# Patient Record
Sex: Female | Born: 1950 | Race: Black or African American | Hispanic: No | Smoking: Former smoker
Health system: Southern US, Community
[De-identification: ages and names within clinical notes are randomized; demographics above are authoritative.]

## PROBLEM LIST (undated history)

## (undated) DIAGNOSIS — F1911 Other psychoactive substance abuse, in remission: Secondary | ICD-10-CM

## (undated) DIAGNOSIS — G8929 Other chronic pain: Secondary | ICD-10-CM

## (undated) DIAGNOSIS — F419 Anxiety disorder, unspecified: Secondary | ICD-10-CM

## (undated) DIAGNOSIS — H269 Unspecified cataract: Secondary | ICD-10-CM

## (undated) DIAGNOSIS — K219 Gastro-esophageal reflux disease without esophagitis: Secondary | ICD-10-CM

## (undated) DIAGNOSIS — I10 Essential (primary) hypertension: Secondary | ICD-10-CM

## (undated) DIAGNOSIS — E782 Mixed hyperlipidemia: Secondary | ICD-10-CM

## (undated) DIAGNOSIS — F329 Major depressive disorder, single episode, unspecified: Secondary | ICD-10-CM

## (undated) DIAGNOSIS — Z87898 Personal history of other specified conditions: Secondary | ICD-10-CM

## (undated) DIAGNOSIS — E039 Hypothyroidism, unspecified: Secondary | ICD-10-CM

## (undated) DIAGNOSIS — F32A Depression, unspecified: Secondary | ICD-10-CM

## (undated) DIAGNOSIS — R569 Unspecified convulsions: Secondary | ICD-10-CM

## (undated) DIAGNOSIS — F309 Manic episode, unspecified: Secondary | ICD-10-CM

## (undated) DIAGNOSIS — R2689 Other abnormalities of gait and mobility: Secondary | ICD-10-CM

## (undated) DIAGNOSIS — M5416 Radiculopathy, lumbar region: Secondary | ICD-10-CM

## (undated) DIAGNOSIS — M549 Dorsalgia, unspecified: Secondary | ICD-10-CM

## (undated) DIAGNOSIS — G4730 Sleep apnea, unspecified: Secondary | ICD-10-CM

## (undated) DIAGNOSIS — M1990 Unspecified osteoarthritis, unspecified site: Secondary | ICD-10-CM

## (undated) DIAGNOSIS — E7800 Pure hypercholesterolemia, unspecified: Secondary | ICD-10-CM

## (undated) DIAGNOSIS — K5900 Constipation, unspecified: Secondary | ICD-10-CM

## (undated) DIAGNOSIS — G44009 Cluster headache syndrome, unspecified, not intractable: Secondary | ICD-10-CM

## (undated) DIAGNOSIS — F4310 Post-traumatic stress disorder, unspecified: Secondary | ICD-10-CM

## (undated) DIAGNOSIS — G894 Chronic pain syndrome: Secondary | ICD-10-CM

## (undated) HISTORY — DX: Mixed hyperlipidemia: E78.2

## (undated) HISTORY — DX: Other chronic pain: G89.29

## (undated) HISTORY — DX: Manic episode, unspecified: F30.9

## (undated) HISTORY — PX: CHOLECYSTECTOMY: SHX55

## (undated) HISTORY — DX: Personal history of other specified conditions: Z87.898

## (undated) HISTORY — DX: Unspecified cataract: H26.9

## (undated) HISTORY — PX: FOOT SURGERY: SHX648

## (undated) HISTORY — DX: Anxiety disorder, unspecified: F41.9

## (undated) HISTORY — DX: Gastro-esophageal reflux disease without esophagitis: K21.9

## (undated) HISTORY — DX: Essential (primary) hypertension: I10

## (undated) HISTORY — DX: Depression, unspecified: F32.A

## (undated) HISTORY — DX: Cluster headache syndrome, unspecified, not intractable: G44.009

## (undated) HISTORY — PX: BACK SURGERY: SHX140

## (undated) HISTORY — DX: Pure hypercholesterolemia, unspecified: E78.00

## (undated) HISTORY — PX: PARTIAL HYSTERECTOMY: SHX80

## (undated) HISTORY — PX: BILATERAL OOPHORECTOMY: SHX1221

## (undated) HISTORY — PX: OTHER SURGICAL HISTORY: SHX169

## (undated) HISTORY — DX: Other psychoactive substance abuse, in remission: F19.11

## (undated) HISTORY — DX: Dorsalgia, unspecified: M54.9

## (undated) HISTORY — DX: Post-traumatic stress disorder, unspecified: F43.10

## (undated) HISTORY — DX: Major depressive disorder, single episode, unspecified: F32.9

## (undated) HISTORY — PX: LUMBAR DISC SURGERY: SHX700

## (undated) HISTORY — DX: Hypothyroidism, unspecified: E03.9

---

## 2003-04-29 ENCOUNTER — Encounter: Payer: Self-pay | Admitting: Family Medicine

## 2003-04-29 ENCOUNTER — Ambulatory Visit (HOSPITAL_COMMUNITY): Admission: RE | Admit: 2003-04-29 | Discharge: 2003-04-29 | Payer: Self-pay | Admitting: Family Medicine

## 2003-07-13 ENCOUNTER — Ambulatory Visit (HOSPITAL_COMMUNITY): Admission: RE | Admit: 2003-07-13 | Discharge: 2003-07-13 | Payer: Self-pay | Admitting: Family Medicine

## 2003-07-16 ENCOUNTER — Inpatient Hospital Stay (HOSPITAL_COMMUNITY): Admission: AD | Admit: 2003-07-16 | Discharge: 2003-07-20 | Payer: Self-pay | Admitting: General Surgery

## 2004-04-08 ENCOUNTER — Emergency Department (HOSPITAL_COMMUNITY): Admission: EM | Admit: 2004-04-08 | Discharge: 2004-04-08 | Payer: Self-pay | Admitting: Emergency Medicine

## 2004-11-23 ENCOUNTER — Ambulatory Visit: Payer: Self-pay | Admitting: Family Medicine

## 2005-01-03 ENCOUNTER — Ambulatory Visit (HOSPITAL_COMMUNITY): Admission: RE | Admit: 2005-01-03 | Discharge: 2005-01-03 | Payer: Self-pay | Admitting: Orthopaedic Surgery

## 2005-01-04 ENCOUNTER — Ambulatory Visit: Payer: Self-pay | Admitting: Family Medicine

## 2005-02-16 ENCOUNTER — Ambulatory Visit: Payer: Self-pay | Admitting: Family Medicine

## 2005-03-01 ENCOUNTER — Ambulatory Visit: Payer: Self-pay | Admitting: Family Medicine

## 2005-03-16 ENCOUNTER — Ambulatory Visit (HOSPITAL_COMMUNITY): Admission: RE | Admit: 2005-03-16 | Discharge: 2005-03-16 | Payer: Self-pay | Admitting: Family Medicine

## 2005-05-09 ENCOUNTER — Ambulatory Visit: Payer: Self-pay | Admitting: Family Medicine

## 2005-06-18 ENCOUNTER — Emergency Department (HOSPITAL_COMMUNITY): Admission: EM | Admit: 2005-06-18 | Discharge: 2005-06-18 | Payer: Self-pay | Admitting: Emergency Medicine

## 2005-06-19 ENCOUNTER — Ambulatory Visit: Payer: Self-pay | Admitting: Family Medicine

## 2005-07-31 ENCOUNTER — Ambulatory Visit: Payer: Self-pay | Admitting: Family Medicine

## 2005-08-21 ENCOUNTER — Ambulatory Visit (HOSPITAL_COMMUNITY): Admission: RE | Admit: 2005-08-21 | Discharge: 2005-08-21 | Payer: Self-pay | Admitting: Family Medicine

## 2005-09-13 ENCOUNTER — Inpatient Hospital Stay (HOSPITAL_COMMUNITY): Admission: RE | Admit: 2005-09-13 | Discharge: 2005-09-17 | Payer: Self-pay | Admitting: Neurosurgery

## 2005-10-19 ENCOUNTER — Ambulatory Visit: Payer: Self-pay | Admitting: Family Medicine

## 2005-12-26 ENCOUNTER — Ambulatory Visit (HOSPITAL_COMMUNITY): Admission: RE | Admit: 2005-12-26 | Discharge: 2005-12-26 | Payer: Self-pay | Admitting: Neurosurgery

## 2006-02-21 ENCOUNTER — Ambulatory Visit: Payer: Self-pay | Admitting: Family Medicine

## 2006-02-22 ENCOUNTER — Ambulatory Visit (HOSPITAL_COMMUNITY): Admission: RE | Admit: 2006-02-22 | Discharge: 2006-02-22 | Payer: Self-pay | Admitting: Family Medicine

## 2006-02-23 ENCOUNTER — Ambulatory Visit (HOSPITAL_COMMUNITY): Admission: RE | Admit: 2006-02-23 | Discharge: 2006-02-23 | Payer: Self-pay | Admitting: Family Medicine

## 2006-03-21 ENCOUNTER — Ambulatory Visit (HOSPITAL_COMMUNITY): Admission: RE | Admit: 2006-03-21 | Discharge: 2006-03-21 | Payer: Self-pay | Admitting: Family Medicine

## 2006-04-11 ENCOUNTER — Encounter: Payer: Self-pay | Admitting: Family Medicine

## 2006-04-11 ENCOUNTER — Ambulatory Visit: Payer: Self-pay | Admitting: Family Medicine

## 2006-04-11 ENCOUNTER — Other Ambulatory Visit: Admission: RE | Admit: 2006-04-11 | Discharge: 2006-04-11 | Payer: Self-pay | Admitting: Family Medicine

## 2006-04-11 LAB — CONVERTED CEMR LAB: Pap Smear: NORMAL

## 2006-05-03 ENCOUNTER — Ambulatory Visit: Payer: Self-pay | Admitting: Family Medicine

## 2006-06-19 ENCOUNTER — Emergency Department (HOSPITAL_COMMUNITY): Admission: EM | Admit: 2006-06-19 | Discharge: 2006-06-19 | Payer: Self-pay | Admitting: Emergency Medicine

## 2006-06-21 ENCOUNTER — Ambulatory Visit: Payer: Self-pay | Admitting: Internal Medicine

## 2006-06-28 ENCOUNTER — Ambulatory Visit: Payer: Self-pay | Admitting: Internal Medicine

## 2006-06-28 ENCOUNTER — Encounter (INDEPENDENT_AMBULATORY_CARE_PROVIDER_SITE_OTHER): Payer: Self-pay | Admitting: Specialist

## 2006-06-28 ENCOUNTER — Ambulatory Visit (HOSPITAL_COMMUNITY): Admission: RE | Admit: 2006-06-28 | Discharge: 2006-06-28 | Payer: Self-pay | Admitting: Internal Medicine

## 2006-06-28 HISTORY — PX: COLONOSCOPY: SHX174

## 2006-06-28 HISTORY — PX: ESOPHAGOGASTRODUODENOSCOPY: SHX1529

## 2006-07-05 ENCOUNTER — Ambulatory Visit: Payer: Self-pay | Admitting: Family Medicine

## 2006-07-09 ENCOUNTER — Ambulatory Visit (HOSPITAL_COMMUNITY): Admission: RE | Admit: 2006-07-09 | Discharge: 2006-07-09 | Payer: Self-pay | Admitting: Family Medicine

## 2006-07-10 ENCOUNTER — Ambulatory Visit: Payer: Self-pay | Admitting: Cardiology

## 2006-07-11 ENCOUNTER — Inpatient Hospital Stay (HOSPITAL_COMMUNITY): Admission: EM | Admit: 2006-07-11 | Discharge: 2006-07-13 | Payer: Self-pay | Admitting: Emergency Medicine

## 2006-09-24 ENCOUNTER — Ambulatory Visit: Payer: Self-pay | Admitting: Family Medicine

## 2006-10-12 ENCOUNTER — Encounter: Payer: Self-pay | Admitting: Family Medicine

## 2006-10-12 LAB — CONVERTED CEMR LAB
ALT: 25 units/L (ref 0–35)
AST: 23 units/L (ref 0–37)
Albumin: 4.7 g/dL (ref 3.5–5.2)
Alkaline Phosphatase: 71 units/L (ref 39–117)
BUN: 10 mg/dL (ref 6–23)
Basophils Absolute: 0 10*3/uL (ref 0.0–0.1)
Basophils Relative: 0 % (ref 0–1)
Bilirubin, Direct: 0.1 mg/dL (ref 0.0–0.3)
CO2: 27 meq/L (ref 19–32)
Calcium: 9.1 mg/dL (ref 8.4–10.5)
Chloride: 102 meq/L (ref 96–112)
Cholesterol: 243 mg/dL — ABNORMAL HIGH (ref 0–200)
Creatinine, Ser: 1.16 mg/dL (ref 0.40–1.20)
Eosinophils Absolute: 0.1 10*3/uL (ref 0.0–0.7)
Eosinophils Relative: 1 % (ref 0–5)
Glucose, Bld: 114 mg/dL — ABNORMAL HIGH (ref 70–99)
HCT: 47.1 % — ABNORMAL HIGH (ref 36.0–46.0)
HDL: 58 mg/dL (ref >39)
Hemoglobin: 14.7 g/dL (ref 12.0–15.0)
Indirect Bilirubin: 0.4 mg/dL (ref 0.0–0.9)
LDL Cholesterol: 163 mg/dL — ABNORMAL HIGH (ref 0–99)
Lymphocytes Relative: 40 % (ref 12–46)
Lymphs Abs: 4 10*3/uL — ABNORMAL HIGH (ref 0.7–3.3)
MCHC: 31.2 g/dL (ref 30.0–36.0)
MCV: 85.6 fL (ref 78.0–100.0)
Monocytes Absolute: 0.4 10*3/uL (ref 0.2–0.7)
Monocytes Relative: 4 % (ref 3–11)
Neutro Abs: 5.5 10*3/uL (ref 1.7–7.7)
Neutrophils Relative %: 54 % (ref 43–77)
Platelets: 255 10*3/uL (ref 150–400)
Potassium: 3.7 meq/L (ref 3.5–5.3)
RBC: 5.5 M/uL — ABNORMAL HIGH (ref 3.87–5.11)
RDW: 14.9 % — ABNORMAL HIGH (ref 11.5–14.0)
Sodium: 141 meq/L (ref 135–145)
TSH: 21.401 microintl units/mL — ABNORMAL HIGH (ref 0.350–5.50)
Total Bilirubin: 0.5 mg/dL (ref 0.3–1.2)
Total CHOL/HDL Ratio: 4.2
Total Protein: 8.4 g/dL — ABNORMAL HIGH (ref 6.0–8.3)
Triglycerides: 109 mg/dL (ref <150)
VLDL: 22 mg/dL (ref 0–40)
WBC: 10.1 10*3/uL (ref 4.0–10.5)

## 2006-10-15 ENCOUNTER — Encounter: Payer: Self-pay | Admitting: Family Medicine

## 2006-10-15 LAB — CONVERTED CEMR LAB: Hgb A1c MFr Bld: 5.3 % (ref 4.6–6.1)

## 2006-10-18 ENCOUNTER — Ambulatory Visit: Payer: Self-pay | Admitting: Family Medicine

## 2006-11-14 ENCOUNTER — Ambulatory Visit: Payer: Self-pay | Admitting: Family Medicine

## 2006-11-27 ENCOUNTER — Ambulatory Visit: Payer: Self-pay | Admitting: Internal Medicine

## 2006-11-28 ENCOUNTER — Ambulatory Visit (HOSPITAL_COMMUNITY): Admission: RE | Admit: 2006-11-28 | Discharge: 2006-11-28 | Payer: Self-pay | Admitting: Internal Medicine

## 2006-12-11 ENCOUNTER — Encounter: Payer: Self-pay | Admitting: Family Medicine

## 2006-12-11 LAB — CONVERTED CEMR LAB: TSH: 3.45 microintl units/mL (ref 0.350–5.50)

## 2007-01-14 ENCOUNTER — Ambulatory Visit: Payer: Self-pay | Admitting: Family Medicine

## 2007-03-12 ENCOUNTER — Ambulatory Visit: Payer: Self-pay | Admitting: Psychiatry

## 2007-03-12 ENCOUNTER — Inpatient Hospital Stay (HOSPITAL_COMMUNITY): Admission: AD | Admit: 2007-03-12 | Discharge: 2007-03-15 | Payer: Self-pay | Admitting: Psychiatry

## 2007-05-06 ENCOUNTER — Ambulatory Visit: Payer: Self-pay | Admitting: Family Medicine

## 2007-05-06 LAB — CONVERTED CEMR LAB: Hgb A1c MFr Bld: 5.7 %

## 2007-05-09 ENCOUNTER — Ambulatory Visit (HOSPITAL_COMMUNITY): Admission: RE | Admit: 2007-05-09 | Discharge: 2007-05-09 | Payer: Self-pay | Admitting: Family Medicine

## 2007-05-09 ENCOUNTER — Encounter: Payer: Self-pay | Admitting: Family Medicine

## 2007-05-09 LAB — CONVERTED CEMR LAB
ALT: 18 units/L (ref 0–35)
AST: 16 units/L (ref 0–37)
Albumin: 3.7 g/dL (ref 3.5–5.2)
Alkaline Phosphatase: 80 units/L (ref 39–117)
BUN: 9 mg/dL (ref 6–23)
Bilirubin, Direct: <0.1 mg/dL (ref 0.0–0.3)
CO2: 25 meq/L (ref 19–32)
Calcium: 8.4 mg/dL (ref 8.4–10.5)
Chloride: 107 meq/L (ref 96–112)
Cholesterol: 121 mg/dL (ref 0–200)
Creatinine, Ser: 0.64 mg/dL (ref 0.40–1.20)
Glucose, Bld: 98 mg/dL (ref 70–99)
HDL: 47 mg/dL (ref >39)
LDL Cholesterol: 59 mg/dL (ref 0–99)
Potassium: 4 meq/L (ref 3.5–5.3)
Sodium: 142 meq/L (ref 135–145)
TSH: 0.018 microintl units/mL — ABNORMAL LOW (ref 0.350–5.50)
Total Bilirubin: 0.3 mg/dL (ref 0.3–1.2)
Total CHOL/HDL Ratio: 2.6
Total Protein: 6.9 g/dL (ref 6.0–8.3)
Triglycerides: 75 mg/dL (ref <150)
VLDL: 15 mg/dL (ref 0–40)

## 2007-06-10 ENCOUNTER — Ambulatory Visit (HOSPITAL_COMMUNITY): Payer: Self-pay | Admitting: Psychiatry

## 2007-06-18 ENCOUNTER — Ambulatory Visit (HOSPITAL_COMMUNITY): Payer: Self-pay | Admitting: Psychiatry

## 2007-06-24 ENCOUNTER — Ambulatory Visit: Payer: Self-pay | Admitting: Family Medicine

## 2007-06-24 LAB — CONVERTED CEMR LAB: TSH: 0.331 microintl units/mL — ABNORMAL LOW (ref 0.350–5.50)

## 2007-07-30 ENCOUNTER — Ambulatory Visit (HOSPITAL_COMMUNITY): Payer: Self-pay | Admitting: Psychiatry

## 2007-08-14 ENCOUNTER — Ambulatory Visit (HOSPITAL_COMMUNITY): Payer: Self-pay | Admitting: Psychiatry

## 2007-08-20 ENCOUNTER — Ambulatory Visit (HOSPITAL_COMMUNITY): Payer: Self-pay | Admitting: Psychiatry

## 2007-09-12 ENCOUNTER — Encounter: Payer: Self-pay | Admitting: Family Medicine

## 2007-09-19 ENCOUNTER — Ambulatory Visit (HOSPITAL_COMMUNITY): Payer: Self-pay | Admitting: Psychiatry

## 2007-09-20 ENCOUNTER — Ambulatory Visit (HOSPITAL_COMMUNITY): Payer: Self-pay | Admitting: Psychiatry

## 2007-11-14 ENCOUNTER — Ambulatory Visit (HOSPITAL_COMMUNITY): Payer: Self-pay | Admitting: Psychiatry

## 2008-01-14 ENCOUNTER — Ambulatory Visit (HOSPITAL_COMMUNITY): Payer: Self-pay | Admitting: Psychiatry

## 2008-02-24 ENCOUNTER — Ambulatory Visit: Payer: Self-pay | Admitting: Family Medicine

## 2008-02-24 LAB — CONVERTED CEMR LAB
ALT: 13 units/L (ref 0–35)
AST: 15 units/L (ref 0–37)
Albumin: 3.9 g/dL (ref 3.5–5.2)
Alkaline Phosphatase: 67 units/L (ref 39–117)
BUN: 18 mg/dL (ref 6–23)
Basophils Absolute: 0 10*3/uL (ref 0.0–0.1)
Basophils Relative: 0 % (ref 0–1)
Bilirubin, Direct: <0.1 mg/dL (ref 0.0–0.3)
Blood Glucose, Fasting: 115 mg/dL
CO2: 25 meq/L (ref 19–32)
Calcium: 9 mg/dL (ref 8.4–10.5)
Chloride: 105 meq/L (ref 96–112)
Cholesterol: 173 mg/dL (ref 0–200)
Creatinine, Ser: 0.78 mg/dL (ref 0.40–1.20)
Eosinophils Absolute: 0.1 10*3/uL (ref 0.0–0.7)
Eosinophils Relative: 1 % (ref 0–5)
Glucose, Bld: 115 mg/dL — ABNORMAL HIGH (ref 70–99)
HCT: 38.8 % (ref 36.0–46.0)
HDL: 46 mg/dL (ref >39)
Hemoglobin: 12.4 g/dL (ref 12.0–15.0)
LDL Cholesterol: 90 mg/dL (ref 0–99)
Lymphocytes Relative: 49 % — ABNORMAL HIGH (ref 12–46)
Lymphs Abs: 2.6 10*3/uL (ref 0.7–4.0)
MCHC: 32 g/dL (ref 30.0–36.0)
MCV: 84.3 fL (ref 78.0–100.0)
Monocytes Absolute: 0.4 10*3/uL (ref 0.1–1.0)
Monocytes Relative: 7 % (ref 3–12)
Neutro Abs: 2.3 10*3/uL (ref 1.7–7.7)
Neutrophils Relative %: 43 % (ref 43–77)
Platelets: 198 10*3/uL (ref 150–400)
Potassium: 3.7 meq/L (ref 3.5–5.3)
RBC count: 4.6 10*6/uL
RBC: 4.6 M/uL (ref 3.87–5.11)
RDW: 14.2 % (ref 11.5–15.5)
Sodium: 140 meq/L (ref 135–145)
TSH: 1.451 microintl units/mL
TSH: 1.451 microintl units/mL (ref 0.350–5.50)
Total Bilirubin: 0.3 mg/dL (ref 0.3–1.2)
Total CHOL/HDL Ratio: 3.8
Total Protein: 7.3 g/dL (ref 6.0–8.3)
Triglycerides: 185 mg/dL — ABNORMAL HIGH (ref <150)
VLDL: 37 mg/dL (ref 0–40)
WBC, blood: 5.3 10*3/uL
WBC: 5.3 10*3/uL (ref 4.0–10.5)

## 2008-02-25 ENCOUNTER — Encounter: Payer: Self-pay | Admitting: Family Medicine

## 2008-02-25 LAB — CONVERTED CEMR LAB: Hgb A1c MFr Bld: 5.3 % (ref 4.6–6.1)

## 2008-02-27 ENCOUNTER — Encounter: Payer: Self-pay | Admitting: Family Medicine

## 2008-02-27 DIAGNOSIS — F3130 Bipolar disorder, current episode depressed, mild or moderate severity, unspecified: Secondary | ICD-10-CM | POA: Insufficient documentation

## 2008-02-27 DIAGNOSIS — E785 Hyperlipidemia, unspecified: Secondary | ICD-10-CM | POA: Insufficient documentation

## 2008-02-27 DIAGNOSIS — J309 Allergic rhinitis, unspecified: Secondary | ICD-10-CM | POA: Insufficient documentation

## 2008-02-27 DIAGNOSIS — R51 Headache: Secondary | ICD-10-CM | POA: Insufficient documentation

## 2008-02-27 DIAGNOSIS — F418 Other specified anxiety disorders: Secondary | ICD-10-CM | POA: Insufficient documentation

## 2008-02-27 DIAGNOSIS — R519 Headache, unspecified: Secondary | ICD-10-CM | POA: Insufficient documentation

## 2008-02-27 DIAGNOSIS — I10 Essential (primary) hypertension: Secondary | ICD-10-CM | POA: Insufficient documentation

## 2008-02-27 DIAGNOSIS — K219 Gastro-esophageal reflux disease without esophagitis: Secondary | ICD-10-CM | POA: Insufficient documentation

## 2008-02-27 DIAGNOSIS — E039 Hypothyroidism, unspecified: Secondary | ICD-10-CM | POA: Insufficient documentation

## 2008-02-27 DIAGNOSIS — F411 Generalized anxiety disorder: Secondary | ICD-10-CM

## 2008-03-17 ENCOUNTER — Ambulatory Visit (HOSPITAL_COMMUNITY): Payer: Self-pay | Admitting: Psychiatry

## 2008-07-13 ENCOUNTER — Encounter: Payer: Self-pay | Admitting: Family Medicine

## 2008-07-16 ENCOUNTER — Ambulatory Visit (HOSPITAL_COMMUNITY): Payer: Self-pay | Admitting: Psychiatry

## 2008-07-29 ENCOUNTER — Ambulatory Visit (HOSPITAL_COMMUNITY): Payer: Self-pay | Admitting: Psychiatry

## 2008-08-13 ENCOUNTER — Ambulatory Visit (HOSPITAL_COMMUNITY): Payer: Self-pay | Admitting: Psychiatry

## 2008-08-26 ENCOUNTER — Ambulatory Visit (HOSPITAL_COMMUNITY): Payer: Self-pay | Admitting: Psychiatry

## 2008-09-09 ENCOUNTER — Ambulatory Visit: Payer: Self-pay | Admitting: Family Medicine

## 2008-09-09 DIAGNOSIS — E739 Lactose intolerance, unspecified: Secondary | ICD-10-CM | POA: Insufficient documentation

## 2008-09-09 DIAGNOSIS — IMO0002 Reserved for concepts with insufficient information to code with codable children: Secondary | ICD-10-CM

## 2008-09-09 DIAGNOSIS — G8929 Other chronic pain: Secondary | ICD-10-CM | POA: Insufficient documentation

## 2008-09-09 LAB — CONVERTED CEMR LAB: Hgb A1c MFr Bld: 5.5 %

## 2008-09-14 LAB — CONVERTED CEMR LAB
ALT: 16 units/L (ref 0–35)
AST: 18 units/L (ref 0–37)
Albumin: 3.9 g/dL (ref 3.5–5.2)
Alkaline Phosphatase: 61 units/L (ref 39–117)
BUN: 14 mg/dL (ref 6–23)
Bilirubin, Direct: 0.1 mg/dL (ref 0.0–0.3)
CO2: 26 meq/L (ref 19–32)
Calcium: 9.2 mg/dL (ref 8.4–10.5)
Chloride: 103 meq/L (ref 96–112)
Cholesterol: 182 mg/dL (ref 0–200)
Creatinine, Ser: 0.87 mg/dL (ref 0.40–1.20)
Glucose, Bld: 94 mg/dL (ref 70–99)
HDL: 51 mg/dL (ref >39)
Indirect Bilirubin: 0.3 mg/dL (ref 0.0–0.9)
LDL Cholesterol: 102 mg/dL — ABNORMAL HIGH (ref 0–99)
Potassium: 4.1 meq/L (ref 3.5–5.3)
Sodium: 141 meq/L (ref 135–145)
TSH: 7.199 microintl units/mL — ABNORMAL HIGH (ref 0.350–4.50)
Total Bilirubin: 0.4 mg/dL (ref 0.3–1.2)
Total CHOL/HDL Ratio: 3.6
Total Protein: 7.4 g/dL (ref 6.0–8.3)
Triglycerides: 144 mg/dL (ref <150)
VLDL: 29 mg/dL (ref 0–40)

## 2008-09-15 ENCOUNTER — Ambulatory Visit (HOSPITAL_COMMUNITY): Payer: Self-pay | Admitting: Psychiatry

## 2008-09-24 ENCOUNTER — Encounter: Payer: Self-pay | Admitting: Family Medicine

## 2008-09-29 ENCOUNTER — Ambulatory Visit: Payer: Self-pay | Admitting: Internal Medicine

## 2008-10-16 ENCOUNTER — Ambulatory Visit: Payer: Self-pay | Admitting: Internal Medicine

## 2008-10-16 ENCOUNTER — Ambulatory Visit (HOSPITAL_COMMUNITY): Admission: RE | Admit: 2008-10-16 | Discharge: 2008-10-16 | Payer: Self-pay | Admitting: Internal Medicine

## 2008-10-16 HISTORY — PX: COLONOSCOPY: SHX174

## 2008-10-16 HISTORY — PX: ESOPHAGOGASTRODUODENOSCOPY: SHX1529

## 2008-10-19 ENCOUNTER — Encounter: Payer: Self-pay | Admitting: Family Medicine

## 2008-10-21 ENCOUNTER — Other Ambulatory Visit: Admission: RE | Admit: 2008-10-21 | Discharge: 2008-10-21 | Payer: Self-pay | Admitting: Family Medicine

## 2008-10-21 ENCOUNTER — Encounter: Payer: Self-pay | Admitting: Family Medicine

## 2008-10-21 ENCOUNTER — Ambulatory Visit: Payer: Self-pay | Admitting: Family Medicine

## 2008-10-21 DIAGNOSIS — L259 Unspecified contact dermatitis, unspecified cause: Secondary | ICD-10-CM | POA: Insufficient documentation

## 2008-11-12 ENCOUNTER — Ambulatory Visit: Admission: RE | Admit: 2008-11-12 | Discharge: 2008-11-12 | Payer: Self-pay | Admitting: Neurology

## 2008-12-15 ENCOUNTER — Encounter: Payer: Self-pay | Admitting: Family Medicine

## 2009-01-12 ENCOUNTER — Ambulatory Visit (HOSPITAL_COMMUNITY): Payer: Self-pay | Admitting: Psychiatry

## 2009-03-09 ENCOUNTER — Ambulatory Visit (HOSPITAL_COMMUNITY): Payer: Self-pay | Admitting: Psychiatry

## 2009-03-10 ENCOUNTER — Ambulatory Visit: Payer: Self-pay | Admitting: Family Medicine

## 2009-03-10 DIAGNOSIS — N3000 Acute cystitis without hematuria: Secondary | ICD-10-CM | POA: Insufficient documentation

## 2009-03-10 LAB — CONVERTED CEMR LAB
Bilirubin Urine: NEGATIVE
Blood in Urine, dipstick: NEGATIVE
Glucose, Urine, Semiquant: NEGATIVE
Ketones, urine, test strip: NEGATIVE
Nitrite: NEGATIVE
Specific Gravity, Urine: 1.02
Urobilinogen, UA: 0.2
pH: 6.5

## 2009-03-11 ENCOUNTER — Encounter: Payer: Self-pay | Admitting: Family Medicine

## 2009-03-11 LAB — CONVERTED CEMR LAB
ALT: 15 units/L (ref 0–35)
AST: 19 units/L (ref 0–37)
Albumin: 4.2 g/dL (ref 3.5–5.2)
Alkaline Phosphatase: 62 units/L (ref 39–117)
BUN: 12 mg/dL (ref 6–23)
Bilirubin, Direct: 0.1 mg/dL (ref 0.0–0.3)
CO2: 24 meq/L (ref 19–32)
Calcium: 8.5 mg/dL (ref 8.4–10.5)
Chloride: 106 meq/L (ref 96–112)
Cholesterol: 97 mg/dL (ref 0–200)
Creatinine, Ser: 0.79 mg/dL (ref 0.40–1.20)
Glucose, Bld: 94 mg/dL (ref 70–99)
HDL: 48 mg/dL (ref >39)
Indirect Bilirubin: 0.3 mg/dL (ref 0.0–0.9)
LDL Cholesterol: 29 mg/dL (ref 0–99)
Potassium: 4 meq/L (ref 3.5–5.3)
Sodium: 143 meq/L (ref 135–145)
TSH: 0.48 microintl units/mL (ref 0.350–4.500)
Total Bilirubin: 0.4 mg/dL (ref 0.3–1.2)
Total CHOL/HDL Ratio: 2
Total Protein: 7.5 g/dL (ref 6.0–8.3)
Triglycerides: 98 mg/dL (ref <150)
VLDL: 20 mg/dL (ref 0–40)

## 2009-05-26 ENCOUNTER — Ambulatory Visit: Payer: Self-pay | Admitting: Family Medicine

## 2009-05-26 DIAGNOSIS — M79609 Pain in unspecified limb: Secondary | ICD-10-CM | POA: Insufficient documentation

## 2009-06-22 ENCOUNTER — Ambulatory Visit (HOSPITAL_COMMUNITY): Payer: Self-pay | Admitting: Psychiatry

## 2009-06-29 ENCOUNTER — Telehealth: Payer: Self-pay | Admitting: Family Medicine

## 2009-07-12 ENCOUNTER — Ambulatory Visit: Payer: Self-pay | Admitting: Family Medicine

## 2009-07-12 LAB — CONVERTED CEMR LAB
Bilirubin Urine: NEGATIVE
Blood in Urine, dipstick: NEGATIVE
Glucose, Urine, Semiquant: NEGATIVE
Ketones, urine, test strip: NEGATIVE
Nitrite: NEGATIVE
Protein, U semiquant: 30
Specific Gravity, Urine: 1.025
Urobilinogen, UA: 4
pH: 7

## 2009-07-13 ENCOUNTER — Encounter: Payer: Self-pay | Admitting: Family Medicine

## 2009-07-13 LAB — CONVERTED CEMR LAB
ALT: 14 units/L (ref 0–35)
AST: 19 units/L (ref 0–37)
Albumin: 4.2 g/dL (ref 3.5–5.2)
Alkaline Phosphatase: 74 units/L (ref 39–117)
BUN: 12 mg/dL (ref 6–23)
Basophils Absolute: 0 10*3/uL (ref 0.0–0.1)
Basophils Relative: 0 % (ref 0–1)
Bilirubin, Direct: 0.1 mg/dL (ref 0.0–0.3)
CO2: 27 meq/L (ref 19–32)
Calcium: 9.2 mg/dL (ref 8.4–10.5)
Chloride: 102 meq/L (ref 96–112)
Cholesterol: 138 mg/dL (ref 0–200)
Creatinine, Ser: 0.67 mg/dL (ref 0.40–1.20)
Eosinophils Absolute: 0.1 10*3/uL (ref 0.0–0.7)
Eosinophils Relative: 1 % (ref 0–5)
Glucose, Bld: 110 mg/dL — ABNORMAL HIGH (ref 70–99)
HCT: 39.9 % (ref 36.0–46.0)
HDL: 49 mg/dL (ref >39)
Hemoglobin: 12.5 g/dL (ref 12.0–15.0)
Hgb A1c MFr Bld: 5.7 % (ref 4.6–6.1)
Indirect Bilirubin: 0.3 mg/dL (ref 0.0–0.9)
LDL Cholesterol: 71 mg/dL (ref 0–99)
Lymphocytes Relative: 37 % (ref 12–46)
Lymphs Abs: 2.2 10*3/uL (ref 0.7–4.0)
MCHC: 31.3 g/dL (ref 30.0–36.0)
MCV: 84.9 fL (ref 78.0–100.0)
Monocytes Absolute: 0.5 10*3/uL (ref 0.1–1.0)
Monocytes Relative: 9 % (ref 3–12)
Neutro Abs: 3.1 10*3/uL (ref 1.7–7.7)
Neutrophils Relative %: 53 % (ref 43–77)
Platelets: 175 10*3/uL (ref 150–400)
Potassium: 3.7 meq/L (ref 3.5–5.3)
RBC: 4.7 M/uL (ref 3.87–5.11)
RDW: 13.8 % (ref 11.5–15.5)
Sodium: 141 meq/L (ref 135–145)
TSH: 0.434 microintl units/mL (ref 0.350–4.500)
Total Bilirubin: 0.4 mg/dL (ref 0.3–1.2)
Total CHOL/HDL Ratio: 2.8
Total Protein: 7.5 g/dL (ref 6.0–8.3)
Triglycerides: 90 mg/dL (ref <150)
VLDL: 18 mg/dL (ref 0–40)
WBC: 5.9 10*3/uL (ref 4.0–10.5)

## 2009-07-14 ENCOUNTER — Ambulatory Visit (HOSPITAL_COMMUNITY): Payer: Self-pay | Admitting: Psychiatry

## 2009-07-18 DIAGNOSIS — N39498 Other specified urinary incontinence: Secondary | ICD-10-CM | POA: Insufficient documentation

## 2009-07-22 ENCOUNTER — Ambulatory Visit (HOSPITAL_COMMUNITY): Payer: Self-pay | Admitting: Psychiatry

## 2009-08-13 ENCOUNTER — Ambulatory Visit (HOSPITAL_COMMUNITY): Payer: Self-pay | Admitting: Psychiatry

## 2009-08-19 ENCOUNTER — Ambulatory Visit (HOSPITAL_COMMUNITY): Payer: Self-pay | Admitting: Psychiatry

## 2009-09-29 ENCOUNTER — Encounter: Payer: Self-pay | Admitting: Family Medicine

## 2009-09-30 ENCOUNTER — Ambulatory Visit (HOSPITAL_COMMUNITY): Payer: Self-pay | Admitting: Psychiatry

## 2009-10-19 ENCOUNTER — Ambulatory Visit (HOSPITAL_COMMUNITY): Payer: Self-pay | Admitting: Psychiatry

## 2009-11-01 ENCOUNTER — Telehealth: Payer: Self-pay | Admitting: Family Medicine

## 2009-11-04 ENCOUNTER — Encounter (INDEPENDENT_AMBULATORY_CARE_PROVIDER_SITE_OTHER): Payer: Self-pay | Admitting: *Deleted

## 2009-11-05 ENCOUNTER — Telehealth: Payer: Self-pay | Admitting: Family Medicine

## 2009-11-25 ENCOUNTER — Ambulatory Visit (HOSPITAL_COMMUNITY): Payer: Self-pay | Admitting: Psychiatry

## 2009-12-07 ENCOUNTER — Ambulatory Visit: Payer: Self-pay | Admitting: Family Medicine

## 2009-12-07 ENCOUNTER — Other Ambulatory Visit: Admission: RE | Admit: 2009-12-07 | Discharge: 2009-12-07 | Payer: Self-pay | Admitting: Family Medicine

## 2009-12-07 LAB — CONVERTED CEMR LAB
Bilirubin Urine: NEGATIVE
Glucose, Urine, Semiquant: NEGATIVE
Nitrite: NEGATIVE
Pap Smear: NEGATIVE
Protein, U semiquant: NEGATIVE
Specific Gravity, Urine: 1.02
Urobilinogen, UA: 4
pH: 7

## 2009-12-08 ENCOUNTER — Encounter: Payer: Self-pay | Admitting: Family Medicine

## 2009-12-10 ENCOUNTER — Encounter: Payer: Self-pay | Admitting: Family Medicine

## 2009-12-14 ENCOUNTER — Ambulatory Visit (HOSPITAL_COMMUNITY): Admission: RE | Admit: 2009-12-14 | Discharge: 2009-12-14 | Payer: Self-pay | Admitting: Family Medicine

## 2009-12-14 DIAGNOSIS — J209 Acute bronchitis, unspecified: Secondary | ICD-10-CM | POA: Insufficient documentation

## 2009-12-17 ENCOUNTER — Telehealth: Payer: Self-pay | Admitting: Family Medicine

## 2009-12-21 ENCOUNTER — Ambulatory Visit: Payer: Self-pay | Admitting: Family Medicine

## 2009-12-21 DIAGNOSIS — L509 Urticaria, unspecified: Secondary | ICD-10-CM | POA: Insufficient documentation

## 2009-12-21 DIAGNOSIS — J019 Acute sinusitis, unspecified: Secondary | ICD-10-CM | POA: Insufficient documentation

## 2009-12-22 ENCOUNTER — Telehealth: Payer: Self-pay | Admitting: Physician Assistant

## 2009-12-25 ENCOUNTER — Emergency Department (HOSPITAL_COMMUNITY): Admission: EM | Admit: 2009-12-25 | Discharge: 2009-12-25 | Payer: Self-pay | Admitting: Emergency Medicine

## 2010-01-10 ENCOUNTER — Ambulatory Visit: Payer: Self-pay | Admitting: Family Medicine

## 2010-01-10 ENCOUNTER — Telehealth: Payer: Self-pay | Admitting: Family Medicine

## 2010-01-10 LAB — CONVERTED CEMR LAB
Bilirubin Urine: NEGATIVE
Blood in Urine, dipstick: NEGATIVE
Glucose, Urine, Semiquant: NEGATIVE
Ketones, urine, test strip: NEGATIVE
Nitrite: NEGATIVE
Protein, U semiquant: NEGATIVE
Specific Gravity, Urine: 1.02
Urobilinogen, UA: 0.2
pH: 6

## 2010-01-11 ENCOUNTER — Encounter: Payer: Self-pay | Admitting: Family Medicine

## 2010-04-13 ENCOUNTER — Telehealth: Payer: Self-pay | Admitting: Family Medicine

## 2010-09-26 ENCOUNTER — Telehealth: Payer: Self-pay | Admitting: Family Medicine

## 2010-10-02 ENCOUNTER — Encounter: Payer: Self-pay | Admitting: Family Medicine

## 2010-10-02 ENCOUNTER — Encounter: Payer: Self-pay | Admitting: Neurosurgery

## 2010-10-11 NOTE — Progress Notes (Signed)
  Phone Note Call from Patient   Summary of Call: Insurance will not pay for Detrol LA 4mg . Please change to Oxybutnin ER, Vesicare, Oxytrol, Or Enablex Initial call taken by: Everitt Amber LPN,  November 05, 2009 1:17 PM  Follow-up for Phone Call        script changed to oxybutynin pls stamp and fax to d/c thje detrol and notify pt if possible also Follow-up by: Syliva Overman MD,  November 05, 2009 1:33 PM  Additional Follow-up for Phone Call Additional follow up Details #1::        d/c order sent  Additional Follow-up by: Everitt Amber LPN,  November 08, 2009 9:34 AM    New/Updated Medications: OXYBUTYNIN CHLORIDE 5 MG TABS (OXYBUTYNIN CHLORIDE) Take 1 tablet by mouth two times a day Prescriptions: OXYBUTYNIN CHLORIDE 5 MG TABS (OXYBUTYNIN CHLORIDE) Take 1 tablet by mouth two times a day  #60 x 3   Entered and Authorized by:   Syliva Overman MD   Signed by:   Syliva Overman MD on 11/05/2009   Method used:   Printed then faxed to ...       [facility] (retail)       [address]       [place], [place]  [address]       Ph: [phone]       Fax: [phone]   RxID:   [id number]

## 2010-10-11 NOTE — Progress Notes (Signed)
[facility]: [facility]   Imported By: Lind Guest 09/29/2009 08:34:28  _____________________________________________________________________  External Attachment:    Type:   Image     Comment:   External Document

## 2010-10-11 NOTE — Assessment & Plan Note (Signed)
Summary: [facility]ASH- room 1   Vital Signs:  Patient profile:   60 year old female Menstrual status:  hysterectomy Height:      63 inches Weight:      242.25 pounds BMI:     43.07 O2 Sat:      89 % on Room air Pulse rate:   89 / minute Resp:     16 per minute BP sitting:   100 / 60  (left arm)  Vitals Entered By: Adella Hare LPN (December 21, 2009 11:12 AM) CC: red raised rash all over, itches Is Patient Diabetic? No Pain Assessment Patient in pain? no        CC:  red raised rash all over and itches.  History of Present Illness: Pt here today with an itchy rash.  This started yesterday & was worse when she awoke this am.  She has been taking PCN for sinus congestion.  Has a couple doses left.  No difficulty breathing or throat swelling.  Has not tried taking anything for this. Nor using topical cream.  States her sinus congestion is better but not completely gone.  No fever or chills.  Cough is better.  Denies chest congestion today.  No ear pain or sore thrt.      Current Medications (verified): 1)  Klor-Con M20 20 Meq  Tbcr (Potassium Chloride Crys Cr) .... Take One Tablet By Mouth Once A Day 2)  Crestor 40 Mg  Tabs (Rosuvastatin Calcium) .... Take Oe Tablet By Mouth Once A Day 3)  Miralax   Powd (Polyethylene Glycol 3350) .... As Directed 4)  Fluticasone Propionate 50 Mcg/act  Susp (Fluticasone Propionate) .... Inhale One Puff in Each Nostril Twice A Day 5)  Oscal 500/200 D-3 500-200 Mg-Unit Tabs (Calcium-Vitamin D) .... One Tab By Mouth Tid 6)  Gabapentin 300 Mg Caps (Gabapentin) .... Take 1 Tablet By Mouth Once A Day 7)  Flexeril 10 Mg Tabs (Cyclobenzaprine Hcl) .... Take 1 Tab By Mouth At Bedtime 8)  Vicodin Es 7.5-750 Mg Tabs (Hydrocodone-Acetaminophen) .... Take 1 Tab By Mouth At Bedtime 9)  Prozac 10 Mg Tabs (Fluoxetine Hcl) .... Take 1 Tablet By Mouth Once A Day 10)  Catapres 0.2 Mg Tabs (Clonidine Hcl) .... Take 1 Tab By Mouth At Bedtime 11)  Omeprazole 20 Mg Tbec  (Omeprazole) .... Take 1 Tablet By Mouth Once A Day 12)  Levothroid 150 Mcg Tabs (Levothyroxine Sodium) .... One and Half Tabs By Mouth  Every Mon - Thurs and One Tab By Mouth On Fri,sat,sun 13)  Amitriptyline Hcl 100 Mg Tabs (Amitriptyline Hcl) .Marland Kitchen.. 1-2 Tabs At Bedtime 14)  Depakote Er 250 Mg Xr24h-Tab (Divalproex Sodium) .... Take 1 Tab By Mouth At Bedtime 15)  Oxybutynin Chloride 5 Mg Tabs (Oxybutynin Chloride) .... Take 1 Tablet By Mouth Two Times A Day 16)  Tessalon Perles 100 Mg Caps (Benzonatate) .... Take 1 Capsule By Mouth Three Times A Day 17)  Penicillin V Potassium 500 Mg Tabs (Penicillin V Potassium) .... Take 1 Tablet By Mouth Three Times A Day 18)  Vitamin D (Ergocalciferol) 50000 Unit Caps (Ergocalciferol) .... One Cap By Mouth Every Week  Allergies (verified): 1)  ! Sulfa 2)  ! Neosporin 3)  ! Pcn  Past History:  Past medical history reviewed for relevance to current acute and chronic problems.  Past Medical History: Reviewed history from 02/27/2008 and no changes required. Allergic rhinitis Anxiety Depression GERD Headache Hyperlipidemia Hypertension Hypothyroidism Chronic back pain  Review of Systems General:  Denies chills and fever. Eyes:  Denies discharge and itching. ENT:  Complains of nasal congestion; denies earache, sinus pressure, and sore throat. CV:  Denies chest pain or discomfort. Resp:  Denies cough, shortness of breath, sputum productive, and wheezing. Derm:  Complains of itching and rash.  Physical Exam  General:  alert, well-developed, well-nourished, well-hydrated, and cooperative to examination.   Head:  Normocephalic and atraumatic without obvious abnormalities. No apparent alopecia or balding. Eyes:  pupils equal, pupils round, pupils reactive to light, and no injection.  Hives noted Rt lateral eye area, including lower lid. Ears:  External ear exam shows no significant lesions or deformities.  Otoscopic examination reveals clear  canals, tympanic membranes are intact bilaterally without bulging, retraction, inflammation or discharge. Hearing is grossly normal bilaterally. Nose:  no external deformity, no mucosal edema, and mucosal erythema.  Yellowish dried mucus bilat nares. Mouth:  Oral mucosa and oropharynx without lesions or exudates.  Neck:  No deformities, masses, or tenderness noted. Lungs:  Normal respiratory effort, chest expands symmetrically. Lungs are clear to auscultation, no crackles or wheezes. Heart:  Normal rate and regular rhythm. S1 and S2 normal without gallop, murmur, click, rub or other extra sounds. Skin:  Lg erythemtous irrg welts noted thorax & UE bilat. Smaller similar lesions seen face & neck. Cervical Nodes:  No lymphadenopathy noted Psych:  Cognition and judgment appear intact. Alert and cooperative with normal attention span and concentration. No apparent delusions, illusions, hallucinations   Impression & Recommendations:  Problem # 1:  URTICARIA (ICD-708.9) Assessment New  Allergic rxn to PCN. Advised pt to d/c this antibiotics & to remember that she is allergic to this in the future. Chart also updated with PCN allergy.  Orders: Depo- Medrol 80mg  (J1040) Admin of Therapeutic Inj  intramuscular or subcutaneous (04540)  Problem # 2:  SINUSITIS, ACUTE (ICD-461.9)  The following medications were removed from the medication list:    Penicillin V Potassium 500 Mg Tabs (Penicillin v potassium) .Marland Kitchen... Take 1 tablet by mouth three times a day Her updated medication list for this problem includes:    Fluticasone Propionate 50 Mcg/act Susp (Fluticasone propionate) ..... Inhale one puff in each nostril twice a day    Tessalon Perles 100 Mg Caps (Benzonatate) .Marland Kitchen... Take 1 capsule by mouth three times a day    Zithromax Z-pak 250 Mg Tabs (Azithromycin) .Marland Kitchen... Take once daily as directed  Complete Medication List: 1)  Klor-con M20 20 Meq Tbcr (Potassium chloride crys cr) .... Take one tablet  by mouth once a day 2)  Crestor 40 Mg Tabs (Rosuvastatin calcium) .... Take oe tablet by mouth once a day 3)  Miralax Powd (Polyethylene glycol 3350) .... As directed 4)  Fluticasone Propionate 50 Mcg/act Susp (Fluticasone propionate) .... Inhale one puff in each nostril twice a day 5)  Oscal 500/200 D-3 500-200 Mg-unit Tabs (Calcium-vitamin d) .... One tab by mouth tid 6)  Gabapentin 300 Mg Caps (Gabapentin) .... Take 1 tablet by mouth once a day 7)  Flexeril 10 Mg Tabs (Cyclobenzaprine hcl) .... Take 1 tab by mouth at bedtime 8)  Vicodin Es 7.5-750 Mg Tabs (Hydrocodone-acetaminophen) .... Take 1 tab by mouth at bedtime 9)  Prozac 10 Mg Tabs (Fluoxetine hcl) .... Take 1 tablet by mouth once a day 10)  Catapres 0.2 Mg Tabs (Clonidine hcl) .... Take 1 tab by mouth at bedtime 11)  Omeprazole 20 Mg Tbec (Omeprazole) .... Take 1 tablet by mouth once a day 12)  Levothroid 150  Mcg Tabs (Levothyroxine sodium) .... One and half tabs by mouth  every mon - thurs and one tab by mouth on fri,sat,sun 13)  Amitriptyline Hcl 100 Mg Tabs (Amitriptyline hcl) .Marland Kitchen.. 1-2 tabs at bedtime 14)  Depakote Er 250 Mg Xr24h-tab (Divalproex sodium) .... Take 1 tab by mouth at bedtime 15)  Oxybutynin Chloride 5 Mg Tabs (Oxybutynin chloride) .... Take 1 tablet by mouth two times a day 16)  Tessalon Perles 100 Mg Caps (Benzonatate) .... Take 1 capsule by mouth three times a day 17)  Vitamin D (ergocalciferol) 50000 Unit Caps (Ergocalciferol) .... One cap by mouth every week 18)  Loratadine 10 Mg Tabs (Loratadine) .... Take 1 daily as needed for hives 19)  Zithromax Z-pak 250 Mg Tabs (Azithromycin) .... Take once daily as directed  Patient Instructions: 1)  Keep your next appt with Dr Lodema Hong. 2)  If you develop difficulty breathing, swelling in your throat, or difficulty swallowing you must go immediately to the ER. 3)  I have prescribed a different antibiotics for your sinus infection. 4)  I have prescribed Loratadine to  help with the hives, & itching. 5)  You have received a shot of Depo Medrol today to help with the hives. Prescriptions: ZITHROMAX Z-PAK 250 MG TABS (AZITHROMYCIN) take once daily as directed  #1 pack x 0   Entered and Authorized by:   Esperanza Sheets PA   Signed by:   Esperanza Sheets PA on 12/21/2009   Method used:   Electronically to        [facility] (retail)       [address]       [place], [place]  [address]       Ph: [phone]       Fax: [phone]   RxID:   [id number] LORATADINE 10 MG TABS (LORATADINE) take 1 daily as needed for hives  #10 x 0   Entered and Authorized by:   Esperanza Sheets PA   Signed by:   Esperanza Sheets PA on 12/21/2009   Method used:   Electronically to        [facility] (retail)       [address]       [place], [place]  [address]       Ph: [phone]       Fax: [phone]   RxID:   [id number]    Medication Administration  Injection # 1:    Medication: Depo- Medrol 80mg     Diagnosis: URTICARIA (ICD-708.9)    Route: IM    Site: RUOQ gluteus    Exp Date: 11/11    Lot #: [id number]    Mfr: [employer]    Patient tolerated injection without complications    Given by: Adella Hare LPN (December 21, 2009 1:37 PM)  Orders Added: 1)  Est. Patient Level IV [93235] 2)  Depo- Medrol 80mg  [J1040] 3)  Admin of Therapeutic Inj  intramuscular or subcutaneous [57322]

## 2010-10-11 NOTE — Letter (Signed)
Summary: 1st Missed Appt.  [facility]  [address]   [place], [place] [address]   Phone: [phone]  Fax: [phone]    November 04, 2009  MRN: [id number]  Diane [name] [address] [place], [place]  [address]  Dear Ms. Parton,  At [facility], we make every attempt to fit patients into our schedule by reserving several appointment slots for same-day appointments.  However, we cannot always make appointments for patients the same day they are calling.  At the end of the day, we look back at our schedule and find that because of last-minute cancellations and patients not showing up for their scheduled appointments, we have several appointment slots that are left open and could have been used by another person who really needed it.  In the past, you may have been one of the patients who could not get in when you needed to.  But recently, you were one of the patients with an appointment that you didn't show up for or canceled too late for [place] to fill it.  We choose not to charge no-show or last minute cancellation fees to our patients, like many other offices do.  We do not wish to institute that policy and hope we never have to.  However, we kindly request that you assist [place] by providing at least 24 hours' notice if you can't make your appointment.  If no-shows or late cancellations become habitual (i.e. Three or more in a one-year period), we may terminate the physician-patient relationship.    Thank you for your consideration and cooperation.   Altamease Oiler

## 2010-10-11 NOTE — Assessment & Plan Note (Signed)
Summary: physical   Vital Signs:  Patient profile:   60 year old female Menstrual status:  hysterectomy Height:      63 inches Weight:      243 pounds BMI:     43.20 O2 Sat:      95 % Pulse rate:   87 / minute Pulse rhythm:   regular Resp:     16 per minute BP sitting:   130 / 82  (left arm) Cuff size:   large  Vitals Entered By: Everitt Amber LPN (December 07, 2009 9:03 AM)  Nutrition Counseling: Patient's BMI is greater than 25 and therefore counseled on weight management options. CC: CPE  Vision Screening:Left eye with correction: 20 / 40 Right eye with correction: 20 / 50 Both eyes with correction: 20 / 25  Color vision testing: normal      Vision Entered By: Everitt Amber LPN (December 07, 2009 9:04 AM)   CC:  CPE.  History of Present Illness: Reports  that she has not been doing well. Denies recent fever or chills. Denies sinus pressure, nasal congestion , ear pain or sore throat. reports  chest congestion, and  cough productive of sputum. Denies chest pain, palpitations, PND, orthopnea or leg swelling. Reports  abdominal pain, and constipation.  i Denies change in bowel movements or bloody stool. Reports dysuria , and malodoros urine Reports reduced mobility of spine Denies headaches, vertigo, seizures. reports uncontrolled depressionand  anxiety .She is neither suicidal or homicidal. Denies  rash, lesions, or itch.      Current Medications (verified): 1)  Klor-Con M20 20 Meq  Tbcr (Potassium Chloride Crys Cr) .... Take One Tablet By Mouth Once A Day 2)  Crestor 40 Mg  Tabs (Rosuvastatin Calcium) .... Take Oe Tablet By Mouth Once A Day 3)  Miralax   Powd (Polyethylene Glycol 3350) .... As Directed 4)  Fluticasone Propionate 50 Mcg/act  Susp (Fluticasone Propionate) .... Inhale One Puff in Each Nostril Twice A Day 5)  Oscal 500/200 D-3 500-200 Mg-Unit Tabs (Calcium-Vitamin D) .... One Tab By Mouth Tid 6)  Gabapentin 300 Mg Caps (Gabapentin) .... Take 1 Tablet  By Mouth Once A Day 7)  Flexeril 10 Mg Tabs (Cyclobenzaprine Hcl) .... Take 1 Tab By Mouth At Bedtime 8)  Vicodin Es 7.5-750 Mg Tabs (Hydrocodone-Acetaminophen) .... Take 1 Tab By Mouth At Bedtime 9)  Prozac 10 Mg Tabs (Fluoxetine Hcl) .... Take 1 Tablet By Mouth Once A Day 10)  Catapres 0.2 Mg Tabs (Clonidine Hcl) .... Take 1 Tab By Mouth At Bedtime 11)  Omeprazole 20 Mg Tbec (Omeprazole) .... Take 1 Tablet By Mouth Once A Day 12)  Levothroid 150 Mcg Tabs (Levothyroxine Sodium) .... One and Half Tabs By Mouth  Every Mon - Thurs and One Tab By Mouth On Fri,sat,sun 13)  Amitriptyline Hcl 100 Mg Tabs (Amitriptyline Hcl) .Marland Kitchen.. 1-2 Tabs At Bedtime 14)  Niaspan 500 Mg Cr-Tabs (Niacin (Antihyperlipidemic)) .... 2 Tablets At Bedtime 15)  Depakote Er 250 Mg Xr24h-Tab (Divalproex Sodium) .... Take 1 Tab By Mouth At Bedtime 16)  Oxybutynin Chloride 5 Mg Tabs (Oxybutynin Chloride) .... Take 1 Tablet By Mouth Two Times A Day  Allergies (verified): 1)  ! Sulfa 2)  ! Neosporin  Review of Systems      See HPI General:  Complains of chills, fatigue, and weakness; denies fever. Eyes:  Denies blurring and discharge. ENT:  Complains of nasal congestion and postnasal drainage. CV:  Denies chest pain or discomfort, difficulty breathing  while lying down, palpitations, and swelling of feet. Resp:  Complains of cough and sputum productive; 1 months h/o cough sometimes yellow sputum. GI:  Complains of constipation; 1 week constipaption. GU:  Complains of dysuria; malodoros urine x 1 month. MS:  Complains of joint pain, low back pain, muscle aches, and stiffness; uncontrolled back and ankle pain. Derm:  Denies itching, lesion(s), and rash. Neuro:  Complains of headaches; denies seizures and sensation of room spinning. Psych:  Complains of anxiety, depression, and mental problems; denies suicidal thoughts/plans, thoughts of violence, and unusual visions or sounds. Endo:  Complains of weight change. Heme:  Denies  abnormal bruising and bleeding. Allergy:  Complains of seasonal allergies; denies hives or rash and itching eyes.  Physical Exam  General:  Well-developed,well-nourished,in no acute distress; alert,appropriate and cooperative throughout examination Head:  Normocephalic and atraumatic without obvious abnormalities. No apparent alopecia or balding. Eyes:  No corneal or conjunctival inflammation noted. EOMI. Perrla. Funduscopic exam benign, without hemorrhages, exudates or papilledema. Vision grossly normal. Ears:  External ear exam shows no significant lesions or deformities.  Otoscopic examination reveals clear canals, tympanic membranes are intact bilaterally without bulging, retraction, inflammation or discharge. Hearing is grossly normal bilaterally. Nose:  External nasal examination shows no deformity or inflammation. Nasal mucosa are pink and moist without lesions or exudates. Mouth:  pharynx pink and moist, poor dentition, and teeth missing.   Neck:  No deformities, masses, or tenderness noted. Chest Wall:  No deformities, masses, or tenderness noted. Breasts:  No mass, nodules, thickening, tenderness, bulging, retraction, inflamation, nipple discharge or skin changes noted.   Lungs:  decreased air entry, bilateral crackles, no wheezes Heart:  Normal rate and regular rhythm. S1 and S2 normal without gallop, murmur, click, rub or other extra sounds. Abdomen:  Bowel sounds positive,abdomen soft and non-tender without masses, organomegaly or hernias noted. Rectal:  No external abnormalities noted. Normal sphincter tone. No rectal masses or tenderness.Guaic negative stool Genitalia:  normal introitus.  white vag d/c . uterus absenty, no adneal masses or tenderness Msk:  No deformity or scoliosis noted of thoracic or lumbar spine.   Pulses:  R and L carotid,radial,femoral,dorsalis pedis and posterior tibial pulses are full and equal bilaterally Extremities:  decreased rOM spine, adequate in  shoulders, hips and knees Neurologic:  No cranial nerve deficits noted. Station and gait are normal. Plantar reflexes are down-going bilaterally. DTRs are symmetrical throughout. Sensory, motor and coordinative functions appear intact. Skin:  Intact without suspicious lesions or rashes Cervical Nodes:  No lymphadenopathy noted Axillary Nodes:  No palpable lymphadenopathy Inguinal Nodes:  No significant adenopathy Psych:  Oriented X3 and memory intact for recent and remote.     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 Take 1 tab by mouth at bedtime  Orders: T-Basic Metabolic Panel [phone])  BP today: 130/82 Prior BP: 140/82 (07/12/2009)  Labs Reviewed: K+: 3.7 (07/12/2009) Creat: : 0.67 (07/12/2009)   Chol: 138 (07/12/2009)   HDL: 49 (07/12/2009)   LDL: 71 (07/12/2009)   TG: 90 (07/12/2009)  Problem # 3:  DEPRESSION (ICD-311) Assessment: Improved  Her updated medication list for this problem includes:    Prozac 10 Mg Tabs (Fluoxetine hcl) .Marland Kitchen... Take 1 tablet  by mouth once a day    Amitriptyline Hcl 100 Mg Tabs (Amitriptyline hcl) .Marland Kitchen... 1-2 tabs at bedtime  Problem # 4:  HYPERLIPIDEMIA (ICD-272.4) Assessment: Deteriorated  Her updated medication list for this problem includes:    Crestor 40 Mg Tabs (Rosuvastatin calcium) .Marland Kitchen... Take oe tablet by mouth once a day    Niaspan 500 Mg Cr-tabs (Niacin (antihyperlipidemic)) .Marland Kitchen... 2 tablets at bedtime  Orders: T-Lipid Profile [phone]) T-Hepatic Function [phone])  Labs Reviewed: SGOT: 19 (07/12/2009)   SGPT: 14 (07/12/2009)   HDL:49 (07/12/2009), 48 (03/10/2009)  LDL:71  (07/12/2009), 29 (29/56/2130)  Chol:138 (07/12/2009), 97 (03/10/2009)  Trig:90 (07/12/2009), 98 (03/10/2009)  Problem # 5:  GERD (ICD-530.81) Assessment: Improved  Her updated medication list for this problem includes:    Omeprazole 20 Mg Tbec (Omeprazole) .Marland Kitchen... Take 1 tablet by mouth once a day  Problem # 6:  ACUTE CYSTITIS (ICD-595.0) Assessment: Comment Only  The following medications were removed from the medication list:    Detrol La 4 Mg Xr24h-cap (Tolterodine tartrate) .Marland Kitchen... Take 1 capsule by mouth once a day    Cipro 500 Mg Tabs (Ciprofloxacin hcl) ..... One tab by mouth bid Her updated medication list for this problem includes:    Oxybutynin Chloride 5 Mg Tabs (Oxybutynin chloride) .Marland Kitchen... Take 1 tablet by mouth two times a day    Penicillin V Potassium 500 Mg Tabs (Penicillin v potassium) .Marland Kitchen... Take 1 tablet by mouth three times a day  Orders: UA Dipstick W/ Micro (manual) ([id number]) T-Culture, Urine ([id number])  Problem # 7:  ACUTE BRONCHITIS (ICD-466.0) Assessment: Comment Only  The following medications were removed from the medication list:    Cipro 500 Mg Tabs (Ciprofloxacin hcl) ..... One tab by mouth bid Her updated medication list for this problem includes:    Tessalon Perles 100 Mg Caps (Benzonatate) .Marland Kitchen... Take 1 capsule by mouth three times a day    Penicillin V Potassium 500 Mg Tabs (Penicillin v potassium) .Marland Kitchen... Take 1 tablet by mouth three times a day  Complete Medication List: 1)  Klor-con M20 20 Meq Tbcr (Potassium chloride crys cr) .... Take one tablet by mouth once a day 2)  Crestor 40 Mg Tabs (Rosuvastatin calcium) .... Take oe tablet by mouth once a day 3)  Miralax Powd (Polyethylene glycol 3350) .... As directed 4)  Fluticasone Propionate 50 Mcg/act Susp (Fluticasone propionate) .... Inhale one puff in each nostril twice a day 5)  Oscal 500/200 D-3 500-200 Mg-unit Tabs (Calcium-vitamin d) .... One tab by mouth tid 6)  Gabapentin 300 Mg Caps (Gabapentin)  .... Take 1 tablet by mouth once a day 7)  Flexeril 10 Mg Tabs (Cyclobenzaprine hcl) .... Take 1 tab by mouth at bedtime 8)  Vicodin Es 7.5-750 Mg Tabs (Hydrocodone-acetaminophen) .... Take 1 tab by mouth at bedtime 9)  Prozac 10 Mg Tabs (Fluoxetine hcl) .... Take 1 tablet by mouth once a day 10)  Catapres 0.2 Mg Tabs (Clonidine hcl) .... Take 1 tab by mouth at bedtime 11)  Omeprazole 20 Mg Tbec (Omeprazole) .... Take 1 tablet by mouth once a day 12)  Levothroid 150 Mcg Tabs (Levothyroxine sodium) .... One and half tabs by mouth  every mon - thurs and one tab by mouth on fri,sat,sun 13)  Amitriptyline Hcl 100 Mg Tabs (Amitriptyline hcl) .Marland Kitchen.. 1-2 tabs at bedtime 14)  Niaspan 500 Mg Cr-tabs (Niacin (antihyperlipidemic)) .... 2 tablets at bedtime 15)  Depakote Er 250 Mg Xr24h-tab (Divalproex sodium) .... Take 1 tab by mouth  at bedtime 16)  Oxybutynin Chloride 5 Mg Tabs (Oxybutynin chloride) .... Take 1 tablet by mouth two times a day 17)  Tessalon Perles 100 Mg Caps (Benzonatate) .... Take 1 capsule by mouth three times a day 18)  Penicillin V Potassium 500 Mg Tabs (Penicillin v potassium) .... Take 1 tablet by mouth three times a day  Other Orders: T-TSH [id number] T-Vitamin D (25-Hydroxy) [phone]) Pap Smear (22025) Hemoccult Guaiac-1 spec.(in office) (42706) Radiology Referral (Radiology)  Patient Instructions: 1)  Please schedule a follow-up appointment in 3 months. 2)  It is important that you exercise regularly at least 20 minutes 5 times a week. If you develop chest pain, have severe difficulty breathing, or feel very tired , stop exercising immediately and seek medical attention. 3)  You need to lose weight. Consider a lower calorie diet and regular exercise.  4)  fasting labs today. 5)  you need to start calcium with d 1200mg  /1000 units one daily 6)  increase fiber , vegetables, fresh fruit and water to help your constipation. 7)  Magnesium ciotrate is a good laxative, and  we willgive you samples of colace one twice daily and senoko one twice daily, these are oTC 8)  meds are sent in for your cough Prescriptions: PENICILLIN V POTASSIUM 500 MG TABS (PENICILLIN V POTASSIUM) Take 1 tablet by mouth three times a day  #30 x 0   Entered and Authorized by:   Syliva Overman MD   Signed by:   Syliva Overman MD on 12/07/2009   Method used:   Electronically to        [facility] (retail)       [address]       [place], [place]  [address]       Ph: [phone]       Fax: [phone]   RxID:   [id number] TESSALON PERLES 100 MG CAPS (BENZONATATE) Take 1 capsule by mouth three times a day  #30 x 0   Entered and Authorized by:   Syliva Overman MD   Signed by:   Syliva Overman MD on 12/07/2009   Method used:   Electronically to        [facility] (retail)       [address]       [place], [place]  [address]       Ph: [phone]       Fax: [phone]   RxID:   [id number]      Laboratory Results   Urine Tests    Routine Urinalysis   Color: yellow Appearance: Clear Glucose: negative   (Normal Range: Negative) Bilirubin: negative   (Normal Range: Negative) Ketone: trace (5)   (Normal Range: Negative) Spec. 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 Patient 40-64 years [99396] 2)  T-Basic Metabolic Panel [phone] 3)  T-Lipid Profile [id number] 4)  T-Hepatic Function [80076-22960] 5)  T-TSH [[id number]] 6)  T-Vitamin D (25-Hydroxy) [phone] 7)  Pap Smear [88150] 8)  Hemoccult Guaiac-1 spec.(in office) [82270] 9)  Radiology Referral [Radiology] 10)  UA Dipstick W/ Micro (manual) [81000] 11)  T-Culture, Urine [[id number]] 12)  Ketorolac-Toradol 15mg  [J1885] 13)  Admin of Therapeutic Inj  intramuscular or subcutaneous [02725]

## 2010-10-11 NOTE — Progress Notes (Signed)
  Phone Note Other Incoming   Caller: dr Sherissa Tenenbaum Summary of Call: pls advise pt her vit D level is low ,15.9, should be 302 to 100, and erx vitd 50,000units once weekly #4 refill 4 pls Initial call taken by: Syliva Overman MD,  December 17, 2009 5:39 AM  Follow-up for Phone Call        called patient, left message  rx sent Follow-up by: Adella Hare LPN,  December 17, 2009 1:37 PM  Additional Follow-up for Phone Call Additional follow up Details #1::        patient aware Additional Follow-up by: Adella Hare LPN,  December 17, 2009 1:58 PM    New/Updated Medications: VITAMIN D (ERGOCALCIFEROL) 50000 UNIT CAPS (ERGOCALCIFEROL) one cap by mouth every week Prescriptions: VITAMIN D (ERGOCALCIFEROL) 50000 UNIT CAPS (ERGOCALCIFEROL) one cap by mouth every week  #4 x 4   Entered by:   Adella Hare LPN   Authorized by:   Syliva Overman MD   Signed by:   Adella Hare LPN on 14/78/2956   Method used:   Electronically to        [facility] (retail)       [address]       [place], [place]  [address]       Ph: [phone]       Fax: [phone]   RxID:   [id number]

## 2010-10-11 NOTE — Assessment & Plan Note (Signed)
Summary: office visit   Vital Signs:  Patient profile:   60 year old female Menstrual status:  hysterectomy Height:      63 inches Weight:      218.44 pounds BMI:     38.83 O2 Sat:      97 % Pulse rate:   80 / minute Pulse rhythm:   regular Resp:     16 per minute BP sitting:   110 / 74  (left arm) Cuff size:   large  Vitals Entered By: Everitt Amber (May 26, 2009 10:53 AM)  Nutrition Counseling: Patient's BMI is greater than 25 and therefore counseled on weight management options. CC: Feels really bloated, she has been constipated for about a week now. Last saturday she states that she "blanked" out and fell but she was only out a second. She had just got out of the shower and she was looking for something in her closet and she passed out Is Patient Diabetic? No   CC:  Feels really bloated and she has been constipated for about a week now. Last saturday she states that she "blanked" out and fell but she was only out a second. She had just got out of the shower and she was looking for something in her closet and she passed out.  History of Present Illness: Pt has several concerns . She has a 1 month h/o head pressure with bloody yellow nasal drainage.Also intermittentchils. She c/op back pain and spasms, and is willing to forgo any referral for pain management. She reports improved depression anjd continues to follow with dr. Lolly Mustache.  Allergies: 1)  ! Sulfa 2)  ! Neosporin  Review of Systems      See HPI General:  Complains of fatigue and sleep disorder; denies chills and fever. Eyes:  Denies discharge, eye pain, and red eye. ENT:  Complains of nasal congestion, postnasal drainage, and sinus pressure; maxillary pressure with bloody drainage for 1 month. CV:  Denies chest pain or discomfort, difficulty breathing while lying down, palpitations, and swelling of feet. Resp:  Denies cough, sputum productive, and wheezing. GI:  Denies abdominal pain, constipation, diarrhea,  nausea, and vomiting. GU:  Denies dysuria, incontinence, and urinary frequency. MS:  left foot pain, medial aspect, painful when she first  starts to walk x 3 month, no trauma, prevents ecercise. Derm:  Denies itching and rash. Neuro:  Complains of headaches. Psych:  Complains of anxiety and easily angered; denies easily tearful, irritability, suicidal thoughts/plans, and thoughts of violence. Endo:  Denies cold intolerance, excessive hunger, excessive thirst, excessive urination, heat intolerance, polyuria, and weight change. Heme:  Denies abnormal bruising and bleeding. Allergy:  Denies hives or rash and itching eyes.  Physical Exam  General:  alert, well-hydrated, and overweight-appearing.  HEENT: No facial asymmetry,  EOMI, No sinus tenderness, TM's Clear, oropharynx  pink and moist.   Chest: Clear to auscultation bilaterally.  CVS: S1, S2, No murmurs, No S3.   Abd: Soft, Nontender.  MS: decreased ROM spine, hips, shoulders and knees.  Ext: No edema.   CNS: CN 2-12 intact, power tone and sensation normal throughout.   Skin: Intact, no visible lesions or rashes.  Psych: Good eye contact, normal affect.  Memory intact, not anxious or depressed appearing.     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 Take 1 tab by mouth at bedtime  Orders: T-Basic Metabolic Panel [id number])  BP today: 110/74 Prior BP: 120/86 (03/10/2009)  Labs Reviewed: K+: 4.0 (03/10/2009) Creat: : 0.79 (03/10/2009)   Chol: 97 (03/10/2009)   HDL: 48  (03/10/2009)   LDL: 29 (03/10/2009)   TG: 98 (03/10/2009)  Problem # 4:  HYPOTHYROIDISM (ICD-244.9) Assessment: Comment Only  Her updated medication list for this problem includes:    Levothroid 150 Mcg Tabs (Levothyroxine sodium) ..... One and half tabs by mouth  every mon - thurs and one tab by mouth on fri,sat,sun  Labs Reviewed: TSH: 0.480 (03/10/2009)    HgBA1c: 5.5 (09/09/2008) Chol: 97 (03/10/2009)   HDL: 48 (03/10/2009)   LDL: 29 (03/10/2009)   TG: 98 (03/10/2009)  Problem # 5:  GERD (ICD-530.81) Assessment: Improved  Her updated medication list for this problem includes:    Omeprazole 20 Mg Tbec (Omeprazole) .Marland Kitchen... Take 1 tablet by mouth once a day  Complete Medication List: 1)  Klor-con M20 20 Meq Tbcr (Potassium chloride crys cr) .... Take one tablet by mouth once a day 2)  Crestor 40 Mg Tabs (Rosuvastatin calcium) .... Take oe tablet by mouth once a day 3)  Miralax Powd (Polyethylene glycol 3350) .... As directed 4)  Cetirizine Hcl 10 Mg Tabs (Cetirizine hcl) .... Take one tablet by mouth once a day 5)  Fluticasone Propionate 50 Mcg/act Susp (Fluticasone propionate) .... Inhale one puff in each nostril twice a day 6)  Oscal 500/200 D-3 500-200 Mg-unit Tabs (Calcium-vitamin d) .... One tab by mouth tid 7)  Gabapentin 300 Mg Caps (Gabapentin) .... Take 1 tablet by mouth once a day 8)  Flexeril 10 Mg Tabs (Cyclobenzaprine hcl) .... Take 1 tab by mouth at bedtime 9)  Vicodin Es 7.5-750 Mg Tabs (Hydrocodone-acetaminophen) .... Take 1 tab by mouth at bedtime 10)  Prozac 10 Mg Tabs (Fluoxetine hcl) .... Take 1 tablet by mouth once a day 11)  Catapres 0.2 Mg Tabs (Clonidine hcl) .... Take 1 tab by mouth at bedtime 12)  Omeprazole 20 Mg Tbec (Omeprazole) .... Take 1 tablet by mouth once a day 13)  Levothroid 150 Mcg Tabs (Levothyroxine sodium) .... One and half tabs by mouth  every mon - thurs and one tab by mouth on fri,sat,sun 14)  Betamethasone Cream  .... Apply topically to  neck two times a day prn 15)  Amitriptyline Hcl 100 Mg Tabs (Amitriptyline hcl) .Marland Kitchen.. 1-2 tabs at bedtime 16)  Ciprofloxacin Hcl 500 Mg Tabs (Ciprofloxacin hcl) .... Take 1 tablet by mouth two times a day 17)  Niaspan 500 Mg Cr-tabs (Niacin (antihyperlipidemic)) .... 2 tablets at bedtime 18)  Penicillin V Potassium 500 Mg Tabs (Penicillin v potassium) .... Take 1 tablet by mouth three times a day 19)  Diclofenac Sodium 75 Mg Tbec (Diclofenac sodium) .... One tab by mouth qd  Other Orders: T-CBC w/Diff [phone]) T-Lipid Profile [id number]) T-Hepatic Function [id number]) T-TSH [id number]) Podiatry Referral (Podiatry)  Patient Instructions: 1)  CPE in 3 to 4 months. 2)  It is important that you exercise regularly at least 20 minutes 5 times a week. If you develop chest pain, have severe difficulty breathing, or feel very tired , stop exercising immediately and seek medical attention. 3)  You need to lose weight.  Consider a lower calorie diet and regular exercise.  4)  BMP prior to visit, ICD-9: 5)  Hepatic Panel prior to visit, ICD-9:  FASTING IN 3 MONTHS 6)  Lipid Panel prior to visit, ICD-9: 7)  TSH prior to visit, ICD-9: 8)  CBC w/ Diff prior to visit, ICD-9: 9)  YOU WILL BE REFERRED TO PODIATRY BOUT FOOT PAIN   10)  mEDS WILL BE SENT IN FOR SINUS INFECTION AND YOU WILL GET INJECTIONS FOR YOUR BACK Prescriptions: DICLOFENAC SODIUM 75 MG TBEC (DICLOFENAC SODIUM) one tab by mouth qd  #30 x 4   Entered by:   Worthy Keeler LPN   Authorized by:   Syliva Overman MD   Signed by:   Worthy Keeler LPN on 16/06/9603   Method used:   Electronically to        [facility] (retail)       [address]       [place], [place]  [address]       Ph: [phone]       Fax: [phone]   RxID:   [id number] PENICILLIN V POTASSIUM 500 MG TABS (PENICILLIN V POTASSIUM) Take 1 tablet by mouth three times a day  #30 x 0   Entered and Authorized by:   Syliva Overman MD   Signed by:   Syliva Overman MD on 05/26/2009   Method used:   Electronically to        [facility] (retail)       [address]       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 Patient Level IV [99214] 2)  T-Basic Metabolic Panel [[id number]] 3)  T-CBC w/Diff [[id number]] 4)  T-Lipid Profile [80061-22930] 5)  T-Hepatic Function [80076-22960] 6)  T-TSH [02542-70623] 7)  Depo- Medrol 80mg  [J1040] 8)  Ketorolac-Toradol 15mg  [J1885] 9)  Admin of Therapeutic Inj  intramuscular or subcutaneous [96372] 10)  Podiatry Referral [Podiatry]

## 2010-10-11 NOTE — Progress Notes (Signed)
  Phone Note Other Incoming   Caller: dr simpson Summary of Call: pls advise pt to diconbtinue niaspan, and use only crestor, her cholesterol is too loe at 96, also pls send a note to the pharmacy to stop the niaspan, and remve it from her med list Initial call taken by: Syliva Overman MD,  December 17, 2009 5:50 AM  Follow-up for Phone Call        patient aware Follow-up by: Adella Hare LPN,  December 17, 2009 9:21 AM

## 2010-10-11 NOTE — Progress Notes (Signed)
  Phone Note Call from Patient   Caller: Patient Summary of Call: patient called this morning stating that her allergic rash is worse today and is having some difficulty breathing, states throat feels a little swollen, advised patient to go to nearest er immediately and to follow up with [place].  patient agrees Initial call taken by: Adella Hare LPN,  December 22, 2009 8:33 AM

## 2010-10-11 NOTE — Progress Notes (Signed)
Summary: Youth worker Note Call from Patient   Summary of Call: would like to get a Child psychotherapist. [phone] doc put her in boot and alot that she can't do.  Initial call taken by: Rudene Anda,  June 29, 2009 11:30 AM  Follow-up for Phone Call        pls advise her to cal social servise  and explain her needs they will work with her wedo not refer to Child psychotherapist, she makes the call Follow-up by: Syliva Overman MD,  June 29, 2009 12:44 PM  Additional Follow-up for Phone Call Additional follow up Details #1::        Phone Call Completed Additional Follow-up by: Worthy Keeler LPN,  June 29, 2009 2:22 PM

## 2010-10-11 NOTE — Assessment & Plan Note (Signed)
Summary: office visit   Vital Signs:  Patient Profile:   60 Years Old Female Height:     63 inches (160.02 cm) Weight:      224 pounds (101.82 kg) BMI:     39.82 BSA:     2.03 O2 Sat:      94 % Pulse rate:   76 / minute Pulse rhythm:   regular Resp:     16 per minute BP sitting:   120 / 80  (left arm)  Pt. in pain?   yes    Location:   lower back    Intensity:   6    Type:       burning  Vitals Entered By: Everitt Amber (October 21, 2008 1:41 PM)                  Chief Complaint:  Follow up chronic problems and rash on neck and arms that won't go away.  History of Present Illness: The pt reports that the rash is worsening, it is spreading and itches all thetime, and medication prescribed has not been beneficial.She denies drainage from the rash, fever or chills. She denies uncontrolled depression , anxiety or illicit drug use, and is being followed by psychiatry.. she continues to experience chronic back pain and is requesting an anti-inflammatory shot for this.     Current Allergies: ! SULFA ! NEOSPORIN    Risk Factors:  Tobacco use:  never Alcohol use:  no Seatbelt use:  100 %  Mammogram History:    Date of Last Mammogram:  09/14/2008  PAP Smear History:    Date of Last PAP Smear:  04/11/2006   Review of Systems  General      Denies chills, fever, and weakness.  Eyes      Denies blurring, double vision, itching, and red eye.  ENT      Denies earache, hoarseness, nasal congestion, and sinus pressure.  CV      Denies chest pain or discomfort, palpitations, and swelling of feet.  Resp      Denies cough, sputum productive, and wheezing.  GI      Denies abdominal pain, constipation, diarrhea, nausea, and vomiting.  GU      Denies dysuria and urinary frequency.  MS      Complains of joint pain, low back pain, mid back pain, and stiffness.  Derm      See HPI      Denies itching, lesion(s), and rash.  Psych      Complains of anxiety,  depression, and irritability.      Denies suicidal thoughts/plans, thoughts of violence, and unusual visions or sounds.  Endo      Denies excessive hunger, excessive thirst, and polyuria.  Heme      Denies abnormal bruising, bleeding, and skin discoloration.  Allergy      Denies hives or rash, itching eyes, and sneezing.   Physical Exam  General:     Well-developed,well-nourished,in no acute distress; alert,appropriate and cooperative throughout examination Head:     Normocephalic and atraumatic without obvious abnormalities. No apparent alopecia or balding. Eyes:     No corneal or conjunctival inflammation noted. EOMI. Perrla. Funduscopic exam benign, without hemorrhages, exudates or papilledema. Vision grossly normal. Ears:     External ear exam shows no significant lesions or deformities.  Otoscopic examination reveals clear canals, tympanic membranes are intact bilaterally without bulging, retraction, inflammation or discharge. Hearing is grossly normal bilaterally. Nose:  External nasal examination shows no deformity or inflammation. Nasal mucosa are pink and moist without lesions or exudates. Mouth:     pharynx pink and moist, poor dentition, and teeth missing.   Neck:     No deformities, masses, or tenderness noted. Chest Wall:     No deformities, masses, or tenderness noted. Breasts:     No mass, nodules, thickening, tenderness, bulging, retraction, inflamation, nipple discharge or skin changes noted.   Lungs:     Normal respiratory effort, chest expands symmetrically. Lungs are clear to auscultation, no crackles or wheezes. Heart:     Normal rate and regular rhythm. S1 and S2 normal without gallop, murmur, click, rub or other extra sounds. Abdomen:     Bowel sounds positive,abdomen soft and non-tender without masses, organomegaly or hernias noted. Rectal:     No external abnormalities noted. Normal sphincter tone. No rectal masses or tenderness.guaic negative stool  Genitalia:     normal introitus and no external lesions. Uterus absent and no adnexal masses.  Msk:     decreased ROM thoracolumbar spine Pulses:     R and L carotid,radial,femoral,dorsalis pedis and posterior tibial pulses are full and equal bilaterally Extremities:     no edema Neurologic:     alert & oriented X3, cranial nerves II-XII intact, strength normal in all extremities, sensation intact to light touch, gait normal, and DTRs symmetrical and normal.   Skin:     hyperpigmented macular rash on limbs and trunk with multiple scratch marks, skin is also dry. Cervical Nodes:     No lymphadenopathy noted Axillary Nodes:     No palpable lymphadenopathy Inguinal Nodes:     No significant adenopathy Psych:     Cognition and judgment appear intact. Alert and cooperative with normal attention span and concentration. No apparent delusions, illusions, hallucinations    Impression & Recommendations:  Problem # 1:  DERMATITIS, CHRONIC (ICD-692.9) Assessment: Deteriorated  Her updated medication list for this problem includes:    Cetirizine Hcl 10 Mg Tabs (Cetirizine hcl) .Marland Kitchen... Take one tablet by mouth once a day    Prednisone (pak) 5 Mg Tabs (Prednisone) ..... Use as directed  Future Orders: Dermatology Referral (Derma) ... 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 Take one tab by mouth mon-fri and one half tab on sat and sun  Her updated medication list for this problem includes:    Levothroid 150 Mcg Tabs (Levothyroxine sodium) ..... One and half tabs by mouth  every mon - thurs and one tab by mouth on fri,sat,sun  Labs Reviewed:  TSH: 7.199 (09/09/2008)    HgBA1c: 5.5 (09/09/2008) Chol: 182 (09/09/2008)   HDL: 51 (09/09/2008)   LDL: 102 (09/09/2008)   TG: 144 (09/09/2008)   Problem # 4:  HYPERTENSION (ICD-401.9) Assessment: Improved  Her updated medication list for this problem includes:    Catapres 0.2 Mg Tabs (Clonidine hcl) .Marland Kitchen... Take 1 tab by mouth at bedtime  BP today: 120/80 Prior BP: 140/92 (09/09/2008)  Labs Reviewed: Creat: 0.87 (09/09/2008) Chol: 182 (09/09/2008)   HDL: 51 (09/09/2008)   LDL: 102 (09/09/2008)   TG: 144 (09/09/2008)   Problem # 5:  HYPERLIPIDEMIA (ICD-272.4) Assessment: Comment Only  Her  updated medication list for this problem includes:    Niaspan 500 Mg Tbcr (Niacin (antihyperlipidemic)) .Marland Kitchen... Take four tablets by mouth at bedtime    Crestor 40 Mg Tabs (Rosuvastatin calcium) .Marland Kitchen... Take oe tablet by mouth once a day  Labs Reviewed: Chol: 182 (09/09/2008)   HDL: 51 (09/09/2008)   LDL: 102 (09/09/2008)   TG: 144 (09/09/2008) SGOT: 18 (09/09/2008)   SGPT: 16 (09/09/2008)   Problem # 6:  HEADACHE (ICD-784.0) Assessment: Unchanged  Her updated medication list for this problem includes:    Percocet 5-325 Mg Tabs (Oxycodone-acetaminophen) .Marland Kitchen... Take one to two tabs every 4 hrs as needed    Ibu 800 Mg Tabs (Ibuprofen) .Marland Kitchen... Take 1 tablet by mouth three times a day    Vicodin Es 7.5-750 Mg Tabs (Hydrocodone-acetaminophen) .Marland Kitchen... Take 1 tab by mouth at bedtime managed by neurology   Complete Medication List: 1)  Niaspan 500 Mg Tbcr (Niacin (antihyperlipidemic)) .... Take four tablets by mouth at bedtime 2)  Klor-con M20 20 Meq Tbcr (Potassium chloride crys cr) .... Take one tablet by mouth once a day 3)  Crestor 40 Mg Tabs (Rosuvastatin calcium) .... Take oe tablet by mouth once a day 4)  Miralax Powd (Polyethylene glycol 3350) .... As directed 5)  Cetirizine Hcl 10 Mg Tabs (Cetirizine hcl) .... Take one tablet by mouth once a day 6)  Fluticasone Propionate 50 Mcg/act Susp  (Fluticasone propionate) .... Inhale one puff in each nostril twice a day 7)  Oscal 500/200 D-3 500-200 Mg-unit Tabs (Calcium-vitamin d) .... One tab by mouth tid 8)  Percocet 5-325 Mg Tabs (Oxycodone-acetaminophen) .... Take one to two tabs every 4 hrs as needed 9)  Valium 5 Mg Tabs (Diazepam) .... Take 1 tablet by mouth three times a day 10)  Gabapentin 300 Mg Caps (Gabapentin) .... Take 1 tablet by mouth once a day 11)  Ibu 800 Mg Tabs (Ibuprofen) .... Take 1 tablet by mouth three times a day 12)  Prednisone (pak) 5 Mg Tabs (Prednisone) .... Use as directed 13)  Flexeril 10 Mg Tabs (Cyclobenzaprine hcl) .... Take 1 tab by mouth at bedtime 14)  Vicodin Es 7.5-750 Mg Tabs (Hydrocodone-acetaminophen) .... Take 1 tab by mouth at bedtime 15)  Prozac 10 Mg Tabs (Fluoxetine hcl) .... Take 1 tablet by mouth once a day 16)  Catapres 0.2 Mg Tabs (Clonidine hcl) .... Take 1 tab by mouth at bedtime 17)  Omeprazole 20 Mg Tbec (Omeprazole) .... Take 1 tablet by mouth once a day 18)  Levothroid 150 Mcg Tabs (Levothyroxine sodium) .... One and half tabs by mouth  every mon - thurs and one tab by mouth on fri,sat,sun 19)  Betamethasone Cream  .... Apply topically to neck two times a day prn  Other Orders: Pap Smear (57322) Hemoccult Guaiac-1 spec.(in office) (02542)   Patient Instructions: 1)  Please schedule a follow-up appointment in 3 months. 2)  It is important that you exercise regularly at least 20 minutes 5 times a week. If you develop chest pain, have severe difficulty breathing, or feel very tired , stop exercising immediately and seek medical attention. 3)  You need to lose weight. Consider a lower calorie diet and regular exercise.  4)  please  increase the dose of your thyroid med as discussed. 5)  You have received an injection for your back pain. Pls cut down on fatty foods. 6)  You are referred to a dermatologist   Prescriptions: BETAMETHASONE CREAM apply topically to neck two times  a  day prn  #45gm x 0   Entered by:   Worthy Keeler LPN   Authorized by:   Syliva Overman MD   Signed by:   Syliva Overman MD on 10/24/2008   Method used:   Handwritten   RxID:   [id number] LEVOTHROID 150 MCG TABS (LEVOTHYROXINE SODIUM) one and half tabs by mouth  every mon - thurs and one tab by mouth on fri,sat,sun  #36 x 3   Entered and Authorized by:   Syliva Overman MD   Signed by:   Syliva Overman MD on 10/24/2008   Method used:   Handwritten   RxID:   [id number]     Laboratory Results  Date/Time Received: 10/21/08 3:00pm Date/Time Reported: 10/21/08 3:00pm  Stool - Occult Blood Hemmoccult #1: negative Date: 10/21/2008 Comments: 72536 11L 8/11 5067 10/11    Medication Administration  Injection # 1:    Medication: Ketorolac-Toradol 15mg     Diagnosis: BACK PAIN WITH RADICULOPATHY (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 Patient 40-64 years [99396]   Appended Document: office visit pls enter vision screen  Appended Document: office visit     Vital Signs:  Patient Profile:   60 Years Old Female              Vision Screening: Left eye w/o correction: 20 / 30 Right Eye w/o correction: 20 / 25 Both eyes w/o correction:  20/ 25  Color vision testing: normal      Vision Entered ByEveritt Amber (October 28, 2008 10:09 AM)     Current Allergies: ! SULFA ! NEOSPORIN        Complete Medication List: 1)  Niaspan 500 Mg Tbcr (Niacin (antihyperlipidemic)) .... Take four tablets by mouth at  bedtime 2)  Klor-con M20 20 Meq Tbcr (Potassium chloride crys cr) .... Take one tablet by mouth once a day 3)  Crestor 40 Mg Tabs (Rosuvastatin calcium) .... Take oe tablet by mouth once a day 4)  Miralax Powd (Polyethylene glycol 3350) .... As directed 5)  Cetirizine Hcl 10 Mg Tabs (Cetirizine hcl) .... Take one tablet by mouth once a day 6)  Fluticasone Propionate 50 Mcg/act Susp (Fluticasone propionate) .... Inhale one puff in each nostril twice a day 7)  Oscal 500/200 D-3 500-200 Mg-unit Tabs (Calcium-vitamin d) .... One tab by mouth tid 8)  Percocet 5-325 Mg Tabs (Oxycodone-acetaminophen) .... Take one to two tabs every 4 hrs as needed 9)  Valium 5 Mg Tabs (Diazepam) .... Take 1 tablet by mouth three times a day 10)  Gabapentin 300 Mg Caps (Gabapentin) .... Take 1 tablet by mouth once a day 11)  Ibu 800 Mg Tabs (Ibuprofen) .... Take 1 tablet by mouth three times a day 12)  Prednisone (pak) 5 Mg Tabs (Prednisone) .... Use as directed 13)  Flexeril 10 Mg Tabs (Cyclobenzaprine hcl) .... Take 1 tab by mouth at bedtime 14)  Vicodin Es 7.5-750 Mg Tabs (Hydrocodone-acetaminophen) .... Take 1 tab by mouth  at bedtime 15)  Prozac 10 Mg Tabs (Fluoxetine hcl) .... Take 1 tablet by mouth once a day 16)  Catapres 0.2 Mg Tabs (Clonidine hcl) .... Take 1 tab by mouth at bedtime 17)  Omeprazole 20 Mg Tbec (Omeprazole) .... Take 1 tablet by mouth once a day 18)  Levothroid 150 Mcg Tabs (Levothyroxine sodium) .... One and half tabs by mouth  every mon - thurs and one tab by mouth on fri,sat,sun 19)  Betamethasone Cream  .... Apply topically to neck two times a day prn

## 2010-10-11 NOTE — Progress Notes (Signed)
  Phone Note From Pharmacy   Caller: [facility] Summary of Call: insurance needs pa for [employer] 4mg  would you like to substitute? Initial call taken by: Adella Hare LPN,  November 01, 2009 4:42 PM

## 2010-10-11 NOTE — Miscellaneous (Signed)
Summary: Mammo  Clinical Lists Changes  Observations: Added new observation of MAMMO DUE: 09/2009 (09/24/2008 15:16) Added new observation of MAMMOGRAM: normal (09/14/2008 15:17)      Preventive Care Screening  Mammogram:    Date:  09/14/2008    Next Due:  09/2009    Results:  normal

## 2010-10-11 NOTE — Assessment & Plan Note (Signed)
Summary: OV   Vital Signs:  Patient profile:   60 year old female Menstrual status:  hysterectomy Height:      63 inches Weight:      220.19 pounds BMI:     39.15 O2 Sat:      94 % Pulse rate:   81 / minute Pulse rhythm:   regular Resp:     16 per minute BP sitting:   120 / 86  (left arm) Cuff size:   large  Vitals Entered By: Everitt Amber (March 10, 2009 9:57 AM)  Nutrition Counseling: Patient's BMI is greater than 25 and therefore counseled on weight management options. CC: Pain on urination and frequency for 2 weeks now  years   days  Menstrual Status hysterectomy Last PAP Result NEGATIVE FOR INTRAEPITHELIAL LESIONS OR MALIGNANCY.   CC:  Pain on urination and frequency for 2 weeks now.  History of Present Illness: Patient reports doing well.  Denies any recent fever or chills.  Denies any appetite change or change in bowel movements. Patient denies depression, anxiety or insomnia. She is here to review recent lab data, get a refill on her chronic meds as wellas to get some relief for her chronic pain. she denies head or chest congestion, she does have dysuria and frequency for the past 5 days. She denies chest pain or palpitations. She c/o inc back pain radiating down her legs with no specific aggravating factor for the past 4 days.  Current Medications (verified): 1)  Niaspan 500 Mg  Tbcr (Niacin (Antihyperlipidemic)) .... Take Four Tablets By Mouth At Bedtime 2)  Klor-Con M20 20 Meq  Tbcr (Potassium Chloride Crys Cr) .... Take One Tablet By Mouth Once A Day 3)  Crestor 40 Mg  Tabs (Rosuvastatin Calcium) .... Take Oe Tablet By Mouth Once A Day 4)  Miralax   Powd (Polyethylene Glycol 3350) .... As Directed 5)  Cetirizine Hcl 10 Mg  Tabs (Cetirizine Hcl) .... Take One Tablet By Mouth Once A Day 6)  Fluticasone Propionate 50 Mcg/act  Susp (Fluticasone Propionate) .... Inhale One Puff in Each Nostril Twice A Day 7)  Oscal 500/200 D-3 500-200 Mg-Unit Tabs (Calcium-Vitamin D)  .... One Tab By Mouth Tid 8)  Gabapentin 300 Mg Caps (Gabapentin) .... Take 1 Tablet By Mouth Once A Day 9)  Flexeril 10 Mg Tabs (Cyclobenzaprine Hcl) .... Take 1 Tab By Mouth At Bedtime 10)  Vicodin Es 7.5-750 Mg Tabs (Hydrocodone-Acetaminophen) .... Take 1 Tab By Mouth At Bedtime 11)  Prozac 10 Mg Tabs (Fluoxetine Hcl) .... Take 1 Tablet By Mouth Once A Day 12)  Catapres 0.2 Mg Tabs (Clonidine Hcl) .... Take 1 Tab By Mouth At Bedtime 13)  Omeprazole 20 Mg Tbec (Omeprazole) .... Take 1 Tablet By Mouth Once A Day 14)  Levothroid 150 Mcg Tabs (Levothyroxine Sodium) .... One and Half Tabs By Mouth  Every Mon - Thurs and One Tab By Mouth On Fri,sat,sun 15)  Betamethasone Cream .... Apply Topically To Neck Two Times A Day Prn 16)  Amitriptyline Hcl 100 Mg Tabs (Amitriptyline Hcl) .Marland Kitchen.. 1-2 Tabs At Bedtime  Allergies (verified): 1)  ! Sulfa 2)  ! Neosporin  Review of Systems      See HPI General:  See HPI. Eyes:  Denies blurring and discharge. ENT:  See HPI. CV:  See HPI. Resp:  See HPI. GI:  Denies abdominal pain, constipation, diarrhea, nausea, and vomiting. GU:  See HPI; Complains of dysuria and urinary frequency. MS:  Complains of  low back pain and mid back pain. Derm:  Denies itching and lesion(s). Neuro:  Denies headaches. Psych:  Complains of anxiety and depression; denies panic attacks, sense of great danger, suicidal thoughts/plans, thoughts of violence, and unusual visions or sounds. Endo:  Denies cold intolerance, excessive hunger, excessive urination, heat intolerance, polyuria, and weight change. Heme:  Complains of abnormal bruising and bleeding. Allergy:  Denies hives or rash and seasonal allergies.  Physical Exam  General:  alert, well-hydrated, and overweight-appearing.   HEENT: No facial asymmetry,  EOMI, No sinus tenderness, TM's Clear, oropharynx  pink and moist.   Chest: Clear to auscultation bilaterally.  CVS: S1, S2, No murmurs, No S3.   Abd: Soft, Nontender.    MS: decreased ROM spine, hips, shoulders and knees.  Ext: No edema.   CNS: CN 2-12 intact, power tone and sensation normal throughout.   Skin: Intact, no visible lesions or rashes.  Psych: Good eye contact, normal affect.  Memory intact, not anxious or depressed appearing.     Impression & Recommendations:  Problem # 1:  ACUTE CYSTITIS (ICD-595.0) Assessment Comment Only  Her updated medication list for this problem includes:    Ciprofloxacin Hcl 500 Mg Tabs (Ciprofloxacin hcl) .Marland Kitchen... 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 Take 1 tab by mouth at bedtime  Orders: T-Basic Metabolic Panel [phone])  BP today: 120/86 Prior BP: 120/80 (10/21/2008)  Labs Reviewed: K+: 4.1 (09/09/2008) Creat: : 0.87 (09/09/2008)   Chol: 182 (09/09/2008)   HDL: 51 (09/09/2008)   LDL: 102 (09/09/2008)   TG: 144 (09/09/2008)  Problem # 4:  HYPERLIPIDEMIA (ICD-272.4) Assessment: Comment Only  The following medications were removed from the medication list:    Niaspan 500 Mg Tbcr (Niacin (antihyperlipidemic)) .Marland Kitchen... Take four tablets by mouth at bedtime Her updated medication list for this problem includes:    Crestor 40 Mg Tabs (Rosuvastatin calcium) .Marland Kitchen... Take oe tablet by mouth once a day    Niaspan 500 Mg Cr-tabs (Niacin (antihyperlipidemic)) .Marland Kitchen... 2 tablets at bedtime  Orders: T-Lipid Profile [id number]) T-Hepatic Function [phone])  Labs Reviewed: SGOT: 18 (09/09/2008)   SGPT: 16 (09/09/2008)   HDL:51 (09/09/2008), 46  (18/84/1660)  LDL:102 (09/09/2008), 90 (63/09/6008)  Chol:182 (09/09/2008), 173 (02/24/2008)  Trig:144 (09/09/2008), 185 (02/24/2008)  Problem # 5:  GERD (ICD-530.81) Assessment: Unchanged  Her updated medication list for this problem includes:    Omeprazole 20 Mg Tbec (Omeprazole) .Marland Kitchen... Take 1 tablet by mouth once a day  Problem # 6:  DEPRESSION (ICD-311) Assessment: Unchanged  The following medications were removed from the medication list:    Valium 5 Mg Tabs (Diazepam) .Marland Kitchen... Take 1 tablet by mouth three times a day Her updated medication list for this problem includes:    Prozac 10 Mg Tabs (Fluoxetine hcl) .Marland Kitchen... Take 1 tablet by mouth once a day    Amitriptyline Hcl 100 Mg Tabs (Amitriptyline hcl) .Marland Kitchen... 1-2 tabs at bedtime  Problem # 7:  ALLERGIC RHINITIS (ICD-477.9) Assessment: Unchanged  Her updated medication list for this problem includes:    Cetirizine Hcl 10 Mg Tabs (Cetirizine hcl) .Marland KitchenMarland KitchenMarland KitchenMarland Kitchen  Take one tablet by mouth once a day    Fluticasone Propionate 50 Mcg/act Susp (Fluticasone propionate) ..... Inhale one puff in each nostril twice a day  Complete Medication List: 1)  Klor-con M20 20 Meq Tbcr (Potassium chloride crys cr) .... Take one tablet by mouth once a day 2)  Crestor 40 Mg Tabs (Rosuvastatin calcium) .... Take oe tablet by mouth once a day 3)  Miralax Powd (Polyethylene glycol 3350) .... As directed 4)  Cetirizine Hcl 10 Mg Tabs (Cetirizine hcl) .... Take one tablet by mouth once a day 5)  Fluticasone Propionate 50 Mcg/act Susp (Fluticasone propionate) .... Inhale one puff in each nostril twice a day 6)  Oscal 500/200 D-3 500-200 Mg-unit Tabs (Calcium-vitamin d) .... One tab by mouth tid 7)  Gabapentin 300 Mg Caps (Gabapentin) .... Take 1 tablet by mouth once a day 8)  Flexeril 10 Mg Tabs (Cyclobenzaprine hcl) .... Take 1 tab by mouth at bedtime 9)  Vicodin Es 7.5-750 Mg Tabs (Hydrocodone-acetaminophen) .... Take 1 tab by mouth at bedtime 10)  Prozac 10 Mg Tabs  (Fluoxetine hcl) .... Take 1 tablet by mouth once a day 11)  Catapres 0.2 Mg Tabs (Clonidine hcl) .... Take 1 tab by mouth at bedtime 12)  Omeprazole 20 Mg Tbec (Omeprazole) .... Take 1 tablet by mouth once a day 13)  Levothroid 150 Mcg Tabs (Levothyroxine sodium) .... One and half tabs by mouth  every mon - thurs and one tab by mouth on fri,sat,sun 14)  Betamethasone Cream  .... Apply topically to neck two times a day prn 15)  Amitriptyline Hcl 100 Mg Tabs (Amitriptyline hcl) .Marland Kitchen.. 1-2 tabs at bedtime 16)  Ciprofloxacin Hcl 500 Mg Tabs (Ciprofloxacin hcl) .... Take 1 tablet by mouth two times a day 17)  Niaspan 500 Mg Cr-tabs (Niacin (antihyperlipidemic)) .... 2 tablets at bedtime  Other Orders: T-TSH ([id number])  Patient Instructions: 1)  Please schedule a follow-up appointment in 4 months. 2)  It is important that you exercise regularly at least 20 minutes 5 times a week. If you develop chest pain, have severe difficulty breathing, or feel very tired , stop exercising immediately and seek medical attention. 3)  You need to lose weight. Consider a lower calorie diet and regular exercise.  4)  you are being treated for a uTI, meds are sent in, and you will get injections today for your back. 5)  BMP prior to visit, ICD-9: 6)  Hepatic Panel prior to visit, ICD-9:  fasting today. 7)  Lipid Panel prior to visit, ICD-9: 8)  TSH prior to visit, ICD-9: Prescriptions: NIASPAN 500 MG CR-TABS (NIACIN (ANTIHYPERLIPIDEMIC)) 2 tablets at bedtime  #60 x 4   Entered and Authorized by:   Syliva Overman MD   Signed by:   Syliva Overman MD on 03/11/2009   Method used:   Electronically to        [facility] (retail)       [address]       [place], [place]  [address]       Ph: [phone]       Fax: [phone]   RxID:   [id number] CIPROFLOXACIN HCL 500 MG TABS (CIPROFLOXACIN HCL) Take 1 tablet by mouth two times a day  #14 x 0   Entered and Authorized by:    Syliva Overman MD   Signed by:   Syliva Overman MD on 03/10/2009   Method used:  Electronically to        [facility] (retail)       [address]       [place], [place]  [address]       Ph: [phone]       Fax: [phone]   RxID:   [id number]       Laboratory Results   Urine Tests  Date/Time Received: 03/10/09 Date/Time Reported: 03/10/09  Routine Urinalysis   Color: yellow Appearance: Clear Glucose: negative   (Normal Range: Negative) Bilirubin: negative   (Normal Range: Negative) Ketone: negative   (Normal Range: Negative) Spec. 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 Patient Level IV [[address]] 2)  T-Basic Metabolic Panel [01027-25366] 3)  T-Lipid Profile [80061-22930] 4)  T-Hepatic Function [80076-22960] 5)  T-TSH [[id number]] 6)  T-Culture, Urine [[id number]] 7)  UA Dipstick W/ Micro (manual) [81000] 8)  Depo- Medrol 80mg  [J1040] 9)  Ketorolac-Toradol 15mg   [J1885] 10)  Admin of Therapeutic Inj  intramuscular or subcutaneous [29518]

## 2010-10-11 NOTE — Miscellaneous (Signed)
Summary: Refill  Clinical Lists Changes  Medications: Added new medication of FLEXERIL 10 MG TABS (CYCLOBENZAPRINE HCL) Take 1 tab by mouth at bedtime - Signed Rx of FLEXERIL 10 MG TABS (CYCLOBENZAPRINE HCL) Take 1 tab by mouth at bedtime;  #30 x 0;  Signed;  Entered by: Everitt Amber;  Authorized by: Syliva Overman MD;  Method used: Electronically to [facility] [address], [place], [place], [place]  [address], Ph: [phone], Fax: [phone]    Prescriptions: FLEXERIL 10 MG TABS (CYCLOBENZAPRINE HCL) Take 1 tab by mouth at bedtime  #30 x 0   Entered by:   Everitt Amber   Authorized by:   Syliva Overman MD   Signed by:   Everitt Amber on 12/15/2008   Method used:   Electronically to        [facility] (retail)       [address]       [place], [place]  [address]       Ph: [phone]       Fax: [phone]   RxID:   [id number]

## 2010-10-11 NOTE — Miscellaneous (Signed)
Summary: refill  Clinical Lists Changes  Medications: Added new medication of OSCAL 500/200 D-3 500-200 MG-UNIT TABS (CALCIUM-VITAMIN D) one tab by mouth tid - Signed Rx of OSCAL 500/200 D-3 500-200 MG-UNIT TABS (CALCIUM-VITAMIN D) one tab by mouth tid;  #100 x 3;  Signed;  Entered by: Worthy Keeler LPN;  Authorized by: Syliva Overman MD;  Method used: Electronically to [facility] [address], [place], [place], [place]  [address], Ph: [phone], Fax: [phone]    Prescriptions: OSCAL 500/200 D-3 500-200 MG-UNIT TABS (CALCIUM-VITAMIN D) one tab by mouth tid  #100 x 3   Entered by:   Worthy Keeler LPN   Authorized by:   Syliva Overman MD   Signed by:   Worthy Keeler LPN on 56/38/7564   Method used:   Electronically to        [facility] (retail)       [address]       [place], [place]  [address]       Ph: [phone]       Fax: [phone]   RxID:   [id number]

## 2010-10-11 NOTE — Assessment & Plan Note (Signed)
Summary: office visit   Vital Signs:  Patient Profile:   60 Years Old Female Height:     63 inches (160.02 cm) Weight:      221 pounds (100.45 kg) BMI:     39.29 BSA:     2.02 O2 Sat:      98 % Pulse rate:   67 / minute Pulse rhythm:   regular Resp:     14 per minute BP sitting:   140 / 92  (left arm)  Pt. in pain?   yes    Location:   lower back    Intensity:   8    Type:       burning  Vitals Entered By: Everitt Amber (September 09, 2008 7:59 AM)                  Chief Complaint:  Follow up and back has been hurting and has rash all over.  History of Present Illness: Pt generally doing fairly well. She was in the ED last week with acute back pain and was prescribedpercocet 5mg  #20 and valium 5mg  #20 with some relief.She fell the week before Thanksgiving. She had been seeing a neurologist for pain but has not been for several months.   She has L shoulder pain with decreased mobility for 2 weeks.  She has not required mental health hospitalisation since her last visit but states that she feels stressed and depressed. States that the pstch is more concerned about alcohol use so she gets drung.    Updated Prior Medication List: NIASPAN 500 MG  TBCR (NIACIN (ANTIHYPERLIPIDEMIC)) Take four tablets by mouth at bedtime KLOR-CON M20 20 MEQ  TBCR (POTASSIUM CHLORIDE CRYS CR) Take one tablet by mouth once a day CRESTOR 40 MG  TABS (ROSUVASTATIN CALCIUM) Take oe tablet by mouth once a day MIRALAX   POWD (POLYETHYLENE GLYCOL 3350) as directed CETIRIZINE HCL 10 MG  TABS (CETIRIZINE HCL) Take one tablet by mouith once ad ay FLUTICASONE PROPIONATE 50 MCG/ACT  SUSP (FLUTICASONE PROPIONATE) Inhale one puff in each nostril twice a day OSCAL 500/200 D-3 500-200 MG-UNIT TABS (CALCIUM-VITAMIN D) one tab by mouth tid PERCOCET 5-325 MG TABS (OXYCODONE-ACETAMINOPHEN) Take one to two tabs every 4 hrs as needed LEVOTHROID 150 MCG TABS (LEVOTHYROXINE SODIUM) Take one tab by mouth mon-Fri and one  half tab on sat and sun VALIUM 5 MG TABS (DIAZEPAM) Take 1 tablet by mouth three times a day GABAPENTIN 300 MG CAPS (GABAPENTIN) Take 1 tablet by mouth once a day IBU 800 MG TABS (IBUPROFEN) Take 1 tablet by mouth three times a day PREDNISONE (PAK) 5 MG TABS (PREDNISONE) Use as directed FLEXERIL 10 MG TABS (CYCLOBENZAPRINE HCL) Take 1 tab by mouth at bedtime VICODIN ES 7.5-750 MG TABS (HYDROCODONE-ACETAMINOPHEN) Take 1 tab by mouth at bedtime PROZAC 10 MG TABS (FLUOXETINE HCL) Take 1 tablet by mouth once a day CATAPRES 0.2 MG TABS (CLONIDINE HCL) Take 1 tab by mouth at bedtime  Current Allergies: ! SULFA ! NEOSPORIN     Review of Systems  General      Complains of fatigue and sleep disorder.      Denies chills and fever.      difficulty staying asleep and fallin asleep, anxious  ENT      Complains of postnasal drainage.      Denies hoarseness, nasal congestion, sinus pressure, and sore throat.  CV      Denies chest pain or discomfort, palpitations, shortness of breath with  exertion, and swelling of feet.  Resp      Denies cough, shortness of breath, sputum productive, and wheezing.  GI      Complains of constipation.      Denies abdominal pain, diarrhea, nausea, and vomiting.      On avg 1 bowel movement per week  GU      Denies dysuria, incontinence, urinary frequency, and urinary hesitancy.  MS      Complains of low back pain, mid back pain, cramps, and stiffness.      Back pain radistes to left leg  Derm      Complains of rash.      3  week history, puritic no insults noted to cause this  Psych      Complains of anxiety and depression.      Denies suicidal thoughts/plans, thoughts of violence, unusual visions or sounds, and thoughts /plans of harming others.  Endo      Denies cold intolerance, excessive hunger, excessive thirst, excessive urination, heat intolerance, polyuria, and weight change.   Physical Exam  General:      Well-developed,well-nourished,in no acute distress; alert,appropriate and cooperative throughout examination Head:     Normocephalic and atraumatic without obvious abnormalities. No apparent alopecia or balding. Eyes:     vision grossly intact.   Ears:     External ear exam shows no significant lesions or deformities.  Otoscopic examination reveals clear canals, tympanic membranes are intact bilaterally without bulging, retraction, inflammation or discharge. Hearing is grossly normal bilaterally. Nose:     no external deformity and no nasal discharge.   Mouth:     pharynx pink and moist and fair dentition.   Neck:     No deformities, masses, or tenderness noted. Lungs:     Normal respiratory effort, chest expands symmetrically. Lungs are clear to auscultation, no crackles or wheezes. Heart:     Normal rate and regular rhythm. S1 and S2 normal without gallop, murmur, click, rub or other extra sounds. Abdomen:     Bowel sounds positive,abdomen soft and non-tender without masses, organomegaly or hernias noted. Msk:     decreased ROM spine Extremities:     no edema Neurologic:     alert & oriented X3, cranial nerves II-XII intact, and strength normal in all extremities.   Skin:     erythematous maculapaular rash over trunk i no dermatomal pattern Cervical Nodes:     No lymphadenopathy noted Psych:     Cognition and judgment appear intact. Alert and cooperative with normal attention span and concentration. 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  Take one tab by mouth mon-fri and one half tab on sat and sun  Orders: T-TSH [id number]  Labs Reviewed: TSH: 1.451 (02/24/2008)    HgBA1c: 5.5 (09/09/2008) Chol: 173 (02/24/2008)   HDL: 46 (02/24/2008)   LDL: 90 (02/24/2008)   TG: 185 (02/24/2008)   Problem # 3:  HYPERTENSION (ICD-401.9) Assessment: Deteriorated  The following medications were removed from the medication list:    Catapres 0.1 Mg Tabs (Clonidine hcl) .Marland Kitchen... Take one tbalet by mouth at bedtime  Her updated medication  list for this problem includes:    Catapres 0.2 Mg Tabs (Clonidine hcl) .Marland Kitchen... Take 1 tab by mouth at bedtime  Orders: T-Basic Metabolic Panel [id number])  BP today: 140/92 Prior BP: 120/80 (02/24/2008)  Labs Reviewed: Creat: 0.78 (02/24/2008) Chol: 173 (02/24/2008)   HDL: 46 (02/24/2008)   LDL: 90 (02/24/2008)   TG: 185 (02/24/2008)   Problem # 4:  DEPRESSION (ICD-311) Assessment: Deteriorated  The following medications were removed from the medication list:    Amitriptyline Hcl 100 Mg Tabs (Amitriptyline hcl) .Marland Kitchen... Take one tablet by mouth at bedtime  Her updated medication list for this problem includes:    Valium 5 Mg Tabs (Diazepam) .Marland Kitchen... Take 1 tablet by mouth three times a day    Prozac 10 Mg Tabs (Fluoxetine hcl) .Marland Kitchen... Take 1 tablet by mouth once a day   Problem # 5:  ALLERGIC RHINITIS (ICD-477.9) Assessment: Comment Only  Her updated medication list for this problem includes:    Cetirizine Hcl 10 Mg Tabs (Cetirizine hcl) .Marland Kitchen... Take one tablet by mouth once a day    Fluticasone Propionate 50 Mcg/act Susp (Fluticasone propionate) ..... Inhale one puff in each nostril twice a day   Complete Medication List: 1)  Niaspan 500 Mg Tbcr (Niacin (antihyperlipidemic)) .... Take four tablets by mouth at bedtime 2)  Klor-con M20 20 Meq Tbcr (Potassium chloride crys cr) .... Take one tablet by mouth once a day 3)  Crestor 40 Mg Tabs (Rosuvastatin calcium) .... Take oe tablet by mouth once  a day 4)  Miralax Powd (Polyethylene glycol 3350) .... As directed 5)  Cetirizine Hcl 10 Mg Tabs (Cetirizine hcl) .... Take one tablet by mouth once a day 6)  Fluticasone Propionate 50 Mcg/act Susp (Fluticasone propionate) .... Inhale one puff in each nostril twice a day 7)  Oscal 500/200 D-3 500-200 Mg-unit Tabs (Calcium-vitamin d) .... One tab by mouth tid 8)  Percocet 5-325 Mg Tabs (Oxycodone-acetaminophen) .... Take one to two tabs every 4 hrs as needed 9)  Levothroid 150 Mcg Tabs (Levothyroxine sodium) .... Take one tab by mouth mon-fri and one half tab on sat and sun 10)  Valium 5 Mg Tabs (Diazepam) .... Take 1 tablet by mouth three times a day 11)  Gabapentin 300 Mg Caps (Gabapentin) .... Take 1 tablet by mouth once a day 12)  Ibu 800 Mg Tabs (Ibuprofen) .... Take 1 tablet by mouth three times a day 13)  Prednisone (pak) 5 Mg Tabs (Prednisone) .... Use as directed 14)  Flexeril 10 Mg Tabs (Cyclobenzaprine hcl) .... Take 1 tab by mouth at bedtime 15)  Vicodin Es 7.5-750 Mg Tabs (Hydrocodone-acetaminophen) .... Take 1 tab by mouth at bedtime 16)  Prozac 10 Mg Tabs (Fluoxetine hcl) .... Take 1 tablet by mouth once a day 17)  Catapres 0.2 Mg Tabs (Clonidine hcl) .... Take 1 tab by mouth at bedtime 18)  Omeprazole 20 Mg Tbec (Omeprazole) .... Take 1 tablet by mouth once a day  Other Orders: T-Hepatic Function [id number]) T-Lipid Profile [id number]) Hemoglobin A1C (72536) Radiology Referral (Radiology) Gastroenterology Referral (GI) H1N1 vaccine G code (U4403)   Patient Instructions: 1)  CPE in 6 weeks. 2)  Mamogram past due , we will schedule. 3)  Schedule a colonoscopy/sigmoidoscopy to help detect colon cancer.We will refer you to Dr. Renae Fickle. 4)  You will be given injections for influenza, toradol, depomedrol and meds are being sent to [facility] pharmacy. 5)  BMP prior to visit, ICD-9: 6)  Hepatic Panel prior to visit,  ICD-9:  today. 7)  Lipid Panel prior to visit, ICD-9: 8)  TSH  prior to visit, ICD-9: 9)  CBC w/ Diff prior to visit, ICD-9:   Prescriptions: CETIRIZINE HCL 10 MG  TABS (CETIRIZINE HCL) Take one tablet by mouth once a day  #30 x 4   Entered by:   Everitt Amber   Authorized by:   Syliva Overman MD   Signed by:   Everitt Amber on 09/09/2008   Method used:   Electronically to        [facility] (retail)       [address]       [place], [place]  [address]       Ph: [phone]       Fax: [phone]   RxID:   [id number] OMEPRAZOLE 20 MG TBEC (OMEPRAZOLE) Take 1 tablet by mouth once a day  #30 x 4   Entered by:   Everitt Amber   Authorized by:   Syliva Overman MD   Signed by:   Everitt Amber on 09/09/2008   Method used:   Electronically to        [facility] (retail)       [address]       [place], [place]  [address]       Ph: [phone]       Fax: [phone]   RxID:   [id number] CATAPRES 0.2 MG TABS (CLONIDINE HCL) Take 1 tab by mouth at bedtime  #30 x 2   Entered and Authorized by:   Syliva Overman MD   Signed by:   Syliva Overman MD on 09/09/2008   Method used:   Electronically to        [facility] (retail)       [address]       [place], [place]  [address]       Ph: [phone]       Fax: [phone]   RxID:   [id number] PROZAC 10 MG TABS (FLUOXETINE HCL) Take 1 tablet by mouth once a day  #30 x 1   Entered and Authorized by:   Syliva Overman MD   Signed by:   Syliva Overman MD on 09/09/2008   Method used:   Electronically to        [facility] (retail)       [address]       [place], [place]  [address]       Ph: [phone]       Fax: [phone]   RxID:   [id number] VICODIN ES 7.5-750 MG TABS (HYDROCODONE-ACETAMINOPHEN) Take 1 tab by mouth at bedtime  #30 x 0   Entered and Authorized by:   Syliva Overman MD   Signed by:   Syliva Overman MD on 09/09/2008   Method used:    Printed then faxed to ...       [facility] (retail)       [address]       [place], [place]  [address]       Ph: [phone]       Fax: [phone]   RxID:   [id number] FLEXERIL 10  MG TABS (CYCLOBENZAPRINE HCL) Take 1 tab by mouth at bedtime  #30 x 0   Entered and Authorized by:   Syliva Overman MD   Signed by:   Syliva Overman MD on 09/09/2008   Method used:   Electronically to        [facility] (retail)       [address]       [place], [place]  [address]       Ph: [phone]       Fax: [phone]   RxID:   [id number] PREDNISONE (PAK) 5 MG TABS (PREDNISONE) Use as directed  #21 x 0   Entered and Authorized by:   Syliva Overman MD   Signed by:   Syliva Overman MD on 09/09/2008   Method used:   Electronically to        [facility] (retail)       [address]       [place], [place]  [address]       Ph: [phone]       Fax: [phone]   RxID:   [id number] IBU 800 MG TABS (IBUPROFEN) Take 1 tablet by mouth three times a day  #30 x 0   Entered and Authorized by:   Syliva Overman MD   Signed by:   Syliva Overman MD on 09/09/2008   Method used:   Electronically to        [facility] (retail)       [address]       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 Patient Level IV [99214] 2)  T-Basic Metabolic Panel [80048-22910] 3)  T-Hepatic Function [80076-22960] 4)  T-Lipid Profile [80061-22930] 5)  T-TSH [51761-60737] 6)  Hemoglobin A1C [83036] 7)  Radiology Referral [Radiology] 8)  Depo- Medrol 80mg  [J1040] 9)  Ketorolac-Toradol 15mg  [J1885] 10)  Admin of Therapeutic Inj  intramuscular or subcutaneous [96372] 11)  Gastroenterology Referral [GI] 12)  H1N1 vaccine G code [T0626]  H1N1 # 1    Vaccine Type: H1N1 vaccine G code    Site: left deltoid    Mfr: CSL    Dose: 0.5 ml    Route: IM    Given by: Everitt Amber    Exp. Date: 03/10/2009    Lot #: [id number]

## 2010-10-11 NOTE — Progress Notes (Signed)
Summary: [facility]   Imported By: Lind Guest 10/20/2008 13:28:06  _____________________________________________________________________  External Attachment:    Type:   Image     Comment:   External Document

## 2010-10-11 NOTE — Assessment & Plan Note (Signed)
Summary: urine  Nurse Visit   Vital Signs:  Patient profile:   60 year old female Menstrual status:  hysterectomy Height:      63 inches Weight:      240 pounds BMI:     42.67 O2 Sat:      95 % Pulse rate:   76 / minute Resp:     16 per minute BP sitting:   110 / 70 Cuff size:   large  Vitals Entered By: Everitt Amber LPN (Jan 10, 9561 11:06 AM) CC: bad odor to urine, no burning or frequency   Allergies: 1)  ! Sulfa 2)  ! Neosporin 3)  ! Pcn Laboratory Results   Urine Tests    Routine Urinalysis   Color: yellow Appearance: Clear Glucose: negative   (Normal Range: Negative) Bilirubin: negative   (Normal Range: Negative) Ketone: negative   (Normal Range: Negative) Spec. Gravity: 1.020   (Normal Range: 1.003-1.035) Blood: negative   (Normal Range: Negative) pH: 6.0   (Normal Range: 5.0-8.0) Protein: negative   (Normal Range: Negative) Urobilinogen: 0.2   (Normal Range: 0-1) Nitrite: negative   (Normal Range: Negative) Leukocyte Esterace: trace   (Normal Range: Negative)       Orders Added: 1)  UA Dipstick W/ Micro (manual) [81000] 2)  T-Culture, Urine [[id number]]

## 2010-10-11 NOTE — Letter (Signed)
Summary: MAMMOGRAM REPORT  MAMMOGRAM REPORT   Imported By: Lind Guest 09/24/2008 15:45:03  _____________________________________________________________________  External Attachment:    Type:   Image     Comment:   External Document

## 2010-10-11 NOTE — Progress Notes (Signed)
Summary: medication  Phone Note Call from Patient   Summary of Call: patient called in and requested one more refill on 4 of her medications, she states she has moved to [place], but just found a new doctor and she can't see them unitl Aug. 30.  She needs levothroid 150 mg, flexeril 10 mg, oxybutynin 5 mg, and vicodin.  Her new number is [phone] , she would like these called into [facility] in [place] [phone] Initial call taken by: Curtis Sites,  April 13, 2010 3:06 PM    Prescriptions: OXYBUTYNIN CHLORIDE 5 MG TABS (OXYBUTYNIN CHLORIDE) Take 1 tablet by mouth two times a day  #60 x 0   Entered by:   Everitt Amber LPN   Authorized by:   Syliva Overman MD   Signed by:   Everitt Amber LPN on 14/78/2956   Method used:   Printed then faxed to ...       [facility] (retail)       [address]       [place], [place]  [address]       Ph: [phone]       Fax: [phone]   RxID:   [id number] LEVOTHROID 150 MCG TABS (LEVOTHYROXINE SODIUM) one and half tabs by mouth  every mon - thurs and one tab by mouth on fri,sat,sun  #30 x 0   Entered by:   Everitt Amber LPN   Authorized by:   Syliva Overman MD   Signed by:   Everitt Amber LPN on 25/95/6387   Method used:   Printed then faxed to ...       [facility] (retail)       [address]       [place], [place]  [address]       Ph: [phone]       Fax: [phone]   RxID:   [id number] VICODIN ES 7.5-750 MG TABS (HYDROCODONE-ACETAMINOPHEN) Take 1 tab by mouth at bedtime  #30 x 0   Entered by:   Everitt Amber LPN   Authorized by:   Syliva Overman MD   Signed by:   Everitt Amber LPN on 62/37/6283   Method used:   Printed then faxed to ...       [facility] (retail)       [address]       [place], [place]  [address]       Ph: [phone]       Fax: [phone]   RxID:   [id number] FLEXERIL 10 MG TABS (CYCLOBENZAPRINE HCL) Take 1 tab  by mouth at bedtime  #30 Each x 0   Entered by:   Everitt Amber LPN   Authorized by:   Syliva Overman MD   Signed by:   Everitt Amber LPN on 96/78/9381   Method used:   Printed then faxed to ...       [facility] (retail)       [address]       [place], [place]  [address]       Ph: [phone]       Fax: [phone]   RxID:   [id number]

## 2010-10-11 NOTE — Assessment & Plan Note (Signed)
Summary: OFFICE VISIT   Vital Signs:  Patient profile:   60 year old female Menstrual status:  hysterectomy Height:      63 inches Weight:      222.25 pounds BMI:     39.51 O2 Sat:      97 % Pulse rate:   96 / minute Pulse rhythm:   regular Resp:     16 per minute BP sitting:   140 / 82  (left arm) Cuff size:   large  Vitals Entered By: Everitt Amber (July 12, 2009 8:35 AM)  Nutrition Counseling: Patient's BMI is greater than 25 and therefore counseled on weight management options. CC: She needs something for her bladder, she is having problems controlling it   CC:  She needs something for her bladder and she is having problems controlling it.  History of Present Illness: Pt in today with a left boot states ortho put her in this 3 weks ago and now she needs help to clean her home.I have advised that when i get more details from ortho, I will sign of on this need. She reports inc symptoms of depression on the last approx 3 months, and has been wanting to start alcohol use again, which i advise against, but rather that she seek professional therapy on a frequent basis. She c/o uncontrolled incontinence and wants help. She has had no reent fever or chills, and she suffers from chronic fatigue.   Current Medications (verified): 1)  Klor-Con M20 20 Meq  Tbcr (Potassium Chloride Crys Cr) .... Take One Tablet By Mouth Once A Day 2)  Crestor 40 Mg  Tabs (Rosuvastatin Calcium) .... Take Oe Tablet By Mouth Once A Day 3)  Miralax   Powd (Polyethylene Glycol 3350) .... As Directed 4)  Fluticasone Propionate 50 Mcg/act  Susp (Fluticasone Propionate) .... Inhale One Puff in Each Nostril Twice A Day 5)  Oscal 500/200 D-3 500-200 Mg-Unit Tabs (Calcium-Vitamin D) .... One Tab By Mouth Tid 6)  Gabapentin 300 Mg Caps (Gabapentin) .... Take 1 Tablet By Mouth Once A Day 7)  Flexeril 10 Mg Tabs (Cyclobenzaprine Hcl) .... Take 1 Tab By Mouth At Bedtime 8)  Vicodin Es 7.5-750 Mg Tabs  (Hydrocodone-Acetaminophen) .... Take 1 Tab By Mouth At Bedtime 9)  Prozac 10 Mg Tabs (Fluoxetine Hcl) .... Take 1 Tablet By Mouth Once A Day 10)  Catapres 0.2 Mg Tabs (Clonidine Hcl) .... Take 1 Tab By Mouth At Bedtime 11)  Omeprazole 20 Mg Tbec (Omeprazole) .... Take 1 Tablet By Mouth Once A Day 12)  Levothroid 150 Mcg Tabs (Levothyroxine Sodium) .... One and Half Tabs By Mouth  Every Mon - Thurs and One Tab By Mouth On Fri,sat,sun 13)  Amitriptyline Hcl 100 Mg Tabs (Amitriptyline Hcl) .Marland Kitchen.. 1-2 Tabs At Bedtime 14)  Niaspan 500 Mg Cr-Tabs (Niacin (Antihyperlipidemic)) .... 2 Tablets At Bedtime 15)  Diclofenac Sodium 75 Mg Tbec (Diclofenac Sodium) .... One Tab By Mouth Qd 16)  Depakote Er 250 Mg Xr24h-Tab (Divalproex Sodium) .... Take 1 Tab By Mouth At Bedtime  Allergies (verified): 1)  ! Sulfa 2)  ! Neosporin  Review of Systems      See HPI ENT:  Denies earache and sinus pressure. CV:  Denies chest pain or discomfort, lightheadness, near fainting, and palpitations. GI:  Denies abdominal pain, constipation, diarrhea, nausea, and vomiting. GU:  Complains of incontinence and nocturia; denies dysuria, urinary frequency, and urinary hesitancy; worsening in the last 4 weeks, up about 3 times per  night. MS:  See HPI; Complains of joint pain, low back pain, muscle aches, and stiffness. Neuro:  Complains of headaches; denies seizures and sensation of room spinning. Psych:  Complains of anxiety, depression, easily tearful, irritability, and mental problems; denies suicidal thoughts/plans and thoughts of violence; anhidonia. Endo:  Denies cold intolerance, excessive hunger, excessive thirst, excessive urination, heat intolerance, polyuria, and weight change. Heme:  Denies abnormal bruising and bleeding.  Physical Exam  General:  alert, well-hydrated, and overweight-appearing. HEENT: No facial asymmetry,  EOMI, No sinus tenderness, TM's Clear, oropharynx  pink and moist.   Chest: Clear to  auscultation bilaterally.  CVS: S1, S2, No murmurs, No S3.   Abd: Soft, Nontender.  MS: decreased  ROM spine, hips, shoulders and knees.left leg immobilised ina boot  Ext: No edema.   CNS: CN 2-12 intact, power tone and sensation normal throughout.   Skin: Intact, no visible lesions or rashes.  Psych: Poor eye contact, flat affect.  Memory intact,  depressed appearing.      Impression & Recommendations:  Problem # 1:  FOOT PAIN, LEFT (ICD-729.5) Assessment Comment Only ortho managing the problem  Problem # 2:  ACUTE CYSTITIS (ICD-595.0) Assessment: Comment Only  The following medications were removed from the medication list:    Ciprofloxacin Hcl 500 Mg Tabs (Ciprofloxacin hcl) .Marland Kitchen... Take 1 tablet by mouth two times a day    Penicillin V Potassium 500 Mg Tabs (Penicillin v potassium) .Marland Kitchen... Take 1 tablet by mouth three times a day Her updated medication list for this problem includes:    Detrol La 4 Mg Xr24h-cap (Tolterodine tartrate) .Marland Kitchen... Take 1 capsule by mouth once a day    Cipro 500 Mg Tabs (Ciprofloxacin hcl) ..... One tab by mouth bid  Orders: T-Culture, Urine ([id number]) UA Dipstick W/ Micro (manual) (67619)  Problem # 3:  IMPAIRED GLUCOSE TOLERANCE (ICD-271.3) Assessment: Comment Only  Orders: Hemoglobin A1C (83036) 5.7 , which is normal  Problem # 4:  HYPERTENSION (ICD-401.9) Assessment: Deteriorated  Her updated medication list for this problem includes:    Catapres 0.2 Mg Tabs (Clonidine hcl) .Marland Kitchen... Take 1 tab by mouth at bedtime  Orders: T-Basic Metabolic Panel [id number])  BP today: 140/82 Prior BP: 110/74 (05/26/2009)  Labs Reviewed: K+: 4.0 (03/10/2009) Creat: : 0.79 (03/10/2009)   Chol: 97 (03/10/2009)   HDL: 48 (03/10/2009)   LDL: 29 (03/10/2009)   TG: 98 (03/10/2009)  Problem # 5:  DEPRESSION (ICD-311) Assessment: Deteriorated  Her updated medication list for this problem includes:    Prozac 10 Mg Tabs (Fluoxetine hcl) .Marland Kitchen... Take 1 tablet  by mouth once a day    Amitriptyline Hcl 100 Mg Tabs (Amitriptyline hcl) .Marland Kitchen... 1-2 tabs at bedtime  Problem # 6:  HEADACHE (ICD-784.0) Assessment: Improved  Her updated medication list for this problem includes:    Vicodin Es 7.5-750 Mg Tabs (Hydrocodone-acetaminophen) .Marland Kitchen... Take 1 tab by mouth at bedtime    Diclofenac Sodium 75 Mg Tbec (Diclofenac sodium) ..... One tab by mouth qd  Headache diary reviewed.  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 Take one tablet by mouth once a day 2)  Crestor 40 Mg Tabs (Rosuvastatin  calcium) .... Take oe tablet by mouth once a day 3)  Miralax Powd (Polyethylene glycol 3350) .... As directed 4)  Fluticasone Propionate 50 Mcg/act Susp (Fluticasone propionate) .... Inhale one puff in each nostril twice a day 5)  Oscal 500/200 D-3 500-200 Mg-unit Tabs (Calcium-vitamin d) .... One tab by mouth tid 6)  Gabapentin 300 Mg Caps (Gabapentin) .... Take 1 tablet by mouth once a day 7)  Flexeril 10 Mg Tabs (Cyclobenzaprine hcl) .... Take 1 tab by mouth at bedtime 8)  Vicodin Es 7.5-750 Mg Tabs (Hydrocodone-acetaminophen) .... Take 1 tab by mouth at bedtime 9)  Prozac 10 Mg Tabs (Fluoxetine hcl) .... Take 1 tablet by mouth once a day 10)  Catapres 0.2 Mg Tabs (Clonidine hcl) .... Take 1 tab by mouth at bedtime 11)  Omeprazole 20 Mg Tbec (Omeprazole) .... Take 1 tablet by mouth once a day 12)  Levothroid 150 Mcg Tabs (Levothyroxine sodium) .... One and half tabs by mouth  every mon - thurs and one tab by mouth on fri,sat,sun 13)  Amitriptyline Hcl 100 Mg Tabs  (Amitriptyline hcl) .Marland Kitchen.. 1-2 tabs at bedtime 14)  Niaspan 500 Mg Cr-tabs (Niacin (antihyperlipidemic)) .... 2 tablets at bedtime 15)  Diclofenac Sodium 75 Mg Tbec (Diclofenac sodium) .... One tab by mouth qd 16)  Depakote Er 250 Mg Xr24h-tab (Divalproex sodium) .... Take 1 tab by mouth at bedtime 17)  Detrol La 4 Mg Xr24h-cap (Tolterodine tartrate) .... Take 1 capsule by mouth once a day 18)  Cipro 500 Mg Tabs (Ciprofloxacin hcl) .... One tab by mouth bid  Other Orders: T-Lipid Profile [id number]) T-Hepatic Function [phone]) T-CBC w/Diff [id number]) T-TSH [id number]) Influenza Vaccine NON MCR (28413)  Patient Instructions: 1)  Please schedule a follow-up appointment in 3.5 months. 2)  BMP prior to visit, ICD-9: 3)  Hepatic Panel prior to visit, ICD-9: 4)  Lipid Panel prior to visit, ICD-9:   fasting itoday. 5)  TSH prior to visit, ICD-9: 6)  CBC w/ Diff prior to visit, ICD-9: 7)  New med will be started fr incontinence. 8)  Shot today for back pain, and flu vaccine Prescriptions: CIPRO 500 MG TABS (CIPROFLOXACIN HCL) one tab by mouth bid  #10 x 0   Entered by:   Worthy Keeler LPN   Authorized by:   Syliva Overman MD   Signed by:   Worthy Keeler LPN on 24/40/1027   Method used:   Electronically to        [facility] (retail)       [address]       [place], [place]  [address]       Ph: [phone]       Fax: [phone]   RxID:   [id number] CATAPRES 0.2 MG TABS (CLONIDINE HCL) Take 1 tab by mouth at bedtime  #30 Each x 5   Entered by:   Worthy Keeler LPN   Authorized by:   Syliva Overman MD   Signed by:   Worthy Keeler LPN on 09/19/3233   Method used:   Electronically to        [facility] (retail)       [address]       [place], [place]  [address]       Ph: [phone]       Fax: [phone]   RxID:   [id number] DETROL  LA 4 MG XR24H-CAP (TOLTERODINE TARTRATE) Take 1 capsule by mouth once a  day  #30 x 3   Entered and Authorized by:   Syliva Overman MD   Signed by:   Syliva Overman MD on 07/12/2009   Method used:   Electronically to        [facility] (retail)       [address]       [place], [place]  [address]       Ph: [phone]       Fax: [phone]   RxID:   [id number]     Influenza Vaccine    Vaccine Type: Fluvax Non-MCR    Site: left deltoid    Mfr: novartis    Dose: 0.5 ml    Route: IM    Given by: Worthy Keeler LPN    Exp. Date: 04/11    Lot #: [id number]    VIS given: 04/04/07 version given July 12, 2009.   Laboratory Results   Urine Tests  Date/Time Received: 07/12/09 Date/Time Reported: 07/12/09  Routine Urinalysis   Color: yellow Appearance: Clear Glucose: negative   (Normal Range: Negative) Bilirubin: negative   (Normal Range: Negative) Ketone: negative   (Normal Range: Negative) Spec. 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 Patient Level IV [47425] 2)  T-Basic Metabolic Panel [[id number]] 3)  T-Lipid Profile [80061-22930] 4)  T-Hepatic Function [80076-22960] 5)  T-CBC w/Diff [32951-88416] 6)  T-TSH [60630-16010] 7)  T-Culture, Urine [[id number]] 8)  Influenza Vaccine NON MCR [00028] 9)  Ketorolac-Toradol 15mg  [J1885] 10)   Admin of Therapeutic Inj  intramuscular or subcutaneous [96372] 11)  UA Dipstick W/ Micro (manual) [81000] 12)  Hemoglobin A1C [83036]   Appended Document: OFFICE VISIT pls do not bill the HBA1C done in the office as this was also done [facility]  Appended Document: OFFICE VISIT this will be removed

## 2010-10-11 NOTE — Progress Notes (Signed)
Summary: referral  Phone Note Call from Patient   Summary of Call: Came in for nurse visit for [facility]. She is still having the problems with the rash that she saw Dawn with. States that when she rubs or scratches any part of her body it turns red and sometimes swells up for hours at a time. Was given Loratadine and Depo medrol and she is still having problems with it. Does she need to see dermatology? Initial call taken by: Everitt Amber LPN,  Jan 10, 1609 11:13 AM  Follow-up for Phone Call        pls refer pt to dermatology to eval and treat puritic rash Follow-up by: Syliva Overman MD,  Jan 10, 2010 11:44 AM  Additional Follow-up for Phone Call Additional follow up Details #1::        pt hjas appt with amber on 01/14/2010 10:00. pt notified  Additional Follow-up by: Rudene Anda,  Jan 10, 2010 12:02 PM

## 2010-10-13 NOTE — Progress Notes (Signed)
Summary: WILL YOU TAKE HER BACK AS A PATIENT  Phone Note Call from Patient   Summary of Call: WANTS TO KNOW  WILL YOU TAKE HER BACK OR REFER HER TO SOMEONE TOLD HER I DOUBT IT BECAUSE YOU ARE NOT TAKING NEW PATIENTS CALL BACK AT [phone] Initial call taken by: Lind Guest,  September 26, 2010 2:12 PM  Follow-up for Phone Call        she was here in May lastyr, if she actually moved her records then I recommend any doc in [place] accepting new pts Follow-up by: Syliva Overman MD,  September 26, 2010 3:52 PM  Additional Follow-up for Phone Call Additional follow up Details #1::        I DO NOT SEE WHERE SHE RELEASED RECORDS AND I CALLED HER AND SHE SAID THAT SHE SEEN A DR IN [place] AND SHE STOPPED SEEING HIM IN [place] AND WANTS TO COME BACK HERE PLEASE Additional Follow-up by: Lind Guest,  September 27, 2010 10:32 AM    Additional Follow-up for Phone Call Additional follow up Details #2::    no appt available for her , sorry Follow-up by: Syliva Overman MD,  September 27, 2010 5:28 PM  Additional Follow-up for Phone Call Additional follow up Details #3:: Details for Additional Follow-up Action Taken: Jacole is aware Additional Follow-up by: Lind Guest,  September 28, 2010 11:22 AM

## 2010-11-15 ENCOUNTER — Encounter (INDEPENDENT_AMBULATORY_CARE_PROVIDER_SITE_OTHER): Payer: Self-pay | Admitting: Psychiatry

## 2010-11-15 DIAGNOSIS — F39 Unspecified mood [affective] disorder: Secondary | ICD-10-CM

## 2010-11-21 ENCOUNTER — Encounter (INDEPENDENT_AMBULATORY_CARE_PROVIDER_SITE_OTHER): Payer: Medicare HMO | Admitting: Psychiatry

## 2010-11-21 DIAGNOSIS — F39 Unspecified mood [affective] disorder: Secondary | ICD-10-CM

## 2010-11-24 ENCOUNTER — Encounter (INDEPENDENT_AMBULATORY_CARE_PROVIDER_SITE_OTHER): Payer: Medicare HMO | Admitting: Psychiatry

## 2010-11-24 DIAGNOSIS — F39 Unspecified mood [affective] disorder: Secondary | ICD-10-CM

## 2010-11-29 NOTE — Letter (Signed)
Summary: office notes  office notes   Imported By: Lind Guest 11/23/2010 11:12:22  _____________________________________________________________________  External Attachment:    Type:   Image     Comment:   External Document

## 2010-11-29 NOTE — Letter (Signed)
Summary: history and physical  history and physical   Imported By: Lind Guest 11/23/2010 11:09:08  _____________________________________________________________________  External Attachment:    Type:   Image     Comment:   External Document

## 2010-11-29 NOTE — Letter (Signed)
Summary: misc  misc   Imported By: Lind Guest 11/23/2010 11:11:01  _____________________________________________________________________  External Attachment:    Type:   Image     Comment:   External Document

## 2010-11-29 NOTE — Letter (Signed)
Summary: consults  consults   Imported By: Lind Guest 11/23/2010 11:06:55  _____________________________________________________________________  External Attachment:    Type:   Image     Comment:   External Document

## 2010-11-29 NOTE — Letter (Signed)
Summary: labs  labs   Imported By: Lind Guest 11/23/2010 11:09:41  _____________________________________________________________________  External Attachment:    Type:   Image     Comment:   External Document

## 2010-11-29 NOTE — Letter (Signed)
Summary: telephone notes  telephone notes   Imported By: Lind Guest 11/23/2010 11:14:35  _____________________________________________________________________  External Attachment:    Type:   Image     Comment:   External Document

## 2010-11-29 NOTE — Letter (Signed)
Summary: x rays  x rays   Imported By: Lind Guest 11/23/2010 11:15:25  _____________________________________________________________________  External Attachment:    Type:   Image     Comment:   External Document

## 2010-11-29 NOTE — Letter (Signed)
Summary: demo  demo   Imported By: Lind Guest 11/23/2010 11:08:36  _____________________________________________________________________  External Attachment:    Type:   Image     Comment:   External Document
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
## 2010-12-08 ENCOUNTER — Encounter (HOSPITAL_COMMUNITY): Payer: Medicare HMO | Admitting: Psychiatry

## 2010-12-09 ENCOUNTER — Emergency Department (HOSPITAL_COMMUNITY): Payer: Medicare HMO

## 2010-12-09 ENCOUNTER — Encounter (HOSPITAL_COMMUNITY): Payer: Self-pay | Admitting: Radiology

## 2010-12-09 ENCOUNTER — Inpatient Hospital Stay (HOSPITAL_COMMUNITY)
Admission: EM | Admit: 2010-12-09 | Discharge: 2010-12-12 | DRG: 153 | Disposition: A | Discharge status: Home / Self Care | Payer: Medicare HMO | Attending: Internal Medicine | Admitting: Internal Medicine

## 2010-12-09 DIAGNOSIS — E785 Hyperlipidemia, unspecified: Secondary | ICD-10-CM | POA: Diagnosis present

## 2010-12-09 DIAGNOSIS — J309 Allergic rhinitis, unspecified: Secondary | ICD-10-CM | POA: Diagnosis present

## 2010-12-09 DIAGNOSIS — E039 Hypothyroidism, unspecified: Secondary | ICD-10-CM | POA: Diagnosis present

## 2010-12-09 DIAGNOSIS — F329 Major depressive disorder, single episode, unspecified: Secondary | ICD-10-CM | POA: Diagnosis present

## 2010-12-09 DIAGNOSIS — J329 Chronic sinusitis, unspecified: Principal | ICD-10-CM | POA: Diagnosis present

## 2010-12-09 DIAGNOSIS — K219 Gastro-esophageal reflux disease without esophagitis: Secondary | ICD-10-CM | POA: Diagnosis present

## 2010-12-09 DIAGNOSIS — I10 Essential (primary) hypertension: Secondary | ICD-10-CM | POA: Diagnosis present

## 2010-12-09 DIAGNOSIS — F3289 Other specified depressive episodes: Secondary | ICD-10-CM | POA: Diagnosis present

## 2010-12-09 HISTORY — DX: Essential (primary) hypertension: I10

## 2010-12-09 LAB — DIFFERENTIAL
Basophils Absolute: 0 10*3/uL (ref 0.0–0.1)
Basophils Relative: 0 % (ref 0–1)
Eosinophils Absolute: 0 10*3/uL (ref 0.0–0.7)
Eosinophils Relative: 0 % (ref 0–5)
Lymphocytes Relative: 9 % — ABNORMAL LOW (ref 12–46)
Lymphs Abs: 0.6 10*3/uL — ABNORMAL LOW (ref 0.7–4.0)
Monocytes Absolute: 0.1 10*3/uL (ref 0.1–1.0)
Monocytes Relative: 1 % — ABNORMAL LOW (ref 3–12)
Neutro Abs: 6 10*3/uL (ref 1.7–7.7)
Neutrophils Relative %: 90 % — ABNORMAL HIGH (ref 43–77)

## 2010-12-09 LAB — BASIC METABOLIC PANEL
BUN: 9 mg/dL (ref 6–23)
CO2: 28 mEq/L (ref 19–32)
Calcium: 9.1 mg/dL (ref 8.4–10.5)
Chloride: 103 mEq/L (ref 96–112)
Creatinine, Ser: 0.69 mg/dL (ref 0.4–1.2)
GFR calc Af Amer: >60 mL/min (ref >60)
GFR calc non Af Amer: >60 mL/min (ref >60)
Glucose, Bld: 101 mg/dL — ABNORMAL HIGH (ref 70–99)
Potassium: 3.8 mEq/L (ref 3.5–5.1)
Sodium: 139 mEq/L (ref 135–145)

## 2010-12-09 LAB — CBC
HCT: 36.3 % (ref 36.0–46.0)
Hemoglobin: 11.3 g/dL — ABNORMAL LOW (ref 12.0–15.0)
MCH: 25 pg — ABNORMAL LOW (ref 26.0–34.0)
MCHC: 31.1 g/dL (ref 30.0–36.0)
MCV: 80.3 fL (ref 78.0–100.0)
Platelets: 236 10*3/uL (ref 150–400)
RBC: 4.52 MIL/uL (ref 3.87–5.11)
RDW: 14.6 % (ref 11.5–15.5)
WBC: 6.6 10*3/uL (ref 4.0–10.5)

## 2010-12-09 LAB — BRAIN NATRIURETIC PEPTIDE: Pro B Natriuretic peptide (BNP): 101 pg/mL — ABNORMAL HIGH (ref 0.0–100.0)

## 2010-12-09 LAB — POCT CARDIAC MARKERS
CKMB, poc: <1 ng/mL — ABNORMAL LOW (ref 1.0–8.0)
Myoglobin, poc: 64.4 ng/mL (ref 12–200)
Troponin i, poc: <0.05 ng/mL (ref 0.00–0.09)

## 2010-12-09 LAB — D-DIMER, QUANTITATIVE: D-Dimer, Quant: 0.86 ug/mL-FEU — ABNORMAL HIGH (ref 0.00–0.48)

## 2010-12-09 MED ORDER — IOHEXOL 350 MG/ML SOLN
100.0000 mL | Freq: Once | INTRAVENOUS | Status: AC | PRN
  Administered 2010-12-09: 100 mL via INTRAVENOUS

## 2010-12-10 LAB — LIPID PANEL
Cholesterol: 69 mg/dL (ref 0–200)
HDL: 28 mg/dL — ABNORMAL LOW (ref >39)
LDL Cholesterol: 33 mg/dL (ref 0–99)
Total CHOL/HDL Ratio: 2.5 RATIO
Triglycerides: 38 mg/dL (ref <150)
VLDL: 8 mg/dL (ref 0–40)

## 2010-12-10 LAB — DIFFERENTIAL
Basophils Absolute: 0 10*3/uL (ref 0.0–0.1)
Basophils Relative: 0 % (ref 0–1)
Eosinophils Absolute: 0 10*3/uL (ref 0.0–0.7)
Eosinophils Relative: 0 % (ref 0–5)
Lymphocytes Relative: 19 % (ref 12–46)
Lymphs Abs: 1.7 10*3/uL (ref 0.7–4.0)
Monocytes Absolute: 1 10*3/uL (ref 0.1–1.0)
Monocytes Relative: 11 % (ref 3–12)
Neutro Abs: 6.6 10*3/uL (ref 1.7–7.7)
Neutrophils Relative %: 71 % (ref 43–77)

## 2010-12-10 LAB — CBC
HCT: 34.4 % — ABNORMAL LOW (ref 36.0–46.0)
Hemoglobin: 10.7 g/dL — ABNORMAL LOW (ref 12.0–15.0)
MCH: 25.1 pg — ABNORMAL LOW (ref 26.0–34.0)
MCHC: 31.1 g/dL (ref 30.0–36.0)
MCV: 80.8 fL (ref 78.0–100.0)
Platelets: 249 10*3/uL (ref 150–400)
RBC: 4.26 MIL/uL (ref 3.87–5.11)
RDW: 14.9 % (ref 11.5–15.5)
WBC: 9.3 10*3/uL (ref 4.0–10.5)

## 2010-12-10 LAB — TSH: TSH: <0.008 u[IU]/mL — ABNORMAL LOW (ref 0.350–4.500)

## 2010-12-12 LAB — BASIC METABOLIC PANEL
BUN: 10 mg/dL (ref 6–23)
CO2: 26 mEq/L (ref 19–32)
Calcium: 8.6 mg/dL (ref 8.4–10.5)
Chloride: 110 mEq/L (ref 96–112)
Creatinine, Ser: 0.68 mg/dL (ref 0.4–1.2)
GFR calc Af Amer: >60 mL/min (ref >60)
GFR calc non Af Amer: >60 mL/min (ref >60)
Glucose, Bld: 96 mg/dL (ref 70–99)
Potassium: 4.3 mEq/L (ref 3.5–5.1)
Sodium: 143 mEq/L (ref 135–145)

## 2010-12-12 LAB — DIFFERENTIAL
Basophils Absolute: 0 10*3/uL (ref 0.0–0.1)
Basophils Relative: 0 % (ref 0–1)
Eosinophils Absolute: 0.1 10*3/uL (ref 0.0–0.7)
Eosinophils Relative: 1 % (ref 0–5)
Lymphocytes Relative: 37 % (ref 12–46)
Lymphs Abs: 3.4 10*3/uL (ref 0.7–4.0)
Monocytes Absolute: 1 10*3/uL (ref 0.1–1.0)
Monocytes Relative: 11 % (ref 3–12)
Neutro Abs: 4.6 10*3/uL (ref 1.7–7.7)
Neutrophils Relative %: 51 % (ref 43–77)

## 2010-12-12 LAB — CBC
HCT: 37.1 % (ref 36.0–46.0)
Hemoglobin: 11.5 g/dL — ABNORMAL LOW (ref 12.0–15.0)
MCH: 25.1 pg — ABNORMAL LOW (ref 26.0–34.0)
MCHC: 31 g/dL (ref 30.0–36.0)
MCV: 81 fL (ref 78.0–100.0)
Platelets: 282 10*3/uL (ref 150–400)
RBC: 4.58 MIL/uL (ref 3.87–5.11)
RDW: 14.9 % (ref 11.5–15.5)
WBC: 9.1 10*3/uL (ref 4.0–10.5)

## 2010-12-12 NOTE — H&P (Signed)
NAME:  Petrakis, Davene                 ACCOUNT NO.:  [id number]  MEDICAL RECORD NO.:  [id number]           PATIENT TYPE:  I  LOCATION:  A318                          FACILITY:  [facility]  PHYSICIAN:  Arne Cleveland, MD       DATE OF BIRTH:  10/05/1950  DATE OF ADMISSION:  12/09/2010 DATE OF DISCHARGE:  LH                             HISTORY & PHYSICAL   PRIMARY CARE PHYSICIAN:  Madelin Rear. Sherwood Gambler, MD  CHIEF COMPLAINT:  Shortness of breath.  HISTORY OF PRESENT ILLNESS:  Ms. Holaway is a 60 year old black female who states that she started with allergy and sinus symptoms last week for which she tried over-the-counter medicines.  Her symptoms got worse and this week she also developed chest congestion, night sweats, weakness, and shortness of breath with exertion.  She went and saw her doctor and was put on Augmentin, but her medicine did not help.  Her symptoms got worse.  Today, she called her doctor and she was told to come to the ER.  The patient states that she had chest congestion, pleurisy type pain, productive cough or yellow sputum, dyspnea on exertion, shortness of breath, and night sweats.  She denied any nausea, vomiting, or diarrhea.  She denied any rash.  She denies anybody else with similar illness.  She states that she has had pneumonia vaccine last year and a flu vaccine this year.  PAST MEDICAL HISTORY:  Significant for: 1. Hypertension. 2. Depression. 3. Hyperlipidemia. 4. Obesity. 5. Chronic back pain. 6. Gastroesophageal reflux disease. 7. Hypothyroidism.  MEDICATIONS:  She takes: 1. Nabumetone 500 mg b.i.d. 2. Depakote 500 mg at bedtime. 3. Potassium chloride 20 mEq daily. 4. Vitamin D3 of 50,000 units weekly. 5. Ditropan 5 mg twice a day for overactive bladder. 6. Amitriptyline 100 mg at bedtime. 7. Crestor 40 mg daily. 8. Augmentin, which she was just started on March 28, 875 mg twice a     day. 9. Levothyroxine 150 mEq daily. 10.Claritin 10 mg a  day. 11.Clonidine 0.2 mg at bedtime.  She has allergies to: 1. NEOSPORIN. 2. SULFA. 3. PENICILLIN.  PAST SURGICAL HISTORY:  She has had back surgery, hysterectomy, cholecystectomy, and left foot surgery x2.  SOCIAL HISTORY:  She is separated, disabled, lives with her daughter. Does not smoke or use illicit drugs.  She drinks a couple of beers to 12 pack on the weekend depending that she is going out or staying home. She does not drink during the week.  FAMILY HISTORY:  Father died of alcoholism and mother has medical problems and also alcohol abuse.  LABORATORY DATA:  Her sodium is 139, potassium 3.8, chloride 103, CO2 of 28, glucose is 101, BUN 9, and creatinine 0.69.  Cardiac markers:  CK-MB is 1, troponin is less than 0.5, and myoglobin is 64.  BNP is 101.0, normal range of 0-200.  CBC was not done, that is pending.  Chest x-ray showed mild bibasilar linear atelectasis.  CTA was done because of an elevated D-dimer and that showed no evidence of acute pulmonary embolus, bilateral lower lobe atelectasis with possible atelectasis orinfiltrate.  EKG was not ordered in the ER, but I will be ordering one.  REVIEW OF SYSTEMS:  As per history of present illness.  All systems were reviewed.  PHYSICAL EXAMINATION:  GENERAL:  A well-developed, well-nourished, obese black female in mild respiratory distress especially when she gets up and ambulates. VITAL SIGNS:  Her blood pressure is 144/70, pulse 75, respirations 28, temperature 98.5, and pulse oximetry 92% on 2 liters. HEENT:  Head is atraumatic and normocephalic.  Eyes:  Pupils equal, round, and reactive to light.  Discs sharp.  Extraocular muscle range of motion is full.  Sclerae are anicteric.  Ear, nose, and throat:  Mucous membranes appear dry.  Her pharynx is slightly erythematous.  There is no exudate seen. CHEST:  She has diffuse wheezing and decreased breath sounds at the bases. HEART:  Regular rhythm, rate approximately  80.  Normal S1 and S2 without murmur, gallop, or rub. ABDOMEN:  Obese, soft, and nontender with normoactive bowel sounds.  No hepatomegaly.  No splenomegaly.  No palpable mass. PELVIC AND RECTAL:  Deferred at the patient's request. EXTREMITIES:  There is no clubbing, cyanosis, or edema. SKIN:  No unusual rash or lesions noted. NEUROLOGIC:  The patient is awake, alert, and oriented x3.  Cranial nerves II through XII intact.  Motor strength 5/5 in all extremities. Sensory intact to fine touch.  IMPRESSION: 1. Pneumonia. 2. Hypoxia. 3. Multiple medical problems, currently well compensated.  PLAN:  Admit to Team II at [facility], begin IV antibiotics with Avelox, respiratory neb treatments every 4 hours, albuterol every 4 hours and every 2 hours as needed, IV hydration with D5 half-normal at 75 mL an hour, Mucinex 1200 p.o. b.i.d.  The patient will resume her home meds.  Her diet will be on heart-healthy diet.  I will obtain lipid profile in the morning.  We will obtain a TSH level and a complete blood count and an EKG was not done and I will begin the patient on IV antibiotics with Avelox 400 mg as she has allergies to penicillin.    Arne Cleveland, MD     ML/MEDQ  D:  12/09/2010  T:  12/10/2010  Job:  [id number]  Electronically Signed by Arne Cleveland  on 12/12/2010 11:14:27 AM

## 2010-12-14 NOTE — Discharge Summary (Signed)
  NAME:  [name], Diane                 ACCOUNT NO.:  [id number]  MEDICAL RECORD NO.:  [id number]           PATIENT TYPE:  I  LOCATION:  A318                          FACILITY:  [facility]  PHYSICIAN:  Wilson Singer, M.D.DATE OF BIRTH:  Apr 17, 1951  DATE OF ADMISSION:  12/09/2010 DATE OF DISCHARGE:  04/02/2012LH                              DISCHARGE SUMMARY   FINAL DISCHARGE DIAGNOSIS: 1. Sinusitis and allergic sinusitis. 2. Seasonal allergies. 3. Hypertension. 4. Depression. 5. Hyperlipidemia. 6. Gastroesophageal reflux disease. 7. Hypothyroidism.  CONDITION ON DISCHARGE:  Stable.  MEDICATIONS ON DISCHARGE:  Avelox 400 mg daily for 7 days course.  Continue home medications which include: 1. Naproxen 220 mg every 6 hours p.r.n. 2. Amitriptyline 100 mg at bedtime. 3. BC Powders as needed every 4 hours. 4. Clonidine 0.2 mg daily. 5. Crestor 40 mg daily. 6. Depakote 500 mg daily. 7. Ditropan 5 mg b.i.d. 8. Levothyroxine 150 mcg daily. 9. Loratadine 10 mg daily. 10.I have suggested the patient get Zyrtec over-the-counter at night. 11.Nabumetone 5 mg b.i.d. 12.Hydrocodone/APAP 7.5/325 one tablet every 6 hours p.r.n. 13.Potassium chloride 20 mEq daily. 14.Prednisone 10 mg daily to complete the reducing course. 15.Discontinue Augmentin.  HISTORY:  This very pleasant 60 year old lady was admitted with symptoms of dyspnea, dry cough, sinus congestion.  Please see initial history and physical examination done by Dr. Arne Cleveland.  HOSPITAL PROGRESS:  The patient was admitted, started on intravenous antibiotics and seemed to do well.  She had a CT angiogram of the chest in view of elevated D-dimer and this was negative for pulmonary embolism.  It did show bilateral lower lobe atelectasis but no evidence of pneumonia.  During her hospitalization, she remained well and continued to improve with intravenous antibiotics.  Today, she is doing extremely well and feels like she is back to  her baseline.  On physical examination, temperature 98.2, blood pressure 160/90, pulse 72, saturation 93% on room air.  Heart sounds are present and normal. Lung fields are entirely clear.  She is alert and oriented without any focal neurologic signs.  Lab work today shows sodium 143, potassium 4.3, bicarbonate 26, BUN 10, creatinine 0.68.  Hemoglobin 11.5, white blood cell count 9.1, platelets 282,000.  TSH is less than 0.008.  DISPOSITION:  The patient is clearly stable to be discharged home.  I think she does need followup on her TSH and I would suggest that her thyroxin dose be reduced at this point and reevaluate.  I have asked the patient to follow up with her primary care physician in 2 weeks' time to follow up with these issues and to make sure that her sinusitis has improved.     Wilson Singer, M.D.     NCG/MEDQ  D:  12/12/2010  T:  12/13/2010  Job:  [id number]  cc:   Madelin Rear. Sherwood Gambler, MD Fax: [phone]  Electronically Signed by Lilly Cove M.D. on 12/14/2010 12:17:19 PM

## 2010-12-15 ENCOUNTER — Ambulatory Visit (INDEPENDENT_AMBULATORY_CARE_PROVIDER_SITE_OTHER): Payer: Medicare HMO | Admitting: Psychology

## 2010-12-15 DIAGNOSIS — F39 Unspecified mood [affective] disorder: Secondary | ICD-10-CM

## 2010-12-22 ENCOUNTER — Encounter (INDEPENDENT_AMBULATORY_CARE_PROVIDER_SITE_OTHER): Payer: Medicare HMO | Admitting: Psychiatry

## 2010-12-22 DIAGNOSIS — F3189 Other bipolar disorder: Secondary | ICD-10-CM

## 2010-12-29 ENCOUNTER — Encounter (INDEPENDENT_AMBULATORY_CARE_PROVIDER_SITE_OTHER): Payer: Medicare HMO | Admitting: Psychology

## 2010-12-29 DIAGNOSIS — F39 Unspecified mood [affective] disorder: Secondary | ICD-10-CM

## 2011-01-19 ENCOUNTER — Encounter (INDEPENDENT_AMBULATORY_CARE_PROVIDER_SITE_OTHER): Payer: Medicare HMO | Admitting: Psychiatry

## 2011-01-19 DIAGNOSIS — F39 Unspecified mood [affective] disorder: Secondary | ICD-10-CM

## 2011-01-24 NOTE — Consult Note (Signed)
NAME:  Diane [name], Diane [name]                 ACCOUNT NO.:  [id number]   MEDICAL RECORD NO.:  [id number]          PATIENT TYPE:  AMB   LOCATION:  DAY                           FACILITY:  [facility]   PHYSICIAN:  R. Roetta Sessions, M.D. DATE OF BIRTH:  Jan 15, 1951   DATE OF CONSULTATION:  09/29/2008  DATE OF DISCHARGE:                                 CONSULTATION   REASON FOR CONSULTATION:  Colonoscopy.   PHYSICIAN REQUESTING CONSULTATION:  Milus Mallick. Lodema Hong, MD   HISTORY OF PRESENT ILLNESS:  The patient is a 60 year old African  American female who presents today to schedule colonoscopy.  She has  been having more problems with constipation.  She has had intermittent  constipation off and on and used to do very well on MiraLax, but she  came off the medication when she can no longer receive it via  prescription.  She has been using a lot of over-the-counter stimulant  laxatives.  She states she cannot have a bowel movement unless she takes  a laxative.  She has intermittent bright red blood per rectum.  She  complains of intermittent rectal pain and abdominal cramping associated  with bowel movement.  She denies any melena.  No weight loss, anorexia,  nausea, or vomiting.  She has chronic GERD.  She is not sure if she is  on anything for it, but according to Dr. Anthony Sar last office note, she  prescribed her omeprazole 20 mg daily.  The patient states she has  frequent heartburn.  She uses vinegar approximately once a week for  symptom control.  She complains of dysphagia to some solids, but mostly  liquids.   CURRENT MEDICATIONS:  1. Niaspan 500 mg 4 tablets at bedtime.  2. Klor-Con 20 mEq daily.  3. Crestor 40 mg daily.  4. Zyrtec 10 mg daily.  5. Fluticasone inhaler 1 puff each nostril twice a day.  6. Os-Cal with vitamin D t.i.d.  7. Percocet 5/325 mg 1-2 tablets every 4 hours as needed.  8. Valium 5 mg t.i.d.  9. Levothroid 150 mcg Monday to Friday and 1/2 tablet on Saturday and  Sunday.  10.Neurontin 300 mg daily.  11.Flexeril 10 mg at bedtime.  12.Prozac 10 mg daily?  13.Omeprazole 20 mg daily.  14.She recently completed ibuprofen 800 mg t.i.d. prescription and is      currently taking about 800 mg over the counter daily.  15.She takes [facility] powders about 4 daily.  Recently completed prednisone      Dosepak.  16.Catapres 0.2 mg at bedtime.   ALLERGIES:  SULFA and NEOSPORIN.   PAST MEDICAL HISTORY:  Seasonal allergies; sinusitis; chronic back pain,  has a referral to neurologist, but has not have the appointment yet;  depression; chronic GERD; hypertension; and hypothyroidism.  Colonoscopy  and EGD in October 2007, she had a marginal prep.  She had internal  hemorrhoids, pancolonic diverticula, right colon polyps were  adenomatous, and rectal polyps, hyperplastic.  EGD revealed small hiatal  hernia and she had empirical 56-French Maloney dilator.  She has had a  prior cholecystectomy, hysterectomy, back surgery,  left foot surgery x2,  and hyperlipidemia.   FAMILY HISTORY:  Father deceased in his 13s due to alcohol abuse.  Previously, she told she had had MI as well.  No family history of  problem with GI illnesses or liver disease.   SOCIAL HISTORY:  She is married, has 2 children.  She is disabled.  She  quit smoking over 10 years ago.  She consumes beer a couple of times  during the week and maybe packs on some weekends at the most.   REVIEW OF SYSTEMS:  See HPI for GI.  CONSTITUTIONAL:  No weight loss.  CARDIOPULMONARY:  No chest pain, shortness of breath, palpitations, or  cough.  GENITOURINARY:  No dysuria or hematuria.   PHYSICAL EXAMINATION:  VITAL SIGNS:  Weight 219, height 5 feet 3 inches,  temp 98, blood pressure 126/90, and pulse 64.  GENERAL:  A pleasant, obese black female in no acute distress.  SKIN:  Warm and dry.  No jaundice.  HEENT:  Sclerae nonicteric.  Oropharyngeal mucosa moist and pink.  No  lesions, erythema, or exudate.  No  lymphadenopathy or thyromegaly.  CHEST:  Lungs are clear to auscultation.  CARDIAC:  Regular rate and rhythm.  ABDOMEN:  Positive bowel sounds.  Abdomen is soft and obese.  She has  mild epigastric tenderness to deep palpation.  No rebound or guarding.  No organomegaly or masses.  No abdominal bruits or hernias.  LOWER EXTREMITIES:  No edema.   LABORATORY DATA:  Calcium 9.2, sodium 141, potassium 4.1, BUN 14,  creatinine 0.87, total bilirubin 0.4, alkaline phosphatase 61, AST 18,  ALT 16, albumin 3.9, TSH was 7.199.   IMPRESSION:  Diane [name] is a 60 year old lady who presents for  consideration of colonoscopy.  She has had worsening of her constipation  as well as intermittent hematochezia.  Previous colonoscopy revealed  somewhat marginal prep and she did have some adenomatous polyps,  hemorrhoids, and pancolonic diverticulosis.  Given her change in bowels,  we will go ahead and pursue colonoscopy at this time.  In addition, she  does complain of chronic gastroesophageal reflux disease, it is not  clear whether she is currently on a PPI.  She complains of dysphagia to  solids and liquids.  She has epigastric tenderness and is on multiple  NSAIDs putting her at increased risk for peptic ulcer disease.  I have  offered her an upper endoscopy today to further evaluate her upper GI  symptoms and she is agreeable.   PLAN:  1. Colonoscopy and EGD in the near future with Dr. Jena Gauss.  I have      discussed risks, alternatives, and benefits with regards to, but      not limited to the risk of reaction to medication, bleeding,      infection, perforation, and she is agreeable to proceed.  2. MiraLax 17 g daily over the counter.  3. She will double check if she is on omeprazole.  If she is, she will      increase her dose to 20 mg b.i.d., 30 minutes      before meals and new prescription for, #60 with 1 refill given.  If      she is not taking it, she would begin omeprazole 20 mg daily.  4. I  would like to thank Dr. Syliva Overman for allowing [place] to take      part in the care of this patient.      Tana Coast, P.A.  Jonathon Bellows, M.D.  Electronically Signed    LL/MEDQ  D:  09/29/2008  T:  09/30/2008  Job:  [id number]   cc:   Milus Mallick. Lodema Hong, M.D.  Fax: [phone]

## 2011-01-24 NOTE — Discharge Summary (Signed)
NAME:  Diane [name], Diane [name]                 ACCOUNT NO.:  [id number]   MEDICAL RECORD NO.:  [id number]          PATIENT TYPE:  IPS   LOCATION:  0600                          FACILITY:  [facility]   PHYSICIAN:  Jasmine Pang, M.D. DATE OF BIRTH:  1951-01-15   DATE OF ADMISSION:  03/12/2007  DATE OF DISCHARGE:  03/15/2007                               DISCHARGE SUMMARY   IDENTIFICATION:  A 60 year old African American female who was admitted  on an involuntary basis on March 12, 2007.   HISTORY OF PRESENT ILLNESS:  The patient reports being severely  depressed following a family argument.  She tried to kill herself by  taking an unknown quantity of unknown medications.  She used to see Dr.  Omelia Blackwater in [place].  She has had no medications in the past 2 weeks.  She was brought to the ED by the police for her intentional overdose.  She stated she just wanted to go to sleep.  Her family situation has  been very difficult and she has been upset about this.  She has several  people living in the house that are upsetting the relationship between  she and her husband.  This is the first [facility] admission for this patient.  She is disabled.  She has hypothyroidism, hyperlipidemia and back pain.  She is currently on levothyroxine 150 mcg daily, Zegerid 40 mg daily,  Aciphex 40 mg daily, aspirin 325 mg daily, KCl 20 mg daily and Cymbalta  120 mg daily.  She is also on tramadol.  She is on Keppra 500 mg p.o.  b.i.d. and clonidine 0.5 mg p.o. q.h.s.  She is allergic to sulfa drugs  and Neosporin.   PHYSICAL FINDINGS:  There were no acute physical or medical problems  noted.  The patient's physical exam was done in the ED at [facility].  The admission laboratories blood alcohol level was 127.  UDS  was positive for cannabis and benzodiazepines.  Urinalysis revealed 10  to 20 white blood cells per high power field.  Salicylate level was less  than 4.  Acetaminophen less than 10.   HOSPITAL COURSE:  Upon  admission, the patient was started on  levothyroxine 150 mcg p.o. daily, Aciphex 20 mg p.o. daily, Crestor 40  mg p.o. daily, aspirin 325 mg p.o. daily, K-Dur 20 mEq daily, Cymbalta  120 mg daily, Keppra 500 mg p.o. b.i.d., clonidine 0.1 p.o. q.h.s.  She  was also started on Ambien CR 12.5 mg p.o. q.h.s. p.r.n. insomnia.  She  was ordered Librium 25 mg p.o. q.6 h. p.r.n. symptoms of anxiety or  withdrawal since she had positive benzodiazepines in her urine drug  screen and had an elevated blood alcohol level upon admission.  The  urinalysis was repeated; however, the results have not returned yet.  She is advised to go to her primary care physician if she feels she has  a urinary tract infection.  The patient tolerated her medications well  with no significant side effects.  The patient was friendly and  cooperative with unit therapeutic groups  and activities.  She talked  about taking an overdose of pills due to family problems.  Her family  called the police and she was taken to [facility].  She states  she just reunited with her husband from whom she was separated for the  past 2 years.  Dr. Syliva Overman, her primary care physician, was  trying to arrange treatment with a therapist.  On March 14, 2007, the  patient stated, I feel pretty good today.  She had talked with her  husband last night.  It went well.  She talked with the woman living in  her house and established that she this woman and her daughter will  follow the rules set down by the patient and her husband.  She was  sleeping well.  Appetite was improving.  Mood was less depressed and  anxious.  Affect was wide range.   On March 15, 2007, mental status continued to be improved.  There was a  family session over the phone between the patient and the patient's  husband.  She reported that she was feeling better and much more  confident.  She says family stress was the main problem, husband agrees  and says that the  patient's children and grandchildren take advantage of  her.  She has a hard time standing up to her children because she feels  bad about how she treated them in the past according to her husband.  The patient and husband agreed she needs to focus on taking care of  herself.  She agreed she wanted to follow up with counseling.  Her mood  was euthymic.  Affect wide range.  No suicidal or homicidal ideation.  No self injurious behavior.  No auditory or visual hallucinations.  No  paranoia or delusions.  Thoughts were logical and goal-directed.  Thought content no predominant theme.  Cognitively, was grossly back to  baseline.  It was felt the patient was safe to be discharged today to  her husband.   DISCHARGE DIAGNOSES:  AXIS I:  1.  Depressive disorder, not otherwise  specified.  1. Cocaine abuse.  2. Rule out alcohol abuse, rule out benzodiazepine abuse.  AXIS II:  None.  AXIS III:  Back pain, hypothyroidism.  AXIS IV:  Severe.  (Problems with primary support group, housing  problems, other psychosocial problems, burden of psychiatric illness.  Burden of medical illness.)  AXIS V:  GAF on discharge is 50.  GAF upon admission was 30.  GAF  highest past year was 60 to 65.   DISCHARGE PLANS:  There were no specific activity level or dietary  restrictions.  The patient will follow up with therapy.  This will be  arranged by our case manager on Monday March 18, 2007, since offices are  closed today for the holiday.  She will see Dr. Lodema Hong in 2 weeks for  primary care problems.  She was told to let Dr. Lodema Hong know if she felt  she was having symptoms of urinary tract infection.   DISCHARGE MEDICATIONS:  1. Levothyroxine 100 mcg daily.  2. Cymbalta 60 mg two pills daily.  3. Keppra 500 mg twice daily.  4. Clonidine 0.1 mg at bedtime.  5. Aspirin 325 mg daily.  6. Protonix 40 mg daily.  7. K-Dur 20 mEq daily.  8. Crestor 40  mg daily.  9. Ambien 12.5 mg p.o. q.h.s.       Jasmine Pang, M.D.  Electronically Signed     BHS/MEDQ  D:  03/15/2007  T:  03/15/2007  Job:  [id number]

## 2011-01-24 NOTE — Op Note (Signed)
NAME:  Diane [name], Diane [name]                 ACCOUNT NO.:  [id number]   MEDICAL RECORD NO.:  [id number]          PATIENT TYPE:  AMB   LOCATION:  DAY                           FACILITY:  [facility]   PHYSICIAN:  R. Roetta Sessions, M.D. DATE OF BIRTH:  Jul 09, 1951   DATE OF PROCEDURE:  10/16/2008  DATE OF DISCHARGE:  10/16/2008                               OPERATIVE REPORT   PROCEDURES:  Esophagogastroduodenoscopy and colonoscopy.   INDICATIONS FOR PROCEDURE:  A 60 year old lady with intermittent  hematochezia and worsening constipation.  Prior colonoscopy in the  setting of a poor prep demonstrated colonic adenomas, hemorrhoids,  pancolonic diverticulosis.  She has chronic gastroesophageal reflux  disease symptoms as well and some dysphagia to both solids and liquids.  EGD and colonoscopy are now being done.  Potential for esophageal  dilation reviewed.  Risks, benefits, alternatives and limitations have  been discussed, all parties agreeable.  Please see the documentation in  the medical record.  Since being started on MiraLax in the office, her  symptoms of constipation have improved considerably.  Please see the  documentation in the medical record.   PROCEDURE NOTE:  O2 saturation, blood pressure, pulse and respiration  were monitored throughout entire procedure.  Conscious sedation:  Versed  6 mg IV, Demerol 75 mg IV in divided doses.  Instrument:  Pentax video  chip system.  Cetacaine spray for topical pharyngeal anesthesia.   FINDINGS:  EGD:  Exam of the tubular esophagus revealed a normal-  appearing mucosa.  EG junction easily traversed.  Stomach:  Gastric  cavity was emptied and insufflated well with air.  A thorough  examination of the gastric mucosa including retroflexion in the proximal  stomach and esophagogastric junction demonstrated only a small hiatal  hernia.  Pylorus was patent, easily traversed.  Examination of the bulb  and second portion revealed no abnormalities.   Therapeutic/diagnostic maneuvers performed:  The scope was withdrawn.  A  56-French Maloney dilator was passed easily to full insertion without  blood return on the dilator.  A look back revealed no apparent  complication related to passage of the dilator.  The patient tolerated  the procedure well and was prepared for colonoscopy.   Digital rectal exam revealed no abnormalities.  Endoscopic findings:  The prep was adequate.  Colon:  Colonic mucosa was surveyed from the  rectosigmoid junction through the left, transverse, right colon to the  area of the appendiceal orifice, ileocecal valve/cecum.  These  structures were well-seen and photographed for the record.  From this  level the scope was slowly and cautiously withdrawn.  All previously-  mentioned mucosal surfaces were again seen.  The patient was noted have  pancolonic diverticula.  However, the remainder of the colonic mucosa  appeared normal.  The scope was pulled down into the rectum, where a  thorough examination of the mucosa including a retroflexed view of the  anal verge demonstrated only internal hemorrhoids.  The patient  tolerated the procedure well and was reacted in endoscopy.   IMPRESSION:  Small hiatal hernia otherwise normal esophagus, stomach,  D1, D2,  status post passage of a 56-French Maloney dilator.  Colonoscopy  findings:  Hemorrhoids, otherwise normal rectum; pancolonic diverticula;  the remainder of the colonic mucosa appeared normal.   RECOMMENDATIONS:  1. Repeat colonoscopy in 5 years given history of polyps.  2. Anusol-HC suppositories 1 per rectum at bedtime x10 days.  3. Daily Benefiber or Citrucel fiber supplement.  4. Continue omeprazole 20 mg orally daily for gastroesophageal reflux      disease.      Jonathon Bellows, M.D.  Electronically Signed     RMR/MEDQ  D:  10/26/2008  T:  10/26/2008  Job:  [id number]   cc:   Milus Mallick. Lodema Hong, M.D.  Fax: [phone]

## 2011-01-27 ENCOUNTER — Encounter (INDEPENDENT_AMBULATORY_CARE_PROVIDER_SITE_OTHER): Payer: Medicare HMO | Admitting: Psychology

## 2011-01-27 DIAGNOSIS — F39 Unspecified mood [affective] disorder: Secondary | ICD-10-CM

## 2011-01-27 NOTE — Op Note (Signed)
NAME:  [name], Diane [name]                 ACCOUNT NO.:  [id number]   MEDICAL RECORD NO.:  [id number]          PATIENT TYPE:  INP   LOCATION:  2899                         FACILITY:  [facility]   PHYSICIAN:  Hilda Lias, M.D.   DATE OF BIRTH:  [birth year]   DATE OF PROCEDURE:  09/13/2005  DATE OF DISCHARGE:                                 OPERATIVE REPORT   PREOPERATIVE DIAGNOSIS:  Left L4-5 herniated disk, foraminal.   POSTOPERATIVE DIAGNOSIS:  Left L4-5 herniated disk, foraminal.   PROCEDURE:  Left L4-5 diskectomy, foraminotomy, decompression of the L4, and  L5 nerve root, microscope.   SURGEON:  Hilda Lias, M.D.   ASSISTANT:  Clydene Fake, M.D.   CLINICAL HISTORY:  The patient is morbidly obese, who had been complaining  of back pain with radiation down to the left leg.  She has failed with every  single conservative treatment.  X-ray shows a herniated disk, foraminal, at  the level of L4-5 on the left side.  Surgery was advised, and the risks were  explained in the history and physical.   PROCEDURE:  The patient was taken to the OR, and she was positioned in a  prone manner.  The back was prepped with Betadine.  It was difficult to  palpate the spinous process, so we went ahead and we put a needle and an x-  ray, which showed that we were pointing toward the level of L5-S1.  From  then on we went above and we opened the skin and subcutaneous tissue down to  the lumbar spine.  We identified L5-S1 and then L4-5.  For distal dissection  we used the microscope and we drilled the lower lamina of L4 and the upper  of L5.  A thick yellow ligament was also excised.  From then on the dura  mater was retracted.  Indeed, there was a herniated disk also going  laterally into the foramen.  Retraction of the dural sac was achieved, and  we went into the disk space.  Total diskectomy with removal of a large  amount of degenerative disk was accomplished.  At the end we have a good  decompression of the L4 and L5 nerve root.  Having good decompression, the  area was irrigated and Valsalva maneuver was negative.  From then on  fentanyl and Depo-Medrol were left in the epidural space and the wound was  closed with Vicryl and a Steri-Strip.           ______________________________  Hilda Lias, M.D.     EB/MEDQ  D:  09/13/2005  T:  09/13/2005  Job:  [id number]

## 2011-01-27 NOTE — Discharge Summary (Signed)
NAME:  [name], Diane                 ACCOUNT NO.:  [id number]   MEDICAL RECORD NO.:  [id number]          PATIENT TYPE:  INP   LOCATION:  3005                         FACILITY:  [facility]   PHYSICIAN:  Hewitt Shorts, M.D.DATE OF BIRTH:  [birth year]   DATE OF ADMISSION:  09/13/2005  DATE OF DISCHARGE:  09/17/2005                                 DISCHARGE SUMMARY   The patient is a 60 year old black female, a patient of Dr. Jeral Fruit, whom he  admitted for treatment of a lumbar disk herniation.  Details of his  admission history and physical examination are included in his admission  note.   HOSPITAL COURSE:  The patient is admitted is admitted by Dr. Jeral Fruit.  She  underwent a left L4-5 lumbar diskectomy.  Postoperatively, she has made  gradual progress.  She was seen in consultation by physical therapy and  occupational therapy and has gradually been able to increase her ambulation  and activities in the halls.  Her wound is healing well.  She is afebrile.  She is up and ambulating with the assistance of a rolling walker.  She has a  rolling walker at home.  We have arranged for a three-in-one to be available  for her at home.  She has been given a prescription for Percocet 1-2 tablets  p.o. q.4-6h. p.r.n. for pain, 40 tablets prescribed with no refills.  She is  to return for followup with Dr. Allena Katz in two to three weeks.  She is to call  his office tomorrow to make that appointment.   DISCHARGE DIAGNOSIS:  Lumbar disk herniation.      Hewitt Shorts, M.D.  Electronically Signed     RWN/MEDQ  D:  09/17/2005  T:  09/18/2005  Job:  [id number]

## 2011-01-27 NOTE — Procedures (Signed)
NAME:  Diane [name], Diane [name]                 ACCOUNT NO.:  [id number]   MEDICAL RECORD NO.:  [id number]          PATIENT TYPE:  OBV   LOCATION:  A227                          FACILITY:  [facility]   PHYSICIAN:  Gerrit Friends. Dietrich Pates, MD, FACCDATE OF BIRTH:  [birth year]   DATE OF PROCEDURE:  07/10/2006  DATE OF DISCHARGE:                                  ECHOCARDIOGRAM   REFERRING PHYSICIAN:  Dr. Dietrich Pates.   CLINICAL DATA:  A 60 year old woman with CVA.  M-mode aorta 3.2, left atrium  3.1, septum 1.5, posterior wall 1.3, LV diastole 4.5, LV systole 3.5.   1. Technically somewhat suboptimal, but adequate echocardiographic study.  2. Normal left atrium, right atrium, and right ventricle.  3. Normal aortic root size; mild calcification of the wall.  4. Normal and trileaflet aortic valve.  5. Tricuspid valve not well seen; mild regurgitation present with      estimated RV systolic pressure at the upper limit of normal.  6. Normal pulmonic valve and proximal pulmonary artery.  7. Normal mitral valve; mild annular calcification; trivial regurgitation.  8. Left ventricular size at the upper limit of normal; mild hypertrophy;      normal regional and global left ventricular systolic function.  9. Normal IVC.      Gerrit Friends. Dietrich Pates, MD, [facility]  Electronically Signed     RMR/MEDQ  D:  07/11/2006  T:  07/11/2006  Job:  [id number]

## 2011-01-27 NOTE — H&P (Signed)
NAME:  Diane [name], Diane [name]                           ACCOUNT NO.:  [id number]   MEDICAL RECORD NO.:  [id number]                   PATIENT TYPE:  INP   LOCATION:  A327                                 FACILITY:  [facility]   PHYSICIAN:  Jerolyn Shin C. Katrinka Blazing, [name].D.                DATE OF BIRTH:  1950/12/19   DATE OF ADMISSION:  07/16/2003  DATE OF DISCHARGE:                                HISTORY & PHYSICAL   BRIEF HISTORY:  [age]-year-old female with greater than a 7-day  history of crampy upper abdominal pain with nausea and vomiting.  The pain  radiated through to her back.  She was seen in the emergency room acutely  and she was noted to have multiple gallstones.  She has had worsening pain  over the last 2 days.  She has had emesis on a daily basis for the last 2  days.  Patient was admitted acutely for a suspected acute cholecystitis with  cholelithiasis.  Ultrasound reveals multiple gallstones.   PAST HISTORY:  She has hypertension, hyperlipidemia, and hypothyroidism with  goiter, reflex sympathetic dystrophy left foot.  This is treated at the pain  clinic in [place].  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  She also has a history of depression.   MEDICATIONS:  Medications are methadone, amitriptyline 75 mg three tabs at  bedtime, Synthroid 88 mcg once daily, Keppra 500 mg t.i.d., Flexeril 10 mg  t.i.d., Neurontin 800 mg q.i.d.  She also takes hydrocodone for breakthrough  pain.   ALLERGY:  SULFA.   SURGERIES:  Cutaneous neurectomy left foot and total abdominal hysterectomy.   FAMILY HISTORY:  Positive for stroke, atherosclerotic heart disease, and  hypertension.   PHYSICAL EXAMINATION:  GENERAL:  The patient is comfortable but she has had  pain medication.  HEENT:  Unremarkable except for missing upper teeth.  NECK:  Supple; slightly enlarged thyroid gland; no adenopathy; no  tenderness.  CHEST:  Clear to auscultation; no rales, rubs,  rhonchi, or wheezes.  HEART:  Regular rate and rhythm without murmur, gallop, or rub.  ABDOMEN:  Mild epigastric tenderness; normoactive bowel sounds; no masses;  healed hypogastric midline scar.  EXTREMITIES:  Normal except for hyperesthesia of the left foot.  There is  healed surgical scar behind the left medial malleolus.  NEUROLOGIC:  No focal motor, sensory, or cerebellar deficit.   IMPRESSION:  1. Cholelithiasis, cholecystitis.  2. Hypertension.  3. Depression.  4. Reflex sympathetic dystrophy left foot.  5. Hyperlipidemia.  6. Hypothyroidism.   PLAN:  The patient is admitted, she has been started on IV antibiotics,  baseline labs have been ordered and she will be scheduled for a laparoscopic  cholecystectomy on an urgent basis.     ___________________________________________  Dirk Dress. Katrinka Blazing, [name].D.   LCS/MEDQ  D:  07/17/2003  T:  07/17/2003  Job:  [id number]

## 2011-01-27 NOTE — Discharge Summary (Signed)
NAME:  Diane [name], Diane [name]                           ACCOUNT NO.:  [id number]   MEDICAL RECORD NO.:  [id number]                   PATIENT TYPE:  INP   LOCATION:  A327                                 FACILITY:  [facility]   PHYSICIAN:  Jerolyn Shin C. Katrinka Blazing, [name].D.                DATE OF BIRTH:  03/24/1951   DATE OF ADMISSION:  07/16/2003  DATE OF DISCHARGE:  07/20/2003                                 DISCHARGE SUMMARY   DISCHARGE DIAGNOSES:  1. Cholelithiasis with cholecystitis.  2. Hypertension.  3. Depression.  4. Hyperlipidemia.  5. Hypothyroidism.  6. Reflex sympathetic dystrophy.   SPECIAL PROCEDURES:  Laparoscopic cholecystectomy on July 17, 2003.   DISPOSITION:  The patient is discharged home in stable, satisfactory  condition.   DISCHARGE MEDICATIONS:  1. Lortab 10/500 1-2 q.4h. p.r.n. pain.  2. Amitriptyline 75 mg 3 tablets q.h.s.  3. Synthroid 80 mcg daily.  4. Keppra 500 mg t.i.d.  5. Flexeril 10 mg t.i.d.  6. __________ 800 mg q.i.d.  7. Methasone at her previous dose.   SUMMARY:  A 60 year old female with greater than a 7 day history of crampy  abdominal pain, nausea, vomiting.  She was seen here in the emergency room  and was worked up and was noted to have multiple gallstones.  She had  worsening pain over the 2 days prior to admission.  She was felt to have  acute cholecystitis with cholelithiasis.  The specifics are given in the  admission note.  On exam, she had mild epigastric tenderness.  She had  urgent cholecystectomy and was found to have acute cholecystitis with  multiple gallstones.  Her subhepatic space was drained because of acute  inflammation.  She did very well in the postop.  She was treated with IV  antibiotics.  She became afebrile.  JP drain amount decreased.  On November  8, which was postop day #3, she was in satisfactory condition and was  discharged home.   PLAN:  Follow up in the office in two weeks.      ___________________________________________                                         Dirk Dress Katrinka Blazing, [name].D.   LCS/MEDQ  D:  09/13/2003  T:  09/14/2003  Job:  [id number]

## 2011-01-27 NOTE — Consult Note (Signed)
NAME:  Guerrero, Tabbatha                 ACCOUNT NO.:  [id number]   MEDICAL RECORD NO.:  [id number]          PATIENT TYPE:  OBV   LOCATION:  A227                          FACILITY:  [facility]   PHYSICIAN:  Kofi A. Gerilyn Pilgrim, M.D. DATE OF BIRTH:  1951/07/23   DATE OF CONSULTATION:  DATE OF DISCHARGE:                                   CONSULTATION   NEUROLOGIC CONSULTATION   The patient is a 60 year old black female who has a known history of lumbar  degenerative disk disease.  The patient apparently underwent an L4-L5  diskectomy and foraminotomy, January of this year.  At that time, she had  significant pain.  The results of that she was not able to do a lot of  household activity.  She had difficulty ambulating, bending forward.  The  surgery essentially eliminated her symptoms.  Over the past 3 months, she  had recurrent symptoms of weakness and pain involving the left leg.  Additionally, she has had left shoulder pain.  She would have episodes where  her left side simply gave way, particularly her left leg.  This resulted in  the patient falling a few times.  The patient does not report loss of  consciousness, alteration or consciousness, or other focal neurological  symptoms.  There are no headaches associated with her symptoms.  These  spells are not precipitated by emotional stress or stimuli.   PAST MEDICAL HISTORY:  Significant for:  1. Obesity.  2. Depression.  3. Hypothyroidism.  4. Dyslipidemia.  5. Degenerative disk disease.   PAST SURGICAL HISTORY:  1. She has had a tarsal tunnel release procedure on the left that did not      relieve her symptoms in 2002.  2. She is status post L4-L5 diskectomy and foraminotomy.  3. She has had a myomectomy followed by a hysterectomy.  4. She has also had a colonoscopy showing a hyperplastic polyp and      internal hemorrhoids.   ADMISSION MEDICATION:  1. Ambien.  2. Amitriptyline.  3. Clonazepam.  4. Crestor.  5.  Hydrochlorothiazide.  6. Levothyroxine.  7. Phenergan.  8. Potassium.  9. Skelaxin.  10.Relafen.  11.Vicodin.  12.Celebrex.   ALLERGIES:  NEOSPORIN CAUSES A RASH AND SULFA MEDICATION CAUSES HIVES.   FAMILY HISTORY:  Significant for alcoholism.   SOCIAL HISTORY:  She lives with a friend in [place].  She quit smoking about 10  years ago.  No alcohol use.  No illicit drug use.  She is disabled because  of her back problems.  She usually lives independent and able to do her  ADLs.   REVIEW OF SYSTEMS:  As stated in the history of present illness, otherwise  unrevealing.   PHYSICAL EXAMINATION:  An obese, pleasant lady in no acute distress.  Temperature is 97.2.  Pulse 75.  Respiration 19.  Blood pressure 125/69.  HEENT:  A short, stocky neck.  Posterior air space is crowded.  She does  have a large tongue base.  Head is normocephalic, atraumatic.  NECK:  Supple.  ABDOMEN:  Obese but soft.  EXTREMITIES:  No significant edema or varicosities.  NEUROLOGIC:  Mentation:  The patient is awake, alert.  Speech, language, and  cognition are intact.  Cranial nerve evaluation - pupils are 6 mm dilated  but are reactive.  Visual fields are intact.  Extraocular movements are  intact.  Visual fields are full, thick.  Muscle strength is symmetric.  Tongue is midline.  Uvula is midline.  Shoulder shrugs are normal.  Motor  examination shows left upper extremity weakness, particularly proximally,  greatest 3/4, most limited to pain, especially over the deltoids.  This is  associated with a reduced range of motion.  The left leg is also weak,  especially dorsiflexion which is about a 3/5.  The remaining parts of the  left leg is 4+/5, again seems to be limited by pain.  Coordination shows no  evidence of dysmetria.  There are no tremors, Parkinsonism, or bradykinesia.  Reflexes are preserved and normal in both upper and lower extremities.  Sensory examination shows reduced sensation to temperature and  light touch  involving the lateral aspect of the left leg, essentially in the L5  distribution.   SUPPORTIVE DATA:  MRI of the brain is negative for any acute pathology.  MRI  of the lumbar spine shows a recurrent herniated disk in the left  posterolateral area which causes some compression of the L5 nerve root.  There is also a broad-based herniation at T11-T12 with some effacement of  the subarachnoid space but no cord compression.   IMPRESSION:  Left-sided weakness due to L5 radiculopathy on the leg and  shoulder pain likely rotator cuff pathology.   RECOMMENDATIONS:  I think we should give the patient a trial of pulse  steroids to see if she responds to this.  Potential treatment includes  epidural injection of the L5 nerve root on the left.  If this fails, she may  need to see a surgeon for possible repeat procedure.  Also suggest  orthopedic consultation for the left shoulder.   Thanks for this consultation.     Kofi A. Gerilyn Pilgrim, M.D.  Electronically Signed    KAD/MEDQ  D:  07/10/2006  T:  07/10/2006  Job:  [id number]

## 2011-01-27 NOTE — Procedures (Signed)
NAME:  Diane [name], Diane [name]                 ACCOUNT NO.:  [id number]   MEDICAL RECORD NO.:  [id number]          PATIENT TYPE:  OUT   LOCATION:  SLEE                          FACILITY:  [facility]   PHYSICIAN:  Kofi A. Gerilyn Pilgrim, M.D. DATE OF BIRTH:  Mar 28, 1951   DATE OF PROCEDURE:  DATE OF DISCHARGE:  11/12/2008                             SLEEP DISORDER REPORT   NOCTURNAL POLYSOMNOGRAPHY REPORT   REFERRING PHYSICIAN:  Kofi A. Gerilyn Pilgrim, M.D.   INDICATION:  This is a 60 year old lady who reports daytime fatigue and  snoring.  She has been evaluated for obstructive sleep apnea syndrome.   MEDICATIONS:  Levothyroxine, potassium, omeprazole, Relafen,  cyclobenzaprine, clonidine, tramadol, methocarbamol, amitriptyline,  Crestor, gabapentin.   Epworth sleepiness scale 4.  BMI 34.   ARCHITECTURAL SUMMARY:  The total recording time is 365 minutes.  The  sleep efficiency 92%.  Sleep latency 6.5 minutes.  REM latency 0.  Stage  N1 6%, N2 81%, N3 30%, and REM sleep 0.   RESPIRATORY SUMMARY:  The baseline oxygen saturation is 97%, the lowest  saturation is 83%.  The patient had a persistently low oxygen saturation  in the mid 80s and was subsequently given a liter of oxygen which bumped  the baseline up to the 90s.  Her AHI is 5.0.   LIMB MOVEMENT SUMMARY:  PLM index 10.   ELECTROCARDIOGRAM SUMMARY:  Average heart rate 63 with no significant  dysrhythmias observed.   IMPRESSION:  1. Hypoventilation syndrome.  2. Mild obstructive sleep apnea syndrome.   RECOMMENDATIONS:  Two liters nocturnal oxygen.      Kofi A. Gerilyn Pilgrim, M.D.  Electronically Signed    KAD/MEDQ  D:  11/14/2008  T:  11/14/2008  Job:  [id number]

## 2011-01-27 NOTE — Consult Note (Signed)
NAME:  [name], Diane                 ACCOUNT NO.:  [id number]   MEDICAL RECORD NO.:  [id number]          PATIENT TYPE:  OBV   LOCATION:  A227                          FACILITY:  [facility]   PHYSICIAN:  J. Darreld Mclean, M.D. DATE OF BIRTH:  10-04-1950   DATE OF CONSULTATION:  07/10/2006  DATE OF DISCHARGE:                                   CONSULTATION   REFERRING PHYSICIAN:  Osvaldo Shipper, MD   HISTORY OF PRESENT ILLNESS:  The patient is a 60 year old female with pain  and tenderness in her left shoulder.  She has been seen by Dr. Lodema Hong on  the outpatient side and Dr. Rito Ehrlich as an inpatient.  She had an MRI of her  left shoulder done on October 29.  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  She has tendinopathy, but no discrete full-thickness rotator  cuff tear on the left.  There is intact biceps tendons and intact glenoid  labra.  The patient has decreased range of motion of her shoulder.  I have  seen her and she just finished a course of physical therapy to the shoulder  and she is tender.  She is weak on the left side secondary to an old stroke.  Neurovascularly, she is otherwise intact.  Motion is tender.  She has some  slight crepitus.   IMPRESSION:  Bursitis, left shoulder, status post left cerebrovascular  accident.   RECOMMENDATIONS:  I want to continue the physical therapy.  I have injected  left shoulder solution of Xylocaine 1% and Depo-Medrol 40 mg 1 mL with good  results.  Continue PT.  Continue to exercise.  I will follow with you and I  can follow her as an outpatient as well.           ______________________________  J. Darreld Mclean, M.D.     JWK/MEDQ  D:  07/10/2006  T:  07/10/2006  Job:  [id number]

## 2011-01-27 NOTE — Op Note (Signed)
NAME:  Diane [name], Diane [name]                 ACCOUNT NO.:  [id number]   MEDICAL RECORD NO.:  [id number]          PATIENT TYPE:  AMB   LOCATION:  DAY                           FACILITY:  [facility]   PHYSICIAN:  R. Roetta Sessions, M.D. DATE OF BIRTH:  Dec 20, 1950   DATE OF PROCEDURE:  06/28/2006  DATE OF DISCHARGE:  06/28/2006                                 OPERATIVE REPORT   Esophagogastroduodenoscopy with Elease Hashimoto dilation followed by colonoscopy  with snare polypectomy and biopsy.   INDICATIONS FOR PROCEDURE:  A 60 year old lady with a one-year history of  intermittent hematochezia and long-standing gastroesophageal reflux disease  symptoms and esophageal dysphagia. EGD and colonoscopy are now being done.  This approach has been discussed with the patient at length. Potential  risks, benefits, and alternatives have been reviewed and questions answered.  She is agreeable. Please see documentation in the medical record.   PROCEDURE NOTE:  O2 saturation, blood pressure, pulse, and respirations were  monitored throughout the entire procedure. Conscious sedation with Versed 7  mg IV and Demerol 150 mg IV in divided doses.   INSTRUMENT:  Olympus video chip system.   FINDINGS:  Examination of the tubular esophagus revealed no abnormalities.  EG junction easily traversed.   Stomach:  Gastric cavity was empty and insufflated well with air. Thorough  examination of gastric mucosa including retroflexed view of the proximal  stomach and esophagogastric junction demonstrated only a small hiatal  hernia. Pylorus patent and easily traversed. Examination of bulb and second  portion revealed no abnormalities.   THERAPEUTIC/DIAGNOSTIC MANEUVERS:  A 56-French Maloney dilator was passed to  full insertion. A look back revealed no apparent complication related to  passage of the dilator.   The patient tolerated the procedure well and was prepared for colonoscopy.  Digital rectal revealed no abnormalities.   ENDOSCOPIC FINDINGS:  Unfortunately, the prep was marginal. There was  somewhat lax sphincter tone. Examination of the rectal mucosa including  retroflexed view of the anal verge revealed a 3-mm polyp 10 cm in from the  anal verge and some internal hemorrhoids.   Colon:  Colonic mucosa was surveyed from the rectosigmoid junction through  the left, transverse, and right colon to the area of the appendiceal  orifice, ileocecal valve, and cecum. These structures were well seen and  photographed for the record. From this level, the scope was slowly  withdrawn, and all previously mentioned mucosal surfaces were again seen.   The patient had the following abnormalities:  1. She had pancolonic diverticula.  2. She had a 5-mm polyp in the base of the cecum which was engaged with a      snare and removed with cold-snare technique. There were some difficulty      in disengaging the snare from the polyp base.  This was ultimately done      without difficulty, and the polyp was pretty much destroyed in the      process and was not recovered. There was a second 5-mm polyp in the mid      ascending colon that was cold snared and  recovered through the scope.      The remainder of the colonic mucosa appeared normal. The polyp in the      rectum was cold biopsied/removed.   The patient tolerated both procedures well and was reactive to endoscopy.   IMPRESSION:  Esophagogastroduodenoscopy:  Normal esophagus. Small hiatal  hernia. Otherwise normal stomach, D1 and D2, status post passage of a 56-  [place] Maloney dilator.   Colonoscopy findings:  1. Internal hemorrhoids. Diminutive rectal polyps, cold biopsied. The      remainder of the rectal mucosa appeared normal.  2. Pancolonic diverticula. Polyps in the right colon removed with cold      snare as described above.   The prep for her colonoscopy was marginal. Other small lesions may not have  been seen. I suspect the patient has had low-volume  hematochezia secondary  to hemorrhoids.   RECOMMENDATIONS:  1. Continue Prilosec 20 mg orally daily.  2. No aspirin or arthritis medications for 10 days  3. Diverticulosis literature provided to Ms. Alderman.  4. Follow up on pathology.  5. Hemorrhoid literature provided to Ms. Birkhead.  6. Ten-day course of Anusol HC suppositories 1 per rectum at bedtime.      Jonathon Bellows, M.D.  Electronically Signed     RMR/MEDQ  D:  06/28/2006  T:  06/30/2006  Job:  [id number]   cc:   Rhae Lerner. Margretta Ditty, M.D.  [address]  [place]  [place] [address]   Milus Mallick. Lodema Hong, M.D.  Fax: [phone]

## 2011-01-27 NOTE — Procedures (Signed)
NAME:  Diane [name], Diane [name]                 ACCOUNT NO.:  [id number]   MEDICAL RECORD NO.:  [id number]          PATIENT TYPE:  INP   LOCATION:  A227                          FACILITY:  [facility]   PHYSICIAN:  Gerrit Friends. Dietrich Pates, MD, FACCDATE OF BIRTH:  10-04-1950   DATE OF PROCEDURE:  07/10/2006  DATE OF DISCHARGE:                                  ECHOCARDIOGRAM   REFERRING PHYSICIAN:  Dr. Rito Ehrlich and Dr. Dietrich Pates.  echo   CLINICAL DATA:  A 60 year old woman with hypertension and CVA.   I dictated this already.      Gerrit Friends. Dietrich Pates, MD, [facility]  Electronically Signed     RMR/MEDQ  D:  07/11/2006  T:  07/11/2006  Job:  [id number]

## 2011-01-27 NOTE — Op Note (Signed)
   NAME:  Diane [name], Diane [name]                           ACCOUNT NO.:  [id number]   MEDICAL RECORD NO.:  [id number]                   PATIENT TYPE:  INP   LOCATION:  A327                                 FACILITY:  [facility]   PHYSICIAN:  Jerolyn Shin C. Katrinka Blazing, [name].D.                DATE OF BIRTH:  1951-02-13   DATE OF PROCEDURE:  07/16/2003  DATE OF DISCHARGE:                                 OPERATIVE REPORT   PREOPERATIVE DIAGNOSIS:  Acute cholecystitis, cholelithiasis.   POSTOPERATIVE DIAGNOSIS:  Acute cholecystitis, cholelithiasis.   OPERATION/PROCEDURE:  Laparoscopic cholecystectomy.   SURGEON:  Dirk Dress. Katrinka Blazing, [name].D.   DESCRIPTION OF PROCEDURE:  Under general endotracheal anesthesia the  patient's abdomen was prepped and draped into a sterile field.  A  supraumbilical midline incision was made.  A Veress needle was inserted  uneventfully.  Abdomen was insufflated with 2.5 L of CO2.  Using a Visiport  guide, a 10 mm port was placed uneventfully.  Laparoscope was placed and an  acutely inflamed gallbladder was visualized.  Under videoscopic guidance, a  10 mm port and two 5 mm ports were placed.  The gallbladder was positioned.  Cystic duct was dissected and clipped with four clips and divided.  Cystic  artery was dissected, clipped with three clips and divided.  Each was  clipped with three clips and divided.  The gallbladder was separated from  the intrahepatic space with the electrocautery.  There was significant  inflammation oozing from the gallbladder bed.  Hemostasis was felt not to be  adequate so a Jackson-Pratt drain was placed and brought out through the  most lateral incision.  The abdomen was inspected.  No major bleeding was  noted.  CO2 was allowed to escape from the abdomen and the ports were  removed.  The incisions were closed using 0 Dexon on the fascia at the  umbilicus and the staples from each of the other incisions.  The patient  tolerated the procedure well.  She was awakened  from anesthesia  uneventfully, transferred to a bed and taken to the post anesthesia care  unit.      ___________________________________________                                            Dirk Dress. Katrinka Blazing, [name].D.   LCS/MEDQ  D:  07/17/2003  T:  07/17/2003  Job:  [id number]   cc:   Milus Mallick. Lodema Hong, [name].D.  [address]  [place], [place] [address]  Fax: [phone]

## 2011-01-27 NOTE — H&P (Signed)
NAME:  Diane [name], Diane [name]                 ACCOUNT NO.:  [id number]   MEDICAL RECORD NO.:  [id number]          PATIENT TYPE:  OBV   LOCATION:  A227                          FACILITY:  [facility]   PHYSICIAN:  Osvaldo Shipper, MD     DATE OF BIRTH:  07-18-1951   DATE OF ADMISSION:  07/09/2006  DATE OF DISCHARGE:  LH                                HISTORY & PHYSICAL   PRIMARY CARE PHYSICIAN:  Milus Mallick. Lodema Hong, M.D.   ADMISSION DIAGNOSES:  1. Left-sided weakness of unclear etiology.  2. Left shoulder pain with possible rotator cuff tear.  3. History of low back pain with L4-5 diskectomy, foraminotomy and      decompression.  4. History of hypertension.  5. Obesity.  6. Depression.  7. Hypothyroidism.  8. Dyslipidemia.   CHIEF COMPLAINT:  Left-sided weakness and numbness, worse for 1 day.   HISTORY OF PRESENT ILLNESS:  The patient is a 60 year old African-American  female who has the medical problems as stated above.  She presented to the  ED today complaining of weakness on the left side and numbness.  The patient  actually was in the radiology department undergoing an MRI of the left  shoulder which apparently detected a left rotator cuff tear.  The patient,  however, was complaining of left-sided weakness and numbness which have been  ongoing, according to the patient, for about a month but was acutely  exacerbated yesterday when she felt really weak and her left leg actually  gave out.  She was not able to walk without assistance, and her left leg was  dragging according to the patient.  She did not have symptoms suggestive of  slurred speech or difficulty swallowing.  No history of chest pain,  shortness of breath, nausea or vomiting.  She is complaining of left  shoulder pain.  She is complaining also of pain in her left leg on the outer  aspect of the lower leg.  The patient denies any back pain at this time.  She denies any trauma to her head.  She denies any seizure disorder.  She  has occasional urinary incontinence but none recently.  She also has chronic  lower abdominal pain of unclear etiology as well.  No history of diarrhea.  No history of any fevers or chills at home.  No history of seizure disorder.   MEDICATIONS AT HOME:  Include the following, however, this is not a complete  list as the patient does not remember most of them:  1. Ambien CR 12.5 mg at bedtime.  2. Amitriptyline 50 mg at bedtime.  3. Clonazepam 0.5 mg 3 times daily as needed for anxiety.  4. Crestor 10 mg every night.  5. Hydrochlorothiazide 25 mg daily.  6. Levothyroxine 200 mcg once daily.  7. Phenergan 25 mg q.6 h. p.r.n.  8. Potassium chloride 20 mEq daily.  9. Skelaxin 400 mg 4 times daily.  10.Relafen 750 mg twice daily.  11.Vicodin 5/500 mg as needed.  12.Celexa, unknown dose.   ALLERGIES:  NEOSPORIN (RASH).  SULFA DRUGS CAUSE HIVES.   PAST  MEDICAL HISTORY:  Arthritis, depression, acid reflux disease,  dyslipidemia, hypertension, hypothyroidism, anxiety disorder.  No history of  heart disease or lung disease.  She thinks she might have had a TIA in 2005  though she is not sure.   PAST SURGICAL HISTORY:  1. Hysterectomy x2, first was a partial hysterectomy.  2. She has had surgeries to her feet.  3. She has had surgery to the back.  This was in January of 2007, as      mentioned above.  4. She has also had a colonoscopy and EGD.  These were done on June 28, 2006.  These revealed an adenomatous and hyperplastic polyp, internal      hemorrhoids, diverticula.  Otherwise, unremarkable.   SOCIAL HISTORY:  Lives in [place] with a friend.  Quit smoking 10 years ago.  Occasional alcohol use.  No illicit drug use.  She is disabled because of  her back pain.  Up until yesterday, she was independent with her ADLs.   FAMILY HISTORY:  Significant for heart disease in the family and alcoholism.  Otherwise, unremarkable.   REVIEW OF SYSTEMS:  GENERAL:  Weakness.  HEENT:   Occasional headaches.  RESPIRATORY:  Unremarkable.  CARDIOVASCULAR:  Unremarkable.  GI:  Chronic  lower abdominal pain.  GU:  Please see HPI.  MUSCULOSKELETAL:  Please see  HPI.  NEUROLOGICAL:  Please see HPI.  Otherwise unremarkable.   PHYSICAL EXAMINATION:  VITAL SIGNS:  Temperature 98.4, blood pressure  128/96, heart rate 70s, respiratory rate 18, O2 saturation 98% on room air.  GENERAL:  This is a morbidly obese female in no distress.  Mild discomfort  appears to be present.  HEENT:  There is no pallor, no icterus.  Oral mucous membranes are moist.  No oral lesions are noted.  NECK:  Soft and supple.  No thyromegaly is appreciated.  LUNGS:  Clear to auscultation bilaterally.  CARDIOVASCULAR:  S1 and S2 normal.  Regular.  No murmurs appreciated.  ABDOMEN:  Obese, nondistended.  Bowel sounds are present.  There is lower  abdominal tenderness on both sides but no rebound, rigidity or guarding.  No  masses or organomegaly is appreciated.  EXTREMITIES:  Without edema.  Peripheral pulses are palpable.  NEUROLOGIC:  The patient is alert and oriented x3.  No cranial deficits are  present.  Strength on the right side is 5/5.  Left upper extremity is 4/5.  Left lower extremity is 3/5.  Reflexes are equal bilaterally.  Gait was not  assessed because of her significant weakness.  On sensory exam, she had less  perception of soft touch on her left leg on the lateral aspect.   LABORATORY DATA:  CBC was unremarkable.  B-Met showed glucose of 108,  otherwise unremarkable.  No EKG is available.  No chest x-ray is available.  She did have a CT scan of her head without contrast which did not show any  acute intracranial process.  MRI of the left shoulder was done.  However, I  do not have a report on that quite yet.   IMPRESSION:  This is a 60 year old African-American female with multiple medical problems who presents with left-sided weakness ongoing for almost a  month with acute exacerbation  present since yesterday according to her and  her daughter.  She has fallen at least twice.  Once was yesterday, and the  other was a few days ago.  She did have significant pain when I tried to  lift her left leg.  The pain was located, however, in the lower leg.  I am  not sure if her weakness is neurological or not.  It is difficult to  appreciate at this time.  Some of the issue could be musculoskeletal.  She  does have back problems which is a chronic problem for which she has had  surgery.  I am not sure if that is contributing to her symptoms at all.  In  a sense, we do not have a good assessment of the etiology at this time.  It  could be a combination of neurological deficit as well as musculoskeletal  deficits.   PLAN:  1. Left-sided weakness.  We will get an MRI of the brain.  We will get an      echocardiogram, carotid Dopplers, ESR, homocysteine, lipid profile.  We      will get PT and OT involved.  We will also have her seen by our      neurologist, Dr. Gerilyn Pilgrim.  EKG will be done as well.  Because of this      lower leg pain that she mentioned, we will get an MRI of the      lumbosacral spine to rule out any disk-associated radiculopathic      changes.  We will also get an x-ray of her left leg to rule out any      bony trauma in that region.  2. Left shoulder pain.  I was told this was related to some problem with      the rotator cuff, possibly a tear.  I will get an orthopedic      consultation for this at this time.  3. Chronic abdominal pain.  I will let her PMD handle this situation.  I      do not see any evidence for an acute abdomen at this time.  I think      workup can be continued as an outpatient.  4. We will continue most of her home medications.  We also requested the      patient's daughter to get a list of her current medications from home.      We will also check a TSH and a free T4.  DVT and GI prophylaxis will be      initiated.   Further management  decisions will be based on the results of initial testing  and the patient's response to treatment.      Osvaldo Shipper, MD  Electronically Signed     GK/MEDQ  D:  07/09/2006  T:  07/09/2006  Job:  [id number]   cc:   Milus Mallick. Lodema Hong, M.D.  Fax: [phone]

## 2011-01-27 NOTE — H&P (Signed)
NAME:  [name], Diane                 ACCOUNT NO.:  [id number]   MEDICAL RECORD NO.:  [id number]          PATIENT TYPE:  AMB   LOCATION:  DAY                           FACILITY:  [facility]   PHYSICIAN:  R. Roetta Sessions, M.D. DATE OF BIRTH:  [birth year]   DATE OF ADMISSION:  DATE OF DISCHARGE:  LH                                HISTORY & PHYSICAL   REASON FOR CONSULTATION:  Hematochezia.   HISTORY OF PRESENT ILLNESS:  Ms. Diane [name] is a pleasant 60 year old obese  African American female sent over in the courtesy of Dr. Margretta Ditty to further  evaluate hematochezia and Ms. Dickerman reports intermittent rectal bleeding  sometimes related to constipation sometimes not, going on a good year or so.  She is followed primarily by Dr. Lodema Hong, but has not complained to anybody  until she was seen in the ED recently.  Ms. Mable states she had a rectal  exam; is unaware of the findings in the ED.  She did have some lab work  which include CBC which revealed a white count of 6.2, H&H 12.7/37.8, MCV  85.9.  She tells me she intermittent blood mixed in the toilet bowl and on  the stool when she has a bowel movement.  She has never had her lower GI  tract evaluated.  There is no family history of colorectal neoplasia or  inflammatory bowel disease.  She also has longstanding gastroesophageal  reflux disease and relates intermittent esophageal dysphagia to solids.  She  often times gets choked and has to bring the food back up.  She has never  had any imaging of her upper GI tract.  She rarely consumes alcohol and  smoked up until 15 years ago when she stopped.  She has not had any melena.  No other upper GI tract symptoms such as odynophagia, early satiety, nausea  or vomiting.  She has not lost any weight.   PAST MEDICAL HISTORY SIGNIFICANT FOR:  1. GERD.  2. Hypertension.  3. Hypothyroidism.  4. Chronic low back pain.   PAST SURGERIES:  Cholecystectomy.  She has had foot and back surgery, and a  hysterectomy.   CURRENT MEDICATIONS:  1. Ambien CR 12.5 mg daily.  2. Amitriptyline 50 mg at bedtime.  3. Clonazepam 0.5 mg t.i.d.  4. Crestor 10 mg nightly.  5. Hydrochlorothiazide 25 mg daily.  6. Levothyroxine 200 mcg daily.  7. Phenergan mg q.6 h. p.r.n.  8. Potassium chloride 20 mEq daily.  9. Skelaxin 400 mg q.i.d.  10.Relafen 750 mg b.i.d.  11.Prilosec 20 mg daily.  12.Vicodin 5/500 1-2 daily p.r.n.  13.Lyrica 150 mg twice daily.  14.Vicoprofen 1-2 q.4-6 h. p.r.n.  15.Cymbalta 60 mg daily.   ALLERGIES:  SULFA, NEOSPORIN.   FAMILY HISTORY:  Mother is alive.  Father died with an MI in his 58s.  No  history of chronic GI or liver illness.   SOCIAL HISTORY:  Patient is married and has 2 children.  She is disabled.  She stopped smoking 15 years ago.  Rarely consumes alcohol.   REVIEW OF SYSTEMS:  No  chest pain.  No dyspnea on exertion.  She apparently  has gained an unspecified amount of weight recently.  Otherwise, as in the  history of present illness.   PHYSICAL EXAMINATION:  GENERAL:  She is accompanied by her daughter.  Pleasant 26 year old lady resting comfortably.  VITAL SIGNS:  Weight 234, height 5 feet 3 inches, temp 98.4, BP 130/82,  pulse 80.  SKIN:  Warm and dry.  HEENT EXAM:  No scleral icterus.  Conjunctivae are pink. Oral cavity:  No  lesions.  CHEST:  Lungs are clear to auscultation.  CARDIAC EXAMINATION:  Regular rate and rhythm without murmur, gallop, or  rub.  ABDOMEN:  Nondistended.  Positive bowel sounds.  Soft, nontender without  appreciable mass or organomegaly.  RECTAL EXAMINATION:  Deferred to time of colonoscopy.   IMPRESSION:  Diane [name] is a pleasant 60 year old lady with a 1-year  history of  intermittent hematochezia.  She also has longstanding  gastroesophageal reflux disease and reports esophageal dysphagia to me.   This lady needs to have her lower gastrointestinal tract evaluated.  I have  offered this nice lady a colonoscopy.   Potential risks, benefits, and  alternatives have been reviewed.  She also has the alarming symptom of  dysphagia which also demands further evaluation.  To this end, I have  offered Ms. Romo an EGD at the time of colonoscopy and told her that we  may need to dilate her esophagus depending on what is found.  Potential  risks, benefits, and alternatives of this approach have been reviewed.  All  questions were answered.  Parties are agreeable.  Plan to  perform EGD with possible esophageal dilation coupled with colonoscopy in  the very near future at [facility] and make further recommendations  in the near future.   As always, I would like to thank Dr. Earlyne Iba for his continued  confidence in me.      Jonathon Bellows, M.D.  Electronically Signed     RMR/MEDQ  D:  06/21/2006  T:  06/22/2006  Job:  [id number]   cc:   Rhae Lerner. Margretta Ditty, M.D.  [address]  [place]  [place] [address]   Milus Mallick. Lodema Hong, M.D.  Fax: [phone]

## 2011-01-27 NOTE — Discharge Summary (Signed)
NAME:  Jenavee, Laguardia Derrisha                 ACCOUNT NO.:  [id number]   MEDICAL RECORD NO.:  [id number]          PATIENT TYPE:  INP   LOCATION:  A227                          FACILITY:  [facility]   PHYSICIAN:  Michaelyn Barter, M.D. DATE OF BIRTH:  18-Dec-1950   DATE OF ADMISSION:  07/09/2006  DATE OF DISCHARGE:  11/02/2007LH                               DISCHARGE SUMMARY   PRIMARY CARE PHYSICIAN:  Kerri Perches, M.D.   FINAL DIAGNOSES:  1. Radiculopathy.  2. Left shoulder pain.  3. Depression.   CONSULTATIONS:  1. Orthopedic surgery, with Dr. Teola Bradley.  2. Neurology with Dr. Darleen Crocker A. [place].   PROCEDURES:  1. MRI of the lumbar spine without and with contrast.  2. MRI of the brain with and without contrast, completed October29,      2007.  3. CT scan of the head without contrast, October29,2007.  4. MRI of the left shoulder, October29,2007.  5. Left ankle x-ray, October30,2007.  6. X-ray of the left leg, October30,2007.  7. Bilateral carotid duplex, November30,2007.   HISTORY OF PRESENT ILLNESS:  Ms. Hightower is a 60 year old female who  arrived with a chief complaint of left-sided weakness and numbness over  the course of one day.  She was in the radiology department undergoing  an MRI of the left shoulder which had detected a left rotator cuff tear.  She complained of some left-sided weakness and numbness which had been  persisting for at least a month but became worse on the day prior to  this admission. She also experienced some weakness in her left leg to  the point that it gave out.  She required assistance to ambulate.  She  denied having any chest pain, no slurred speech.  She complained of some  left leg pain on the outer aspect of the lower extremity.   PAST MEDICAL HISTORY:  Please see that dictated by Dr. Osvaldo Shipper.   HOSPITAL COURSE:  1. Left arm and leg weakness.  An MRI of the patient's left spine was      completed on October29, 2007.  The final  impression was that there      was recurrent disc herniation in the left posterolateral direction      at L4-5 that one would expect to compress the L5 nerve root. Disc      herniations were also seen at T11-12, T12-L1, with either some disc      material migrated behind T12 or tenting of the posterior      longitudinal ligament. The ventral subarachnoid space was narrow,      but the radiologist doubted that there was any compression of the      neural structures present.  The MRI of the brain was negative. CT      scan of the brain was also negative.  No acute intracranial      abnormalities.  MRI of the left shoulder was completed on      October29, 2007. It revealed [facility] joint degenerative changes, and a      type II-III acromion may be  contributing to bony impingement.      Tendinopathy involving the infraspinous and supraspinatus junction      region in particular were seen.  Shallow articular surface tear and      areas of bursal surface irregularity were noted.  No discrete full-      thickness rotator cuff care was seen. The patient also had an x-ray      of her left ankle which revealed no acute finding. X-rays of the      left leg revealed no acute bony or joint abnormalities, and      ultrasounds of the carotids revealed early bilateral carotid      bifurcation plaque, without significant stenosis. Continued      surveillance was recommended.  Orthopedic surgery was consulted,      and Dr. Hilda Lias responded to the consultation.  His impression was      that the patient was suffering from bursitis involving the left      shoulder, status post left CVA.  He wanted to continue physical      therapy for now.  He injected the left shoulder with a solution of      Xylocaine 1% and Depo-Medrol 40 mg, and stated that there were good      results seen.  He recommended that PT and exercises be continued,      and stated that he will continue to follow the patient as an      outpatient.   Over the course of her hospitalization, the patient      has indicated that her shoulder pain has improved significantly,      and as of today the patient has no current complaints.  2. Radiculopathy. See #1 above. Dr. Gerilyn Pilgrim the neurologist was      consulted.  He saw the patient on October30, 2007.  He indicated      that the patient's left side weakness was due to L5 radiculopathy      and stated that the pain was likely secondary to rotator cuff      pathology. He recommended a trial of pulse steroids.  Potential      treatment could also include epidural injection of the L5 nerve      root on the left. He also recommend an orthopedic surgery      consultation.  3. Depression.  This has remained relatively stable. As of today      July 13, 2006, the patient currently states that she feels      better.  She states that her left shoulder pain is better, and she actually  requested to be discharged home today.  Today the patient's vitals are  temperature is 97.4, heart rate 62, respirations 19, blood pressure  137/86  The patient will be discharged on the following medications:  1. Cymbalta 60 mg 1 tablet p.o. b.i.d.  2. Neurontin 300 mg p.o. t.i.d.  3. Levothyroxine 200 mg p.o. daily.  4. Zocor 80 mg p.o. daily.  5. Lortab 5/500, 1 tablet p.o. q.6 h/ p.r.n.  6. Albuterol MDI 2 puffs q.4 h p.r.n.   The patient should follow up with her primary care physician within the  next 2-4 weeks, and also call Dr. Hilda Lias to schedule a follow-up  appointment.      Michaelyn Barter, M.D.  Electronically Signed     OR/MEDQ  D:  07/13/2006  T:  07/13/2006  Job:  [id number]   cc:   Milus Mallick.  Lodema Hong, M.D.  Fax: [phone]

## 2011-02-07 ENCOUNTER — Encounter (INDEPENDENT_AMBULATORY_CARE_PROVIDER_SITE_OTHER): Payer: Medicare HMO | Admitting: Psychiatry

## 2011-02-07 DIAGNOSIS — F3189 Other bipolar disorder: Secondary | ICD-10-CM

## 2011-02-08 NOTE — Group Therapy Note (Signed)
  NAME:  Diane [name], Diane [name]                 ACCOUNT NO.:  [id number]  MEDICAL RECORD NO.:  [id number]           PATIENT TYPE:  A  LOCATION:  [facility]                           FACILITY:  [facility]  PHYSICIAN:  Syed T. Arfeen, M.D.   DATE OF BIRTH:  1951/06/23                                PROGRESS NOTE   The patient came in today for her follow-up appointment.  She was last seen on May 10.  At that time she was noncompliant with her medication including Elavil and Neurontin and experiencing increased depression, poor sleep and anxiety.  Now she is back on medication; however, she is complaining excessive sleep at this time and no energy.  She bring the bag filled with medication bottles and want to make sure that if she is taking the right medication.  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  She denies any other concerns.  MENTAL STATUS EXAM: The patient is calm, cooperative, somewhat tired but cooperative and maintained good eye contact and speech is soft and coherent, thought process was logical, linear and goal-directed.  She denies any auditory hallucinations, suicidal thoughts or homicidal thoughts.  Her attention and concentration were okay.  There were no psychosis present.  She is alert and oriented x3.  Her insight, judgment, impulse control were okay.  ASSESSMENT/PLAN: Bipolar disorder, alcohol abuse.  I reviewed her medication.  Her current medications are 1. Nabumetone 500 mg twice a day. 2. Levothyroxine 150 mcg daily. 3. Vitamin D 50,000 units weekly. 4. Neurontin 300 mg 3 times a day. 5. Depakote 500 mg at bedtime. 6. Crestor 40 mg daily. 7. Potassium 1 daily. 8. Hydrocodone 7.5/325 every 4 hours. 9. Xanax 0.5 mg 3 times a day. 10.Oxybutynin 5 mg twice a  day. 11.Elavil 100 mg at bedtime. 12.Loratadine 10 mg daily. 13.Flexeril 10 mg 3 times a day. 14.Metaxalone 800 mg 3 times a day.  It appears as the patient is taking too much pain medication.  She does not want to cut down any of her pain medication as she seems that the current pain medicine are managing her pain very well.  I had talked about cutting down her Elavil from 100 mg to 50 mg which she agreed.  We will decreased the Elavil to 50 mg at bedtime so she has less complaint of sedation in the day.  I explained the risks and benefits of medication in detail including using the pain medication and a sporadic use of alcohol which she acknowledged.  I recommended to call if she had any questions. Otherwise I will see her in 4 weeks.     Syed T. Lolly Mustache, M.D.     STA/MEDQ  D:  02/07/2011  T:  02/07/2011  Job:  [id number]  Electronically Signed by Kathryne Sharper M.D. on 02/08/2011 08:58:51 AM

## 2011-02-27 ENCOUNTER — Other Ambulatory Visit (HOSPITAL_COMMUNITY): Payer: Self-pay | Admitting: Internal Medicine

## 2011-02-27 ENCOUNTER — Encounter (INDEPENDENT_AMBULATORY_CARE_PROVIDER_SITE_OTHER): Payer: Medicare HMO | Admitting: Psychology

## 2011-02-27 DIAGNOSIS — F39 Unspecified mood [affective] disorder: Secondary | ICD-10-CM

## 2011-02-27 DIAGNOSIS — M5412 Radiculopathy, cervical region: Secondary | ICD-10-CM

## 2011-03-01 ENCOUNTER — Other Ambulatory Visit (HOSPITAL_COMMUNITY): Payer: Self-pay | Admitting: Internal Medicine

## 2011-03-01 ENCOUNTER — Ambulatory Visit (HOSPITAL_COMMUNITY)
Admission: RE | Admit: 2011-03-01 | Discharge: 2011-03-01 | Disposition: A | Discharge status: Home / Self Care | Payer: Medicare HMO | Source: Ambulatory Visit | Attending: Internal Medicine | Admitting: Internal Medicine

## 2011-03-01 DIAGNOSIS — M5412 Radiculopathy, cervical region: Secondary | ICD-10-CM

## 2011-03-01 DIAGNOSIS — M542 Cervicalgia: Secondary | ICD-10-CM | POA: Insufficient documentation

## 2011-03-01 DIAGNOSIS — M5126 Other intervertebral disc displacement, lumbar region: Secondary | ICD-10-CM | POA: Insufficient documentation

## 2011-03-01 DIAGNOSIS — Z139 Encounter for screening, unspecified: Secondary | ICD-10-CM

## 2011-03-07 ENCOUNTER — Ambulatory Visit (HOSPITAL_COMMUNITY): Payer: Medicare HMO

## 2011-03-07 ENCOUNTER — Encounter (INDEPENDENT_AMBULATORY_CARE_PROVIDER_SITE_OTHER): Payer: Medicare HMO | Admitting: Psychiatry

## 2011-03-07 DIAGNOSIS — F39 Unspecified mood [affective] disorder: Secondary | ICD-10-CM

## 2011-03-09 ENCOUNTER — Encounter (HOSPITAL_COMMUNITY): Payer: Medicare HMO | Admitting: Psychiatry

## 2011-03-14 ENCOUNTER — Encounter (INDEPENDENT_AMBULATORY_CARE_PROVIDER_SITE_OTHER): Payer: Medicare HMO | Admitting: Psychology

## 2011-03-14 DIAGNOSIS — F39 Unspecified mood [affective] disorder: Secondary | ICD-10-CM

## 2011-03-29 ENCOUNTER — Ambulatory Visit (INDEPENDENT_AMBULATORY_CARE_PROVIDER_SITE_OTHER): Payer: Medicare HMO | Admitting: Orthopedic Surgery

## 2011-03-29 ENCOUNTER — Encounter: Payer: Self-pay | Admitting: Orthopedic Surgery

## 2011-03-29 ENCOUNTER — Ambulatory Visit: Payer: Medicare HMO | Admitting: Orthopedic Surgery

## 2011-03-29 VITALS — HR 78 | Ht 63.0 in | Wt 230.0 lb

## 2011-03-29 DIAGNOSIS — M238X9 Other internal derangements of unspecified knee: Secondary | ICD-10-CM

## 2011-03-29 DIAGNOSIS — M25369 Other instability, unspecified knee: Secondary | ICD-10-CM

## 2011-03-29 NOTE — Patient Instructions (Signed)
Diagnostic reason for your knee giving out.  #1 cartilage or ligament injury.  #2 weakness LEFT leg, secondary to spine problem for lower back problems.  Recommend physical therapy for the LEFT knee for instability he will come back for a followup visit. A few not better, then we can send her for the MRI of her knee

## 2011-03-29 NOTE — Progress Notes (Signed)
   New patient  Primary care physician, Dr. Sherwood Gambler.  Chief complaint I'm here 4 my LEFT knee.  60 year old female with greater than one year history of Instability of the LEFT knee with frequent falls, no history of trauma. Complaints of primarily diffuse LEFT knee pain with 9/10. Severity, throbbing and burning, which tends to come and go, better when she is not ambulating worse when she stands associated with catching and swelling.  Family History  Problem Relation Age of Onset  . Heart disease    . Diabetes    . Alcohol abuse     Past Medical History  Diagnosis Date  . Hypertension   . Allergic rhinitis   . Anxiety   . Depression   . Headaches, cluster   . GERD (gastroesophageal reflux disease)   . High cholesterol   . Hypothyroid   . Chronic back pain    Past Surgical History  Procedure Date  . Left foot x 2  . Foot surgery   . Partial hysterectomy   . Oophoectomy bilateral  . Cholecystectomy   . Lumbar disc surgery 4-5  . Back surgery    Review of Systems  Constitutional: Positive for malaise/fatigue.  HENT: Negative.   Eyes: Negative.   Respiratory: Negative.   Cardiovascular: Negative.   Gastrointestinal: Positive for heartburn and constipation.  Genitourinary: Positive for frequency.  Neurological: Negative.   Endo/Heme/Allergies: Negative.   Psychiatric/Behavioral: Positive for depression. The patient is nervous/anxious.      Vital signs are stable as recorded  General appearance is normal  The patient is alert and oriented x3  The patient's mood and affect are normal  Gait assessment: normal The cardiovascular exam reveals normal pulses and temperature without edema swelling.  The lymphatic system is negative for palpable lymph nodes  The sensory exam is normal.  There are no pathologic reflexes.  Balance is normal.  Upper extremity exam  Inspection and palpation revealed no abnormalities in the upper extremities.  Range of motion is  full without contracture.  Motor exam is normal with grade 5 strength.  The joints are fully reduced without subluxation.  There is no atrophy or tremor and muscle tone is normal.  All joints are stable.    Exam of the LEFT knee Inspection There is tenderness over the medial joint line and also the anterior portion of the knee, no joint effusion Range of motion 115 Stability Collateral ligaments were stable at zero and 30 area.  Cruciate ligaments were stable as well. Strength No weakness was detected Skin Normal

## 2011-04-14 ENCOUNTER — Encounter (HOSPITAL_COMMUNITY): Payer: Medicare HMO | Admitting: Psychology

## 2011-04-18 ENCOUNTER — Encounter (INDEPENDENT_AMBULATORY_CARE_PROVIDER_SITE_OTHER): Payer: Medicare HMO | Admitting: Psychiatry

## 2011-04-18 DIAGNOSIS — F3189 Other bipolar disorder: Secondary | ICD-10-CM

## 2011-04-24 ENCOUNTER — Encounter (INDEPENDENT_AMBULATORY_CARE_PROVIDER_SITE_OTHER): Payer: Medicare HMO | Admitting: Psychology

## 2011-04-24 DIAGNOSIS — F39 Unspecified mood [affective] disorder: Secondary | ICD-10-CM

## 2011-05-11 ENCOUNTER — Ambulatory Visit (INDEPENDENT_AMBULATORY_CARE_PROVIDER_SITE_OTHER): Payer: Medicare HMO | Admitting: Orthopedic Surgery

## 2011-05-11 DIAGNOSIS — IMO0002 Reserved for concepts with insufficient information to code with codable children: Secondary | ICD-10-CM

## 2011-05-11 DIAGNOSIS — M238X9 Other internal derangements of unspecified knee: Secondary | ICD-10-CM

## 2011-05-11 MED ORDER — HYDROCODONE-ACETAMINOPHEN 7.5-325 MG PO TABS: 1.0000 | ORAL_TABLET | Freq: Four times a day (QID) | ORAL | Status: AC | PRN

## 2011-05-11 NOTE — Patient Instructions (Signed)
Continue with therapy for your back

## 2011-05-11 NOTE — Progress Notes (Signed)
   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 Complaints of primarily diffuse LEFT knee pain with 9/10. Severity, throbbing and burning, which tends to come and go, better when she is not ambulating worse when she stands associated with catching and swelling.   Patient has improved with physical therapy and bracing of the LEFT knee.  While in therapy she indicated that she starting to have some radiating pain from the back to the knee area  She agrees that this is starting to occur and would like treatment for that as well  Recommend physical therapy LEFT knee  Medication dispensed Norco 7.5 mg for pain  Patient is advised that the medication can cause dependence and she is aware of this secondary to previous stents with pain management  She is agreed to not make this a chronic situation in terms of medication

## 2011-05-16 ENCOUNTER — Encounter (HOSPITAL_COMMUNITY): Payer: Medicare HMO | Admitting: Psychology

## 2011-05-30 ENCOUNTER — Encounter (HOSPITAL_COMMUNITY): Payer: Medicare HMO | Admitting: Psychiatry

## 2011-05-31 ENCOUNTER — Encounter (HOSPITAL_COMMUNITY): Payer: Medicare HMO | Admitting: Psychology

## 2011-07-04 ENCOUNTER — Encounter (INDEPENDENT_AMBULATORY_CARE_PROVIDER_SITE_OTHER): Payer: Medicare HMO | Admitting: Psychiatry

## 2011-07-04 DIAGNOSIS — F3189 Other bipolar disorder: Secondary | ICD-10-CM

## 2011-08-19 ENCOUNTER — Other Ambulatory Visit (HOSPITAL_COMMUNITY): Payer: Self-pay | Admitting: Psychiatry

## 2011-09-26 ENCOUNTER — Encounter (HOSPITAL_COMMUNITY): Payer: Self-pay | Admitting: Psychiatry

## 2011-09-26 ENCOUNTER — Ambulatory Visit (INDEPENDENT_AMBULATORY_CARE_PROVIDER_SITE_OTHER): Payer: Medicare HMO | Admitting: Psychiatry

## 2011-09-26 ENCOUNTER — Encounter (HOSPITAL_COMMUNITY): Payer: Medicare HMO | Admitting: Psychiatry

## 2011-09-26 VITALS — Wt 227.0 lb

## 2011-09-26 DIAGNOSIS — F1010 Alcohol abuse, uncomplicated: Secondary | ICD-10-CM

## 2011-09-26 DIAGNOSIS — F39 Unspecified mood [affective] disorder: Secondary | ICD-10-CM

## 2011-09-26 MED ORDER — AMITRIPTYLINE HCL 50 MG PO TABS: 50.0000 mg | ORAL_TABLET | Freq: Every day | ORAL | Status: DC

## 2011-09-26 MED ORDER — DIVALPROEX SODIUM 500 MG PO DR TAB: 500.0000 mg | DELAYED_RELEASE_TABLET | Freq: Every day | ORAL | Status: DC

## 2011-09-26 MED ORDER — GABAPENTIN 100 MG PO CAPS: 100.0000 mg | ORAL_CAPSULE | Freq: Three times a day (TID) | ORAL | Status: DC

## 2011-09-26 NOTE — Progress Notes (Signed)
Patient came for her followup appointment she was last seen in October 2012. Patient admitted she was having health issues and she was very dizzy with her family and could not come for her appointment. Patient reported her best friend died around Christmastime and she was very down. She admitted having drinking on New Year's Eve but denies any intoxication or blackouts. The patient usually minimize her drinking. She also taking pain medication from her primary care physician. She continues to have insomnia anxiety and wondering if her medication can be further increased. She is not seeing therapist for past few months however like to restart therapy and this office. She denies any agitation anger or mood swings but admitted getting easily frustrated and anxious.  Mental status examination Patient is mildly obese female who is casually dressed and fairly groomed. Her speech is clear and coherent. Her attention and concentration is poor and distracted at times. She denies any active or passive suicidal thoughts or homicidal thoughts. She denies any auditory or visual hallucination. There no psychotic symptoms present. Her thought processes logical. She's alert and oriented x3. Her insight judgment and impulse control is okay.  Assessment Mood disorder NOS rule out bipolar disorder Alcohol abuse  Plan I talked to the patient at length about potential consequences of alcohol along with her psychotropic medication. Patient is agreed to get some help and like to see therapist again in this office so that she can stop drinking. I have explained risks and benefits of medication. I recommended to call us if she has any question or concern about the medication we also talked about compliance with followup appointment. I will see her again in 2 months

## 2011-10-06 ENCOUNTER — Ambulatory Visit (HOSPITAL_COMMUNITY): Payer: Medicare HMO | Admitting: Psychology

## 2011-10-12 ENCOUNTER — Ambulatory Visit (HOSPITAL_COMMUNITY)
Admission: RE | Admit: 2011-10-12 | Discharge: 2011-10-12 | Disposition: A | Discharge status: Home / Self Care | Payer: Medicare HMO | Source: Ambulatory Visit | Attending: Family Medicine | Admitting: Family Medicine

## 2011-10-12 ENCOUNTER — Other Ambulatory Visit (HOSPITAL_COMMUNITY): Payer: Self-pay | Admitting: Family Medicine

## 2011-10-12 DIAGNOSIS — M25569 Pain in unspecified knee: Secondary | ICD-10-CM | POA: Insufficient documentation

## 2011-10-16 ENCOUNTER — Ambulatory Visit (HOSPITAL_COMMUNITY): Payer: Medicare HMO | Admitting: Psychology

## 2011-11-02 ENCOUNTER — Ambulatory Visit (INDEPENDENT_AMBULATORY_CARE_PROVIDER_SITE_OTHER): Payer: Medicare HMO | Admitting: Orthopedic Surgery

## 2011-11-02 ENCOUNTER — Encounter: Payer: Self-pay | Admitting: Orthopedic Surgery

## 2011-11-02 VITALS — BP 84/50 | Ht 63.0 in | Wt 227.0 lb

## 2011-11-02 DIAGNOSIS — S8391XA Sprain of unspecified site of right knee, initial encounter: Secondary | ICD-10-CM

## 2011-11-02 DIAGNOSIS — IMO0002 Reserved for concepts with insufficient information to code with codable children: Secondary | ICD-10-CM

## 2011-11-02 NOTE — Progress Notes (Signed)
Patient ID: Diane [name], female   DOB: [birth year], 61 y.o.   MRN: [id number] Chief Complaint  Patient presents with  . Knee Pain    bilateral knee pain since last year, gradual onset, no known injury     This patient actually complains of recent onset of a fall and increasing RIGHT knee pain with underlying known chronic pain in her knee RIGHT and LEFT.  She received an injection in the RIGHT knee in November 2012 and again in February of 2013 no relief she's been on hematomas recently no relief she had a recent fall and since that time the pain is worse in the swelling is worse and she complains of 9/10 sharp burning constant pain associated with bruising catching and swelling which is worse when she is trying to walk  Review of systems blurred vision shortness of breath wheezing and cough snoring, heartburn constipation, frequency.  Tingling nervousness, anxiety, depression.  Easy bruising excessive thirst.  All other systems were reviewed and were normal.  Past Medical History  Diagnosis Date  . Hypertension   . Allergic rhinitis   . Anxiety   . Depression   . Headaches, cluster   . GERD (gastroesophageal reflux disease)   . High cholesterol   . Hypothyroid   . Chronic back pain     Past Surgical History  Procedure Date  . Left foot x 2  . Foot surgery   . Partial hysterectomy   . Oophoectomy bilateral  . Cholecystectomy   . Lumbar disc surgery 4-5  . Back surgery     History   Social History  . Marital Status: Legally Separated    Spouse Name: N/A    Number of Children: N/A  . Years of Education: 12th grade   Occupational History  . disabled    Social History Main Topics  . Smoking status: Former Games developer  . Smokeless tobacco: Not on file  . Alcohol Use: Yes     occasionally  . Drug Use: No     has a past history of street drug use  . Sexually Active: Not on file   Other Topics Concern  . Not on file   Social History Narrative  . No narrative on file     BP 84/50  Ht 5\' 3"  (1.6 m)  Wt 227 lb (102.967 kg)  BMI 40.21 kg/m2   This is an obese black female.  Her grooming is normal her hygiene is normal  Her orientation to person place and time are normal  Her mood is normal  Her affect normal.  She's having difficulty with ambulation and limping favoring the RIGHT lower extremity  Neck is supple  Her RIGHT knee has a large contusion large joint effusion medial and lateral joint line tenderness.  The range of motion in her knee is 110.  I cannot assess the stability of her knee because of pain and swelling.  The knee muscle tone is normal.  Skin is intact.  She seemed to have a positive McMurray sign.  Her pulse was intact the temperature of the limb was normal there was no edema and the ankle the swelling is confined to the knee joint.  The lymph nodes were negative the sensation was normal there were no pathologic reflexes.  Her balance could not be assessed.  Upper extremity exam  Inspection and palpation revealed no abnormalities in the upper extremities.  Range of motion is full without contracture.  Motor exam is normal with grade  5 strength.  The joints are fully reduced without subluxation.  There is no atrophy or tremor and muscle tone is normal.  All joints are stable.   LEFT Lower extremity exam   Inspection and palpation revealed no tenderness or abnormality in alignment in the lower extremities. Range of motion is full.  Strength is grade 5.   All joints are stable.  The x-ray I reviewed was from the hospital it was debrided January 31 it showed mild degenerative changes medial compartment mild narrowing with spurring of the tibial spine.  Impression sprain RIGHT knee  Recommended MRI right knee

## 2011-11-02 NOTE — Patient Instructions (Addendum)
You have been scheduled for an MRI scan.  Your insurance company requires advocate precertification prior to scheduling the MRI.  If her MRI scan is not improved we will let you know and make further treatment recommendations according to your insurance's guidelines.   We will call you with the results and further treatment plans

## 2011-11-07 ENCOUNTER — Telehealth: Payer: Self-pay | Admitting: Radiology

## 2011-11-07 NOTE — Telephone Encounter (Signed)
I called to give the patient her MRI appointment at [facility] on 11-14-11 at 5:30. Patient has [employer], authorization [phone] and it expires on 12-04-11. Dr. Romeo Apple will call the patient with her results.

## 2011-11-10 ENCOUNTER — Encounter (HOSPITAL_COMMUNITY): Payer: Self-pay | Admitting: Psychology

## 2011-11-10 ENCOUNTER — Ambulatory Visit (INDEPENDENT_AMBULATORY_CARE_PROVIDER_SITE_OTHER): Payer: Medicare HMO | Admitting: Psychology

## 2011-11-10 DIAGNOSIS — F39 Unspecified mood [affective] disorder: Secondary | ICD-10-CM

## 2011-11-10 DIAGNOSIS — F1010 Alcohol abuse, uncomplicated: Secondary | ICD-10-CM

## 2011-11-10 NOTE — Progress Notes (Signed)
Patient:  Diane [name]   DOB: [birth year]  MR Number: [id number]  Location: [facility] [address] [place] [address] Dept: [phone]  Start: 10:30 AM End: 11:30 AM  Provider/Observer:     Hershal Coria PSYD  Chief Complaint:      Chief Complaint  Patient presents with  . Depression  . Alcohol Problem    Reason For Service:     the patient initially came in because of issues of increasing symptoms of depression, anxiety and mood swings. She was followed often on for the last four years there are office and continues to see Dr. Lolly Mustache for psychiatric interventions. The patient continues to have a lot of coping issues and depression as well as other mood swings but denies suicidal ideation.  More recently, a very close friend of hers in fact the only close friend she has passed away at the patient has been coping very poorly with this and essentially has begun to isolate herself again and restart regular alcohol consumption.  Interventions Strategy:  Cognitive/behavioral psychotherapeutic interventions  Participation Level:   Active  Participation Quality:  Appropriate      Behavioral Observation:  Well Groomed, Alert, and Depressed.   Current Psychosocial Factors: The patient's close friend and potentially only friend outside of her family passed away in October 21, 2022 for what sounds like a congestive heart failure type of condition. The patient had become very close to her and the loss of his friend has been very difficult for her to manage. Significant buried minute and the development of significant depression have been current. The patient is also began drinking more alcohol recently and this has been exacerbating these issues.  Content of Session:   Review current symptoms and continue to work on therapeutic interventions to build better coping skills and strategies.  Current  Status:   The patient has been describing significant increase of depression and isolation as well as significant increase of alcohol consumption.  Patient Progress:   The patient had been doing fairly well in months past but more recently she lost a very close friend and her alcohol use and depression have worsened.  Target Goals:   Target goals include dealing with significant issues around a mood disorder that predominately is a depressive in nature but also has times of hypomanic episodes. Working on Pharmacologist to facilitate mood stability are primary. More recently, buried in issues and other depression have also been present and these may have a worsening effect on her underlying psychiatric condition.  Last Reviewed:   11/10/2011  Goals Addressed Today:    Goals addressed today primarily has to do with bereavement issues and depression secondary to loss of her friend who died in 2022/10/21.  Impression/Diagnosis:   the patient had a long history of mood disorder including mood swings into stages of depression. She also has used alcohol as a therapeutic intervention.  Diagnosis:    Axis I:  1. Mood disorder   2. Alcohol abuse         Axis II: No diagnosis

## 2011-11-14 ENCOUNTER — Other Ambulatory Visit: Payer: Medicare HMO

## 2011-11-21 ENCOUNTER — Ambulatory Visit
Admission: RE | Admit: 2011-11-21 | Discharge: 2011-11-21 | Disposition: A | Discharge status: Home / Self Care | Payer: Medicare HMO | Source: Ambulatory Visit | Attending: Orthopedic Surgery | Admitting: Orthopedic Surgery

## 2011-11-21 DIAGNOSIS — S8391XA Sprain of unspecified site of right knee, initial encounter: Secondary | ICD-10-CM

## 2011-11-23 ENCOUNTER — Telehealth: Payer: Self-pay | Admitting: Orthopedic Surgery

## 2011-11-23 ENCOUNTER — Ambulatory Visit (INDEPENDENT_AMBULATORY_CARE_PROVIDER_SITE_OTHER): Payer: Medicare HMO | Admitting: Psychiatry

## 2011-11-23 ENCOUNTER — Encounter (HOSPITAL_COMMUNITY): Payer: Self-pay | Admitting: Psychiatry

## 2011-11-23 VITALS — Wt 228.0 lb

## 2011-11-23 DIAGNOSIS — F39 Unspecified mood [affective] disorder: Secondary | ICD-10-CM

## 2011-11-23 DIAGNOSIS — Z79899 Other long term (current) drug therapy: Secondary | ICD-10-CM

## 2011-11-23 MED ORDER — GABAPENTIN 100 MG PO CAPS: 100.0000 mg | ORAL_CAPSULE | Freq: Three times a day (TID) | ORAL | Status: DC

## 2011-11-23 MED ORDER — AMITRIPTYLINE HCL 50 MG PO TABS: 50.0000 mg | ORAL_TABLET | Freq: Every day | ORAL | Status: DC

## 2011-11-23 MED ORDER — DIVALPROEX SODIUM 250 MG PO DR TAB: DELAYED_RELEASE_TABLET | ORAL | Status: DC

## 2011-11-23 NOTE — Progress Notes (Signed)
Chief complaint Medication management  History of present illness Patient is 61 year old [place] American female who came for her followup appointment. She has been compliant with her medication and reported no side effects. She complain of insomnia and recently she tried taking more amitriptyline but also feel very sedated next day. She continues to have residual anxiety, mood swing and nervousness. She continues to drink however it has been significantly cut down from the past. She denies any recent fall or dizziness. She denies any racing thoughts, crying spells or any psychotic episode. She denies any tremors or shakes. She admitted getting easily irritable and frustrated.  Current psychiatric medication Depakote 500 mg at bedtime Amitriptyline 50 mg at bedtime Neurontin 100 mg 3 times a day She is getting Xanax from her primary care physician.  Past psychiatric history Patient has history of one [facility] She took overdose on her pills she was also intoxicated with alcohol. Patient has also seen by psychiatrist in 2004. At that time she was getting more great amount of benzodiazepine pain medication.  Family history Patient endorsed significant history of alcoholism in the family.  Medical history Patient has history of hypertension, allergic rhinitis, hypothyroidism, chronic back pain, hyperlipidemia and obesity.  Psychosocial history Patient has been married for 16 years. She has 2 children. She lives with her husband however her relationship is very tense. She has history of leaving her husband in the past.  Mental status examination Patient is mildly obese female who is casually dressed and fairly groomed. She is cooperative and maintained fair eye contact. She is somewhat guarded about her drinking but overall relevant and pleasant in conversation. Her speech is clear and coherent. Her attention and concentration is fair. She  denies any active or passive suicidal thinking and homicidal thinking. She's alert and oriented x3. There are no psychotic symptoms present at this time. She denies any auditory or visual hallucination. Her insight judgment is fair and her impulse control is okay.  Assessment Axis I bipolar disorder NOS, benzodiazepine abuse, alcohol abuse Axis II deferred Axis III see medical history Axis IV moderate Axis V 60-65  Plan I review her history, collateral information, last progress note, medication and recent blood work. Patient continued to endorse poor sleep and some residual mood lability. She has been taking Depakote however recently there has been no blood level for Depakote. I recommended to try Depakote 750 mg at bedtime to target those symptoms. She will continue amitriptyline and Neurontin her present dose. One more time I explained about risk of taking benzodiazepine but psychotropic medication. Patient has been trying to reduce her alcohol intake. She is poorly compliant with her therapist. We will do Depakote level, I recommended to call us if she has any question or concern about the medication if she feels worsening of the symptoms. I will see her again in 4 weeks. Time spent 30 minutes

## 2011-11-23 NOTE — Telephone Encounter (Signed)
Patient called to request her MRI results of right knee, which she had done 11/21/11 at [facility].  Her last office note indicates "call patient with results."  Please call at ph# [phone].

## 2011-11-24 ENCOUNTER — Ambulatory Visit (HOSPITAL_COMMUNITY): Payer: Medicare HMO | Admitting: Psychology

## 2011-11-24 ENCOUNTER — Ambulatory Visit (INDEPENDENT_AMBULATORY_CARE_PROVIDER_SITE_OTHER): Payer: Medicare HMO | Admitting: Psychology

## 2011-11-24 DIAGNOSIS — F1010 Alcohol abuse, uncomplicated: Secondary | ICD-10-CM

## 2011-11-24 DIAGNOSIS — F39 Unspecified mood [affective] disorder: Secondary | ICD-10-CM

## 2011-11-27 NOTE — Telephone Encounter (Signed)
Results are reviewed with patient.  Patient will need arthroscopy. Patient is speaking with her daughter will call his back regarding a date

## 2011-11-28 ENCOUNTER — Other Ambulatory Visit: Payer: Self-pay | Admitting: Orthopedic Surgery

## 2011-11-28 ENCOUNTER — Encounter (HOSPITAL_COMMUNITY): Payer: Self-pay | Admitting: Psychology

## 2011-11-28 NOTE — Progress Notes (Signed)
Patient:  Diane [name]   DOB: 12-29-1950  MR Number: [id number]  Location: BEHAVIORAL [facility] [address] [place] [address] Dept: [phone]  Start: 11 AM End: 12 p.m.  Provider/Observer:     Hershal Coria PSYD  Chief Complaint:      Chief Complaint  Patient presents with  . Depression  . Alcohol Problem    Reason For Service:     the patient initially came in because of issues of increasing symptoms of depression, anxiety and mood swings. She was followed often on for the last four years there are office and continues to see Dr. Lolly Mustache for psychiatric interventions. The patient continues to have a lot of coping issues and depression as well as other mood swings but denies suicidal ideation.  More recently, a very close friend of hers in fact the only close friend she has passed away at the patient has been coping very poorly with this and essentially has begun to isolate herself again and restart regular alcohol consumption.  Interventions Strategy:  Cognitive/behavioral psychotherapeutic interventions  Participation Level:   Active  Participation Quality:  Appropriate      Behavioral Observation:  Well Groomed, Alert, and Depressed.   Current Psychosocial Factors: The patient reports that she has cut down her alcohol use considerably since her last visit. She reports that she is really been working on some of the coping skills and strategies we have developed regarding her frustrations and when she is dealing with interpersonal situations that are very stressful to her..  Content of Session:   Review current symptoms and continue to work on therapeutic interventions to build better coping skills and strategies.  Current Status:   The patient has been describing significant increase of depression and isolation as well as significant increase of alcohol consumption.  Patient  Progress:   The patient had been doing fairly well in months past but more recently she lost a very close friend and her alcohol use and depression have worsened.  Target Goals:   Target goals include dealing with significant issues around a mood disorder that predominately is a depressive in nature but also has times of hypomanic episodes. Working on Pharmacologist to facilitate mood stability are primary. More recently, buried in issues and other depression have also been present and these may have a worsening effect on her underlying psychiatric condition.  Last Reviewed:   11/24/2011  Goals Addressed Today:    We worked on building and continuing to develop coping skills around her underlying mood disorder as well as her recurrent use of alcohol which exacerbates her symptoms..  Impression/Diagnosis:   the patient had a long history of mood disorder including mood swings into stages of depression. She also has used alcohol as a therapeutic intervention.  Diagnosis:    Axis I:  1. Mood disorder   2. Alcohol abuse         Axis II: No diagnosis

## 2011-11-28 NOTE — Telephone Encounter (Signed)
Patient called back today to ask if Dr. Romeo Apple would prescribe something for pain until she is able to schedule surgery.  Pharmacy is [facility] in [place]. Patient's ph# is [phone].

## 2011-11-29 ENCOUNTER — Telehealth: Payer: Self-pay | Admitting: Orthopedic Surgery

## 2011-11-29 NOTE — Telephone Encounter (Signed)
norco 5 mg q 4  Prn pain # 60

## 2011-11-29 NOTE — Telephone Encounter (Signed)
Med sent.

## 2011-11-29 NOTE — Telephone Encounter (Signed)
Notified patient.

## 2011-11-29 NOTE — Telephone Encounter (Signed)
Call back from patient asking if her prescription refill request was received from [facility] in [place].  She had also called yesterday, 11/28/11, inquiring about pain medication refill.  She had spoken with Dr. Romeo Apple prior to this request about getting scheduled for surgery when she can make arrangements to do so.  Her ph# is [phone].

## 2011-12-01 ENCOUNTER — Other Ambulatory Visit: Payer: Self-pay | Admitting: *Deleted

## 2011-12-01 NOTE — Telephone Encounter (Signed)
This script was called in but cancelled, patient picked up #120 vicodin 5/500 from another provider

## 2011-12-26 ENCOUNTER — Telehealth: Payer: Self-pay | Admitting: Orthopedic Surgery

## 2011-12-26 NOTE — Telephone Encounter (Signed)
Patient has called back about right knee, and states that the pain is in the back of right knee and hurting down calf as well.  She has been evaluated for this problem and has had MRI.  Said that she is not ready for surgery.  Asking for another appointment.  Please advise.  Patietnt's phone # is [phone].

## 2011-12-27 NOTE — Telephone Encounter (Signed)
I called back to patient, and patient relates that she would like to move forward and schedule the knee surgery for either the end of April or in early May.  Please contact patient with pre-op and surgery information.

## 2011-12-27 NOTE — Telephone Encounter (Signed)
i recommend surgery for her meniscus tear otherwise i dont need to re evaluate unless these are new symptoms

## 2011-12-28 ENCOUNTER — Ambulatory Visit (INDEPENDENT_AMBULATORY_CARE_PROVIDER_SITE_OTHER): Payer: Medicare HMO | Admitting: Psychiatry

## 2011-12-28 ENCOUNTER — Encounter (HOSPITAL_COMMUNITY): Payer: Self-pay | Admitting: Psychiatry

## 2011-12-28 ENCOUNTER — Other Ambulatory Visit (HOSPITAL_COMMUNITY): Payer: Self-pay | Admitting: Psychiatry

## 2011-12-28 VITALS — Wt 232.6 lb

## 2011-12-28 DIAGNOSIS — F39 Unspecified mood [affective] disorder: Secondary | ICD-10-CM

## 2011-12-28 MED ORDER — GABAPENTIN 300 MG PO CAPS: 300.0000 mg | ORAL_CAPSULE | Freq: Three times a day (TID) | ORAL | Status: DC

## 2011-12-28 MED ORDER — DIVALPROEX SODIUM 250 MG PO DR TAB: DELAYED_RELEASE_TABLET | ORAL | Status: DC

## 2011-12-28 NOTE — Progress Notes (Signed)
Chief complaint Medication management  History of present illness Patient is 61 year old [place] American female who came for her followup appointment.  She is taking Depakote 750 mg at bedtime however she continues to have poor sleep .  She complained of pain which gotten worse in past few weeks.  She's unable to sleep due to pain.  She was unable to do blood work as she was visiting her daughter in [place].  Patient is not drinking every day as her daughter does not drink .  Patient is waiting for her surgery date which could be next week .  She requires right knee surgery .  Patient endorse that her pain is worse and she can not walk due to pain .  She is compliant with her medication.  She denies any agitation anger or mood swings.  She described her energy level low but she likes increase Depakote .  Her anger is much control with increase Depakote .  She denies any tremors or shakes.  She denies any recent fall or dizziness.  I reviewed her medication .  She is taking Neurontin 100 mg which was reduced from 300 mg .  Patient has any active or passive suicidal thinking.  She still takes Xanax only as needed prescribed by primary care physician.  Current psychiatric medication Depakote 750 mg at bedtime Amitriptyline 50 mg at bedtime Neurontin 100 mg 3 times a day She is getting Xanax from her primary care physician.  Past psychiatric history Patient has history of one [facility]. She took overdose on her pills she was also intoxicated with alcohol. Patient has also seen by psychiatrist in 2004. At that time she was getting more great amount of benzodiazepine pain medication.  Family history Patient endorsed significant history of alcoholism in the family.  Medical history Patient has history of hypertension, allergic rhinitis, hypothyroidism, chronic back pain, hyperlipidemia and obesity.  She is waiting for her right knee  surgery.  Psychosocial history Patient has been married for 16 years. She has 2 children. She is married however her relationship is very tense. She has history of leaving her husband in the past.  Currently she lives by herself.  Mental status examination Patient is mildly obese female who is casually dressed and fairly groomed.  She complained of pain but she is cooperative and maintained fair eye contact. She is somewhat guarded about her drinking but overall relevant and pleasant in conversation. Her speech is clear and coherent. Her attention and concentration is fair. She denies any active or passive suicidal thinking and homicidal thinking. She's alert and oriented x3. There are no psychotic symptoms present at this time. She denies any auditory or visual hallucination. Her insight judgment is fair and her impulse control is okay.  Assessment Axis I bipolar disorder NOS, benzodiazepine abuse, alcohol abuse Axis II deferred Axis III see medical history Axis IV moderate Axis V 60-65  Plan I reinforced blood work.  I will continue Depakote 750 mg at bedtime .  Patient reported some improvement in her anger with increased dose.  I will also increase her Neurontin to 300 mg 3 times a day which she was taking in the past.  She will continue amitriptyline at present dose.  One more time I explained about risk of taking psychotropic medication with alcohol. Patient has been trying to reduce her alcohol intake.  I encourage her to see therapist regularly.  I recommended to call us if she  has any question or concern about the medication if she feels worsening of the symptoms. I will see her again in 4 weeks.

## 2012-01-01 ENCOUNTER — Ambulatory Visit (INDEPENDENT_AMBULATORY_CARE_PROVIDER_SITE_OTHER): Payer: Medicare HMO | Admitting: Psychology

## 2012-01-01 DIAGNOSIS — F1010 Alcohol abuse, uncomplicated: Secondary | ICD-10-CM

## 2012-01-01 DIAGNOSIS — F39 Unspecified mood [affective] disorder: Secondary | ICD-10-CM

## 2012-01-01 NOTE — Telephone Encounter (Signed)
Discuss with me Tuesday

## 2012-01-01 NOTE — Telephone Encounter (Signed)
Will this be an arthroscopy with medial menisectomy or meniscus repair?

## 2012-01-03 ENCOUNTER — Encounter (HOSPITAL_COMMUNITY): Payer: Self-pay | Admitting: Psychology

## 2012-01-03 NOTE — Progress Notes (Signed)
Patient:  Diane [name]   DOB: [birth year]  MR Number: [id number]  Location: BEHAVIORAL [facility] [address] [place] [address] Dept: [phone]  Start: 11 AM End: 12 p.m.  Provider/Observer:     Hershal Coria PSYD  Chief Complaint:      Chief Complaint  Patient presents with  . Anxiety  . Agitation  . Depression    Reason For Service:     the patient initially came in because of issues of increasing symptoms of depression, anxiety and mood swings. She was followed often on for the last four years there are office and continues to see Dr. Lolly Mustache for psychiatric interventions. The patient continues to have a lot of coping issues and depression as well as other mood swings but denies suicidal ideation.  More recently, a very close friend of hers in fact the only close friend she has passed away at the patient has been coping very poorly with this and essentially has begun to isolate herself again and restart regular alcohol consumption.  Interventions Strategy:  Cognitive/behavioral psychotherapeutic interventions  Participation Level:   Active  Participation Quality:  Appropriate      Behavioral Observation:  Well Groomed, Alert, and Depressed.   Current Psychosocial Factors: The patient reports that she has cut down her alcohol use considerably since her last visit. She reports that she is really been working on some of the coping skills and strategies we have developed regarding her frustrations and when she is dealing with interpersonal situations that are very stressful to her..  Content of Session:   Review current symptoms and continue to work on therapeutic interventions to build better coping skills and strategies.  Current Status:   The patient has been able to significantly reduce her alcohol use and has noted improvement as a result of this. She has continued to work on Pharmacologist  and strategies we have developed overall and continues to be motivated to improve her overall status..  Patient Progress:   The patient had been doing fairly well in months past but more recently she lost a very close friend and her alcohol use and depression have worsened.  Target Goals:   Target goals include dealing with significant issues around a mood disorder that predominately is a depressive in nature but also has times of hypomanic episodes. Working on Pharmacologist to facilitate mood stability are primary. More recently, buried in issues and other depression have also been present and these may have a worsening effect on her underlying psychiatric condition.  Last Reviewed:   01/02/2012  Goals Addressed Today:    We worked on building and continuing to develop coping skills around her underlying mood disorder as well as her recurrent use of alcohol which exacerbates her symptoms.  The patient has substantially reduced any alcohol use and this has helped her with some of the agitation she has been experiencing and she has noted this improvement..  Impression/Diagnosis:   the patient had a long history of mood disorder including mood swings into stages of depression. She also has used alcohol as a therapeutic intervention.  Diagnosis:    Axis I:  1. Mood disorder   2. Alcohol abuse         Axis II: No diagnosis

## 2012-01-03 NOTE — Telephone Encounter (Signed)
Patient has called back to inquire about when surgery is scheduled.

## 2012-01-05 ENCOUNTER — Other Ambulatory Visit: Payer: Self-pay | Admitting: *Deleted

## 2012-01-05 NOTE — Telephone Encounter (Signed)
Patient aware of surgery and pre op

## 2012-01-05 NOTE — Telephone Encounter (Signed)
Surgery scheduled for Jan 12 2012 @ 7:30am Pre op Jan 10 2012 @ 8am @ [facility] short stay  Called patient, left voicemail to return call

## 2012-01-08 NOTE — Telephone Encounter (Signed)
Called patient and scheduled post op appointment for 01/15/12, 2:45pm

## 2012-01-09 ENCOUNTER — Encounter (HOSPITAL_COMMUNITY): Payer: Self-pay | Admitting: Pharmacy Technician

## 2012-01-10 ENCOUNTER — Encounter (HOSPITAL_COMMUNITY)
Admission: RE | Admit: 2012-01-10 | Discharge: 2012-01-10 | Disposition: A | Discharge status: Home / Self Care | Payer: Medicare HMO | Source: Ambulatory Visit | Attending: Orthopedic Surgery | Admitting: Orthopedic Surgery

## 2012-01-10 ENCOUNTER — Encounter (HOSPITAL_COMMUNITY): Payer: Self-pay

## 2012-01-10 ENCOUNTER — Other Ambulatory Visit: Payer: Self-pay

## 2012-01-10 HISTORY — PX: KNEE ARTHROSCOPY: SHX127

## 2012-01-10 HISTORY — DX: Sleep apnea, unspecified: G47.30

## 2012-01-10 HISTORY — DX: Unspecified cataract: H26.9

## 2012-01-10 HISTORY — DX: Unspecified osteoarthritis, unspecified site: M19.90

## 2012-01-10 HISTORY — DX: Unspecified convulsions: R56.9

## 2012-01-10 LAB — SURGICAL PCR SCREEN
MRSA, PCR: POSITIVE — AB
Staphylococcus aureus: POSITIVE — AB

## 2012-01-10 LAB — HEMOGLOBIN AND HEMATOCRIT, BLOOD
HCT: 40.7 % (ref 36.0–46.0)
Hemoglobin: 12.9 g/dL (ref 12.0–15.0)

## 2012-01-10 LAB — BASIC METABOLIC PANEL
BUN: 14 mg/dL (ref 6–23)
CO2: 30 mEq/L (ref 19–32)
Calcium: 9 mg/dL (ref 8.4–10.5)
Chloride: 100 mEq/L (ref 96–112)
Creatinine, Ser: 0.7 mg/dL (ref 0.50–1.10)
GFR calc Af Amer: >90 mL/min (ref >90)
GFR calc non Af Amer: >90 mL/min (ref >90)
Glucose, Bld: 104 mg/dL — ABNORMAL HIGH (ref 70–99)
Potassium: 3.7 mEq/L (ref 3.5–5.1)
Sodium: 140 mEq/L (ref 135–145)

## 2012-01-10 NOTE — Patient Instructions (Signed)
20 Diane [name]  01/10/2012   Your procedure is scheduled on:  Friday, 01/12/12  Report to [facility] at [place] [facility].  Call this number if you have problems the morning of surgery: [phone]   Remember:   Do not eat food:After Midnight.  May have clear liquids:until Midnight .  Clear liquids include soda, tea, black coffee, apple or grape juice, broth.  Take these medicines the morning of surgery with A SIP OF WATER: clonidine, levothyroxine, celexa, xanax   Do not wear jewelry, make-up or nail polish.  Do not wear lotions, powders, or perfumes. You may wear deodorant.  Do not shave 48 hours prior to surgery.  Do not bring valuables to the hospital.  Contacts, dentures or bridgework may not be worn into surgery.  Leave suitcase in the car. After surgery it may be brought to your room.  For patients admitted to the hospital, checkout time is 11:00 AM the day of discharge.   Patients discharged the day of surgery will not be allowed to drive home.  Name and phone number of your driver: driver  Special Instructions: CHG Shower Use Special Wash: 1/2 bottle night before surgery and 1/2 bottle morning of surgery.   Please read over the following fact sheets that you were given: Pain Booklet, MRSA Information, Surgical Site Infection Prevention, Anesthesia Post-op Instructions and Care and Recovery After Surgery   Arthroscopic Procedure, Knee Care After Refer to this sheet in the next few weeks. These discharge instructions provide you with general information on caring for yourself after you leave the hospital. Your caregiver may also give you specific instructions. Your treatment has been planned according to the most current medical practices available, but unavoidable complications sometimes occur. If you have any problems or questions after discharge, please call your caregiver. HOME CARE INSTRUCTIONS  It will be normal to be sore for a couple days after surgery. See your caregiver if this  seems to be getting worse rather than better. Only take over-the-counter or prescription medicines for pain, discomfort, or fever as directed by your caregiver.  Take showers rather than baths, or as directed by your caregiver.   Change bandages (dressings) if necessary or as directed.   You may resume normal diet and activities as directed or allowed.   Avoid lifting or driving until you are directed otherwise.   Make an appointment to see your caregiver for stitches (suture) or staple removal as directed.   You may put ice on the area.   Put ice in a plastic bag.   Place a towel between your skin and the bag.   Leave the ice on for 15 to 20 minutes, 3 to 4 times per day for the first 2 days.  SEEK MEDICAL CARE IF:   You have increased bleeding from your wounds.   You see redness, swelling, or have increasing pain in your wounds.   You have pus coming from your wound.   You have an oral temperature above 102 F (38.9 C).   You notice a bad smell coming from the wound or dressing.   You have severe pain with any motion of your knee.  SEEK IMMEDIATE MEDICAL CARE IF:   You develop a rash.   You have difficulty breathing   You develop any reaction or side effects to medicines taken.  MAKE SURE YOU:   Understand these instructions.   Will watch your condition.   Will get help right away if you are not doing  well or get worse.  Document Released: 03/17/2005 Document Revised: 08/17/2011 Document Reviewed: 11/21/2007 [facility] Patient Information 2012 [place], [place].

## 2012-01-10 NOTE — Progress Notes (Signed)
01/10/12 1146  OBSTRUCTIVE SLEEP APNEA  Have you ever been diagnosed with sleep apnea through a sleep study? No  Do you snore loudly (loud enough to be heard through closed doors)?  0  Do you often feel tired, fatigued, or sleepy during the daytime? 1  Has anyone observed you stop breathing during your sleep? 0  Do you have, or are you being treated for high blood pressure? 1  BMI more than 35 kg/m2? 1  Age over 62 years old? 1  Neck circumference greater than 40 cm/18 inches? 0  Gender: 0  Obstructive Sleep Apnea Score 4   Score 4 or greater  Updated health history

## 2012-01-11 ENCOUNTER — Telehealth: Payer: Self-pay | Admitting: Orthopedic Surgery

## 2012-01-11 NOTE — Telephone Encounter (Signed)
Per automated voice response system, no pre-authorization required for these codes.

## 2012-01-11 NOTE — Telephone Encounter (Signed)
Contacted insurer, Medicaid's 3rd party, [employer] [phone] re: out-patient surgery scheduled 01/12/12 at [facility] [place], [place] [address], [phone].

## 2012-01-11 NOTE — H&P (Signed)
Chief Complaint   Patient presents with   .  Knee Pain     bilateral knee pain since last year, gradual onset, no known injury   This patient actually complains of recent onset of a fall and increasing RIGHT knee pain with underlying known chronic pain in her knee RIGHT and LEFT. She received an injection in the RIGHT knee in November 2012 and again in February of 2013 no relief she's been on hematomas recently no relief she had a recent fall and since that time the pain is worse in the swelling is worse and she complains of 9/10 sharp burning constant pain associated with bruising catching and swelling which is worse when she is trying to walk  Review of systems blurred vision shortness of breath wheezing and cough snoring, heartburn constipation, frequency. Tingling nervousness, anxiety, depression. Easy bruising excessive thirst. All other systems were reviewed and were normal.  Past Medical History   Diagnosis  Date   .  Hypertension    .  Allergic rhinitis    .  Anxiety    .  Depression    .  Headaches, cluster    .  GERD (gastroesophageal reflux disease)    .  High cholesterol    .  Hypothyroid    .  Chronic back pain     Past Surgical History   Procedure  Date   .  Left foot  x 2   .  Foot surgery    .  Partial hysterectomy    .  Oophoectomy  bilateral   .  Cholecystectomy    .  Lumbar disc surgery  4-5   .  Back surgery     History    Social History   .  Marital Status:  Legally Separated     Spouse Name:  N/A     Number of Children:  N/A   .  Years of Education:  12th grade    Occupational History   .  disabled     Social History Main Topics   .  Smoking status:  Former Games developer   .  Smokeless tobacco:  Not on file   .  Alcohol Use:  Yes      occasionally   .  Drug Use:  No      has a past history of street drug use   .  Sexually Active:  Not on file    Other Topics  Concern   .  Not on file    Social History Narrative   .  No narrative on file   BP 84/50   Ht 5\' 3"  (1.6 m)  Wt 227 lb (102.967 kg)  BMI 40.21 kg/m2   This is an obese black female. Her grooming is normal her hygiene is normal   Her orientation to person place and time are normal  Her mood is normal  Her affect normal.   She's having difficulty with ambulation and limping favoring the RIGHT lower extremity   Neck is supple   Her RIGHT knee has a large contusion large joint effusion medial and lateral joint line tenderness. The range of motion in her knee is 110. I cannot assess the stability of her knee because of pain and swelling. The knee muscle tone is normal. Skin is intact. She seemed to have a positive McMurray sign. Her pulse was intact the temperature of the limb was normal there was no edema and the ankle the swelling is confined  to the knee joint. The lymph nodes were negative the sensation was normal there were no pathologic reflexes. Her balance could not be assessed.   Upper extremity exam  Inspection and palpation revealed no abnormalities in the upper extremities. Range of motion is full without contracture.  Motor exam is normal with grade 5 strength.  The joints are fully reduced without subluxation.  There is no atrophy or tremor and muscle tone is normal. All joints are stable.   LEFT Lower extremity exam  Inspection and palpation revealed no tenderness or abnormality in alignment in the lower extremities.  Range of motion is full. Strength is grade 5.  All joints are stable.  MRI IMPRESSION:  1. Deep radial tear involving the posterior horn of the medial  meniscus.  2. Intact ligamentous structures and no acute bony findings.  3. Minimal degenerative changes for age. Mild degenerative  chondrosis in the medial compartment.  4. Moderate-to-large joint effusion and small to moderate-sized  Baker's cyst.  Diagnosis torn medial meniscus right knee    Plan arthroscopy partial  medial meniscectomy right knee

## 2012-01-12 ENCOUNTER — Encounter (HOSPITAL_COMMUNITY)
Admission: RE | Disposition: A | Discharge status: Home / Self Care | Payer: Self-pay | Source: Ambulatory Visit | Attending: Orthopedic Surgery

## 2012-01-12 ENCOUNTER — Encounter (HOSPITAL_COMMUNITY): Payer: Self-pay | Admitting: Anesthesiology

## 2012-01-12 ENCOUNTER — Ambulatory Visit (HOSPITAL_COMMUNITY)
Admission: RE | Admit: 2012-01-12 | Discharge: 2012-01-12 | Disposition: A | Discharge status: Home / Self Care | Payer: Medicare HMO | Source: Ambulatory Visit | Attending: Orthopedic Surgery | Admitting: Orthopedic Surgery

## 2012-01-12 ENCOUNTER — Encounter (HOSPITAL_COMMUNITY): Payer: Self-pay | Admitting: *Deleted

## 2012-01-12 ENCOUNTER — Ambulatory Visit (HOSPITAL_COMMUNITY): Payer: Medicare HMO | Admitting: Anesthesiology

## 2012-01-12 DIAGNOSIS — Z79899 Other long term (current) drug therapy: Secondary | ICD-10-CM | POA: Insufficient documentation

## 2012-01-12 DIAGNOSIS — M23305 Other meniscus derangements, unspecified medial meniscus, unspecified knee: Secondary | ICD-10-CM

## 2012-01-12 DIAGNOSIS — M1710 Unilateral primary osteoarthritis, unspecified knee: Secondary | ICD-10-CM | POA: Diagnosis present

## 2012-01-12 DIAGNOSIS — M179 Osteoarthritis of knee, unspecified: Secondary | ICD-10-CM | POA: Diagnosis present

## 2012-01-12 DIAGNOSIS — M23329 Other meniscus derangements, posterior horn of medial meniscus, unspecified knee: Secondary | ICD-10-CM | POA: Insufficient documentation

## 2012-01-12 DIAGNOSIS — I10 Essential (primary) hypertension: Secondary | ICD-10-CM | POA: Insufficient documentation

## 2012-01-12 DIAGNOSIS — M23203 Derangement of unspecified medial meniscus due to old tear or injury, right knee: Secondary | ICD-10-CM | POA: Diagnosis present

## 2012-01-12 DIAGNOSIS — Z01810 Encounter for preprocedural cardiovascular examination: Secondary | ICD-10-CM | POA: Insufficient documentation

## 2012-01-12 DIAGNOSIS — IMO0002 Reserved for concepts with insufficient information to code with codable children: Secondary | ICD-10-CM | POA: Insufficient documentation

## 2012-01-12 DIAGNOSIS — E7800 Pure hypercholesterolemia, unspecified: Secondary | ICD-10-CM | POA: Insufficient documentation

## 2012-01-12 SURGERY — ARTHROSCOPY, KNEE, WITH MEDIAL MENISCECTOMY
Anesthesia: General | Laterality: Right | Wound class: Clean

## 2012-01-12 MED ORDER — MIDAZOLAM HCL 2 MG/2ML IJ SOLN
1.0000 mg | INTRAMUSCULAR | Status: DC | PRN
  Administered 2012-01-12: 1 mg via INTRAVENOUS

## 2012-01-12 MED ORDER — FENTANYL CITRATE 0.05 MG/ML IJ SOLN
INTRAMUSCULAR | Status: AC
  Filled 2012-01-12: qty 2

## 2012-01-12 MED ORDER — BUPIVACAINE-EPINEPHRINE PF 0.5-1:200000 % IJ SOLN
INTRAMUSCULAR | Status: DC | PRN
  Administered 2012-01-12: 60 mL

## 2012-01-12 MED ORDER — GLYCOPYRROLATE 0.2 MG/ML IJ SOLN
INTRAMUSCULAR | Status: AC
  Administered 2012-01-12: 0.2 mg via INTRAVENOUS
  Filled 2012-01-12: qty 1

## 2012-01-12 MED ORDER — GLYCOPYRROLATE 0.2 MG/ML IJ SOLN
INTRAMUSCULAR | Status: DC | PRN
  Administered 2012-01-12: 0.4 mg via INTRAVENOUS

## 2012-01-12 MED ORDER — ACETAMINOPHEN 325 MG PO TABS: 325.0000 mg | ORAL_TABLET | ORAL | Status: DC | PRN

## 2012-01-12 MED ORDER — EPINEPHRINE HCL 1 MG/ML IJ SOLN
INTRAMUSCULAR | Status: AC
  Filled 2012-01-12: qty 5

## 2012-01-12 MED ORDER — MIDAZOLAM HCL 2 MG/2ML IJ SOLN
INTRAMUSCULAR | Status: AC
  Administered 2012-01-12: 1 mg via INTRAVENOUS
  Filled 2012-01-12: qty 2

## 2012-01-12 MED ORDER — TRAMADOL HCL 50 MG PO TABS
ORAL_TABLET | ORAL | Status: AC
  Administered 2012-01-12: 50 mg via ORAL
  Filled 2012-01-12: qty 1

## 2012-01-12 MED ORDER — HYDROCODONE-ACETAMINOPHEN 7.5-325 MG PO TABS: 1.0000 | ORAL_TABLET | ORAL | Status: AC | PRN

## 2012-01-12 MED ORDER — ACETAMINOPHEN 10 MG/ML IV SOLN
INTRAVENOUS | Status: AC
  Administered 2012-01-12: 1000 mg via INTRAVENOUS
  Filled 2012-01-12: qty 100

## 2012-01-12 MED ORDER — LIDOCAINE HCL (PF) 1 % IJ SOLN
INTRAMUSCULAR | Status: AC
  Filled 2012-01-12: qty 5

## 2012-01-12 MED ORDER — TRAMADOL HCL 50 MG PO TABS
50.0000 mg | ORAL_TABLET | Freq: Once | ORAL | Status: AC
  Administered 2012-01-12: 50 mg via ORAL

## 2012-01-12 MED ORDER — SUCCINYLCHOLINE CHLORIDE 20 MG/ML IJ SOLN
INTRAMUSCULAR | Status: DC | PRN
  Administered 2012-01-12: 100 mg via INTRAVENOUS

## 2012-01-12 MED ORDER — KETOROLAC TROMETHAMINE 30 MG/ML IJ SOLN
INTRAMUSCULAR | Status: AC
  Administered 2012-01-12: 30 mg via INTRAVENOUS
  Filled 2012-01-12: qty 1

## 2012-01-12 MED ORDER — LIDOCAINE HCL (CARDIAC) 10 MG/ML IV SOLN
INTRAVENOUS | Status: DC | PRN
  Administered 2012-01-12: 10 mg via INTRAVENOUS

## 2012-01-12 MED ORDER — CHLORHEXIDINE GLUCONATE 4 % EX LIQD
60.0000 mL | Freq: Once | CUTANEOUS | Status: DC
  Filled 2012-01-12: qty 60

## 2012-01-12 MED ORDER — GLYCOPYRROLATE 0.2 MG/ML IJ SOLN
INTRAMUSCULAR | Status: AC
  Filled 2012-01-12: qty 1

## 2012-01-12 MED ORDER — LACTATED RINGERS IV SOLN
INTRAVENOUS | Status: DC
  Administered 2012-01-12: 07:00:00 via INTRAVENOUS

## 2012-01-12 MED ORDER — ACETAMINOPHEN 10 MG/ML IV SOLN
1000.0000 mg | Freq: Once | INTRAVENOUS | Status: AC
  Administered 2012-01-12: 1000 mg via INTRAVENOUS

## 2012-01-12 MED ORDER — ONDANSETRON HCL 4 MG/2ML IJ SOLN
INTRAMUSCULAR | Status: AC
  Administered 2012-01-12: 4 mg via INTRAVENOUS
  Filled 2012-01-12: qty 2

## 2012-01-12 MED ORDER — KETOROLAC TROMETHAMINE 30 MG/ML IJ SOLN
30.0000 mg | Freq: Once | INTRAMUSCULAR | Status: AC
  Administered 2012-01-12: 30 mg via INTRAVENOUS

## 2012-01-12 MED ORDER — FENTANYL CITRATE 0.05 MG/ML IJ SOLN: 25.0000 ug | INTRAMUSCULAR | Status: DC | PRN

## 2012-01-12 MED ORDER — BUPIVACAINE-EPINEPHRINE PF 0.5-1:200000 % IJ SOLN
INTRAMUSCULAR | Status: AC
  Filled 2012-01-12: qty 20

## 2012-01-12 MED ORDER — NEOSTIGMINE METHYLSULFATE 1 MG/ML IJ SOLN
INTRAMUSCULAR | Status: AC
  Filled 2012-01-12: qty 10

## 2012-01-12 MED ORDER — ONDANSETRON HCL 4 MG/2ML IJ SOLN
4.0000 mg | Freq: Once | INTRAMUSCULAR | Status: AC
  Administered 2012-01-12 (×2): 4 mg via INTRAVENOUS

## 2012-01-12 MED ORDER — PROPOFOL 10 MG/ML IV EMUL
INTRAVENOUS | Status: AC
  Filled 2012-01-12: qty 20

## 2012-01-12 MED ORDER — GLYCOPYRROLATE 0.2 MG/ML IJ SOLN
0.2000 mg | Freq: Once | INTRAMUSCULAR | Status: AC
  Administered 2012-01-12: 0.2 mg via INTRAVENOUS

## 2012-01-12 MED ORDER — ONDANSETRON HCL 4 MG/2ML IJ SOLN: 4.0000 mg | Freq: Once | INTRAMUSCULAR | Status: DC | PRN

## 2012-01-12 MED ORDER — PROPOFOL 10 MG/ML IV EMUL
INTRAVENOUS | Status: DC | PRN
  Administered 2012-01-12: 150 mg via INTRAVENOUS
  Administered 2012-01-12: 20 mg via INTRAVENOUS

## 2012-01-12 MED ORDER — SODIUM CHLORIDE 0.9 % IR SOLN
Status: DC | PRN
  Administered 2012-01-12: 1000 mL

## 2012-01-12 MED ORDER — PROMETHAZINE HCL 12.5 MG PO TABS: 12.5000 mg | ORAL_TABLET | Freq: Four times a day (QID) | ORAL | Status: DC | PRN

## 2012-01-12 MED ORDER — VANCOMYCIN HCL 500 MG IV SOLR
INTRAVENOUS | Status: AC
  Filled 2012-01-12: qty 500

## 2012-01-12 MED ORDER — ROCURONIUM BROMIDE 50 MG/5ML IV SOLN
INTRAVENOUS | Status: AC
  Filled 2012-01-12: qty 1

## 2012-01-12 MED ORDER — NEOSTIGMINE METHYLSULFATE 1 MG/ML IJ SOLN
INTRAMUSCULAR | Status: DC | PRN
  Administered 2012-01-12: 2 mg via INTRAVENOUS

## 2012-01-12 MED ORDER — VANCOMYCIN HCL 1000 MG IV SOLR
1500.0000 mg | INTRAVENOUS | Status: AC
  Administered 2012-01-12: 1500 mg via INTRAVENOUS
  Filled 2012-01-12: qty 1500

## 2012-01-12 MED ORDER — ROCURONIUM BROMIDE 100 MG/10ML IV SOLN
INTRAVENOUS | Status: DC | PRN
  Administered 2012-01-12: 25 mg via INTRAVENOUS
  Administered 2012-01-12: 5 mg via INTRAVENOUS
  Administered 2012-01-12: 10 mg via INTRAVENOUS

## 2012-01-12 MED ORDER — SODIUM CHLORIDE 0.9 % IR SOLN
Status: DC | PRN
  Administered 2012-01-12 (×4)

## 2012-01-12 MED ORDER — ONDANSETRON HCL 4 MG/2ML IJ SOLN: 4.0000 mg | Freq: Once | INTRAMUSCULAR | Status: DC

## 2012-01-12 MED ORDER — VANCOMYCIN HCL IN DEXTROSE 1-5 GM/200ML-% IV SOLN
INTRAVENOUS | Status: AC
  Filled 2012-01-12: qty 200

## 2012-01-12 MED ORDER — SUCCINYLCHOLINE CHLORIDE 20 MG/ML IJ SOLN
INTRAMUSCULAR | Status: AC
  Filled 2012-01-12: qty 1

## 2012-01-12 MED ORDER — FENTANYL CITRATE 0.05 MG/ML IJ SOLN
INTRAMUSCULAR | Status: DC | PRN
  Administered 2012-01-12: 50 ug via INTRAVENOUS
  Administered 2012-01-12: 25 ug via INTRAVENOUS

## 2012-01-12 SURGICAL SUPPLY — 57 items
ARTHROWAND PARAGON T2 (SURGICAL WAND)
BAG HAMPER (MISCELLANEOUS) ×2 IMPLANT
BANDAGE ELASTIC 6 VELCRO NS (GAUZE/BANDAGES/DRESSINGS) ×2 IMPLANT
BLADE AGGRESSIVE PLUS 4.0 (BLADE) ×2 IMPLANT
BLADE SURG SZ11 CARB STEEL (BLADE) ×2 IMPLANT
CHLORAPREP W/TINT 26ML (MISCELLANEOUS) ×4 IMPLANT
CLOTH BEACON ORANGE TIMEOUT ST (SAFETY) ×2 IMPLANT
COOLER CRYO IC GRAV AND TUBE (ORTHOPEDIC SUPPLIES) ×2 IMPLANT
CUFF CRYO KNEE LG 20X31 COOLER (ORTHOPEDIC SUPPLIES) ×2 IMPLANT
CUFF CRYO KNEE18X23 MED (MISCELLANEOUS) IMPLANT
CUFF TOURNIQUET SINGLE 34IN LL (TOURNIQUET CUFF) IMPLANT
CUFF TOURNIQUET SINGLE 44IN (TOURNIQUET CUFF) ×2 IMPLANT
CUTTER ANGLED DBL BITE 4.5 (BURR) IMPLANT
DECANTER SPIKE VIAL GLASS SM (MISCELLANEOUS) ×4 IMPLANT
DRSG XEROFORM 1X8 (GAUZE/BANDAGES/DRESSINGS) ×2 IMPLANT
FLOOR PAD 36X40 (MISCELLANEOUS)
GAUZE SPONGE 4X4 16PLY XRAY LF (GAUZE/BANDAGES/DRESSINGS) ×2 IMPLANT
GAUZE XEROFORM 5X9 LF (GAUZE/BANDAGES/DRESSINGS) ×2 IMPLANT
GLOVE BIO SURGEON STRL SZ7 (GLOVE) ×2 IMPLANT
GLOVE BIO SURGEON STRL SZ7.5 (GLOVE) ×2 IMPLANT
GLOVE BIOGEL PI IND STRL 7.0 (GLOVE) ×3 IMPLANT
GLOVE BIOGEL PI INDICATOR 7.0 (GLOVE) ×3
GLOVE ECLIPSE 6.5 STRL STRAW (GLOVE) ×4 IMPLANT
GLOVE SKINSENSE NS SZ8.0 LF (GLOVE) ×1
GLOVE SKINSENSE STRL SZ8.0 LF (GLOVE) ×1 IMPLANT
GLOVE SS BIOGEL STRL SZ 6.5 (GLOVE) ×1 IMPLANT
GLOVE SS N UNI LF 8.5 STRL (GLOVE) ×2 IMPLANT
GLOVE SUPERSENSE BIOGEL SZ 6.5 (GLOVE) ×1
GOWN STRL REIN XL XLG (GOWN DISPOSABLE) ×8 IMPLANT
HLDR LEG FOAM (MISCELLANEOUS) ×1 IMPLANT
IV NS IRRIG 3000ML ARTHROMATIC (IV SOLUTION) ×10 IMPLANT
KIT BLADEGUARD II DBL (SET/KITS/TRAYS/PACK) ×2 IMPLANT
KIT ROOM TURNOVER AP CYSTO (KITS) ×2 IMPLANT
LEG HOLDER FOAM (MISCELLANEOUS) ×1
MANIFOLD NEPTUNE II (INSTRUMENTS) ×2 IMPLANT
MARKER SKIN DUAL TIP RULER LAB (MISCELLANEOUS) ×2 IMPLANT
NEEDLE HYPO 18GX1.5 BLUNT FILL (NEEDLE) ×2 IMPLANT
NEEDLE HYPO 21X1.5 SAFETY (NEEDLE) ×2 IMPLANT
NEEDLE SPNL 18GX3.5 QUINCKE PK (NEEDLE) ×2 IMPLANT
NS IRRIG 1000ML POUR BTL (IV SOLUTION) ×2 IMPLANT
PACK ARTHRO LIMB DRAPE STRL (MISCELLANEOUS) ×2 IMPLANT
PAD ABD 5X9 TENDERSORB (GAUZE/BANDAGES/DRESSINGS) ×2 IMPLANT
PAD ARMBOARD 7.5X6 YLW CONV (MISCELLANEOUS) ×2 IMPLANT
PAD FLOOR 36X40 (MISCELLANEOUS) IMPLANT
PADDING CAST COTTON 6X4 STRL (CAST SUPPLIES) ×2 IMPLANT
SET ARTHROSCOPY INST (INSTRUMENTS) ×2 IMPLANT
SET ARTHROSCOPY PUMP TUBE (IRRIGATION / IRRIGATOR) ×2 IMPLANT
SET BASIN LINEN APH (SET/KITS/TRAYS/PACK) ×2 IMPLANT
SPONGE GAUZE 4X4 12PLY (GAUZE/BANDAGES/DRESSINGS) ×2 IMPLANT
STRIP CLOSURE SKIN 1/2X4 (GAUZE/BANDAGES/DRESSINGS) IMPLANT
SUT ETHILON 3 0 FSL (SUTURE) ×2 IMPLANT
SYR 30ML LL (SYRINGE) ×2 IMPLANT
SYRINGE 10CC LL (SYRINGE) ×2 IMPLANT
WAND 50 DEG COVAC W/CORD (SURGICAL WAND) ×2 IMPLANT
WAND 90 DEG TURBOVAC W/CORD (SURGICAL WAND) IMPLANT
WAND ARTHRO PARAGON T2 (SURGICAL WAND) IMPLANT
YANKAUER SUCT BULB TIP 10FT TU (MISCELLANEOUS) ×8 IMPLANT

## 2012-01-12 NOTE — Anesthesia Preprocedure Evaluation (Signed)
Anesthesia Evaluation  Patient identified by MRN, date of birth, ID band Patient awake    Reviewed: Allergy & Precautions, H&P , NPO status , Patient's Chart, lab work & pertinent test results  Airway Mallampati: II TM Distance: >3 FB Neck ROM: Full    Dental  (+) Edentulous Upper   Pulmonary sleep apnea ,    Pulmonary exam normal       Cardiovascular hypertension, Pt. on medications Rhythm:Regular Rate:Normal     Neuro/Psych  Headaches, Seizures -, Well Controlled,  PSYCHIATRIC DISORDERS Anxiety Depression    GI/Hepatic GERD-  ,  Endo/Other  Hypothyroidism   Renal/GU      Musculoskeletal  (+) Arthritis -,   Abdominal (+) + obese,  Abdomen: soft.    Peds  Hematology negative hematology ROS (+)   Anesthesia Other Findings   Reproductive/Obstetrics negative OB ROS                           Anesthesia Physical Anesthesia Plan  ASA: III  Anesthesia Plan: General   Post-op Pain Management:    Induction: Intravenous, Rapid sequence and Cricoid pressure planned  Airway Management Planned: Oral ETT  Additional Equipment:   Intra-op Plan:   Post-operative Plan: Extubation in OR  Informed Consent: I have reviewed the patients History and Physical, chart, labs and discussed the procedure including the risks, benefits and alternatives for the proposed anesthesia with the patient or authorized representative who has indicated his/her understanding and acceptance.     Plan Discussed with: CRNA  Anesthesia Plan Comments:         Anesthesia Quick Evaluation

## 2012-01-12 NOTE — Transfer of Care (Signed)
Immediate Anesthesia Transfer of Care Note  Patient: Diane [name]  Procedure(s) Performed: Procedure(s) (LRB): KNEE ARTHROSCOPY WITH MEDIAL MENISECTOMY (Right)  Patient Location: PACU  Anesthesia Type: General  Level of Consciousness: awake  Airway & Oxygen Therapy: Patient Spontanous Breathing and non-rebreather face mask  Post-op Assessment: Report given to PACU RN, Post -op Vital signs reviewed and stable and Patient moving all extremities  Post vital signs: Reviewed and stable  Complications: No apparent anesthesia complications

## 2012-01-12 NOTE — Op Note (Signed)
01/12/2012  9:01 AM  PATIENT:  Diane [name]  61 y.o. female  PRE-OPERATIVE DIAGNOSIS:  Medial Meniscal Tear Right Knee  POST-OPERATIVE DIAGNOSIS:  Medial Meniscal Tear Right Knee OSTEOARTHRITIS   PROCEDURE:  Procedure(s) (LRB): RIGHT  KNEE ARTHROSCOPY WITH MEDIAL MENISECTOMY (Right) DEBRIDEMENT MEDIAL FEMORAL CONDYLE FLAP TEAR  SURGEON:  Surgeon(s) and Role:    * Vickki Hearing, MD - Primary  PHYSICIAN ASSISTANT:   ASSISTANTS: none   ANESTHESIA:   GENERAL   EBL:  Total I/O In: 700 [I.V.:700] Out: 0   BLOOD ADMINISTERED:none  DRAINS: none   LOCAL MEDICATIONS USED:  MARCAINE   , Amount: 60 ml and OTHER EPI  SPECIMEN:  No Specimen  DISPOSITION OF SPECIMEN:  N/A  COUNTS:  YES  TOURNIQUET:  * Missing tourniquet times found for documented tourniquets in log:  [id number] *  DICTATION: .Dragon Dictation  PLAN OF CARE: HOME   PATIENT DISPOSITION:  PACU - hemodynamically stable.   Delay start of Pharmacological VTE agent (>24hrs) due to surgical blood loss or risk of bleeding: not applicableDetails of procedure. After proper site marking of the right knee and chart update the patient was taken to the surgery. She was given vancomycin due to penicillin allergy or a graft she was given general anesthesia and placed supine on the operating table. Her right leg was prepped and draped in sterile technique the left leg was placed in a well leg holder  The timeout procedure was executed  The site was confirmed as the right knee and the procedure was confirmed as an arthroscopy with partial medial meniscectomy  A lateral portal was established and the scope was introduced into the suprapatellar pouch. A diagnostic arthroscopy was performed. 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 This seemed to be a degenerative type tear with a traumatic component   A duckbill type forcep was used to debride the meniscal tear the meniscal fragments were removed with a motorized shaver the meniscus was balanced with a 50 ArthroCare wand  The chondral flap tear was debrided with the shaver.  The probe was placed back into the medial compartment and the stable rim was confirmed  The knee joint was irrigated with the wash mode of the arthroscopic pump.  The balance of 60 cc of Marcaine with epinephrine was injected into the knee. The portals were closed with 3-0 nylon. A dressing was applied followed by a Cryo/Cuff which was activated. The patient was extubated and taken to the recovery room in stable condition

## 2012-01-12 NOTE — Anesthesia Postprocedure Evaluation (Signed)
Anesthesia Post Note  Patient: Diane [name]  Procedure(s) Performed: Procedure(s) (LRB): KNEE ARTHROSCOPY WITH MEDIAL MENISECTOMY (Right)  Anesthesia type: General  Patient location: PACU  Post pain: Pain level controlled  Post assessment: Post-op Vital signs reviewed, Patient's Cardiovascular Status Stable, Respiratory Function Stable, Patent Airway, No signs of Nausea or vomiting and Pain level controlled  Last Vitals:  Filed Vitals:   01/12/12 0924  BP: 104/52  Pulse: 73  Temp: 36.5 C  Resp: 16    Post vital signs: Reviewed and stable  Level of consciousness: awake and alert   Complications: No apparent anesthesia complications

## 2012-01-12 NOTE — Interval H&P Note (Signed)
History and Physical Interval Note:  01/12/2012 7:23 AM  Diane [name]  has presented today for surgery, with the diagnosis of right meniscal tear  The various methods of treatment have been discussed with the patient and family. After consideration of risks, benefits and other options for treatment, the patient has consented to  Procedure(s) (LRB): KNEE ARTHROSCOPY WITH MEDIAL MENISECTOMY (Right) as a surgical intervention .  The patients' history has been reviewed, patient examined, no change in status, stable for surgery.  I have reviewed the patients' chart and labs.  Questions were answered to the patient's satisfaction.     Fuller Canada

## 2012-01-12 NOTE — Brief Op Note (Addendum)
01/12/2012  9:01 AM  PATIENT:  Diane [name]  61 y.o. female  PRE-OPERATIVE DIAGNOSIS:  Medial Meniscal Tear Right Knee  POST-OPERATIVE DIAGNOSIS:  Medial Meniscal Tear Right Knee OSTEOARTHRITIS   PROCEDURE:  Procedure(s) (LRB): RIGHT  KNEE ARTHROSCOPY WITH MEDIAL MENISECTOMY (Right) DEBRIDEMENT MEDIAL FEMORAL CONDYLE FLAP TEAR  SURGEON:  Surgeon(s) and Role:    * Vickki Hearing, MD - Primary  PHYSICIAN ASSISTANT:   ASSISTANTS: none   ANESTHESIA:   GENERAL   EBL:  Total I/O In: 700 [I.V.:700] Out: 0   BLOOD ADMINISTERED:none  DRAINS: none   LOCAL MEDICATIONS USED:  MARCAINE   , Amount: 60 ml and OTHER EPI  SPECIMEN:  No Specimen  DISPOSITION OF SPECIMEN:  N/A  COUNTS:  YES  TOURNIQUET:  * Missing tourniquet times found for documented tourniquets in log:  [id number] *  DICTATION: .Dragon Dictation  PLAN OF CARE: HOME   PATIENT DISPOSITION:  PACU - hemodynamically stable.   Delay start of Pharmacological VTE agent (>24hrs) due to surgical blood loss or risk of bleeding: not applicable

## 2012-01-12 NOTE — Anesthesia Procedure Notes (Signed)
Procedure Name: Intubation Date/Time: 01/12/2012 7:57 AM Performed by: Franco Nones Pre-anesthesia Checklist: Patient identified, Patient being monitored, Timeout performed, Emergency Drugs available and Suction available Patient Re-evaluated:Patient Re-evaluated prior to inductionOxygen Delivery Method: Circle System Utilized Preoxygenation: Pre-oxygenation with 100% oxygen Intubation Type: IV induction, Rapid sequence and Cricoid Pressure applied Laryngoscope Size: Miller and 2 Grade View: Grade I Tube type: Oral Tube size: 7.0 mm Number of attempts: 1 Airway Equipment and Method: stylet Placement Confirmation: ETT inserted through vocal cords under direct vision,  positive ETCO2 and breath sounds checked- equal and bilateral Secured at: 21 cm Tube secured with: Tape Dental Injury: Teeth and Oropharynx as per pre-operative assessment

## 2012-01-12 NOTE — Discharge Instructions (Signed)
Arthroscopic Procedure, Knee An arthroscopic procedure can find what is wrong with your knee. PROCEDURE Arthroscopy is a surgical technique that allows your orthopedic surgeon to diagnose and treat your knee injury with accuracy. They will look into your knee through a small instrument. This is almost like a small (pencil sized) telescope. Because arthroscopy affects your knee less than open knee surgery, you can anticipate a more rapid recovery. Taking an active role by following your caregiver's instructions will help with rapid and complete recovery. Use crutches, rest, elevation, ice, and knee exercises as instructed. The length of recovery depends on various factors including type of injury, age, physical condition, medical conditions, and your rehabilitation. Your knee is the joint between the large bones (femur and tibia) in your leg. Cartilage covers these bone ends which are smooth and slippery and allow your knee to bend and move smoothly. Two menisci, thick, semi-lunar shaped pads of cartilage which form a rim inside the joint, help absorb shock and stabilize your knee. Ligaments bind the bones together and support your knee joint. Muscles move the joint, help support your knee, and take stress off the joint itself. Because of this all programs and physical therapy to rehabilitate an injured or repaired knee require rebuilding and strengthening your muscles. AFTER THE PROCEDURE  After the procedure, you will be moved to a recovery area until most of the effects of the medication have worn off. Your caregiver will discuss the test results with you.   Only take over-the-counter or prescription medicines for pain, discomfort, or fever as directed by your caregiver.  SEEK MEDICAL CARE IF:   You have increased bleeding from your wounds.   You see redness, swelling, or have increasing pain in your wounds.   You have pus coming from your wound.   You have an oral temperature above 102 F (38.9  C).   You notice a bad smell coming from the wound or dressing.   You have severe pain with any motion of your knee.  SEEK IMMEDIATE MEDICAL CARE IF:   You develop a rash.   You have difficulty breathing.   You have any allergic problems.  Document Released: 08/25/2000 Document Revised: 08/17/2011 Document Reviewed: 03/18/2008 Exit[employer] Patient Information 2012 [employer]. 

## 2012-01-15 ENCOUNTER — Ambulatory Visit (INDEPENDENT_AMBULATORY_CARE_PROVIDER_SITE_OTHER): Payer: Medicare HMO | Admitting: Orthopedic Surgery

## 2012-01-15 ENCOUNTER — Telehealth: Payer: Self-pay | Admitting: Orthopedic Surgery

## 2012-01-15 ENCOUNTER — Encounter: Payer: Self-pay | Admitting: Orthopedic Surgery

## 2012-01-15 DIAGNOSIS — Z9889 Other specified postprocedural states: Secondary | ICD-10-CM

## 2012-01-15 NOTE — Telephone Encounter (Signed)
CORRECTION:  Insurer re: pre-authorization information for out-patient surgery (01/12/12):  Per phone with [facility], ph [phone], per Carlisle Beers, no pre-authorization is required; REF # [id number].

## 2012-01-15 NOTE — Patient Instructions (Signed)
Wear brace when walking   Ice as needed to control swelling   Start PT at [facility]

## 2012-01-15 NOTE — Progress Notes (Signed)
Patient ID: Diane [name], female   DOB: [birth year], 61 y.o.   MRN: [id number] Chief Complaint  Patient presents with  . Knee Problem    surgery right knee / Friday May 3rd   C/o pain and swelling   Postop visit #1  Procedure knee arthroscopy with partial medial meniscectomy and abrasion of the medial femoral condyle  Diagnosis osteoarthritis torn medial meniscus  Findings grade 3 changes of the medial femoral condyle with a posterior horn medial meniscus tear  Pain medication Lorcet plus as hydrocodone 7.5  Appearance of knee mild swelling portals clean sutures removed  Physical therapy will be done at [facility] I placed her in a brace because she was ambulating so gingerly I thought it would help her with her gait pattern. She is also using a rolling walker.

## 2012-01-18 ENCOUNTER — Telehealth: Payer: Self-pay | Admitting: Orthopedic Surgery

## 2012-01-18 NOTE — Telephone Encounter (Signed)
Returned call, left voice mail.

## 2012-01-18 NOTE — Telephone Encounter (Signed)
Patient and daughter called to ask if okay to shower.  Stated first that she still had her stitches in, then said, "stitches were taken out" as of Monday, 01/15/12 appointment.  Please advise.  She is at ph# (daughter's) [phone].

## 2012-01-25 ENCOUNTER — Encounter (HOSPITAL_COMMUNITY): Payer: Self-pay | Admitting: Psychiatry

## 2012-01-25 ENCOUNTER — Ambulatory Visit (INDEPENDENT_AMBULATORY_CARE_PROVIDER_SITE_OTHER): Payer: Medicare HMO | Admitting: Psychiatry

## 2012-01-25 DIAGNOSIS — F39 Unspecified mood [affective] disorder: Secondary | ICD-10-CM

## 2012-01-25 LAB — COMPREHENSIVE METABOLIC PANEL
ALT: 14 U/L (ref 0–35)
AST: 15 U/L (ref 0–37)
Albumin: 3.7 g/dL (ref 3.5–5.2)
Alkaline Phosphatase: 55 U/L (ref 39–117)
BUN: 15 mg/dL (ref 6–23)
CO2: 30 mEq/L (ref 19–32)
Calcium: 8.8 mg/dL (ref 8.4–10.5)
Chloride: 107 mEq/L (ref 96–112)
Creat: 0.88 mg/dL (ref 0.50–1.10)
Glucose, Bld: 97 mg/dL (ref 70–99)
Potassium: 4 mEq/L (ref 3.5–5.3)
Sodium: 145 mEq/L (ref 135–145)
Total Bilirubin: 0.3 mg/dL (ref 0.3–1.2)
Total Protein: 6.7 g/dL (ref 6.0–8.3)

## 2012-01-25 LAB — HEMOGLOBIN A1C
Hgb A1c MFr Bld: 5.8 % — ABNORMAL HIGH (ref <5.7)
Mean Plasma Glucose: 120 mg/dL — ABNORMAL HIGH (ref <117)

## 2012-01-25 MED ORDER — AMITRIPTYLINE HCL 50 MG PO TABS: 50.0000 mg | ORAL_TABLET | Freq: Every day | ORAL | Status: DC

## 2012-01-25 MED ORDER — GABAPENTIN 300 MG PO CAPS: 300.0000 mg | ORAL_CAPSULE | Freq: Three times a day (TID) | ORAL | Status: DC

## 2012-01-25 NOTE — Progress Notes (Signed)
Chief complaint Medication management  History of present illness Patient is 61 year old [place] American female who came for her followup appointment.  She has recently last week and she is recovering from it.  She complain of pain and taking hydrocodone as needed.  She is hoping that she is able to recover fast.  She misses her social life.  She feels sometimes burdened and lonely.  She denies any active or passive suicidal thoughts.  She likes her current psychiatric medication.  She sleeps better however she has some residual symptoms of mood lability .  She has not done Depakote however she still to have blood work very soon.  She denies any agitation anger or any crying spells.  She denies any drinking or any side effects of medication.  She likes increasing Neurontin which is helping her anxiety.  Current psychiatric medication Depakote 750 mg at bedtime Amitriptyline 50 mg at bedtime Neurontin 100 mg 3 times a day She is getting Xanax from her primary care physician.  Past psychiatric history Patient has history of one psychiatric admission in [facility]. She took overdose on her pills she was also intoxicated with alcohol. Patient has also seen by psychiatrist in 2004. At that time she was getting more great amount of benzodiazepine pain medication.  Family history Patient endorsed significant history of alcoholism in the family.  Medical history Patient has history of hypertension, allergic rhinitis, hypothyroidism, chronic back pain, hyperlipidemia and obesity.  She is waiting for her right knee surgery.  Psychosocial history Patient has been married for 16 years. She has 2 children. She is married however her relationship is very tense. She has history of leaving her husband in the past.  Currently she lives by herself.  Mental status examination Patient is mildly obese female who is casually dressed and fairly groomed.  She complained of pain and  walking with some difficulty.  She using stick to walk.  She is cooperative and maintained fair eye contact. Her speech is clear and coherent. Her attention and concentration is fair. She denies any active or passive suicidal thinking and homicidal thinking. She's alert and oriented x3. There are no psychotic symptoms present at this time. She denies any auditory or visual hallucination. Her insight judgment is fair and her impulse control is okay.  Assessment Axis I bipolar disorder NOS, benzodiazepine abuse, alcohol abuse Axis II deferred Axis III see medical history Axis IV moderate Axis V 60-65  Plan I reinforced blood work.  I will continue Neurontin and amitriptyline .  We will authorize refill once she had Depakote level.  At this time patient is not having any side effects.  She liked to continue her current psychiatric medication.  I recommend to call us if she is any question or concern about the medication or if she feels her symptoms are getting worse.  I will see her again in 2 months.

## 2012-01-26 LAB — CBC WITH DIFFERENTIAL/PLATELET
Basophils Absolute: 0 10*3/uL (ref 0.0–0.1)
Basophils Relative: 0 % (ref 0–1)
Eosinophils Absolute: 0.1 10*3/uL (ref 0.0–0.7)
Eosinophils Relative: 2 % (ref 0–5)
HCT: 39.8 % (ref 36.0–46.0)
Hemoglobin: 12 g/dL (ref 12.0–15.0)
Lymphocytes Relative: 51 % — ABNORMAL HIGH (ref 12–46)
Lymphs Abs: 2.9 10*3/uL (ref 0.7–4.0)
MCH: 26.9 pg (ref 26.0–34.0)
MCHC: 30.2 g/dL (ref 30.0–36.0)
MCV: 89.2 fL (ref 78.0–100.0)
Monocytes Absolute: 0.5 10*3/uL (ref 0.1–1.0)
Monocytes Relative: 9 % (ref 3–12)
Neutro Abs: 2.1 10*3/uL (ref 1.7–7.7)
Neutrophils Relative %: 37 % — ABNORMAL LOW (ref 43–77)
Platelets: 212 10*3/uL (ref 150–400)
RBC: 4.46 MIL/uL (ref 3.87–5.11)
RDW: 13.8 % (ref 11.5–15.5)
WBC: 5.6 10*3/uL (ref 4.0–10.5)

## 2012-01-26 LAB — VALPROIC ACID LEVEL: Valproic Acid Lvl: 92.4 ug/mL (ref 50.0–100.0)

## 2012-01-29 ENCOUNTER — Ambulatory Visit (INDEPENDENT_AMBULATORY_CARE_PROVIDER_SITE_OTHER): Payer: Medicare HMO | Admitting: Orthopedic Surgery

## 2012-01-29 ENCOUNTER — Encounter: Payer: Self-pay | Admitting: Orthopedic Surgery

## 2012-01-29 VITALS — Ht 63.0 in | Wt 233.0 lb

## 2012-01-29 DIAGNOSIS — IMO0002 Reserved for concepts with insufficient information to code with codable children: Secondary | ICD-10-CM

## 2012-01-29 DIAGNOSIS — M23203 Derangement of unspecified medial meniscus due to old tear or injury, right knee: Secondary | ICD-10-CM

## 2012-01-29 DIAGNOSIS — M1710 Unilateral primary osteoarthritis, unspecified knee: Secondary | ICD-10-CM

## 2012-01-29 DIAGNOSIS — M23305 Other meniscus derangements, unspecified medial meniscus, unspecified knee: Secondary | ICD-10-CM

## 2012-01-29 NOTE — Progress Notes (Signed)
Patient ID: Diane [name], female   DOB: 07/13/1951, 61 y.o.   MRN: [id number] surgery right knee / Friday May 3rd   C/o pain and swelling  Postop visit # 2 Procedure knee arthroscopy with partial medial meniscectomy and abrasion of the medial femoral condyle  Diagnosis osteoarthritis torn medial meniscus  Findings grade 3 changes of the medial femoral condyle with a posterior horn medial meniscus tear  Last visit she was braced and that helped significantly with her gait. Her PT notes look good. She has improved and now walks with a cane has adequate flexion in the knee at this time.  Continued therapy followup 3 weeks should be able to remove her brace at that time

## 2012-01-29 NOTE — Patient Instructions (Signed)
Finish physical therapy  Continue knee brace

## 2012-01-31 ENCOUNTER — Ambulatory Visit (INDEPENDENT_AMBULATORY_CARE_PROVIDER_SITE_OTHER): Payer: Medicare HMO | Admitting: Psychology

## 2012-01-31 DIAGNOSIS — F39 Unspecified mood [affective] disorder: Secondary | ICD-10-CM

## 2012-01-31 DIAGNOSIS — F1010 Alcohol abuse, uncomplicated: Secondary | ICD-10-CM

## 2012-02-10 ENCOUNTER — Other Ambulatory Visit (HOSPITAL_COMMUNITY): Payer: Self-pay | Admitting: Psychiatry

## 2012-02-11 ENCOUNTER — Other Ambulatory Visit (HOSPITAL_COMMUNITY): Payer: Self-pay | Admitting: Psychiatry

## 2012-02-26 ENCOUNTER — Encounter: Payer: Self-pay | Admitting: Orthopedic Surgery

## 2012-02-26 ENCOUNTER — Ambulatory Visit (INDEPENDENT_AMBULATORY_CARE_PROVIDER_SITE_OTHER): Payer: Medicare HMO | Admitting: Orthopedic Surgery

## 2012-02-26 VITALS — BP 118/78 | Ht 63.0 in | Wt 233.0 lb

## 2012-02-26 DIAGNOSIS — M23305 Other meniscus derangements, unspecified medial meniscus, unspecified knee: Secondary | ICD-10-CM

## 2012-02-26 DIAGNOSIS — M23203 Derangement of unspecified medial meniscus due to old tear or injury, right knee: Secondary | ICD-10-CM

## 2012-02-26 DIAGNOSIS — M1710 Unilateral primary osteoarthritis, unspecified knee: Secondary | ICD-10-CM

## 2012-02-26 DIAGNOSIS — IMO0002 Reserved for concepts with insufficient information to code with codable children: Secondary | ICD-10-CM

## 2012-02-26 NOTE — Progress Notes (Signed)
Patient ID: Diane [name], female   DOB: 1951/06/29, 61 y.o.   MRN: [id number] Chief Complaint  Patient presents with  . Routine Post Op    post op 3, rt knee, DOS 01/12/12    BP 118/78  Ht 5\' 3"  (1.6 m)  Wt 233 lb (105.688 kg)  BMI 41.27 kg/m2   RIGHT knee arthroscopy. He did well continues to improve. No major symptoms at this time other than some aching.  Recommend continue exercises at home. Follow up as needed. She's regained full knee flexion and good strength and extension.

## 2012-02-26 NOTE — Patient Instructions (Addendum)
Home exercises  

## 2012-02-29 ENCOUNTER — Encounter (HOSPITAL_COMMUNITY): Payer: Self-pay | Admitting: Psychology

## 2012-02-29 NOTE — Progress Notes (Signed)
Patient:  Diane [name]   DOB: 1950-11-27  MR Number: [id number]  Location: [facility] [address] [place] [address] Dept: [phone]  Start: 2 PM End: 3 PM  Provider/Observer:     Hershal Coria PSYD  Chief Complaint:      Chief Complaint  Patient presents with  . Anxiety  . Agitation  . Depression    Reason For Service:     the patient initially came in because of issues of increasing symptoms of depression, anxiety and mood swings. She was followed often on for the last four years there are office and continues to see Dr. Lolly Mustache for psychiatric interventions. The patient continues to have a lot of coping issues and depression as well as other mood swings but denies suicidal ideation.  More recently, a very close friend of hers in fact the only close friend she has passed away at the patient has been coping very poorly with this and essentially has begun to isolate herself again and restart regular alcohol consumption.  Interventions Strategy:  Cognitive/behavioral psychotherapeutic interventions  Participation Level:   Active  Participation Quality:  Appropriate      Behavioral Observation:  Well Groomed, Alert, and Depressed.   Current Psychosocial Factors: The patient reports that she is continuing to stress about situations with neighbors and various family members. She reports that people can be very agitating to her and that she tends to avoid things and when she does become agitated she would drink. However, she does acknowledge a significant reduction of alcohol use [place] at.  Content of Session:   Review current symptoms and continue to work on therapeutic interventions to build better coping skills and strategies.  Current Status:   The patient does report continued significant reduction of alcohol and overall her mood has been more stable and she is realizing that alcohol  plays a role in some of her later mood instabilities.  Patient Progress:   The patient had been doing fairly well in months past but more recently she lost a very close friend and her alcohol use and depression have worsened.  Target Goals:   Target goals include dealing with significant issues around a mood disorder that predominately is a depressive in nature but also has times of hypomanic episodes. Working on Pharmacologist to facilitate mood stability are primary. More recently, buried in issues and other depression have also been present and these may have a worsening effect on her underlying psychiatric condition.  Last Reviewed:   01/31/2012  Goals Addressed Today:    We worked on building and continuing to develop coping skills around her underlying mood disorder as well as her recurrent use of alcohol which exacerbates her symptoms.  The patient has substantially reduced any alcohol use and this has helped her with some of the agitation she has been experiencing and she has noted this improvement..  Impression/Diagnosis:   the patient had a long history of mood disorder including mood swings into stages of depression. She also has used alcohol as a therapeutic intervention.  Diagnosis:    Axis I:  1. Mood disorder   2. Alcohol abuse         Axis II: No diagnosis

## 2012-03-01 ENCOUNTER — Ambulatory Visit (HOSPITAL_COMMUNITY): Payer: Self-pay | Admitting: Psychology

## 2012-03-07 ENCOUNTER — Ambulatory Visit (HOSPITAL_COMMUNITY): Payer: Medicare HMO | Admitting: Psychology

## 2012-03-11 ENCOUNTER — Other Ambulatory Visit: Payer: Self-pay | Admitting: Orthopedic Surgery

## 2012-03-11 DIAGNOSIS — M25569 Pain in unspecified knee: Secondary | ICD-10-CM

## 2012-03-11 MED ORDER — HYDROCODONE-ACETAMINOPHEN 5-325 MG PO TABS: 1.0000 | ORAL_TABLET | Freq: Four times a day (QID) | ORAL | Status: AC | PRN

## 2012-03-26 ENCOUNTER — Encounter (HOSPITAL_COMMUNITY): Payer: Self-pay | Admitting: Psychiatry

## 2012-03-26 ENCOUNTER — Ambulatory Visit (INDEPENDENT_AMBULATORY_CARE_PROVIDER_SITE_OTHER): Payer: Medicare HMO | Admitting: Psychiatry

## 2012-03-26 VITALS — Wt 231.0 lb

## 2012-03-26 DIAGNOSIS — F319 Bipolar disorder, unspecified: Secondary | ICD-10-CM

## 2012-03-26 DIAGNOSIS — F1010 Alcohol abuse, uncomplicated: Secondary | ICD-10-CM

## 2012-03-26 DIAGNOSIS — F39 Unspecified mood [affective] disorder: Secondary | ICD-10-CM

## 2012-03-26 DIAGNOSIS — F1310 Sedative, hypnotic or anxiolytic abuse, uncomplicated: Secondary | ICD-10-CM

## 2012-03-26 MED ORDER — GABAPENTIN 300 MG PO CAPS: 300.0000 mg | ORAL_CAPSULE | Freq: Three times a day (TID) | ORAL | Status: DC

## 2012-03-26 MED ORDER — AMITRIPTYLINE HCL 50 MG PO TABS: 50.0000 mg | ORAL_TABLET | Freq: Every day | ORAL | Status: DC

## 2012-03-26 MED ORDER — DIVALPROEX SODIUM 250 MG PO DR TAB: DELAYED_RELEASE_TABLET | ORAL | Status: DC

## 2012-03-26 NOTE — Progress Notes (Signed)
Chief complaint Medication management  History of present illness Patient is 61 year old [place] American female who came for her followup appointment.  She has knee surgery in May and she still recovering from it.  She continues to have pain in her knee.  She is taking hydrocodone prescribed by her primary care Dr.  She has blood work and her blood results shows normal WBC count and liver function tests.  Her hemoglobin A1c is 5.8 and her Depakote level is 90 4.4.  Patient continues to have anxiety and some insomnia however she is compliant with the medication and feeling better from the past.  She denies any recent agitation anger mood swing.  She denies any crying spells.  She claims that she's not drinking anymore.  She is taking care of her grand kids and claimed that she don't have time to drink.  She is taking Xanax and Neurontin.  Current psychiatric medication Depakote 750 mg at bedtime Amitriptyline 50 mg at bedtime Neurontin 100 mg 3 times a day She is getting Xanax from her primary care physician.  Past psychiatric history Patient has history of one [facility]. She took overdose on her pills she was also intoxicated with alcohol. Patient has also seen by psychiatrist in 2004. At that time she was getting more great amount of benzodiazepine pain medication.  Family history Patient endorsed significant history of alcoholism in the family.  Medical history Patient has history of hypertension, allergic rhinitis, hypothyroidism, chronic back pain, hyperlipidemia and obesity.  She is waiting for her right knee surgery.  Psychosocial history Patient has been married for 16 years. She has 2 children. She is married however her relationship is very tense. She has history of leaving her husband in the past.  Currently she lives by herself.  Mental status examination Patient is mildly obese female who is casually dressed and fairly groomed.   She has difficulty walking due to pain.  She is cooperative and maintained fair eye contact. Her speech is clear and coherent. Her attention and concentration is fair. She denies any active or passive suicidal thinking and homicidal thinking. She's alert and oriented x3. There are no psychotic symptoms present at this time. She denies any auditory or visual hallucination. Her insight judgment is fair and her impulse control is okay.  Assessment Axis I bipolar disorder NOS, benzodiazepine abuse, alcohol abuse Axis II deferred Axis III see medical history Axis IV moderate Axis V 60-65  Plan I review psychosocial stressor, recent blood test and Depakote level.  I will continue his Elavil Neurontin and Depakote.  Her level level is 94.2 which was done on May 16.  I explained the risk and benefits of medication.  I recommend to call us if she is any question or concern about the medication or if she feels worsening of the symptoms.  Time spent 30 minutes.  I will see her again in 2 months.  Portion of this note is generated with voice dictation software and may contain typographical error.

## 2012-04-11 ENCOUNTER — Telehealth: Payer: Self-pay | Admitting: Orthopedic Surgery

## 2012-04-11 NOTE — Telephone Encounter (Signed)
Patient had called originally on Friday, 04/05/12, to relay that she had some swelling and discomfort in her operative knee (arthroscopy surgery in May 2013).  I relayed that Dr. Romeo Apple was away for 1 week, and provided her with Dr. Sanjuan Dame office phone number, as this office is covering.   Jacki Cones from Dr. Sanjuan Dame office called to request notes, and relayed that they were working on scheduling an appointment for patient.    I followed up, and patient states she was seen by Dr. Hilda Lias on Tuesday, 04/09/12, and had received an injection and some medication, and was advised to follow up with Dr. Romeo Apple if not feeling better.  States she is doing well.  Report to follow.

## 2012-05-28 ENCOUNTER — Ambulatory Visit (HOSPITAL_COMMUNITY): Payer: Self-pay | Admitting: Psychiatry

## 2012-05-28 ENCOUNTER — Encounter: Payer: Self-pay | Admitting: Orthopedic Surgery

## 2012-05-28 ENCOUNTER — Ambulatory Visit (INDEPENDENT_AMBULATORY_CARE_PROVIDER_SITE_OTHER): Payer: Medicare HMO | Admitting: Orthopedic Surgery

## 2012-05-28 VITALS — BP 102/78 | Ht 63.0 in | Wt 233.0 lb

## 2012-05-28 DIAGNOSIS — IMO0002 Reserved for concepts with insufficient information to code with codable children: Secondary | ICD-10-CM

## 2012-05-28 DIAGNOSIS — M1710 Unilateral primary osteoarthritis, unspecified knee: Secondary | ICD-10-CM

## 2012-05-28 MED ORDER — HYDROCODONE-ACETAMINOPHEN 7.5-325 MG PO TABS: 1.0000 | ORAL_TABLET | ORAL | Status: DC | PRN

## 2012-05-28 NOTE — Patient Instructions (Addendum)
Call us after talking to your niece  Preparing for Knee Replacement Recovery from knee replacement surgery can be made easier and more comfortable by taking steps to be prepared before surgery. This includes:  Arranging for others to help you.   Preparing your home.   Making sure your body is prepared by doing a pre-operative exam and being as healthy as you can.   Doing exercises before your surgery as told by your caregiver.  Also, you can ease any concerns about your financial responsibilities by calling your insurance company after you decide to have surgery. In addition to your surgery and hospital stay, you will want to ask about your coverage for medical equipment, rehab facilities, and home care. ARRANGING FOR HELP   You will be getting stronger and more mobile every day. However, in the first couple weeks after surgery, it is unlikely you will be able to do all your daily activities as easily as before your surgery. You will tire easily and still have limited movement in your leg. Follow these guidelines to best arrange for the help you may need after your surgery:  Arrange for someone to drive you home from the hospital. Your surgeon will be able to tell you how many days you can expect to be in the hospital.   Cancel all work, care-giving, and volunteer responsibilities for at least 4 to 6 weeks after your surgery.   If you live alone, arrange for someone to care for your home and pets for the first 4 to 6 weeks.   Select someone with whom you feel comfortable to be with you day and night for the first week. This person will help you with your exercises and personal care, like bathing and using the toilet.   Arrange for drivers to bring you to and from your doctor and therapy appointments, as well as to the grocery store and other places you need to go, for at least 4 to 6 weeks.   Select 2 or 3 rehab facilities where you would be comfortable recovering just in case you are not  able to go directly home to recover.  PREPARING YOUR HOME  Remove all clutter from your floors.   To see if you will be able to move in these spaces with a wheeled walker, hold your hands out about 6 inches from your sides. Then walk from your bed to the bathroom. Walk from your resting spot to your kitchen and bathroom. If you do not hit anything with your hands, you probably have enough room.   Remove throw rugs.   Move the items you use often to shelves and drawers that are at countertop height.  Move items in your bathroom, kitchen, and bedroom.   Prepare a few meals that you can freeze and reheat later.   Do not plan on recovering in bed.  It is better for your health to sit upright. You may wish to use a recliner with a small table nearby. Keep the items you use most frequently on that table. These may include the TV remote, a cordless phone, a book or laptop computer, water glass, and any other items of your choice.   Consider adding grab bars in the shower and near the toilet.   While you are in the hospital, you will learn about equipment helpful for your recovery. Some of the equipment includes raised toilet seats, tub benches, and shower benches. Often, your hospital care team will help you decide what you  need and can direct you about where to buy these items. You may not need all of these items, and they are not often returnable, so it is not recommended that you buy them before going to the hospital.  PREPARING YOUR BODY  Complete a pre-operative exam. This will ensure that your body is healthy enough to safely complete this surgery. Be certain to bring a complete list of all your medicines and supplements (herbs and vitamins). You may be advised to have additional tests to ensure your safety.   Complete elective dental care and routine cleanings before the surgery. Germs from anywhere in your body, including those in your mouth, can travel to your new joint and infect it.  It is  important not to have any dental work performed for at least 3 months after your surgery. After surgery, be sure to tell your dentist about your joint replacement.   Maintain a healthy diet. Unless advised by your surgeon, do not drastically change your diet before your surgery.   Quit smoking. Get help from your caregiver if you need it.   The day before your surgery, follow your surgeon's directions for showering, eating and drinking. These directions are for your safety.  EXERCISES Your caregiver may have you do the following exercises before your surgery. Be sure to follow the exercise program your caregiver prescribes for you. Doing the exercises on both sides will help prepare your "good" side as well. While completing these exercises, remember:    Stretch as long as you can, up to 30 seconds, without pain developing.   You should only feel a gentle lengthening or release in the stretched tissue.  Ankle Pumps  While sitting with your knees straight, draw the top of your feet upwards by flexing your ankles.    Then, reverse the motion, pointing your toes downward.   Repeat 10 to 20 times. Complete this exercise 1 to 2 times per day.  Heel Slides 1. Lie on your back with both knees straight. (If this causes back discomfort, bend your opposite knee, placing your foot flat on the floor.)  2. Slowly slide your heel back toward your buttocks until you feel a gentle stretch in the front of your knee or thigh.  3. Slowly slide your heel back to the starting position.  4. Repeat 10 to 20 times. Complete this exercise 1 to 2 times per day.  Quadriceps Sets 1. Lie on your back with your sore leg extended and your opposite knee bent.  2. Gradually tense the muscles in the front of your thigh. You should see either your kneecap slide up toward your hip or increased dimpling just above the knee. This motion will push the back of the knee down toward the floor.   3. Hold the muscle as tight as you  can without increasing your pain for 10 seconds.  4. Relax the muscles slowly and completely in between each repetition.  5. Repeat 10 to 20 times. Complete this exercise 1 to 2 times per day.  Short Arc Kicks 1. Lie on your back. Place a 4 to 6 inch towel roll under your sore knee so that the knee slightly bends.  2. Raise only your lower leg by tightening the muscles in the front of your thigh. Do not allow your thigh to rise.  3. Hold this position for 5 seconds.  4. Repeat 10 to 20 times. Complete this exercise 1 to 2 times per day.  Straight Leg Raises 1.  Lie on your back with your sore leg extended and your opposite knee bent.   2. Tense the muscles in the front of your sore thigh. You should see either your kneecap slide up or increased dimpling just above the knee. Your thigh may even quiver.  3. Tighten these muscles even more and raise your leg 4 to 6 inches off the floor. Hold for 3 to 5 seconds.  4. Keeping these muscles tense, lower your leg.  5. Relax the muscles slowly and completely in between each repetition.  6. Repeat 10 to 20 times. Complete this exercise 1 to 2 times per day.  Arm Chair Push-ups 1. Find a firm, non-wheeled chair with solid armrests.  2. Sitting in the chair, extend your sore leg straight out in front of you.  3. Lift up your body weight, using your arms and opposite leg.  4. Slowly lower your body weight.   5. Repeat 10 to 20 times. Complete this exercise 1 to 2 times per day.  Document Released: 12/02/2010 Document Revised: 08/17/2011 Document Reviewed: 12/02/2010 [facility] Patient Information 2012 [place], [place].Total Knee Replacement In total knee replacement, the damaged knee is replaced with an artificial knee joint (prosthesis). The purpose of this surgery is to reduce pain and improve your range of motion. Regaining a near normal range of motion of your knee in the first 3 to 6 weeks after surgery is critical. Generally, these artificial joints last  a minimum of 10 years. By that time, about 1 in 10 patients will need another surgery to repair the loose prosthesis. LET YOUR CAREGIVER KNOW ABOUT:    Allergies to food or medicine.   Medicines taken, including vitamins, herbs, eyedrops, over-the-counter medicines, and creams.   Use of steroids (by mouth or creams).   Previous problems with anesthetics or numbing medicines.   History of bleeding problems or blood clots.   Previous surgery.   Other health problems, including diabetes and kidney problems.   Possibility of pregnancy, if this applies.  RISKS AND COMPLICATIONS    Knee pain.   Loss of range of motion of the knee (stiffness) or instability.   Loosening of the prosthesis.   Infection.  BEFORE THE PROCEDURE    If you smoke, quit.   You may need a replacement or addition of blood (transfusion) during this procedure. You may want to donate your own blood for storage several weeks before the procedure. This way, your own blood can be stored and given to you if needed. Talk to your surgeon about this option.   Do not eat or drink anything for as long as directed by your caregiver before the procedure.   You may bathe and brush your teeth before the procedure. Do not swallow the water when brushing your teeth.   Scrub the area to be operated on for 5 minutes in the morning before the procedure. Use special soap if you are directed to do so by your caregiver.   Take your regular medicines with a small sip of water unless otherwise instructed. Your caregiver will let you know if there are medicines that need to be stopped and for how long.   You should be present 60 minutes before your procedure or as directed by your caregiver.  PROCEDURE   Before surgery, an intravenous (IV) access for giving fluids will be started. You will be given medicines and/or gas to make you sleep (anesthetic). You may be given medicines in your back with a needle to  make you numb from the waist  down. Your surgeon will take out any damaged cartilage and bone. He or she will then put in new metal, plastic, or ceramic joint surfaces to restore alignment and function to your knee. AFTER THE PROCEDURE   You will be taken to the recovery area where a nurse will watch and check your progress. You may have a long, narrow tube (catheter) in your bladder after surgery. The catheter helps you empty your bladder (pass your urine). You may have drainage tubes coming out from under the dressing. These tubes attach to a device that removes blood or fluids that gather after surgery. Once you are awake, stable, and taking fluids well, you will be returned to your room. You will receive physical therapy as prescribed by your caregiver. If you do not have help at home, you may need to go to an extended care facility for a few weeks. If you are sent home with a continuous passive motion (CPM) machine, use it as instructed. Document Released: 12/04/2000 Document Revised: 08/17/2011 Document Reviewed: 06/30/2009 [facility] Patient Information 2012 [place], [place].

## 2012-05-28 NOTE — Progress Notes (Signed)
Subjective:     Patient ID: Diane [name], female   DOB: [birth year], 61 y.o.   MRN: [id number] Chief Complaint  Patient presents with  . Follow-up    Recheck for medication refill.    HPI Status post knee arthroscopy, recently requested pain medication refill. Patient was asked to come in to discuss as the patient's last visit showed that she had recovered from her knee arthroscopy. She's been taking hydrocodone 7.5 mg for increased knee pain and swelling. She has difficulties with her activities of daily living. She is now using a cane again.   Review of Systems She fell and has pain only in the knee. No weakness or giving out symptoms   Objective:   Physical Exam Ht 5\' 3"  (1.6 m)  Wt 233 lb (105.688 kg)  BMI 41.27 kg/m2 Again, she is ambulating with a cane. She has a limp. She has decreased range of motion in her RIGHT knee. The extensor mechanism is intact. The knee is stable. She is medial and patellofemoral tenderness pain with joint effusion. She has a normal neurovascular exam.    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 She will talk with her knees to make sure she has somebody to stay home with her after surgery. She will call us and we will schedule the surgery and preop visit after postoperative home care is confirmed

## 2012-06-03 ENCOUNTER — Other Ambulatory Visit: Payer: Self-pay | Admitting: *Deleted

## 2012-06-12 ENCOUNTER — Telehealth: Payer: Self-pay | Admitting: Orthopedic Surgery

## 2012-06-12 NOTE — Telephone Encounter (Signed)
Contacted insurer [employer] at [phone], regarding in-patient surgery scheduled on 07/01/12 at [facility], CPT [id number], ICD9 codes 715.16, 715.96.  Reached automated voice response system.  Entered all required information, including our call-back phone #.  ** Note ** Clariified with [employer] place of service "[facility]," not [place]. [facility], which was the general choice associated with our tax ID #.  Authorization based on information provided.  Received, per automated system:  Conf# [id number].  Also requested customer care specialist, and reached:  "Em M.", 06/12/12, 6:50p.m.  He verified that the surgery is approved; no clinicals required at this time.  We would be contacted by nurse reviewer if clinicals needed.  # of days approved: 1 day, until hospital provides discharge date.

## 2012-06-19 ENCOUNTER — Encounter (HOSPITAL_COMMUNITY): Payer: Self-pay

## 2012-06-20 NOTE — Telephone Encounter (Signed)
06/20/12 Called back, followed up with, [facility] [phone], confirmed same pre-auth# [id number]; spoke with Ram R, call ref# [id number].  No further clinicals needed.

## 2012-06-25 ENCOUNTER — Encounter (HOSPITAL_COMMUNITY)
Admission: RE | Admit: 2012-06-25 | Discharge: 2012-06-25 | Disposition: A | Discharge status: Home / Self Care | Payer: Medicare HMO | Source: Ambulatory Visit | Attending: Orthopedic Surgery | Admitting: Orthopedic Surgery

## 2012-06-25 ENCOUNTER — Encounter (HOSPITAL_COMMUNITY): Payer: Self-pay

## 2012-06-25 LAB — BASIC METABOLIC PANEL
BUN: 13 mg/dL (ref 6–23)
CO2: 30 mEq/L (ref 19–32)
Calcium: 9 mg/dL (ref 8.4–10.5)
Chloride: 104 mEq/L (ref 96–112)
Creatinine, Ser: 0.81 mg/dL (ref 0.50–1.10)
GFR calc Af Amer: 88 mL/min — ABNORMAL LOW (ref >90)
GFR calc non Af Amer: 76 mL/min — ABNORMAL LOW (ref >90)
Glucose, Bld: 87 mg/dL (ref 70–99)
Potassium: 3.8 mEq/L (ref 3.5–5.1)
Sodium: 142 mEq/L (ref 135–145)

## 2012-06-25 LAB — CBC WITH DIFFERENTIAL/PLATELET
Basophils Absolute: 0.1 10*3/uL (ref 0.0–0.1)
Basophils Relative: 1 % (ref 0–1)
Eosinophils Absolute: 0.2 10*3/uL (ref 0.0–0.7)
Eosinophils Relative: 4 % (ref 0–5)
HCT: 36.4 % (ref 36.0–46.0)
Hemoglobin: 11.9 g/dL — ABNORMAL LOW (ref 12.0–15.0)
Lymphocytes Relative: 57 % — ABNORMAL HIGH (ref 12–46)
Lymphs Abs: 2.9 10*3/uL (ref 0.7–4.0)
MCH: 28.3 pg (ref 26.0–34.0)
MCHC: 32.7 g/dL (ref 30.0–36.0)
MCV: 86.5 fL (ref 78.0–100.0)
Monocytes Absolute: 0.4 10*3/uL (ref 0.1–1.0)
Monocytes Relative: 8 % (ref 3–12)
Neutro Abs: 1.6 10*3/uL — ABNORMAL LOW (ref 1.7–7.7)
Neutrophils Relative %: 31 % — ABNORMAL LOW (ref 43–77)
Platelets: 185 10*3/uL (ref 150–400)
RBC: 4.21 MIL/uL (ref 3.87–5.11)
RDW: 13.5 % (ref 11.5–15.5)
WBC: 5 10*3/uL (ref 4.0–10.5)

## 2012-06-25 LAB — SURGICAL PCR SCREEN
MRSA, PCR: POSITIVE — AB
Staphylococcus aureus: POSITIVE — AB

## 2012-06-25 LAB — PROTIME-INR
INR: 1.02 (ref 0.00–1.49)
Prothrombin Time: 13.3 seconds (ref 11.6–15.2)

## 2012-06-25 LAB — ABO/RH: ABO/RH(D): O POS

## 2012-06-25 LAB — APTT: aPTT: 34 seconds (ref 24–37)

## 2012-06-25 LAB — PREPARE RBC (CROSSMATCH)

## 2012-06-25 NOTE — Patient Instructions (Addendum)
20 Diane [name]  06/25/2012   Your procedure is scheduled on:   07/01/2012  Report to [facility] at   615  AM.  Call this number if you have problems the morning of surgery: [phone]   Remember:   Do not eat food:After Midnight.  May have clear liquids:until Midnight .    Take these medicines the morning of surgery with A SIP OF WATER:  Xanax, clonodine,depakote,norco   Do not wear jewelry, make-up or nail polish.  Do not wear lotions, powders, or perfumes. You may wear deodorant.  Do not shave 48 hours prior to surgery. Men may shave face and neck.  Do not bring valuables to the hospital.  Contacts, dentures or bridgework may not be worn into surgery.  Leave suitcase in the car. After surgery it may be brought to your room.  For patients admitted to the hospital, checkout time is 11:00 AM the day of discharge.   Patients discharged the day of surgery will not be allowed to drive home.  Name and phone number of your driver: family  Special Instructions: Shower using CHG 2 nights before surgery and the night before surgery.  If you shower the day of surgery use CHG.  Use special wash - you have one bottle of CHG for all showers.  You should use approximately 1/3 of the bottle for each shower.   Please read over the following fact sheets that you were given: Pain Booklet, Coughing and Deep Breathing, Blood Transfusion Information, Total Joint Packet, MRSA Information, Surgical Site Infection Prevention, Anesthesia Post-op Instructions and Care and Recovery After Surgery Total Knee Replacement Total knee replacement is a procedure to replace your knee joint with an artificial knee joint (prosthetic knee joint). The purpose of this surgery is to reduce pain and improve your knee function. LET YOUR CAREGIVER KNOW ABOUT:   Any allergies you have.  Any medicines you are taking, including vitamins, herbs, eyedrops, over-the-counter medicines, and creams.  Any problems you have had  with the use of anesthetics.  Family history of problems with the use of anesthetics.  Any blood disorders you have, including bleeding problems or clotting problems.  Previous surgeries you have had. RISKS AND COMPLICATIONS  Generally, total knee replacement is a safe procedure. However, as with any surgical procedure, complications can occur. Possible complications associated with total knee replacement include:  Loss of range of motion of the knee or instability.  Loosening of the prosthesis.  Infection.  Persistent pain. BEFORE THE PROCEDURE   Your caregiver will instruct you when you need to stop eating and drinking.  Ask your caregiver if you need to change or stop any regular medicines. PROCEDURE  Just before the procedure you will receive medicine that will make you drowsy (sedative). This will be given through a tube that is inserted into one of your veins (intravenous [IV] tube). Then you will either receive medicine to block pain from the waist down through your legs (spinal block) or medicine to also receive medicine to make you fall asleep (general anesthetic). You may also receive medicine to block feeling in your leg (nerve block) to help ease pain after surgery. An incision will be made in your knee. Your surgeon will take out any damaged cartilage and bone by sawing off the damaged surfaces. Then the surgeon will put a new metal liner over the sawed off portion of your thigh bone (femur) and a plastic liner over the sawed off portion of  one of the bones of your lower leg (tibia). This is to restore alignment and function to your knee. A plastic piece is often used to restore the surface of your knee cap. AFTER THE PROCEDURE  You will be taken to the recovery area. You may have drainage tubes to drain excess fluid from your knee. These tubes attach to a device that removes these fluids. Once you are awake, stable, and taking fluids well, you will be taken to your hospital  room. You will receive physical therapy as prescribed by your caregiver. The length of your stay in the hospital after a knee replacement is 2 4 days. Your surgeon may recommend that you spend time (usually an additional 10 14 days) in an extended-care facility to help you begin walking again and improve your range of motion before you go home. You may also be prescribed blood-thinning medicine to decrease your risk of developing blood clots in your leg. Document Released: 12/04/2000 Document Revised: 02/27/2012 Document Reviewed: 10/08/2011 [facility] Patient Information 2013 [place], [place]. PATIENT INSTRUCTIONS POST-ANESTHESIA  IMMEDIATELY FOLLOWING SURGERY:  Do not drive or operate machinery for the first twenty four hours after surgery.  Do not make any important decisions for twenty four hours after surgery or while taking narcotic pain medications or sedatives.  If you develop intractable nausea and vomiting or a severe headache please notify your doctor immediately.  FOLLOW-UP:  Please make an appointment with your surgeon as instructed. You do not need to follow up with anesthesia unless specifically instructed to do so.  WOUND CARE INSTRUCTIONS (if applicable):  Keep a dry clean dressing on the anesthesia/puncture wound site if there is drainage.  Once the wound has quit draining you may leave it open to air.  Generally you should leave the bandage intact for twenty four hours unless there is drainage.  If the epidural site drains for more than 36-48 hours please call the anesthesia department.  QUESTIONS?:  Please feel free to call your physician or the hospital operator if you have any questions, and they will be happy to assist you.

## 2012-06-26 ENCOUNTER — Other Ambulatory Visit: Payer: Self-pay | Admitting: Orthopedic Surgery

## 2012-06-27 NOTE — H&P (Addendum)
TOTAL KNEE ADMISSION H&P  Patient is being admitted for right total knee arthroplasty.  Subjective:  Chief Complaint:right knee pain.  HPI: Diane [name], 61 y.o. female, has a history of pain and functional disability in the right knee due to arthritis and has failed non-surgical conservative treatments for greater than 12 weeks to includeNSAID's and/or analgesics, corticosteriod injections, flexibility and strengthening excercises, use of assistive devices and activity modification.  Onset of symptoms was gradual, starting 2 years ago with gradually worsening course since that time. The patient noted prior procedures on the knee to include  arthroscopy on the right knee(s).  Patient currently rates pain in the right knee(s) at 7 out of 10 with activity. Patient has night pain, worsening of pain with activity and weight bearing, pain that interferes with activities of daily living, pain with passive range of motion, crepitus and joint swelling.  Patient has evidence of subchondral cysts, subchondral sclerosis, periarticular osteophytes and joint space narrowing by imaging studies. This patient has had FAILURE OF ARTHROSCOPIC SURGERY . There is no active infection.  Patient Active Problem List   Diagnosis Date Noted  . Degenerative tear of medial meniscus of right knee 01/12/2012  . OA (osteoarthritis) of knee 01/12/2012  . Right knee sprain 11/02/2011  . Instability of knee joint 03/29/2011  . SINUSITIS, ACUTE 12/21/2009  . URTICARIA 12/21/2009  . ACUTE BRONCHITIS 12/14/2009  . OTHER URINARY INCONTINENCE 07/18/2009  . FOOT PAIN, LEFT 05/26/2009  . ACUTE CYSTITIS 03/10/2009  . DERMATITIS, CHRONIC 10/21/2008  . IMPAIRED GLUCOSE TOLERANCE 09/09/2008  . BACK PAIN WITH RADICULOPATHY 09/09/2008  . HYPOTHYROIDISM 02/27/2008  . HYPERLIPIDEMIA 02/27/2008  . ANXIETY 02/27/2008  . DEPRESSION 02/27/2008  . HYPERTENSION 02/27/2008  . ALLERGIC RHINITIS 02/27/2008  . GERD 02/27/2008  . HEADACHE  02/27/2008   Past Medical History  Diagnosis Date  . Hypertension   . Allergic rhinitis   . Anxiety   . Depression   . GERD (gastroesophageal reflux disease)   . High cholesterol   . Hypothyroid   . Chronic back pain   . Arthritis   . Immature cataract   . Sleep apnea     STOP BANG score=4  . Seizures 20 yrs ago    unknown etiology-on meds  . Headaches, cluster 3 yrs ago    last one    Past Surgical History  Procedure Date  . Left foot x 2  . Foot surgery     left foot-  . Partial hysterectomy   . Oophoectomy bilateral  . Cholecystectomy   . Lumbar disc surgery L4-5  . Back surgery   . Knee arthroscopy 01/2012    right    No prescriptions prior to admission   Allergies  Allergen Reactions  . Neomycin-Bacitracin Zn-Polymyx Other (See Comments)    Reaction: skin starts to become raw and peels off  . Penicillins Hives    REACTION: hives  . Sulfonamide Derivatives Hives and Swelling    History  Substance Use Topics  . Smoking status: Former Smoker -- 0.2 packs/day for 5 years    Types: Cigarettes    Quit date: 06/26/2003  . Smokeless tobacco: Not on file  . Alcohol Use: Yes     occasionally    Family History  Problem Relation Age of Onset  . Heart disease    . Diabetes    . Alcohol abuse       ROS  Objective:  Physical Exam  Nursing note and vitals reviewed. Constitutional: She is  oriented to person, place, and time. She appears well-developed and well-nourished. No distress.  HENT:  Head: Normocephalic.  Eyes: Pupils are equal, round, and reactive to light.  Neck: Normal range of motion.  Cardiovascular: Normal rate and intact distal pulses.   Respiratory: Effort normal and breath sounds normal. No respiratory distress.  GI: Soft. She exhibits no distension.  Musculoskeletal:       Upper extremity exam  Inspection and palpation revealed no abnormalities in the upper extremities.  Range of motion is full without contracture.  Motor exam is  normal with grade 5 strength.  The joints are fully reduced without subluxation.  There is no atrophy or tremor and muscle tone is normal.  All joints are stable.    Lower extremity exam  Ambulation is SUPPORTED BY A CANE LEFT KNEE Inspection and palpation revealed tenderness AND abnormality in alignment. Range of motion is 120 DEGREES, Strength is grade 5. LIGAMENTS ARE STABLE   Right knee is noted to have medial joint space narrowing tenderness loss of range of motion swelling. Strength remains intact a grade 5. Ligaments are stable.       Lymphadenopathy:    She has no cervical adenopathy.  Neurological: She is alert and oriented to person, place, and time. She has normal reflexes. She displays normal reflexes. No cranial nerve deficit. She exhibits abnormal muscle tone. Coordination abnormal.  Skin: Skin is warm and dry. No rash noted. She is not diaphoretic. No erythema. No pallor.  Psychiatric: She has a normal mood and affect. Her behavior is normal. Judgment and thought content normal.    Vital signs in last 24 hours:    Labs:   Estimated Body mass index is 41.27 kg/(m^2) as calculated from the following:   Height as of 05/28/12: 5\' 3" (1.6 m).   Weight as of 05/28/12: 233 lb(105.688 kg).   Imaging Review Plain radiographs demonstrate severe degenerative joint disease of the right knee(s). The overall alignment ismild varus. The bone quality appears to be good for age and reported activity level.  Assessment/Plan:  End stage arthritis, right knee   The patient history, physical examination, clinical judgment of the provider and imaging studies are consistent with end stage degenerative joint disease of the right knee(s) and total knee arthroplasty is deemed medically necessary. The treatment options including medical management, injection therapy arthroscopy and arthroplasty were discussed at length. The risks and benefits of total knee arthroplasty were presented and  reviewed. The risks due to aseptic loosening, infection, stiffness, patella tracking problems, thromboembolic complications and other imponderables were discussed. The patient acknowledged the explanation, agreed to proceed with the plan and consent was signed. Patient is being admitted for inpatient treatment for surgery, pain control, PT, OT, prophylactic antibiotics, VTE prophylaxis, progressive ambulation and ADL's and discharge planning. The patient is planning to be discharged home with home health services

## 2012-07-01 ENCOUNTER — Inpatient Hospital Stay (HOSPITAL_COMMUNITY)
Admission: RE | Admit: 2012-07-01 | Discharge: 2012-07-04 | DRG: 470 | Disposition: A | Discharge status: Skilled Nursing Facility | Payer: Medicare HMO | Source: Ambulatory Visit | Attending: Orthopedic Surgery | Admitting: Orthopedic Surgery

## 2012-07-01 ENCOUNTER — Encounter (HOSPITAL_COMMUNITY): Payer: Self-pay | Admitting: Anesthesiology

## 2012-07-01 ENCOUNTER — Encounter (HOSPITAL_COMMUNITY): Payer: Self-pay | Admitting: *Deleted

## 2012-07-01 ENCOUNTER — Inpatient Hospital Stay (HOSPITAL_COMMUNITY): Payer: Medicare HMO | Admitting: Anesthesiology

## 2012-07-01 ENCOUNTER — Encounter (HOSPITAL_COMMUNITY)
Admission: RE | Disposition: A | Discharge status: Skilled Nursing Facility | Payer: Self-pay | Source: Ambulatory Visit | Attending: Orthopedic Surgery

## 2012-07-01 ENCOUNTER — Inpatient Hospital Stay (HOSPITAL_COMMUNITY): Payer: Medicare HMO

## 2012-07-01 DIAGNOSIS — F3289 Other specified depressive episodes: Secondary | ICD-10-CM | POA: Diagnosis present

## 2012-07-01 DIAGNOSIS — M549 Dorsalgia, unspecified: Secondary | ICD-10-CM | POA: Diagnosis present

## 2012-07-01 DIAGNOSIS — Z87891 Personal history of nicotine dependence: Secondary | ICD-10-CM

## 2012-07-01 DIAGNOSIS — E7800 Pure hypercholesterolemia, unspecified: Secondary | ICD-10-CM | POA: Diagnosis present

## 2012-07-01 DIAGNOSIS — G8929 Other chronic pain: Secondary | ICD-10-CM | POA: Diagnosis present

## 2012-07-01 DIAGNOSIS — F39 Unspecified mood [affective] disorder: Secondary | ICD-10-CM

## 2012-07-01 DIAGNOSIS — Z880 Allergy status to penicillin: Secondary | ICD-10-CM

## 2012-07-01 DIAGNOSIS — F411 Generalized anxiety disorder: Secondary | ICD-10-CM | POA: Diagnosis present

## 2012-07-01 DIAGNOSIS — M129 Arthropathy, unspecified: Secondary | ICD-10-CM | POA: Diagnosis present

## 2012-07-01 DIAGNOSIS — Z882 Allergy status to sulfonamides status: Secondary | ICD-10-CM

## 2012-07-01 DIAGNOSIS — Z881 Allergy status to other antibiotic agents status: Secondary | ICD-10-CM

## 2012-07-01 DIAGNOSIS — I10 Essential (primary) hypertension: Secondary | ICD-10-CM | POA: Diagnosis present

## 2012-07-01 DIAGNOSIS — M1710 Unilateral primary osteoarthritis, unspecified knee: Principal | ICD-10-CM

## 2012-07-01 DIAGNOSIS — E039 Hypothyroidism, unspecified: Secondary | ICD-10-CM | POA: Diagnosis present

## 2012-07-01 DIAGNOSIS — G40909 Epilepsy, unspecified, not intractable, without status epilepticus: Secondary | ICD-10-CM | POA: Diagnosis present

## 2012-07-01 DIAGNOSIS — IMO0002 Reserved for concepts with insufficient information to code with codable children: Secondary | ICD-10-CM

## 2012-07-01 DIAGNOSIS — Z6839 Body mass index (BMI) 39.0-39.9, adult: Secondary | ICD-10-CM

## 2012-07-01 DIAGNOSIS — G4730 Sleep apnea, unspecified: Secondary | ICD-10-CM | POA: Diagnosis present

## 2012-07-01 DIAGNOSIS — F329 Major depressive disorder, single episode, unspecified: Secondary | ICD-10-CM | POA: Diagnosis present

## 2012-07-01 DIAGNOSIS — K219 Gastro-esophageal reflux disease without esophagitis: Secondary | ICD-10-CM | POA: Diagnosis present

## 2012-07-01 DIAGNOSIS — Z9079 Acquired absence of other genital organ(s): Secondary | ICD-10-CM

## 2012-07-01 HISTORY — PX: TOTAL KNEE ARTHROPLASTY: SHX125

## 2012-07-01 SURGERY — ARTHROPLASTY, KNEE, TOTAL
Anesthesia: General | Site: Knee | Laterality: Right | Wound class: Clean

## 2012-07-01 MED ORDER — DIVALPROEX SODIUM 500 MG PO DR TAB
750.0000 mg | DELAYED_RELEASE_TABLET | Freq: Every day | ORAL | Status: DC
  Filled 2012-07-01 (×2): qty 1

## 2012-07-01 MED ORDER — NALOXONE HCL 0.4 MG/ML IJ SOLN
INTRAMUSCULAR | Status: DC | PRN
  Administered 2012-07-01 (×2): 50 ug via INTRAVENOUS

## 2012-07-01 MED ORDER — NEOSTIGMINE METHYLSULFATE 1 MG/ML IJ SOLN
INTRAMUSCULAR | Status: AC
  Filled 2012-07-01: qty 10

## 2012-07-01 MED ORDER — CHLORHEXIDINE GLUCONATE 4 % EX LIQD: 60.0000 mL | Freq: Once | CUTANEOUS | Status: DC

## 2012-07-01 MED ORDER — LACTATED RINGERS IV SOLN
INTRAVENOUS | Status: DC | PRN
  Administered 2012-07-01 (×2): via INTRAVENOUS

## 2012-07-01 MED ORDER — LEVOTHYROXINE SODIUM 75 MCG PO TABS
150.0000 ug | ORAL_TABLET | Freq: Every day | ORAL | Status: DC
  Administered 2012-07-02 – 2012-07-04 (×3): 150 ug via ORAL
  Filled 2012-07-01: qty 1
  Filled 2012-07-01: qty 2
  Filled 2012-07-01 (×3): qty 1

## 2012-07-01 MED ORDER — EPHEDRINE SULFATE 50 MG/ML IJ SOLN
INTRAMUSCULAR | Status: AC
  Filled 2012-07-01: qty 1

## 2012-07-01 MED ORDER — MIDAZOLAM HCL 2 MG/2ML IJ SOLN
1.0000 mg | INTRAMUSCULAR | Status: DC | PRN
  Administered 2012-07-01: 2 mg via INTRAVENOUS

## 2012-07-01 MED ORDER — ALPRAZOLAM 0.5 MG PO TABS
0.5000 mg | ORAL_TABLET | Freq: Two times a day (BID) | ORAL | Status: DC
  Administered 2012-07-01 – 2012-07-04 (×6): 0.5 mg via ORAL
  Filled 2012-07-01 (×6): qty 1

## 2012-07-01 MED ORDER — ONDANSETRON HCL 4 MG PO TABS: 4.0000 mg | ORAL_TABLET | Freq: Four times a day (QID) | ORAL | Status: DC | PRN

## 2012-07-01 MED ORDER — ROCURONIUM BROMIDE 50 MG/5ML IV SOLN
INTRAVENOUS | Status: AC
  Filled 2012-07-01: qty 1

## 2012-07-01 MED ORDER — PROPOFOL 10 MG/ML IV EMUL
INTRAVENOUS | Status: DC | PRN
  Administered 2012-07-01: 150 mg via INTRAVENOUS

## 2012-07-01 MED ORDER — METOCLOPRAMIDE HCL 5 MG/ML IJ SOLN: 5.0000 mg | Freq: Three times a day (TID) | INTRAMUSCULAR | Status: DC | PRN

## 2012-07-01 MED ORDER — HYDROMORPHONE HCL PF 1 MG/ML IJ SOLN
INTRAMUSCULAR | Status: AC
  Filled 2012-07-01: qty 1

## 2012-07-01 MED ORDER — ACETAMINOPHEN 10 MG/ML IV SOLN
1000.0000 mg | Freq: Four times a day (QID) | INTRAVENOUS | Status: AC
  Administered 2012-07-01 – 2012-07-02 (×3): 1000 mg via INTRAVENOUS
  Filled 2012-07-01 (×3): qty 100

## 2012-07-01 MED ORDER — MIDAZOLAM HCL 2 MG/2ML IJ SOLN
INTRAMUSCULAR | Status: AC
  Filled 2012-07-01: qty 2

## 2012-07-01 MED ORDER — OXYCODONE HCL 5 MG PO TABS
ORAL_TABLET | ORAL | Status: AC
  Filled 2012-07-01: qty 1

## 2012-07-01 MED ORDER — ONDANSETRON HCL 4 MG/2ML IJ SOLN: 4.0000 mg | Freq: Four times a day (QID) | INTRAMUSCULAR | Status: DC | PRN

## 2012-07-01 MED ORDER — MAGNESIUM CITRATE PO SOLN: 1.0000 | Freq: Once | ORAL | Status: AC | PRN

## 2012-07-01 MED ORDER — MAGNESIUM HYDROXIDE 400 MG/5ML PO SUSP: 30.0000 mL | Freq: Every day | ORAL | Status: DC | PRN

## 2012-07-01 MED ORDER — DOCUSATE SODIUM 100 MG PO CAPS
100.0000 mg | ORAL_CAPSULE | Freq: Two times a day (BID) | ORAL | Status: DC
  Administered 2012-07-01 – 2012-07-04 (×6): 100 mg via ORAL
  Filled 2012-07-01 (×6): qty 1

## 2012-07-01 MED ORDER — ONDANSETRON HCL 4 MG/2ML IJ SOLN: 4.0000 mg | Freq: Once | INTRAMUSCULAR | Status: DC | PRN

## 2012-07-01 MED ORDER — LIDOCAINE HCL (CARDIAC) 10 MG/ML IV SOLN
INTRAVENOUS | Status: DC | PRN
  Administered 2012-07-01: 50 mg via INTRAVENOUS

## 2012-07-01 MED ORDER — METOCLOPRAMIDE HCL 10 MG PO TABS: 5.0000 mg | ORAL_TABLET | Freq: Three times a day (TID) | ORAL | Status: DC | PRN

## 2012-07-01 MED ORDER — NITROFURANTOIN MONOHYD MACRO 100 MG PO CAPS
100.0000 mg | ORAL_CAPSULE | Freq: Two times a day (BID) | ORAL | Status: DC
  Administered 2012-07-02 – 2012-07-04 (×5): 100 mg via ORAL
  Filled 2012-07-01 (×5): qty 1

## 2012-07-01 MED ORDER — METHOCARBAMOL 100 MG/ML IJ SOLN
500.0000 mg | Freq: Once | INTRAVENOUS | Status: AC
  Administered 2012-07-01: 500 mg via INTRAVENOUS
  Filled 2012-07-01: qty 5

## 2012-07-01 MED ORDER — LORATADINE 10 MG PO TABS
10.0000 mg | ORAL_TABLET | Freq: Every day | ORAL | Status: DC
  Administered 2012-07-02 – 2012-07-04 (×3): 10 mg via ORAL
  Filled 2012-07-01 (×3): qty 1

## 2012-07-01 MED ORDER — MUPIROCIN 2 % EX OINT
1.0000 "application " | TOPICAL_OINTMENT | Freq: Two times a day (BID) | CUTANEOUS | Status: DC
  Administered 2012-07-01 – 2012-07-04 (×7): 1 via NASAL
  Filled 2012-07-01: qty 22

## 2012-07-01 MED ORDER — BACTERIOSTATIC WATER(BENZ ALC) IJ SOLN
INTRAMUSCULAR | Status: AC
  Filled 2012-07-01: qty 30

## 2012-07-01 MED ORDER — LACTATED RINGERS IV SOLN
INTRAVENOUS | Status: DC
  Administered 2012-07-01: 07:00:00 via INTRAVENOUS

## 2012-07-01 MED ORDER — FENTANYL CITRATE 0.05 MG/ML IJ SOLN
INTRAMUSCULAR | Status: AC
  Filled 2012-07-01: qty 2

## 2012-07-01 MED ORDER — HYDROMORPHONE HCL PF 1 MG/ML IJ SOLN
0.2500 mg | INTRAMUSCULAR | Status: DC | PRN
  Administered 2012-07-01 (×4): 0.5 mg via INTRAVENOUS
  Administered 2012-07-01: 1 mg via INTRAVENOUS

## 2012-07-01 MED ORDER — BISACODYL 10 MG RE SUPP: 10.0000 mg | Freq: Every day | RECTAL | Status: DC | PRN

## 2012-07-01 MED ORDER — BUPIVACAINE IN DEXTROSE 0.75-8.25 % IT SOLN
INTRATHECAL | Status: AC
  Filled 2012-07-01: qty 2

## 2012-07-01 MED ORDER — VANCOMYCIN HCL IN DEXTROSE 1-5 GM/200ML-% IV SOLN
INTRAVENOUS | Status: AC
  Filled 2012-07-01: qty 200

## 2012-07-01 MED ORDER — SODIUM CHLORIDE 0.9 % IJ SOLN
INTRAMUSCULAR | Status: AC
  Filled 2012-07-01: qty 10

## 2012-07-01 MED ORDER — SODIUM CHLORIDE 0.9 % IV SOLN
INTRAVENOUS | Status: DC
  Administered 2012-07-01: 14:00:00 via INTRAVENOUS

## 2012-07-01 MED ORDER — DIPHENHYDRAMINE HCL 12.5 MG/5ML PO ELIX: 12.5000 mg | ORAL_SOLUTION | ORAL | Status: DC | PRN

## 2012-07-01 MED ORDER — FENTANYL CITRATE 0.05 MG/ML IJ SOLN
INTRAMUSCULAR | Status: DC | PRN
  Administered 2012-07-01: 50 ug via INTRAVENOUS
  Administered 2012-07-01 (×5): 100 ug via INTRAVENOUS
  Administered 2012-07-01: 50 ug via INTRAVENOUS
  Administered 2012-07-01: 100 ug via INTRAVENOUS

## 2012-07-01 MED ORDER — ONDANSETRON HCL 4 MG/2ML IJ SOLN
INTRAMUSCULAR | Status: AC
  Filled 2012-07-01: qty 2

## 2012-07-01 MED ORDER — ACETAMINOPHEN 325 MG PO TABS: 650.0000 mg | ORAL_TABLET | Freq: Four times a day (QID) | ORAL | Status: DC | PRN

## 2012-07-01 MED ORDER — VANCOMYCIN HCL 1000 MG IV SOLR
1000.0000 mg | INTRAVENOUS | Status: DC | PRN
  Administered 2012-07-01: 1500 mg via INTRAVENOUS

## 2012-07-01 MED ORDER — KETOROLAC TROMETHAMINE 30 MG/ML IJ SOLN
INTRAMUSCULAR | Status: AC
  Filled 2012-07-01: qty 1

## 2012-07-01 MED ORDER — NALOXONE HCL 0.4 MG/ML IJ SOLN
40.0000 ug | INTRAMUSCULAR | Status: DC | PRN
  Administered 2012-07-01 (×2): 40 ug via INTRAVENOUS

## 2012-07-01 MED ORDER — ROCURONIUM BROMIDE 100 MG/10ML IV SOLN
INTRAVENOUS | Status: DC | PRN
  Administered 2012-07-01: 20 mg via INTRAVENOUS

## 2012-07-01 MED ORDER — MENTHOL 3 MG MT LOZG
1.0000 | LOZENGE | OROMUCOSAL | Status: DC | PRN
  Filled 2012-07-01: qty 9

## 2012-07-01 MED ORDER — ARTIFICIAL TEARS OP OINT
TOPICAL_OINTMENT | OPHTHALMIC | Status: AC
  Filled 2012-07-01: qty 3.5

## 2012-07-01 MED ORDER — OXYCODONE HCL 5 MG PO TABS: 5.0000 mg | ORAL_TABLET | Freq: Once | ORAL | Status: DC

## 2012-07-01 MED ORDER — BUPIVACAINE-EPINEPHRINE (PF) 0.5% -1:200000 IJ SOLN
INTRAMUSCULAR | Status: DC | PRN
  Administered 2012-07-01: 90 mL

## 2012-07-01 MED ORDER — VANCOMYCIN HCL 1000 MG IV SOLR
1500.0000 mg | Freq: Three times a day (TID) | INTRAVENOUS | Status: AC
  Administered 2012-07-01 – 2012-07-02 (×2): 1500 mg via INTRAVENOUS
  Filled 2012-07-01 (×2): qty 1500

## 2012-07-01 MED ORDER — OXYBUTYNIN CHLORIDE 5 MG PO TABS
5.0000 mg | ORAL_TABLET | Freq: Two times a day (BID) | ORAL | Status: DC
  Administered 2012-07-01 – 2012-07-04 (×6): 5 mg via ORAL
  Filled 2012-07-01 (×6): qty 1

## 2012-07-01 MED ORDER — NALOXONE HCL 0.4 MG/ML IJ SOLN
INTRAMUSCULAR | Status: AC
  Filled 2012-07-01: qty 1

## 2012-07-01 MED ORDER — GLYCOPYRROLATE 0.2 MG/ML IJ SOLN
INTRAMUSCULAR | Status: AC
  Filled 2012-07-01: qty 3

## 2012-07-01 MED ORDER — CLONIDINE HCL 0.2 MG PO TABS
0.2000 mg | ORAL_TABLET | Freq: Every day | ORAL | Status: DC
  Administered 2012-07-02 – 2012-07-03 (×2): 0.2 mg via ORAL
  Filled 2012-07-01 (×2): qty 1

## 2012-07-01 MED ORDER — CHLORHEXIDINE GLUCONATE CLOTH 2 % EX PADS
6.0000 | MEDICATED_PAD | Freq: Every day | CUTANEOUS | Status: DC
  Administered 2012-07-02 – 2012-07-04 (×3): 6 via TOPICAL

## 2012-07-01 MED ORDER — ACETAMINOPHEN 10 MG/ML IV SOLN
INTRAVENOUS | Status: AC
  Filled 2012-07-01: qty 100

## 2012-07-01 MED ORDER — OXYCODONE HCL 5 MG PO TABS
5.0000 mg | ORAL_TABLET | ORAL | Status: DC
  Administered 2012-07-01 – 2012-07-02 (×2): 10 mg via ORAL
  Filled 2012-07-01 (×2): qty 2

## 2012-07-01 MED ORDER — VANCOMYCIN HCL 500 MG IV SOLR
500.0000 mg | Freq: Once | INTRAVENOUS | Status: DC
  Filled 2012-07-01: qty 500

## 2012-07-01 MED ORDER — ACETAMINOPHEN 10 MG/ML IV SOLN
1000.0000 mg | Freq: Once | INTRAVENOUS | Status: AC
  Administered 2012-07-01: 1000 mg via INTRAVENOUS

## 2012-07-01 MED ORDER — SENNA 8.6 MG PO TABS
1.0000 | ORAL_TABLET | Freq: Two times a day (BID) | ORAL | Status: DC
  Administered 2012-07-01 – 2012-07-04 (×6): 8.6 mg via ORAL
  Filled 2012-07-01 (×6): qty 1

## 2012-07-01 MED ORDER — HYDROMORPHONE HCL PF 1 MG/ML IJ SOLN
0.5000 mg | INTRAMUSCULAR | Status: DC | PRN
  Administered 2012-07-02 (×3): 0.5 mg via INTRAVENOUS
  Filled 2012-07-01 (×4): qty 1

## 2012-07-01 MED ORDER — DIVALPROEX SODIUM 250 MG PO DR TAB
750.0000 mg | DELAYED_RELEASE_TABLET | Freq: Every day | ORAL | Status: DC
  Administered 2012-07-01 – 2012-07-03 (×3): 750 mg via ORAL
  Filled 2012-07-01 (×3): qty 3

## 2012-07-01 MED ORDER — ASPIRIN EC 325 MG PO TBEC
325.0000 mg | DELAYED_RELEASE_TABLET | Freq: Two times a day (BID) | ORAL | Status: DC
  Administered 2012-07-01 – 2012-07-04 (×6): 325 mg via ORAL
  Filled 2012-07-01 (×6): qty 1

## 2012-07-01 MED ORDER — KETOROLAC TROMETHAMINE 30 MG/ML IJ SOLN
30.0000 mg | Freq: Once | INTRAMUSCULAR | Status: AC
  Administered 2012-07-01: 30 mg via INTRAVENOUS

## 2012-07-01 MED ORDER — EPHEDRINE SULFATE 50 MG/ML IJ SOLN
INTRAMUSCULAR | Status: DC | PRN
  Administered 2012-07-01: 5 mg via INTRAVENOUS
  Administered 2012-07-01: 10 mg via INTRAVENOUS

## 2012-07-01 MED ORDER — VANCOMYCIN HCL 500 MG IV SOLR: 500.0000 mg | Freq: Once | INTRAVENOUS | Status: DC

## 2012-07-01 MED ORDER — ATORVASTATIN CALCIUM 40 MG PO TABS
80.0000 mg | ORAL_TABLET | Freq: Every day | ORAL | Status: DC
  Administered 2012-07-02 – 2012-07-03 (×2): 80 mg via ORAL
  Filled 2012-07-01: qty 1
  Filled 2012-07-01 (×2): qty 2
  Filled 2012-07-01: qty 1

## 2012-07-01 MED ORDER — SUCCINYLCHOLINE CHLORIDE 20 MG/ML IJ SOLN
INTRAMUSCULAR | Status: AC
  Filled 2012-07-01: qty 1

## 2012-07-01 MED ORDER — PROPOFOL 10 MG/ML IV EMUL
INTRAVENOUS | Status: AC
  Filled 2012-07-01: qty 20

## 2012-07-01 MED ORDER — ACETAMINOPHEN 650 MG RE SUPP: 650.0000 mg | Freq: Four times a day (QID) | RECTAL | Status: DC | PRN

## 2012-07-01 MED ORDER — PREGABALIN 50 MG PO CAPS
ORAL_CAPSULE | ORAL | Status: AC
  Filled 2012-07-01: qty 1

## 2012-07-01 MED ORDER — SUCCINYLCHOLINE CHLORIDE 20 MG/ML IJ SOLN
INTRAMUSCULAR | Status: DC | PRN
  Administered 2012-07-01: 100 mg via INTRAVENOUS

## 2012-07-01 MED ORDER — DEXTROSE IN LACTATED RINGERS 5 % IV SOLN: INTRAVENOUS | Status: DC | PRN

## 2012-07-01 MED ORDER — AMITRIPTYLINE HCL 25 MG PO TABS
50.0000 mg | ORAL_TABLET | Freq: Every day | ORAL | Status: DC
  Administered 2012-07-02 – 2012-07-03 (×2): 50 mg via ORAL
  Filled 2012-07-01 (×2): qty 2

## 2012-07-01 MED ORDER — PHENOL 1.4 % MT LIQD
1.0000 | OROMUCOSAL | Status: DC | PRN
  Filled 2012-07-01: qty 177

## 2012-07-01 MED ORDER — ONDANSETRON HCL 4 MG/2ML IJ SOLN
4.0000 mg | Freq: Once | INTRAMUSCULAR | Status: AC
  Administered 2012-07-01: 4 mg via INTRAVENOUS

## 2012-07-01 MED ORDER — PREGABALIN 50 MG PO CAPS: 50.0000 mg | ORAL_CAPSULE | Freq: Once | ORAL | Status: DC

## 2012-07-01 MED ORDER — BUPIVACAINE-EPINEPHRINE PF 0.5-1:200000 % IJ SOLN
INTRAMUSCULAR | Status: AC
  Filled 2012-07-01: qty 20

## 2012-07-01 MED ORDER — ALUM & MAG HYDROXIDE-SIMETH 200-200-20 MG/5ML PO SUSP: 30.0000 mL | ORAL | Status: DC | PRN

## 2012-07-01 MED ORDER — VANCOMYCIN HCL IN DEXTROSE 1-5 GM/200ML-% IV SOLN: 1000.0000 mg | INTRAVENOUS | Status: DC

## 2012-07-01 MED ORDER — LIDOCAINE HCL (PF) 1 % IJ SOLN
INTRAMUSCULAR | Status: AC
  Filled 2012-07-01: qty 5

## 2012-07-01 MED ORDER — CYCLOBENZAPRINE HCL 10 MG PO TABS
10.0000 mg | ORAL_TABLET | Freq: Three times a day (TID) | ORAL | Status: DC
  Administered 2012-07-01 – 2012-07-04 (×8): 10 mg via ORAL
  Filled 2012-07-01 (×8): qty 1

## 2012-07-01 MED ORDER — MIDAZOLAM HCL 5 MG/5ML IJ SOLN
INTRAMUSCULAR | Status: DC | PRN
  Administered 2012-07-01: 2 mg via INTRAVENOUS

## 2012-07-01 MED ORDER — KETOROLAC TROMETHAMINE 30 MG/ML IJ SOLN
15.0000 mg | Freq: Four times a day (QID) | INTRAMUSCULAR | Status: AC
  Administered 2012-07-01 – 2012-07-02 (×3): 15 mg via INTRAVENOUS
  Filled 2012-07-01 (×7): qty 1

## 2012-07-01 MED ORDER — SODIUM CHLORIDE 0.9 % IR SOLN
Status: DC | PRN
  Administered 2012-07-01: 3000 mL

## 2012-07-01 SURGICAL SUPPLY — 65 items
BAG HAMPER (MISCELLANEOUS) ×2 IMPLANT
BANDAGE ELASTIC 4 VELCRO NS (GAUZE/BANDAGES/DRESSINGS) ×2 IMPLANT
BANDAGE ELASTIC 6 VELCRO NS (GAUZE/BANDAGES/DRESSINGS) ×2 IMPLANT
BANDAGE ESMARK 6X9 LF (GAUZE/BANDAGES/DRESSINGS) ×1 IMPLANT
BIT DRILL 3.2X128 (BIT) ×2 IMPLANT
BLADE HEX COATED 2.75 (ELECTRODE) ×2 IMPLANT
BLADE SAG 18X100X1.27 (BLADE) ×2 IMPLANT
BLADE SAGITTAL 25.0X1.27X90 (BLADE) ×2 IMPLANT
BLADE SAW SAG 90X13X1.27 (BLADE) ×2 IMPLANT
BNDG ESMARK 6X9 LF (GAUZE/BANDAGES/DRESSINGS) ×2
BOWL SMART MIX CTS (DISPOSABLE) IMPLANT
CEMENT HV SMART SET (Cement) ×4 IMPLANT
CLOTH BEACON ORANGE TIMEOUT ST (SAFETY) ×2 IMPLANT
COVER LIGHT HANDLE STERIS (MISCELLANEOUS) ×4 IMPLANT
COVER PROBE W GEL 5X96 (DRAPES) ×2 IMPLANT
CUFF TOURNIQUET SINGLE 34IN LL (TOURNIQUET CUFF) IMPLANT
CUFF TOURNIQUET SINGLE 44IN (TOURNIQUET CUFF) ×2 IMPLANT
DECANTER SPIKE VIAL GLASS SM (MISCELLANEOUS) ×6 IMPLANT
DRAIN TROCAR  MED 1/8 (DRAIN) ×2 IMPLANT
DRAPE BACK TABLE (DRAPES) ×2 IMPLANT
DRAPE EXTREMITY T 121X128X90 (DRAPE) ×2 IMPLANT
DRAPE U-SHAPE 47X51 STRL (DRAPES) ×2 IMPLANT
DRSG MEPILEX BORDER 4X12 (GAUZE/BANDAGES/DRESSINGS) ×2 IMPLANT
DURAPREP 26ML APPLICATOR (WOUND CARE) ×4 IMPLANT
ELECT REM PT RETURN 9FT ADLT (ELECTROSURGICAL) ×2
ELECTRODE REM PT RTRN 9FT ADLT (ELECTROSURGICAL) ×1 IMPLANT
FACESHIELD LNG OPTICON STERILE (SAFETY) ×2 IMPLANT
GLOVE BIOGEL PI IND STRL 7.0 (GLOVE) ×2 IMPLANT
GLOVE BIOGEL PI INDICATOR 7.0 (GLOVE) ×2
GLOVE ECLIPSE 6.5 STRL STRAW (GLOVE) ×4 IMPLANT
GLOVE EXAM NITRILE MD LF STRL (GLOVE) ×2 IMPLANT
GLOVE OPTIFIT SS 8.0 STRL (GLOVE) ×2 IMPLANT
GLOVE SKINSENSE NS SZ8.0 LF (GLOVE) ×2
GLOVE SKINSENSE STRL SZ8.0 LF (GLOVE) ×2 IMPLANT
GLOVE SS N UNI LF 8.5 STRL (GLOVE) ×2 IMPLANT
GOWN STRL REIN XL XLG (GOWN DISPOSABLE) ×8 IMPLANT
HANDPIECE INTERPULSE COAX TIP (DISPOSABLE) ×1
HOOD W/PEELAWAY (MISCELLANEOUS) ×8 IMPLANT
INSERT STABILIZED 10MM (Knees) ×2 IMPLANT
INST SET MAJOR BONE (KITS) ×2 IMPLANT
IV NS IRRIG 3000ML ARTHROMATIC (IV SOLUTION) ×2 IMPLANT
KIT BLADEGUARD II DBL (SET/KITS/TRAYS/PACK) ×2 IMPLANT
KIT ROOM TURNOVER APOR (KITS) ×2 IMPLANT
MANIFOLD NEPTUNE II (INSTRUMENTS) ×2 IMPLANT
MARKER SKIN DUAL TIP RULER LAB (MISCELLANEOUS) ×2 IMPLANT
NEEDLE HYPO 21X1.5 SAFETY (NEEDLE) ×2 IMPLANT
NS IRRIG 1000ML POUR BTL (IV SOLUTION) ×2 IMPLANT
PACK TOTAL JOINT (CUSTOM PROCEDURE TRAY) ×2 IMPLANT
PAD ARMBOARD 7.5X6 YLW CONV (MISCELLANEOUS) ×2 IMPLANT
PAD DANNIFLEX CPM (ORTHOPEDIC SUPPLIES) ×2 IMPLANT
PIN TROCAR 3 INCH (PIN) ×2 IMPLANT
SET BASIN LINEN APH (SET/KITS/TRAYS/PACK) ×2 IMPLANT
SET HNDPC FAN SPRY TIP SCT (DISPOSABLE) ×1 IMPLANT
SPONGE GAUZE 4X4 12PLY (GAUZE/BANDAGES/DRESSINGS) IMPLANT
STAPLER VISISTAT 35W (STAPLE) ×2 IMPLANT
SUT BRALON NAB BRD #1 30IN (SUTURE) ×2 IMPLANT
SUT MON AB 0 CT1 (SUTURE) ×2 IMPLANT
SUT MON AB 2-0 CT1 36 (SUTURE) ×2 IMPLANT
SYR 30ML LL (SYRINGE) ×2 IMPLANT
SYR BULB IRRIGATION 50ML (SYRINGE) ×2 IMPLANT
TOWEL OR 17X26 4PK STRL BLUE (TOWEL DISPOSABLE) ×2 IMPLANT
TOWER CARTRIDGE SMART MIX (DISPOSABLE) ×2 IMPLANT
TRAY FOLEY CATH 14FR (SET/KITS/TRAYS/PACK) ×2 IMPLANT
WATER STERILE IRR 1000ML POUR (IV SOLUTION) ×4 IMPLANT
YANKAUER SUCT 12FT TUBE ARGYLE (SUCTIONS) ×2 IMPLANT

## 2012-07-01 NOTE — Progress Notes (Signed)
From OR. Hemovac drain to rt knee disconnected. Small amt red bloody drainage at top of drsg. D Dallas at bedside to check drain. New hemovac applied and activated. Clean 6" ace wrap applied. hemovac draining bloody drainage. Knee immobilizer applied to rt leg. Ice pack to rt knee. Moaning/groaning. Med as noted for pain.

## 2012-07-01 NOTE — Progress Notes (Signed)
Resting quietly. O2  Changed to nasal cannula. Sleeping at intervals.

## 2012-07-01 NOTE — Transfer of Care (Signed)
  Anesthesia Post-op Note  Patient: Diane [name]  Procedure(s) Performed: Procedure(s) (LRB) with comments: TOTAL KNEE ARTHROPLASTY (Right)  Patient Location: PACU  Anesthesia Type: General  Level of Consciousness: awake, alert , oriented and patient cooperative  Airway and Oxygen Therapy: Patient Spontanous Breathing and Patient connected to face mask oxygen  Post-op Pain: mild  Post-op Assessment: Post-op Vital signs reviewed, Patient's Cardiovascular Status Stable, Respiratory Function Stable, Patent Airway and No signs of Nausea or vomiting  Post-op Vital Signs: Reviewed and stable  Complications: No apparent anesthesia complications  

## 2012-07-01 NOTE — Progress Notes (Signed)
All po medications held this afternoon/evening due to pt being sedated/drowsy.  Pt does respond to name and O2 sats are maintaining on 3lpm via [place] >95%.  Bp is remaining stable as well.  Will continue to monitor pt closely.  Family member at bedside.

## 2012-07-01 NOTE — Interval H&P Note (Signed)
History and Physical Interval Note:  07/01/2012 7:19 AM  Diane [name]  has presented today for surgery, with the diagnosis of right knee osteoarthritis  The various methods of treatment have been discussed with the patient and family. After consideration of risks, benefits and other options for treatment, the patient has consented to  Procedure(s) (LRB) with comments: TOTAL KNEE ARTHROPLASTY (Right) as a surgical intervention .  The patient's history has been reviewed, patient examined, no change in status, stable for surgery.  I have reviewed the patient's chart and labs.  Questions were answered to the patient's satisfaction.    RIGHT TKA  Fuller [place]

## 2012-07-01 NOTE — Progress Notes (Signed)
Dr Gonzalez at bedside to check pt. Orders given. 

## 2012-07-01 NOTE — Anesthesia Preprocedure Evaluation (Signed)
Anesthesia Evaluation  Patient identified by MRN, date of birth, ID band Patient awake    Reviewed: Allergy & Precautions, H&P , NPO status , Patient's Chart, lab work & pertinent test results  Airway Mallampati: II TM Distance: >3 FB Neck ROM: Full    Dental  (+) Edentulous Upper   Pulmonary sleep apnea ,    Pulmonary exam normal       Cardiovascular hypertension, Pt. on medications Rhythm:Regular Rate:Normal     Neuro/Psych  Headaches, Seizures -, Well Controlled,  PSYCHIATRIC DISORDERS Anxiety Depression    GI/Hepatic GERD-  Medicated,  Endo/Other  Hypothyroidism   Renal/GU      Musculoskeletal  (+) Arthritis -,   Abdominal (+) + obese,  Abdomen: soft.    Peds  Hematology negative hematology ROS (+)   Anesthesia Other Findings   Reproductive/Obstetrics negative OB ROS                           Anesthesia Physical Anesthesia Plan  ASA: III  Anesthesia Plan: General   Post-op Pain Management:    Induction: Intravenous, Rapid sequence and Cricoid pressure planned  Airway Management Planned: Oral ETT  Additional Equipment:   Intra-op Plan:   Post-operative Plan: Extubation in OR  Informed Consent: I have reviewed the patients History and Physical, chart, labs and discussed the procedure including the risks, benefits and alternatives for the proposed anesthesia with the patient or authorized representative who has indicated his/her understanding and acceptance.     Plan Discussed with:   Anesthesia Plan Comments:         Anesthesia Quick Evaluation  

## 2012-07-01 NOTE — Progress Notes (Signed)
Dr Jayme Cloud at bedside to check pt. Orders given. Cleared for D/C to room.

## 2012-07-01 NOTE — Progress Notes (Signed)
Awakens easily to name. Opens eyes to name. Sip of sprite given. Tolerated well. resp adequate/nonlabored. O2 changed to nasal cannula at 3l/m.

## 2012-07-01 NOTE — Progress Notes (Signed)
BP 106/81. Dr Jayme Cloud at bedside to check pt. Instructed to give 500 ml bolus of LR. Bolus started.

## 2012-07-01 NOTE — Progress Notes (Signed)
Sleeping. No response to verbal or touch stimuli. Resp adequate/nonlabored. Resp rate 7-9. Encouraged to deep breath. O2 continued.

## 2012-07-01 NOTE — Progress Notes (Signed)
Awake. Continues moaning/groaning. C/O postop rt knee pain. Rates pain 10. Med as noted.

## 2012-07-01 NOTE — Progress Notes (Signed)
Opens eyes to name. No verbal communications. o2 continued. resp adequate/nonlabored. resp rate 9. BP 121/63. Returns to sleep easily.

## 2012-07-01 NOTE — Op Note (Signed)
Preop diagnosis osteoarthritis right knee Postop diagnosis same Procedure right  total knee arthroplasty Surgeon Romeo Apple Assisted by Valetta Close and betty ashley  Anesthesia spinal Findings oa ptf and medial  Tourniquet time 89 minutes, pressure 300 mm of mercury  Indications for procedure disabling knee pain, failure to control pain with nonoperative measures  Details of procedure:  In the preop area the patient's right  knee was marked and countersigned by the surgeon, the chart was updated, consent was signed  The patient was taken to the operating room for general anesthetic followed by administering 1.5 g of vanco based on weight of >100 kg  A Foley catheter was inserted sterilely, then the operative extremity right was prepped and draped sterilely  The timeout was completed  The limb was then exsanguinated with a six-inch Esmarch with the knee in flexion and the tourniquet was elevated to 300 mm of mercury. A midline incision was made, the subcutaneous tissues were divided down to the extensor mechanism. A medial arthrotomy was performed, the patella was everted,  the fat pad was resected. The medial and  lateral menisci were resected. The medial soft tissue sleeve was elevated to the mid coronal plane. The anterior cruciate ligament and PCL were resected. Osteophytes were removed. The distal femur anterior surface was skeletonized with sharp dissection.  A three-eighths inch drill bit was used to enter the femoral canal which was suctioned and irrigated until the fluid was clear;  an intramedullary rod was placed in the femur with a 5 11 right  setting, the block was pinned in place; then an 11mm + 2 mm distal femoral resection was performed. The cut was checked for flatness.  The sizing guide was then placed on the femur, the femur measured a size 4; the block was pinned in external rotation 3 using the epicondyles as reference; a  4-in-1 cutting block was placed and the 4 cuts  were made with retractors protecting the collateral ligaments. The posterior osteophytes were removed with a curved osteotome. Residual PCL tissue was resected; residual meniscal tissue was resected.  The external tibial alignment instrument was set for tibial resection. The guide was   placed referencing the medial side which was the worn side and the stylus was set at 4 mm resection. Anterior slope was built in to match the patients anatomy; a neutral varus valgus cut was set using the medial third of the tibial tubercle as reference along with the medial portion of the lateral tibial spine. The guide was stabilized with pins.  A saw was used to resect the anterior tibia. The tibia was sized to a size 3 base plate.  We then placed spacer blocks using a  10 spacer block;  the knee was balanced  in extension and was balanced in flexion with the 10 spacer block   The box cut was then done using the box cutting guide. We then turned our attention to the patella.  The patella measured 23 mm we set the guide to leave 14 mm of patella.  After resection of the patella,  It was remeasured, and measured 12 mm. The size was 32. We then drilled the 3 peg holes.  We then did a trial reduction. The trial reduction was excellent with full extension, balanced in extension, balance in flexion. Passive flexion equalled 110, patella normal tracking.   We then punched the tibia per technique.   The bone was then irrigated and dried while cement was mixed on the back table.  The implants were checked for accuracy and then cemented in place; excess cement was removed; the cement was allowed to cure. The wound was then irrigated with copious amounts of saline, the posterior capsule was injected with 30 cc of Marcaine with epinephrine followed by 30 cc in the joint. The 10 insert was placed. Range of motion matched trial reduction  The capsule was closed with #1 Bralon in interrupted and running fashion and then the joint was  injected with additional 30 cc of Marcaine with epinephrine.  The drained was checked and was clear   The subcutaneous tissues were closed with  0 Monocryl  Staples were used to reapproximate the skin edges.  Sterile dressings were applied. A radiograph was obtained.   The patient was then taken to the recovery room in stable condition.  Routine postop plan for knee replacement.

## 2012-07-01 NOTE — Anesthesia Procedure Notes (Signed)
Procedure Name: Intubation Date/Time: 07/01/2012 7:41 AM Performed by: Carolyne Littles, Shirlie Enck L Pre-anesthesia Checklist: Patient identified, Patient being monitored, Timeout performed, Emergency Drugs available and Suction available Patient Re-evaluated:Patient Re-evaluated prior to inductionOxygen Delivery Method: Circle System Utilized Preoxygenation: Pre-oxygenation with 100% oxygen Intubation Type: IV induction, Rapid sequence and Cricoid Pressure applied Laryngoscope Size: 3 and Miller Grade View: Grade I Tube type: Oral Tube size: 7.0 mm Number of attempts: 1 Airway Equipment and Method: stylet Placement Confirmation: ETT inserted through vocal cords under direct vision,  positive ETCO2 and breath sounds checked- equal and bilateral Secured at: 21 cm Tube secured with: Tape Dental Injury: Teeth and Oropharynx as per pre-operative assessment

## 2012-07-01 NOTE — Anesthesia Postprocedure Evaluation (Signed)
  Anesthesia Post-op Note  Patient: Diane [name]  Procedure(s) Performed: Procedure(s) (LRB) with comments: TOTAL KNEE ARTHROPLASTY (Right)  Patient Location: PACU  Anesthesia Type: General  Level of Consciousness: awake, alert , oriented and patient cooperative  Airway and Oxygen Therapy: Patient Spontanous Breathing and Patient connected to face mask oxygen  Post-op Pain: mild  Post-op Assessment: Post-op Vital signs reviewed, Patient's Cardiovascular Status Stable, Respiratory Function Stable, Patent Airway and No signs of Nausea or vomiting  Post-op Vital Signs: Reviewed and stable  Complications: No apparent anesthesia complications

## 2012-07-01 NOTE — Progress Notes (Signed)
Awake. Moaning/groaning. C/O postop rt knee pain. Rates pain 10. Med as noted.

## 2012-07-01 NOTE — Progress Notes (Signed)
Reported off to [employer]

## 2012-07-01 NOTE — Brief Op Note (Signed)
07/01/2012  9:52 AM  PATIENT:  Diane [name]  61 y.o. female  PRE-OPERATIVE DIAGNOSIS:  right knee osteoarthritis  POST-OPERATIVE DIAGNOSIS:  right knee osteoarthritis  PROCEDURE:  Procedure(s) (LRB) with comments: TOTAL KNEE ARTHROPLASTY (Right)  SURGEON:  Surgeon(s) and Role:    * Vickki Hearing, MD - Primary  PHYSICIAN ASSISTANT:   ASSISTANTS: betty ashley; debbie dallas   ANESTHESIA:   general  EBL:  Total I/O In: 1500 [I.V.:1500] Out: 175 [Urine:150; Blood:25]  BLOOD ADMINISTERED:none  DRAINS: hemovac   LOCAL MEDICATIONS USED:  MARCAINE   , Amount: 90 ml and OTHER epi  SPECIMEN:  No Specimen  DISPOSITION OF SPECIMEN:  N/A  COUNTS:  YES  TOURNIQUET:   Total Tourniquet Time Documented: Thigh (Right) - 91 minutes  DICTATION: .Reubin Milan Dictation  PLAN OF CARE: Admit to inpatient   PATIENT DISPOSITION:  PACU - hemodynamically stable.   Delay start of Pharmacological VTE agent (>24hrs) due to surgical blood loss or risk of bleeding: no

## 2012-07-01 NOTE — Preoperative (Signed)
Beta Blockers   Reason not to administer Beta Blockers:Not Applicable 

## 2012-07-02 LAB — BASIC METABOLIC PANEL
BUN: 8 mg/dL (ref 6–23)
CO2: 31 mEq/L (ref 19–32)
Calcium: 7.6 mg/dL — ABNORMAL LOW (ref 8.4–10.5)
Chloride: 106 mEq/L (ref 96–112)
Creatinine, Ser: 0.75 mg/dL (ref 0.50–1.10)
GFR calc Af Amer: >90 mL/min (ref >90)
GFR calc non Af Amer: 89 mL/min — ABNORMAL LOW (ref >90)
Glucose, Bld: 103 mg/dL — ABNORMAL HIGH (ref 70–99)
Potassium: 4.4 mEq/L (ref 3.5–5.1)
Sodium: 141 mEq/L (ref 135–145)

## 2012-07-02 LAB — CBC
HCT: 32.3 % — ABNORMAL LOW (ref 36.0–46.0)
Hemoglobin: 10.1 g/dL — ABNORMAL LOW (ref 12.0–15.0)
MCH: 28.5 pg (ref 26.0–34.0)
MCHC: 31.3 g/dL (ref 30.0–36.0)
MCV: 91 fL (ref 78.0–100.0)
Platelets: 139 10*3/uL — ABNORMAL LOW (ref 150–400)
RBC: 3.55 MIL/uL — ABNORMAL LOW (ref 3.87–5.11)
RDW: 14 % (ref 11.5–15.5)
WBC: 6.1 10*3/uL (ref 4.0–10.5)

## 2012-07-02 MED ORDER — NALOXONE HCL 0.4 MG/ML IJ SOLN
INTRAMUSCULAR | Status: AC
  Filled 2012-07-02: qty 1

## 2012-07-02 MED ORDER — NALOXONE HCL 0.4 MG/ML IJ SOLN
0.4000 mg | INTRAMUSCULAR | Status: DC | PRN
  Administered 2012-07-02: 0.4 mg via INTRAVENOUS

## 2012-07-02 MED ORDER — OXYCODONE-ACETAMINOPHEN 5-325 MG PO TABS
1.0000 | ORAL_TABLET | ORAL | Status: DC
  Administered 2012-07-02 – 2012-07-03 (×9): 1 via ORAL
  Filled 2012-07-02 (×9): qty 1

## 2012-07-02 NOTE — Progress Notes (Signed)
Physical Therapy Treatment Patient Details Name: Diane [name] MRN: [id number] DOB: [birth year] Today's Date: 07/02/2012 Time: 1400-1416 PT Time Calculation (min): 16 min  PT Assessment / Plan / Recommendation Comments on Treatment Session  Pt was just medicated for pain and is now found to be so lethargic that she is unable to stay awake.  RN was alerted.  We were unable to do any ther ex because of lethargy, so pt's RLE was placed in CPM set at 0-60 deg.    Follow Up Recommendations  Home health PT     Does the patient have the potential to tolerate intense rehabilitation     Barriers to Discharge None      Equipment Recommendations  None recommended by PT    Recommendations for Other Services    Frequency 7X/week   Plan Discharge plan remains appropriate;Frequency remains appropriate    Precautions / Restrictions Precautions Precautions: Knee Required Braces or Orthoses: Knee Immobilizer - Right Knee Immobilizer - Right: On except when in CPM Restrictions Weight Bearing Restrictions: No Other Position/Activity Restrictions: WBAT R   Pertinent Vitals/Pain     Mobility  Bed Mobility Bed Mobility: Supine to Sit;Sit to Supine Supine to Sit: Not tested (comment) Sit to Supine: Not Tested (comment) Details for Bed Mobility Assistance: needs assist to move RLE in bed Transfers Transfers: Sit to Stand;Stand to Sit Sit to Stand: 3: Mod assist;With upper extremity assist;From bed Stand to Sit: 3: Mod assist;With upper extremity assist;To chair/3-in-1 Details for Transfer Assistance: pt is obese and generally deconditioned...has difficulty managing her body weight Ambulation/Gait Ambulation/Gait Assistance: 3: Mod assist Ambulation Distance (Feet): 2 Feet Assistive device: Rolling walker Gait Pattern: Step-to pattern;Decreased stance time - left;Decreased step length - right;Antalgic Gait velocity: very slow and labored General Gait Details: needs instruction for each  phase of gait sequence...knee immobilizer is on  Stairs: No Wheelchair Mobility Wheelchair Mobility: No    Exercises Total Joint Exercises Ankle Circles/Pumps: AROM;Both;10 reps;Supine Quad Sets: AROM;Both;10 reps;Supine Short Arc Quad: AAROM;Right;10 reps;Supine Heel Slides: Right;10 reps;Supine Goniometric ROM: 5-42 deg at R knee   PT Diagnosis: Difficulty walking;Generalized weakness;Acute pain  PT Problem List: Decreased strength;Decreased range of motion;Decreased activity tolerance;Decreased mobility;Obesity;Pain PT Treatment Interventions: DME instruction;Gait training;Functional mobility training;Therapeutic activities;Therapeutic exercise;Patient/family education   PT Goals Acute Rehab PT Goals PT Goal Formulation: With patient Time For Goal Achievement: 07/09/12 Potential to Achieve Goals: Good Pt will go Supine/Side to Sit: with supervision;with HOB 0 degrees PT Goal: Supine/Side to Sit - Progress: Goal set today Pt will go Sit to Supine/Side: with supervision;with HOB 0 degrees PT Goal: Sit to Supine/Side - Progress: Goal set today Pt will go Sit to Stand: with supervision;with upper extremity assist PT Goal: Sit to Stand - Progress: Goal set today Pt will go Stand to Sit: with supervision;with upper extremity assist PT Goal: Stand to Sit - Progress: Goal set today Pt will Ambulate: 16 - 50 feet;with supervision;with rolling walker PT Goal: Ambulate - Progress: Goal set today  Visit Information  Last PT Received On: 07/02/12    Subjective Data  Subjective: no c/o Patient Stated Goal: return home   Cognition  Overall Cognitive Status: Appears within functional limits for tasks assessed/performed Arousal/Alertness: Awake/alert Orientation Level: Appears intact for tasks assessed Behavior During Session: [facility] for tasks performed    Balance  Balance Balance Assessed: No  End of Session PT - End of Session Equipment Utilized During Treatment: Gait belt;Right  knee immobilizer Activity Tolerance: Patient limited by fatigue Patient  left: in CPM Nurse Communication: Mobility status CPM Right Knee CPM Right Knee: On Right Knee Flexion (Degrees): 60  Right Knee Extension (Degrees): 0    GP     Myrlene Broker L 07/02/2012, 3:04 PM

## 2012-07-02 NOTE — Anesthesia Postprocedure Evaluation (Signed)
  Anesthesia Post-op Note  Patient: Diane [name]  Procedure(s) Performed: Procedure(s) (LRB) with comments: TOTAL KNEE ARTHROPLASTY (Right)  Patient Location: ICU 8  Anesthesia Type: General  Level of Consciousness: awake, alert , oriented and patient cooperative  Airway and Oxygen Therapy: Patient Spontanous Breathing and Patient connected to nasal cannula  Post-op Pain: mild  Post-op Assessment: Post-op Vital signs reviewed, Patient's Cardiovascular Status Stable, Respiratory Function Stable, Patent Airway and No signs of Nausea or vomiting  Post-op Vital Signs: Reviewed and stable  Complications: No apparent anesthesia complications

## 2012-07-02 NOTE — Progress Notes (Signed)
Subjective: 1 Day Post-Op Procedure(s) (LRB): TOTAL KNEE ARTHROPLASTY (Right) Patient reports pain as pain med caused somnolence, and then pain levels 8-10 but awake and comfortable today .    Objective: Vital signs in last 24 hours: Temp:  [97.2 F (36.2 C)-97.8 F (36.6 C)] 97.6 F (36.4 C) (10/22 0547) Pulse Rate:  [56-80] 56  (10/22 0547) Resp:  [6-16] 9  (10/22 0400) BP: (83-174)/(53-93) 138/73 mmHg (10/22 0547) SpO2:  [90 %-100 %] 98 % (10/22 0547) Weight:  [231 lb 7.7 oz (105 kg)] 231 lb 7.7 oz (105 kg) (10/21 1416)  Intake/Output from previous day: 10/21 0701 - 10/22 0700 In: 2191.7 [P.O.:240; I.V.:1951.7] Out: 325 [Urine:300; Blood:25] Intake/Output this shift:     Basename 07/02/12 0531  HGB 10.1*    Basename 07/02/12 0531  WBC 6.1  RBC 3.55*  HCT 32.3*  PLT 139*    Basename 07/02/12 0531  NA 141  K 4.4  CL 106  CO2 31  BUN 8  CREATININE 0.75  GLUCOSE 103*  CALCIUM 7.6*   No results found for this basename: LABPT:2,INR:2 in the last 72 hours  Neurologically intact Neurovascular intact Sensation intact distally Intact pulses distally Dorsiflexion/Plantar flexion intact  Assessment/Plan: 1 Day Post-Op Procedure(s) (LRB): TOTAL KNEE ARTHROPLASTY (Right) Advance diet Up with therapy D/C IV fluids  Fuller Canada 07/02/2012, 7:18 AM

## 2012-07-02 NOTE — Progress Notes (Signed)
UR chart review completed.  

## 2012-07-02 NOTE — Addendum Note (Signed)
Addendum  created 07/02/12 0755 by Marolyn Hammock, CRNA   Modules edited:Notes Section

## 2012-07-02 NOTE — Progress Notes (Signed)
Patient took off O2 to eat breakfast and did not put it back on, stats found to be 83%.  Placed back on 2L O2 - stats 96%.

## 2012-07-02 NOTE — Evaluation (Signed)
Physical Therapy Evaluation Patient Details Name: Diane [name] MRN: [id number] DOB: 11-16-1950 Today's Date: 07/02/2012 Time: 4098-1191 PT Time Calculation (min): 57 min  PT Assessment / Plan / Recommendation Clinical Impression  Pt is seen to begin TKR protocol.  She was recently medicated for pain, yet still c/o 7/10 pain in R knee.  She lives alone, but can have full time assist at home.  We initiated ther ex for R knee with ROM =5-42 deg, AA.  She does have an immobilizer on which will be used except when doing ex or in CPM.  All mobility is very labored as pt is obese and she has difficulty moving her body weight.   Mod assist needed to transfer bed to chair.  I am hoping that her mobility will quickly improve so that she will be able to transfer to home.           PT Assessment  Patient needs continued PT services    Follow Up Recommendations  Home health PT    Does the patient have the potential to tolerate intense rehabilitation      Barriers to Discharge None      Equipment Recommendations  None recommended by PT    Recommendations for Other Services     Frequency 7X/week    Precautions / Restrictions Precautions Precautions: Knee Required Braces or Orthoses: Knee Immobilizer - Right Knee Immobilizer - Right: On except when in CPM Restrictions Weight Bearing Restrictions: No Other Position/Activity Restrictions: WBAT R   Pertinent Vitals/Pain       Mobility  Bed Mobility Bed Mobility: Supine to Sit;Sit to Supine Supine to Sit: 4: Min assist;HOB elevated Sit to Supine: Not Tested (comment) Details for Bed Mobility Assistance: needs assist to move RLE in bed Transfers Transfers: Sit to Stand;Stand to Sit Sit to Stand: 3: Mod assist;With upper extremity assist;From bed Stand to Sit: 3: Mod assist;With upper extremity assist;To chair/3-in-1 Details for Transfer Assistance: pt is obese and generally deconditioned...has difficulty managing her body  weight Ambulation/Gait Ambulation/Gait Assistance: 3: Mod assist Ambulation Distance (Feet): 2 Feet Assistive device: Rolling walker Gait Pattern: Step-to pattern;Decreased stance time - left;Decreased step length - right;Antalgic Gait velocity: very slow and labored General Gait Details: needs instruction for each phase of gait sequence...knee immobilizer is on  Stairs: No Wheelchair Mobility Wheelchair Mobility: No    Shoulder Instructions     Exercises Total Joint Exercises Ankle Circles/Pumps: AROM;Both;10 reps;Supine Quad Sets: AROM;Both;10 reps;Supine Short Arc Quad: AAROM;Right;10 reps;Supine Heel Slides: Right;10 reps;Supine Goniometric ROM: 5-42 deg at R knee   PT Diagnosis: Difficulty walking;Generalized weakness;Acute pain  PT Problem List: Decreased strength;Decreased range of motion;Decreased activity tolerance;Decreased mobility;Obesity;Pain PT Treatment Interventions: DME instruction;Gait training;Functional mobility training;Therapeutic activities;Therapeutic exercise;Patient/family education   PT Goals Acute Rehab PT Goals PT Goal Formulation: With patient Time For Goal Achievement: 07/09/12 Potential to Achieve Goals: Good Pt will go Supine/Side to Sit: with supervision;with HOB 0 degrees PT Goal: Supine/Side to Sit - Progress: Goal set today Pt will go Sit to Supine/Side: with supervision;with HOB 0 degrees PT Goal: Sit to Supine/Side - Progress: Goal set today Pt will go Sit to Stand: with supervision;with upper extremity assist PT Goal: Sit to Stand - Progress: Goal set today Pt will go Stand to Sit: with supervision;with upper extremity assist PT Goal: Stand to Sit - Progress: Goal set today Pt will Ambulate: 16 - 50 feet;with supervision;with rolling walker PT Goal: Ambulate - Progress: Goal set today  Visit Information  Last  PT Received On: 07/02/12    Subjective Data  Subjective: no c/o Patient Stated Goal: return home   Prior Functioning   Home Living Lives With: Alone Available Help at Discharge: Family;Available 24 hours/day Type of Home: House Home Access: Level entry Home Layout: One level Bathroom Toilet: Standard Home Adaptive Equipment: Walker - rolling;Bedside commode/3-in-1 Prior Function Level of Independence: Independent Able to Take Stairs?: Yes Driving: Yes Vocation: On disability Communication Communication: No difficulties    Cognition  Overall Cognitive Status: Appears within functional limits for tasks assessed/performed Arousal/Alertness: Awake/alert Orientation Level: Appears intact for tasks assessed Behavior During Session: [facility] for tasks performed    Extremity/Trunk Assessment Right Lower Extremity Assessment RLE ROM/Strength/Tone: Deficits RLE ROM/Strength/Tone Deficits: ROM of knee=5-42 deg, AA...limited strength at quad RLE Sensation: WFL - Light Touch Left Lower Extremity Assessment LLE ROM/Strength/Tone: Within functional levels   Balance Balance Balance Assessed: No  End of Session PT - End of Session Equipment Utilized During Treatment: Gait belt;Right knee immobilizer Activity Tolerance: Patient tolerated treatment well Patient left: in chair;with call bell/phone within reach Nurse Communication: Mobility status  GP     Konrad Penta 07/02/2012, 12:09 PM

## 2012-07-02 NOTE — Evaluation (Signed)
Occupational Therapy Evaluation Patient Details Name: LEWANNA PETRAK MRN: [id number] DOB: 1950-10-11 Today's Date: 07/02/2012 Time: 1345-1415 OT Time Calculation (min): 30 min  OT Assessment / Plan / Recommendation Clinical Impression  Patient is a 61 y/o female s/p R TKA presenting to acute OT with deficits below. Patient will benefit from OT services to increase ADL performance, functional transfers and Bil UE strength and endurance.    OT Assessment  Patient needs continued OT Services    Follow Up Recommendations  Home health OT    Barriers to Discharge None    Equipment Recommendations  Tub/shower bench       Frequency  Min 2X/week    Precautions / Restrictions Precautions Precautions: Knee Required Braces or Orthoses: Knee Immobilizer - Right Knee Immobilizer - Right: On except when in CPM Restrictions Weight Bearing Restrictions: No Other Position/Activity Restrictions: WBAT R   Pertinent Vitals/Pain 9/10 pain level in right knee. Nursing aware and patient received IV meds.    ADL  Lower Body Dressing: Maximal assistance;Performed Where Assessed - Lower Body Dressing: Supported sit to stand Toilet Transfer: Performed;Min guard Statistician Method: Surveyor, minerals: Materials engineer and Hygiene: Performed;Minimal assistance Where Assessed - Engineer, mining and Hygiene: Standing Transfers/Ambulation Related to ADLs: patient transfers at a [employer] level with walker.    OT Diagnosis: Generalized weakness  OT Problem List: Decreased strength;Impaired balance (sitting and/or standing);Decreased activity tolerance;Decreased knowledge of use of DME or AE;Pain;Obesity OT Treatment Interventions: Self-care/ADL training;Therapeutic exercise;Energy conservation;Therapeutic activities;DME and/or AE instruction;Balance training;Patient/family education   OT Goals Acute Rehab OT Goals OT Goal  Formulation: With patient Time For Goal Achievement: 07/16/12 Potential to Achieve Goals: Good ADL Goals Pt Will Perform Grooming: with modified independence;Sitting, edge of bed ADL Goal: Grooming - Progress: Goal set today Pt Will Perform Upper Body Bathing: with modified independence;Sitting, edge of bed ADL Goal: Upper Body Bathing - Progress: Goal set today Pt Will Perform Lower Body Bathing: with adaptive equipment;Sit to stand from bed;with min assist ADL Goal: Lower Body Bathing - Progress: Goal set today Pt Will Perform Upper Body Dressing: with modified independence;Sitting, bed ADL Goal: Upper Body Dressing - Progress: Goal set today Pt Will Perform Lower Body Dressing: with min assist;with adaptive equipment;Sit to stand from bed ADL Goal: Lower Body Dressing - Progress: Goal set today Pt Will Transfer to Toilet: with supervision;3-in-1;Stand pivot transfer;with DME ADL Goal: Toilet Transfer - Progress: Goal set today Pt Will Perform Toileting - Clothing Manipulation: with supervision;Standing ADL Goal: Toileting - Clothing Manipulation - Progress: Goal set today Pt Will Perform Toileting - Hygiene: with supervision;Sit to stand from 3-in-1/toilet ADL Goal: Toileting - Hygiene - Progress: Goal set today Arm Goals Pt Will Complete Theraband Exer: with supervision, verbal cues required/provided;to maintain strength;Bilateral upper extremities;2 sets;10 reps;Level 1 Theraband Arm Goal: Theraband Exercises - Progress: Goal set today  Visit Information  Last OT Received On: 07/02/12 Assistance Needed: +1    Subjective Data  Subjective: " I still have a lot of pain." Patient Stated Goal: To go home.   Prior Functioning     Home Living Lives With: Alone Available Help at Discharge: Family;Available 24 hours/day Type of Home: House Home Access: Level entry Home Layout: One level Bathroom Shower/Tub: Engineer, manufacturing systems: Standard Home Adaptive Equipment:  Walker - rolling;Bedside commode/3-in-1 Prior Function Level of Independence: Independent Able to Take Stairs?: Yes Driving: Yes Vocation: On disability Communication Communication: No difficulties Dominant Hand: Right  Cognition  Overall Cognitive Status: Appears within functional limits for tasks assessed/performed Arousal/Alertness: Awake/alert Orientation Level: Appears intact for tasks assessed Behavior During Session: [facility] for tasks performed    Extremity/Trunk Assessment Right Upper Extremity Assessment RUE ROM/Strength/Tone: Within functional levels (MMT: 4-/5) Left Upper Extremity Assessment LUE ROM/Strength/Tone: Within functional levels (MMT: 4-/5)     Mobility Bed Mobility Bed Mobility: Sit to Supine Supine to Sit: Not tested (comment) Sit to Supine: 4: Min assist Details for Bed Mobility Assistance: needs assist to move RLE in bed Transfers Transfers: Sit to Stand;Stand to Sit Sit to Stand: 4: Min guard;With upper extremity assist;From chair/3-in-1 Stand to Sit: 4: Min guard;To chair/3-in-1 Details for Transfer Assistance: Very slow moving.           Balance Balance Balance Assessed: No   End of Session OT - End of Session Equipment Utilized During Treatment: Right knee immobilizer;Other (comment) (rolling walker) Activity Tolerance: Patient limited by fatigue;Patient tolerated treatment well;Patient limited by pain Patient left: in bed;with call bell/phone within reach;Other (comment) (with PT) Nurse Communication: Patient requests pain meds      Limmie Patricia, OTR/L 07/02/2012, 3:18 PM

## 2012-07-02 NOTE — Progress Notes (Signed)
Patient O2 stats reading 70% on 4L O2.  Patient was just given 0.5 mg Dilaudid to help with pain when working with PT.  Patient very drowsy but responsive.  Paged MD for narcan order - 4mg  given.   O2 stats are now 100% on 2L.

## 2012-07-03 LAB — CBC
HCT: 33.1 % — ABNORMAL LOW (ref 36.0–46.0)
Hemoglobin: 10.4 g/dL — ABNORMAL LOW (ref 12.0–15.0)
MCH: 28 pg (ref 26.0–34.0)
MCHC: 31.4 g/dL (ref 30.0–36.0)
MCV: 89.2 fL (ref 78.0–100.0)
Platelets: 135 10*3/uL — ABNORMAL LOW (ref 150–400)
RBC: 3.71 MIL/uL — ABNORMAL LOW (ref 3.87–5.11)
RDW: 13.6 % (ref 11.5–15.5)
WBC: 6 10*3/uL (ref 4.0–10.5)

## 2012-07-03 MED ORDER — ALBUTEROL SULFATE (5 MG/ML) 0.5% IN NEBU
2.5000 mg | INHALATION_SOLUTION | Freq: Four times a day (QID) | RESPIRATORY_TRACT | Status: DC
  Administered 2012-07-03 – 2012-07-04 (×5): 2.5 mg via RESPIRATORY_TRACT
  Filled 2012-07-03 (×5): qty 0.5

## 2012-07-03 NOTE — Care Management Note (Signed)
    Page 1 of 1   07/04/2012     3:43:02 PM   CARE MANAGEMENT NOTE 07/04/2012  Patient:  Diane [name], Diane [name]   Account Number:  [id number]  Date Initiated:  07/03/2012  Documentation initiated by:  Sharrie Rothman  Subjective/Objective Assessment:   Pt admitted from home s/p right knee surgery. Pt lives alone but has a grandson and granddaughter that she said could help her at times. PT is recommending SNF and pt is agreeable.     Action/Plan:   CSW will arrange discharge to facility when medically stable.   Anticipated DC Date:  07/04/2012   Anticipated DC Plan:  SKILLED NURSING FACILITY  In-house referral  Clinical Social Worker      DC Planning Services  CM consult      Choice offered to / List presented to:             Status of service:  Completed, signed off Medicare Important Message given?   (If response is "NO", the following Medicare IM given date fields will be blank) Date Medicare IM given:   Date Additional Medicare IM given:    Discharge Disposition:  SKILLED NURSING FACILITY  Per UR Regulation:    If discussed at Long Length of Stay Meetings, dates discussed:    Comments:  07/04/12 1540 Arlyss Queen, RN BSN CM Pt discharged to [facility] today. CSW arranged discharge to facility.  07/03/12 1551 Arlyss Queen, RN BSN CM

## 2012-07-03 NOTE — Progress Notes (Signed)
O2 Sats mid 80's on 2L/[place]. Encouraged pt to take good deep breaths. sats came up to 87%. Venti-Mask applied sats 96%. Will cont to monitor.

## 2012-07-03 NOTE — Clinical Social Work Psychosocial (Signed)
Clinical Social Work Department BRIEF PSYCHOSOCIAL ASSESSMENT 07/03/2012  Patient:  Diane [name], Diane [name]     Account Number:  [id number]     Admit date:  07/01/2012  Clinical Social Worker:  Nancie Neas  Date/Time:  07/03/2012 11:25 AM  Referred by:  Physician  Date Referred:  07/03/2012 Referred for  SNF Placement   Other Referral:   Interview type:  Patient Other interview type:    PSYCHOSOCIAL DATA Living Status:  ALONE Admitted from facility:   Level of care:   Primary support name:  Brandi Primary support relationship to patient:  FAMILY Degree of support available:   niece- adequate    CURRENT CONCERNS Current Concerns  Post-Acute Placement   Other Concerns:    SOCIAL WORK ASSESSMENT / PLAN CSW met with pt at bedside. Pt was evaluated by PT yesterday and recommended home health. Today, pt has made extremely slow progress and PT does not feel pt can return home at d/c. Pt alert and oriented but very lethargic. She states she lives alone and her best support person is her niece, Merry Proud. Pt said she has been on disability since 1991. Pt had been experiencing difficulty with her knee for years and decided it was time for surgery. CSW discussed placement process and [employer] authorization. She is aware of copays. Pt lives in [place] and requests placement in [place] if possible.   Assessment/plan status:  Psychosocial Support/Ongoing Assessment of Needs Other assessment/ plan:   Information/referral to community resources:   SNF list    PATIENT'S/FAMILY'S RESPONSE TO PLAN OF CARE: Pt is reluctantly agreeable to SNF as she was originally hopeful to return home. CSW will fax out FL2 and initiate [employer] authorization and return with bed offers when available.        Derenda Fennel, [place] [phone]

## 2012-07-03 NOTE — Progress Notes (Signed)
Physical Therapy Treatment Patient Details Name: Diane [name] MRN: [id number] DOB: Oct 17, 1950 Today's Date: 07/03/2012 Time: 1221-1255 PT Time Calculation (min): 34 min  PT Assessment / Plan / Recommendation Comments on Treatment Session  Pt continues to be abnormally drowsy.  She was able to stay awake for PT, but had to be awakened frequently.  Knee ROM is 5-70 deg with stretching.  Transfers continue to be very slow and labored.  Gait is not yet functional.  She will definately need SNF at d/c.    Follow Up Recommendations  Post acute inpatient     Does the patient have the potential to tolerate intense rehabilitation  No, Recommend SNF  Barriers to Discharge        Equipment Recommendations       Recommendations for Other Services    Frequency     Plan Discharge plan remains appropriate;Frequency remains appropriate    Precautions / Restrictions     Pertinent Vitals/Pain     Mobility  Bed Mobility Bed Mobility: Supine to Sit;Sit to Supine Supine to Sit: 4: Min assist;HOB elevated;With rails (HOB elevated to the max) Sit to Supine: 4: Min assist Details for Bed Mobility Assistance: neds assist to both LEs to lift into bed Transfers Transfers: Stand Pivot Transfers Sit to Stand: 4: Min guard;From chair/3-in-1 Stand to Sit: 5: Supervision;With upper extremity assist;To bed Stand Pivot Transfers: 4: Min assist Details for Transfer Assistance: transfer very slow and labored...instructed in moving anteriorly in the chair to facilitate standing Ambulation/Gait Ambulation/Gait Assistance: 4: Min assist Ambulation Distance (Feet): 3 Feet Assistive device: Rolling walker Ambulation/Gait Assistance Details: pt able to move LEs withslightly less difficulty this afternoon...she was able tp recall hip hiking R in order to move RLE and was able to bear min weight on RLE during advancement of LLE Gait Pattern: Step-to pattern;Decreased stance time - right;Decreased step length -  left;Decreased weight shift to right Gait velocity: very slow and labored General Gait Details: instead of transfer bed to  chair  taking 4 min (as it did this AM), it only took about 2 min Stairs: No Wheelchair Mobility Wheelchair Mobility: No    Exercises Total Joint Exercises Ankle Circles/Pumps: AROM;Both;10 reps;Supine Quad Sets: AROM;Both;10 reps;Supine Short Arc Quad: AAROM;Right;10 reps;Supine Heel Slides: AAROM;Right;10 reps;Supine Goniometric ROM: 5-70  deg AA R knee   PT Diagnosis:    PT Problem List:   PT Treatment Interventions:     PT Goals Acute Rehab PT Goals PT Goal: Supine/Side to Sit - Progress: Progressing toward goal PT Goal: Sit to Supine/Side - Progress: Progressing toward goal PT Goal: Sit to Stand - Progress: Progressing toward goal PT Goal: Stand to Sit - Progress: Progressing toward goal Pt will Ambulate: 1 - 15 feet PT Goal: Ambulate - Progress: Revised due to lack of progress  Visit Information  Last PT Received On: 07/03/12    Subjective Data  Subjective: no c/o   Cognition  Overall Cognitive Status: Appears within functional limits for tasks assessed/performed Arousal/Alertness: Lethargic Orientation Level: Appears intact for tasks assessed Behavior During Session: Lethargic Cognition - Other Comments: pt falls asleep frequently...even when talking on the phone    Balance  Balance Balance Assessed: No  End of Session PT - End of Session Equipment Utilized During Treatment: Gait belt Activity Tolerance: Other (comment) (still very drowsy) Patient left: in bed;with call bell/phone within reach;in CPM Nurse Communication: Mobility status CPM Right Knee CPM Right Knee: On Right Knee Flexion (Degrees): 70  Right Knee Extension (  Degrees): 0    GP     Myrlene Broker L 07/03/2012, 1:07 PM

## 2012-07-03 NOTE — Clinical Social Work Placement (Signed)
Clinical Social Work Department CLINICAL SOCIAL WORK PLACEMENT NOTE 07/03/2012  Patient:  Diane [name], Diane [name]  Account Number:  [id number] Admit date:  07/01/2012  Clinical Social Worker:  Derenda Fennel, LCSW  Date/time:  07/03/2012 11:20 AM  Clinical Social Work is seeking post-discharge placement for this patient at the following level of care:   SKILLED NURSING   (*CSW will update this form in Epic as items are completed)   07/03/2012  Patient/family provided with [facility] Department of Clinical Social Work's list of facilities offering this level of care within the geographic area requested by the patient (or if unable, by the patient's family).  07/03/2012  Patient/family informed of their freedom to choose among providers that offer the needed level of care, that participate in Medicare, Medicaid or managed care program needed by the patient, have an available bed and are willing to accept the patient.  07/03/2012  Patient/family informed of [facility]' ownership interest in [facility], as well as of the fact that they are under no obligation to receive care at this facility.  PASARR submitted to EDS on 07/03/2012 PASARR number received from [employer] on   FL2 transmitted to all facilities in geographic area requested by pt/family on  07/03/2012 FL2 transmitted to all facilities within larger geographic area on   Patient informed that his/her managed care company has contracts with or will negotiate with  certain facilities, including the following:     Patient/family informed of bed offers received:   Patient chooses bed at  Physician recommends and patient chooses bed at    Patient to be transferred to  on   Patient to be transferred to facility by   The following physician request were entered in Epic:   Additional Comments:  Derenda Fennel, LCSW [phone]

## 2012-07-03 NOTE — Progress Notes (Signed)
Physical Therapy Treatment Patient Details Name: Diane [name] MRN: [id number] DOB: December 21, 1950 Today's Date: 07/03/2012 Time: 1308-6578 PT Time Calculation (min): 58 min  PT Assessment / Plan / Recommendation Comments on Treatment Session  Pt continues to be lethargic, but is able to participate in PT.  She is making painfully slow progress with gait and is only able to use walker to fo from bed to chair.  She was instructed in pivot transfer to [facility] and only needed min assist, although transfer was very slow and labored.  ROM is improving and we were able to get 6-75 deg ROM R knee with stretching.  Coverderm was placed over incision and I have asked nursing service to order Xlarge/regular thigh high TED for RLE from materials.  Immobilizer is  being used fro all activity out of bed.  At this point, I believe that her progress is so slow that she will need to go to SNF at d/c.  I discussed this with her and she is reluctantly agreeable.       Follow Up Recommendations  Post acute inpatient     Does the patient have the potential to tolerate intense rehabilitation  No, Recommend SNF  Barriers to Discharge        Equipment Recommendations       Recommendations for Other Services    Frequency     Plan Discharge plan needs to be updated;Frequency remains appropriate    Precautions / Restrictions Restrictions Weight Bearing Restrictions: Yes   Pertinent Vitals/Pain     Mobility  Bed Mobility Bed Mobility: Supine to Sit;Sit to Supine Supine to Sit: 4: Min assist;HOB elevated;With rails (HOB elevated to the max) Sit to Supine: Not Tested (comment) Transfers Transfers: Stand Pivot Transfers Sit to Stand: 4: Min guard;From bed;With upper extremity assist (transfer is very slow and labored) Stand to Sit: To chair/3-in-1;With upper extremity assist;4: Min guard;Other (comment) (instructed to keep RLE forward as it is in immobilizer) Stand Pivot Transfers: 4: Min assist;Other (comment)  (verbal cues needed for each sequence of transfer) Ambulation/Gait Ambulation/Gait Assistance: 4: Min assist Ambulation Distance (Feet): 3 Feet Assistive device: Rolling walker Ambulation/Gait Assistance Details: pt has significant difficulty advancing either LE.  She was instructed in using quadratus lumborum on R in order to advance RLE, which she was finally able to do.  She is unable to carry much body weight on UEs and is therefore unable do take a full step with LLE.  She tries to "slide" it forward.  Gait is extremely slow and labored Gait Pattern: Step-to pattern;Decreased stance time - right;Decreased step length - left;Decreased hip/knee flexion - right;Decreased hip/knee flexion - left;Decreased weight shift to right General Gait Details: gait is essentially non-functional at this point Stairs: No Wheelchair Mobility Wheelchair Mobility: No    Exercises Total Joint Exercises Ankle Circles/Pumps: AROM;Both;10 reps;Supine Quad Sets: AROM;Both;10 reps;Supine Short Arc Quad: AAROM;Right;10 reps;Supine Heel Slides: AAROM;Left;10 reps;Supine Goniometric ROM: 6-75 deg AA R knee   PT Diagnosis:    PT Problem List:   PT Treatment Interventions:     PT Goals Acute Rehab PT Goals PT Goal: Supine/Side to Sit - Progress: Progressing toward goal PT Goal: Sit to Stand - Progress: Progressing toward goal PT Goal: Stand to Sit - Progress: Progressing toward goal PT Goal: Ambulate - Progress: Progressing toward goal  Visit Information  Last PT Received On: 07/03/12    Subjective Data  Subjective: no c/o   Cognition  Overall Cognitive Status: Appears within functional limits  for tasks assessed/performed Arousal/Alertness: Lethargic Orientation Level: Appears intact for tasks assessed Behavior During Session: Lethargic    Balance  Balance Balance Assessed: No  End of Session PT - End of Session Equipment Utilized During Treatment: Gait belt Activity Tolerance: Patient tolerated  treatment well Patient left: in chair;with call bell/phone within reach Nurse Communication: Mobility status   GP     Myrlene Broker L 07/03/2012, 10:01 AM

## 2012-07-03 NOTE — Progress Notes (Signed)
Occupational Therapy Treatment Patient Details Name: NADYA HOPWOOD MRN: [id number] DOB: [birth year] Today's Date: 07/03/2012 Time: 4098-1191 OT Time Calculation (min): 30 min  OT Assessment / Plan / Recommendation Comments on Treatment Session Pt sleeping upon therapy arrival. Pt stated that she needed to use toilet. Unhooked patient from CPM device and assisted to the bedside commode. Pt was very slow moving during transfer and still very sleepy. D/C plan has been updated to SNF due to slow progress.    Follow Up Recommendations  Skilled nursing facility       Equipment Recommendations  Tub/shower bench       Frequency Min 2X/week   Plan Discharge plan needs to be updated    Precautions / Restrictions Precautions Precautions: Knee;Fall   Pertinent Vitals/Pain 8/10 pain level in right knee. Ice applied and repositioned.    ADL  Toilet Transfer: Research scientist (life sciences) Method: Surveyor, minerals: Materials engineer and Hygiene: Performed;Supervision/safety Where Assessed - Engineer, mining and Hygiene: Standing Transfers/Ambulation Related to ADLs: Patient transfers at Supervision level with RW.      OT Goals ADL Goals Pt Will Transfer to Toilet: with supervision;3-in-1;with DME;Stand pivot transfer ADL Goal: Statistician - Progress: Met Pt Will Perform Toileting - Clothing Manipulation: Standing ADL Goal: Toileting - Clothing Manipulation - Progress: Met Pt Will Perform Toileting - Hygiene: with supervision;Sitting on 3-in-1 or toilet ADL Goal: Toileting - Hygiene - Progress: Met  Visit Information  Last OT Received On: 07/03/12 Assistance Needed: +1    Subjective Data  Subjective: "I could use the toilet." Patient Stated Goal: to go home.      Cognition  Overall Cognitive Status: Appears within functional limits for tasks assessed/performed Arousal/Alertness:  Lethargic Orientation Level: Appears intact for tasks assessed Behavior During Session: Lethargic Cognition - Other Comments: pt falls asleep frequently...even when talking on the phone    Mobility   Bed Mobility Supine to Sit: 4: Min assist;HOB elevated;With rails Sit to Supine: 4: Min assist;With rail;HOB elevated Details for Bed Mobility Assistance: Needed assist for support of RLE. Transfers Transfers: Sit to Stand;Stand to Sit Sit to Stand: 5: Supervision;From bed;With upper extremity assist Stand to Sit: 5: Supervision;To chair/3-in-1;With upper extremity assist Details for Transfer Assistance: Very slow moving during transfer.             End of Session OT - End of Session Equipment Utilized During Treatment: Other (comment) (rolling walker) Activity Tolerance: Patient limited by fatigue;Patient tolerated treatment well Patient left: in bed;with call bell/phone within reach CPM Right Knee CPM Right Knee: On Right Knee Flexion (Degrees): 70  Right Knee Extension (Degrees): 0     Limmie Patricia, OTR/L 07/03/2012, 4:11 PM

## 2012-07-03 NOTE — Progress Notes (Signed)
Patients O2 stats stayed in 76%-78% while asleep and even when i woke her up. i increased her O2 from 2L to 5L nasal cannula and told her to take good deep breaths in and out and stats came upto 95%

## 2012-07-03 NOTE — Clinical Social Work Note (Signed)
CSW presented bed offers and pt chooses [facility]. Facility notified by voicemail to begin [facility] authorization. [employer] representative also notified.   Derenda Fennel, [place] [phone]

## 2012-07-03 NOTE — Clinical Social Work Placement (Signed)
Clinical Social Work Department CLINICAL SOCIAL WORK PLACEMENT NOTE 07/03/2012  Patient:  Diane [name], Diane [name]  Account Number:  [id number] Admit date:  07/01/2012  Clinical Social Worker:  Derenda Fennel, LCSW  Date/time:  07/03/2012 11:20 AM  Clinical Social Work is seeking post-discharge placement for this patient at the following level of care:   SKILLED NURSING   (*CSW will update this form in Epic as items are completed)   07/03/2012  Patient/family provided with [facility] Department of Clinical Social Work's list of facilities offering this level of care within the geographic area requested by the patient (or if unable, by the patient's family).  07/03/2012  Patient/family informed of their freedom to choose among providers that offer the needed level of care, that participate in Medicare, Medicaid or managed care program needed by the patient, have an available bed and are willing to accept the patient.  07/03/2012  Patient/family informed of [facility]' ownership interest in [facility], as well as of the fact that they are under no obligation to receive care at this facility.  PASARR submitted to EDS on 07/03/2012 PASARR number received from EDS on   FL2 transmitted to all facilities in geographic area requested by pt/family on  07/03/2012 FL2 transmitted to all facilities within larger geographic area on   Patient informed that his/her managed care company has contracts with or will negotiate with  certain facilities, including the following:     Patient/family informed of bed offers received:  07/03/2012 Patient chooses bed at [facility] SNF Physician recommends and patient chooses bed at  [facility] SNF  Patient to be transferred to  on   Patient to be transferred to facility by   The following physician request were entered in Epic:   Additional Comments:  Derenda Fennel, LCSW [phone]

## 2012-07-03 NOTE — Progress Notes (Signed)
Subjective: 2 Days Post-Op Procedure(s) (LRB): TOTAL KNEE ARTHROPLASTY (Right) Patient reports pain as moderate.    Objective: Vital signs in last 24 hours: Temp:  [97.8 F (36.6 C)-99.1 F (37.3 C)] 99.1 F (37.3 C) (10/23 0619) Pulse Rate:  [82-93] 93  (10/23 0619) Resp:  [16-20] 16  (10/23 0619) BP: (83-146)/(49-84) 146/77 mmHg (10/23 0619) SpO2:  [91 %-96 %] 94 % (10/23 0619)  Intake/Output from previous day: 10/22 0701 - 10/23 0700 In: 2524 [P.O.:920; I.V.:1604] Out: 1390 [Urine:1350; Drains:40] Intake/Output this shift: Total I/O In: -  Out: 350 [Urine:350]   Basename 07/03/12 0452 07/02/12 0531  HGB 10.4* 10.1*    Basename 07/03/12 0452 07/02/12 0531  WBC 6.0 6.1  RBC 3.71* 3.55*  HCT 33.1* 32.3*  PLT 135* 139*    Basename 07/02/12 0531  NA 141  K 4.4  CL 106  CO2 31  BUN 8  CREATININE 0.75  GLUCOSE 103*  CALCIUM 7.6*   No results found for this basename: LABPT:2,INR:2 in the last 72 hours  Neurologically intact Neurovascular intact Sensation intact distally Intact pulses distally Dorsiflexion/Plantar flexion intact Incision: scant drainage  Assessment/Plan: 2 Days Post-Op Procedure(s) (LRB): TOTAL KNEE ARTHROPLASTY (Right) Up with therapy D/C IV fluids May need snf  Fuller Canada 07/03/2012, 7:21 AM

## 2012-07-04 ENCOUNTER — Encounter (HOSPITAL_COMMUNITY): Payer: Self-pay | Admitting: Orthopedic Surgery

## 2012-07-04 LAB — CBC
HCT: 31.5 % — ABNORMAL LOW (ref 36.0–46.0)
Hemoglobin: 9.9 g/dL — ABNORMAL LOW (ref 12.0–15.0)
MCH: 28.2 pg (ref 26.0–34.0)
MCHC: 31.4 g/dL (ref 30.0–36.0)
MCV: 89.7 fL (ref 78.0–100.0)
Platelets: 138 10*3/uL — ABNORMAL LOW (ref 150–400)
RBC: 3.51 MIL/uL — ABNORMAL LOW (ref 3.87–5.11)
RDW: 13.5 % (ref 11.5–15.5)
WBC: 6.9 10*3/uL (ref 4.0–10.5)

## 2012-07-04 MED ORDER — HYDROCODONE-ACETAMINOPHEN 10-325 MG PO TABS: 1.0000 | ORAL_TABLET | ORAL | Status: DC | PRN

## 2012-07-04 MED ORDER — ASPIRIN 325 MG PO TBEC: 325.0000 mg | DELAYED_RELEASE_TABLET | Freq: Two times a day (BID) | ORAL | Status: DC

## 2012-07-04 NOTE — Clinical Social Work Placement (Signed)
Clinical Social Work Department CLINICAL SOCIAL WORK PLACEMENT NOTE 07/04/2012  Patient:  Diane [name], Diane [name]  Account Number:  [id number] Admit date:  07/01/2012  Clinical Social Worker:  Derenda Fennel, LCSW  Date/time:  07/03/2012 11:20 AM  Clinical Social Work is seeking post-discharge placement for this patient at the following level of care:   SKILLED NURSING   (*CSW will update this form in Epic as items are completed)   07/03/2012  Patient/family provided with [facility] Department of Clinical Social Work's list of facilities offering this level of care within the geographic area requested by the patient (or if unable, by the patient's family).  07/03/2012  Patient/family informed of their freedom to choose among providers that offer the needed level of care, that participate in Medicare, Medicaid or managed care program needed by the patient, have an available bed and are willing to accept the patient.  07/03/2012  Patient/family informed of [facility]' ownership interest in [facility], as well as of the fact that they are under no obligation to receive care at this facility.  PASARR submitted to EDS on 07/03/2012 PASARR number received from EDS on   FL2 transmitted to all facilities in geographic area requested by pt/family on  07/03/2012 FL2 transmitted to all facilities within larger geographic area on   Patient informed that his/her managed care company has contracts with or will negotiate with  certain facilities, including the following:     Patient/family informed of bed offers received:  07/03/2012 Patient chooses bed at [facility] SNF Physician recommends and patient chooses bed at  [facility] SN[facility]  Patient to be transferred to [facility] SNF on  07/04/2012 Patient to be transferred to facility by [facility]  The following physician request were entered in Epic:   Additional Comments:  Derenda Fennel, LCSW [phone]

## 2012-07-04 NOTE — Discharge Summary (Signed)
Physician Discharge Summary  Patient ID: Diane [name] MRN: [id number] DOB/AGE: [birth year] 62 y.o.  Admit date: 07/01/2012 Discharge date: 07/04/2012  Admission Diagnoses: oa right knee   Discharge Diagnoses: oa right knee Active Problems:  * No active hospital problems. *    Discharged Condition: fair  Hospital Course: Admitted for right total knee arthroplasty, underwent a right total knee replacement on October 21 with a [place] fixed-bearing Sigma posterior stabilized knee. Postoperative x-ray showed excellent alignment and position of the implant. Main problem and the hospital was somnolent secondary to Dilaudid, very sensitive to that medication. She was changed to oral Percocet still had periods of somnolence and is now changed over to Norco 10 mg. She was slow to progress with physical therapy and was recommended for skilled nursing facility.  Consults: respiratory therapy   Significant Diagnostic Studies: labs:  CBC    Component Value Date/Time   WBC 6.9 07/04/2012 0358   RBC 3.51* 07/04/2012 0358   HGB 9.9* 07/04/2012 0358   HCT 31.5* 07/04/2012 0358   PLT 138* 07/04/2012 0358   MCV 89.7 07/04/2012 0358   MCH 28.2 07/04/2012 0358   MCHC 31.4 07/04/2012 0358   RDW 13.5 07/04/2012 0358   LYMPHSABS 2.9 06/25/2012 1140   MONOABS 0.4 06/25/2012 1140   EOSABS 0.2 06/25/2012 1140   BASOSABS 0.1 06/25/2012 1140      Treatments: surgery: right tka  Discharge Exam: Blood pressure 135/77, pulse 87, temperature 98.1 F (36.7 C), temperature source Oral, resp. rate 18, height 5\' 4"  (1.626 m), weight 231 lb 7.7 oz (105 kg), SpO2 95.00%. General appearance: cooperative, no distress, morbidly obese and slowed mentation Incision/Wound:C/D/I  Disposition: 01-Home or Self Care  Discharge Orders    Future Orders Please Complete By Expires   Diet - low sodium heart healthy      Call MD / Call 911      Comments:   If you experience chest pain or shortness of breath, CALL  911 and be transported to the hospital emergency room.  If you develope a fever above 101 F, pus (white drainage) or increased drainage or redness at the wound, or calf pain, call your surgeon's office.   Constipation Prevention      Comments:   Drink plenty of fluids.  Prune juice may be helpful.  You may use a stool softener, such as Colace (over the counter) 100 mg twice a day.  Use MiraLax (over the counter) for constipation as needed.   Increase activity slowly as tolerated      Discharge instructions      Comments:   No lifting x 12 weeks No driving x 2 weeks Ted hose x 6 weeks   Staples out POD 12, Nov 2nd  cpm 0-70 6 hrs per day increase 10 degrees per day as tolerated   Driving restrictions      Comments:   No driving for 3 weeks   Lifting restrictions      Comments:   No lifting for 12 weeks   TED hose      Comments:   Use stockings (TED hose) for 6 weeks on both leg(s).  You may remove them at night for sleeping.   Change dressing      Comments:   Change dressing on fri, then change the dressing daily with sterile 4 x 4 inch gauze dressing and apply TED hose.  You may clean the incision with alcohol prior to redressing.   Do not put a  pillow under the knee. Place it under the heel.      CPM      Comments:   Continuous passive motion machine (CPM):      Use the CPM from 0 to 70 for 6 hours per day.      You may increase by 10 per day.  You may break it up into 2 or 3 sessions per day.      Use CPM for 3 weeks or until you are told to stop.       Medication List     As of 07/04/2012  8:09 AM    STOP taking these medications         HYDROcodone-acetaminophen 7.5-325 MG per tablet   Commonly known as: NORCO      TAKE these medications         ALPRAZolam 0.5 MG tablet   Commonly known as: XANAX   Take 0.5 mg by mouth 2 (two) times daily. For anxiety      amitriptyline 50 MG tablet   Commonly known as: ELAVIL   Take 1 tablet (50 mg total) by mouth at bedtime.       aspirin 325 MG EC tablet   Take 1 tablet (325 mg total) by mouth 2 (two) times daily.      cloNIDine 0.2 MG tablet   Commonly known as: CATAPRES   Take 0.2 mg by mouth at bedtime.      cyclobenzaprine 10 MG tablet   Commonly known as: FLEXERIL   Take 10 mg by mouth 3 (three) times daily. For spasms      divalproex 250 MG DR tablet   Commonly known as: DEPAKOTE   Take 750 mg by mouth at bedtime.      gabapentin 300 MG capsule   Commonly known as: NEURONTIN   Take 1 capsule (300 mg total) by mouth 3 (three) times daily.      HYDROcodone-acetaminophen 10-325 MG per tablet   Commonly known as: NORCO   Take 1 tablet by mouth every 4 (four) hours as needed.      LEVOTHROID 150 MCG tablet   Generic drug: levothyroxine   Take 150 mcg by mouth daily.      loratadine 10 MG tablet   Commonly known as: CLARITIN   Take 10 mg by mouth daily.      nitrofurantoin (macrocrystal-monohydrate) 100 MG capsule   Commonly known as: MACROBID   Take 100 mg by mouth 2 (two) times daily.      oxybutynin 5 MG tablet   Commonly known as: DITROPAN   Take 5 mg by mouth 2 (two) times daily.      promethazine 12.5 MG tablet   Commonly known as: PHENERGAN   Take 12.5 mg by mouth every 6 (six) hours as needed. For nausea      rosuvastatin 40 MG tablet   Commonly known as: CRESTOR   Take 40 mg by mouth daily.           Follow-up Information    Follow up with Fuller Canada, MD. On 07/15/2012.   Contact information:   [address] [place] [address] [phone]          Signed: Fuller Canada 07/04/2012, 8:09 AM

## 2012-07-04 NOTE — Progress Notes (Signed)
UR Chart Review Completed  

## 2012-07-04 NOTE — Progress Notes (Signed)
Physical Therapy Treatment Patient Details Name: Diane [name] MRN: [id number] DOB: Sep 19, 1950 Today's Date: 07/04/2012 Time: 1002-1043 PT Time Calculation (min): 41 min 2 ther activity  PT Assessment / Plan / Recommendation Comments on Treatment Session  patient not able to follow directions for AROM knee exerices due to extreme drowsiness;attempted PROM, however not possible due to patient tensing up. Patient was able to get to EOB from supine<>sit with Mod A and then transferred from bed<>BSC<>chair with Min A using RW;verbal cueing required for sequencing and backing to surfaces before sittin. Once in chair knee flex/ext stretches were performed    Follow Up Recommendations        Does the patient have the potential to tolerate intense rehabilitation     Barriers to Discharge        Equipment Recommendations       Recommendations for Other Services    Frequency     Plan      Precautions / Restrictions     Pertinent Vitals/Pain     Mobility  Bed Mobility Supine to Sit: 3: Mod assist;HOB elevated Transfers Sit to Stand: 4: Min guard Stand to Sit: 4: Min assist Ambulation/Gait Ambulation/Gait Assistance: 4: Min assist Ambulation Distance (Feet): 4 Feet Assistive device: Rolling walker Ambulation/Gait Assistance Details: steps taken from bed<>BSC<>chair Gait velocity: labored/slow Stairs: No Wheelchair Mobility Wheelchair Mobility: No    Exercises Other Exercises Other Exercises: seated knee flex and ext stretches 3 x 30"   PT Diagnosis:    PT Problem List:   PT Treatment Interventions:     PT Goals    Visit Information  Last PT Received On: 07/04/12    Subjective Data      Cognition       Balance     End of Session PT - End of Session Equipment Utilized During Treatment: Gait belt Activity Tolerance: Patient limited by fatigue;Other (comment) (drowsiness) Patient left: in chair;with call bell/phone within reach Nurse Communication: Mobility  status   GP     Gavina Dildine ATKINSO 07/04/2012, 10:58 AM

## 2012-07-04 NOTE — Progress Notes (Signed)
Physical Therapy Treatment Patient Details Name: Diane [name] MRN: [id number] DOB: April 14, 1951 Today's Date: 07/04/2012 Time: 1202-1218 PT Time Calculation (min): 16 min  PT Assessment / Plan / Recommendation Comments on Treatment Session  Pt needing to go to the bathroom.  She was instructed in bed to [facility] transfer, standing pivot method.  She needed continual cueing for placement of each LE and was only able to slide her feet to move them.    Follow Up Recommendations        Does the patient have the potential to tolerate intense rehabilitation     Barriers to Discharge        Equipment Recommendations       Recommendations for Other Services    Frequency     Plan      Precautions / Restrictions     Pertinent Vitals/Pain     Mobility  Bed Mobility Supine to Sit: 3: Mod assist;HOB elevated Transfers Sit to Stand: 4: Min guard Stand to Sit: 4: Min assist Stand Pivot Transfers: 4: Min assist Ambulation/Gait Ambulation/Gait Assistance: 4: Min assist Ambulation Distance (Feet): 4 Feet Assistive device: Rolling walker Ambulation/Gait Assistance Details: steps taken from bed<>BSC<>chair Gait velocity: labored/slow Stairs: No Wheelchair Mobility Wheelchair Mobility: No    Exercises Other Exercises Other Exercises: seated knee flex and ext stretches 3 x 30"   PT Diagnosis:    PT Problem List:   PT Treatment Interventions:     PT Goals    Visit Information  Last PT Received On: 07/04/12    Subjective Data      Cognition       Balance     End of Session PT - End of Session Equipment Utilized During Treatment: Gait belt Activity Tolerance: Patient limited by fatigue;Other (comment) (drowsiness) Patient left: in chair Nurse Communication: Mobility status   GP     Konrad Penta 07/04/2012, 12:33 PM

## 2012-07-04 NOTE — Clinical Social Work Note (Addendum)
Pt d/c today to [facility]. Facility received authorization for SNF from [facility]. Pt will require [facility] transport. Pt and facility aware and agreeable. D/C summary faxed. Melanie at [facility] is aware of 30 day pasarr and also said MD at facility will write script for Xanax. Script for hydrocodone in packet.  Derenda Fennel, [place] [phone]

## 2012-07-07 LAB — TYPE AND SCREEN
ABO/RH(D): O POS
Antibody Screen: NEGATIVE
Unit division: 0
Unit division: 0

## 2012-07-15 ENCOUNTER — Ambulatory Visit (INDEPENDENT_AMBULATORY_CARE_PROVIDER_SITE_OTHER): Payer: Medicare HMO | Admitting: Orthopedic Surgery

## 2012-07-15 ENCOUNTER — Encounter: Payer: Self-pay | Admitting: Orthopedic Surgery

## 2012-07-15 VITALS — Ht 63.0 in | Wt 233.0 lb

## 2012-07-15 DIAGNOSIS — Z96659 Presence of unspecified artificial knee joint: Secondary | ICD-10-CM

## 2012-07-15 MED ORDER — HYDROCODONE-ACETAMINOPHEN 10-325 MG PO TABS: 1.0000 | ORAL_TABLET | ORAL | Status: DC | PRN

## 2012-07-15 NOTE — Progress Notes (Signed)
Patient ID: Diane [name], female   DOB: 12-Jul-1951, 61 y.o.   MRN: [id number] Chief Complaint  Patient presents with  . Follow-up    Post op #1, right TKA. DOS 07-01-12.   Wound clean , staples out  DVT xarelto x 15 days   Current Outpatient Prescriptions on File Prior to Visit  Medication Sig Dispense Refill  . ALPRAZolam (XANAX) 0.5 MG tablet Take 0.5 mg by mouth 2 (two) times daily. For anxiety      . amitriptyline (ELAVIL) 50 MG tablet Take 1 tablet (50 mg total) by mouth at bedtime.  30 tablet  1  . aspirin EC 325 MG EC tablet Take 1 tablet (325 mg total) by mouth 2 (two) times daily.  90 tablet  0  . cloNIDine (CATAPRES) 0.2 MG tablet Take 0.2 mg by mouth at bedtime.       . cyclobenzaprine (FLEXERIL) 10 MG tablet Take 10 mg by mouth 3 (three) times daily. For spasms      . divalproex (DEPAKOTE) 250 MG DR tablet Take 750 mg by mouth at bedtime.      . gabapentin (NEURONTIN) 300 MG capsule Take 1 capsule (300 mg total) by mouth 3 (three) times daily.  90 capsule  1  . levothyroxine (LEVOTHROID) 150 MCG tablet Take 150 mcg by mouth daily.       Marland Kitchen loratadine (CLARITIN) 10 MG tablet Take 10 mg by mouth daily.       Marland Kitchen oxybutynin (DITROPAN) 5 MG tablet Take 5 mg by mouth 2 (two) times daily.       . promethazine (PHENERGAN) 12.5 MG tablet Take 12.5 mg by mouth every 6 (six) hours as needed. For nausea      . rosuvastatin (CRESTOR) 40 MG tablet Take 40 mg by mouth daily.        . nitrofurantoin, macrocrystal-monohydrate, (MACROBID) 100 MG capsule Take 100 mg by mouth 2 (two) times daily.

## 2012-07-15 NOTE — Patient Instructions (Signed)
Continue therapy   Ok to go home

## 2012-07-22 ENCOUNTER — Telehealth: Payer: Self-pay | Admitting: Orthopedic Surgery

## 2012-07-22 NOTE — Telephone Encounter (Signed)
Renae Fickle was not available, gave the reply to [employer]

## 2012-07-22 NOTE — Telephone Encounter (Signed)
Larina Bras, therapist at [facility] said Diane [name] will be going home soon and he is asking if she is to continue the CPM machine at home.  She is now at 95 degrees PROM. [facility] # [phone]  Ext [phone]

## 2012-07-22 NOTE — Telephone Encounter (Signed)
cpm is for 3 weeks from surgery

## 2012-07-29 ENCOUNTER — Telehealth: Payer: Self-pay | Admitting: Orthopedic Surgery

## 2012-07-29 NOTE — Telephone Encounter (Signed)
Patient called, states home from [facility] as of Wed, 07/24/12, and is awaiting home physical therapy.  Need updated referral to be entered, per her 07/15/12 office visit, in which instructions show:   Patient Instructions     Continue therapy  Ok to go home    Last referral entered was for status/post arthroscopy back in May, 2013.

## 2012-07-30 ENCOUNTER — Encounter (HOSPITAL_COMMUNITY): Payer: Self-pay

## 2012-07-30 ENCOUNTER — Ambulatory Visit (INDEPENDENT_AMBULATORY_CARE_PROVIDER_SITE_OTHER): Payer: Medicare HMO | Admitting: Orthopedic Surgery

## 2012-07-30 ENCOUNTER — Other Ambulatory Visit: Payer: Self-pay | Admitting: Orthopedic Surgery

## 2012-07-30 ENCOUNTER — Encounter: Payer: Self-pay | Admitting: Orthopedic Surgery

## 2012-07-30 ENCOUNTER — Encounter (HOSPITAL_COMMUNITY): Payer: Self-pay | Admitting: *Deleted

## 2012-07-30 ENCOUNTER — Inpatient Hospital Stay (HOSPITAL_COMMUNITY)
Admission: AD | Admit: 2012-07-30 | Discharge: 2012-08-02 | DRG: 560 | Disposition: A | Discharge status: Home Health | Payer: Medicare HMO | Source: Ambulatory Visit | Attending: Orthopedic Surgery | Admitting: Orthopedic Surgery

## 2012-07-30 VITALS — Ht 63.0 in | Wt 233.0 lb

## 2012-07-30 DIAGNOSIS — Z882 Allergy status to sulfonamides status: Secondary | ICD-10-CM

## 2012-07-30 DIAGNOSIS — E7800 Pure hypercholesterolemia, unspecified: Secondary | ICD-10-CM | POA: Diagnosis present

## 2012-07-30 DIAGNOSIS — Z96659 Presence of unspecified artificial knee joint: Secondary | ICD-10-CM

## 2012-07-30 DIAGNOSIS — I10 Essential (primary) hypertension: Secondary | ICD-10-CM | POA: Diagnosis present

## 2012-07-30 DIAGNOSIS — F411 Generalized anxiety disorder: Secondary | ICD-10-CM | POA: Diagnosis present

## 2012-07-30 DIAGNOSIS — Z888 Allergy status to other drugs, medicaments and biological substances status: Secondary | ICD-10-CM

## 2012-07-30 DIAGNOSIS — F329 Major depressive disorder, single episode, unspecified: Secondary | ICD-10-CM | POA: Diagnosis present

## 2012-07-30 DIAGNOSIS — G8929 Other chronic pain: Secondary | ICD-10-CM | POA: Diagnosis present

## 2012-07-30 DIAGNOSIS — E039 Hypothyroidism, unspecified: Secondary | ICD-10-CM | POA: Diagnosis present

## 2012-07-30 DIAGNOSIS — T8149XA Infection following a procedure, other surgical site, initial encounter: Secondary | ICD-10-CM | POA: Insufficient documentation

## 2012-07-30 DIAGNOSIS — L03119 Cellulitis of unspecified part of limb: Secondary | ICD-10-CM | POA: Diagnosis present

## 2012-07-30 DIAGNOSIS — Z9089 Acquired absence of other organs: Secondary | ICD-10-CM

## 2012-07-30 DIAGNOSIS — T8140XA Infection following a procedure, unspecified, initial encounter: Secondary | ICD-10-CM

## 2012-07-30 DIAGNOSIS — Y9210 Unspecified residential institution as the place of occurrence of the external cause: Secondary | ICD-10-CM | POA: Diagnosis present

## 2012-07-30 DIAGNOSIS — L02419 Cutaneous abscess of limb, unspecified: Secondary | ICD-10-CM | POA: Diagnosis present

## 2012-07-30 DIAGNOSIS — Z79899 Other long term (current) drug therapy: Secondary | ICD-10-CM

## 2012-07-30 DIAGNOSIS — G4730 Sleep apnea, unspecified: Secondary | ICD-10-CM | POA: Diagnosis present

## 2012-07-30 DIAGNOSIS — E876 Hypokalemia: Secondary | ICD-10-CM | POA: Diagnosis present

## 2012-07-30 DIAGNOSIS — Z90710 Acquired absence of both cervix and uterus: Secondary | ICD-10-CM

## 2012-07-30 DIAGNOSIS — Z880 Allergy status to penicillin: Secondary | ICD-10-CM

## 2012-07-30 DIAGNOSIS — F3289 Other specified depressive episodes: Secondary | ICD-10-CM | POA: Diagnosis present

## 2012-07-30 DIAGNOSIS — Z87891 Personal history of nicotine dependence: Secondary | ICD-10-CM

## 2012-07-30 DIAGNOSIS — M549 Dorsalgia, unspecified: Secondary | ICD-10-CM | POA: Diagnosis present

## 2012-07-30 DIAGNOSIS — K219 Gastro-esophageal reflux disease without esophagitis: Secondary | ICD-10-CM | POA: Diagnosis present

## 2012-07-30 DIAGNOSIS — M129 Arthropathy, unspecified: Secondary | ICD-10-CM | POA: Diagnosis present

## 2012-07-30 DIAGNOSIS — J309 Allergic rhinitis, unspecified: Secondary | ICD-10-CM | POA: Diagnosis present

## 2012-07-30 DIAGNOSIS — T8450XA Infection and inflammatory reaction due to unspecified internal joint prosthesis, initial encounter: Principal | ICD-10-CM | POA: Diagnosis present

## 2012-07-30 DIAGNOSIS — Y831 Surgical operation with implant of artificial internal device as the cause of abnormal reaction of the patient, or of later complication, without mention of misadventure at the time of the procedure: Secondary | ICD-10-CM | POA: Diagnosis present

## 2012-07-30 LAB — BASIC METABOLIC PANEL
BUN: 5 mg/dL — ABNORMAL LOW (ref 6–23)
CO2: 31 mEq/L (ref 19–32)
Calcium: 9.1 mg/dL (ref 8.4–10.5)
Chloride: 101 mEq/L (ref 96–112)
Creatinine, Ser: 0.75 mg/dL (ref 0.50–1.10)
GFR calc Af Amer: >90 mL/min (ref >90)
GFR calc non Af Amer: 89 mL/min — ABNORMAL LOW (ref >90)
Glucose, Bld: 109 mg/dL — ABNORMAL HIGH (ref 70–99)
Potassium: 3.1 mEq/L — ABNORMAL LOW (ref 3.5–5.1)
Sodium: 140 mEq/L (ref 135–145)

## 2012-07-30 LAB — CBC WITH DIFFERENTIAL/PLATELET
Basophils Absolute: 0 10*3/uL (ref 0.0–0.1)
Basophils Relative: 0 % (ref 0–1)
Eosinophils Absolute: 0.1 10*3/uL (ref 0.0–0.7)
Eosinophils Relative: 2 % (ref 0–5)
HCT: 37.4 % (ref 36.0–46.0)
Hemoglobin: 11.5 g/dL — ABNORMAL LOW (ref 12.0–15.0)
Lymphocytes Relative: 26 % (ref 12–46)
Lymphs Abs: 1.9 10*3/uL (ref 0.7–4.0)
MCH: 27.3 pg (ref 26.0–34.0)
MCHC: 30.7 g/dL (ref 30.0–36.0)
MCV: 88.8 fL (ref 78.0–100.0)
Monocytes Absolute: 0.7 10*3/uL (ref 0.1–1.0)
Monocytes Relative: 9 % (ref 3–12)
Neutro Abs: 4.6 10*3/uL (ref 1.7–7.7)
Neutrophils Relative %: 63 % (ref 43–77)
Platelets: 174 10*3/uL (ref 150–400)
RBC: 4.21 MIL/uL (ref 3.87–5.11)
RDW: 14.2 % (ref 11.5–15.5)
WBC: 7.4 10*3/uL (ref 4.0–10.5)

## 2012-07-30 LAB — SEDIMENTATION RATE: Sed Rate: 39 mm/hr — ABNORMAL HIGH (ref 0–22)

## 2012-07-30 MED ORDER — OXYCODONE-ACETAMINOPHEN 5-325 MG PO TABS
1.0000 | ORAL_TABLET | ORAL | Status: DC | PRN
  Administered 2012-07-30 – 2012-08-01 (×7): 1 via ORAL
  Filled 2012-07-30 (×8): qty 1

## 2012-07-30 MED ORDER — ONDANSETRON HCL 4 MG PO TABS: 4.0000 mg | ORAL_TABLET | Freq: Four times a day (QID) | ORAL | Status: DC | PRN

## 2012-07-30 MED ORDER — DOCUSATE SODIUM 100 MG PO CAPS
100.0000 mg | ORAL_CAPSULE | Freq: Two times a day (BID) | ORAL | Status: DC
  Administered 2012-07-30 – 2012-08-02 (×7): 100 mg via ORAL
  Filled 2012-07-30 (×7): qty 1

## 2012-07-30 MED ORDER — ALUM & MAG HYDROXIDE-SIMETH 200-200-20 MG/5ML PO SUSP: 30.0000 mL | Freq: Four times a day (QID) | ORAL | Status: DC | PRN

## 2012-07-30 MED ORDER — ONDANSETRON HCL 4 MG/2ML IJ SOLN: 4.0000 mg | Freq: Four times a day (QID) | INTRAMUSCULAR | Status: DC | PRN

## 2012-07-30 MED ORDER — VANCOMYCIN HCL IN DEXTROSE 1-5 GM/200ML-% IV SOLN
1000.0000 mg | Freq: Two times a day (BID) | INTRAVENOUS | Status: DC
  Administered 2012-07-30 – 2012-07-31 (×2): 1000 mg via INTRAVENOUS
  Filled 2012-07-30 (×3): qty 200

## 2012-07-30 NOTE — Telephone Encounter (Signed)
ORDER MADE SEND TO HOSPITAL AND TELL PATIENT TO CALL   IF WANTS HOME PT   CALL [facility] AND FAX DEMOGRAPHIC SHEETS

## 2012-07-30 NOTE — Progress Notes (Signed)
Pt came onto floor today as direct admit from Dr. Mort Sawyers office. Pt came in with infection to R knee. S/P R knee arthroplasty. Orders to be received. Pt is in minimal pain and NAD at this time. Will continue to monitor.

## 2012-07-30 NOTE — Progress Notes (Signed)
Patient ID: Diane [name], female   DOB: Apr 16, 1951, 61 y.o.   MRN: [id number] Chief Complaint  Patient presents with  . Follow-up    Recheck on right knee.    Status post RIGHT total knee arthroplasty.  Patient with morbid nursing center was discharged. She had difficulty getting home therapy and home nursing care. She called the office complaining of wound drainage. She was brought in for reevaluation.  She is cellulitis around the wound and expressible serous drainage in the proximal part of the wound with surrounding erythema. She does no appear o have join effusion.She has passive range of moion of 85 with no rigidity or pain.  Recommend admission for IV antibiotics, PICC line, vancomycin, appropriate lab work.  We can get her home health arranged while she is at the hospital.  Diagnosis postoperative wound infection.

## 2012-07-30 NOTE — Telephone Encounter (Signed)
07/30/12 UPDATE - See clinical notes.  Patient came today for office visit; left directly from office to [facility]/admit per Dr. Romeo Apple.    Note:  [facility] care nurse, Nyoka Lint, Ph [phone], had called our office this morning about home therapy and ADL orders, states they had gone out to see patient this morning; states had seen drainage.  As noted, patient was immediately scheduled to come for appointment this morning.

## 2012-07-30 NOTE — Patient Instructions (Signed)
Direct admit to hospital 

## 2012-07-30 NOTE — Progress Notes (Signed)
PICC insertion in right arm attempted but unsuccessful. Peripheral IV started in right forearm for access.. Will attempt left arm PICC insertion tomorrow if patient agrees.

## 2012-07-30 NOTE — H&P (Signed)
Diane [name] is an 61 y.o. female.   Chief Complaint: Drainage from right knee wound HPI: 61 year old female had uncomplicated right total knee arthroplasty with a Sigma fixed bearing posterior stabilized total knee replacement on October 21. She was discharged [facility] in good condition. She was discharged home from the nursing home on Monday and presents today with drainage from the right knee wound and the proximal aspect with a surrounding cellulitis.  She does have increased knee pain over the last 2 days. Her pain management is with Norco 10 mg every 4 when necessary. She was treated with aspirin in the hospital and then switched over to [facility] for the balance of 15 days for DVT prophylaxis. At the first postoperative visit postop day #14 the patient was in good condition and stable wound which is clean dry and intact no surrounding erythema or drainage.  Past Medical History  Diagnosis Date  . Hypertension   . Allergic rhinitis   . Anxiety   . Depression   . GERD (gastroesophageal reflux disease)   . High cholesterol   . Hypothyroid   . Chronic back pain   . Arthritis   . Immature cataract   . Sleep apnea     STOP BANG score=4  . Seizures 20 yrs ago    unknown etiology-on meds  . Headaches, cluster 3 yrs ago    last one    Past Surgical History  Procedure Date  . Left foot x 2  . Foot surgery     left foot-  . Partial hysterectomy   . Oophoectomy bilateral  . Cholecystectomy   . Lumbar disc surgery L4-5  . Back surgery   . Knee arthroscopy 01/2012    right  . Total knee arthroplasty 07/01/2012    Procedure: TOTAL KNEE ARTHROPLASTY;  Surgeon: Vickki Hearing, MD;  Location: [facility];  Service: Orthopedics;  Laterality: Right;    Family History  Problem Relation Age of Onset  . Heart disease    . Diabetes    . Alcohol abuse     Social History:  reports that she quit smoking about 9 years ago. Her smoking use included Cigarettes. She has a 1.25  pack-year smoking history. She does not have any smokeless tobacco history on file. She reports that she drinks alcohol. She reports that she does not use illicit drugs.  Allergies:  Allergies  Allergen Reactions  . Neomycin-Bacitracin Zn-Polymyx Other (See Comments)    Reaction: skin starts to become raw and peels off  . Penicillins Hives  . Sulfonamide Derivatives Hives and Swelling    Medications Prior to Admission  Medication Sig Dispense Refill  . ALPRAZolam (XANAX) 0.5 MG tablet Take 0.5 mg by mouth 2 (two) times daily. For anxiety      . amitriptyline (ELAVIL) 50 MG tablet Take 1 tablet (50 mg total) by mouth at bedtime.  30 tablet  1  . cloNIDine (CATAPRES) 0.2 MG tablet Take 0.2 mg by mouth at bedtime.       . cyclobenzaprine (FLEXERIL) 10 MG tablet Take 10 mg by mouth 3 (three) times daily. For spasms      . divalproex (DEPAKOTE) 250 MG DR tablet Take 750 mg by mouth at bedtime.      . gabapentin (NEURONTIN) 300 MG capsule Take 1 capsule (300 mg total) by mouth 3 (three) times daily.  90 capsule  1  . HYDROcodone-acetaminophen (NORCO) 10-325 MG per tablet Take 1 tablet by mouth every 4 (  four) hours as needed.  84 tablet  1  . levothyroxine (LEVOTHROID) 150 MCG tablet Take 150 mcg by mouth daily.       Marland Kitchen loratadine (CLARITIN) 10 MG tablet Take 10 mg by mouth daily.       . Multiple Vitamin (MULTIVITAMIN WITH MINERALS) TABS Take 1 tablet by mouth daily.      Marland Kitchen oxybutynin (DITROPAN) 5 MG tablet Take 5 mg by mouth 2 (two) times daily.       . rosuvastatin (CRESTOR) 40 MG tablet Take 40 mg by mouth daily.          Results for orders placed during the hospital encounter of 07/30/12 (from the past 48 hour(s))  BASIC METABOLIC PANEL     Status: Abnormal   Collection Time   07/30/12 11:40 AM      Component Value Range Comment   Sodium 140  135 - 145 mEq/L    Potassium 3.1 (*) 3.5 - 5.1 mEq/L    Chloride 101  96 - 112 mEq/L    CO2 31  19 - 32 mEq/L    Glucose, Bld 109 (*) 70 - 99  mg/dL    BUN 5 (*) 6 - 23 mg/dL    Creatinine, Ser 4.09  0.50 - 1.10 mg/dL    Calcium 9.1  8.4 - 81.1 mg/dL    GFR calc non Af Amer 89 (*) >90 mL/min    GFR calc Af Amer >90  >90 mL/min   CBC WITH DIFFERENTIAL     Status: Abnormal   Collection Time   07/30/12 11:40 AM      Component Value Range Comment   WBC 7.4  4.0 - 10.5 K/uL    RBC 4.21  3.87 - 5.11 MIL/uL    Hemoglobin 11.5 (*) 12.0 - 15.0 g/dL    HCT 91.4  78.2 - 95.6 %    MCV 88.8  78.0 - 100.0 fL    MCH 27.3  26.0 - 34.0 pg    MCHC 30.7  30.0 - 36.0 g/dL    RDW 21.3  08.6 - 57.8 %    Platelets 174  150 - 400 K/uL    Neutrophils Relative 63  43 - 77 %    Neutro Abs 4.6  1.7 - 7.7 K/uL    Lymphocytes Relative 26  12 - 46 %    Lymphs Abs 1.9  0.7 - 4.0 K/uL    Monocytes Relative 9  3 - 12 %    Monocytes Absolute 0.7  0.1 - 1.0 K/uL    Eosinophils Relative 2  0 - 5 %    Eosinophils Absolute 0.1  0.0 - 0.7 K/uL    Basophils Relative 0  0 - 1 %    Basophils Absolute 0.0  0.0 - 0.1 K/uL    No results found.  Review of Systems  Constitutional: Positive for malaise/fatigue. Negative for fever, chills, weight loss and diaphoresis.  Eyes: Negative for blurred vision.  Respiratory: Negative for cough.   Cardiovascular: Negative for chest pain.  Gastrointestinal: Negative for heartburn.  Genitourinary: Negative for dysuria.  Musculoskeletal: Positive for myalgias and joint pain. Negative for falls.  Skin: Negative for itching and rash.  Neurological: Negative for dizziness, tingling, tremors, sensory change, speech change, focal weakness, seizures, loss of consciousness, weakness and headaches.  Endo/Heme/Allergies: Negative for environmental allergies and polydipsia. Does not bruise/bleed easily.  Psychiatric/Behavioral: Positive for depression. Negative for suicidal ideas, hallucinations, memory loss and substance abuse. The patient  is not nervous/anxious and does not have insomnia.     Blood pressure 150/84, pulse 98,  temperature 99.4 F (37.4 C), temperature source Oral, resp. rate 20, height 5\' 3"  (1.6 m), weight 232 lb 12.9 oz (105.6 kg), SpO2 96.00%. Physical Exam  Constitutional: She is oriented to person, place, and time. She appears well-developed and well-nourished. No distress.  HENT:  Head: Atraumatic.  Eyes: Conjunctivae normal and EOM are normal. Pupils are equal, round, and reactive to light.  Neck: Normal range of motion. Neck supple. No JVD present. No tracheal deviation present. No thyromegaly present.  Cardiovascular: Normal rate and intact distal pulses.   Respiratory: Effort normal. No stridor. No respiratory distress. She exhibits no tenderness.  GI: She exhibits no distension and no mass. There is no guarding.  Musculoskeletal:       Right shoulder: Normal.       Left shoulder: Normal.       Right hip: Normal.       Left hip: Normal.       Right knee: She exhibits swelling, erythema and abnormal alignment. She exhibits no effusion, no ecchymosis, no deformity, no laceration, no LCL laxity, normal patellar mobility, no bony tenderness, normal meniscus and no MCL laxity. tenderness found. No medial joint line, no lateral joint line, no MCL, no LCL and no patellar tendon tenderness noted.       Left knee: Normal.       Left ankle: Normal.       Right upper arm: Normal.       Left upper arm: Normal.       Right upper leg: Normal.       Left upper leg: Normal.       Left lower leg: Normal.       Legs:      Left foot: Normal.  Lymphadenopathy:    She has no cervical adenopathy.  Neurological: She is alert and oriented to person, place, and time. She has normal reflexes. She displays normal reflexes. No cranial nerve deficit. She exhibits normal muscle tone. Coordination abnormal.  Skin: Skin is warm. No rash noted. She is not diaphoretic. There is erythema. No pallor.       Right knee incision is intact except for a pinpoint area at the proximal portion of the wound. There is  surrounding erythema next possible serous drainage. There is tenderness around the wound.  Psychiatric: She has a normal mood and affect. Judgment and thought content normal.       She seems very depressed and at times unfocused     Assessment/Plan Postoperative wound cellulitis Status post right total knee  IV antibiotics  wound care  laboratory studies  PICC line  Fuller Canada 07/30/2012, 2:05 PM

## 2012-07-31 ENCOUNTER — Inpatient Hospital Stay (HOSPITAL_COMMUNITY): Payer: Medicare HMO

## 2012-07-31 LAB — VANCOMYCIN, TROUGH: Vancomycin Tr: 28.9 ug/mL (ref 10.0–20.0)

## 2012-07-31 LAB — C-REACTIVE PROTEIN: CRP: 2.6 mg/dL — ABNORMAL HIGH (ref <0.60)

## 2012-07-31 LAB — MRSA PCR SCREENING: MRSA by PCR: NEGATIVE

## 2012-07-31 MED ORDER — CLONIDINE HCL 0.2 MG PO TABS
0.2000 mg | ORAL_TABLET | Freq: Every day | ORAL | Status: DC
  Administered 2012-07-31 – 2012-08-01 (×2): 0.2 mg via ORAL
  Filled 2012-07-31 (×2): qty 1

## 2012-07-31 MED ORDER — ATORVASTATIN CALCIUM 40 MG PO TABS
80.0000 mg | ORAL_TABLET | Freq: Every day | ORAL | Status: DC
  Administered 2012-08-01: 80 mg via ORAL
  Filled 2012-07-31: qty 2

## 2012-07-31 MED ORDER — OXYBUTYNIN CHLORIDE 5 MG PO TABS
5.0000 mg | ORAL_TABLET | Freq: Two times a day (BID) | ORAL | Status: DC
  Administered 2012-07-31 – 2012-08-02 (×4): 5 mg via ORAL
  Filled 2012-07-31 (×4): qty 1

## 2012-07-31 MED ORDER — GABAPENTIN 300 MG PO CAPS
300.0000 mg | ORAL_CAPSULE | Freq: Three times a day (TID) | ORAL | Status: DC
  Administered 2012-07-31 – 2012-08-02 (×5): 300 mg via ORAL
  Filled 2012-07-31 (×5): qty 1

## 2012-07-31 MED ORDER — AMITRIPTYLINE HCL 25 MG PO TABS
50.0000 mg | ORAL_TABLET | Freq: Every day | ORAL | Status: DC
  Administered 2012-07-31 – 2012-08-01 (×2): 50 mg via ORAL
  Filled 2012-07-31 (×2): qty 2

## 2012-07-31 MED ORDER — CYCLOBENZAPRINE HCL 10 MG PO TABS
10.0000 mg | ORAL_TABLET | Freq: Three times a day (TID) | ORAL | Status: DC
  Administered 2012-07-31 – 2012-08-02 (×5): 10 mg via ORAL
  Filled 2012-07-31 (×5): qty 1

## 2012-07-31 MED ORDER — DIVALPROEX SODIUM 250 MG PO DR TAB
750.0000 mg | DELAYED_RELEASE_TABLET | Freq: Every day | ORAL | Status: DC
  Administered 2012-07-31 – 2012-08-01 (×2): 750 mg via ORAL
  Filled 2012-07-31 (×3): qty 3

## 2012-07-31 MED ORDER — LEVOTHYROXINE SODIUM 75 MCG PO TABS
150.0000 ug | ORAL_TABLET | Freq: Every day | ORAL | Status: DC
  Administered 2012-08-01 – 2012-08-02 (×2): 150 ug via ORAL
  Filled 2012-07-31 (×2): qty 2

## 2012-07-31 MED ORDER — ALPRAZOLAM 0.5 MG PO TABS
0.5000 mg | ORAL_TABLET | Freq: Two times a day (BID) | ORAL | Status: DC
  Administered 2012-07-31 – 2012-08-02 (×4): 0.5 mg via ORAL
  Filled 2012-07-31 (×5): qty 1

## 2012-07-31 MED ORDER — LORATADINE 10 MG PO TABS
10.0000 mg | ORAL_TABLET | Freq: Every day | ORAL | Status: DC
  Administered 2012-08-01 – 2012-08-02 (×2): 10 mg via ORAL
  Filled 2012-07-31 (×2): qty 1

## 2012-07-31 MED ORDER — VANCOMYCIN HCL IN DEXTROSE 1-5 GM/200ML-% IV SOLN
1000.0000 mg | Freq: Two times a day (BID) | INTRAVENOUS | Status: DC
  Administered 2012-07-31 – 2012-08-02 (×5): 1000 mg via INTRAVENOUS
  Filled 2012-07-31 (×11): qty 200

## 2012-07-31 MED ORDER — ADULT MULTIVITAMIN W/MINERALS CH
1.0000 | ORAL_TABLET | Freq: Every day | ORAL | Status: DC
  Administered 2012-08-01 – 2012-08-02 (×2): 1 via ORAL
  Filled 2012-07-31 (×2): qty 1

## 2012-07-31 NOTE — Care Management Note (Addendum)
    Page 1 of 2   08/02/2012     3:16:01 PM   CARE MANAGEMENT NOTE 08/02/2012  Patient:  Diane [name], Diane [name]   Account Number:  [id number]  Date Initiated:  07/31/2012  Documentation initiated by:  Sharrie Rothman  Subjective/Objective Assessment:   Pt admitted from home with cellulitis to right knee incision. Pt lives at home and wants to return home at discharge. Pt may require IV AB at discharge.     Action/Plan:   CM discussed with pt about possible need for IV AB and options. Pt wants to go home with [facility]. Pt stated that she would have someone in the home that will be able to be taught how to do the IV AB. Will continue to monitor.   Anticipated DC Date:  08/02/2012   Anticipated DC Plan:  HOME W HOME HEALTH SERVICES      DC Planning Services  CM consult      [facility] Choice  HOME HEALTH   Choice offered to / List presented to:  C-1 Patient        HH arranged  HH-1 RN  HH-2 PT  IV Antibiotics      HH agency  [facility]   Status of service:  Completed, signed off Medicare Important Message given?  YES (If response is "NO", the following Medicare IM given date fields will be blank) Date Medicare IM given:  08/02/2012 Date Additional Medicare IM given:    Discharge Disposition:  HOME W HOME HEALTH SERVICES  Per UR Regulation:    If discussed at Long Length of Stay Meetings, dates discussed:    Comments:  08/02/12 1515 Arlyss Queen, RN BSN CM Pt friend Delorse Limber will be at pts home 0900 08/03/12 for education on administering IV AB to pt. Also pt niece, Towanda Malkin, will be available at [phone].  Tamala Bari is aware of this information.  08/02/12 1130 Arlyss Queen, RN BSN CM Pt discharged home today with IV AB and PT with [facility]. Tamala Bari of [facility] is aware and will collect the pts information from the chart. HH services will start 08/03/12. Pt wanted a shower chair, but [employer] will not pay for it. Pt made aware and will purchase her  own shower chair at the place of her choosing. Pt and pts nurse aware of discharge arrangements.  07/31/12 1515 Arlyss Queen, RN BSN CM

## 2012-07-31 NOTE — Evaluation (Signed)
Physical Therapy Evaluation Patient Details Name: Diane [name] MRN: [id number] DOB: 1950/12/31 Today's Date: 07/31/2012 Time: 4098-1191 PT Time Calculation (min): 35 min  PT Assessment / Plan / Recommendation Clinical Impression  Pt was seen for eval/tx.  She had been at home x 1 week following time at [facility] for tx of R TKR.  She states that she had been amanging well at home but is upset that HHPT never got started.  Her knee status is reasonable good with R knee ROM 15-90 deg and quad strength 3/5.  She does admit to resting with a pillow under her R knee and she was strongly encouraged to stop this action.      PT Assessment  Patient needs continued PT services    Follow Up Recommendations  Home health PT (follow up with OP PT as soon as possible)    Does the patient have the potential to tolerate intense rehabilitation      Barriers to Discharge        Equipment Recommendations  None recommended by PT    Recommendations for Other Services     Frequency Min 3X/week    Precautions / Restrictions Precautions Precautions: Knee Restrictions Weight Bearing Restrictions: No   Pertinent Vitals/Pain       Mobility  Bed Mobility Bed Mobility: Supine to Sit;Sit to Supine Supine to Sit: 7: Independent Sit to Supine: 7: Independent Transfers Transfers: Sit to Stand;Stand to Sit Sit to Stand: 7: Independent Stand to Sit: 7: Independent Ambulation/Gait Ambulation/Gait Assistance: 6: Modified independent (Device/Increase time) Ambulation Distance (Feet): 300 Feet Assistive device: Straight cane Gait Pattern: Step-through pattern;Decreased step length - right General Gait Details: instructed in decreasing step length R in order to equalize  Stairs: No Wheelchair Mobility Wheelchair Mobility: No    Shoulder Instructions     Exercises Total Joint Exercises Ankle Circles/Pumps: AROM;Both;10 reps;Supine Quad Sets: AROM;Both;10 reps;Supine Short Arc Quad: AROM;Right;10  reps;Supine Heel Slides: AAROM;10 reps;Supine Straight Leg Raises: AROM;Right;10 reps;Supine   PT Diagnosis: Abnormality of gait  PT Problem List: Decreased range of motion;Decreased strength PT Treatment Interventions: Gait training;Therapeutic exercise;Patient/family education   PT Goals Acute Rehab PT Goals PT Goal Formulation: With patient Time For Goal Achievement: 08/07/12 Potential to Achieve Goals: Good Pt will Ambulate: >150 feet;with modified independence;with cane (with even step length) PT Goal: Ambulate - Progress: Goal set today Pt will Perform Home Exercise Program: Independently  Visit Information  Last PT Received On: 07/31/12    Subjective Data  Subjective: the therapy at home never got started Patient Stated Goal: none stated   Prior Functioning  Home Living Lives With: Family Available Help at Discharge: Available PRN/intermittently Type of Home: Apartment Home Access: Level entry Home Layout: One level Bathroom Shower/Tub: Engineer, manufacturing systems: Standard Bathroom Accessibility: Yes Home Adaptive Equipment: Walker - rolling;Straight cane;Bedside commode/3-in-1 Prior Function Level of Independence: Needs assistance Needs Assistance: Bathing;Dressing (can't reach feet to bathe or dress) Able to Take Stairs?: Yes Driving: No Communication Communication: No difficulties    Cognition  Overall Cognitive Status: Appears within functional limits for tasks assessed/performed Arousal/Alertness: Awake/alert Orientation Level: Appears intact for tasks assessed Behavior During Session: [facility] for tasks performed Cognition - Other Comments: appears to have a sad affect    Extremity/Trunk Assessment Right Lower Extremity Assessment RLE ROM/Strength/Tone: Deficits RLE ROM/Strength/Tone Deficits: ROM  R knee is 16-90 deg, strength in quad =3/5 RLE Sensation: WFL - Light Touch RLE Coordination: WFL - gross motor Left Lower Extremity Assessment LLE  ROM/Strength/Tone: Within functional levels LLE Sensation: WFL - Light Touch LLE Coordination: WFL - gross motor   Balance Balance Balance Assessed: No  End of Session PT - End of Session Equipment Utilized During Treatment: Gait belt Activity Tolerance: Patient tolerated treatment well Patient left: in chair  GP     Myrlene Broker L 07/31/2012, 9:11 AM

## 2012-07-31 NOTE — Progress Notes (Signed)
Subjective: 8/10 pain last value was 2 / 10    Objective: Vital signs in last 24 hours: Temp:  [97.8 F (36.6 C)-99.4 F (37.4 C)] 97.8 F (36.6 C) (11/20 0617) Pulse Rate:  [80-98] 80  (11/20 0617) Resp:  [18-20] 18  (11/20 0617) BP: (123-150)/(48-84) 134/73 mmHg (11/20 0617) SpO2:  [92 %-96 %] 93 % (11/20 0617) Weight:  [232 lb 12.9 oz (105.6 kg)-233 lb (105.688 kg)] 232 lb 12.9 oz (105.6 kg) (11/19 1220)  Intake/Output from previous day: 11/19 0701 - 11/20 0700 In: 640 [P.O.:240; IV Piggyback:400] Out: -  Intake/Output this shift:     Basename 07/30/12 1140  HGB 11.5*    Basename 07/30/12 1140  WBC 7.4  RBC 4.21  HCT 37.4  PLT 174    Basename 07/30/12 1140  NA 140  K 3.1*  CL 101  CO2 31  BUN 5*  CREATININE 0.75  GLUCOSE 109*  CALCIUM 9.1   No results found for this basename: LABPT:2,INR:2 in the last 72 hours  Neurologically intact Neurovascular intact Sensation intact distally Intact pulses distally Dorsiflexion/Plantar flexion intact Incision: scant drainage, cellulitis  Assessment/Plan: Cr .75 Wbc 7.4 T afebrile vanc level 3 hrs post infusuion is 28  vanc pharmacy dosing    Fuller Canada 07/31/2012, 7:05 AM

## 2012-07-31 NOTE — Progress Notes (Signed)
ANTIBIOTIC CONSULT NOTE - INITIAL  Pharmacy Consult for Vancomycin Indication: cellulitis  Allergies  Allergen Reactions  . Neomycin-Bacitracin Zn-Polymyx Other (See Comments)    Reaction: skin starts to become raw and peels off  . Penicillins Hives  . Sulfonamide Derivatives Hives and Swelling    Patient Measurements: Height: 5\' 3"  (160 cm) Weight: 232 lb 12.9 oz (105.6 kg) IBW/kg (Calculated) : 52.4   Vital Signs: Temp: 97.8 F (36.6 C) (11/20 0617) Temp src: Oral (11/20 0617) BP: 134/73 mmHg (11/20 0617) Pulse Rate: 80  (11/20 0617) Intake/Output from previous day: 11/19 0701 - 11/20 0700 In: 640 [P.O.:240; IV Piggyback:400] Out: -  Intake/Output from this shift:    Labs:  Basename 07/30/12 1140  WBC 7.4  HGB 11.5*  PLT 174  LABCREA --  CREATININE 0.75   Estimated Creatinine Clearance: 84.8 ml/min (by C-G formula based on Cr of 0.75).  Basename 07/31/12 0450  VANCOTROUGH 28.9*  VANCOPEAK --  Drue Dun --  GENTTROUGH --  GENTPEAK --  GENTRANDOM --  TOBRATROUGH --  TOBRAPEAK --  TOBRARND --  AMIKACINPEAK --  AMIKACINTROU --  AMIKACIN --     Microbiology: No results found for this or any previous visit (from the past 720 hour(s)).  Medical History: Past Medical History  Diagnosis Date  . Hypertension   . Allergic rhinitis   . Anxiety   . Depression   . GERD (gastroesophageal reflux disease)   . High cholesterol   . Hypothyroid   . Chronic back pain   . Arthritis   . Immature cataract   . Sleep apnea     STOP BANG score=4  . Seizures 20 yrs ago    unknown etiology-on meds  . Headaches, cluster 3 yrs ago    last one    Medications:  Scheduled:    . docusate sodium  100 mg Oral BID  . vancomycin  1,000 mg Intravenous Q12H  . [DISCONTINUED] vancomycin  1,000 mg Intravenous Q12H   Assessment: 61yo female s/p right total knee arthroplasty now c/o cellulitis around the wound and serous drainage.  Admitted for IV ABX.  Vancomycin 1gm  was charted as given at 0200 and level was drawn after dose given therefore it is elevated as would be expected.  Pt is obese with good renal fxn.  Estimated Creatinine Clearance: 84.8 ml/min (by C-G formula based on Cr of 0.75).  Goal of Therapy:  Vancomycin trough level 10-15 mcg/ml  Plan: Vancomycin 1gm iv q12hrs Check a trough level at steady state Monitor labs, renal fxn, and cultures per protocl Duration of ABX per MD  Valrie Hart A 07/31/2012,7:34 AM

## 2012-07-31 NOTE — Progress Notes (Signed)
UR Chart Review Completed  

## 2012-07-31 NOTE — Procedures (Signed)
Central Venous Catheter Insertion Procedure Note GENAVIEVE MANGIAPANE [id number] July 19, 1951  Procedure: Insertion of Central Venous Catheter Indications: Assessment of intravascular volume, Drug and/or fluid administration and Frequent blood sampling  Procedure Details Consent: Risks of procedure as well as the alternatives and risks of each were explained to the (patient/caregiver).  Consent for procedure obtained. Time Out: Verified patient identification, verified procedure, site/side was marked, verified correct patient position, special equipment/implants available, medications/allergies/relevent history reviewed, required imaging and test results available.  Performed  Maximum sterile technique was used including antiseptics, cap, gloves, gown, hand hygiene, mask and sheet. Skin prep: Chlorhexidine; local anesthetic administered A antimicrobial bonded/coated triple lumen catheter was placed in the left subclavian vein using the Seldinger technique.  Evaluation Blood flow good Complications: No apparent complications Patient did tolerate procedure well. Anxious Chest X-ray ordered to verify placement.  CXR: pending.  Brittan Butterbaugh C 07/31/2012, 2:01 PM

## 2012-08-01 DIAGNOSIS — E876 Hypokalemia: Secondary | ICD-10-CM | POA: Diagnosis present

## 2012-08-01 LAB — VANCOMYCIN, TROUGH: Vancomycin Tr: 17.3 ug/mL (ref 10.0–20.0)

## 2012-08-01 MED ORDER — POTASSIUM CHLORIDE CRYS ER 20 MEQ PO TBCR
40.0000 meq | EXTENDED_RELEASE_TABLET | Freq: Two times a day (BID) | ORAL | Status: DC
  Administered 2012-08-01 – 2012-08-02 (×3): 40 meq via ORAL
  Filled 2012-08-01 (×3): qty 2

## 2012-08-01 MED ORDER — SODIUM CHLORIDE 0.9 % IJ SOLN
10.0000 mL | Freq: Two times a day (BID) | INTRAMUSCULAR | Status: DC
  Administered 2012-08-01 – 2012-08-02 (×3): 10 mL via INTRAVENOUS

## 2012-08-01 MED ORDER — SODIUM CHLORIDE 0.9 % IJ SOLN
10.0000 mL | INTRAMUSCULAR | Status: DC | PRN
  Administered 2012-08-02: 10 mL via INTRAVENOUS

## 2012-08-01 NOTE — Progress Notes (Signed)
Subjective: The patient is improved. The wound has improved. The cellulitis is resolving. Purulent drainage noted from the proximal portion of the wound.  Objective: Vital signs in last 24 hours: Temp:  [98 F (36.7 C)-98.3 F (36.8 C)] 98 F (36.7 C) (11/21 0435) Pulse Rate:  [77-80] 78  (11/21 0435) Resp:  [17-18] 17  (11/21 0435) BP: (100-151)/(66-83) 100/66 mmHg (11/21 0435) SpO2:  [94 %-98 %] 94 % (11/21 0435)  Intake/Output from previous day:   Intake/Output this shift:     Basename 07/30/12 1140  HGB 11.5*    Basename 07/30/12 1140  WBC 7.4  RBC 4.21  HCT 37.4  PLT 174    Basename 07/30/12 1140  NA 140  K 3.1*  CL 101  CO2 31  BUN 5*  CREATININE 0.75  GLUCOSE 109*  CALCIUM 9.1   No results found for this basename: LABPT:2,INR:2 in the last 72 hours  Incision: Moderate drainage noted from the proximal portion of the wound is purulent in nature. It appears to be superficial in the subcutaneous fat in it consistent with suture abscess.  Assessment/Plan: Wound cellulitis superficial infection status post knee replacement  On vancomycin  Vancomycin levels pending  Patient be discharged home with vancomycin.  Appreciate the line placed by Dr. Dian Situ secondary to inability to place a PICC line  Bank levels today discharge tomorrow  Wound was treated with wet-to-dry saline dressing and packed with a wet dressing.   Fuller Canada 08/01/2012, 7:05 AM

## 2012-08-01 NOTE — Progress Notes (Signed)
ANTIBIOTIC CONSULT NOTE   Pharmacy Consult for Vancomycin Indication: cellulitis  Allergies  Allergen Reactions  . Neomycin-Bacitracin Zn-Polymyx Other (See Comments)    Reaction: skin starts to become raw and peels off  . Penicillins Hives  . Sulfonamide Derivatives Hives and Swelling    Patient Measurements: Height: 5\' 3"  (160 cm) Weight: 232 lb 12.9 oz (105.6 kg) IBW/kg (Calculated) : 52.4   Vital Signs: Temp: 98 F (36.7 C) (11/21 0435) Temp src: Oral (11/21 0435) BP: 100/66 mmHg (11/21 0435) Pulse Rate: 78  (11/21 0435) Intake/Output from previous day:   Intake/Output from this shift:    Labs:  Basename 07/30/12 1140  WBC 7.4  HGB 11.5*  PLT 174  LABCREA --  CREATININE 0.75   Estimated Creatinine Clearance: 84.8 ml/min (by C-G formula based on Cr of 0.75).  Basename 08/01/12 0838 07/31/12 0450  VANCOTROUGH 17.3 28.9*  VANCOPEAK -- --  Drue Dun -- --  GENTTROUGH -- --  GENTPEAK -- --  GENTRANDOM -- --  TOBRATROUGH -- --  TOBRAPEAK -- --  TOBRARND -- --  AMIKACINPEAK -- --  AMIKACINTROU -- --  AMIKACIN -- --     Microbiology: Recent Results (from the past 720 hour(s))  MRSA PCR SCREENING     Status: Normal   Collection Time   07/31/12 11:57 AM      Component Value Range Status Comment   MRSA by PCR NEGATIVE  NEGATIVE Final     Medical History: Past Medical History  Diagnosis Date  . Hypertension   . Allergic rhinitis   . Anxiety   . Depression   . GERD (gastroesophageal reflux disease)   . High cholesterol   . Hypothyroid   . Chronic back pain   . Arthritis   . Immature cataract   . Sleep apnea     STOP BANG score=4  . Seizures 20 yrs ago    unknown etiology-on meds  . Headaches, cluster 3 yrs ago    last one    Medications:  Scheduled:     . ALPRAZolam  0.5 mg Oral BID  . amitriptyline  50 mg Oral QHS  . atorvastatin  80 mg Oral q1800  . cloNIDine  0.2 mg Oral QHS  . cyclobenzaprine  10 mg Oral TID  . divalproex  750  mg Oral QHS  . docusate sodium  100 mg Oral BID  . gabapentin  300 mg Oral TID  . levothyroxine  150 mcg Oral QAC breakfast  . loratadine  10 mg Oral Daily  . multivitamin with minerals  1 tablet Oral Daily  . oxybutynin  5 mg Oral BID  . potassium chloride  40 mEq Oral BID  . sodium chloride  10 mL Intravenous Q12H  . vancomycin  1,000 mg Intravenous Q12H   Assessment: 61yo female s/p right total knee arthroplasty now c/o cellulitis around the wound and serous drainage.  Admitted for IV ABX.  Vancomycin 1gm was charted as given at 0200 and level was drawn after dose given therefore it is elevated as would be expected.  Pt is obese with good renal fxn.  Estimated Creatinine Clearance: 84.8 ml/min (by C-G formula based on Cr of 0.75).  Trough level is at high end of therapeutic range but is acceptable.  Goal of Therapy:  Vancomycin trough level 10-15 mcg/ml  Plan: Continue Vancomycin 1gm iv q12hrs Check a trough level weekly while on Vancomycin Monitor labs, renal fxn, and cultures per protocl Duration of ABX per MD  Margo Aye,  Florabel Faulks A 08/01/2012,10:06 AM

## 2012-08-01 NOTE — Progress Notes (Signed)
Left arm evaluated for PICC placement. Only suitable vein identified was lodged between two arteries. No PICC insertion attempted.

## 2012-08-01 NOTE — Progress Notes (Signed)
Physical Therapy Treatment Patient Details Name: Diane [name] MRN: [id number] DOB: Feb 08, 1951 Today's Date: 08/01/2012 Time: 4098-1191 PT Time Calculation (min): 36 min  PT Assessment / Plan / Recommendation Comments on Treatment Session  Pt feels less discomfort in knee and has improved knee extension (ROM = 8-90 deg).  She is now ambulating with equal step lengths using a cane.  She was instructed as to how important it is for her to work on independent ther ex...written HEP was given.    Follow Up Recommendations        Does the patient have the potential to tolerate intense rehabilitation     Barriers to Discharge        Equipment Recommendations       Recommendations for Other Services    Frequency     Plan Frequency remains appropriate;Discharge plan remains appropriate    Precautions / Restrictions     Pertinent Vitals/Pain     Mobility  Ambulation/Gait Ambulation/Gait Assistance: 7: Independent Ambulation Distance (Feet): 300 Feet Assistive device: Straight cane Gait Pattern: Within Functional Limits Stairs: No Wheelchair Mobility Wheelchair Mobility: No    Exercises Total Joint Exercises Ankle Circles/Pumps: AROM;Both;10 reps;Supine Quad Sets: AROM;Both;10 reps;Supine Short Arc Quad: AROM;Right;10 reps;Supine Heel Slides: AAROM;Right;10 reps;Supine Straight Leg Raises: AROM;Right;10 reps;Supine   PT Diagnosis:    PT Problem List:   PT Treatment Interventions:     PT Goals Acute Rehab PT Goals PT Goal: Ambulate - Progress: Met PT Goal: Perform Home Exercise Program - Progress: Progressing toward goal  Visit Information  Last PT Received On: 08/01/12    Subjective Data  Subjective: no c/o   Cognition       Balance     End of Session PT - End of Session Equipment Utilized During Treatment: Gait belt Activity Tolerance: Patient tolerated treatment well Patient left: in chair;with call bell/phone within reach   GP     Myrlene Broker  L 08/01/2012, 9:15 AM

## 2012-08-02 ENCOUNTER — Telehealth: Payer: Self-pay | Admitting: Orthopedic Surgery

## 2012-08-02 LAB — BASIC METABOLIC PANEL
BUN: 7 mg/dL (ref 6–23)
CO2: 30 mEq/L (ref 19–32)
Calcium: 9.1 mg/dL (ref 8.4–10.5)
Chloride: 105 mEq/L (ref 96–112)
Creatinine, Ser: 0.66 mg/dL (ref 0.50–1.10)
GFR calc Af Amer: >90 mL/min (ref >90)
GFR calc non Af Amer: >90 mL/min (ref >90)
Glucose, Bld: 97 mg/dL (ref 70–99)
Potassium: 4.3 mEq/L (ref 3.5–5.1)
Sodium: 142 mEq/L (ref 135–145)

## 2012-08-02 LAB — CBC
HCT: 32.9 % — ABNORMAL LOW (ref 36.0–46.0)
Hemoglobin: 10.2 g/dL — ABNORMAL LOW (ref 12.0–15.0)
MCH: 27.2 pg (ref 26.0–34.0)
MCHC: 31 g/dL (ref 30.0–36.0)
MCV: 87.7 fL (ref 78.0–100.0)
Platelets: 181 10*3/uL (ref 150–400)
RBC: 3.75 MIL/uL — ABNORMAL LOW (ref 3.87–5.11)
RDW: 14.1 % (ref 11.5–15.5)
WBC: 5 10*3/uL (ref 4.0–10.5)

## 2012-08-02 MED ORDER — HEPARIN (PORCINE) LOCK FLUSH 10 UNIT/ML IV SOLN
10.0000 [IU] | INTRAVENOUS | Status: AC | PRN
  Administered 2012-08-02: 10 [IU]
  Filled 2012-08-02: qty 1

## 2012-08-02 MED ORDER — VANCOMYCIN HCL 1000 MG IV SOLR: 1250.0000 mg | INTRAVENOUS | Status: DC

## 2012-08-02 MED ORDER — OXYCODONE-ACETAMINOPHEN 5-325 MG PO TABS: 1.0000 | ORAL_TABLET | ORAL | Status: DC | PRN

## 2012-08-02 NOTE — Progress Notes (Signed)
08/02/12 1424 Late entry for 1400. Patient being discharged home, reviewed discharge instructions with patient, given copy of instructions, medication list, f/u appointment information. Verbalized understanding. [facility] up with [facility] per case management for home IV antibiotics, RN and PT. Okay for discharge per case management. Pt left floor in stable condition via w/c accompanied by nurse tech. Earnstine Regal, RN

## 2012-08-02 NOTE — Plan of Care (Signed)
Problem: Phase II Progression Outcomes Goal: IV changed to normal saline lock Outcome: Completed/Met Date Met:  08/02/12 08/02/12 1258 Patient to keep central line in place for home IV vancomycin per Dr Romeo Apple, home health set up with [facility] per case management. New dressing applied, clean, dry and intact prior to discharge. Discussed central line precautions with patient, and to keep area clean and dry. Verbalized understanding. Must have central line in place, unable to have picc placed according to case management.

## 2012-08-02 NOTE — Discharge Summary (Signed)
Physician Discharge Summary  Patient ID: Diane [name] MRN: [id number] DOB/AGE: 62-Aug-1952 62 y.o.  Admit date: 07/30/2012 Discharge date: 08/02/2012  Admission Diagnoses:  1. Cellulitis, wound, post-operative   2. hypokalemia   Discharge Diagnoses:  1. Cellulitis, wound, post-operative     Active Problems:  Hypokalemia   Discharged Condition: good  Hospital Course: The patient was admitted for postoperative wound cellulitis November 19 initial sedimentation rate was 39 and C-reactive protein was 2.6 white count was 7.4. She was started on IV vancomycin and titrated up to a level of 17.3 trough level.  Current creatinine is 0.66 her white count is 5.0. Also noted to be hypokalemic and was treated with oral potassium supplementation responded well within normal BMET    Component Value Date/Time   NA 142 08/02/2012 0445   K 4.3 08/02/2012 0445   CL 105 08/02/2012 0445   CO2 30 08/02/2012 0445   GLUCOSE 97 08/02/2012 0445   BUN 7 08/02/2012 0445   CREATININE 0.66 08/02/2012 0445   CREATININE 0.88 01/25/2012 1228   CALCIUM 9.1 08/02/2012 0445   GFRNONAA >90 08/02/2012 0445   GFRAA >90 08/02/2012 0445      She was able to tolerate physical therapy    Consults: None  Significant Diagnostic Studies: as noted   Treatments: antibiotics: vancomycin  Discharge Exam: Blood pressure 103/62, pulse 69, temperature 98 F (36.7 C), temperature source Oral, resp. rate 16, height 5\' 3"  (1.6 m), weight 232 lb 12.9 oz (105.6 kg), SpO2 97.00%. General appearance: alert, cooperative, appears stated age and no distress Incision/Wound: she has sanguinous anterior sinus drainage from the proximal aspect of the right knee wound which is treated with wet-to-dry dressing. Surrounding cellulitis has resolved  Disposition: 03-Skilled Nursing Facility  Discharge Orders    Future Appointments: Provider: Department: Dept Phone: Center:   08/14/2012 3:15 PM Vickki Hearing, MD  [facility] [phone] ROSM     Future Orders Please Complete By Expires   Shower chair      Diet - low sodium heart healthy      Increase activity slowly      Driving Restrictions      Comments:   Ok to drive   Discharge wound care:      Comments:   Daily wet to dry dressing   Call MD for:  temperature >100.4      Call MD for:  severe uncontrolled pain      Home Health      Comments:   1. Vancomycin 1250 mg daily x 39 days (depending on labs)  2. Weekly labs on Monday: cbc, sed rate and c reactive proein 3. Vancomycin trough and cr per protocol 4. Central line care per protocol 5. Wound: wet to dry daily 6. Physical therapy right total knee   Questions: Responses:   To provide the following care/treatments RN    PT   Face-to-face encounter      Comments:   I Fuller Canada certify that this patient is under my care and that I, or a nurse practitioner or physician's assistant working with me, had a face-to-face encounter that meets the physician face-to-face encounter requirements with this patient on 08/02/2012. The encounter with the patient was in whole, or in part for the following medical condition(s) which is the primary reason for home health care (List medical condition): total knee replacement  Post operative wound infection   Questions: Responses:   The encounter with the patient was in  whole, or in part, for the following medical condition, which is the primary reason for home health care post op wound infection   I certify that, based on my findings, the following services are medically necessary home health services Nursing    Physical therapy   My clinical findings support the need for the above services Pain interferes with ambulation/mobility   Further, I certify that my clinical findings support that this patient is homebound due to: Open/draining pressure/stasis ulcer   To provide the following care/treatments RN    PT         Medication List     As of 08/02/2012 11:19 AM    STOP taking these medications         HYDROcodone-acetaminophen 10-325 MG per tablet   Commonly known as: NORCO      TAKE these medications         ALPRAZolam 0.5 MG tablet   Commonly known as: XANAX   Take 0.5 mg by mouth 2 (two) times daily. For anxiety      amitriptyline 50 MG tablet   Commonly known as: ELAVIL   Take 1 tablet (50 mg total) by mouth at bedtime.      cloNIDine 0.2 MG tablet   Commonly known as: CATAPRES   Take 0.2 mg by mouth at bedtime.      cyclobenzaprine 10 MG tablet   Commonly known as: FLEXERIL   Take 10 mg by mouth 3 (three) times daily. For spasms      divalproex 250 MG DR tablet   Commonly known as: DEPAKOTE   Take 750 mg by mouth at bedtime.      gabapentin 300 MG capsule   Commonly known as: NEURONTIN   Take 1 capsule (300 mg total) by mouth 3 (three) times daily.      LEVOTHROID 150 MCG tablet   Generic drug: levothyroxine   Take 150 mcg by mouth daily.      loratadine 10 MG tablet   Commonly known as: CLARITIN   Take 10 mg by mouth daily.      multivitamin with minerals Tabs   Take 1 tablet by mouth daily.      oxybutynin 5 MG tablet   Commonly known as: DITROPAN   Take 5 mg by mouth 2 (two) times daily.      oxyCODONE-acetaminophen 5-325 MG per tablet   Commonly known as: PERCOCET/ROXICET   Take 1 tablet by mouth every 4 (four) hours as needed.      rosuvastatin 40 MG tablet   Commonly known as: CRESTOR   Take 40 mg by mouth daily.      sodium chloride 0.9 % SOLN 250 mL with vancomycin 1000 MG SOLR 1,250 mg   Inject 1,250 mg into the vein daily.           Follow-up Information    Follow up with Fuller Canada, MD. On 08/13/2012.   Contact information:   [address], [address] [place] [address] [phone]         [facility] will handle the home health needs  The patient will have weekly laboratory studies done on  Mondays she will followup with me on December 3 and then every Tuesday  She has a wet to dry dressing change daily  IV vancomycin for 39 consecutive days.  Signed: Fuller Canada 08/02/2012, 11:19 AM

## 2012-08-02 NOTE — Progress Notes (Signed)
Subjective: I feel good.  Objective: Vital signs in last 24 hours: Temp:  [97.7 F (36.5 C)-98 F (36.7 C)] 98 F (36.7 C) (11/22 0614) Pulse Rate:  [69-76] 69  (11/22 0614) Resp:  [16] 16  (11/22 0614) BP: (103-156)/(62-84) 103/62 mmHg (11/22 0614) SpO2:  [96 %-97 %] 97 % (11/22 0614)  Intake/Output from previous day: 11/21 0701 - 11/22 0700 In: 200 [IV Piggyback:200] Out: -  Intake/Output this shift: Total I/O In: 10 [I.V.:10] Out: -    Basename 08/02/12 0445 07/30/12 1140  HGB 10.2* 11.5*  good.  Basename 08/02/12 0445 07/30/12 1140  WBC 5.0 7.4  RBC 3.75* 4.21  HCT 32.9* 37.4  PLT 181 174    Basename 08/02/12 0445 07/30/12 1140  NA 142 140  K 4.3 3.1*  CL 105 101  CO2 30 31  BUN 7 5*  CREATININE 0.66 0.75  GLUCOSE 97 109*  CALCIUM 9.1 9.1   No results found for this basename: LABPT:2,INR:2 in the last 72 hours  Incision: Decreased drainage from the proximal incision Has active flexion extension of the nadir at 90 she has good quadriceps power and function Assessment/Plan: She'll be discharged home with home health and 39 days of IV antibiotics with a followup scheduled for December 3rd   Fuller Canada 08/02/2012, 11:04 AM

## 2012-08-05 ENCOUNTER — Telehealth: Payer: Self-pay | Admitting: Orthopedic Surgery

## 2012-08-05 NOTE — Telephone Encounter (Signed)
Patient called from home, states has been discharged to home from hospital Friday, 08/02/12, asking about home assistance with ADL's.   Per [facility] health nurse, Sherrilyn Rist, states occupational therapy side is checking w/her [facility], as to what level of assistance her insurance will help with.  I called back to patient, relayed.  I also emphasized the need to request help from her family and friends to have as additional help with home duties.

## 2012-08-07 ENCOUNTER — Other Ambulatory Visit (HOSPITAL_COMMUNITY): Payer: Self-pay | Admitting: Psychiatry

## 2012-08-12 ENCOUNTER — Telehealth: Payer: Self-pay | Admitting: Orthopedic Surgery

## 2012-08-12 NOTE — Telephone Encounter (Signed)
No she can not   Add some ibuprofen for now 800 mg every 8 hours   7 is ok

## 2012-08-12 NOTE — Telephone Encounter (Signed)
On Friday 08/09/12. Vernona Rieger with [facility] left a message that Diane [name] was complaining of a level 7 pain. She is taking Percocet 5/325 every 4 hours but is still having pain.  She said Kijana is asking if she can take more often than every 4 hours, or either get something stronger. Laura's phone # [phone]

## 2012-08-12 NOTE — Telephone Encounter (Signed)
Advised Diane [name]/[facility] of doctor's reply

## 2012-08-13 ENCOUNTER — Ambulatory Visit (INDEPENDENT_AMBULATORY_CARE_PROVIDER_SITE_OTHER): Payer: Medicare HMO | Admitting: Orthopedic Surgery

## 2012-08-13 ENCOUNTER — Encounter: Payer: Self-pay | Admitting: Orthopedic Surgery

## 2012-08-13 VITALS — Ht 63.0 in | Wt 233.0 lb

## 2012-08-13 DIAGNOSIS — M25569 Pain in unspecified knee: Secondary | ICD-10-CM | POA: Insufficient documentation

## 2012-08-13 DIAGNOSIS — T8149XA Infection following a procedure, other surgical site, initial encounter: Secondary | ICD-10-CM

## 2012-08-13 DIAGNOSIS — Z96659 Presence of unspecified artificial knee joint: Secondary | ICD-10-CM

## 2012-08-13 DIAGNOSIS — T8140XA Infection following a procedure, unspecified, initial encounter: Secondary | ICD-10-CM

## 2012-08-13 MED ORDER — MORPHINE SULFATE ER 15 MG PO TBCR: 15.0000 mg | EXTENDED_RELEASE_TABLET | Freq: Two times a day (BID) | ORAL | Status: DC

## 2012-08-13 NOTE — Progress Notes (Signed)
Patient ID: LEEYA RUSCONI, female   DOB: 1950/10/25, 61 y.o.   MRN: [id number] Chief Complaint  Patient presents with  . Follow-up    Recheck on right knee replacement., Proximal wound breakdown, IV vancomycin   1. Knee pain  morphine (MS CONTIN) 15 MG 12 hr tablet  2. Cellulitis, wound, post-operative    3. S/P total knee replacement      Date of surgery 07/01/2012 right total knee arthroplasty  Complicated. by proximal wound breakdown appear to be suture abscess  The patient had laboratory studies done on November 25 with the following results White count 4.3 Sed rate 30 C. reactive protein 0.3 Vancomycin level trough 10.1 Creatinine 0.62 Vancomycin dose 1250 mg daily  Today her vancomycin level is 8 and her white count I believe was 6 we will recheck that when we get all labs together apparently the lab did not draw values for this week.  Her incision has a 2 cm x 2 cm length width and 15 mm depth granulating wound at the top of the incision  Knee flexion improving to 105 with full extension  Patient uses cane to walk  He seems to be improving recommend continue current vancomycin dosing. If necessary we can go up to 1500 mg based on a creatinine. I would like to see the white count sedimentation rate and C-reactive protein as well  Followup 1 week

## 2012-08-13 NOTE — Patient Instructions (Signed)
Continue dressing changes and vancomycin

## 2012-08-14 ENCOUNTER — Ambulatory Visit: Payer: Self-pay | Admitting: Orthopedic Surgery

## 2012-08-14 NOTE — Telephone Encounter (Signed)
Home care for patient picked up by [facility].

## 2012-08-21 ENCOUNTER — Ambulatory Visit (INDEPENDENT_AMBULATORY_CARE_PROVIDER_SITE_OTHER): Payer: Medicare HMO | Admitting: Orthopedic Surgery

## 2012-08-21 ENCOUNTER — Encounter: Payer: Self-pay | Admitting: Orthopedic Surgery

## 2012-08-21 VITALS — BP 140/72 | Ht 63.0 in | Wt 233.0 lb

## 2012-08-21 DIAGNOSIS — T8149XA Infection following a procedure, other surgical site, initial encounter: Secondary | ICD-10-CM

## 2012-08-21 DIAGNOSIS — T8140XA Infection following a procedure, unspecified, initial encounter: Secondary | ICD-10-CM

## 2012-08-21 DIAGNOSIS — Z96659 Presence of unspecified artificial knee joint: Secondary | ICD-10-CM

## 2012-08-21 NOTE — Progress Notes (Signed)
Patient ID: Diane [name], female   DOB: 04/06/1951, 61 y.o.   MRN: [id number] Chief Complaint  Patient presents with  . Follow-up    8 day follow up right knee wound post TKA with labs    Date of surgery 07/01/2012  Today's most recent laboratory studies from December 9  White count 4.8  Sedimentation rate 22  Vancomycin level XII.9  Dose of vancomycin 1250 mg daily  C. reactive protein 0.6 and  Creatinine 1.09  Lab work looks good  Wound has good granulation bed it is approximately 6 mm deep by 8 mm long and 7 mm wide  Recommend continue vancomycin followup with me in one week with repeat labs

## 2012-08-26 ENCOUNTER — Encounter: Payer: Self-pay | Admitting: Orthopedic Surgery

## 2012-08-28 ENCOUNTER — Other Ambulatory Visit (HOSPITAL_COMMUNITY): Payer: Self-pay | Admitting: Psychiatry

## 2012-08-28 ENCOUNTER — Ambulatory Visit (INDEPENDENT_AMBULATORY_CARE_PROVIDER_SITE_OTHER): Payer: Medicare HMO | Admitting: Orthopedic Surgery

## 2012-08-28 VITALS — BP 120/58 | Ht 63.0 in | Wt 233.0 lb

## 2012-08-28 DIAGNOSIS — T8149XA Infection following a procedure, other surgical site, initial encounter: Secondary | ICD-10-CM

## 2012-08-28 DIAGNOSIS — Z96659 Presence of unspecified artificial knee joint: Secondary | ICD-10-CM

## 2012-08-28 DIAGNOSIS — T8140XA Infection following a procedure, unspecified, initial encounter: Secondary | ICD-10-CM

## 2012-08-28 NOTE — Progress Notes (Signed)
Patient ID: Diane [name], female   DOB: 09-Feb-1951, 61 y.o.   MRN: [id number] Chief Complaint  Patient presents with  . Follow-up    Follow up labs and wound check/postop RIGHT total knee arthroplasty, proximal wound, October 21st    The wound continues to improve with good granulation tissue shrinking in size and depth.  Labs not back yet.  Lab called to get results  Followup in a week with labs from Monday the 23rd

## 2012-08-30 ENCOUNTER — Encounter: Payer: Self-pay | Admitting: Orthopedic Surgery

## 2012-09-05 ENCOUNTER — Encounter: Payer: Self-pay | Admitting: Orthopedic Surgery

## 2012-09-05 ENCOUNTER — Ambulatory Visit (INDEPENDENT_AMBULATORY_CARE_PROVIDER_SITE_OTHER): Payer: Medicare HMO | Admitting: Orthopedic Surgery

## 2012-09-05 VITALS — BP 110/70 | Ht 63.0 in | Wt 233.0 lb

## 2012-09-05 DIAGNOSIS — Z96659 Presence of unspecified artificial knee joint: Secondary | ICD-10-CM

## 2012-09-05 DIAGNOSIS — M25569 Pain in unspecified knee: Secondary | ICD-10-CM

## 2012-09-05 DIAGNOSIS — T8149XA Infection following a procedure, other surgical site, initial encounter: Secondary | ICD-10-CM

## 2012-09-05 DIAGNOSIS — T8140XA Infection following a procedure, unspecified, initial encounter: Secondary | ICD-10-CM

## 2012-09-05 MED ORDER — MORPHINE SULFATE ER 15 MG PO TBCR: 15.0000 mg | EXTENDED_RELEASE_TABLET | Freq: Two times a day (BID) | ORAL | Status: DC

## 2012-09-05 NOTE — Progress Notes (Addendum)
Patient ID: Diane [name], female   DOB: September 25, 1950, 61 y.o.   MRN: [id number] Chief Complaint  Patient presents with  . Follow-up    recheck right TKA and lab review DOS 07/01/12    1. Cellulitis, wound, post-operative  morphine (MS CONTIN) 15 MG 12 hr tablet  2. Knee pain  morphine (MS CONTIN) 15 MG 12 hr tablet  3. S/P total knee replacement  morphine (MS CONTIN) 15 MG 12 hr tablet    Wound clean small  No drainage   Labs are improving CRP .27 Cr 1.29 White blood cell count was 4.4 Sedimentation rate listed is pending  Last Vanc TR was high   Decrease dose of medication to 1250mg  q 24  Return in 1 week   11.22.2013 started Vancomycin  stop day Dec 31st

## 2012-09-06 ENCOUNTER — Encounter: Payer: Self-pay | Admitting: Orthopedic Surgery

## 2012-09-09 ENCOUNTER — Telehealth: Payer: Self-pay | Admitting: Orthopedic Surgery

## 2012-09-09 NOTE — Telephone Encounter (Signed)
Niki/[facility] called and said Diane [name] only has enough antibiotic for today.  According to her last office note, she is to stop 09/10/12. If so, need to call [facility] and order enough for 1 more day. Niki's # [id number]  [facility] ph # [phone]   Fax # [phone]

## 2012-09-09 NOTE — Telephone Encounter (Signed)
Called [facility] and spoke with the pharmacist and he informed me that today was her last dose of antibiotic.  He was going to call her home to make sure they had enough heparin to continue to flush pic line. Dr. Romeo Apple is aware of this conversation.

## 2012-09-12 ENCOUNTER — Ambulatory Visit (INDEPENDENT_AMBULATORY_CARE_PROVIDER_SITE_OTHER): Payer: Medicare HMO | Admitting: Orthopedic Surgery

## 2012-09-12 ENCOUNTER — Encounter: Payer: Self-pay | Admitting: Orthopedic Surgery

## 2012-09-12 VITALS — BP 110/56 | Ht 63.0 in | Wt 233.0 lb

## 2012-09-12 DIAGNOSIS — Z96659 Presence of unspecified artificial knee joint: Secondary | ICD-10-CM

## 2012-09-12 DIAGNOSIS — T8140XA Infection following a procedure, unspecified, initial encounter: Secondary | ICD-10-CM

## 2012-09-12 DIAGNOSIS — T8149XA Infection following a procedure, other surgical site, initial encounter: Secondary | ICD-10-CM

## 2012-09-12 NOTE — Patient Instructions (Signed)
Dressing change daily

## 2012-09-12 NOTE — Progress Notes (Signed)
Patient ID: Diane [name], female   DOB: [birth year], 62 y.o.   MRN: [id number] Chief Complaint  Patient presents with  . Follow-up    recheck right TKA and review lab results DOS 07/01/2012     The patient has had 6 weeks of IV antibiotics for proximal wound breakdown superficial  Labs show that her vancomycin level is creeping up but she is now off vancomycin  Her wound looks clean with excellent granulation bed pink tissue depth is decreasing size is decreasing  Recommend 1 more week but the line and recheck the knee if everything looks good we can remove the line  Dr. Leticia Penna at the line and swallow confer with him regarding removing it

## 2012-09-16 ENCOUNTER — Telehealth: Payer: Self-pay | Admitting: Orthopedic Surgery

## 2012-09-16 NOTE — Telephone Encounter (Signed)
Niki/[facility] asked if you want her to continue drawing labs for Diane [name]?  Niki's # [id number]

## 2012-09-16 NOTE — Telephone Encounter (Signed)
Stop labs draw

## 2012-09-16 NOTE — Telephone Encounter (Signed)
Advised Niki/[facility] of doctor's reply

## 2012-09-18 ENCOUNTER — Encounter: Payer: Self-pay | Admitting: Orthopedic Surgery

## 2012-09-18 ENCOUNTER — Ambulatory Visit (INDEPENDENT_AMBULATORY_CARE_PROVIDER_SITE_OTHER): Payer: Medicare HMO | Admitting: Orthopedic Surgery

## 2012-09-18 VITALS — BP 120/64 | Ht 63.0 in | Wt 233.0 lb

## 2012-09-18 DIAGNOSIS — T8140XA Infection following a procedure, unspecified, initial encounter: Secondary | ICD-10-CM

## 2012-09-18 DIAGNOSIS — Z96659 Presence of unspecified artificial knee joint: Secondary | ICD-10-CM

## 2012-09-18 DIAGNOSIS — T8149XA Infection following a procedure, other surgical site, initial encounter: Secondary | ICD-10-CM

## 2012-09-18 NOTE — Progress Notes (Signed)
Patient ID: Diane [name], female   DOB: 1951-04-11, 62 y.o.   MRN: [id number] Chief Complaint  Patient presents with  . Wound Check    Status post knee replacement complicated by proximal wound breakdown  Knee surgery on 07/01/2012  Although her last lab work showed some slight elevation in her C-reactive protein her creatinine was 1.48. She's off antibiotics. Her wound looks incredibly good is a small 5 mm long by 5 mm wide by 3 mm deep wound at the proximal portion of the incision which is completely red no drainage no odor no surrounding erythema  Dr. Leticia Penna will help [place] get the line out  We will follow her clinically  We will see her every 2 weeks until the wound closes. Continue wet-to-dry dressings

## 2012-09-18 NOTE — Patient Instructions (Signed)
Continue wet-to-dry dressings  Stop all antibiotics

## 2012-09-23 ENCOUNTER — Encounter: Payer: Self-pay | Admitting: Orthopedic Surgery

## 2012-09-26 ENCOUNTER — Encounter: Payer: Self-pay | Admitting: Orthopedic Surgery

## 2012-10-03 ENCOUNTER — Ambulatory Visit (INDEPENDENT_AMBULATORY_CARE_PROVIDER_SITE_OTHER): Payer: Medicare HMO | Admitting: Orthopedic Surgery

## 2012-10-03 VITALS — BP 118/76 | Ht 63.0 in | Wt 233.0 lb

## 2012-10-03 DIAGNOSIS — T8131XA Disruption of external operation (surgical) wound, not elsewhere classified, initial encounter: Secondary | ICD-10-CM

## 2012-10-03 DIAGNOSIS — Z96659 Presence of unspecified artificial knee joint: Secondary | ICD-10-CM

## 2012-10-03 MED ORDER — OXYCODONE-ACETAMINOPHEN 5-325 MG PO TABS: 1.0000 | ORAL_TABLET | ORAL | Status: DC | PRN

## 2012-10-03 NOTE — Patient Instructions (Addendum)
Cover with Bandaid

## 2012-10-03 NOTE — Progress Notes (Signed)
Patient ID: Diane [name], female   DOB: [birth year], 62 y.o.   MRN: [id number] Chief Complaint  Patient presents with  . Follow-up    2 week recheck on right TKA wound. DOS 07-01-12.    Her wound has improved significantly  To a small pinpoint area   She c/o pain in the ITB   Also numbness in this area   ROM =110 degrees  She seems to be tender over the iliotibial band. Has excellent range of motion her wound looks good her extension power is good her extension is good  Recommend topical creams and rubs and ice tendinitis  Return 1 month continue Percocet #180

## 2012-10-08 ENCOUNTER — Encounter: Payer: Self-pay | Admitting: Orthopedic Surgery

## 2012-10-25 ENCOUNTER — Ambulatory Visit (HOSPITAL_COMMUNITY): Payer: Self-pay | Admitting: Psychiatry

## 2012-11-04 ENCOUNTER — Ambulatory Visit (INDEPENDENT_AMBULATORY_CARE_PROVIDER_SITE_OTHER): Payer: Medicare HMO

## 2012-11-04 ENCOUNTER — Ambulatory Visit (INDEPENDENT_AMBULATORY_CARE_PROVIDER_SITE_OTHER): Payer: Medicare HMO | Admitting: Orthopedic Surgery

## 2012-11-04 VITALS — BP 160/82 | Ht 63.0 in | Wt 233.0 lb

## 2012-11-04 DIAGNOSIS — Z96651 Presence of right artificial knee joint: Secondary | ICD-10-CM

## 2012-11-04 DIAGNOSIS — M25569 Pain in unspecified knee: Secondary | ICD-10-CM

## 2012-11-04 DIAGNOSIS — M25561 Pain in right knee: Secondary | ICD-10-CM

## 2012-11-04 DIAGNOSIS — L0390 Cellulitis, unspecified: Secondary | ICD-10-CM | POA: Insufficient documentation

## 2012-11-04 DIAGNOSIS — Z5189 Encounter for other specified aftercare: Secondary | ICD-10-CM

## 2012-11-04 DIAGNOSIS — T8131XA Disruption of external operation (surgical) wound, not elsewhere classified, initial encounter: Secondary | ICD-10-CM | POA: Insufficient documentation

## 2012-11-04 DIAGNOSIS — Z96659 Presence of unspecified artificial knee joint: Secondary | ICD-10-CM

## 2012-11-04 DIAGNOSIS — T8131XD Disruption of external operation (surgical) wound, not elsewhere classified, subsequent encounter: Secondary | ICD-10-CM

## 2012-11-04 DIAGNOSIS — L0291 Cutaneous abscess, unspecified: Secondary | ICD-10-CM

## 2012-11-04 MED ORDER — OXYCODONE-ACETAMINOPHEN 5-325 MG PO TABS: 1.0000 | ORAL_TABLET | ORAL | Status: DC | PRN

## 2012-11-04 MED ORDER — MORPHINE SULFATE ER 15 MG PO TBCR: 15.0000 mg | EXTENDED_RELEASE_TABLET | Freq: Two times a day (BID) | ORAL | Status: DC

## 2012-11-04 MED ORDER — PREDNISONE 10 MG PO TABS: 10.0000 mg | ORAL_TABLET | Freq: Three times a day (TID) | ORAL | Status: DC

## 2012-11-04 NOTE — Progress Notes (Signed)
Patient ID: VINNIE BOBST, female   DOB: July 18, 1951, 62 y.o.   MRN: [id number] Chief Complaint  Patient presents with  . Follow-up    2 week recheck wound on right total knee replacement. DOS 07-01-12.    BP 160/82  Ht 5\' 3"  (1.6 m)  Wt 233 lb (105.688 kg)  BMI 41.28 kg/m2  S/P total knee replacement, right - Plan: predniSONE (DELTASONE) 10 MG tablet, oxyCODONE-acetaminophen (PERCOCET/ROXICET) 5-325 MG per tablet, morphine (MS CONTIN) 15 MG 12 hr tablet  Knee pain, right - Plan: DG Knee AP/LAT W/Sunrise Right, CBC w/Diff, Sed Rate (ESR), C-reactive protein  Cellulitis - There is no surrounding cellulitis at this time the wound is closed however this is a rule out diagnosis. - Plan: CBC w/Diff, Sed Rate (ESR), C-reactive protein  Postoperative wound breakdown, subsequent encounter - Plan: predniSONE (DELTASONE) 10 MG tablet, oxyCODONE-acetaminophen (PERCOCET/ROXICET) 5-325 MG per tablet, morphine (MS CONTIN) 15 MG 12 hr tablet   Mrs. Welliver comes in today complaining of pain in her right knee and right leg she is ambulating with a cane and having quite a bit of difficulty. She status post knee replacement as stated, followed by completion proximal wound breakdown treatment IV vancomycin for 6 weeks did well.  She is not really complaining that. She complains of medial lateral patella for femoral pain especially in the lateral side, which is worsened with ambulation relieved by rest.   Review of systems denies fever or chills she does say that her back hurts and her leg also hurts. She has initial lumbar disc disease multiple MRIs which shows that she has recurrent left lateral disc herniation and had a history of T11-T12 disc problem  Vital signs as needed she is awake alert and oriented x3 mood and affect are normal. She's a mature with a cane in her right hand and a significant limp  On inspection the knee looks good wound is healed there are no signs of erythema swelling but she is tender  in the medial lateral side of the patella lateral greater than medial knee is stable she has excellent strength full extension 105 of flexion no peripheral edema  X-ray was repeated today  X-ray right knee  Status post total knee replacement with continued pain  3 views of the knee show no signs of loosening no sign of infection the patella is well centered. There may be medial and lateral spurs which articulating with the femoral component which may be causing some of her problem  Recommend repeat infectious workup with CBC, sedimentation rate and C-reactive protein followup in 2 weeks  She is placed on long acting morphine and Percocet 5 mg. She's also placed on a steroid Dosepak in case this is inflammatory or related to her lumbar disc disease

## 2012-11-04 NOTE — Patient Instructions (Signed)
Get the lab tests at the building across the street form the hospital

## 2012-11-08 LAB — SEDIMENTATION RATE: Sed Rate: 4 mm/hr (ref 0–22)

## 2012-11-08 LAB — CBC WITH DIFFERENTIAL/PLATELET
Basophils Absolute: 0 10*3/uL (ref 0.0–0.1)
Basophils Relative: 0 % (ref 0–1)
Eosinophils Absolute: 0 10*3/uL (ref 0.0–0.7)
Eosinophils Relative: 0 % (ref 0–5)
HCT: 33.5 % — ABNORMAL LOW (ref 36.0–46.0)
Hemoglobin: 10.6 g/dL — ABNORMAL LOW (ref 12.0–15.0)
Lymphocytes Relative: 21 % (ref 12–46)
Lymphs Abs: 1.8 10*3/uL (ref 0.7–4.0)
MCH: 24.5 pg — ABNORMAL LOW (ref 26.0–34.0)
MCHC: 31.6 g/dL (ref 30.0–36.0)
MCV: 77.4 fL — ABNORMAL LOW (ref 78.0–100.0)
Monocytes Absolute: 0.5 10*3/uL (ref 0.1–1.0)
Monocytes Relative: 6 % (ref 3–12)
Neutro Abs: 6.3 10*3/uL (ref 1.7–7.7)
Neutrophils Relative %: 73 % (ref 43–77)
Platelets: 209 10*3/uL (ref 150–400)
RBC: 4.33 MIL/uL (ref 3.87–5.11)
RDW: 17.5 % — ABNORMAL HIGH (ref 11.5–15.5)
WBC: 8.6 10*3/uL (ref 4.0–10.5)

## 2012-11-18 ENCOUNTER — Ambulatory Visit (INDEPENDENT_AMBULATORY_CARE_PROVIDER_SITE_OTHER): Payer: Medicare HMO | Admitting: Orthopedic Surgery

## 2012-11-18 VITALS — BP 140/70 | Ht 63.0 in | Wt 201.0 lb

## 2012-11-18 DIAGNOSIS — Z96659 Presence of unspecified artificial knee joint: Secondary | ICD-10-CM

## 2012-11-18 DIAGNOSIS — Z5189 Encounter for other specified aftercare: Secondary | ICD-10-CM

## 2012-11-18 DIAGNOSIS — Z96651 Presence of right artificial knee joint: Secondary | ICD-10-CM

## 2012-11-18 DIAGNOSIS — T8131XD Disruption of external operation (surgical) wound, not elsewhere classified, subsequent encounter: Secondary | ICD-10-CM

## 2012-11-18 MED ORDER — MORPHINE SULFATE ER 15 MG PO TBCR: 15.0000 mg | EXTENDED_RELEASE_TABLET | Freq: Two times a day (BID) | ORAL | Status: DC

## 2012-11-18 MED ORDER — OXYCODONE-ACETAMINOPHEN 5-325 MG PO TABS: 1.0000 | ORAL_TABLET | ORAL | Status: DC | PRN

## 2012-11-18 NOTE — Patient Instructions (Addendum)
Surgery will be scheduled for next month

## 2012-11-18 NOTE — Progress Notes (Signed)
Patient ID: Diane [name], female   DOB: [birth year], 62 y.o.   MRN: [id number] Chief Complaint  Patient presents with  . Follow-up    wound check post TKA DOS 07/01/12    BP 140/70  Ht 5\' 3"  (1.6 m)  Wt 201 lb (91.173 kg)  BMI 35.61 kg/m2  [name] comes back with a normal white count and a normal erythrocyte sedimentation rate  Still having lateral patellofemoral pain. The previously noted open wound has closed and clinically and biologically she does not have infection  Review of systems she does not complaining of hip pain groin pain radiation of pain to her thigh hip or lower leg her pain is isolated around the lateral patellofemoral joint somewhat superiorly as well.  Exam General appearance is normal, the patient is alert and oriented x3 with normal mood and affect.  BP 140/70  Ht 5\' 3"  (1.6 m)  Wt 201 lb (91.173 kg)  BMI 35.61 kg/m2  The previously noted open wound is closed no tenderness there no surrounding erythema no cellulitis. Knee flexion 110 knee extension full with pain pain is lateral tenderness is lateral and is in the peripatellar retinacular area Quad strength is normal knee is stable  Patellofemoral pain postop total knee four months.  We discussed further the possibility of revising her patella with excision of performed osteophytes and lateral release plus or minus removal of medial osteophytes  She's agreeable to this  We'll set this up for one month from now  Basically on x-ray she has some peripheral bone that is probably causing the pain.

## 2012-11-28 ENCOUNTER — Ambulatory Visit (INDEPENDENT_AMBULATORY_CARE_PROVIDER_SITE_OTHER): Payer: Medicare HMO | Admitting: Psychiatry

## 2012-11-28 ENCOUNTER — Encounter (HOSPITAL_COMMUNITY): Payer: Self-pay | Admitting: Psychiatry

## 2012-11-28 VITALS — Wt 204.6 lb

## 2012-11-28 DIAGNOSIS — F1010 Alcohol abuse, uncomplicated: Secondary | ICD-10-CM

## 2012-11-28 DIAGNOSIS — F5105 Insomnia due to other mental disorder: Secondary | ICD-10-CM | POA: Insufficient documentation

## 2012-11-28 DIAGNOSIS — F39 Unspecified mood [affective] disorder: Secondary | ICD-10-CM

## 2012-11-28 DIAGNOSIS — F319 Bipolar disorder, unspecified: Secondary | ICD-10-CM

## 2012-11-28 DIAGNOSIS — F3130 Bipolar disorder, current episode depressed, mild or moderate severity, unspecified: Secondary | ICD-10-CM

## 2012-11-28 DIAGNOSIS — F1310 Sedative, hypnotic or anxiolytic abuse, uncomplicated: Secondary | ICD-10-CM

## 2012-11-28 DIAGNOSIS — F411 Generalized anxiety disorder: Secondary | ICD-10-CM

## 2012-11-28 DIAGNOSIS — G894 Chronic pain syndrome: Secondary | ICD-10-CM | POA: Insufficient documentation

## 2012-11-28 MED ORDER — DIVALPROEX SODIUM 250 MG PO DR TAB: 750.0000 mg | DELAYED_RELEASE_TABLET | Freq: Every day | ORAL | Status: DC

## 2012-11-28 MED ORDER — AMITRIPTYLINE HCL 50 MG PO TABS: 50.0000 mg | ORAL_TABLET | Freq: Every day | ORAL | Status: DC

## 2012-11-28 MED ORDER — GABAPENTIN 300 MG PO CAPS: 300.0000 mg | ORAL_CAPSULE | Freq: Four times a day (QID) | ORAL | Status: DC

## 2012-11-28 NOTE — Patient Instructions (Addendum)
Could use "Move Free" or "Osteo bi Flex" for arthritic pain.   The important ingredients are Chondrotin Sulfate and Glucosamine.  Tumeric is also helpful for arthritis.   Krill oil and cod liver oil may be helpful for arthritis.   [employer] is a great source for all of these.  [phone]  Glori Luis is a mushroom that has the strongest antiinflammatory properties of any substance known to mankind.  Among other sources, it can be ordered from [facility].com  Strongly consider attending at least 6 Alanon Meetings to help you learn about how your helping others to the exclusion of helping yourself is actually hurting yourself and is actually an addiction to fixing others and that you need to work the 12 Step to Happiness through the [employer]. Al-Anon Family Groups could be helpful with how to deal with substance abusing family and friends. Or your own issues of being in victim role.  There are only 40 Alanon Family Group meetings a week here in [place].  Online are current listing of those meetings @ [email or web address]  There are [employer].  Search on line and there you can learn the format and can access the schedule for yourself.  Their number is [phone]  GET SERIOUS about taking care of yourself.  Do the next right thing and that often means doing something to care for yourself along the lines of are you hungry, are you angry, are you lonely, are you tired, are you scared?  Relaxation is the ultimate solution for you.  You can seek it through tub baths, bubble baths, essential oils or incense, walking or chatting with friends, listening to soft music, watching a candle burn and just letting all thoughts go and appreciating the true essence of the Creator.   Get back into counseling with Peggy.  Call if problems or concerns.

## 2012-11-28 NOTE — Progress Notes (Signed)
[facility] [address] Progress Note Diane [name] MRN: [id number] DOB: 12-06-1950 Age: 62 y.o.  Date: 11/28/2012 Start Time: 11:10 AM End Time: 12:15 PM  Chief Complaint: Chief Complaint  Patient presents with  . Depression  . Follow-up  . Medication Refill   Subjective: "I've been away from treatment because of surgery". Depression 7/10 mainly ranging between 6 and 8 and Anxiety 6/10 ranging between 6 and 8 where 1 is the best and 10 is the worst.  Pain is 10/10 mainly her knee ranging from 8 to 9.  History of present illness Patient is 62 year old [place] American female who came for her followup appointment.   Pt reports that she is not compliant with the psychotropic medications and has the above struggles.  She had been struggling with drinking when she goes out.  Her last drink was Sunday when she drank 3 beers and 2 shots of liquor.  She tends to not go out during the week.  She had been drinking both days before that.    Prior effective psychiatric medication Depakote 750 mg at bedtime Amitriptyline 50 mg at bedtime Neurontin 100 mg 3 times a day She is getting Xanax from her primary care physician.  Past psychiatric history Patient has history of one psychiatric admission in [facility]. She took overdose on her pills she was also intoxicated with alcohol. Patient has also seen by psychiatrist in 2004. At that time she was getting more great amount of benzodiazepine pain medication.  Family history Patient endorsed significant history of alcoholism in the family.  Medical history Patient has history of hypertension, allergic rhinitis, hypothyroidism, chronic back pain, hyperlipidemia and obesity.  She is waiting for her right knee surgery.  Psychosocial history Patient has been married for 16 years. She has 2 children. She is married however her relationship is very tense. She has history of leaving her husband in the past.  Currently  she lives by herself.  Mental status examination Patient is mildly obese female who is casually dressed and fairly groomed.  She has difficulty walking due to pain.  She is cooperative and maintained fair eye contact. Her speech is clear and coherent. Her attention and concentration is fair. She denies any active or passive suicidal thinking and homicidal thinking. She's alert and oriented x3. There are no psychotic symptoms present at this time. She denies any auditory or visual hallucination. Her insight judgment is fair and her impulse control is okay.  Lab Results:  Results for orders placed in visit on 11/04/12 (from the past 8736 hour(s))  CBC WITH DIFFERENTIAL   Collection Time    11/07/12  3:20 PM      Result Value Range   WBC 8.6  4.0 - 10.5 K/uL   RBC 4.33  3.87 - 5.11 MIL/uL   Hemoglobin 10.6 (*) 12.0 - 15.0 g/dL   HCT 19.1 (*) 47.8 - 29.5 %   MCV 77.4 (*) 78.0 - 100.0 fL   MCH 24.5 (*) 26.0 - 34.0 pg   MCHC 31.6  30.0 - 36.0 g/dL   RDW 62.1 (*) 30.8 - 65.7 %   Platelets 209  150 - 400 K/uL   Neutrophils Relative 73  43 - 77 %   Neutro Abs 6.3  1.7 - 7.7 K/uL   Lymphocytes Relative 21  12 - 46 %   Lymphs Abs 1.8  0.7 - 4.0 K/uL   Monocytes Relative 6  3 - 12 %   Monocytes Absolute  0.5  0.1 - 1.0 K/uL   Eosinophils Relative 0  0 - 5 %   Eosinophils Absolute 0.0  0.0 - 0.7 K/uL   Basophils Relative 0  0 - 1 %   Basophils Absolute 0.0  0.0 - 0.1 K/uL   Smear Review Criteria for review not met    SEDIMENTATION RATE   Collection Time    11/07/12  3:20 PM      Result Value Range   Sed Rate 4  0 - 22 mm/hr  Results for orders placed during the hospital encounter of 07/30/12 (from the past 8736 hour(s))  BASIC METABOLIC PANEL   Collection Time    07/30/12 11:40 AM      Result Value Range   Sodium 140  135 - 145 mEq/L   Potassium 3.1 (*) 3.5 - 5.1 mEq/L   Chloride 101  96 - 112 mEq/L   CO2 31  19 - 32 mEq/L   Glucose, Bld 109 (*) 70 - 99 mg/dL   BUN 5 (*) 6 - 23 mg/dL    Creatinine, Ser 8.65  0.50 - 1.10 mg/dL   Calcium 9.1  8.4 - 78.4 mg/dL   GFR calc non Af Amer 89 (*) >90 mL/min   GFR calc Af Amer >90  >90 mL/min  CBC WITH DIFFERENTIAL   Collection Time    07/30/12 11:40 AM      Result Value Range   WBC 7.4  4.0 - 10.5 K/uL   RBC 4.21  3.87 - 5.11 MIL/uL   Hemoglobin 11.5 (*) 12.0 - 15.0 g/dL   HCT 69.6  29.5 - 28.4 %   MCV 88.8  78.0 - 100.0 fL   MCH 27.3  26.0 - 34.0 pg   MCHC 30.7  30.0 - 36.0 g/dL   RDW 13.2  44.0 - 10.2 %   Platelets 174  150 - 400 K/uL   Neutrophils Relative 63  43 - 77 %   Neutro Abs 4.6  1.7 - 7.7 K/uL   Lymphocytes Relative 26  12 - 46 %   Lymphs Abs 1.9  0.7 - 4.0 K/uL   Monocytes Relative 9  3 - 12 %   Monocytes Absolute 0.7  0.1 - 1.0 K/uL   Eosinophils Relative 2  0 - 5 %   Eosinophils Absolute 0.1  0.0 - 0.7 K/uL   Basophils Relative 0  0 - 1 %   Basophils Absolute 0.0  0.0 - 0.1 K/uL  C-REACTIVE PROTEIN   Collection Time    07/30/12 11:40 AM      Result Value Range   CRP 2.6 (*) <0.60 mg/dL  SEDIMENTATION RATE   Collection Time    07/30/12 11:40 AM      Result Value Range   Sed Rate 39 (*) 0 - 22 mm/hr  VANCOMYCIN, TROUGH   Collection Time    07/31/12  4:50 AM      Result Value Range   Vancomycin Tr 28.9 (*) 10.0 - 20.0 ug/mL  MRSA PCR SCREENING   Collection Time    07/31/12 11:57 AM      Result Value Range   MRSA by PCR NEGATIVE  NEGATIVE  VANCOMYCIN, TROUGH   Collection Time    08/01/12  8:38 AM      Result Value Range   Vancomycin Tr 17.3  10.0 - 20.0 ug/mL  BASIC METABOLIC PANEL   Collection Time    08/02/12  4:45 AM      Result  Value Range   Sodium 142  135 - 145 mEq/L   Potassium 4.3  3.5 - 5.1 mEq/L   Chloride 105  96 - 112 mEq/L   CO2 30  19 - 32 mEq/L   Glucose, Bld 97  70 - 99 mg/dL   BUN 7  6 - 23 mg/dL   Creatinine, Ser 6.57  0.50 - 1.10 mg/dL   Calcium 9.1  8.4 - 84.6 mg/dL   GFR calc non Af Amer >90  >90 mL/min   GFR calc Af Amer >90  >90 mL/min  CBC   Collection  Time    08/02/12  4:45 AM      Result Value Range   WBC 5.0  4.0 - 10.5 K/uL   RBC 3.75 (*) 3.87 - 5.11 MIL/uL   Hemoglobin 10.2 (*) 12.0 - 15.0 g/dL   HCT 96.2 (*) 95.2 - 84.1 %   MCV 87.7  78.0 - 100.0 fL   MCH 27.2  26.0 - 34.0 pg   MCHC 31.0  30.0 - 36.0 g/dL   RDW 32.4  40.1 - 02.7 %   Platelets 181  150 - 400 K/uL  Results for orders placed during the hospital encounter of 07/01/12 (from the past 8736 hour(s))  CBC   Collection Time    07/02/12  5:31 AM      Result Value Range   WBC 6.1  4.0 - 10.5 K/uL   RBC 3.55 (*) 3.87 - 5.11 MIL/uL   Hemoglobin 10.1 (*) 12.0 - 15.0 g/dL   HCT 25.3 (*) 66.4 - 40.3 %   MCV 91.0  78.0 - 100.0 fL   MCH 28.5  26.0 - 34.0 pg   MCHC 31.3  30.0 - 36.0 g/dL   RDW 47.4  25.9 - 56.3 %   Platelets 139 (*) 150 - 400 K/uL  BASIC METABOLIC PANEL   Collection Time    07/02/12  5:31 AM      Result Value Range   Sodium 141  135 - 145 mEq/L   Potassium 4.4  3.5 - 5.1 mEq/L   Chloride 106  96 - 112 mEq/L   CO2 31  19 - 32 mEq/L   Glucose, Bld 103 (*) 70 - 99 mg/dL   BUN 8  6 - 23 mg/dL   Creatinine, Ser 8.75  0.50 - 1.10 mg/dL   Calcium 7.6 (*) 8.4 - 10.5 mg/dL   GFR calc non Af Amer 89 (*) >90 mL/min   GFR calc Af Amer >90  >90 mL/min  CBC   Collection Time    07/03/12  4:52 AM      Result Value Range   WBC 6.0  4.0 - 10.5 K/uL   RBC 3.71 (*) 3.87 - 5.11 MIL/uL   Hemoglobin 10.4 (*) 12.0 - 15.0 g/dL   HCT 64.3 (*) 32.9 - 51.8 %   MCV 89.2  78.0 - 100.0 fL   MCH 28.0  26.0 - 34.0 pg   MCHC 31.4  30.0 - 36.0 g/dL   RDW 84.1  66.0 - 63.0 %   Platelets 135 (*) 150 - 400 K/uL  CBC   Collection Time    07/04/12  3:58 AM      Result Value Range   WBC 6.9  4.0 - 10.5 K/uL   RBC 3.51 (*) 3.87 - 5.11 MIL/uL   Hemoglobin 9.9 (*) 12.0 - 15.0 g/dL   HCT 16.0 (*) 10.9 - 32.3 %   MCV 89.7  78.0 - 100.0 fL   MCH 28.2  26.0 - 34.0 pg   MCHC 31.4  30.0 - 36.0 g/dL   RDW 16.1  09.6 - 04.5 %   Platelets 138 (*) 150 - 400 K/uL  Results for orders  placed during the hospital encounter of 06/25/12 (from the past 8736 hour(s))  SURGICAL PCR SCREEN   Collection Time    06/25/12 11:40 AM      Result Value Range   MRSA, PCR POSITIVE (*) NEGATIVE   Staphylococcus aureus POSITIVE (*) NEGATIVE  APTT   Collection Time    06/25/12 11:40 AM      Result Value Range   aPTT 34  24 - 37 seconds  BASIC METABOLIC PANEL   Collection Time    06/25/12 11:40 AM      Result Value Range   Sodium 142  135 - 145 mEq/L   Potassium 3.8  3.5 - 5.1 mEq/L   Chloride 104  96 - 112 mEq/L   CO2 30  19 - 32 mEq/L   Glucose, Bld 87  70 - 99 mg/dL   BUN 13  6 - 23 mg/dL   Creatinine, Ser 4.09  0.50 - 1.10 mg/dL   Calcium 9.0  8.4 - 81.1 mg/dL   GFR calc non Af Amer 76 (*) >90 mL/min   GFR calc Af Amer 88 (*) >90 mL/min  CBC WITH DIFFERENTIAL   Collection Time    06/25/12 11:40 AM      Result Value Range   WBC 5.0  4.0 - 10.5 K/uL   RBC 4.21  3.87 - 5.11 MIL/uL   Hemoglobin 11.9 (*) 12.0 - 15.0 g/dL   HCT 91.4  78.2 - 95.6 %   MCV 86.5  78.0 - 100.0 fL   MCH 28.3  26.0 - 34.0 pg   MCHC 32.7  30.0 - 36.0 g/dL   RDW 21.3  08.6 - 57.8 %   Platelets 185  150 - 400 K/uL   Neutrophils Relative 31 (*) 43 - 77 %   Neutro Abs 1.6 (*) 1.7 - 7.7 K/uL   Lymphocytes Relative 57 (*) 12 - 46 %   Lymphs Abs 2.9  0.7 - 4.0 K/uL   Monocytes Relative 8  3 - 12 %   Monocytes Absolute 0.4  0.1 - 1.0 K/uL   Eosinophils Relative 4  0 - 5 %   Eosinophils Absolute 0.2  0.0 - 0.7 K/uL   Basophils Relative 1  0 - 1 %   Basophils Absolute 0.1  0.0 - 0.1 K/uL  PROTIME-INR   Collection Time    06/25/12 11:40 AM      Result Value Range   Prothrombin Time 13.3  11.6 - 15.2 seconds   INR 1.02  0.00 - 1.49  PREPARE RBC (CROSSMATCH)   Collection Time    06/25/12 11:40 AM      Result Value Range   Order Confirmation ORDER PROCESSED BY BLOOD BANK    TYPE AND SCREEN   Collection Time    06/25/12 11:40 AM      Result Value Range   ABO/RH(D) O POS     Antibody Screen NEG      Sample Expiration 07/09/2012     Unit Number [id number]     Blood Component Type RED CELLS,LR     Unit division 00     Status of Unit REL FROM [facility]     Transfusion Status OK TO TRANSFUSE  Crossmatch Result Compatible     Unit Number [id number]     Blood Component Type RED CELLS,LR     Unit division 00     Status of Unit REL FROM [facility]     Transfusion Status OK TO TRANSFUSE     Crossmatch Result Compatible    ABO/RH   Collection Time    06/25/12 11:40 AM      Result Value Range   ABO/RH(D) O POS    Results for orders placed during the hospital encounter of 01/10/12 (from the past 8736 hour(s))  SURGICAL PCR SCREEN   Collection Time    01/10/12 11:35 AM      Result Value Range   MRSA, PCR POSITIVE (*) NEGATIVE   Staphylococcus aureus POSITIVE (*) NEGATIVE  BASIC METABOLIC PANEL   Collection Time    01/10/12 12:05 PM      Result Value Range   Sodium 140  135 - 145 mEq/L   Potassium 3.7  3.5 - 5.1 mEq/L   Chloride 100  96 - 112 mEq/L   CO2 30  19 - 32 mEq/L   Glucose, Bld 104 (*) 70 - 99 mg/dL   BUN 14  6 - 23 mg/dL   Creatinine, Ser 8.46  0.50 - 1.10 mg/dL   Calcium 9.0  8.4 - 96.2 mg/dL   GFR calc non Af Amer >90  >90 mL/min   GFR calc Af Amer >90  >90 mL/min  HEMOGLOBIN AND HEMATOCRIT, BLOOD   Collection Time    01/10/12 12:05 PM      Result Value Range   Hemoglobin 12.9  12.0 - 15.0 g/dL   HCT 95.2  84.1 - 32.4 %  Results for orders placed in visit on 11/23/11 (from the past 8736 hour(s))  CBC WITH DIFFERENTIAL   Collection Time    01/25/12 12:28 PM      Result Value Range   WBC 5.6  4.0 - 10.5 K/uL   RBC 4.46  3.87 - 5.11 MIL/uL   Hemoglobin 12.0  12.0 - 15.0 g/dL   HCT 40.1  02.7 - 25.3 %   MCV 89.2  78.0 - 100.0 fL   MCH 26.9  26.0 - 34.0 pg   MCHC 30.2  30.0 - 36.0 g/dL   RDW 66.4  40.3 - 47.4 %   Platelets 212  150 - 400 K/uL   Neutrophils Relative 37 (*) 43 - 77 %   Neutro Abs 2.1  1.7 - 7.7 K/uL   Lymphocytes Relative 51 (*) 12 - 46 %    Lymphs Abs 2.9  0.7 - 4.0 K/uL   Monocytes Relative 9  3 - 12 %   Monocytes Absolute 0.5  0.1 - 1.0 K/uL   Eosinophils Relative 2  0 - 5 %   Eosinophils Absolute 0.1  0.0 - 0.7 K/uL   Basophils Relative 0  0 - 1 %   Basophils Absolute 0.0  0.0 - 0.1 K/uL   Smear Review Criteria for review not met    HEMOGLOBIN A1C   Collection Time    01/25/12 12:28 PM      Result Value Range   Hemoglobin A1C 5.8 (*) <5.7 %   Mean Plasma Glucose 120 (*) <117 mg/dL  COMPREHENSIVE METABOLIC PANEL   Collection Time    01/25/12 12:28 PM      Result Value Range   Sodium 145  135 - 145 mEq/L   Potassium 4.0  3.5 - 5.3 mEq/L  Chloride 107  96 - 112 mEq/L   CO2 30  19 - 32 mEq/L   Glucose, Bld 97  70 - 99 mg/dL   BUN 15  6 - 23 mg/dL   Creat 4.09  8.11 - 9.14 mg/dL   Total Bilirubin 0.3  0.3 - 1.2 mg/dL   Alkaline Phosphatase 55  39 - 117 U/L   AST 15  0 - 37 U/L   ALT 14  0 - 35 U/L   Total Protein 6.7  6.0 - 8.3 g/dL   Albumin 3.7  3.5 - 5.2 g/dL   Calcium 8.8  8.4 - 78.2 mg/dL  VALPROIC ACID LEVEL   Collection Time    01/25/12 12:28 PM      Result Value Range   Valproic Acid Lvl 92.4  50.0 - 100.0 ug/mL  PCP draws routine labs and nothing is emerging as of concern.  Assessment Axis I bipolar disorder NOS, benzodiazepine abuse, alcohol abuse Axis II deferred Axis III see medical history Axis IV moderate Axis V 60-65  Plan/Discussion: I took her vitals.  I reviewed CC, tobacco/med/surg Hx, meds effects/ side effects, problem list, therapies and responses as well as current situation/symptoms discussed options. Restart prior effective medications. Encourage her to attend [employer] and to cut back on drinking while taking better care of herself.  See orders and pt instructions for more details.  Medical Decision Making Problem Points:  Established problem, worsening (2), New problem, with no additional work-up planned (3), Review of last therapy session (1) and Review of psycho-social  stressors (1) Data Points:  Review or order clinical lab tests (1) Review of medication regiment & side effects (2) Review of new medications or change in dosage (2)  I certify that outpatient services furnished can reasonably be expected to improve the patient's condition.   Orson Aloe, MD, [facility]

## 2012-12-17 ENCOUNTER — Other Ambulatory Visit (HOSPITAL_COMMUNITY): Payer: Self-pay | Admitting: Family Medicine

## 2012-12-17 DIAGNOSIS — Z139 Encounter for screening, unspecified: Secondary | ICD-10-CM

## 2012-12-20 ENCOUNTER — Ambulatory Visit (HOSPITAL_COMMUNITY)
Admission: RE | Admit: 2012-12-20 | Discharge: 2012-12-20 | Disposition: A | Discharge status: Home / Self Care | Payer: Medicare HMO | Source: Ambulatory Visit | Attending: Family Medicine | Admitting: Family Medicine

## 2012-12-20 ENCOUNTER — Telehealth: Payer: Self-pay | Admitting: Orthopedic Surgery

## 2012-12-20 DIAGNOSIS — Z139 Encounter for screening, unspecified: Secondary | ICD-10-CM

## 2012-12-20 DIAGNOSIS — Z1231 Encounter for screening mammogram for malignant neoplasm of breast: Secondary | ICD-10-CM | POA: Insufficient documentation

## 2012-12-20 NOTE — Telephone Encounter (Signed)
Contacted insurer, [employer], ph# [phone], regarding surgery scheduled 01/03/13 at [facility], CPT codes 09811, [phone], ICD9 codes V43.65, V58.89;  per representative Charm Q, no pre-authorization is required for these codes.   Reference # [id number], and her name, today's date, 12/20/12, 1:09p.m.

## 2012-12-23 ENCOUNTER — Encounter (HOSPITAL_COMMUNITY): Payer: Self-pay | Admitting: Pharmacy Technician

## 2012-12-30 ENCOUNTER — Encounter (HOSPITAL_COMMUNITY): Payer: Self-pay

## 2012-12-30 ENCOUNTER — Other Ambulatory Visit: Payer: Self-pay

## 2012-12-30 ENCOUNTER — Encounter (HOSPITAL_COMMUNITY)
Admission: RE | Admit: 2012-12-30 | Discharge: 2012-12-30 | Disposition: A | Discharge status: Home / Self Care | Payer: Medicare HMO | Source: Ambulatory Visit | Attending: Orthopedic Surgery | Admitting: Orthopedic Surgery

## 2012-12-30 ENCOUNTER — Other Ambulatory Visit: Payer: Self-pay | Admitting: Orthopedic Surgery

## 2012-12-30 DIAGNOSIS — E876 Hypokalemia: Secondary | ICD-10-CM

## 2012-12-30 LAB — C-REACTIVE PROTEIN: CRP: 1.6 mg/dL — ABNORMAL HIGH (ref <0.60)

## 2012-12-30 LAB — CBC WITH DIFFERENTIAL/PLATELET
Basophils Absolute: 0 10*3/uL (ref 0.0–0.1)
Basophils Relative: 0 % (ref 0–1)
Eosinophils Absolute: 0 10*3/uL (ref 0.0–0.7)
Eosinophils Relative: 0 % (ref 0–5)
HCT: 41.5 % (ref 36.0–46.0)
Hemoglobin: 13.5 g/dL (ref 12.0–15.0)
Lymphocytes Relative: 39 % (ref 12–46)
Lymphs Abs: 2.7 10*3/uL (ref 0.7–4.0)
MCH: 26.4 pg (ref 26.0–34.0)
MCHC: 32.5 g/dL (ref 30.0–36.0)
MCV: 81.2 fL (ref 78.0–100.0)
Monocytes Absolute: 0.6 10*3/uL (ref 0.1–1.0)
Monocytes Relative: 9 % (ref 3–12)
Neutro Abs: 3.4 10*3/uL (ref 1.7–7.7)
Neutrophils Relative %: 51 % (ref 43–77)
Platelets: 212 10*3/uL (ref 150–400)
RBC: 5.11 MIL/uL (ref 3.87–5.11)
RDW: 16.5 % — ABNORMAL HIGH (ref 11.5–15.5)
WBC: 6.7 10*3/uL (ref 4.0–10.5)

## 2012-12-30 LAB — BASIC METABOLIC PANEL
BUN: 19 mg/dL (ref 6–23)
CO2: 29 mEq/L (ref 19–32)
Calcium: 9.5 mg/dL (ref 8.4–10.5)
Chloride: 100 mEq/L (ref 96–112)
Creatinine, Ser: 1.78 mg/dL — ABNORMAL HIGH (ref 0.50–1.10)
GFR calc Af Amer: 34 mL/min — ABNORMAL LOW (ref >90)
GFR calc non Af Amer: 29 mL/min — ABNORMAL LOW (ref >90)
Glucose, Bld: 94 mg/dL (ref 70–99)
Potassium: 3.2 mEq/L — ABNORMAL LOW (ref 3.5–5.1)
Sodium: 140 mEq/L (ref 135–145)

## 2012-12-30 LAB — TYPE AND SCREEN
ABO/RH(D): O POS
Antibody Screen: NEGATIVE

## 2012-12-30 LAB — PROTIME-INR
INR: 1.03 (ref 0.00–1.49)
Prothrombin Time: 13.4 seconds (ref 11.6–15.2)

## 2012-12-30 LAB — SEDIMENTATION RATE: Sed Rate: 40 mm/hr — ABNORMAL HIGH (ref 0–22)

## 2012-12-30 LAB — SURGICAL PCR SCREEN
MRSA, PCR: POSITIVE — AB
Staphylococcus aureus: POSITIVE — AB

## 2012-12-30 LAB — APTT: aPTT: 31 seconds (ref 24–37)

## 2012-12-30 MED ORDER — POTASSIUM CHLORIDE ER 10 MEQ PO TBCR: EXTENDED_RELEASE_TABLET | ORAL | Status: DC

## 2012-12-30 NOTE — Patient Instructions (Addendum)
      Diane [name]  12/30/2012   Your procedure is scheduled on:   01/03/2013   Report to [facility] at  845  AM.  Call this number if you have problems the morning of surgery: [phone]   Remember:   Do not eat food or drink liquids after midnight.   Take these medicines the morning of surgery with A SIP OF WATER:  Clonidine,levothyroxine,oxybutynin,elavil,depakote,xanax,morphine,gabapentin,percocet.   Do not wear jewelry, make-up or nail polish.  Do not wear lotions, powders, or perfumes.  Do not shave 48 hours prior to surgery. Men may shave face and neck.  Do not bring valuables to the hospital.  Contacts, dentures or bridgework may not be worn into surgery.  Leave suitcase in the car. After surgery it may be brought to your room.  For patients admitted to the hospital, checkout time is 11:00 AM the day of discharge.   Patients discharged the day of surgery will not be allowed to drive  home.  Name and phone number of your driver: family  Special Instructions: Shower using CHG 2 nights before surgery and the night before surgery.  If you shower the day of surgery use CHG.  Use special wash - you have one bottle of CHG for all showers.  You should use approximately 1/3 of the bottle for each shower.   Please read over the following fact sheets that you were given: Pain Booklet, Blood Transfusion Information, MRSA Information, Surgical Site Infection Prevention, Anesthesia Post-op Instructions and Care and Recovery After Surgery PATIENT INSTRUCTIONS POST-ANESTHESIA  IMMEDIATELY FOLLOWING SURGERY:  Do not drive or operate machinery for the first twenty four hours after surgery.  Do not make any important decisions for twenty four hours after surgery or while taking narcotic pain medications or sedatives.  If you develop intractable nausea and vomiting or a severe headache please notify your doctor immediately.  FOLLOW-UP:  Please make an appointment with your surgeon as instructed. You  do not need to follow up with anesthesia unless specifically instructed to do so.  WOUND CARE INSTRUCTIONS (if applicable):  Keep a dry clean dressing on the anesthesia/puncture wound site if there is drainage.  Once the wound has quit draining you may leave it open to air.  Generally you should leave the bandage intact for twenty four hours unless there is drainage.  If the epidural site drains for more than 36-48 hours please call the anesthesia department.  QUESTIONS?:  Please feel free to call your physician or the hospital operator if you have any questions, and they will be happy to assist you.

## 2012-12-30 NOTE — Pre-Procedure Instructions (Signed)
Patient left message to get Rx filled for Mupircion due to staph and MRSA positive. Called sed rate of 40 to Tammy at Dr Harrison's office and let her know about + results as well as Potassium of 3.2. Tammy states that Dr Romeo Apple has already asked her to call  patient rx for potassium into drug store and left a message for patient to pick this up. She will let Dr Romeo Apple know about + positive results and sed rate.

## 2012-12-31 ENCOUNTER — Ambulatory Visit (HOSPITAL_COMMUNITY): Payer: Self-pay | Admitting: Psychiatry

## 2012-12-31 NOTE — Pre-Procedure Instructions (Signed)
Dr Jayme Cloud aware of sed rate and potassium. No further orders given.

## 2013-01-02 NOTE — H&P (Signed)
Diane [name] is an 62 y.o. female.   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  The patient also had complication of postoperative proximal wound breakdown treated with IV antibiotics for 6 weeks and eventually normalized C. reactive protein and sedimentation rate.  Noted on preoperative labs sedimentation rate and C-reactive protein are elevated again although the wound looks clean dry and intact with normal healing though some scarring  Her symptoms are mainly included lateral patellofemoral pain really since her index operation this has never really stopped.  She presents for arthrotomy and evaluation of the patella with plan for debridement and possible lateral release  Past Medical History  Diagnosis Date  . Hypertension   . Allergic rhinitis   . Anxiety   . Depression   . GERD (gastroesophageal reflux disease)   . High cholesterol   . Hypothyroid   . Chronic back pain   . Arthritis   . Immature cataract   . Sleep apnea     STOP BANG score=4  . Seizures 20 yrs ago    unknown etiology-on meds  . Headaches, cluster 3 yrs ago    last one  . Mania   . 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. PTSD (post-traumatic stress disorder)     Past Surgical History  Procedure Laterality Date  . Left foot  x 2  . Foot surgery      left foot-  . Partial hysterectomy    . Oophoectomy  bilateral  . Cholecystectomy    . Lumbar disc surgery  L4-5  . Back surgery    . Knee arthroscopy  01/2012    right  . Total knee arthroplasty  07/01/2012    Procedure: TOTAL KNEE ARTHROPLASTY;  Surgeon: Vickki Hearing, MD;  Location: [facility];  Service: Orthopedics;  Laterality: Right;    Family History  Problem Relation Age of Onset  . Heart disease    . Diabetes    . Alcohol abuse    .  Alcohol abuse Mother   . Anxiety disorder Mother   . Alcohol abuse Father   . Anxiety disorder Father   . Bipolar disorder Sister   . Alcohol abuse Brother   . Anxiety disorder Brother   . Drug abuse Brother   . Dementia Paternal Aunt   . Depression Neg Hx   . OCD Neg Hx   . Paranoid behavior Neg Hx   . Schizophrenia Neg Hx   . Sexual abuse Neg Hx   . Physical abuse Neg Hx   . ADD / ADHD Grandchild   . ADD / ADHD Grandchild   . Drug abuse Brother   . Alcohol abuse Brother   . Anxiety disorder Brother   . Seizures Brother   . Alcohol abuse Brother   . Anxiety disorder Brother   . Seizures Brother   . Alcohol abuse Brother   . Anxiety disorder Brother   . Alcohol abuse Brother   . Anxiety disorder Brother    Social History:  reports that she quit smoking about 9 years ago. Her smoking use included Cigarettes. She has a 1.25 pack-year smoking history. She does not have any smokeless tobacco history on file. She reports that  drinks alcohol. She reports that she does not use illicit drugs.  Allergies:  Allergies  Allergen Reactions  . Neomycin-Bacitracin Zn-Polymyx Other (See Comments)    Reaction: skin starts to become raw and peels off  . Penicillins Hives  . Sulfonamide Derivatives Hives and Swelling    No prescriptions prior to admission    No results found for this or any previous visit (from the past 48 hour(s)). No results found.  Review of Systems  Constitutional: Positive for malaise/fatigue. Negative for fever, chills, weight loss and diaphoresis.  Musculoskeletal: Positive for back pain and joint pain.  Neurological: Negative for weakness.    There were no vitals taken for this visit. Physical Exam  General appearance is normal, the patient is alert and oriented x3 with normal mood and affect. She does exhibit obesity.  Grooming and hygiene are normal  Her last set of vital signs in our office were normal.  Peripheral vascular system she has some  mild peripheral edema no varicose veins pulses are intact temperature is normal minimal edema no tenderness  Lymph nodes are negative  She is ambulatory with a cane she has a labored gait. Her per extremities are normal in terms of range of motion stability strength and alignment  Her left lower extremity exhibits no contracture subluxation atrophy or tremor  Her right knee has a painful range of motion throughout. She's never had any motion deficits once she recovered from her surgery her wound proximally shows scarring there is no tenderness there. There is tenderness and crepitus on range of motion in the lateral patella femoral area no joint effusion is detected strength deficits none muscle tone is normal knee joint is stable  Skin as described her other extremities normal  Sensory parameters touch pin proprioception normal coordination tests are deferred distally and lower extremity deep tendon reflexes also deferred but no pathologic reflexes   Assessment/Plan At this point after multiple months of trying to give her pain medication we think that we see on x-ray that the lateral bone of the patellas impinging on the prosthesis this may be present medially as well. The plan is to open the joint take cultures since her lab results are elevated  We will revise the bone around the patella and perhaps do a lateral release  Impression patellofemoral pain after total knee  Plan arthrotomy and patella debridement  Fuller Canada 01/02/2013, 4:22 PM

## 2013-01-03 ENCOUNTER — Observation Stay (HOSPITAL_COMMUNITY)
Admission: RE | Admit: 2013-01-03 | Discharge: 2013-01-05 | Disposition: A | Discharge status: Home / Self Care | Payer: Medicare HMO | Source: Ambulatory Visit | Attending: Orthopedic Surgery | Admitting: Orthopedic Surgery

## 2013-01-03 ENCOUNTER — Ambulatory Visit (HOSPITAL_COMMUNITY): Payer: Medicare HMO | Admitting: Anesthesiology

## 2013-01-03 ENCOUNTER — Encounter (HOSPITAL_COMMUNITY)
Admission: RE | Disposition: A | Discharge status: Home / Self Care | Payer: Self-pay | Source: Ambulatory Visit | Attending: Orthopedic Surgery

## 2013-01-03 ENCOUNTER — Encounter (HOSPITAL_COMMUNITY): Payer: Self-pay | Admitting: Anesthesiology

## 2013-01-03 ENCOUNTER — Encounter (HOSPITAL_COMMUNITY): Payer: Self-pay | Admitting: *Deleted

## 2013-01-03 DIAGNOSIS — F5105 Insomnia due to other mental disorder: Secondary | ICD-10-CM

## 2013-01-03 DIAGNOSIS — IMO0002 Reserved for concepts with insufficient information to code with codable children: Secondary | ICD-10-CM

## 2013-01-03 DIAGNOSIS — M25569 Pain in unspecified knee: Principal | ICD-10-CM | POA: Insufficient documentation

## 2013-01-03 DIAGNOSIS — F411 Generalized anxiety disorder: Secondary | ICD-10-CM

## 2013-01-03 DIAGNOSIS — M25561 Pain in right knee: Secondary | ICD-10-CM

## 2013-01-03 DIAGNOSIS — G4730 Sleep apnea, unspecified: Secondary | ICD-10-CM | POA: Insufficient documentation

## 2013-01-03 DIAGNOSIS — T8131XD Disruption of external operation (surgical) wound, not elsewhere classified, subsequent encounter: Secondary | ICD-10-CM

## 2013-01-03 DIAGNOSIS — Z01810 Encounter for preprocedural cardiovascular examination: Secondary | ICD-10-CM | POA: Insufficient documentation

## 2013-01-03 DIAGNOSIS — Z96651 Presence of right artificial knee joint: Secondary | ICD-10-CM

## 2013-01-03 DIAGNOSIS — Z01812 Encounter for preprocedural laboratory examination: Secondary | ICD-10-CM | POA: Insufficient documentation

## 2013-01-03 DIAGNOSIS — F39 Unspecified mood [affective] disorder: Secondary | ICD-10-CM

## 2013-01-03 DIAGNOSIS — I10 Essential (primary) hypertension: Secondary | ICD-10-CM | POA: Insufficient documentation

## 2013-01-03 DIAGNOSIS — M1710 Unilateral primary osteoarthritis, unspecified knee: Secondary | ICD-10-CM | POA: Insufficient documentation

## 2013-01-03 DIAGNOSIS — G894 Chronic pain syndrome: Secondary | ICD-10-CM

## 2013-01-03 HISTORY — PX: KNEE ARTHROSCOPY WITH LATERAL RELEASE: SHX5649

## 2013-01-03 HISTORY — PX: INCISION AND DRAINAGE: SHX5863

## 2013-01-03 HISTORY — PX: TOTAL KNEE REVISION: SHX996

## 2013-01-03 SURGERY — TOTAL KNEE REVISION
Anesthesia: General | Site: Knee | Laterality: Right | Wound class: Clean

## 2013-01-03 MED ORDER — FENTANYL CITRATE 0.05 MG/ML IJ SOLN: 25.0000 ug | INTRAMUSCULAR | Status: DC | PRN

## 2013-01-03 MED ORDER — ALUM & MAG HYDROXIDE-SIMETH 200-200-20 MG/5ML PO SUSP: 30.0000 mL | ORAL | Status: DC | PRN

## 2013-01-03 MED ORDER — 0.9 % SODIUM CHLORIDE (POUR BTL) OPTIME
TOPICAL | Status: DC | PRN
  Administered 2013-01-03: 1000 mL

## 2013-01-03 MED ORDER — POTASSIUM CHLORIDE CRYS ER 10 MEQ PO TBCR
10.0000 meq | EXTENDED_RELEASE_TABLET | Freq: Three times a day (TID) | ORAL | Status: DC
  Administered 2013-01-03 – 2013-01-05 (×6): 10 meq via ORAL
  Filled 2013-01-03 (×16): qty 1

## 2013-01-03 MED ORDER — PHENYLEPHRINE HCL 10 MG/ML IJ SOLN
INTRAMUSCULAR | Status: AC
  Filled 2013-01-03: qty 1

## 2013-01-03 MED ORDER — GABAPENTIN 300 MG PO CAPS
300.0000 mg | ORAL_CAPSULE | Freq: Four times a day (QID) | ORAL | Status: DC
  Administered 2013-01-04: 300 mg via ORAL
  Filled 2013-01-03 (×3): qty 1

## 2013-01-03 MED ORDER — LIDOCAINE HCL (PF) 1 % IJ SOLN
INTRAMUSCULAR | Status: AC
  Filled 2013-01-03: qty 5

## 2013-01-03 MED ORDER — AMITRIPTYLINE HCL 25 MG PO TABS
50.0000 mg | ORAL_TABLET | Freq: Every day | ORAL | Status: DC
  Administered 2013-01-03 – 2013-01-04 (×2): 50 mg via ORAL
  Filled 2013-01-03 (×2): qty 2

## 2013-01-03 MED ORDER — ONDANSETRON HCL 4 MG/2ML IJ SOLN
4.0000 mg | Freq: Once | INTRAMUSCULAR | Status: AC
  Administered 2013-01-03: 4 mg via INTRAVENOUS

## 2013-01-03 MED ORDER — ONDANSETRON HCL 4 MG PO TABS: 4.0000 mg | ORAL_TABLET | Freq: Four times a day (QID) | ORAL | Status: DC | PRN

## 2013-01-03 MED ORDER — METOCLOPRAMIDE HCL 5 MG/ML IJ SOLN: 5.0000 mg | Freq: Three times a day (TID) | INTRAMUSCULAR | Status: DC | PRN

## 2013-01-03 MED ORDER — FENTANYL CITRATE 0.05 MG/ML IJ SOLN
INTRAMUSCULAR | Status: AC
  Filled 2013-01-03: qty 2

## 2013-01-03 MED ORDER — OXYCODONE HCL 5 MG PO TABS
ORAL_TABLET | ORAL | Status: AC
  Filled 2013-01-03: qty 1

## 2013-01-03 MED ORDER — ADULT MULTIVITAMIN W/MINERALS CH
1.0000 | ORAL_TABLET | Freq: Every day | ORAL | Status: DC
  Administered 2013-01-04 – 2013-01-05 (×2): 1 via ORAL
  Filled 2013-01-03 (×3): qty 1

## 2013-01-03 MED ORDER — EPHEDRINE SULFATE 50 MG/ML IJ SOLN
INTRAMUSCULAR | Status: DC | PRN
  Administered 2013-01-03 (×3): 10 mg via INTRAVENOUS

## 2013-01-03 MED ORDER — ONDANSETRON HCL 4 MG/2ML IJ SOLN: 4.0000 mg | Freq: Once | INTRAMUSCULAR | Status: DC | PRN

## 2013-01-03 MED ORDER — PHENYLEPHRINE HCL 10 MG/ML IJ SOLN
INTRAMUSCULAR | Status: DC | PRN
  Administered 2013-01-03 (×2): 50 ug via INTRAVENOUS

## 2013-01-03 MED ORDER — CLONIDINE HCL 0.2 MG PO TABS
0.2000 mg | ORAL_TABLET | Freq: Every day | ORAL | Status: DC
  Administered 2013-01-03 – 2013-01-04 (×2): 0.2 mg via ORAL
  Filled 2013-01-03 (×2): qty 1

## 2013-01-03 MED ORDER — SUCCINYLCHOLINE CHLORIDE 20 MG/ML IJ SOLN
INTRAMUSCULAR | Status: DC | PRN
  Administered 2013-01-03: 100 mg via INTRAVENOUS

## 2013-01-03 MED ORDER — SODIUM CHLORIDE 0.9 % IV SOLN
INTRAVENOUS | Status: DC
  Administered 2013-01-03: 100 mL/h via INTRAVENOUS
  Administered 2013-01-04 – 2013-01-05 (×2): via INTRAVENOUS

## 2013-01-03 MED ORDER — VANCOMYCIN HCL IN DEXTROSE 1-5 GM/200ML-% IV SOLN
INTRAVENOUS | Status: AC
  Filled 2013-01-03: qty 200

## 2013-01-03 MED ORDER — MIDAZOLAM HCL 2 MG/2ML IJ SOLN
INTRAMUSCULAR | Status: AC
  Filled 2013-01-03: qty 2

## 2013-01-03 MED ORDER — ONDANSETRON HCL 4 MG/2ML IJ SOLN
INTRAMUSCULAR | Status: AC
  Filled 2013-01-03: qty 2

## 2013-01-03 MED ORDER — FENTANYL CITRATE 0.05 MG/ML IJ SOLN
INTRAMUSCULAR | Status: DC | PRN
  Administered 2013-01-03 (×9): 50 ug via INTRAVENOUS

## 2013-01-03 MED ORDER — DIVALPROEX SODIUM 250 MG PO DR TAB
750.0000 mg | DELAYED_RELEASE_TABLET | Freq: Every day | ORAL | Status: DC
  Administered 2013-01-03 – 2013-01-04 (×2): 750 mg via ORAL
  Filled 2013-01-03 (×2): qty 3

## 2013-01-03 MED ORDER — LORATADINE 10 MG PO TABS
10.0000 mg | ORAL_TABLET | Freq: Every day | ORAL | Status: DC
  Administered 2013-01-03 – 2013-01-05 (×3): 10 mg via ORAL
  Filled 2013-01-03 (×4): qty 1

## 2013-01-03 MED ORDER — ROCURONIUM BROMIDE 100 MG/10ML IV SOLN
INTRAVENOUS | Status: DC | PRN
  Administered 2013-01-03: 5 mg via INTRAVENOUS

## 2013-01-03 MED ORDER — LACTATED RINGERS IV SOLN
INTRAVENOUS | Status: DC
  Administered 2013-01-03: 09:00:00 via INTRAVENOUS
  Administered 2013-01-03: 500 mL via INTRAVENOUS

## 2013-01-03 MED ORDER — FENTANYL CITRATE 0.05 MG/ML IJ SOLN
INTRAMUSCULAR | Status: AC
  Filled 2013-01-03: qty 5

## 2013-01-03 MED ORDER — MIDAZOLAM HCL 2 MG/2ML IJ SOLN
1.0000 mg | INTRAMUSCULAR | Status: DC | PRN
  Administered 2013-01-03 (×2): 2 mg via INTRAVENOUS

## 2013-01-03 MED ORDER — MUPIROCIN 2 % EX OINT
1.0000 "application " | TOPICAL_OINTMENT | Freq: Two times a day (BID) | CUTANEOUS | Status: DC
  Administered 2013-01-03 – 2013-01-05 (×4): 1 via NASAL
  Filled 2013-01-03 (×2): qty 22

## 2013-01-03 MED ORDER — OXYCODONE HCL 5 MG PO TABS
5.0000 mg | ORAL_TABLET | Freq: Once | ORAL | Status: DC
  Administered 2013-01-03: 5 mg via ORAL

## 2013-01-03 MED ORDER — SUCCINYLCHOLINE CHLORIDE 20 MG/ML IJ SOLN
INTRAMUSCULAR | Status: AC
  Filled 2013-01-03: qty 1

## 2013-01-03 MED ORDER — PROPOFOL 10 MG/ML IV EMUL
INTRAVENOUS | Status: AC
  Filled 2013-01-03: qty 20

## 2013-01-03 MED ORDER — VANCOMYCIN HCL IN DEXTROSE 1-5 GM/200ML-% IV SOLN
1000.0000 mg | INTRAVENOUS | Status: AC
  Administered 2013-01-03: 1000 mg via INTRAVENOUS

## 2013-01-03 MED ORDER — MORPHINE SULFATE ER 15 MG PO TBCR
15.0000 mg | EXTENDED_RELEASE_TABLET | Freq: Two times a day (BID) | ORAL | Status: DC
  Administered 2013-01-03 – 2013-01-05 (×5): 15 mg via ORAL
  Filled 2013-01-03 (×6): qty 1

## 2013-01-03 MED ORDER — PHENOL 1.4 % MT LIQD: 1.0000 | OROMUCOSAL | Status: DC | PRN

## 2013-01-03 MED ORDER — VANCOMYCIN HCL IN DEXTROSE 1-5 GM/200ML-% IV SOLN
1000.0000 mg | Freq: Three times a day (TID) | INTRAVENOUS | Status: AC
  Administered 2013-01-03 (×2): 1000 mg via INTRAVENOUS
  Filled 2013-01-03 (×2): qty 200

## 2013-01-03 MED ORDER — LIDOCAINE HCL (CARDIAC) 20 MG/ML IV SOLN
INTRAVENOUS | Status: DC | PRN
  Administered 2013-01-03: 50 mg via INTRAVENOUS

## 2013-01-03 MED ORDER — DIPHENHYDRAMINE HCL 12.5 MG/5ML PO ELIX: 12.5000 mg | ORAL_SOLUTION | ORAL | Status: DC | PRN

## 2013-01-03 MED ORDER — SODIUM CHLORIDE 0.9 % IR SOLN
Status: DC | PRN
  Administered 2013-01-03: 3000 mL

## 2013-01-03 MED ORDER — DEXTROSE 5 % IV SOLN: 500.0000 mg | Freq: Four times a day (QID) | INTRAVENOUS | Status: DC | PRN

## 2013-01-03 MED ORDER — HYDROMORPHONE HCL PF 1 MG/ML IJ SOLN: 1.0000 mg | INTRAMUSCULAR | Status: DC | PRN

## 2013-01-03 MED ORDER — BUPIVACAINE-EPINEPHRINE PF 0.5-1:200000 % IJ SOLN
INTRAMUSCULAR | Status: AC
  Filled 2013-01-03: qty 30

## 2013-01-03 MED ORDER — MENTHOL 3 MG MT LOZG
1.0000 | LOZENGE | OROMUCOSAL | Status: DC | PRN
  Filled 2013-01-03: qty 9

## 2013-01-03 MED ORDER — ALPRAZOLAM 0.5 MG PO TABS
0.5000 mg | ORAL_TABLET | Freq: Two times a day (BID) | ORAL | Status: DC
  Administered 2013-01-03 – 2013-01-05 (×5): 0.5 mg via ORAL
  Filled 2013-01-03 (×6): qty 1

## 2013-01-03 MED ORDER — ACETAMINOPHEN 10 MG/ML IV SOLN
INTRAVENOUS | Status: AC
  Filled 2013-01-03: qty 100

## 2013-01-03 MED ORDER — RIVAROXABAN 10 MG PO TABS
10.0000 mg | ORAL_TABLET | Freq: Every day | ORAL | Status: DC
  Administered 2013-01-04 – 2013-01-05 (×2): 10 mg via ORAL
  Filled 2013-01-03 (×3): qty 1

## 2013-01-03 MED ORDER — CYCLOBENZAPRINE HCL 10 MG PO TABS
10.0000 mg | ORAL_TABLET | Freq: Three times a day (TID) | ORAL | Status: DC
  Administered 2013-01-03 – 2013-01-05 (×5): 10 mg via ORAL
  Filled 2013-01-03 (×6): qty 1

## 2013-01-03 MED ORDER — ROCURONIUM BROMIDE 50 MG/5ML IV SOLN
INTRAVENOUS | Status: AC
  Filled 2013-01-03: qty 1

## 2013-01-03 MED ORDER — OXYCODONE-ACETAMINOPHEN 5-325 MG PO TABS
1.0000 | ORAL_TABLET | ORAL | Status: DC
  Administered 2013-01-04 (×2): 1 via ORAL
  Filled 2013-01-03 (×3): qty 1

## 2013-01-03 MED ORDER — ACETAMINOPHEN 650 MG RE SUPP: 650.0000 mg | Freq: Four times a day (QID) | RECTAL | Status: DC | PRN

## 2013-01-03 MED ORDER — OXYBUTYNIN CHLORIDE 5 MG PO TABS
5.0000 mg | ORAL_TABLET | Freq: Two times a day (BID) | ORAL | Status: DC
  Administered 2013-01-03 – 2013-01-05 (×4): 5 mg via ORAL
  Filled 2013-01-03 (×6): qty 1

## 2013-01-03 MED ORDER — OXYCODONE HCL 5 MG PO TABS: 5.0000 mg | ORAL_TABLET | ORAL | Status: DC | PRN

## 2013-01-03 MED ORDER — EPHEDRINE SULFATE 50 MG/ML IJ SOLN
INTRAMUSCULAR | Status: AC
  Filled 2013-01-03: qty 1

## 2013-01-03 MED ORDER — ACETAMINOPHEN 325 MG PO TABS: 650.0000 mg | ORAL_TABLET | Freq: Four times a day (QID) | ORAL | Status: DC | PRN

## 2013-01-03 MED ORDER — BUPIVACAINE-EPINEPHRINE 0.5% -1:200000 IJ SOLN
INTRAMUSCULAR | Status: DC | PRN
  Administered 2013-01-03: 90 mL

## 2013-01-03 MED ORDER — METHOCARBAMOL 500 MG PO TABS: 500.0000 mg | ORAL_TABLET | Freq: Four times a day (QID) | ORAL | Status: DC | PRN

## 2013-01-03 MED ORDER — PROPOFOL 10 MG/ML IV BOLUS
INTRAVENOUS | Status: DC | PRN
  Administered 2013-01-03: 150 mg via INTRAVENOUS

## 2013-01-03 MED ORDER — LEVOTHYROXINE SODIUM 75 MCG PO TABS
150.0000 ug | ORAL_TABLET | Freq: Every day | ORAL | Status: DC
  Administered 2013-01-04 – 2013-01-05 (×2): 150 ug via ORAL
  Filled 2013-01-03 (×3): qty 2

## 2013-01-03 MED ORDER — METOCLOPRAMIDE HCL 10 MG PO TABS: 5.0000 mg | ORAL_TABLET | Freq: Three times a day (TID) | ORAL | Status: DC | PRN

## 2013-01-03 MED ORDER — CHLORHEXIDINE GLUCONATE 4 % EX LIQD: 60.0000 mL | Freq: Once | CUTANEOUS | Status: DC

## 2013-01-03 MED ORDER — ACETAMINOPHEN 10 MG/ML IV SOLN
1000.0000 mg | Freq: Once | INTRAVENOUS | Status: AC
  Administered 2013-01-03: 1000 mg via INTRAVENOUS

## 2013-01-03 MED ORDER — ONDANSETRON HCL 4 MG/2ML IJ SOLN: 4.0000 mg | Freq: Four times a day (QID) | INTRAMUSCULAR | Status: DC | PRN

## 2013-01-03 MED ORDER — ATORVASTATIN CALCIUM 10 MG PO TABS
10.0000 mg | ORAL_TABLET | Freq: Every day | ORAL | Status: DC
  Administered 2013-01-03 – 2013-01-04 (×2): 10 mg via ORAL
  Filled 2013-01-03 (×2): qty 1

## 2013-01-03 SURGICAL SUPPLY — 65 items
BAG HAMPER (MISCELLANEOUS) ×2 IMPLANT
BANDAGE ELASTIC 4 VELCRO NS (GAUZE/BANDAGES/DRESSINGS) ×4 IMPLANT
BANDAGE ELASTIC 6 VELCRO NS (GAUZE/BANDAGES/DRESSINGS) ×2 IMPLANT
BANDAGE ESMARK 6X9 LF (GAUZE/BANDAGES/DRESSINGS) ×1 IMPLANT
BLADE HEX COATED 2.75 (ELECTRODE) ×2 IMPLANT
BLADE SAG 18X100X1.27 (BLADE) IMPLANT
BLADE SAW SAG 90X13X1.27 (BLADE) IMPLANT
BNDG ESMARK 6X9 LF (GAUZE/BANDAGES/DRESSINGS) ×2
BOWL SMART MIX CTS (DISPOSABLE) IMPLANT
CATH KIT ON Q 2.5IN SLV (PAIN MANAGEMENT) IMPLANT
CHLORAPREP W/TINT 26ML (MISCELLANEOUS) ×4 IMPLANT
CLOTH BEACON ORANGE TIMEOUT ST (SAFETY) ×2 IMPLANT
COOLER CRYO CUFF IC AND MOTOR (MISCELLANEOUS) IMPLANT
COVER LIGHT HANDLE STERIS (MISCELLANEOUS) ×4 IMPLANT
COVER PROBE W GEL 5X96 (DRAPES) IMPLANT
CUFF TOURNIQUET SINGLE 24IN (TOURNIQUET CUFF) IMPLANT
CUFF TOURNIQUET SINGLE 34IN LL (TOURNIQUET CUFF) ×2 IMPLANT
CUFF TOURNIQUET SINGLE 44IN (TOURNIQUET CUFF) IMPLANT
DECANTER SPIKE VIAL GLASS SM (MISCELLANEOUS) ×6 IMPLANT
DRAIN TROCAR  MED 1/8 (DRAIN) IMPLANT
DRAPE BACK TABLE (DRAPES) IMPLANT
DRAPE EXTREMITY T 121X128X90 (DRAPE) ×2 IMPLANT
DRAPE INCISE IOBAN 66X45 STRL (DRAPES) ×2 IMPLANT
DRAPE U-SHAPE 47X51 STRL (DRAPES) IMPLANT
DRSG MEPILEX BORDER 4X12 (GAUZE/BANDAGES/DRESSINGS) ×2 IMPLANT
ELECT REM PT RETURN 9FT ADLT (ELECTROSURGICAL) ×2
ELECTRODE REM PT RTRN 9FT ADLT (ELECTROSURGICAL) ×1 IMPLANT
FACESHIELD LNG OPTICON STERILE (SAFETY) IMPLANT
GAUZE XEROFORM 5X9 LF (GAUZE/BANDAGES/DRESSINGS) ×2 IMPLANT
GLOVE BIOGEL PI IND STRL 7.0 (GLOVE) ×2 IMPLANT
GLOVE BIOGEL PI INDICATOR 7.0 (GLOVE) ×2
GLOVE ECLIPSE 6.5 STRL STRAW (GLOVE) ×6 IMPLANT
GLOVE SKINSENSE NS SZ8.0 LF (GLOVE) ×1
GLOVE SKINSENSE STRL SZ8.0 LF (GLOVE) ×1 IMPLANT
GLOVE SS N UNI LF 8.5 STRL (GLOVE) ×4 IMPLANT
GOWN STRL REIN XL XLG (GOWN DISPOSABLE) ×6 IMPLANT
HANDPIECE INTERPULSE COAX TIP (DISPOSABLE) ×1
HOOD W/PEELAWAY (MISCELLANEOUS) IMPLANT
INST SET MAJOR BONE (KITS) ×2 IMPLANT
IV NS IRRIG 3000ML ARTHROMATIC (IV SOLUTION) ×2 IMPLANT
KIT ROOM TURNOVER APOR (KITS) ×2 IMPLANT
MANIFOLD NEPTUNE II (INSTRUMENTS) ×2 IMPLANT
MARKER SKIN DUAL TIP RULER LAB (MISCELLANEOUS) ×2 IMPLANT
NEEDLE HYPO 18GX1.5 BLUNT FILL (NEEDLE) ×2 IMPLANT
NS IRRIG 1000ML POUR BTL (IV SOLUTION) ×2 IMPLANT
PACK TOTAL JOINT (CUSTOM PROCEDURE TRAY) ×2 IMPLANT
PAD ABD 5X9 TENDERSORB (GAUZE/BANDAGES/DRESSINGS) ×4 IMPLANT
PAD ARMBOARD 7.5X6 YLW CONV (MISCELLANEOUS) ×4 IMPLANT
PAD DANNIFLEX CPM (ORTHOPEDIC SUPPLIES) IMPLANT
PAD TELFA 3X4 1S STER (GAUZE/BANDAGES/DRESSINGS) ×2 IMPLANT
SET BASIN LINEN APH (SET/KITS/TRAYS/PACK) ×2 IMPLANT
SET HNDPC FAN SPRY TIP SCT (DISPOSABLE) ×1 IMPLANT
STAPLER VISISTAT 35W (STAPLE) ×2 IMPLANT
SUT BRALON NAB BRD #1 30IN (SUTURE) ×2 IMPLANT
SUT MON AB 0 CT1 (SUTURE) ×4 IMPLANT
SUT MON AB 2-0 CT1 36 (SUTURE) IMPLANT
SWAB CULTURE LIQ STUART DBL (MISCELLANEOUS) ×6 IMPLANT
SYR 30ML LL (SYRINGE) ×2 IMPLANT
SYR BULB IRRIGATION 50ML (SYRINGE) ×4 IMPLANT
TOWEL OR 17X26 4PK STRL BLUE (TOWEL DISPOSABLE) ×2 IMPLANT
TOWER CARTRIDGE SMART MIX (DISPOSABLE) IMPLANT
TRAY FOLEY CATH 14FR (SET/KITS/TRAYS/PACK) IMPLANT
TUBE ANAEROBIC PORT A CUL  W/M (MISCELLANEOUS) ×6 IMPLANT
WATER STERILE IRR 1000ML POUR (IV SOLUTION) IMPLANT
YANKAUER SUCT 12FT TUBE ARGYLE (SUCTIONS) ×2 IMPLANT

## 2013-01-03 NOTE — Brief Op Note (Signed)
01/03/2013  12:53 PM  PATIENT:  Diane [name]  62 y.o. female  PRE-OPERATIVE DIAGNOSIS:  patellofemoral pain, status post total  right knee  POST-OPERATIVE DIAGNOSIS:  patellofemoral pain, status post total  right knee  PROCEDURE:  Procedure(s): Patellaplasty Right Knee (Right) Lateral Release Patella Right Knee (Right) INCISION AND DRAINAGE (Right)  SURGEON:  Surgeon(s) and Role:    * Vickki Hearing, MD - Primary  PHYSICIAN ASSISTANT:   ASSISTANTS: cynthia wrenn   ANESTHESIA:   general  EBL:  Total I/O In: 900 [I.V.:900] Out: 0   BLOOD ADMINISTERED:none  DRAINS: hemovac 10 holes    LOCAL MEDICATIONS USED:  MARCAINE with epi    and Amount: 90 ml  SPECIMEN:  Source of Specimen:  3 anaerobic and aerobic cultures and 3 tissue cultures  DISPOSITION OF SPECIMEN:  micro and path   COUNTS:  YES  TOURNIQUET:   Total Tourniquet Time Documented: Thigh (Right) - 60 minutes Total: Thigh (Right) - 60 minutes   DICTATION: .Reubin Milan Dictation  PLAN OF CARE: Admit for overnight observation  PATIENT DISPOSITION:  PACU - hemodynamically stable.   Delay start of Pharmacological VTE agent (>24hrs) due to surgical blood loss or risk of bleeding: yes

## 2013-01-03 NOTE — Transfer of Care (Signed)
Immediate Anesthesia Transfer of Care Note  Patient: Diane [name]  Procedure(s) Performed: Procedure(s): Patellaplasty Right Knee (Right) Lateral Release Patella Right Knee (Right) INCISION AND DRAINAGE (Right)  Patient Location: PACU  Anesthesia Type:General  Level of Consciousness: sedated  Airway & Oxygen Therapy: Patient Spontanous Breathing and Patient connected to face mask oxygen  Post-op Assessment: Report given to PACU RN  Post vital signs: Reviewed and stable  Complications: No apparent anesthesia complications

## 2013-01-03 NOTE — Anesthesia Procedure Notes (Signed)
Procedure Name: Intubation Date/Time: 01/03/2013 11:07 AM Performed by: Glynn Octave E Pre-anesthesia Checklist: Patient identified, Patient being monitored, Timeout performed, Emergency Drugs available and Suction available Patient Re-evaluated:Patient Re-evaluated prior to inductionOxygen Delivery Method: Circle System Utilized Preoxygenation: Pre-oxygenation with 100% oxygen Intubation Type: IV induction, Rapid sequence and Cricoid Pressure applied Ventilation: Mask ventilation without difficulty Laryngoscope Size: Mac and 3 Grade View: Grade I Tube type: Oral Tube size: 7.0 mm Number of attempts: 1 Airway Equipment and Method: stylet Placement Confirmation: ETT inserted through vocal cords under direct vision,  positive ETCO2 and breath sounds checked- equal and bilateral Secured at: 21 cm Tube secured with: Tape Dental Injury: Teeth and Oropharynx as per pre-operative assessment

## 2013-01-03 NOTE — Anesthesia Preprocedure Evaluation (Signed)
Anesthesia Evaluation  Patient identified by MRN, date of birth, ID band Patient awake    Reviewed: Allergy & Precautions, H&P , NPO status , Patient's Chart, lab work & pertinent test results  Airway Mallampati: II TM Distance: >3 FB Neck ROM: Full    Dental  (+) Edentulous Upper   Pulmonary sleep apnea ,    Pulmonary exam normal       Cardiovascular hypertension, Pt. on medications Rhythm:Regular Rate:Normal     Neuro/Psych  Headaches, Seizures -, Well Controlled,  PSYCHIATRIC DISORDERS Anxiety Depression    GI/Hepatic GERD-  Medicated,  Endo/Other  Hypothyroidism   Renal/GU      Musculoskeletal  (+) Arthritis -,   Abdominal (+) + obese,  Abdomen: soft.    Peds  Hematology negative hematology ROS (+)   Anesthesia Other Findings   Reproductive/Obstetrics negative OB ROS                           Anesthesia Physical Anesthesia Plan  ASA: III  Anesthesia Plan: General   Post-op Pain Management:    Induction: Intravenous, Rapid sequence and Cricoid pressure planned  Airway Management Planned: Oral ETT  Additional Equipment:   Intra-op Plan:   Post-operative Plan: Extubation in OR  Informed Consent: I have reviewed the patients History and Physical, chart, labs and discussed the procedure including the risks, benefits and alternatives for the proposed anesthesia with the patient or authorized representative who has indicated his/her understanding and acceptance.     Plan Discussed with:   Anesthesia Plan Comments:         Anesthesia Quick Evaluation

## 2013-01-03 NOTE — Interval H&P Note (Signed)
History and Physical Interval Note:  01/03/2013 10:32 AM  Diane [name]  has presented today for surgery, with the diagnosis of patellofemoral pain, s/p TKA right knee  The various methods of treatment have been discussed with the patient and family. After consideration of risks, benefits and other options for treatment, the patient has consented to  Procedure(s) with comments: PATELLA TRIMMING OF EXCESS BONE AND LATERAL RELEASE (Right) - no revision, just patella trimming of excess bone and lateral release  as a surgical intervention .  The patient's history has been reviewed, patient examined, no change in status, stable for surgery.  I have reviewed the patient's chart and labs.  Questions were answered to the patient's satisfaction.     Fuller Canada

## 2013-01-03 NOTE — Op Note (Signed)
Details of procedure  The patient was identified in the preop area and the right knee was marked as the surgical site after confirmation. Chart was completed  The patient was taken to the operating room for general anesthesia and administering of appropriate IV antibiotic based on her penicillin allergy and MRSA contact precautions  The right leg was prepped and draped sterilely and a timeout was completed.  The limb was exsanguinated with a six-inch Esmarch and the tourniquet was elevated to 300 mm mercury (tourniquet inflated 59 minutes).   The previous incision was used and a midline incision was completed down to the extensor mechanism. An arthrotomy was performed and cultures were obtained from the lateral gutter medial gutter and center of the joint. Tissue cultures were then taken from the suprapatellar pouch medial and lateral gutters.  The patella was everted and the bone on the lateral part of the patella was skeletonized and removed leaving the patella covering all of the bone on the patella. The gutters were reestablished.  Thorough irrigation and debridement was performed.  After ranging the knee a lateral release was performed. Excellent patella tracking was noted.  A Hemovac drain was placed in the joint and the joint was closed with interrupted #1 Bralon suture followed by 0 Monocryl suture to close the subcutaneous tissue. Staples were used to reapproximate the skin edges. A total of 60 cc an Marcaine with epinephrine was injected in the joint and 30 cc along the wound edge.  After extubation the patient was taken to the recovery room in stable condition

## 2013-01-03 NOTE — Anesthesia Postprocedure Evaluation (Addendum)
  Anesthesia Post-op Note  Patient: Diane [name]  Procedure(s) Performed: Procedure(s): Patellaplasty Right Knee (Right) Lateral Release Patella Right Knee (Right) INCISION AND DRAINAGE (Right)  Patient Location: PACU  Anesthesia Type:General  Level of Consciousness: sedated  Airway and Oxygen Therapy: Patient Spontanous Breathing and Patient connected to face mask oxygen  Post-op Pain: none  Post-op Assessment: Post-op Vital signs reviewed, Patient's Cardiovascular Status Stable, Respiratory Function Stable, Patent Airway and No signs of Nausea or vomiting  Post-op Vital Signs: Reviewed and stable  Complications: No apparent anesthesia complications Pt. Has no recall of procedure, VSS, working with PT.  No apparent anesthesia complications.

## 2013-01-03 NOTE — Evaluation (Signed)
Physical Therapy Evaluation Patient Details Name: Diane [name] MRN: [id number] DOB: [birth year] Today's Date: 01/03/2013 Time: 4098-1191 PT Time Calculation (min): 30 min  PT Assessment / Plan / Recommendation Clinical Impression  Pt is a 62 yo female s/p TKR in october of 2013.  Pt underwent a lateral release on her R LE and will benefit from skilled PT to maximize functional potential.    PT Assessment  Patient needs continued PT services    Follow Up Recommendations  Outpatient PT    Does the patient have the potential to tolerate intense rehabilitation    no  Barriers to Discharge None      Equipment Recommendations  None recommended by PT    Recommendations for Other Services   none  Frequency 7X/week    Precautions / Restrictions Precautions Precautions: Fall Required Braces or Orthoses: Knee Immobilizer - Right Knee Immobilizer - Right: On when out of bed or walking Restrictions Weight Bearing Restrictions: Yes RLE Weight Bearing: Weight bearing as tolerated   Pertinent Vitals/Pain 3/10     Mobility  Bed Mobility Bed Mobility: Supine to Sit Supine to Sit: 4: Min assist Transfers Transfers: Sit to Stand;Stand to Sit;Stand Pivot Transfers Sit to Stand: 3: Mod assist Stand to Sit: 4: Min assist Stand Pivot Transfers: 3: Mod assist    Exercises     PT Diagnosis: Difficulty walking;Generalized weakness  PT Problem List: Decreased strength;Decreased activity tolerance;Decreased mobility;Pain PT Treatment Interventions: Gait training;Therapeutic exercise;Stair training;Therapeutic activities   PT Goals Acute Rehab PT Goals PT Goal Formulation: With patient Time For Goal Achievement: 01/06/13 Potential to Achieve Goals: Good Pt will go Supine/Side to Sit: with modified independence PT Goal: Supine/Side to Sit - Progress: Goal set today Pt will go Sit to Stand: with modified independence PT Goal: Sit to Stand - Progress: Goal set today Pt will go Stand  to Sit: with modified independence PT Goal: Stand to Sit - Progress: Goal set today Pt will Transfer Bed to Chair/Chair to Bed: with modified independence PT Transfer Goal: Bed to Chair/Chair to Bed - Progress: Goal set today Pt will Ambulate: 51 - 150 feet;with rolling walker;with supervision PT Goal: Ambulate - Progress: Goal set today Pt will Go Up / Down Stairs: 3-5 stairs;with mod assist PT Goal: Up/Down Stairs - Progress: Goal set today  Visit Information  Last PT Received On: 01/03/13    Subjective Data      Prior Functioning  Home Living Lives With:  (niece) Available Help at Discharge: Family Type of Home: House Home Access: Stairs to enter Secretary/administrator of Steps: 3 Entrance Stairs-Rails: Right Home Layout: One level Firefighter: Standard Home Adaptive Equipment: Straight cane;Walker - rolling Prior Function Level of Independence: Independent Able to Take Stairs?: No Driving: Yes Vocation: Retired Musician: No difficulties    Copywriter, advertising Arousal/Alertness: Awake/alert Overall Cognitive Status: Within Functional Limits for tasks assessed    Extremity/Trunk Assessment Right Lower Extremity Assessment RLE ROM/Strength/Tone: Unable to fully assess Left Lower Extremity Assessment LLE ROM/Strength/Tone: [facility] for tasks assessed   Balance    End of Session PT - End of Session Equipment Utilized During Treatment: Gait belt Activity Tolerance: Patient tolerated treatment well Patient left: in bed;with call bell/phone within reach Nurse Communication: Mobility status  GP     Dima Mini,CINDY 01/03/2013, 6:45 PM

## 2013-01-04 MED ORDER — GABAPENTIN 100 MG PO CAPS
100.0000 mg | ORAL_CAPSULE | Freq: Three times a day (TID) | ORAL | Status: DC
  Administered 2013-01-04 – 2013-01-05 (×2): 100 mg via ORAL
  Filled 2013-01-04 (×3): qty 1

## 2013-01-04 NOTE — Progress Notes (Signed)
Physical Therapy Treatment Patient Details Name: Diane [name] MRN: [id number] DOB: [birth year] Today's Date: 01/04/2013 Time: 0852-0950 PT Time Calculation (min): 58 min  PT Assessment / Plan / Recommendation Comments on Treatment Session  Therex for Rt Knee strengthening and ROM (AAROM) complete without difficulty.  Min-mod assistance with bed mobility with cueing for hand placement to assist.  Gait training with min assistance with RW with cueing to increase stride length.  Pt left in chair with call bell within reach, family and nurse present in room.    Follow Up Recommendations        Does the patient have the potential to tolerate intense rehabilitation     Barriers to Discharge        Equipment Recommendations       Recommendations for Other Services    Frequency     Plan      Precautions / Restrictions Precautions Precautions: Fall Required Braces or Orthoses: Knee Immobilizer - Right Knee Immobilizer - Right: On when out of bed or walking Restrictions Weight Bearing Restrictions: Yes RLE Weight Bearing: Weight bearing as tolerated   Pertinent Vitals/Pain     Mobility  Bed Mobility Bed Mobility: Supine to Sit Supine to Sit: 4: Min assist Transfers Transfers: Sit to Stand;Stand to Sit Sit to Stand: 3: Mod assist Stand to Sit: 4: Min assist Ambulation/Gait Ambulation/Gait Assistance: 4: Min assist Ambulation Distance (Feet): 24 Feet Assistive device: Rolling walker Gait Pattern: Decreased stance time - right;Decreased step length - left Gait velocity: slow and labored  Stairs: No    Exercises Total Joint Exercises Ankle Circles/Pumps: AROM;Right;10 reps Quad Sets: Right;10 reps Gluteal Sets: AROM;10 reps Short Arc Quad: AAROM;Right;10 reps Heel Slides: AAROM;Right;10 reps   PT Diagnosis:    PT Problem List:   PT Treatment Interventions:     PT Goals Acute Rehab PT Goals PT Goal: Supine/Side to Sit - Progress: Progressing toward goal PT Goal: Sit  to Stand - Progress: Progressing toward goal PT Goal: Stand to Sit - Progress: Progressing toward goal PT Transfer Goal: Bed to Chair/Chair to Bed - Progress: Progressing toward goal PT Goal: Ambulate - Progress: Progressing toward goal PT Goal: Up/Down Stairs - Progress: Not met  Visit Information  Last PT Received On: 01/04/13    Subjective Data  Subjective: Pt reported Rt knee pain scale 7/10 supine position   Cognition  Cognition Arousal/Alertness: Awake/alert Behavior During Therapy: WFL for tasks assessed/performed Overall Cognitive Status: Within Functional Limits for tasks assessed    Balance     End of Session PT - End of Session Equipment Utilized During Treatment: Gait belt Activity Tolerance: Patient tolerated treatment well;Patient limited by pain Patient left: in chair;with call bell/phone within reach;with chair alarm set;with nursing in room;with family/visitor present Nurse Communication: Mobility status   GP     Juel Burrow 01/04/2013, 9:55 AM

## 2013-01-04 NOTE — Progress Notes (Signed)
Subjective: 1 Day Post-Op Procedure(s) (LRB): Patellaplasty Right Knee (Right) Lateral Release Patella Right Knee (Right) INCISION AND DRAINAGE (Right) Patient reports pain as moderate.    Objective: Vital signs in last 24 hours: Temp:  [97.3 F (36.3 C)-98.3 F (36.8 C)] 98.3 F (36.8 C) (04/26 0545) Pulse Rate:  [72-83] 72 (04/25 2143) Resp:  [9-28] 20 (04/26 0545) BP: (98-134)/(61-78) 128/70 mmHg (04/26 0545) SpO2:  [93 %-100 %] 99 % (04/26 0545)  Intake/Output from previous day: 04/25 0701 - 04/26 0700 In: 2293.3 [P.O.:500; I.V.:1603.3; IV Piggyback:100] Out: 70 [Drains:70] Intake/Output this shift:    No results found for this basename: HGB,  in the last 72 hours No results found for this basename: WBC, RBC, HCT, PLT,  in the last 72 hours No results found for this basename: NA, K, CL, CO2, BUN, CREATININE, GLUCOSE, CALCIUM,  in the last 72 hours No results found for this basename: LABPT, INR,  in the last 72 hours  Neurologically intact Neurovascular intact Sensation intact distally Intact pulses distally Dorsiflexion/Plantar flexion intact Compartment soft  Assessment/Plan: 1 Day Post-Op Procedure(s) (LRB): Patellaplasty Right Knee (Right) Lateral Release Patella Right Knee (Right) INCISION AND DRAINAGE (Right) Advance diet Up with therapy D/C IV fluids Decrease neurontin   Diane [name] 01/04/2013, 10:03 AM

## 2013-01-04 NOTE — Progress Notes (Signed)
Utilization Review Completed.   Desaray Marschner, RN, BSN Nurse Case Manager  [phone]  

## 2013-01-05 LAB — WOUND CULTURE
Culture: NO GROWTH
Culture: NO GROWTH
Culture: NO GROWTH

## 2013-01-05 MED ORDER — OXYCODONE-ACETAMINOPHEN 5-325 MG PO TABS: 1.0000 | ORAL_TABLET | ORAL | Status: DC | PRN

## 2013-01-05 MED ORDER — GABAPENTIN 300 MG PO CAPS: 300.0000 mg | ORAL_CAPSULE | Freq: Two times a day (BID) | ORAL | Status: DC

## 2013-01-05 NOTE — Progress Notes (Signed)
Pt discharged with prescriptions, carenotes, and instructions. Pt verbalized understanding. Pt left the floor via w/c with staff and family in stable condition.      

## 2013-01-05 NOTE — Discharge Summary (Signed)
Physician Discharge Summary  Patient ID: Diane [name] MRN: [id number] DOB/AGE: 63-07-1951 63 y.o.  Admit date: 01/03/2013 Discharge date: 01/05/2013  Admission Diagnoses: The primary encounter diagnosis was Knee pain, right. Diagnoses of OA (osteoarthritis) of knee, Mood disorder, ANXIETY, Chronic pain syndrome, Insomnia due to mental disorder, S/P total knee replacement, right, and Postoperative wound breakdown, subsequent encounter were also pertinent to this visit.   Discharge Diagnoses: Same Active Problems:   Knee pain   Discharged Condition: good  Hospital Course: The patient was admitted for revision of the patella and patella plasty status post right total knee arthroplasty with chronic right anterolateral patellofemoral pain. She tolerated the procedure well had cultures taken which showed no growth and negative Gram stain. Her patella was debrided the button was retained. She'll lateral release.  In the postop period she needed pain control and had physical therapy tolerated that well and was discharged home full weightbearing in a straight leg brace with a walker  Consults: None  Significant Diagnostic Studies: Negative cultures and Gram stain  Treatments: surgery: Surgical procedure as described  Discharge Exam: Blood pressure 115/70, pulse 80, temperature 98.3 F (36.8 C), temperature source Oral, resp. rate 18, height 5\' 3"  (1.6 m), weight 201 lb 11.5 oz (91.5 kg), SpO2 99.00%. The patient is awake alert and oriented. Compartments are soft incision is clean patient is ambulatory with a walker  Disposition: 06-Home-Health Care Svc  Discharge Orders   Future Appointments Provider Department Dept Phone   01/06/2013 3:00 PM Vickki Hearing, MD [facility] [phone]   Future Orders Complete By Expires     Diet - low sodium heart healthy  As directed     Discharge instructions  As directed     Comments:      Remove brace when not  walking   Start knee exercises Monday 25 knee bends 3 x a day    Driving Restrictions  As directed     Comments:      no    Increase activity slowly  As directed     Leave dressing on - Keep it clean, dry, and intact until clinic visit  As directed         Medication List    STOP taking these medications       potassium chloride 10 MEQ tablet  Commonly known as:  K-DUR      TAKE these medications       ALPRAZolam 0.5 MG tablet  Commonly known as:  XANAX  Take 0.5 mg by mouth 2 (two) times daily. For anxiety     amitriptyline 50 MG tablet  Commonly known as:  ELAVIL  Take 1 tablet (50 mg total) by mouth at bedtime.     cloNIDine 0.2 MG tablet  Commonly known as:  CATAPRES  Take 0.2 mg by mouth at bedtime.     cyclobenzaprine 10 MG tablet  Commonly known as:  FLEXERIL  Take 10 mg by mouth 3 (three) times daily. For spasms     divalproex 250 MG DR tablet  Commonly known as:  DEPAKOTE  Take 3 tablets (750 mg total) by mouth at bedtime.     gabapentin 300 MG capsule  Commonly known as:  NEURONTIN  Take 1 capsule (300 mg total) by mouth 2 (two) times daily.     LEVOTHROID 150 MCG tablet  Generic drug:  levothyroxine  Take 150 mcg by mouth daily.     loratadine 10 MG tablet  Commonly known as:  CLARITIN  Take 10 mg by mouth daily.     morphine 15 MG 12 hr tablet  Commonly known as:  MS CONTIN  Take 1 tablet (15 mg total) by mouth 2 (two) times daily.     multivitamin with minerals Tabs  Take 1 tablet by mouth daily.     oxybutynin 5 MG tablet  Commonly known as:  DITROPAN  Take 5 mg by mouth 2 (two) times daily.     oxyCODONE-acetaminophen 5-325 MG per tablet  Commonly known as:  PERCOCET/ROXICET  Take 1 tablet by mouth every 4 (four) hours as needed.     predniSONE 10 MG tablet  Commonly known as:  DELTASONE  Take 1 tablet (10 mg total) by mouth 3 (three) times daily.     rosuvastatin 40 MG tablet  Commonly known as:  CRESTOR  Take 40 mg by mouth  daily.           Follow-up Information   Follow up with Fuller Canada, MD On 01/13/2013. (staples out if ready )    Contact information:   [address] [place] [address] [phone]       Signed: Fuller Canada 01/05/2013, 9:09 AM

## 2013-01-05 NOTE — Progress Notes (Signed)
Utilization Review Completed.   Haasini Patnaude, RN, BSN Nurse Case Manager  [phone]  

## 2013-01-05 NOTE — Progress Notes (Signed)
Physical Therapy Treatment Patient Details Name: Diane [name] MRN: [id number] DOB: Feb 02, 1951 Today's Date: 01/05/2013 Time: 0903-1050 PT Time Calculation (min): 107 min  PT Assessment / Plan / Recommendation Comments on Treatment Session  Stair training complete prior discharge, pt able to follow demonstration and verbalize proper technique.  Pt with more difficulty and required more assistance/cueing with bed mobility today.  Pt with delayed response to all commands today, believe related to pain meds given prior to therapy.      Follow Up Recommendations        Does the patient have the potential to tolerate intense rehabilitation     Barriers to Discharge        Equipment Recommendations       Recommendations for Other Services    Frequency     Plan      Precautions / Restrictions Precautions Precautions: Fall Required Braces or Orthoses: Knee Immobilizer - Right Knee Immobilizer - Right: On when out of bed or walking Restrictions Weight Bearing Restrictions: Yes RLE Weight Bearing: Weight bearing as tolerated   Pertinent Vitals/Pain     Mobility  Bed Mobility Bed Mobility: Supine to Sit Supine to Sit: 3: Mod assist Details for Bed Mobility Assistance: more cueing required for hand placement to assist with bed mobility today Transfers Transfers: Sit to Stand;Stand to Sit Sit to Stand: 3: Mod assist Stand to Sit: 4: Min assist Stand Pivot Transfers: 3: Mod assist Details for Transfer Assistance: cueing for handplacement to assist Ambulation/Gait Ambulation/Gait Assistance: 4: Min assist Ambulation Distance (Feet): 24 Feet (10' 14') Assistive device: Rolling walker Gait Pattern: Decreased stance time - right;Decreased step length - left Gait velocity: slow and labored  Stairs: Yes Stair Management Technique: Backwards;With walker;Forwards;Two rails Number of Stairs: 3    Exercises Total Joint Exercises Ankle Circles/Pumps: AROM;Both;15 reps   PT  Diagnosis:    PT Problem List:   PT Treatment Interventions:     PT Goals Acute Rehab PT Goals PT Goal: Supine/Side to Sit - Progress: Progressing toward goal PT Goal: Sit to Stand - Progress: Progressing toward goal PT Goal: Stand to Sit - Progress: Progressing toward goal PT Transfer Goal: Bed to Chair/Chair to Bed - Progress: Progressing toward goal PT Goal: Ambulate - Progress: Progressing toward goal PT Goal: Up/Down Stairs - Progress: Met  Visit Information  Last PT Received On: 01/05/13    Subjective Data  Subjective: Feeling better today, Rt knee pain scale 6/10.   Cognition  Cognition Arousal/Alertness: Suspect due to medications (delayed response to all commands) Behavior During Therapy: WFL for tasks assessed/performed Overall Cognitive Status: Within Functional Limits for tasks assessed    Balance     End of Session PT - End of Session Equipment Utilized During Treatment: Gait belt Patient left: in chair;with call bell/phone within reach;with chair alarm set Nurse Communication: Mobility status   GP     Juel Burrow 01/05/2013, 10:53 AM

## 2013-01-06 ENCOUNTER — Telehealth: Payer: Self-pay | Admitting: Orthopedic Surgery

## 2013-01-06 ENCOUNTER — Encounter (HOSPITAL_COMMUNITY): Payer: Self-pay | Admitting: Orthopedic Surgery

## 2013-01-06 ENCOUNTER — Ambulatory Visit: Payer: Self-pay | Admitting: Orthopedic Surgery

## 2013-01-06 NOTE — Telephone Encounter (Signed)
Called patient to schedule per Dr. Mort Sawyers reply.

## 2013-01-06 NOTE — Telephone Encounter (Signed)
Patient called (x2) to inquire about visit from home health nurse , which she said was to be scheduled today.  States she was discharged to home yesterday 01/05/13; however, she does not know which home health agency she's been assigned to.  She has also called the hospital to find out this informatino.  Her main question is that she does not have a drainage container, to place the fluid into.    I have contacted both [facility] and [facility], and neither facility has orders for patient.  Please advise.  Her ph# is [phone]

## 2013-01-06 NOTE — Telephone Encounter (Signed)
Come in tomorrow  I donT know what this is all about

## 2013-01-07 ENCOUNTER — Ambulatory Visit (INDEPENDENT_AMBULATORY_CARE_PROVIDER_SITE_OTHER): Payer: Medicare HMO | Admitting: Orthopedic Surgery

## 2013-01-07 ENCOUNTER — Encounter: Payer: Self-pay | Admitting: Orthopedic Surgery

## 2013-01-07 VITALS — BP 112/64 | Ht 63.0 in | Wt 201.0 lb

## 2013-01-07 DIAGNOSIS — T8131XD Disruption of external operation (surgical) wound, not elsewhere classified, subsequent encounter: Secondary | ICD-10-CM

## 2013-01-07 DIAGNOSIS — Z5189 Encounter for other specified aftercare: Secondary | ICD-10-CM

## 2013-01-07 DIAGNOSIS — Z96651 Presence of right artificial knee joint: Secondary | ICD-10-CM

## 2013-01-07 DIAGNOSIS — Z96659 Presence of unspecified artificial knee joint: Secondary | ICD-10-CM

## 2013-01-07 MED ORDER — MORPHINE SULFATE ER 15 MG PO TBCR: 15.0000 mg | EXTENDED_RELEASE_TABLET | Freq: Two times a day (BID) | ORAL | Status: DC

## 2013-01-07 MED ORDER — PREDNISONE 10 MG PO TABS: 10.0000 mg | ORAL_TABLET | Freq: Three times a day (TID) | ORAL | Status: DC

## 2013-01-07 NOTE — Progress Notes (Signed)
Patient ID: Diane [name], female   DOB: 12/03/1950, 62 y.o.   MRN: [id number] Postop visit #1 status post irrigation debridement, cultures x 6, patella revision arthroplasty with button retention lateral release  Removed her drain her incision looks clean her cultures of the negative  [facility] will handle her home health PT and wound checks followup on Monday assess wound for possible staple removal

## 2013-01-08 LAB — ANAEROBIC CULTURE

## 2013-01-13 ENCOUNTER — Encounter: Payer: Self-pay | Admitting: Orthopedic Surgery

## 2013-01-13 ENCOUNTER — Ambulatory Visit (INDEPENDENT_AMBULATORY_CARE_PROVIDER_SITE_OTHER): Payer: Medicare HMO | Admitting: Orthopedic Surgery

## 2013-01-13 ENCOUNTER — Telehealth: Payer: Self-pay | Admitting: Orthopedic Surgery

## 2013-01-13 VITALS — BP 110/60 | Ht 63.0 in | Wt 201.0 lb

## 2013-01-13 DIAGNOSIS — Z96659 Presence of unspecified artificial knee joint: Secondary | ICD-10-CM

## 2013-01-13 DIAGNOSIS — Z96651 Presence of right artificial knee joint: Secondary | ICD-10-CM

## 2013-01-13 MED ORDER — ALPRAZOLAM 0.5 MG PO TABS: 0.5000 mg | ORAL_TABLET | Freq: Two times a day (BID) | ORAL | Status: DC

## 2013-01-13 MED ORDER — CYCLOBENZAPRINE HCL 10 MG PO TABS: 10.0000 mg | ORAL_TABLET | Freq: Three times a day (TID) | ORAL | Status: DC

## 2013-01-13 NOTE — Progress Notes (Signed)
Patient ID: Diane [name], female   DOB: [birth year], 62 y.o.   MRN: [id number] Chief Complaint  Patient presents with  . Follow-up    Post op Right patella arthoplasty DOS 01/03/13    Status post right total knee arthroplasty  Status post patella arthroplasty without revision on 01/03/2013  The patient came in today for staples to be removed her wound looks clean dry and intact all of her cultures have been negative  We will start home physical therapy and then we will see her again in about 4 weeks  Once the therapy starts the brace can be removed

## 2013-01-13 NOTE — Telephone Encounter (Signed)
Faxed order for home care to [facility], fax # [phone], (ph # [phone]) per request, as their office states they had not received order, per Sherrilyn Rist.  Done.

## 2013-01-13 NOTE — Telephone Encounter (Signed)
Diane [name] spoke with Sherrilyn Rist at [facility] and they did receive the order she faxed today. A nurse is supposed to begin her treatment tomorrow (01/14/13).

## 2013-01-14 NOTE — Telephone Encounter (Signed)
01/14/13 Patient called at 4:27p.m today, stating that she has not had a visit or a call from [facility].  We have contacted representative, Karsten Fells, and have left a voice message.  As of 5:30p.m, when I called back to patient to let her know we have left a message, she stated she had still not heard, or had a nurse visit.

## 2013-01-15 NOTE — Telephone Encounter (Signed)
Sherrilyn Rist, with [facility], confirmed that a nurse did visit Ms. Blake today (01/15/13).

## 2013-01-28 ENCOUNTER — Ambulatory Visit (HOSPITAL_COMMUNITY): Payer: Self-pay | Admitting: Psychiatry

## 2013-01-30 ENCOUNTER — Telehealth: Payer: Self-pay | Admitting: Orthopedic Surgery

## 2013-01-30 ENCOUNTER — Other Ambulatory Visit: Payer: Self-pay | Admitting: Orthopedic Surgery

## 2013-01-30 DIAGNOSIS — Z96651 Presence of right artificial knee joint: Secondary | ICD-10-CM

## 2013-01-30 DIAGNOSIS — T8131XD Disruption of external operation (surgical) wound, not elsewhere classified, subsequent encounter: Secondary | ICD-10-CM

## 2013-01-30 MED ORDER — OXYCODONE-ACETAMINOPHEN 5-325 MG PO TABS: 1.0000 | ORAL_TABLET | ORAL | Status: DC | PRN

## 2013-01-30 NOTE — Telephone Encounter (Signed)
Patient called to pick up script.

## 2013-01-30 NOTE — Telephone Encounter (Signed)
Diane [name] asked for a new prescription for Percocet, and says she does not think she needs the Morphine anymore. Her # [phone]

## 2013-01-30 NOTE — Telephone Encounter (Signed)
Prescription ready come to pick up

## 2013-02-11 ENCOUNTER — Ambulatory Visit (INDEPENDENT_AMBULATORY_CARE_PROVIDER_SITE_OTHER): Payer: Medicare HMO | Admitting: Orthopedic Surgery

## 2013-02-11 VITALS — BP 104/58 | Ht 63.0 in | Wt 201.0 lb

## 2013-02-11 DIAGNOSIS — Z5189 Encounter for other specified aftercare: Secondary | ICD-10-CM

## 2013-02-11 DIAGNOSIS — Z96659 Presence of unspecified artificial knee joint: Secondary | ICD-10-CM

## 2013-02-11 DIAGNOSIS — Z96651 Presence of right artificial knee joint: Secondary | ICD-10-CM

## 2013-02-11 DIAGNOSIS — T8131XD Disruption of external operation (surgical) wound, not elsewhere classified, subsequent encounter: Secondary | ICD-10-CM

## 2013-02-11 MED ORDER — OXYCODONE-ACETAMINOPHEN 5-325 MG PO TABS: 1.0000 | ORAL_TABLET | ORAL | Status: DC | PRN

## 2013-02-11 NOTE — Patient Instructions (Signed)
Continue therapy

## 2013-02-11 NOTE — Progress Notes (Signed)
Patient ID: Diane [name], female   DOB: 1951/02/11, 62 y.o.   MRN: [id number] Chief Complaint  Patient presents with  . Follow-up    4 week recheck post op DOS 01/03/13    BP 104/58  Ht 5\' 3"  (1.6 m)  Wt 201 lb (91.173 kg)  BMI 35.61 kg/m2 Status post patelloplasty revision right knee.  The patient reports that she's gotten quite a bit of pain relief from the surgery. She still having some discomfort she's using a cane receiving therapy at home  We will continue her therapy medications will be refilled and she'll followup in 6 weeks for 3 month postop visit

## 2013-02-21 ENCOUNTER — Encounter (HOSPITAL_COMMUNITY): Payer: Self-pay | Admitting: Psychiatry

## 2013-02-21 ENCOUNTER — Ambulatory Visit (INDEPENDENT_AMBULATORY_CARE_PROVIDER_SITE_OTHER): Payer: Medicare HMO | Admitting: Psychiatry

## 2013-02-21 VITALS — BP 133/77 | Ht 67.5 in | Wt 199.6 lb

## 2013-02-21 DIAGNOSIS — G894 Chronic pain syndrome: Secondary | ICD-10-CM

## 2013-02-21 DIAGNOSIS — F39 Unspecified mood [affective] disorder: Secondary | ICD-10-CM

## 2013-02-21 DIAGNOSIS — F3130 Bipolar disorder, current episode depressed, mild or moderate severity, unspecified: Secondary | ICD-10-CM

## 2013-02-21 DIAGNOSIS — F1310 Sedative, hypnotic or anxiolytic abuse, uncomplicated: Secondary | ICD-10-CM

## 2013-02-21 DIAGNOSIS — F411 Generalized anxiety disorder: Secondary | ICD-10-CM

## 2013-02-21 DIAGNOSIS — F1010 Alcohol abuse, uncomplicated: Secondary | ICD-10-CM

## 2013-02-21 DIAGNOSIS — F319 Bipolar disorder, unspecified: Secondary | ICD-10-CM

## 2013-02-21 DIAGNOSIS — Z96651 Presence of right artificial knee joint: Secondary | ICD-10-CM

## 2013-02-21 DIAGNOSIS — F5105 Insomnia due to other mental disorder: Secondary | ICD-10-CM

## 2013-02-21 MED ORDER — AMITRIPTYLINE HCL 100 MG PO TABS: 100.0000 mg | ORAL_TABLET | Freq: Every day | ORAL | Status: DC

## 2013-02-21 MED ORDER — DULOXETINE HCL 20 MG PO CPEP: 20.0000 mg | ORAL_CAPSULE | Freq: Two times a day (BID) | ORAL | Status: DC

## 2013-02-21 MED ORDER — ALPRAZOLAM 0.5 MG PO TABS: 0.5000 mg | ORAL_TABLET | Freq: Two times a day (BID) | ORAL | Status: DC

## 2013-02-21 MED ORDER — GABAPENTIN 300 MG PO CAPS: 600.0000 mg | ORAL_CAPSULE | Freq: Four times a day (QID) | ORAL | Status: DC

## 2013-02-21 MED ORDER — DIVALPROEX SODIUM 250 MG PO DR TAB: 750.0000 mg | DELAYED_RELEASE_TABLET | Freq: Every day | ORAL | Status: DC

## 2013-02-21 NOTE — Patient Instructions (Addendum)
Ask [facility] for Lidoderm patches for the back and some antiinflammatory by mouth AND Voltaren gel for the knees.  Set a timer for 8 or a certain number minutes and walk for that amount of time in the house or in the yard.  Mark the number of minutes on a calendar for that day.  Do that every day this week.  Then next week increase the time by 1 minutes and then mark the calendar with the number of minutes for that day.  Each week increase your exercise by one minute.  Keep a record of this so you can see the progress you are making.  Do this every day, just like eating and sleeping.  It is good for pain control, depression, and for your soul/spirit.  Bring the record in for your next visit so we can talk about your effort and how you feel with the new exercise program going and working for you.  Relaxation is the ultimate solution for you.  You can seek it through tub baths, bubble baths, essential oils or incense, walking or chatting with friends, listening to soft music, watching a candle burn and just letting all thoughts go and appreciating the true essence of the Creator.  Pets or animals may be very helpful.  You might spend some time with them and then go do more directed meditation.  "I am Wishes Fulfilled Meditation" by Marylene Buerger and Lyndal Pulley may be helpful MUSIC for getting to sleep or for meditating You can order it from on line.  You might find the Chill channel on Pandora and explore the artists that you like better.   GET BACK TO SINGING that is your creative spot and that is where you will find some healing.  Call if problems or concerns.

## 2013-02-21 NOTE — Progress Notes (Signed)
Diane [name] -[facility] [id number] Progress Note Diane [name] MRN: [id number] DOB: April 27, 1951 Age: 62 y.o.  Date: 02/21/2013 Start Time: 9:40 AM End Time: 10:20 AM  Chief Complaint: Chief Complaint  Patient presents with  . Depression  . Follow-up  . Medication Refill    Subjective: "I've stopped drinking since my last surgery". Depression 7/10 and Anxiety 8/10 where 1 is the best and 10 is the worst.  Pain is 10/10 mainly her back and knees.  History of present illness Patient is 62 year old [place] American female who came for her followup appointment.   Pt reports that she is compliant with the psychotropic medications and has the above struggles.  She is no longer an outgoing person like she used to be because of the pain.  Discussed adding back Cymbalta and excalating the dose of Neurontin as well as contacting [facility] to see if they will add Lidoderm for her back and Voltaren for her knees.    Continue Depakote as it seems to be working pretty well,.   Prior effective psychiatric medication Depakote 750 mg at bedtime Amitriptyline 50 mg at bedtime Neurontin 100 mg 3 times a day She is getting Xanax from her primary care physician.  Past psychiatric history Patient has history of one psychiatric admission in [facility]. She took overdose on her pills she was also intoxicated with alcohol. Patient has also seen by psychiatrist in 2004. At that time she was seeking more benzodiazepines and pain medication.  Vitals: BP 133/77  Ht 5' 7.5" (1.715 m)  Wt 199 lb 9.6 oz (90.538 kg)  BMI 30.78 kg/m2  Allergies: Allergies  Allergen Reactions  . Neomycin-Bacitracin Zn-Polymyx Other (See Comments)    Reaction: skin starts to become raw and peels off  . Penicillins Hives  . Sulfonamide Derivatives Hives and Swelling   Medical History: Past Medical History  Diagnosis Date  . Hypertension   . Allergic rhinitis   . Anxiety   . Depression   .  GERD (gastroesophageal reflux disease)   . High cholesterol   . Hypothyroid   . Chronic back pain   . Arthritis   . Immature cataract   . Sleep apnea     STOP BANG score=4  . Seizures 20 yrs ago    unknown etiology-on meds  . Headaches, cluster 3 yrs ago    last one  . Mania   . 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. PTSD (post-traumatic stress disorder)   Patient has history of hypertension, allergic rhinitis, hypothyroidism, chronic back pain, hyperlipidemia and obesity.  She most recently had surgery on her right knee.  Surgical History: Past Surgical History  Procedure Laterality Date  . Left foot  x 2  . Foot surgery      left foot-  . Partial hysterectomy    . Oophoectomy  bilateral  . Cholecystectomy    . Lumbar disc surgery  L4-5  . Back surgery    . Knee arthroscopy  01/2012    right  . Total knee arthroplasty  07/01/2012    Procedure: TOTAL KNEE ARTHROPLASTY;  Surgeon: Vickki Hearing, MD;  Location: [facility];  Service: Orthopedics;  Laterality: Right;  . Total knee revision Right 01/03/2013    Procedure: Patellaplasty Right Knee;  Surgeon: Vickki Hearing, MD;  Location: [facility];  Service: Orthopedics;  Laterality: Right;  . Knee arthroscopy with lateral release Right 01/03/2013    Procedure: Lateral Release Patella Right Knee;  Surgeon: Duffy Rhody  Elita Quick, MD;  Location: [facility];  Service: Orthopedics;  Laterality: Right;  . Incision and drainage Right 01/03/2013    Procedure: INCISION AND DRAINAGE;  Surgeon: Vickki Hearing, MD;  Location: [facility];  Service: Orthopedics;  Laterality: Right;   Family History: family history includes ADD / ADHD in her grandchildren; Alcohol abuse in her brothers, father, mother, and unspecified family member; Anxiety disorder in her brothers, father, and mother; Bipolar disorder in her sister; Dementia in her paternal aunt; Diabetes in an unspecified family member; Drug abuse in her brothers; Heart disease in an unspecified family member;  and Seizures in her brothers.  There is no history of Depression, and OCD, and Paranoid behavior, and Schizophrenia, and Sexual abuse, and Physical abuse, .   Psychosocial history Patient has been married for 16 years. She has 2 children. She is married however her relationship is very tense. She has history of leaving her husband in the past.  Currently she lives by herself.  Mental status examination Patient is mildly obese female who is casually dressed and fairly groomed.  She has difficulty walking due to pain.  She is cooperative and maintained fair eye contact. Her speech is clear and coherent. Her attention and concentration is fair. She denies any active or passive suicidal thinking and homicidal thinking. She's alert and oriented x3. There are no psychotic symptoms present at this time. She denies any auditory or visual hallucination. Her insight judgment is fair and her impulse control is okay.  Lab Results:  Results for orders placed during the hospital encounter of 01/03/13 (from the past 8736 hour(s))  WOUND CULTURE   Collection Time    01/03/13 10:05 AM      Result Value Range   Specimen Description KNEE RIGHT JOINT     Special Requests NONE     Gram Stain       Value: RARE WBC PRESENT, PREDOMINANTLY MONONUCLEAR     NO SQUAMOUS EPITHELIAL CELLS SEEN     NO ORGANISMS SEEN   Culture NO GROWTH 2 DAYS     Report Status 01/05/2013 FINAL    WOUND CULTURE   Collection Time    01/03/13 10:05 AM      Result Value Range   Specimen Description KNEE JOINT RIGHT     Special Requests NONE     Gram Stain       Value: RARE WBC PRESENT, PREDOMINANTLY PMN     NO SQUAMOUS EPITHELIAL CELLS SEEN     NO ORGANISMS SEEN   Culture NO GROWTH 2 DAYS     Report Status 01/05/2013 FINAL    WOUND CULTURE   Collection Time    01/03/13 10:05 AM      Result Value Range   Specimen Description KNEE RIGHT JOINT     Special Requests NONE     Gram Stain       Value: RARE WBC PRESENT,BOTH PMN AND  MONONUCLEAR     NO SQUAMOUS EPITHELIAL CELLS SEEN     NO ORGANISMS SEEN   Culture NO GROWTH 2 DAYS     Report Status 01/05/2013 FINAL    ANAEROBIC CULTURE   Collection Time    01/03/13 10:05 AM      Result Value Range   Specimen Description WOUND RIGHT KNEE JOINT 1 OF 3     Special Requests NONE     Gram Stain       Value: RARE WBC PRESENT, PREDOMINANTLY PMN     NO SQUAMOUS  EPITHELIAL CELLS SEEN     NO ORGANISMS SEEN   Culture NO ANAEROBES ISOLATED     Report Status 01/08/2013 FINAL    ANAEROBIC CULTURE   Collection Time    01/03/13 10:05 AM      Result Value Range   Specimen Description WOUND RIGHT KNEE JOINT 2 OF 3     Special Requests NONE     Gram Stain       Value: RARE WBC PRESENT,BOTH PMN AND MONONUCLEAR     NO SQUAMOUS EPITHELIAL CELLS SEEN     NO ORGANISMS SEEN   Culture NO ANAEROBES ISOLATED     Report Status 01/08/2013 FINAL    ANAEROBIC CULTURE   Collection Time    01/03/13 10:05 AM      Result Value Range   Specimen Description WOUND RIGHT KNEE JOINT 3 OF 3     Special Requests NONE     Gram Stain       Value: RARE WBC PRESENT, PREDOMINANTLY MONONUCLEAR     NO SQUAMOUS EPITHELIAL CELLS SEEN     NO ORGANISMS SEEN   Culture NO ANAEROBES ISOLATED     Report Status 01/08/2013 FINAL    Results for orders placed during the hospital encounter of 12/30/12 (from the past 8736 hour(s))  SURGICAL PCR SCREEN   Collection Time    12/30/12  9:59 AM      Result Value Range   MRSA, PCR POSITIVE (*) NEGATIVE   Staphylococcus aureus POSITIVE (*) NEGATIVE  C-REACTIVE PROTEIN   Collection Time    12/30/12 10:30 AM      Result Value Range   CRP 1.6 (*) <0.60 mg/dL  SEDIMENTATION RATE   Collection Time    12/30/12 10:30 AM      Result Value Range   Sed Rate 40 (*) 0 - 22 mm/hr  APTT   Collection Time    12/30/12 10:30 AM      Result Value Range   aPTT 31  24 - 37 seconds  BASIC METABOLIC PANEL   Collection Time    12/30/12 10:30 AM      Result Value Range    Sodium 140  135 - 145 mEq/L   Potassium 3.2 (*) 3.5 - 5.1 mEq/L   Chloride 100  96 - 112 mEq/L   CO2 29  19 - 32 mEq/L   Glucose, Bld 94  70 - 99 mg/dL   BUN 19  6 - 23 mg/dL   Creatinine, Ser 1.61 (*) 0.50 - 1.10 mg/dL   Calcium 9.5  8.4 - 09.6 mg/dL   GFR calc non Af Amer 29 (*) >90 mL/min   GFR calc Af Amer 34 (*) >90 mL/min  CBC WITH DIFFERENTIAL   Collection Time    12/30/12 10:30 AM      Result Value Range   WBC 6.7  4.0 - 10.5 K/uL   RBC 5.11  3.87 - 5.11 MIL/uL   Hemoglobin 13.5  12.0 - 15.0 g/dL   HCT 04.5  40.9 - 81.1 %   MCV 81.2  78.0 - 100.0 fL   MCH 26.4  26.0 - 34.0 pg   MCHC 32.5  30.0 - 36.0 g/dL   RDW 91.4 (*) 78.2 - 95.6 %   Platelets 212  150 - 400 K/uL   Neutrophils Relative % 51  43 - 77 %   Neutro Abs 3.4  1.7 - 7.7 K/uL   Lymphocytes Relative 39  12 - 46 %  Lymphs Abs 2.7  0.7 - 4.0 K/uL   Monocytes Relative 9  3 - 12 %   Monocytes Absolute 0.6  0.1 - 1.0 K/uL   Eosinophils Relative 0  0 - 5 %   Eosinophils Absolute 0.0  0.0 - 0.7 K/uL   Basophils Relative 0  0 - 1 %   Basophils Absolute 0.0  0.0 - 0.1 K/uL  PROTIME-INR   Collection Time    12/30/12 10:30 AM      Result Value Range   Prothrombin Time 13.4  11.6 - 15.2 seconds   INR 1.03  0.00 - 1.49  TYPE AND SCREEN   Collection Time    12/30/12 10:30 AM      Result Value Range   ABO/RH(D) O POS     Antibody Screen NEG     Sample Expiration 01/02/2013    Results for orders placed in visit on 11/04/12 (from the past 8736 hour(s))  CBC WITH DIFFERENTIAL   Collection Time    11/07/12  3:20 PM      Result Value Range   WBC 8.6  4.0 - 10.5 K/uL   RBC 4.33  3.87 - 5.11 MIL/uL   Hemoglobin 10.6 (*) 12.0 - 15.0 g/dL   HCT 16.1 (*) 09.6 - 04.5 %   MCV 77.4 (*) 78.0 - 100.0 fL   MCH 24.5 (*) 26.0 - 34.0 pg   MCHC 31.6  30.0 - 36.0 g/dL   RDW 40.9 (*) 81.1 - 91.4 %   Platelets 209  150 - 400 K/uL   Neutrophils Relative % 73  43 - 77 %   Neutro Abs 6.3  1.7 - 7.7 K/uL   Lymphocytes Relative  21  12 - 46 %   Lymphs Abs 1.8  0.7 - 4.0 K/uL   Monocytes Relative 6  3 - 12 %   Monocytes Absolute 0.5  0.1 - 1.0 K/uL   Eosinophils Relative 0  0 - 5 %   Eosinophils Absolute 0.0  0.0 - 0.7 K/uL   Basophils Relative 0  0 - 1 %   Basophils Absolute 0.0  0.0 - 0.1 K/uL   Smear Review Criteria for review not met    SEDIMENTATION RATE   Collection Time    11/07/12  3:20 PM      Result Value Range   Sed Rate 4  0 - 22 mm/hr  Results for orders placed during the hospital encounter of 07/30/12 (from the past 8736 hour(s))  BASIC METABOLIC PANEL   Collection Time    07/30/12 11:40 AM      Result Value Range   Sodium 140  135 - 145 mEq/L   Potassium 3.1 (*) 3.5 - 5.1 mEq/L   Chloride 101  96 - 112 mEq/L   CO2 31  19 - 32 mEq/L   Glucose, Bld 109 (*) 70 - 99 mg/dL   BUN 5 (*) 6 - 23 mg/dL   Creatinine, Ser 7.82  0.50 - 1.10 mg/dL   Calcium 9.1  8.4 - 95.6 mg/dL   GFR calc non Af Amer 89 (*) >90 mL/min   GFR calc Af Amer >90  >90 mL/min  CBC WITH DIFFERENTIAL   Collection Time    07/30/12 11:40 AM      Result Value Range   WBC 7.4  4.0 - 10.5 K/uL   RBC 4.21  3.87 - 5.11 MIL/uL   Hemoglobin 11.5 (*) 12.0 - 15.0 g/dL   HCT 21.3  08.6 -  46.0 %   MCV 88.8  78.0 - 100.0 fL   MCH 27.3  26.0 - 34.0 pg   MCHC 30.7  30.0 - 36.0 g/dL   RDW 63.8  75.6 - 43.3 %   Platelets 174  150 - 400 K/uL   Neutrophils Relative % 63  43 - 77 %   Neutro Abs 4.6  1.7 - 7.7 K/uL   Lymphocytes Relative 26  12 - 46 %   Lymphs Abs 1.9  0.7 - 4.0 K/uL   Monocytes Relative 9  3 - 12 %   Monocytes Absolute 0.7  0.1 - 1.0 K/uL   Eosinophils Relative 2  0 - 5 %   Eosinophils Absolute 0.1  0.0 - 0.7 K/uL   Basophils Relative 0  0 - 1 %   Basophils Absolute 0.0  0.0 - 0.1 K/uL  C-REACTIVE PROTEIN   Collection Time    07/30/12 11:40 AM      Result Value Range   CRP 2.6 (*) <0.60 mg/dL  SEDIMENTATION RATE   Collection Time    07/30/12 11:40 AM      Result Value Range   Sed Rate 39 (*) 0 - 22 mm/hr   VANCOMYCIN, TROUGH   Collection Time    07/31/12  4:50 AM      Result Value Range   Vancomycin Tr 28.9 (*) 10.0 - 20.0 ug/mL  MRSA PCR SCREENING   Collection Time    07/31/12 11:57 AM      Result Value Range   MRSA by PCR NEGATIVE  NEGATIVE  VANCOMYCIN, TROUGH   Collection Time    08/01/12  8:38 AM      Result Value Range   Vancomycin Tr 17.3  10.0 - 20.0 ug/mL  BASIC METABOLIC PANEL   Collection Time    08/02/12  4:45 AM      Result Value Range   Sodium 142  135 - 145 mEq/L   Potassium 4.3  3.5 - 5.1 mEq/L   Chloride 105  96 - 112 mEq/L   CO2 30  19 - 32 mEq/L   Glucose, Bld 97  70 - 99 mg/dL   BUN 7  6 - 23 mg/dL   Creatinine, Ser 2.95  0.50 - 1.10 mg/dL   Calcium 9.1  8.4 - 18.8 mg/dL   GFR calc non Af Amer >90  >90 mL/min   GFR calc Af Amer >90  >90 mL/min  CBC   Collection Time    08/02/12  4:45 AM      Result Value Range   WBC 5.0  4.0 - 10.5 K/uL   RBC 3.75 (*) 3.87 - 5.11 MIL/uL   Hemoglobin 10.2 (*) 12.0 - 15.0 g/dL   HCT 41.6 (*) 60.6 - 30.1 %   MCV 87.7  78.0 - 100.0 fL   MCH 27.2  26.0 - 34.0 pg   MCHC 31.0  30.0 - 36.0 g/dL   RDW 60.1  09.3 - 23.5 %   Platelets 181  150 - 400 K/uL  Results for orders placed during the hospital encounter of 07/01/12 (from the past 8736 hour(s))  CBC   Collection Time    07/02/12  5:31 AM      Result Value Range   WBC 6.1  4.0 - 10.5 K/uL   RBC 3.55 (*) 3.87 - 5.11 MIL/uL   Hemoglobin 10.1 (*) 12.0 - 15.0 g/dL   HCT 57.3 (*) 22.0 - 25.4 %   MCV 91.0  78.0 - 100.0 fL   MCH 28.5  26.0 - 34.0 pg   MCHC 31.3  30.0 - 36.0 g/dL   RDW 91.4  78.2 - 95.6 %   Platelets 139 (*) 150 - 400 K/uL  BASIC METABOLIC PANEL   Collection Time    07/02/12  5:31 AM      Result Value Range   Sodium 141  135 - 145 mEq/L   Potassium 4.4  3.5 - 5.1 mEq/L   Chloride 106  96 - 112 mEq/L   CO2 31  19 - 32 mEq/L   Glucose, Bld 103 (*) 70 - 99 mg/dL   BUN 8  6 - 23 mg/dL   Creatinine, Ser 2.13  0.50 - 1.10 mg/dL   Calcium 7.6 (*) 8.4 -  10.5 mg/dL   GFR calc non Af Amer 89 (*) >90 mL/min   GFR calc Af Amer >90  >90 mL/min  CBC   Collection Time    07/03/12  4:52 AM      Result Value Range   WBC 6.0  4.0 - 10.5 K/uL   RBC 3.71 (*) 3.87 - 5.11 MIL/uL   Hemoglobin 10.4 (*) 12.0 - 15.0 g/dL   HCT 08.6 (*) 57.8 - 46.9 %   MCV 89.2  78.0 - 100.0 fL   MCH 28.0  26.0 - 34.0 pg   MCHC 31.4  30.0 - 36.0 g/dL   RDW 62.9  52.8 - 41.3 %   Platelets 135 (*) 150 - 400 K/uL  CBC   Collection Time    07/04/12  3:58 AM      Result Value Range   WBC 6.9  4.0 - 10.5 K/uL   RBC 3.51 (*) 3.87 - 5.11 MIL/uL   Hemoglobin 9.9 (*) 12.0 - 15.0 g/dL   HCT 24.4 (*) 01.0 - 27.2 %   MCV 89.7  78.0 - 100.0 fL   MCH 28.2  26.0 - 34.0 pg   MCHC 31.4  30.0 - 36.0 g/dL   RDW 53.6  64.4 - 03.4 %   Platelets 138 (*) 150 - 400 K/uL  Results for orders placed during the hospital encounter of 06/25/12 (from the past 8736 hour(s))  SURGICAL PCR SCREEN   Collection Time    06/25/12 11:40 AM      Result Value Range   MRSA, PCR POSITIVE (*) NEGATIVE   Staphylococcus aureus POSITIVE (*) NEGATIVE  APTT   Collection Time    06/25/12 11:40 AM      Result Value Range   aPTT 34  24 - 37 seconds  BASIC METABOLIC PANEL   Collection Time    06/25/12 11:40 AM      Result Value Range   Sodium 142  135 - 145 mEq/L   Potassium 3.8  3.5 - 5.1 mEq/L   Chloride 104  96 - 112 mEq/L   CO2 30  19 - 32 mEq/L   Glucose, Bld 87  70 - 99 mg/dL   BUN 13  6 - 23 mg/dL   Creatinine, Ser 7.42  0.50 - 1.10 mg/dL   Calcium 9.0  8.4 - 59.5 mg/dL   GFR calc non Af Amer 76 (*) >90 mL/min   GFR calc Af Amer 88 (*) >90 mL/min  CBC WITH DIFFERENTIAL   Collection Time    06/25/12 11:40 AM      Result Value Range   WBC 5.0  4.0 - 10.5 K/uL   RBC 4.21  3.87 - 5.11  MIL/uL   Hemoglobin 11.9 (*) 12.0 - 15.0 g/dL   HCT 16.1  09.6 - 04.5 %   MCV 86.5  78.0 - 100.0 fL   MCH 28.3  26.0 - 34.0 pg   MCHC 32.7  30.0 - 36.0 g/dL   RDW 40.9  81.1 - 91.4 %   Platelets 185  150  - 400 K/uL   Neutrophils Relative % 31 (*) 43 - 77 %   Neutro Abs 1.6 (*) 1.7 - 7.7 K/uL   Lymphocytes Relative 57 (*) 12 - 46 %   Lymphs Abs 2.9  0.7 - 4.0 K/uL   Monocytes Relative 8  3 - 12 %   Monocytes Absolute 0.4  0.1 - 1.0 K/uL   Eosinophils Relative 4  0 - 5 %   Eosinophils Absolute 0.2  0.0 - 0.7 K/uL   Basophils Relative 1  0 - 1 %   Basophils Absolute 0.1  0.0 - 0.1 K/uL  PROTIME-INR   Collection Time    06/25/12 11:40 AM      Result Value Range   Prothrombin Time 13.3  11.6 - 15.2 seconds   INR 1.02  0.00 - 1.49  PREPARE RBC (CROSSMATCH)   Collection Time    06/25/12 11:40 AM      Result Value Range   Order Confirmation ORDER PROCESSED BY BLOOD BANK    TYPE AND SCREEN   Collection Time    06/25/12 11:40 AM      Result Value Range   ABO/RH(D) O POS     Antibody Screen NEG     Sample Expiration 07/09/2012     Unit Number [id number]     Blood Component Type RED CELLS,LR     Unit division 00     Status of Unit REL FROM [facility]     Transfusion Status OK TO TRANSFUSE     Crossmatch Result Compatible     Unit Number [id number]     Blood Component Type RED CELLS,LR     Unit division 00     Status of Unit REL FROM [facility]     Transfusion Status OK TO TRANSFUSE     Crossmatch Result Compatible    ABO/RH   Collection Time    06/25/12 11:40 AM      Result Value Range   ABO/RH(D) O POS    PCP draws routine labs and nothing is emerging as of concern.  Assessment Axis I bipolar disorder NOS, benzodiazepine abuse, alcohol abuse Axis II deferred Axis III see medical history Axis IV moderate Axis V 60-65  Plan/Discussion: I took her vitals.  I reviewed CC, tobacco/med/surg Hx, meds effects/ side effects, problem list, therapies and responses as well as current situation/symptoms discussed options. Again restart Cymblata with Neurontin and appeal to [facility] to prescribe Lidoderm for her back and Voltaren gel for her knees along with an exercise program and  her getting back into singing and creating things. See orders and pt instructions for more details. MEDICATIONS this encounter: No orders of the defined types were placed in this encounter.    Medical Decision Making Problem Points:  Established problem, stable/improving (1), Established problem, worsening (2), Review of last therapy session (1) and Review of psycho-social stressors (1) Data Points:  Review or order clinical lab tests (1) Review of medication regiment & side effects (2) Review of new medications or change in dosage (2)  I certify that outpatient services furnished can reasonably be expected to improve the patient's  condition.   Orson Aloe, MD, [facility]

## 2013-02-24 ENCOUNTER — Encounter: Payer: Self-pay | Admitting: Orthopedic Surgery

## 2013-02-24 ENCOUNTER — Ambulatory Visit (INDEPENDENT_AMBULATORY_CARE_PROVIDER_SITE_OTHER): Payer: Medicare HMO | Admitting: Orthopedic Surgery

## 2013-02-24 VITALS — BP 146/84 | Ht 63.0 in | Wt 201.0 lb

## 2013-02-24 DIAGNOSIS — Z96659 Presence of unspecified artificial knee joint: Secondary | ICD-10-CM

## 2013-02-24 DIAGNOSIS — Z96651 Presence of right artificial knee joint: Secondary | ICD-10-CM

## 2013-02-24 NOTE — Progress Notes (Signed)
Patient ID: Diane [name], female   DOB: November 02, 1950, 62 y.o.   MRN: [id number] Status post revision with patelloplasty only  [facility]  Therapist wanted wound check but there is no open wound. At the distal most portion of the incision the area where the subcuticular stitch was knotted is prominent with no drainage. Patient is advised to use alcohol cover with Band-Aid and call me if this opens up and will call her in an antibiotic otherwise she'll keep her regular appointment. Her report card shows 115 of knee flexion she says her knee feels much better as 4/10 pain with ambulation she is using a cane she ganglion 200 feet she is full weightbearing everything looks good her preoperative pain has been alleviated  Followup as planned

## 2013-02-24 NOTE — Patient Instructions (Addendum)
If incision opens up call office for antibiotic, wipe with alcohol and cover with band aid

## 2013-02-26 ENCOUNTER — Telehealth (HOSPITAL_COMMUNITY): Payer: Self-pay | Admitting: Psychiatry

## 2013-02-27 NOTE — Telephone Encounter (Signed)
Encouraged pt to purchase 2 of the patches and see if they work and then I can request preauth for the ins to cover it for her.

## 2013-03-03 ENCOUNTER — Telehealth (HOSPITAL_COMMUNITY): Payer: Self-pay | Admitting: Psychiatry

## 2013-03-03 NOTE — Telephone Encounter (Signed)
Got permission verbally to talk with Terie Purser regarding how to get Lidoderm approved.  Pt gave me permission to do that. Ms Jean Rosenthal has tried all the antiinflammatory meds with her and she still seems very fond of there opiates. PA [place] plans to have the pt pay for 2 patches and give a try then probably will refer to Cecille Po for pain management.

## 2013-03-25 ENCOUNTER — Encounter: Payer: Self-pay | Admitting: Orthopedic Surgery

## 2013-03-25 ENCOUNTER — Telehealth (HOSPITAL_COMMUNITY): Payer: Self-pay | Admitting: Psychiatry

## 2013-03-25 ENCOUNTER — Ambulatory Visit (INDEPENDENT_AMBULATORY_CARE_PROVIDER_SITE_OTHER): Payer: Medicare HMO | Admitting: Orthopedic Surgery

## 2013-03-25 VITALS — BP 122/82 | Ht 63.0 in | Wt 201.0 lb

## 2013-03-25 DIAGNOSIS — Z96659 Presence of unspecified artificial knee joint: Secondary | ICD-10-CM

## 2013-03-25 DIAGNOSIS — Z96651 Presence of right artificial knee joint: Secondary | ICD-10-CM

## 2013-03-25 DIAGNOSIS — T8131XD Disruption of external operation (surgical) wound, not elsewhere classified, subsequent encounter: Secondary | ICD-10-CM

## 2013-03-25 DIAGNOSIS — Z5189 Encounter for other specified aftercare: Secondary | ICD-10-CM

## 2013-03-25 MED ORDER — OXYCODONE-ACETAMINOPHEN 5-325 MG PO TABS: 1.0000 | ORAL_TABLET | ORAL | Status: DC | PRN

## 2013-03-25 NOTE — Telephone Encounter (Signed)
See contact note 

## 2013-03-25 NOTE — Progress Notes (Signed)
Patient ID: Diane [name], female   DOB: 15-Jan-1951, 62 y.o.   MRN: [id number] Chief Complaint  Patient presents with  . Follow-up    6 week recheck on right knee. DOS 01-03-13.   INDEX PROCEDURE TKA RIGHT KNEE WAS 06/2012  BP 122/82  Ht 5\' 3"  (1.6 m)  Wt 201 lb (91.173 kg)  BMI 35.61 kg/m2  FELL YESTERDAY LANDED RIGHT KNEE OTHERWISE DOING WELL  Review of systems denies numbness tingling  Vital signs as recorded. Ambulation without cane. She is tender over the front of her knee range of motion is 0-120. Knee stable in collateral plane and sagittal plane strength normal in extension skin intact wound clean dry and healed.  Impression contusion right knee  Recommend local measures ice compression rest followup in October for one year x-ray

## 2013-03-27 ENCOUNTER — Telehealth: Payer: Self-pay | Admitting: Orthopedic Surgery

## 2013-03-27 NOTE — Addendum Note (Signed)
Addended by: Fuller Canada E on: 03/27/2013 08:00 AM   Modules accepted: Level of Service

## 2013-03-27 NOTE — Telephone Encounter (Signed)
Message copied by Vickki Hearing on Thu Mar 27, 2013  7:57 AM ------      Message from: Cammie Sickle A      Created: Wed Mar 26, 2013  5:33 PM      Regarding: Is modifier needed?       Dr Romeo Apple,      Receiving an edit re:  Diane [name] [[id number]] and DOS 03/25/13;      Should visit be a post op, or should it be [id number] w/modifier?      Thanks,      Okey Regal ------

## 2013-04-04 ENCOUNTER — Ambulatory Visit (HOSPITAL_COMMUNITY): Payer: Self-pay | Admitting: Psychiatry

## 2013-04-10 ENCOUNTER — Telehealth (HOSPITAL_COMMUNITY): Payer: Self-pay | Admitting: *Deleted

## 2013-04-10 ENCOUNTER — Ambulatory Visit (INDEPENDENT_AMBULATORY_CARE_PROVIDER_SITE_OTHER): Payer: Medicare HMO | Admitting: Psychiatry

## 2013-04-10 ENCOUNTER — Encounter (HOSPITAL_COMMUNITY): Payer: Self-pay | Admitting: Psychiatry

## 2013-04-10 ENCOUNTER — Telehealth (HOSPITAL_COMMUNITY): Payer: Self-pay | Admitting: Psychiatry

## 2013-04-10 VITALS — Wt 213.0 lb

## 2013-04-10 DIAGNOSIS — F3130 Bipolar disorder, current episode depressed, mild or moderate severity, unspecified: Secondary | ICD-10-CM

## 2013-04-10 DIAGNOSIS — F1320 Sedative, hypnotic or anxiolytic dependence, uncomplicated: Secondary | ICD-10-CM

## 2013-04-10 DIAGNOSIS — F1010 Alcohol abuse, uncomplicated: Secondary | ICD-10-CM

## 2013-04-10 DIAGNOSIS — F39 Unspecified mood [affective] disorder: Secondary | ICD-10-CM

## 2013-04-10 DIAGNOSIS — F319 Bipolar disorder, unspecified: Secondary | ICD-10-CM

## 2013-04-10 DIAGNOSIS — F411 Generalized anxiety disorder: Secondary | ICD-10-CM

## 2013-04-10 DIAGNOSIS — F5105 Insomnia due to other mental disorder: Secondary | ICD-10-CM

## 2013-04-10 DIAGNOSIS — G894 Chronic pain syndrome: Secondary | ICD-10-CM

## 2013-04-10 MED ORDER — DULOXETINE HCL 30 MG PO CPEP: ORAL_CAPSULE | ORAL | Status: DC

## 2013-04-10 MED ORDER — AMITRIPTYLINE HCL 100 MG PO TABS: 100.0000 mg | ORAL_TABLET | Freq: Every day | ORAL | Status: DC

## 2013-04-10 MED ORDER — DIVALPROEX SODIUM 250 MG PO DR TAB: 750.0000 mg | DELAYED_RELEASE_TABLET | Freq: Every day | ORAL | Status: DC

## 2013-04-10 NOTE — Telephone Encounter (Signed)
Called [phone] spoke with [facility] for prior authorization of Duloxetine 30mg . Information given and stacey states a decision will be faxed within 24hr.

## 2013-04-10 NOTE — Progress Notes (Signed)
[facility] [id number] Progress Note Diane [name] MRN: [id number] DOB: 05-Feb-1951 Age: 62 y.o.  Date: 04/10/2013  Chief Complaint  Patient presents with  . Follow-up  . Medication Refill    History of present illness Patient is 62 year old [place] American female who came for her followup appointment.   Pt reports that she is compliant with the psychotropic medications and denies any side effects.  She is seeing Dr. Orson Aloe for the pain management.  She is sleeping better with amitriptyline.  She denies any recent depressive thoughts or any panic attack.  She continues to drink on and off when she is under stress.  She admitted most of the time she stays home because of the chronic pain.  She has knee surgery this April.  She denies any recent agitation anger or mood swings but admitted sometimes getting too stressed due to her physical condition.  She denies any panic attack.  Dr. Dan Humphreys had started her on Cymbalta 20 mg 4 times a day.  Patient has seen some improvement.  She denies any side effects.  She is also taking multiple pain medication from Dr. Orson Aloe.  Patient is requesting pain medication which was given in the past by Dr. Dan Humphreys.  She was given Lidoderm patches, gabapentin and Xanax.  Current psychotropic medication Depakote 750 mg at bedtime Amitriptyline 100 mg at bedtime Cymbalta 20 mg 4 times a day  Past psychiatric history Patient has history of one psychiatric admission in [facility]. She took overdose on her pills she was also intoxicated with alcohol. Patient has also seen by psychiatrist in 2004. At that time she was seeking more benzodiazepines and pain medication.  Vitals: Wt 213 lb (96.616 kg)  BMI 37.74 kg/m2  Allergies: Allergies  Allergen Reactions  . Neomycin-Bacitracin Zn-Polymyx Other (See Comments)    Reaction: skin starts to become raw and peels off  . Penicillins Hives  . Sulfonamide Derivatives Hives and  Swelling   Medical History: Past Medical History  Diagnosis Date  . Hypertension   . Allergic rhinitis   . Anxiety   . Depression   . GERD (gastroesophageal reflux disease)   . High cholesterol   . Hypothyroid   . Chronic back pain   . Arthritis   . Immature cataract   . Sleep apnea     STOP BANG score=4  . Seizures 20 yrs ago    unknown etiology-on meds  . Headaches, cluster 3 yrs ago    last one  . Mania   . 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. PTSD (post-traumatic stress disorder)   Patient has history of hypertension, allergic rhinitis, hypothyroidism, chronic back pain, hyperlipidemia and obesity.  She most recently had surgery on her right knee.  Surgical History: Past Surgical History  Procedure Laterality Date  . Left foot  x 2  . Foot surgery      left foot-  . Partial hysterectomy    . Oophoectomy  bilateral  . Cholecystectomy    . Lumbar disc surgery  L4-5  . Back surgery    . Knee arthroscopy  01/2012    right  . Total knee arthroplasty  07/01/2012    Procedure: TOTAL KNEE ARTHROPLASTY;  Surgeon: Vickki Hearing, MD;  Location: [facility];  Service: Orthopedics;  Laterality: Right;  . Total knee revision Right 01/03/2013    Procedure: Patellaplasty Right Knee;  Surgeon: Vickki Hearing, MD;  Location: [facility];  Service: Orthopedics;  Laterality: Right;  .  Knee arthroscopy with lateral release Right 01/03/2013    Procedure: Lateral Release Patella Right Knee;  Surgeon: Vickki Hearing, MD;  Location: [facility];  Service: Orthopedics;  Laterality: Right;  . Incision and drainage Right 01/03/2013    Procedure: INCISION AND DRAINAGE;  Surgeon: Vickki Hearing, MD;  Location: [facility];  Service: Orthopedics;  Laterality: Right;   Family History: family history includes ADD / ADHD in her grandchildren; Alcohol abuse in her brothers, father, mother, and unspecified family member; Anxiety disorder in her brothers, father, and mother; Bipolar disorder in her sister;  Dementia in her paternal aunt; Diabetes in an unspecified family member; Drug abuse in her brothers; Heart disease in an unspecified family member; and Seizures in her brothers.  There is no history of Depression, and OCD, and Paranoid behavior, and Schizophrenia, and Sexual abuse, and Physical abuse, .   Psychosocial history Patient has been married for 16 years. She has 2 children. She is married however her relationship is very tense. She has history of leaving her husband in the past.  Currently she lives by herself.  Mental status examination Patient is mildly obese female who is casually dressed and fairly groomed.  She has difficulty walking due to pain.  She is cooperative and maintained fair eye contact. Her speech is clear and coherent. Her attention and concentration is fair. She denies any active or passive suicidal thinking and homicidal thinking. She's alert and oriented x3. There are no psychotic symptoms present at this time. She denies any auditory or visual hallucination. Her insight judgment is fair and her impulse control is okay.  Lab Results:  Results for orders placed during the hospital encounter of 01/03/13 (from the past 8736 hour(s))  WOUND CULTURE   Collection Time    01/03/13 10:05 AM      Result Value Range   Specimen Description KNEE RIGHT JOINT     Special Requests NONE     Gram Stain       Value: RARE WBC PRESENT, PREDOMINANTLY MONONUCLEAR     NO SQUAMOUS EPITHELIAL CELLS SEEN     NO ORGANISMS SEEN   Culture NO GROWTH 2 DAYS     Report Status 01/05/2013 FINAL    WOUND CULTURE   Collection Time    01/03/13 10:05 AM      Result Value Range   Specimen Description KNEE JOINT RIGHT     Special Requests NONE     Gram Stain       Value: RARE WBC PRESENT, PREDOMINANTLY PMN     NO SQUAMOUS EPITHELIAL CELLS SEEN     NO ORGANISMS SEEN   Culture NO GROWTH 2 DAYS     Report Status 01/05/2013 FINAL    WOUND CULTURE   Collection Time    01/03/13 10:05 AM       Result Value Range   Specimen Description KNEE RIGHT JOINT     Special Requests NONE     Gram Stain       Value: RARE WBC PRESENT,BOTH PMN AND MONONUCLEAR     NO SQUAMOUS EPITHELIAL CELLS SEEN     NO ORGANISMS SEEN   Culture NO GROWTH 2 DAYS     Report Status 01/05/2013 FINAL    ANAEROBIC CULTURE   Collection Time    01/03/13 10:05 AM      Result Value Range   Specimen Description WOUND RIGHT KNEE JOINT 1 OF 3     Special Requests NONE     Gram  Stain       Value: RARE WBC PRESENT, PREDOMINANTLY PMN     NO SQUAMOUS EPITHELIAL CELLS SEEN     NO ORGANISMS SEEN   Culture NO ANAEROBES ISOLATED     Report Status 01/08/2013 FINAL    ANAEROBIC CULTURE   Collection Time    01/03/13 10:05 AM      Result Value Range   Specimen Description WOUND RIGHT KNEE JOINT 2 OF 3     Special Requests NONE     Gram Stain       Value: RARE WBC PRESENT,BOTH PMN AND MONONUCLEAR     NO SQUAMOUS EPITHELIAL CELLS SEEN     NO ORGANISMS SEEN   Culture NO ANAEROBES ISOLATED     Report Status 01/08/2013 FINAL    ANAEROBIC CULTURE   Collection Time    01/03/13 10:05 AM      Result Value Range   Specimen Description WOUND RIGHT KNEE JOINT 3 OF 3     Special Requests NONE     Gram Stain       Value: RARE WBC PRESENT, PREDOMINANTLY MONONUCLEAR     NO SQUAMOUS EPITHELIAL CELLS SEEN     NO ORGANISMS SEEN   Culture NO ANAEROBES ISOLATED     Report Status 01/08/2013 FINAL    Results for orders placed during the hospital encounter of 12/30/12 (from the past 8736 hour(s))  SURGICAL PCR SCREEN   Collection Time    12/30/12  9:59 AM      Result Value Range   MRSA, PCR POSITIVE (*) NEGATIVE   Staphylococcus aureus POSITIVE (*) NEGATIVE  C-REACTIVE PROTEIN   Collection Time    12/30/12 10:30 AM      Result Value Range   CRP 1.6 (*) <0.60 mg/dL  SEDIMENTATION RATE   Collection Time    12/30/12 10:30 AM      Result Value Range   Sed Rate 40 (*) 0 - 22 mm/hr  APTT   Collection Time    12/30/12 10:30 AM       Result Value Range   aPTT 31  24 - 37 seconds  BASIC METABOLIC PANEL   Collection Time    12/30/12 10:30 AM      Result Value Range   Sodium 140  135 - 145 mEq/L   Potassium 3.2 (*) 3.5 - 5.1 mEq/L   Chloride 100  96 - 112 mEq/L   CO2 29  19 - 32 mEq/L   Glucose, Bld 94  70 - 99 mg/dL   BUN 19  6 - 23 mg/dL   Creatinine, Ser 1.61 (*) 0.50 - 1.10 mg/dL   Calcium 9.5  8.4 - 09.6 mg/dL   GFR calc non Af Amer 29 (*) >90 mL/min   GFR calc Af Amer 34 (*) >90 mL/min  CBC WITH DIFFERENTIAL   Collection Time    12/30/12 10:30 AM      Result Value Range   WBC 6.7  4.0 - 10.5 K/uL   RBC 5.11  3.87 - 5.11 MIL/uL   Hemoglobin 13.5  12.0 - 15.0 g/dL   HCT 04.5  40.9 - 81.1 %   MCV 81.2  78.0 - 100.0 fL   MCH 26.4  26.0 - 34.0 pg   MCHC 32.5  30.0 - 36.0 g/dL   RDW 91.4 (*) 78.2 - 95.6 %   Platelets 212  150 - 400 K/uL   Neutrophils Relative % 51  43 - 77 %   Neutro  Abs 3.4  1.7 - 7.7 K/uL   Lymphocytes Relative 39  12 - 46 %   Lymphs Abs 2.7  0.7 - 4.0 K/uL   Monocytes Relative 9  3 - 12 %   Monocytes Absolute 0.6  0.1 - 1.0 K/uL   Eosinophils Relative 0  0 - 5 %   Eosinophils Absolute 0.0  0.0 - 0.7 K/uL   Basophils Relative 0  0 - 1 %   Basophils Absolute 0.0  0.0 - 0.1 K/uL  PROTIME-INR   Collection Time    12/30/12 10:30 AM      Result Value Range   Prothrombin Time 13.4  11.6 - 15.2 seconds   INR 1.03  0.00 - 1.49  TYPE AND SCREEN   Collection Time    12/30/12 10:30 AM      Result Value Range   ABO/RH(D) O POS     Antibody Screen NEG     Sample Expiration 01/02/2013    Results for orders placed in visit on 11/04/12 (from the past 8736 hour(s))  CBC WITH DIFFERENTIAL   Collection Time    11/07/12  3:20 PM      Result Value Range   WBC 8.6  4.0 - 10.5 K/uL   RBC 4.33  3.87 - 5.11 MIL/uL   Hemoglobin 10.6 (*) 12.0 - 15.0 g/dL   HCT 16.1 (*) 09.6 - 04.5 %   MCV 77.4 (*) 78.0 - 100.0 fL   MCH 24.5 (*) 26.0 - 34.0 pg   MCHC 31.6  30.0 - 36.0 g/dL   RDW 40.9 (*)  81.1 - 15.5 %   Platelets 209  150 - 400 K/uL   Neutrophils Relative % 73  43 - 77 %   Neutro Abs 6.3  1.7 - 7.7 K/uL   Lymphocytes Relative 21  12 - 46 %   Lymphs Abs 1.8  0.7 - 4.0 K/uL   Monocytes Relative 6  3 - 12 %   Monocytes Absolute 0.5  0.1 - 1.0 K/uL   Eosinophils Relative 0  0 - 5 %   Eosinophils Absolute 0.0  0.0 - 0.7 K/uL   Basophils Relative 0  0 - 1 %   Basophils Absolute 0.0  0.0 - 0.1 K/uL   Smear Review Criteria for review not met    SEDIMENTATION RATE   Collection Time    11/07/12  3:20 PM      Result Value Range   Sed Rate 4  0 - 22 mm/hr  Results for orders placed during the hospital encounter of 07/30/12 (from the past 8736 hour(s))  BASIC METABOLIC PANEL   Collection Time    07/30/12 11:40 AM      Result Value Range   Sodium 140  135 - 145 mEq/L   Potassium 3.1 (*) 3.5 - 5.1 mEq/L   Chloride 101  96 - 112 mEq/L   CO2 31  19 - 32 mEq/L   Glucose, Bld 109 (*) 70 - 99 mg/dL   BUN 5 (*) 6 - 23 mg/dL   Creatinine, Ser 9.14  0.50 - 1.10 mg/dL   Calcium 9.1  8.4 - 78.2 mg/dL   GFR calc non Af Amer 89 (*) >90 mL/min   GFR calc Af Amer >90  >90 mL/min  CBC WITH DIFFERENTIAL   Collection Time    07/30/12 11:40 AM      Result Value Range   WBC 7.4  4.0 - 10.5 K/uL   RBC 4.21  3.87 - 5.11 MIL/uL   Hemoglobin 11.5 (*) 12.0 - 15.0 g/dL   HCT 16.1  09.6 - 04.5 %   MCV 88.8  78.0 - 100.0 fL   MCH 27.3  26.0 - 34.0 pg   MCHC 30.7  30.0 - 36.0 g/dL   RDW 40.9  81.1 - 91.4 %   Platelets 174  150 - 400 K/uL   Neutrophils Relative % 63  43 - 77 %   Neutro Abs 4.6  1.7 - 7.7 K/uL   Lymphocytes Relative 26  12 - 46 %   Lymphs Abs 1.9  0.7 - 4.0 K/uL   Monocytes Relative 9  3 - 12 %   Monocytes Absolute 0.7  0.1 - 1.0 K/uL   Eosinophils Relative 2  0 - 5 %   Eosinophils Absolute 0.1  0.0 - 0.7 K/uL   Basophils Relative 0  0 - 1 %   Basophils Absolute 0.0  0.0 - 0.1 K/uL  C-REACTIVE PROTEIN   Collection Time    07/30/12 11:40 AM      Result Value Range    CRP 2.6 (*) <0.60 mg/dL  SEDIMENTATION RATE   Collection Time    07/30/12 11:40 AM      Result Value Range   Sed Rate 39 (*) 0 - 22 mm/hr  VANCOMYCIN, TROUGH   Collection Time    07/31/12  4:50 AM      Result Value Range   Vancomycin Tr 28.9 (*) 10.0 - 20.0 ug/mL  MRSA PCR SCREENING   Collection Time    07/31/12 11:57 AM      Result Value Range   MRSA by PCR NEGATIVE  NEGATIVE  VANCOMYCIN, TROUGH   Collection Time    08/01/12  8:38 AM      Result Value Range   Vancomycin Tr 17.3  10.0 - 20.0 ug/mL  BASIC METABOLIC PANEL   Collection Time    08/02/12  4:45 AM      Result Value Range   Sodium 142  135 - 145 mEq/L   Potassium 4.3  3.5 - 5.1 mEq/L   Chloride 105  96 - 112 mEq/L   CO2 30  19 - 32 mEq/L   Glucose, Bld 97  70 - 99 mg/dL   BUN 7  6 - 23 mg/dL   Creatinine, Ser 7.82  0.50 - 1.10 mg/dL   Calcium 9.1  8.4 - 95.6 mg/dL   GFR calc non Af Amer >90  >90 mL/min   GFR calc Af Amer >90  >90 mL/min  CBC   Collection Time    08/02/12  4:45 AM      Result Value Range   WBC 5.0  4.0 - 10.5 K/uL   RBC 3.75 (*) 3.87 - 5.11 MIL/uL   Hemoglobin 10.2 (*) 12.0 - 15.0 g/dL   HCT 21.3 (*) 08.6 - 57.8 %   MCV 87.7  78.0 - 100.0 fL   MCH 27.2  26.0 - 34.0 pg   MCHC 31.0  30.0 - 36.0 g/dL   RDW 46.9  62.9 - 52.8 %   Platelets 181  150 - 400 K/uL  Results for orders placed during the hospital encounter of 07/01/12 (from the past 8736 hour(s))  CBC   Collection Time    07/02/12  5:31 AM      Result Value Range   WBC 6.1  4.0 - 10.5 K/uL   RBC 3.55 (*) 3.87 - 5.11 MIL/uL   Hemoglobin  10.1 (*) 12.0 - 15.0 g/dL   HCT 16.1 (*) 09.6 - 04.5 %   MCV 91.0  78.0 - 100.0 fL   MCH 28.5  26.0 - 34.0 pg   MCHC 31.3  30.0 - 36.0 g/dL   RDW 40.9  81.1 - 91.4 %   Platelets 139 (*) 150 - 400 K/uL  BASIC METABOLIC PANEL   Collection Time    07/02/12  5:31 AM      Result Value Range   Sodium 141  135 - 145 mEq/L   Potassium 4.4  3.5 - 5.1 mEq/L   Chloride 106  96 - 112 mEq/L   CO2 31   19 - 32 mEq/L   Glucose, Bld 103 (*) 70 - 99 mg/dL   BUN 8  6 - 23 mg/dL   Creatinine, Ser 7.82  0.50 - 1.10 mg/dL   Calcium 7.6 (*) 8.4 - 10.5 mg/dL   GFR calc non Af Amer 89 (*) >90 mL/min   GFR calc Af Amer >90  >90 mL/min  CBC   Collection Time    07/03/12  4:52 AM      Result Value Range   WBC 6.0  4.0 - 10.5 K/uL   RBC 3.71 (*) 3.87 - 5.11 MIL/uL   Hemoglobin 10.4 (*) 12.0 - 15.0 g/dL   HCT 95.6 (*) 21.3 - 08.6 %   MCV 89.2  78.0 - 100.0 fL   MCH 28.0  26.0 - 34.0 pg   MCHC 31.4  30.0 - 36.0 g/dL   RDW 57.8  46.9 - 62.9 %   Platelets 135 (*) 150 - 400 K/uL  CBC   Collection Time    07/04/12  3:58 AM      Result Value Range   WBC 6.9  4.0 - 10.5 K/uL   RBC 3.51 (*) 3.87 - 5.11 MIL/uL   Hemoglobin 9.9 (*) 12.0 - 15.0 g/dL   HCT 52.8 (*) 41.3 - 24.4 %   MCV 89.7  78.0 - 100.0 fL   MCH 28.2  26.0 - 34.0 pg   MCHC 31.4  30.0 - 36.0 g/dL   RDW 01.0  27.2 - 53.6 %   Platelets 138 (*) 150 - 400 K/uL  Results for orders placed during the hospital encounter of 06/25/12 (from the past 8736 hour(s))  SURGICAL PCR SCREEN   Collection Time    06/25/12 11:40 AM      Result Value Range   MRSA, PCR POSITIVE (*) NEGATIVE   Staphylococcus aureus POSITIVE (*) NEGATIVE  APTT   Collection Time    06/25/12 11:40 AM      Result Value Range   aPTT 34  24 - 37 seconds  BASIC METABOLIC PANEL   Collection Time    06/25/12 11:40 AM      Result Value Range   Sodium 142  135 - 145 mEq/L   Potassium 3.8  3.5 - 5.1 mEq/L   Chloride 104  96 - 112 mEq/L   CO2 30  19 - 32 mEq/L   Glucose, Bld 87  70 - 99 mg/dL   BUN 13  6 - 23 mg/dL   Creatinine, Ser 6.44  0.50 - 1.10 mg/dL   Calcium 9.0  8.4 - 03.4 mg/dL   GFR calc non Af Amer 76 (*) >90 mL/min   GFR calc Af Amer 88 (*) >90 mL/min  CBC WITH DIFFERENTIAL   Collection Time    06/25/12 11:40 AM  Result Value Range   WBC 5.0  4.0 - 10.5 K/uL   RBC 4.21  3.87 - 5.11 MIL/uL   Hemoglobin 11.9 (*) 12.0 - 15.0 g/dL   HCT 16.1  09.6 -  04.5 %   MCV 86.5  78.0 - 100.0 fL   MCH 28.3  26.0 - 34.0 pg   MCHC 32.7  30.0 - 36.0 g/dL   RDW 40.9  81.1 - 91.4 %   Platelets 185  150 - 400 K/uL   Neutrophils Relative % 31 (*) 43 - 77 %   Neutro Abs 1.6 (*) 1.7 - 7.7 K/uL   Lymphocytes Relative 57 (*) 12 - 46 %   Lymphs Abs 2.9  0.7 - 4.0 K/uL   Monocytes Relative 8  3 - 12 %   Monocytes Absolute 0.4  0.1 - 1.0 K/uL   Eosinophils Relative 4  0 - 5 %   Eosinophils Absolute 0.2  0.0 - 0.7 K/uL   Basophils Relative 1  0 - 1 %   Basophils Absolute 0.1  0.0 - 0.1 K/uL  PROTIME-INR   Collection Time    06/25/12 11:40 AM      Result Value Range   Prothrombin Time 13.3  11.6 - 15.2 seconds   INR 1.02  0.00 - 1.49  PREPARE RBC (CROSSMATCH)   Collection Time    06/25/12 11:40 AM      Result Value Range   Order Confirmation ORDER PROCESSED BY BLOOD BANK    TYPE AND SCREEN   Collection Time    06/25/12 11:40 AM      Result Value Range   ABO/RH(D) O POS     Antibody Screen NEG     Sample Expiration 07/09/2012     Unit Number [id number]     Blood Component Type RED CELLS,LR     Unit division 00     Status of Unit REL FROM [facility]     Transfusion Status OK TO TRANSFUSE     Crossmatch Result Compatible     Unit Number [id number]     Blood Component Type RED CELLS,LR     Unit division 00     Status of Unit REL FROM [facility]     Transfusion Status OK TO TRANSFUSE     Crossmatch Result Compatible    ABO/RH   Collection Time    06/25/12 11:40 AM      Result Value Range   ABO/RH(D) O POS     Assessment Axis I bipolar disorder NOS, benzodiazepine abuse, alcohol abuse Axis II deferred Axis III see medical history Axis IV moderate Axis V 60-65  Plan/Discussion: I had a long discussion with the patient.  I explained that pain medicine cannot be given from this office.  She needs to contact her pain doctor for the pain management.  In the past we had never given Xanax and she was getting it from primary care physician.  I  reviewed her chart.  We are giving low dose of Dilantin however does has been increased by Dr. Dan Humphreys to relieve the pain.  I recommend that she should see a pain management far gabapentin, Lidoderm patches and muscle relaxants.  Due to the continued use of drinking we will not provide any more benzodiazepine.  In the past she has given benzodiazepines from her primary care physician.  We will provide only Cymbalta , Depakote and amitriptyline.  However I will increase Cymbalta dose to 90 mg a day.  This will avoid  polypharmacy since she is taking 20 mg 4 times a day.  At this time it does not any side effects of medication.  I recommended to call us back if she has any questions or concerns.  Followup in 2 months. Time spent 25 minutes.  More than 50% of the time spent in psychoeducation, counseling and coordination of care.  Discuss safety plan that anytime having active suicidal thoughts or homicidal thoughts then patient need to call 911 or go to the local emergency room.  MEDICATIONS this encounter: Meds ordered this encounter  Medications  . DULoxetine (CYMBALTA) 30 MG capsule    Sig: Take 1 in am and 2 at bed time    Dispense:  90 capsule    Refill:  1  . divalproex (DEPAKOTE) 250 MG DR tablet    Sig: Take 3 tablets (750 mg total) by mouth at bedtime.    Dispense:  90 tablet    Refill:  1  . amitriptyline (ELAVIL) 100 MG tablet    Sig: Take 1 tablet (100 mg total) by mouth at bedtime.    Dispense:  30 tablet    Refill:  1    Medical Decision Making Problem Points:  Established problem, stable/improving (1), Established problem, worsening (2), New problem, with no additional work-up planned (3), Review of last therapy session (1) and Review of psycho-social stressors (1) Data Points:  Review or order clinical lab tests (1) Review and summation of old records (2) Review of medication regiment & side effects (2) Review of new medications or change in dosage (2)  ARFEEN,SYED T., MD

## 2013-04-10 NOTE — Telephone Encounter (Signed)
Called,decision will be faxed

## 2013-04-11 ENCOUNTER — Telehealth (HOSPITAL_COMMUNITY): Payer: Self-pay

## 2013-05-05 ENCOUNTER — Telehealth: Payer: Self-pay | Admitting: Orthopedic Surgery

## 2013-05-05 NOTE — Telephone Encounter (Signed)
Spoke with patient regarding the swelling issue, and she advised me that she had been in [place], and had been up on it walking a lot. I advised her to continue to elevate and she could try wearing her compression stockings. I told her if she did not get relief after she tried that,  to call us back to make appointment.

## 2013-05-05 NOTE — Telephone Encounter (Signed)
Routing to Dr. Romeo Apple for refill request.

## 2013-05-05 NOTE — Telephone Encounter (Signed)
Diane [name] is asking for a prescription for Percocet.  Says  She has been out of town for a few days and both legs are swollen and painful.  She can be reached at [phone]

## 2013-05-06 ENCOUNTER — Other Ambulatory Visit: Payer: Self-pay | Admitting: Orthopedic Surgery

## 2013-05-06 DIAGNOSIS — Z96651 Presence of right artificial knee joint: Secondary | ICD-10-CM

## 2013-05-06 MED ORDER — OXYCODONE-ACETAMINOPHEN 5-325 MG PO TABS: 1.0000 | ORAL_TABLET | ORAL | Status: DC | PRN

## 2013-05-06 NOTE — Telephone Encounter (Signed)
Call PMD about swelling   Pick up prescription

## 2013-05-06 NOTE — Telephone Encounter (Signed)
Patient called back, gave her Dr. Mort Sawyers reply

## 2013-06-02 ENCOUNTER — Other Ambulatory Visit (HOSPITAL_COMMUNITY): Payer: Self-pay | Admitting: Family Medicine

## 2013-06-02 ENCOUNTER — Ambulatory Visit (HOSPITAL_COMMUNITY)
Admission: RE | Admit: 2013-06-02 | Discharge: 2013-06-02 | Disposition: A | Discharge status: Home / Self Care | Payer: Medicare HMO | Source: Ambulatory Visit | Attending: Family Medicine | Admitting: Family Medicine

## 2013-06-02 DIAGNOSIS — M7660 Achilles tendinitis, unspecified leg: Secondary | ICD-10-CM

## 2013-06-02 DIAGNOSIS — M7730 Calcaneal spur, unspecified foot: Secondary | ICD-10-CM | POA: Insufficient documentation

## 2013-06-05 ENCOUNTER — Other Ambulatory Visit: Payer: Self-pay | Admitting: Orthopedic Surgery

## 2013-06-05 ENCOUNTER — Telehealth: Payer: Self-pay | Admitting: Orthopedic Surgery

## 2013-06-05 DIAGNOSIS — Z96651 Presence of right artificial knee joint: Secondary | ICD-10-CM

## 2013-06-05 MED ORDER — OXYCODONE-ACETAMINOPHEN 5-325 MG PO TABS: 1.0000 | ORAL_TABLET | ORAL | Status: DC | PRN

## 2013-06-05 NOTE — Telephone Encounter (Signed)
Diane [name] wants a new Percocet prescription. Her # [phone]

## 2013-06-05 NOTE — Telephone Encounter (Signed)
Patient aware to pickup prescription 

## 2013-06-11 ENCOUNTER — Encounter: Payer: Self-pay | Admitting: Gastroenterology

## 2013-06-19 ENCOUNTER — Telehealth: Payer: Self-pay | Admitting: Orthopedic Surgery

## 2013-06-19 NOTE — Telephone Encounter (Signed)
Received call from patient, requests 2 things :  ( #1) appointment for new problem, heel spur;   -states has seen her primary care physician at [facility] and that they would need to refer to someone who participates with her insurance, [employer], which we do as part of [place].    - okay to schedule for heel spur?    (#2) can patient get a total knee implant card        Patient's phone # [phone].

## 2013-06-19 NOTE — Telephone Encounter (Signed)
Called back to patient to relay, left voice message to return call.

## 2013-06-19 NOTE — Telephone Encounter (Signed)
Yes/yes 

## 2013-07-01 ENCOUNTER — Ambulatory Visit (INDEPENDENT_AMBULATORY_CARE_PROVIDER_SITE_OTHER): Payer: Medicare HMO

## 2013-07-01 ENCOUNTER — Encounter: Payer: Self-pay | Admitting: Orthopedic Surgery

## 2013-07-01 ENCOUNTER — Ambulatory Visit (INDEPENDENT_AMBULATORY_CARE_PROVIDER_SITE_OTHER): Payer: Medicare HMO | Admitting: Orthopedic Surgery

## 2013-07-01 VITALS — BP 115/46 | Ht 63.0 in | Wt 214.0 lb

## 2013-07-01 DIAGNOSIS — Z96659 Presence of unspecified artificial knee joint: Secondary | ICD-10-CM

## 2013-07-01 DIAGNOSIS — Z96651 Presence of right artificial knee joint: Secondary | ICD-10-CM

## 2013-07-01 MED ORDER — OXYCODONE-ACETAMINOPHEN 5-325 MG PO TABS: 1.0000 | ORAL_TABLET | ORAL | Status: DC | PRN

## 2013-07-01 NOTE — Progress Notes (Signed)
Patient ID: Diane [name], female   DOB: 07-03-1951, 62 y.o.   MRN: [id number]  Chief Complaint  Patient presents with  . Follow-up    Yearly recheck right knee TKA     The patient continues complaining of pain in the lateral aspect of her right knee and leg with back pain and radicular symptoms down into the right foot unrelieved by Percocet unrelieved by gabapentin  She denies any bowel or bladder problems  She has tenderness down her leg down into the lateral anterior compartment dorsum of the foot Is tenderness around the patellar tendon of her knee Her knee flexion is 110 her extension is full she can do a straight leg raise she doesn't have extensor lag she ambulates with a cane Blood pressure 115/46, height 5\' 3"  (1.6 m), weight 214 lb (97.07 kg). General appearance is normal, the patient is alert and oriented x3 with normal mood and affect.   Assessment stable total knee x-rays look good  Back pain with radicular symptoms  Refill for Percocet  When she returns to evaluate her right foot for a spur x-ray her lumbar spine I did review her previous MRI which showed left-sided disc protrusion and she had subsequent surgery

## 2013-07-02 ENCOUNTER — Encounter (HOSPITAL_COMMUNITY): Payer: Self-pay | Admitting: Pharmacy Technician

## 2013-07-02 ENCOUNTER — Encounter: Payer: Self-pay | Admitting: Gastroenterology

## 2013-07-02 ENCOUNTER — Ambulatory Visit (INDEPENDENT_AMBULATORY_CARE_PROVIDER_SITE_OTHER): Payer: Medicare HMO | Admitting: Gastroenterology

## 2013-07-02 VITALS — BP 124/82 | HR 75 | Temp 97.7°F | Ht 63.0 in | Wt 217.6 lb

## 2013-07-02 DIAGNOSIS — R1013 Epigastric pain: Secondary | ICD-10-CM

## 2013-07-02 DIAGNOSIS — R1314 Dysphagia, pharyngoesophageal phase: Secondary | ICD-10-CM

## 2013-07-02 DIAGNOSIS — R1319 Other dysphagia: Secondary | ICD-10-CM | POA: Insufficient documentation

## 2013-07-02 DIAGNOSIS — R1310 Dysphagia, unspecified: Secondary | ICD-10-CM | POA: Insufficient documentation

## 2013-07-02 DIAGNOSIS — K219 Gastro-esophageal reflux disease without esophagitis: Secondary | ICD-10-CM

## 2013-07-02 NOTE — Patient Instructions (Signed)
1. We have scheduled you for an upper endoscopy with Dr. Rourk. Please see separate instructions. 

## 2013-07-02 NOTE — Progress Notes (Signed)
cc'd to pcp 

## 2013-07-02 NOTE — Progress Notes (Signed)
Primary Care Physician:  Pershing Proud  Primary Gastroenterologist:  Roetta Sessions, MD   Chief Complaint  Patient presents with  . Dysphagia    HPI:  Diane [name] is a 62 y.o. female here for further evaluation of couple month history of difficulty swallowing pills, solids. After swallowing food she feels like it is stuck in the upper chest area. At times when she tries to wash it down everything comes back up. Happens with all solid foods. Has difficulty swallowing her pills at times noting that she has to drink plenty of liquids to get them down. Prior dilatations have helped. Just a little heartburn. No abdominal pain. BM ok with occasional constipation. No melena, brbpr.   Current Outpatient Prescriptions  Medication Sig Dispense Refill  . ALPRAZolam (XANAX) 0.5 MG tablet Take 1 tablet (0.5 mg total) by mouth 2 (two) times daily. For anxiety  60 tablet  1  . amitriptyline (ELAVIL) 100 MG tablet Take 1 tablet (100 mg total) by mouth at bedtime.  30 tablet  1  . cloNIDine (CATAPRES) 0.2 MG tablet Take 0.2 mg by mouth at bedtime.       . cyclobenzaprine (FLEXERIL) 10 MG tablet Take 1 tablet (10 mg total) by mouth 3 (three) times daily. For spasms  60 tablet  5  . divalproex (DEPAKOTE) 250 MG DR tablet Take 3 tablets (750 mg total) by mouth at bedtime.  90 tablet  1  . DULoxetine (CYMBALTA) 30 MG capsule Take 1 in am and 2 at bed time  90 capsule  1  . gabapentin (NEURONTIN) 300 MG capsule Take 2-3 capsules (600-900 mg total) by mouth 4 (four) times daily. Take this for anxiety and pain at the maximum tolerated dose.  360 capsule  1  . levothyroxine (LEVOTHROID) 150 MCG tablet Take 150 mcg by mouth daily.       Marland Kitchen loratadine (CLARITIN) 10 MG tablet Take 10 mg by mouth daily.       . Multiple Vitamin (MULTIVITAMIN WITH MINERALS) TABS Take 1 tablet by mouth daily.      Marland Kitchen oxybutynin (DITROPAN) 5 MG tablet Take 5 mg by mouth 2 (two) times daily.       Marland Kitchen oxyCODONE-acetaminophen  (PERCOCET/ROXICET) 5-325 MG per tablet Take 1 tablet by mouth every 4 (four) hours as needed.  180 tablet  0  . rosuvastatin (CRESTOR) 40 MG tablet Take 40 mg by mouth daily.         No current facility-administered medications for this visit.    Allergies as of 07/02/2013 - Review Complete 07/02/2013  Allergen Reaction Noted  . Neomycin-bacitracin zn-polymyx Other (See Comments) 02/27/2008  . Penicillins Hives   . Sulfonamide derivatives Hives and Swelling 02/27/2008    Past Medical History  Diagnosis Date  . Hypertension   . Allergic rhinitis   . Anxiety   . Depression   . GERD (gastroesophageal reflux disease)   . High cholesterol   . Hypothyroid   . Chronic back pain   . Arthritis   . Immature cataract   . Sleep apnea     STOP BANG score=4  . Seizures 20 yrs ago    unknown etiology-on meds  . Headaches, cluster 3 yrs ago    last one  . Mania   . 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. PTSD (post-traumatic stress disorder)     Past Surgical History  Procedure Laterality Date  . Left foot  x 2  . Foot surgery  left foot-  . Partial hysterectomy    . Oophoectomy  bilateral  . Cholecystectomy    . Lumbar disc surgery  L4-5  . Back surgery    . Knee arthroscopy  01/2012    right  . Total knee arthroplasty  07/01/2012    Procedure: TOTAL KNEE ARTHROPLASTY;  Surgeon: Vickki Hearing, MD;  Location: [facility];  Service: Orthopedics;  Laterality: Right;  . Total knee revision Right 01/03/2013    Procedure: Patellaplasty Right Knee;  Surgeon: Vickki Hearing, MD;  Location: [facility];  Service: Orthopedics;  Laterality: Right;  . Knee arthroscopy with lateral release Right 01/03/2013    Procedure: Lateral Release Patella Right Knee;  Surgeon: Vickki Hearing, MD;  Location: [facility];  Service: Orthopedics;  Laterality: Right;  . Incision and drainage Right 01/03/2013    Procedure: INCISION AND DRAINAGE;  Surgeon: Vickki Hearing, MD;  Location: [facility];  Service:  Orthopedics;  Laterality: Right;  . Esophagogastroduodenoscopy  10/16/2008    WJX:BJYNW hiatal hernia otherwise normal esophagus, stomach  D1, D2, status post passage of a 56-French Maloney dilator  . Colonoscopy  10/16/2008    GNF:AOZHYQMVHQI, otherwise normal rectum; pancolonic diverticula the remainder of the colonic mucosa appeared normal. Next TCS due 10/2013.  Marland Kitchen Esophagogastroduodenoscopy  06/28/2006    ONG:EXBMWU esophagus. Small hiatal hernia. Otherwise normal stomach, D1 and D2, status post passage of a 56 [place] Maloney dilator  . Colonoscopy  06/28/2006    RMR: Internal hemorrhoids. Diminutive rectal polyps, cold biopsied/ Pancolonic diverticula. Polyps in the right colon removed     Family History  Problem Relation Age of Onset  . Heart disease    . Diabetes    . Alcohol abuse    . Alcohol abuse Mother   . Anxiety disorder Mother   . Alcohol abuse Father   . Anxiety disorder Father   . Bipolar disorder Sister   . Alcohol abuse Brother   . Anxiety disorder Brother   . Drug abuse Brother   . Dementia Paternal Aunt   . Depression Neg Hx   . OCD Neg Hx   . Paranoid behavior Neg Hx   . Schizophrenia Neg Hx   . Sexual abuse Neg Hx   . Physical abuse Neg Hx   . ADD / ADHD Grandchild   . ADD / ADHD Grandchild   . Drug abuse Brother   . Alcohol abuse Brother   . Anxiety disorder Brother   . Seizures Brother   . Alcohol abuse Brother   . Anxiety disorder Brother   . Seizures Brother   . Alcohol abuse Brother   . Anxiety disorder Brother   . Alcohol abuse Brother   . Anxiety disorder Brother     History   Social History  . Marital Status: Legally Separated    Spouse Name: N/A    Number of Children: N/A  . Years of Education: 12th grade   Occupational History  . disabled    Social History Main Topics  . Smoking status: Former Smoker -- 0.25 packs/day for 5 years    Types: Cigarettes    Quit date: 06/26/2003  . Smokeless tobacco: Never Used  . Alcohol Use:  Yes     Comment: occasionally  . Drug Use: No     Comment: has a past history of street drug use  . Sexual Activity: Yes    Birth Control/ Protection: Surgical   Other Topics Concern  . Not on file  Social History Narrative  . No narrative on file      ROS:  General: Negative for anorexia, weight loss, fever, chills, fatigue, weakness. Eyes: Negative for vision changes.  ENT: Negative for hoarseness, difficulty swallowing , nasal congestion. CV: Negative for chest pain, angina, palpitations, dyspnea on exertion, peripheral edema.  Respiratory: Negative for dyspnea at rest, dyspnea on exertion, cough, sputum, wheezing.  GI: See history of present illness. GU:  Negative for dysuria, hematuria, urinary incontinence, urinary frequency, nocturnal urination.  MS: Chronic knee pain. No low back pain.  Derm: Negative for rash or itching.  Neuro: Negative for weakness, abnormal sensation, seizure, frequent headaches, memory loss, confusion.  Psych: Negative for anxiety, depression, suicidal ideation, hallucinations.  Endo: Negative for unusual weight change.  Heme: Negative for bruising or bleeding. Allergy: Negative for rash or hives.    Physical Examination:  BP 124/82  Pulse 75  Temp(Src) 97.7 F (36.5 C) (Oral)  Ht 5\' 3"  (1.6 m)  Wt 217 lb 9.6 oz (98.703 kg)  BMI 38.56 kg/m2   General: Well-nourished, well-developed in no acute distress.  Head: Normocephalic, atraumatic.   Eyes: Conjunctiva pink, no icterus. Mouth: Oropharyngeal mucosa moist and pink , no lesions erythema or exudate. Neck: Supple without thyromegaly, masses, or lymphadenopathy.  Lungs: Clear to auscultation bilaterally.  Heart: Regular rate and rhythm, no murmurs rubs or gallops.  Abdomen: Bowel sounds are normal, mild epigastric tenderness, nondistended, no hepatosplenomegaly or masses, no abdominal bruits or    hernia , no rebound or guarding.   Rectal: Not performed Extremities: No lower extremity  edema. No clubbing or deformities.  Neuro: Alert and oriented x 4 , grossly normal neurologically.  Skin: Warm and dry, no rash or jaundice.   Psych: Alert and cooperative, normal mood and affect.

## 2013-07-02 NOTE — Assessment & Plan Note (Signed)
62 year old lady with recurrent esophageal dysphagia to solid foods and pills. Last endoscopy over 4 years ago. Other than the small hiatal hernia was normal. She does report improvement of dysphagia at that time status post dilation. Would offer her repeat endoscopy with dilation. If she has recurrent dysphagia postoperatively, she may need to have a barium swallow as next step. She also some epigastric tenderness on exam although really denied any abdominal pain upon questioning. Rare heartburn symptoms. EGD with dilation in the near future. We will augment conscious sedation with Phenergan 25 mg IV 30 minutes before the procedure. Previously she has done well with conscious sedation despite polypharmacy.  I have discussed the risks, alternatives, benefits with regards to but not limited to the risk of reaction to medication, bleeding, infection, perforation and the patient is agreeable to proceed. Written consent to be obtained.

## 2013-07-04 NOTE — Telephone Encounter (Addendum)
07/04/13 - patient called - states forgot to pick up her total knee implant card at appointment 07/01/13 - needs as soon as possible. - Dr. Romeo Apple noted "yes"  In response to original note of 06/19/13, "needs TKA implant card" - her surgery date was: 07/01/2012.  Ph # is [id number].

## 2013-07-04 NOTE — Telephone Encounter (Signed)
ok give her what she needs

## 2013-07-07 NOTE — Telephone Encounter (Signed)
Called patient, notified implant card is ready.

## 2013-07-14 ENCOUNTER — Encounter (HOSPITAL_COMMUNITY)
Admission: RE | Disposition: A | Discharge status: Home / Self Care | Payer: Self-pay | Source: Ambulatory Visit | Attending: Internal Medicine

## 2013-07-14 ENCOUNTER — Ambulatory Visit (HOSPITAL_COMMUNITY)
Admission: RE | Admit: 2013-07-14 | Discharge: 2013-07-14 | Disposition: A | Discharge status: Home / Self Care | Payer: Medicare HMO | Source: Ambulatory Visit | Attending: Internal Medicine | Admitting: Internal Medicine

## 2013-07-14 ENCOUNTER — Encounter (HOSPITAL_COMMUNITY): Payer: Self-pay | Admitting: *Deleted

## 2013-07-14 DIAGNOSIS — K219 Gastro-esophageal reflux disease without esophagitis: Secondary | ICD-10-CM

## 2013-07-14 DIAGNOSIS — I10 Essential (primary) hypertension: Secondary | ICD-10-CM | POA: Insufficient documentation

## 2013-07-14 DIAGNOSIS — K449 Diaphragmatic hernia without obstruction or gangrene: Secondary | ICD-10-CM

## 2013-07-14 DIAGNOSIS — R1319 Other dysphagia: Secondary | ICD-10-CM

## 2013-07-14 DIAGNOSIS — R933 Abnormal findings on diagnostic imaging of other parts of digestive tract: Secondary | ICD-10-CM | POA: Insufficient documentation

## 2013-07-14 DIAGNOSIS — R1310 Dysphagia, unspecified: Secondary | ICD-10-CM

## 2013-07-14 HISTORY — PX: ESOPHAGOGASTRODUODENOSCOPY (EGD) WITH ESOPHAGEAL DILATION: SHX5812

## 2013-07-14 SURGERY — ESOPHAGOGASTRODUODENOSCOPY (EGD) WITH ESOPHAGEAL DILATION
Anesthesia: Moderate Sedation

## 2013-07-14 MED ORDER — PROMETHAZINE HCL 25 MG/ML IJ SOLN
25.0000 mg | Freq: Once | INTRAMUSCULAR | Status: AC
  Administered 2013-07-14: 25 mg via INTRAVENOUS

## 2013-07-14 MED ORDER — MIDAZOLAM HCL 5 MG/5ML IJ SOLN
INTRAMUSCULAR | Status: AC
  Filled 2013-07-14: qty 10

## 2013-07-14 MED ORDER — MIDAZOLAM HCL 5 MG/5ML IJ SOLN
INTRAMUSCULAR | Status: DC | PRN
  Administered 2013-07-14: 2 mg via INTRAVENOUS

## 2013-07-14 MED ORDER — STERILE WATER FOR IRRIGATION IR SOLN
Status: DC | PRN
  Administered 2013-07-14: 13:00:00

## 2013-07-14 MED ORDER — MEPERIDINE HCL 100 MG/ML IJ SOLN
INTRAMUSCULAR | Status: AC
  Filled 2013-07-14: qty 2

## 2013-07-14 MED ORDER — SODIUM CHLORIDE 0.9 % IV SOLN
INTRAVENOUS | Status: DC
  Administered 2013-07-14: 13:00:00 via INTRAVENOUS

## 2013-07-14 MED ORDER — BUTAMBEN-TETRACAINE-BENZOCAINE 2-2-14 % EX AERO
INHALATION_SPRAY | CUTANEOUS | Status: DC | PRN
  Administered 2013-07-14: 2 via TOPICAL

## 2013-07-14 MED ORDER — ONDANSETRON HCL 4 MG/2ML IJ SOLN
INTRAMUSCULAR | Status: DC | PRN
  Administered 2013-07-14: 4 mg via INTRAMUSCULAR

## 2013-07-14 MED ORDER — SODIUM CHLORIDE 0.9 % IJ SOLN
INTRAMUSCULAR | Status: DC
Start: 2013-07-14 — End: 2013-07-14
  Filled 2013-07-14: qty 10

## 2013-07-14 MED ORDER — ONDANSETRON HCL 4 MG/2ML IJ SOLN
INTRAMUSCULAR | Status: AC
  Filled 2013-07-14: qty 2

## 2013-07-14 MED ORDER — MEPERIDINE HCL 100 MG/ML IJ SOLN
INTRAMUSCULAR | Status: DC | PRN
  Administered 2013-07-14: 50 mg

## 2013-07-14 MED ORDER — PROMETHAZINE HCL 25 MG/ML IJ SOLN
INTRAMUSCULAR | Status: DC
Start: 2013-07-14 — End: 2013-07-14
  Filled 2013-07-14: qty 1

## 2013-07-14 NOTE — H&P (View-Only) (Signed)
Primary Care Physician:  JACKSON,SAMANTHA, PA-C  Primary Gastroenterologist:  Michael Rourk, MD   Chief Complaint  Patient presents with  . Dysphagia    HPI:  Diane [name] is a 62 y.o. female here for further evaluation of couple month history of difficulty swallowing pills, solids. After swallowing food she feels like it is stuck in the upper chest area. At times when she tries to wash it down everything comes back up. Happens with all solid foods. Has difficulty swallowing her pills at times noting that she has to drink plenty of liquids to get them down. Prior dilatations have helped. Just a little heartburn. No abdominal pain. BM ok with occasional constipation. No melena, brbpr.   Current Outpatient Prescriptions  Medication Sig Dispense Refill  . ALPRAZolam (XANAX) 0.5 MG tablet Take 1 tablet (0.5 mg total) by mouth 2 (two) times daily. For anxiety  60 tablet  1  . amitriptyline (ELAVIL) 100 MG tablet Take 1 tablet (100 mg total) by mouth at bedtime.  30 tablet  1  . cloNIDine (CATAPRES) 0.2 MG tablet Take 0.2 mg by mouth at bedtime.       . cyclobenzaprine (FLEXERIL) 10 MG tablet Take 1 tablet (10 mg total) by mouth 3 (three) times daily. For spasms  60 tablet  5  . divalproex (DEPAKOTE) 250 MG DR tablet Take 3 tablets (750 mg total) by mouth at bedtime.  90 tablet  1  . DULoxetine (CYMBALTA) 30 MG capsule Take 1 in am and 2 at bed time  90 capsule  1  . gabapentin (NEURONTIN) 300 MG capsule Take 2-3 capsules (600-900 mg total) by mouth 4 (four) times daily. Take this for anxiety and pain at the maximum tolerated dose.  360 capsule  1  . levothyroxine (LEVOTHROID) 150 MCG tablet Take 150 mcg by mouth daily.       . loratadine (CLARITIN) 10 MG tablet Take 10 mg by mouth daily.       . Multiple Vitamin (MULTIVITAMIN WITH MINERALS) TABS Take 1 tablet by mouth daily.      . oxybutynin (DITROPAN) 5 MG tablet Take 5 mg by mouth 2 (two) times daily.       . oxyCODONE-acetaminophen  (PERCOCET/ROXICET) 5-325 MG per tablet Take 1 tablet by mouth every 4 (four) hours as needed.  180 tablet  0  . rosuvastatin (CRESTOR) 40 MG tablet Take 40 mg by mouth daily.         No current facility-administered medications for this visit.    Allergies as of 07/02/2013 - Review Complete 07/02/2013  Allergen Reaction Noted  . Neomycin-bacitracin zn-polymyx Other (See Comments) 02/27/2008  . Penicillins Hives   . Sulfonamide derivatives Hives and Swelling 02/27/2008    Past Medical History  Diagnosis Date  . Hypertension   . Allergic rhinitis   . Anxiety   . Depression   . GERD (gastroesophageal reflux disease)   . High cholesterol   . Hypothyroid   . Chronic back pain   . Arthritis   . Immature cataract   . Sleep apnea     STOP BANG score=4  . Seizures 20 yrs ago    unknown etiology-on meds  . Headaches, cluster 3 yrs ago    last one  . Mania   . 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. PTSD (post-traumatic stress disorder)     Past Surgical History  Procedure Laterality Date  . Left foot  x 2  . Foot surgery        left foot-  . Partial hysterectomy    . Oophoectomy  bilateral  . Cholecystectomy    . Lumbar disc surgery  L4-5  . Back surgery    . Knee arthroscopy  01/2012    right  . Total knee arthroplasty  07/01/2012    Procedure: TOTAL KNEE ARTHROPLASTY;  Surgeon: Stanley E Harrison, MD;  Location: [facility];  Service: Orthopedics;  Laterality: Right;  . Total knee revision Right 01/03/2013    Procedure: Patellaplasty Right Knee;  Surgeon: Stanley E Harrison, MD;  Location: [facility];  Service: Orthopedics;  Laterality: Right;  . Knee arthroscopy with lateral release Right 01/03/2013    Procedure: Lateral Release Patella Right Knee;  Surgeon: Stanley E Harrison, MD;  Location: [facility];  Service: Orthopedics;  Laterality: Right;  . Incision and drainage Right 01/03/2013    Procedure: INCISION AND DRAINAGE;  Surgeon: Stanley E Harrison, MD;  Location: [facility];  Service:  Orthopedics;  Laterality: Right;  . Esophagogastroduodenoscopy  10/16/2008    RMR:Small hiatal hernia otherwise normal esophagus, stomach  D1, D2, status post passage of a 56-French Maloney dilator  . Colonoscopy  10/16/2008    RMR:Hemorrhoids, otherwise normal rectum; pancolonic diverticula the remainder of the colonic mucosa appeared normal. Next TCS due 10/2013.  . Esophagogastroduodenoscopy  06/28/2006    RMR:Normal esophagus. Small hiatal hernia. Otherwise normal stomach, D1 and D2, status post passage of a 56 French Maloney dilator  . Colonoscopy  06/28/2006    RMR: Internal hemorrhoids. Diminutive rectal polyps, cold biopsied/ Pancolonic diverticula. Polyps in the right colon removed     Family History  Problem Relation Age of Onset  . Heart disease    . Diabetes    . Alcohol abuse    . Alcohol abuse Mother   . Anxiety disorder Mother   . Alcohol abuse Father   . Anxiety disorder Father   . Bipolar disorder Sister   . Alcohol abuse Brother   . Anxiety disorder Brother   . Drug abuse Brother   . Dementia Paternal Aunt   . Depression Neg Hx   . OCD Neg Hx   . Paranoid behavior Neg Hx   . Schizophrenia Neg Hx   . Sexual abuse Neg Hx   . Physical abuse Neg Hx   . ADD / ADHD Grandchild   . ADD / ADHD Grandchild   . Drug abuse Brother   . Alcohol abuse Brother   . Anxiety disorder Brother   . Seizures Brother   . Alcohol abuse Brother   . Anxiety disorder Brother   . Seizures Brother   . Alcohol abuse Brother   . Anxiety disorder Brother   . Alcohol abuse Brother   . Anxiety disorder Brother     History   Social History  . Marital Status: Legally Separated    Spouse Name: N/A    Number of Children: N/A  . Years of Education: 12th grade   Occupational History  . disabled    Social History Main Topics  . Smoking status: Former Smoker -- 0.25 packs/day for 5 years    Types: Cigarettes    Quit date: 06/26/2003  . Smokeless tobacco: Never Used  . Alcohol Use:  Yes     Comment: occasionally  . Drug Use: No     Comment: has a past history of street drug use  . Sexual Activity: Yes    Birth Control/ Protection: Surgical   Other Topics Concern  . Not on file     Social History Narrative  . No narrative on file      ROS:  General: Negative for anorexia, weight loss, fever, chills, fatigue, weakness. Eyes: Negative for vision changes.  ENT: Negative for hoarseness, difficulty swallowing , nasal congestion. CV: Negative for chest pain, angina, palpitations, dyspnea on exertion, peripheral edema.  Respiratory: Negative for dyspnea at rest, dyspnea on exertion, cough, sputum, wheezing.  GI: See history of present illness. GU:  Negative for dysuria, hematuria, urinary incontinence, urinary frequency, nocturnal urination.  MS: Chronic knee pain. No low back pain.  Derm: Negative for rash or itching.  Neuro: Negative for weakness, abnormal sensation, seizure, frequent headaches, memory loss, confusion.  Psych: Negative for anxiety, depression, suicidal ideation, hallucinations.  Endo: Negative for unusual weight change.  Heme: Negative for bruising or bleeding. Allergy: Negative for rash or hives.    Physical Examination:  BP 124/82  Pulse 75  Temp(Src) 97.7 F (36.5 C) (Oral)  Ht 5' 3" (1.6 m)  Wt 217 lb 9.6 oz (98.703 kg)  BMI 38.56 kg/m2   General: Well-nourished, well-developed in no acute distress.  Head: Normocephalic, atraumatic.   Eyes: Conjunctiva pink, no icterus. Mouth: Oropharyngeal mucosa moist and pink , no lesions erythema or exudate. Neck: Supple without thyromegaly, masses, or lymphadenopathy.  Lungs: Clear to auscultation bilaterally.  Heart: Regular rate and rhythm, no murmurs rubs or gallops.  Abdomen: Bowel sounds are normal, mild epigastric tenderness, nondistended, no hepatosplenomegaly or masses, no abdominal bruits or    hernia , no rebound or guarding.   Rectal: Not performed Extremities: No lower extremity  edema. No clubbing or deformities.  Neuro: Alert and oriented x 4 , grossly normal neurologically.  Skin: Warm and dry, no rash or jaundice.   Psych: Alert and cooperative, normal mood and affect.   

## 2013-07-14 NOTE — Op Note (Signed)
[facility] [address] [place], [address]   ENDOSCOPY PROCEDURE REPORT  PATIENT: Diane, [name]  MR#: [id number] BIRTHDATE: [birth year] , 62  yrs. old GENDER: Female ENDOSCOPIST:  R.  Roetta Sessions, MD FACP [facility] REFERRED BY:  Terie Purser, P.A. PROCEDURE DATE:  07/14/2013 PROCEDURE:     EGD with Elease Hashimoto dilator  INDICATIONS:     Recurrent esophageal dysphagia  INFORMED CONSENT:   The risks, benefits, limitations, alternatives and imponderables have been discussed.  The potential for biopsy, esophogeal dilation, etc. have also been reviewed.  Questions have been answered.  All parties agreeable.  Please see the history and physical in the medical record for more information.  MEDICATIONS:  Versed 2 mg IV and Demerol 50 mg IV. Phenergan 25 mg IV  DESCRIPTION OF PROCEDURE:   The AO-1308M ([id number])  endoscope was introduced through the mouth and advanced to the second portion of the duodenum without difficulty or limitations.  The mucosal surfaces were surveyed very carefully during advancement of the scope and upon withdrawal.  Retroflexion view of the proximal stomach and esophagogastric junction was performed.      FINDINGS:   Slightly "furrowd and ringed"-appearing tubular esophagus. Esophageal lumen appeared to be patent throughout its course. No Barrett's esophagus. No esophagitis. Stomach empty. Small hiatal hernia. Normal gastric mucosa. Patent pylorus. Normal first and second portion of the duodenum  THERAPEUTIC / DIAGNOSTIC MANEUVERS PERFORMED:  A 54 French Maloney dilator was passed to full insertion with mild to moderate resistance. A look back revealed no trauma or complication related to this maneuver. Subsequently, biopsies of the distal and mid esophagus taken for histologic study.   COMPLICATIONS:  None  IMPRESSION:   Abnormal esophagus of uncertain significance-status post esophageal biopsy. Small hiatal  hernia.  RECOMMENDATIONS:  Followup on pathology    _______________________________ R. Roetta Sessions, MD FACP [facility] eSigned:  R. Roetta Sessions, MD FACP [facility] 07/14/2013 1:26 PM     CC:

## 2013-07-14 NOTE — Interval H&P Note (Signed)
History and Physical Interval Note:  07/14/2013 12:54 PM  Diane [name]  has presented today for surgery, with the diagnosis of DYSPHAGIA AND GERD  The various methods of treatment have been discussed with the patient and family. After consideration of risks, benefits and other options for treatment, the patient has consented to  Procedure(s) with comments: ESOPHAGOGASTRODUODENOSCOPY (EGD) WITH ESOPHAGEAL DILATION (N/A) - 1:00 as a surgical intervention .  The patient's history has been reviewed, patient examined, no change in status, stable for surgery.  I have reviewed the patient's chart and labs.  Questions were answered to the patient's satisfaction.     No change EGD with Dr. dilation as appropriate.The risks, benefits, limitations, alternatives and imponderables have been reviewed with the patient. Potential for esophageal dilation, biopsy, etc. have also been reviewed.  Questions have been answered. All parties agreeable.  Eula Listen

## 2013-07-15 ENCOUNTER — Encounter: Payer: Self-pay | Admitting: Internal Medicine

## 2013-07-15 ENCOUNTER — Ambulatory Visit (INDEPENDENT_AMBULATORY_CARE_PROVIDER_SITE_OTHER): Payer: Medicare HMO | Admitting: Orthopedic Surgery

## 2013-07-15 VITALS — BP 115/63 | Ht 63.0 in | Wt 217.0 lb

## 2013-07-15 DIAGNOSIS — M7660 Achilles tendinitis, unspecified leg: Secondary | ICD-10-CM

## 2013-07-15 NOTE — Patient Instructions (Signed)
Refer to Dr. Nolen Mu for achillies spur and tendonitis right heel

## 2013-07-16 ENCOUNTER — Telehealth: Payer: Self-pay | Admitting: *Deleted

## 2013-07-16 NOTE — Telephone Encounter (Signed)
I tried to make referral for patient to see a podiatrist, however, No one that we refer to accepts [facility]. I have contacted the patient to inform her of this, and I advised her to call the Insurance company to see who they are in network with and call us back. I will make referral when I receive that information.

## 2013-07-17 ENCOUNTER — Encounter (HOSPITAL_COMMUNITY): Payer: Self-pay | Admitting: Internal Medicine

## 2013-07-19 NOTE — Progress Notes (Signed)
Patient ID: Diane [name], female   DOB: 19-Sep-1950, 62 y.o.   MRN: [id number]   The patient complains of pain in her foot. She will be sent for evaluation by podiatrist for Achilles tendon

## 2013-07-22 ENCOUNTER — Ambulatory Visit: Payer: Medicare HMO | Admitting: Orthopedic Surgery

## 2013-07-22 ENCOUNTER — Telehealth: Payer: Self-pay | Admitting: *Deleted

## 2013-07-22 NOTE — Telephone Encounter (Signed)
Patient called me back to inform me that [facility] accepts [employer]. I called their office to try to make the referral, however, the receptionist advised me that the PCP on back of her Insurance card has to make the referral. I called patient to inform her. She states she has never seen the doctor on back of the card, and that she goes to [facility] at [place]. I advised her that she would need to call her Insurance company and have that changed in order to be referred.

## 2013-07-29 ENCOUNTER — Encounter: Payer: Self-pay | Admitting: Orthopedic Surgery

## 2013-07-29 ENCOUNTER — Ambulatory Visit (INDEPENDENT_AMBULATORY_CARE_PROVIDER_SITE_OTHER): Payer: Medicare HMO

## 2013-07-29 ENCOUNTER — Ambulatory Visit (INDEPENDENT_AMBULATORY_CARE_PROVIDER_SITE_OTHER): Payer: Medicare HMO | Admitting: Orthopedic Surgery

## 2013-07-29 VITALS — BP 140/96 | Ht 63.0 in | Wt 217.0 lb

## 2013-07-29 DIAGNOSIS — F5105 Insomnia due to other mental disorder: Secondary | ICD-10-CM

## 2013-07-29 DIAGNOSIS — M48061 Spinal stenosis, lumbar region without neurogenic claudication: Secondary | ICD-10-CM

## 2013-07-29 DIAGNOSIS — Z96659 Presence of unspecified artificial knee joint: Secondary | ICD-10-CM

## 2013-07-29 DIAGNOSIS — M5126 Other intervertebral disc displacement, lumbar region: Secondary | ICD-10-CM

## 2013-07-29 DIAGNOSIS — G894 Chronic pain syndrome: Secondary | ICD-10-CM

## 2013-07-29 DIAGNOSIS — F39 Unspecified mood [affective] disorder: Secondary | ICD-10-CM

## 2013-07-29 DIAGNOSIS — F489 Nonpsychotic mental disorder, unspecified: Secondary | ICD-10-CM

## 2013-07-29 DIAGNOSIS — M549 Dorsalgia, unspecified: Secondary | ICD-10-CM

## 2013-07-29 DIAGNOSIS — Z96651 Presence of right artificial knee joint: Secondary | ICD-10-CM

## 2013-07-29 MED ORDER — OXYCODONE-ACETAMINOPHEN 5-325 MG PO TABS: 1.0000 | ORAL_TABLET | ORAL | Status: DC | PRN

## 2013-07-29 MED ORDER — AMITRIPTYLINE HCL 100 MG PO TABS: 100.0000 mg | ORAL_TABLET | Freq: Every day | ORAL | Status: DC

## 2013-07-29 MED ORDER — ALPRAZOLAM 0.5 MG PO TABS: 0.5000 mg | ORAL_TABLET | Freq: Two times a day (BID) | ORAL | Status: DC

## 2013-07-29 MED ORDER — CYCLOBENZAPRINE HCL 10 MG PO TABS: 10.0000 mg | ORAL_TABLET | Freq: Three times a day (TID) | ORAL | Status: DC

## 2013-07-29 NOTE — Patient Instructions (Signed)
MRI L SPINE THEN F/U WITH DR Jeral Fruit

## 2013-07-29 NOTE — Progress Notes (Addendum)
Patient ID: Diane [name], female   DOB: 02-03-1951, 62 y.o.   MRN: [id number]  Chief Complaint  Patient presents with  . Back Pain    Back pain, no injury    HISTORY: 62 year old female with history of previous L4-5 disc surgery presents with persistent back pain and right lower leg symptoms.  She's had a total knee on the right, most recent x-ray show stable total knee without complication. Her symptoms seem to involve the lower back right greater than left leg with radicular symptoms unrelieved by Percocet and gabapentin  She does not have bowel or bladder problems or symptoms.  Past Medical History  Diagnosis Date  . Hypertension   . Allergic rhinitis   . Anxiety   . Depression   . GERD (gastroesophageal reflux disease)   . High cholesterol   . Hypothyroid   . Chronic back pain   . Arthritis   . Immature cataract   . Sleep apnea     STOP BANG score=4  . Seizures 20 yrs ago    unknown etiology-on meds  . Headaches, cluster 3 yrs ago    last one  . Mania   . 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. PTSD (post-traumatic stress disorder)      We took an x-ray today and it shows disc disease at the previous surgical site  I'm recommending she go for MRI needed followup with the neurosurgeon who did the index procedure for evaluation and treatment.  Meds ordered this encounter  Medications  . cyclobenzaprine (FLEXERIL) 10 MG tablet    Sig: Take 1 tablet (10 mg total) by mouth 3 (three) times daily. For spasms    Dispense:  60 tablet    Refill:  5  . ALPRAZolam (XANAX) 0.5 MG tablet    Sig: Take 1 tablet (0.5 mg total) by mouth 2 (two) times daily. For anxiety    Dispense:  60 tablet    Refill:  1  . DISCONTD: oxyCODONE-acetaminophen (PERCOCET/ROXICET) 5-325 MG per tablet    Sig: Take 1 tablet by mouth every 4 (four) hours as needed.    Dispense:  180 tablet    Refill:  0  . amitriptyline (ELAVIL) 100 MG tablet    Sig: Take 1 tablet (100 mg total) by mouth at bedtime.    Dispense:  30 tablet    Refill:  1  . oxyCODONE-acetaminophen (PERCOCET/ROXICET) 5-325 MG per tablet    Sig: Take 1 tablet by mouth every 4 (four) hours as needed.    Dispense:  180 tablet    Refill:  0    Encounter Diagnoses  Name Primary?  . Back pain   . S/P total knee replacement, right   . Mood disorder   . Chronic pain syndrome   . Insomnia due to mental disorder   . Spinal stenosis of lumbar region Yes  . Recurrent herniation of lumbar disc

## 2013-07-29 NOTE — Addendum Note (Signed)
Addended by: Fuller Canada E on: 07/29/2013 03:10 PM   Modules accepted: Level of Service

## 2013-08-06 ENCOUNTER — Ambulatory Visit (HOSPITAL_COMMUNITY)
Admission: RE | Admit: 2013-08-06 | Discharge: 2013-08-06 | Disposition: A | Discharge status: Home / Self Care | Payer: Medicare HMO | Source: Ambulatory Visit | Attending: Orthopedic Surgery | Admitting: Orthopedic Surgery

## 2013-08-06 DIAGNOSIS — M545 Low back pain, unspecified: Secondary | ICD-10-CM | POA: Insufficient documentation

## 2013-08-06 DIAGNOSIS — M549 Dorsalgia, unspecified: Secondary | ICD-10-CM

## 2013-08-06 DIAGNOSIS — M5126 Other intervertebral disc displacement, lumbar region: Secondary | ICD-10-CM | POA: Insufficient documentation

## 2013-08-06 DIAGNOSIS — M48061 Spinal stenosis, lumbar region without neurogenic claudication: Secondary | ICD-10-CM

## 2013-08-18 ENCOUNTER — Telehealth: Payer: Self-pay | Admitting: Orthopedic Surgery

## 2013-08-18 NOTE — Telephone Encounter (Signed)
Diane [name] asked if you will call her with the MRI results. Her # [phone]

## 2013-08-18 NOTE — Telephone Encounter (Signed)
i ll call her the results

## 2013-08-19 ENCOUNTER — Encounter: Payer: Self-pay | Admitting: Orthopedic Surgery

## 2013-08-19 ENCOUNTER — Telehealth: Payer: Self-pay | Admitting: Orthopedic Surgery

## 2013-08-19 NOTE — Progress Notes (Signed)
Patient ID: Diane [name], female   DOB: 06-13-1951, 62 y.o.   MRN: [id number]  Mri 2014 result report  L3-4: There is some facet arthropathy and ligamentum flavum  thickening. No disc bulge or protrusion. The central canal and  foramina are open.  L4-5: Left laminotomy defect is identified. Left lateral recess  protrusion seen on the prior study appears slightly smaller. There  is some endplate spurring to the left. The central canal is open  with mild narrowing in the left lateral recess identified. Mild left  foraminal narrowing also noted. Central canal and right foramen are  open.  L5-S1: There is facet degenerative change. No disc bulge or  protrusion. The central canal and foramina are open.  IMPRESSION:  No finding to explain the patient's right lower extremity symptoms.  No change in a shallow central protrusion at T11-12 slightly  deforming the ventral cord.  No change in a disc bulge at T12-L1 mildly flattening the ventral  cord.  Left lateral recess protrusion at L4-5 seen on the prior study  appears smaller. There is mild narrowing in the left lateral recess  and foramen. Left laminotomy defect at this level is noted.

## 2013-08-19 NOTE — Telephone Encounter (Signed)
Left message

## 2013-09-01 ENCOUNTER — Telehealth: Payer: Self-pay | Admitting: Radiology

## 2013-09-08 ENCOUNTER — Other Ambulatory Visit: Payer: Self-pay | Admitting: Orthopedic Surgery

## 2013-09-08 DIAGNOSIS — G894 Chronic pain syndrome: Secondary | ICD-10-CM

## 2013-09-08 MED ORDER — OXYCODONE-ACETAMINOPHEN 5-325 MG PO TABS: 1.0000 | ORAL_TABLET | ORAL | Status: DC | PRN

## 2013-09-15 ENCOUNTER — Other Ambulatory Visit (HOSPITAL_COMMUNITY): Payer: Self-pay | Admitting: Psychiatry

## 2013-09-15 DIAGNOSIS — F3130 Bipolar disorder, current episode depressed, mild or moderate severity, unspecified: Secondary | ICD-10-CM

## 2013-09-15 MED ORDER — DIVALPROEX SODIUM 250 MG PO DR TAB: 750.0000 mg | DELAYED_RELEASE_TABLET | Freq: Every day | ORAL | Status: DC

## 2013-09-16 NOTE — Telephone Encounter (Signed)
Medication filled, patient picked up.

## 2013-10-06 ENCOUNTER — Telehealth: Payer: Self-pay | Admitting: Orthopedic Surgery

## 2013-10-06 NOTE — Telephone Encounter (Signed)
Routing to Dr Harrison 

## 2013-10-06 NOTE — Telephone Encounter (Signed)
Donnesha Grqves wants prescriptions for :  1.  Xanax 0.5                                                               2.  Percocet 5/325

## 2013-10-07 ENCOUNTER — Other Ambulatory Visit: Payer: Self-pay | Admitting: *Deleted

## 2013-10-07 DIAGNOSIS — G894 Chronic pain syndrome: Secondary | ICD-10-CM

## 2013-10-07 MED ORDER — OXYCODONE-ACETAMINOPHEN 5-325 MG PO TABS: 1.0000 | ORAL_TABLET | ORAL | Status: DC | PRN

## 2013-10-07 NOTE — Telephone Encounter (Signed)
Percocet yes   Xanax see PMD

## 2013-10-07 NOTE — Telephone Encounter (Signed)
Prescription picked up by the patient °

## 2013-10-07 NOTE — Telephone Encounter (Signed)
Patient informed prescription was ready to be picked up, and that she needed to contact PCP for xanax prescription.

## 2013-10-16 ENCOUNTER — Ambulatory Visit (HOSPITAL_COMMUNITY): Payer: Self-pay | Admitting: Psychiatry

## 2013-10-31 ENCOUNTER — Ambulatory Visit (HOSPITAL_COMMUNITY): Payer: Self-pay | Admitting: Psychiatry

## 2013-11-10 ENCOUNTER — Other Ambulatory Visit: Payer: Self-pay | Admitting: *Deleted

## 2013-11-10 ENCOUNTER — Telehealth: Payer: Self-pay | Admitting: Orthopedic Surgery

## 2013-11-10 DIAGNOSIS — G894 Chronic pain syndrome: Secondary | ICD-10-CM

## 2013-11-10 MED ORDER — OXYCODONE-ACETAMINOPHEN 5-325 MG PO TABS: 1.0000 | ORAL_TABLET | ORAL | Status: DC | PRN

## 2013-11-10 NOTE — Telephone Encounter (Signed)
Refilled per Dr. Romeo AppleHarrison, and called patient and advised prescription was ready to be picked up.

## 2013-11-10 NOTE — Telephone Encounter (Signed)
Patient called, requests refill of pain medication: oxyCODONE-acetaminophen (PERCOCET/ROXICET) 5-325 MG per tablet - states she has a new phone number (it has been updated in system): [phone].

## 2013-11-10 NOTE — Telephone Encounter (Signed)
11/10/13 Additional note - patient's next scheduled appointment is in July 02, 2014 for yearly total knee follow up with Xrays. She is aware of appointment.

## 2013-11-10 NOTE — Telephone Encounter (Signed)
Routing to Dr Harrison 

## 2013-11-10 NOTE — Telephone Encounter (Signed)
refill 

## 2013-11-12 ENCOUNTER — Telehealth (HOSPITAL_COMMUNITY): Payer: Self-pay | Admitting: *Deleted

## 2013-11-13 ENCOUNTER — Other Ambulatory Visit (HOSPITAL_COMMUNITY): Payer: Self-pay | Admitting: Psychiatry

## 2013-11-13 DIAGNOSIS — F3130 Bipolar disorder, current episode depressed, mild or moderate severity, unspecified: Secondary | ICD-10-CM

## 2013-11-13 MED ORDER — DIVALPROEX SODIUM 250 MG PO DR TAB: 750.0000 mg | DELAYED_RELEASE_TABLET | Freq: Every day | ORAL | Status: DC

## 2013-11-28 ENCOUNTER — Ambulatory Visit (HOSPITAL_COMMUNITY): Payer: Self-pay | Admitting: Psychiatry

## 2013-12-03 ENCOUNTER — Ambulatory Visit (INDEPENDENT_AMBULATORY_CARE_PROVIDER_SITE_OTHER): Payer: Medicare HMO | Admitting: Psychiatry

## 2013-12-03 ENCOUNTER — Encounter (HOSPITAL_COMMUNITY): Payer: Self-pay | Admitting: Psychiatry

## 2013-12-03 VITALS — BP 120/78 | Ht 67.0 in | Wt 218.0 lb

## 2013-12-03 DIAGNOSIS — F1310 Sedative, hypnotic or anxiolytic abuse, uncomplicated: Secondary | ICD-10-CM

## 2013-12-03 DIAGNOSIS — F411 Generalized anxiety disorder: Secondary | ICD-10-CM

## 2013-12-03 DIAGNOSIS — G894 Chronic pain syndrome: Secondary | ICD-10-CM

## 2013-12-03 DIAGNOSIS — F319 Bipolar disorder, unspecified: Secondary | ICD-10-CM

## 2013-12-03 DIAGNOSIS — F5105 Insomnia due to other mental disorder: Secondary | ICD-10-CM

## 2013-12-03 DIAGNOSIS — F3130 Bipolar disorder, current episode depressed, mild or moderate severity, unspecified: Secondary | ICD-10-CM

## 2013-12-03 DIAGNOSIS — F39 Unspecified mood [affective] disorder: Secondary | ICD-10-CM

## 2013-12-03 DIAGNOSIS — F1010 Alcohol abuse, uncomplicated: Secondary | ICD-10-CM

## 2013-12-03 MED ORDER — AMITRIPTYLINE HCL 100 MG PO TABS: 100.0000 mg | ORAL_TABLET | Freq: Every day | ORAL | Status: DC

## 2013-12-03 MED ORDER — DULOXETINE HCL 30 MG PO CPEP: ORAL_CAPSULE | ORAL | Status: DC

## 2013-12-03 MED ORDER — DIVALPROEX SODIUM 250 MG PO DR TAB: 750.0000 mg | DELAYED_RELEASE_TABLET | Freq: Every day | ORAL | Status: DC

## 2013-12-03 NOTE — Progress Notes (Signed)
Patient ID: AMENAH TUCCI, female   DOB: June 03, 1951, 63 y.o.   MRN: [id number] [facility] [id number] Progress Note BRIANNA BENNETT MRN: [id number] DOB: Sep 11, 1951 Age: 63 y.o.  Date: 12/03/2013  Chief Complaint  Patient presents with  . Depression  . Anxiety  . Follow-up    History of present illness Patient is 63 year old [place] American female who came for her followup appointment. He has not been seen since last July when she met with Dr. Adele Schilder. She's currently living with her 50 year old grandson in [place] but they're about to move next week to [place] to be with her daughter.  The patient has been fairly stable but she is nervous about the move. She does feel like the medications that we prescribe are helpful but she would like to restart amitriptyline to help with sleep and pain. She does admit that she still drinks on weekends and occasionally drinks enough to pass out but this is very rare. I suggested when she moves she takes on a fresh start and try to stop drinking and she agrees. She does feel like the increase Cymbalta has helped her mood and pain level per  Current psychotropic medication Depakote 750 mg at bedtime Amitriptyline 100 mg at bedtime Cymbalta 90 milligrams per day  Past psychiatric history Patient has history of one psychiatric admission in 2000 needed behavioral [place]. She took overdose on her pills she was also intoxicated with alcohol. Patient has also seen by psychiatrist in 2004. At that time she was seeking more benzodiazepines and pain medication.  Vitals: BP 120/78  Ht _0  (1.702 m)  Wt 218 lb (98.884 kg)  BMI 34.14 kg/m2  Allergies: Allergies  Allergen Reactions  . Neomycin-Bacitracin Zn-Polymyx Other (See Comments)    Reaction: skin starts to become raw and peels off  . Penicillins Hives  . Sulfonamide Derivatives Hives and Swelling   Medical History: Past Medical History  Diagnosis Date  . Hypertension   .  Allergic rhinitis   . Anxiety   . Depression   . GERD (gastroesophageal reflux disease)   . High cholesterol   . Hypothyroid   . Chronic back pain   . Arthritis   . Immature cataract   . Sleep apnea     STOP BANG score=4  . Seizures 20 yrs ago    unknown etiology-on meds  . Headaches, cluster 3 yrs ago    last one  . Mania   . 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. PTSD (post-traumatic stress disorder)   Patient has history of hypertension, allergic rhinitis, hypothyroidism, chronic back pain, hyperlipidemia and obesity.  She most recently had surgery on her right knee.  Surgical History: Past Surgical History  Procedure Laterality Date  . Left foot  x 2  . Foot surgery      left foot-  . Partial hysterectomy    . Oophoectomy  bilateral  . Cholecystectomy    . Lumbar disc surgery  L4-5  . Back surgery    . Knee arthroscopy  01/2012    right  . Total knee arthroplasty  07/01/2012    Procedure: TOTAL KNEE ARTHROPLASTY;  Surgeon: Carole Civil, MD;  Location: [facility];  Service: Orthopedics;  Laterality: Right;  . Total knee revision Right 01/03/2013    Procedure: Patellaplasty Right Knee;  Surgeon: Carole Civil, MD;  Location: [facility];  Service: Orthopedics;  Laterality: Right;  . Knee arthroscopy with lateral release Right 01/03/2013  Procedure: Lateral Release Patella Right Knee;  Surgeon: Carole Civil, MD;  Location: [facility];  Service: Orthopedics;  Laterality: Right;  . Incision and drainage Right 01/03/2013    Procedure: INCISION AND DRAINAGE;  Surgeon: Carole Civil, MD;  Location: [facility];  Service: Orthopedics;  Laterality: Right;  . Esophagogastroduodenoscopy  10/16/2008    OEV:OJJKK hiatal hernia otherwise normal esophagus, stomach  D1, D2, status post passage of a 56-French Maloney dilator  . Colonoscopy  10/16/2008    XFG:[id number], otherwise normal rectum; pancolonic diverticula the remainder of the colonic mucosa appeared normal. Next TCS due 10/2013.   Marland Kitchen Esophagogastroduodenoscopy  06/28/2006    VEL:FYBOFB esophagus. Small hiatal hernia. Otherwise normal stomach, D1 and D2, status post passage of a 56 [place] Maloney dilator  . Colonoscopy  06/28/2006    RMR: Internal hemorrhoids. Diminutive rectal polyps, cold biopsied/ Pancolonic diverticula. Polyps in the right colon removed   . Esophagogastroduodenoscopy (egd) with esophageal dilation N/A 07/14/2013    Procedure: ESOPHAGOGASTRODUODENOSCOPY (EGD) WITH ESOPHAGEAL DILATION;  Surgeon: Daneil Dolin, MD;  Location: [facility];  Service: Endoscopy;  Laterality: N/A;  1:00   Family History: family history includes ADD / ADHD in her grandchild and grandchild; Alcohol abuse in her brother, brother, brother, brother, brother, father, mother, and another family member; Anxiety disorder in her brother, brother, brother, brother, brother, father, and mother; Bipolar disorder in her sister; Dementia in her paternal aunt; Diabetes in an other family member; Drug abuse in her brother and brother; Heart disease in an other family member; Seizures in her brother and brother. There is no history of Depression, OCD, Paranoid behavior, Schizophrenia, Sexual abuse, or Physical abuse.   Psychosocial history Patient has been married for 16 years. She has 2 children. She is married however her relationship is very tense. She has history of leaving her husband in the past.  Currently she lives by herself.  Mental status examination Patient is mildly obese female who is casually dressed and fairly groomed.  She has difficulty walking due to pain.  She is cooperative and maintained fair eye contact. Her speech is clear and coherent. Her attention and concentration is fair. She denies any active or passive suicidal thinking and homicidal thinking. She's alert and oriented x3. There are no psychotic symptoms present at this time. She denies any auditory or visual hallucination. Her insight judgment is fair and her  impulse control is okay.  Lab Results:  Results for orders placed during the hospital encounter of 01/03/13 (from the past 8736 hour(s))  WOUND CULTURE   Collection Time    01/03/13 10:05 AM      Result Value Ref Range   Specimen Description KNEE RIGHT JOINT     Special Requests NONE     Gram Stain       Value: RARE WBC PRESENT, PREDOMINANTLY MONONUCLEAR     NO SQUAMOUS EPITHELIAL CELLS SEEN     NO ORGANISMS SEEN   Culture NO GROWTH 2 DAYS     Report Status 01/05/2013 FINAL    WOUND CULTURE   Collection Time    01/03/13 10:05 AM      Result Value Ref Range   Specimen Description KNEE JOINT RIGHT     Special Requests NONE     Gram Stain       Value: RARE WBC PRESENT, PREDOMINANTLY PMN     NO SQUAMOUS EPITHELIAL CELLS SEEN     NO ORGANISMS SEEN   Culture NO GROWTH 2 DAYS  Report Status 01/05/2013 FINAL    WOUND CULTURE   Collection Time    01/03/13 10:05 AM      Result Value Ref Range   Specimen Description KNEE RIGHT JOINT     Special Requests NONE     Gram Stain       Value: RARE WBC PRESENT,BOTH PMN AND MONONUCLEAR     NO SQUAMOUS EPITHELIAL CELLS SEEN     NO ORGANISMS SEEN   Culture NO GROWTH 2 DAYS     Report Status 01/05/2013 FINAL    ANAEROBIC CULTURE   Collection Time    01/03/13 10:05 AM      Result Value Ref Range   Specimen Description WOUND RIGHT KNEE JOINT 1 OF 3     Special Requests NONE     Gram Stain       Value: RARE WBC PRESENT, PREDOMINANTLY PMN     NO SQUAMOUS EPITHELIAL CELLS SEEN     NO ORGANISMS SEEN   Culture NO ANAEROBES ISOLATED     Report Status 01/08/2013 FINAL    ANAEROBIC CULTURE   Collection Time    01/03/13 10:05 AM      Result Value Ref Range   Specimen Description WOUND RIGHT KNEE JOINT 2 OF 3     Special Requests NONE     Gram Stain       Value: RARE WBC PRESENT,BOTH PMN AND MONONUCLEAR     NO SQUAMOUS EPITHELIAL CELLS SEEN     NO ORGANISMS SEEN   Culture NO ANAEROBES ISOLATED     Report Status 01/08/2013 FINAL     ANAEROBIC CULTURE   Collection Time    01/03/13 10:05 AM      Result Value Ref Range   Specimen Description WOUND RIGHT KNEE JOINT 3 OF 3     Special Requests NONE     Gram Stain       Value: RARE WBC PRESENT, PREDOMINANTLY MONONUCLEAR     NO SQUAMOUS EPITHELIAL CELLS SEEN     NO ORGANISMS SEEN   Culture NO ANAEROBES ISOLATED     Report Status 01/08/2013 FINAL    Results for orders placed during the hospital encounter of 12/30/12 (from the past 8736 hour(s))  SURGICAL PCR SCREEN   Collection Time    12/30/12  9:59 AM      Result Value Ref Range   MRSA, PCR POSITIVE (*) NEGATIVE   Staphylococcus aureus POSITIVE (*) NEGATIVE  C-REACTIVE PROTEIN   Collection Time    12/30/12 10:30 AM      Result Value Ref Range   CRP 1.6 (*) <0.60 mg/dL  SEDIMENTATION RATE   Collection Time    12/30/12 10:30 AM      Result Value Ref Range   Sed Rate 40 (*) 0 - 22 mm/hr  APTT   Collection Time    12/30/12 10:30 AM      Result Value Ref Range   aPTT 31  24 - 37 seconds  BASIC METABOLIC PANEL   Collection Time    12/30/12 10:30 AM      Result Value Ref Range   Sodium 140  135 - 145 mEq/L   Potassium 3.2 (*) 3.5 - 5.1 mEq/L   Chloride 100  96 - 112 mEq/L   CO2 29  19 - 32 mEq/L   Glucose, Bld 94  70 - 99 mg/dL   BUN 19  6 - 23 mg/dL   Creatinine, Ser 1.78 (*) 0.50 - 1.10 mg/dL  Calcium 9.5  8.4 - 10.5 mg/dL   GFR calc non Af Amer 29 (*) >90 mL/min   GFR calc Af Amer 34 (*) >90 mL/min  CBC WITH DIFFERENTIAL   Collection Time    12/30/12 10:30 AM      Result Value Ref Range   WBC 6.7  4.0 - 10.5 K/uL   RBC 5.11  3.87 - 5.11 MIL/uL   Hemoglobin 13.5  12.0 - 15.0 g/dL   HCT 41.5  36.0 - 46.0 %   MCV 81.2  78.0 - 100.0 fL   MCH 26.4  26.0 - 34.0 pg   MCHC 32.5  30.0 - 36.0 g/dL   RDW 16.5 (*) 11.5 - 15.5 %   Platelets 212  150 - 400 K/uL   Neutrophils Relative % 51  43 - 77 %   Neutro Abs 3.4  1.7 - 7.7 K/uL   Lymphocytes Relative 39  12 - 46 %   Lymphs Abs 2.7  0.7 - 4.0 K/uL    Monocytes Relative 9  3 - 12 %   Monocytes Absolute 0.6  0.1 - 1.0 K/uL   Eosinophils Relative 0  0 - 5 %   Eosinophils Absolute 0.0  0.0 - 0.7 K/uL   Basophils Relative 0  0 - 1 %   Basophils Absolute 0.0  0.0 - 0.1 K/uL  PROTIME-INR   Collection Time    12/30/12 10:30 AM      Result Value Ref Range   Prothrombin Time 13.4  11.6 - 15.2 seconds   INR 1.03  0.00 - 1.49  TYPE AND SCREEN   Collection Time    12/30/12 10:30 AM      Result Value Ref Range   ABO/RH(D) O POS     Antibody Screen NEG     Sample Expiration 01/02/2013     Assessment Axis I bipolar disorder NOS, benzodiazepine abuse, alcohol abuse Axis II deferred Axis III see medical history Axis IV moderate Axis V 60-65  Plan/Discussion: I'm not willing to change her medicines right now she is moving in one week.Marland Kitchen She will need to have a Depakote level done with her new physician. For now she can continue the Cymbalta Elavil and Depakote. She will not be scheduled bacteria she is moving next week. She'll need to find a new provider Time spent 25 minutes.  More than 50% of the time spent in psychoeducation, counseling and coordination of care.  Discuss safety plan that anytime having active suicidal thoughts or homicidal thoughts then patient need to call 911 or go to the local emergency room.  MEDICATIONS this encounter: Meds ordered this encounter  Medications  . DULoxetine (CYMBALTA) 30 MG capsule    Sig: Take 1 in am and 2 at bed time    Dispense:  90 capsule    Refill:  1  . amitriptyline (ELAVIL) 100 MG tablet    Sig: Take 1 tablet (100 mg total) by mouth at bedtime.    Dispense:  30 tablet    Refill:  1  . divalproex (DEPAKOTE) 250 MG DR tablet    Sig: Take 3 tablets (750 mg total) by mouth at bedtime.    Dispense:  90 tablet    Refill:  1    Medical Decision Making Problem Points:  Established problem, stable/improving (1), Established problem, worsening (2), New problem, with no additional work-up  planned (3), Review of last therapy session (1) and Review of psycho-social stressors (1) Data Points:  Review  or order clinical lab tests (1) Review and summation of old records (2) Review of medication regiment & side effects (2) Review of new medications or change in dosage (2)  Terrion Poblano, MD

## 2013-12-09 ENCOUNTER — Ambulatory Visit (INDEPENDENT_AMBULATORY_CARE_PROVIDER_SITE_OTHER): Payer: Medicare HMO | Admitting: Orthopedic Surgery

## 2013-12-09 ENCOUNTER — Ambulatory Visit (INDEPENDENT_AMBULATORY_CARE_PROVIDER_SITE_OTHER): Payer: Medicare HMO

## 2013-12-09 ENCOUNTER — Encounter: Payer: Self-pay | Admitting: Orthopedic Surgery

## 2013-12-09 VITALS — BP 143/81 | Ht 63.0 in | Wt 213.0 lb

## 2013-12-09 DIAGNOSIS — M25569 Pain in unspecified knee: Secondary | ICD-10-CM

## 2013-12-09 DIAGNOSIS — G894 Chronic pain syndrome: Secondary | ICD-10-CM

## 2013-12-09 DIAGNOSIS — S8000XA Contusion of unspecified knee, initial encounter: Secondary | ICD-10-CM

## 2013-12-09 MED ORDER — OXYCODONE-ACETAMINOPHEN 5-325 MG PO TABS: 1.0000 | ORAL_TABLET | ORAL | Status: DC | PRN

## 2013-12-09 NOTE — Progress Notes (Signed)
Patient ID: Diane [name], female   DOB: 1951/04/18, 63 y.o.   MRN: [id number]  Chief Complaint  Patient presents with  . Knee Pain    Right knee pain s/p fall 2 weeks ago    63 year old female status post knee arthroscopy right knee followed by total knee followed by patelloplasty complains of pain after falling 2 weeks ago. Most of her pain is of the anteromedial aspect of the knee associated with extension she feels some crepitation and a clicking sensation in the knee. She also continues to have lateral patellofemoral pain  She complains of blurred vision heartburn nervousness anxiety and depression she bruises easily. Past Medical History  Diagnosis Date  . Hypertension   . Allergic rhinitis   . Anxiety   . Depression   . GERD (gastroesophageal reflux disease)   . High cholesterol   . Hypothyroid   . Chronic back pain   . Arthritis   . Immature cataract   . Sleep apnea     STOP BANG score=4  . Seizures 20 yrs ago    unknown etiology-on meds  . Headaches, cluster 3 yrs ago    last one  . Mania   . 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. PTSD (post-traumatic stress disorder)    Past Surgical History  Procedure Laterality Date  . Left foot  x 2  . Foot surgery      left foot-  . Partial hysterectomy    . Oophoectomy  bilateral  . Cholecystectomy    . Lumbar disc surgery  L4-5  . Back surgery    . Knee arthroscopy  01/2012    right  . Total knee arthroplasty  07/01/2012    Procedure: TOTAL KNEE ARTHROPLASTY;  Surgeon: Vickki HearingStanley E Tanetta Fuhriman, MD;  Location: [facility];  Service: Orthopedics;  Laterality: Right;  . Total knee revision Right 01/03/2013    Procedure: Patellaplasty Right Knee;  Surgeon: Vickki HearingStanley E Vashti Bolanos, MD;  Location: [facility];  Service: Orthopedics;  Laterality: Right;  . Knee arthroscopy with lateral release Right 01/03/2013    Procedure: Lateral Release Patella Right Knee;  Surgeon: Vickki HearingStanley E Riven Beebe, MD;  Location: [facility];  Service: Orthopedics;  Laterality: Right;   . Incision and drainage Right 01/03/2013    Procedure: INCISION AND DRAINAGE;  Surgeon: Vickki HearingStanley E Lauryn Lizardi, MD;  Location: [facility];  Service: Orthopedics;  Laterality: Right;  . Esophagogastroduodenoscopy  10/16/2008    QMV:[id number]:Small hiatal hernia otherwise normal esophagus, stomach  D1, D2, status post passage of a 56-French Maloney dilator  . Colonoscopy  10/16/2008    GEX:[id number]:Hemorrhoids, otherwise normal rectum; pancolonic diverticula the remainder of the colonic mucosa appeared normal. Next TCS due 10/2013.  Marland Kitchen. Esophagogastroduodenoscopy  06/28/2006    UUV:[id number]:Normal esophagus. Small hiatal hernia. Otherwise normal stomach, D1 and D2, status post passage of a 56 [place] Maloney dilator  . Colonoscopy  06/28/2006    RMR: Internal hemorrhoids. Diminutive rectal polyps, cold biopsied/ Pancolonic diverticula. Polyps in the right colon removed   . Esophagogastroduodenoscopy (egd) with esophageal dilation N/A 07/14/2013    Procedure: ESOPHAGOGASTRODUODENOSCOPY (EGD) WITH ESOPHAGEAL DILATION;  Surgeon: Corbin Adeobert M Rourk, MD;  Location: [facility];  Service: Endoscopy;  Laterality: N/A;  1:00    Vital signs: BP 143/81  Ht 5\' 3"  (1.6 m)  Wt 213 lb (96.616 kg)  BMI 37.74 kg/m2   General the patient is well-developed and well-nourished grooming and hygiene are normal Oriented x3 Mood and affect normal Ambulation normal  Inspection of  the right knee well-healed incision tenderness in the peripatellar region of the lateral patella tenderness over the anteromedial aspect of the tibia Knee flexion equals 115 full extension is noted  collateral ligaments are stable AP stability confirmed by drawer testing  Motor exam is normal Skin clean dry and intact  Cardiovascular exam is normal Sensory exam normal  X-rays are normal  Encounter Diagnoses  Name Primary?  . Knee pain Yes  . Knee contusion   . Chronic pain syndrome     Recommend 4 week followup Use ice to control swelling Applied Aspercreme for  inflammation Meds ordered this encounter  Medications  . oxyCODONE-acetaminophen (PERCOCET/ROXICET) 5-325 MG per tablet    Sig: Take 1 tablet by mouth every 4 (four) hours as needed.    Dispense:  180 tablet    Refill:  0

## 2013-12-09 NOTE — Patient Instructions (Signed)
Ice knee 3 times a day Use Aspercreme 3 times a day

## 2014-01-06 ENCOUNTER — Ambulatory Visit (INDEPENDENT_AMBULATORY_CARE_PROVIDER_SITE_OTHER): Payer: Medicare HMO | Admitting: Orthopedic Surgery

## 2014-01-06 VITALS — BP 123/69 | Ht 63.0 in | Wt 213.0 lb

## 2014-01-06 DIAGNOSIS — G894 Chronic pain syndrome: Secondary | ICD-10-CM

## 2014-01-06 MED ORDER — OXYCODONE-ACETAMINOPHEN 5-325 MG PO TABS: 1.0000 | ORAL_TABLET | ORAL | Status: DC | PRN

## 2014-01-06 NOTE — Progress Notes (Signed)
Patient ID: Deretha Emoryris M Lowrey, female   DOB: April 16, 1951, 63 y.o.   MRN: [id number]  Chief Complaint  Patient presents with  . Follow-up    4 week recheck right knee contusion DOI approx. 11/25/13    BP 123/69  Ht 5\' 3"  (1.6 m)  Wt 213 lb (96.616 kg)  BMI 37.74 kg/m2  Recheck right knee status post right total knee a couple years ago had a contusion after fall onto right knee. Also should be noted that we revise the knee for scar tissue and the patellofemoral plastic.  Since the fall the popping and clicking has returned I suspect scar tissue. Still has tenderness anterolateral patellofemoral region General appearance is normal, the patient is alert and oriented x3 with normal mood and affect.  Knee flexion about 105 extension shows a little bit of a lag and she is showing some quadriceps weakness. Neurovascular exam otherwise intact. Using the probe without assistive device.  Knee contusion Patellofemoral plain post knee replacement  Continue topical creams and Percocet for pain

## 2014-01-06 NOTE — Patient Instructions (Signed)
Continue topical creme and pain medicine

## 2014-02-13 ENCOUNTER — Other Ambulatory Visit (HOSPITAL_COMMUNITY): Payer: Self-pay | Admitting: Psychiatry

## 2014-02-16 ENCOUNTER — Other Ambulatory Visit: Payer: Self-pay | Admitting: *Deleted

## 2014-02-16 ENCOUNTER — Telehealth: Payer: Self-pay | Admitting: Orthopedic Surgery

## 2014-02-16 DIAGNOSIS — G894 Chronic pain syndrome: Secondary | ICD-10-CM

## 2014-02-16 MED ORDER — OXYCODONE-ACETAMINOPHEN 5-325 MG PO TABS: 1.0000 | ORAL_TABLET | ORAL | Status: DC | PRN

## 2014-02-16 NOTE — Telephone Encounter (Signed)
yes

## 2014-02-16 NOTE — Telephone Encounter (Signed)
Patient called on voice mail - received her message, requesting refill on: oxyCODONE-acetaminophen (PERCOCET/ROXICET) 5-325 MG per tablet [[id number]].  Her phone # is [phone]. (I also saw another phone note for this date of request, entered by Doneen Poisson.)

## 2014-02-16 NOTE — Telephone Encounter (Signed)
Routing to Dr Harrison 

## 2014-02-16 NOTE — Telephone Encounter (Signed)
Left message prescription was ready for pick up 

## 2014-02-23 ENCOUNTER — Telehealth (HOSPITAL_COMMUNITY): Payer: Self-pay | Admitting: *Deleted

## 2014-02-23 ENCOUNTER — Other Ambulatory Visit (HOSPITAL_COMMUNITY): Payer: Self-pay | Admitting: Psychiatry

## 2014-02-23 DIAGNOSIS — F411 Generalized anxiety disorder: Secondary | ICD-10-CM

## 2014-02-23 DIAGNOSIS — F39 Unspecified mood [affective] disorder: Secondary | ICD-10-CM

## 2014-02-23 DIAGNOSIS — G894 Chronic pain syndrome: Secondary | ICD-10-CM

## 2014-02-23 MED ORDER — DULOXETINE HCL 30 MG PO CPEP: ORAL_CAPSULE | ORAL | Status: DC

## 2014-02-23 NOTE — Telephone Encounter (Signed)
done

## 2014-03-10 ENCOUNTER — Other Ambulatory Visit (HOSPITAL_COMMUNITY): Payer: Self-pay | Admitting: Psychiatry

## 2014-03-10 DIAGNOSIS — F3130 Bipolar disorder, current episode depressed, mild or moderate severity, unspecified: Secondary | ICD-10-CM

## 2014-03-10 MED ORDER — DIVALPROEX SODIUM 250 MG PO DR TAB: 750.0000 mg | DELAYED_RELEASE_TABLET | Freq: Every day | ORAL | Status: DC

## 2014-03-16 ENCOUNTER — Ambulatory Visit (INDEPENDENT_AMBULATORY_CARE_PROVIDER_SITE_OTHER): Payer: Medicare HMO | Admitting: Orthopedic Surgery

## 2014-03-16 ENCOUNTER — Telehealth: Payer: Self-pay | Admitting: Orthopedic Surgery

## 2014-03-16 VITALS — Ht 63.0 in | Wt 213.0 lb

## 2014-03-16 DIAGNOSIS — G894 Chronic pain syndrome: Secondary | ICD-10-CM

## 2014-03-16 DIAGNOSIS — L02419 Cutaneous abscess of limb, unspecified: Secondary | ICD-10-CM

## 2014-03-16 DIAGNOSIS — L03119 Cellulitis of unspecified part of limb: Secondary | ICD-10-CM

## 2014-03-16 MED ORDER — OXYCODONE-ACETAMINOPHEN 5-325 MG PO TABS: 1.0000 | ORAL_TABLET | ORAL | Status: DC | PRN

## 2014-03-16 MED ORDER — OXYCODONE HCL ER 20 MG PO T12A: 20.0000 mg | EXTENDED_RELEASE_TABLET | Freq: Two times a day (BID) | ORAL | Status: DC

## 2014-03-16 NOTE — Progress Notes (Signed)
Patient ID: Diane [name], female   DOB: 05-May-1951, 63 y.o.   MRN: [id number]  Recheck right knee status post right total knee complicated by a patellofemoral pain and revision. Patient fell about 2 months ago presents back for reevaluation complaining of constant aching pain in the lateral proximal tibia without radiation  Review of systems musculoskeletal intermittent back pain does not appear to be radicular  General appearance is normal, the patient is alert and oriented x3 with normal mood and affect.  Tenderness around the femoral component laterally and medially. The knee feels stable collateral ligaments and anterior posterior No joint effusion no warmth no redness no drainage   Recommend workup for infection  And then if normal second opinion  No orders of the defined types were placed in this encounter.    Meds ordered this encounter  Medications  . oxyCODONE-acetaminophen (PERCOCET/ROXICET) 5-325 MG per tablet    Sig: Take 1 tablet by mouth every 4 (four) hours as needed.    Dispense:  90 tablet    Refill:  0  . OxyCODONE (OXYCONTIN) 20 mg T12A 12 hr tablet    Sig: Take 1 tablet (20 mg total) by mouth every 12 (twelve) hours.    Dispense:  60 tablet    Refill:  0

## 2014-03-16 NOTE — Telephone Encounter (Signed)
Nurse had faxed lab orders to [facility], fax [phone].  Patient has Diane [employer] Gold,THN, with primary care physician Dr. Fuller CanadaKoberlien, [facility]. Referral had been obtained and approved for [facility] - Auth# [id number], valid from 03/16/14 through 09/16/14.  Checking with [employer] regarding any other type of approvals needed.

## 2014-03-17 LAB — CBC
HCT: 34.6 % — ABNORMAL LOW (ref 36.0–46.0)
Hemoglobin: 11.7 g/dL — ABNORMAL LOW (ref 12.0–15.0)
MCH: 27.1 pg (ref 26.0–34.0)
MCHC: 33.8 g/dL (ref 30.0–36.0)
MCV: 80.3 fL (ref 78.0–100.0)
Platelets: 186 10*3/uL (ref 150–400)
RBC: 4.31 MIL/uL (ref 3.87–5.11)
RDW: 13.9 % (ref 11.5–15.5)
WBC: 5 10*3/uL (ref 4.0–10.5)

## 2014-03-17 LAB — C-REACTIVE PROTEIN: CRP: <0.5 mg/dL (ref <0.60)

## 2014-03-17 LAB — SEDIMENTATION RATE: Sed Rate: 4 mm/hr (ref 0–22)

## 2014-03-17 NOTE — Telephone Encounter (Signed)
Noted  

## 2014-03-17 NOTE — Telephone Encounter (Signed)
Per [employer] representative/team lead Leverne Humblesatherine Perry, no pre-authorization required for labs.  Relayed this information to patient; aware orders have been forwarded to [facility].

## 2014-03-18 ENCOUNTER — Other Ambulatory Visit: Payer: Self-pay | Admitting: Orthopedic Surgery

## 2014-03-18 MED ORDER — MORPHINE SULFATE ER 10 MG PO CP24: 10.0000 mg | ORAL_CAPSULE | Freq: Every day | ORAL | Status: DC

## 2014-03-19 ENCOUNTER — Encounter (HOSPITAL_COMMUNITY): Payer: Self-pay | Admitting: Psychiatry

## 2014-03-19 ENCOUNTER — Ambulatory Visit (INDEPENDENT_AMBULATORY_CARE_PROVIDER_SITE_OTHER): Payer: Medicare HMO | Admitting: Psychiatry

## 2014-03-19 VITALS — BP 98/60 | Ht 63.0 in | Wt 219.0 lb

## 2014-03-19 DIAGNOSIS — F3130 Bipolar disorder, current episode depressed, mild or moderate severity, unspecified: Secondary | ICD-10-CM

## 2014-03-19 DIAGNOSIS — F411 Generalized anxiety disorder: Secondary | ICD-10-CM

## 2014-03-19 DIAGNOSIS — G894 Chronic pain syndrome: Secondary | ICD-10-CM

## 2014-03-19 DIAGNOSIS — F5105 Insomnia due to other mental disorder: Secondary | ICD-10-CM

## 2014-03-19 DIAGNOSIS — F39 Unspecified mood [affective] disorder: Secondary | ICD-10-CM

## 2014-03-19 DIAGNOSIS — F489 Nonpsychotic mental disorder, unspecified: Secondary | ICD-10-CM

## 2014-03-19 MED ORDER — ALPRAZOLAM 0.5 MG PO TABS: 0.5000 mg | ORAL_TABLET | Freq: Two times a day (BID) | ORAL | Status: DC

## 2014-03-19 MED ORDER — DULOXETINE HCL 30 MG PO CPEP: ORAL_CAPSULE | ORAL | Status: DC

## 2014-03-19 MED ORDER — AMITRIPTYLINE HCL 100 MG PO TABS: 100.0000 mg | ORAL_TABLET | Freq: Every day | ORAL | Status: DC

## 2014-03-19 MED ORDER — DIVALPROEX SODIUM 250 MG PO DR TAB: 750.0000 mg | DELAYED_RELEASE_TABLET | Freq: Every day | ORAL | Status: DC

## 2014-03-19 NOTE — Patient Instructions (Signed)
-  STOP CITALOPRAM

## 2014-03-19 NOTE — Progress Notes (Signed)
Patient ID: Diane [name], female   DOB: 1950/12/10, 63 y.o.   MRN: [id number] Patient ID: Diane [name], female   DOB: Jan 27, 1951, 63 y.o.   MRN: [id number] [facility] [address] Progress Note Diane [name] MRN: [id number] DOB: 26-Sep-1950 Age: 63 y.o.  Date: 03/19/2014  Chief Complaint  Patient presents with  . Anxiety  . Depression  . Follow-up    History of present illness Patient is 63 year old [place] American female who came for her followup appointment. She is currently living alone in [place] and is on disability  The patient was last seen in March. At that time she told me she was moving to [place] to be with her daughter. She didn't like living down there and she and her grandson have come back. Her grandson got his own place to live and she is now staying in a motel temporarily. Her mood is "up and down" because she is unsure where she is going to live. She's in a lot of pain in her knee and would like to get back on the amitriptyline since she ran out. A nurse practitioner and primary care put her on Celexa without realizing that she's already on Cymbalta. I explained to her that made no sense to be on both. She does admit that she still drinks periodically sometimes too much on weekends. I warned her against this. She denies being severely depressed or suicidal   Current psychotropic medication Depakote 750 mg at bedtime Amitriptyline 100 mg at bedtime Cymbalta 90 milligrams per day  Past psychiatric history Patient has history of one [facility] She took overdose on her pills she was also intoxicated with alcohol. Patient has also seen by psychiatrist in 2004. At that time she was seeking more benzodiazepines and pain medication.  Vitals: BP 98/60  Ht 5\' 3"  (1.6 m)  Wt 219 lb (99.338 kg)  BMI 38.80 kg/m2  Allergies: Allergies  Allergen Reactions  . Neomycin-Bacitracin Zn-Polymyx Other (See Comments)   Reaction: skin starts to become raw and peels off  . Penicillins Hives  . Sulfonamide Derivatives Hives and Swelling   Medical History: Past Medical History  Diagnosis Date  . Hypertension   . Allergic rhinitis   . Anxiety   . Depression   . GERD (gastroesophageal reflux disease)   . High cholesterol   . Hypothyroid   . Chronic back pain   . Arthritis   . Immature cataract   . Sleep apnea     STOP BANG score=4  . Seizures 20 yrs ago    unknown etiology-on meds  . Headaches, cluster 3 yrs ago    last one  . Mania   . 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. PTSD (post-traumatic stress disorder)   Patient has history of hypertension, allergic rhinitis, hypothyroidism, chronic back pain, hyperlipidemia and obesity.  She most recently had surgery on her right knee.  Surgical History: Past Surgical History  Procedure Laterality Date  . Left foot  x 2  . Foot surgery      left foot-  . Partial hysterectomy    . Oophoectomy  bilateral  . Cholecystectomy    . Lumbar disc surgery  L4-5  . Back surgery    . Knee arthroscopy  01/2012    right  . Total knee arthroplasty  07/01/2012    Procedure: TOTAL KNEE ARTHROPLASTY;  Surgeon: Vickki Hearing, MD;  Location: [facility];  Service: Orthopedics;  Laterality:  Right;  . Total knee revision Right 01/03/2013    Procedure: Patellaplasty Right Knee;  Surgeon: Vickki Hearing, MD;  Location: [facility];  Service: Orthopedics;  Laterality: Right;  . Knee arthroscopy with lateral release Right 01/03/2013    Procedure: Lateral Release Patella Right Knee;  Surgeon: Vickki Hearing, MD;  Location: [facility];  Service: Orthopedics;  Laterality: Right;  . Incision and drainage Right 01/03/2013    Procedure: INCISION AND DRAINAGE;  Surgeon: Vickki Hearing, MD;  Location: [facility];  Service: Orthopedics;  Laterality: Right;  . Esophagogastroduodenoscopy  10/16/2008    ZOX:WRUEA hiatal hernia otherwise normal esophagus, stomach  D1, D2, status post passage  of a 56-French Maloney dilator  . Colonoscopy  10/16/2008    VWU:[id number], otherwise normal rectum; pancolonic diverticula the remainder of the colonic mucosa appeared normal. Next TCS due 10/2013.  Marland Kitchen Esophagogastroduodenoscopy  06/28/2006    FAO:ZHYQMV esophagus. Small hiatal hernia. Otherwise normal stomach, D1 and D2, status post passage of a 56 [place] Maloney dilator  . Colonoscopy  06/28/2006    RMR: Internal hemorrhoids. Diminutive rectal polyps, cold biopsied/ Pancolonic diverticula. Polyps in the right colon removed   . Esophagogastroduodenoscopy (egd) with esophageal dilation N/A 07/14/2013    Procedure: ESOPHAGOGASTRODUODENOSCOPY (EGD) WITH ESOPHAGEAL DILATION;  Surgeon: Corbin Ade, MD;  [facility];  Service: Endoscopy;  Laterality: N/A;  1:00   Family History: family history includes ADD / ADHD in her grandchild and grandchild; Alcohol abuse in her brother, brother, brother, brother, brother, father, mother, and another family member; Anxiety disorder in her brother, brother, brother, brother, brother, father, and mother; Bipolar disorder in her sister; Dementia in her paternal aunt; Diabetes in an other family member; Drug abuse in her brother and brother; Heart disease in an other family member; Seizures in her brother and brother. There is no history of Depression, OCD, Paranoid behavior, Schizophrenia, Sexual abuse, or Physical abuse.   Psychosocial history Patient has been married for 16 years. She has 2 children. She is married however her relationship is very tense. She has history of leaving her husband in the past.  Currently she lives by herself.  Mental status examination Patient is mildly obese female who is casually dressed and fairly groomed.  She has difficulty walking due to pain.  She is cooperative and maintained fair eye contact. Her speech is clear and coherent. Her attention and concentration is fair. She denies any active or passive suicidal  thinking and homicidal thinking. She's alert and oriented x3. There are no psychotic symptoms present at this time. She denies any auditory or visual hallucination. Her insight judgment is fair and her impulse control is okay.  Lab Results:  Results for orders placed in visit on 03/16/14 (from the past 8736 hour(s))  SEDIMENTATION RATE   Collection Time    03/16/14 10:09 AM      Result Value Ref Range   Sed Rate 4  0 - 22 mm/hr  CBC   Collection Time    03/16/14 10:09 AM      Result Value Ref Range   WBC 5.0  4.0 - 10.5 K/uL   RBC 4.31  3.87 - 5.11 MIL/uL   Hemoglobin 11.7 (*) 12.0 - 15.0 g/dL   HCT 78.4 (*) 69.6 - 29.5 %   MCV 80.3  78.0 - 100.0 fL   MCH 27.1  26.0 - 34.0 pg   MCHC 33.8  30.0 - 36.0 g/dL   RDW 28.4  13.2 -  15.5 %   Platelets 186  150 - 400 K/uL  C-REACTIVE PROTEIN   Collection Time    03/16/14 10:09 AM      Result Value Ref Range   CRP <0.5  <0.60 mg/dL   Assessment Axis I bipolar disorder NOS, benzodiazepine abuse, alcohol abuse Axis II deferred Axis III see medical history Axis IV moderate Axis V 60-65  Plan/Discussion: . For now she can continue the Cymbalta Elavil and Depakote and low-dose Xanax. She was warned to stop drinking. She was told to stop citalopram. She'll return to see me in 4 weeks Time spent 25 minutes.  More than 50% of the time spent in psychoeducation, counseling and coordination of care.  Discuss safety plan that anytime having active suicidal thoughts or homicidal thoughts then patient need to call 911 or go to the local emergency room.  MEDICATIONS this encounter: Meds ordered this encounter  Medications  . DULoxetine (CYMBALTA) 30 MG capsule    Sig: Take 1 in am and 2 at bed time    Dispense:  90 capsule    Refill:  2  . amitriptyline (ELAVIL) 100 MG tablet    Sig: Take 1 tablet (100 mg total) by mouth at bedtime.    Dispense:  30 tablet    Refill:  1  . divalproex (DEPAKOTE) 250 MG DR tablet    Sig: Take 3 tablets (750 mg  total) by mouth at bedtime.    Dispense:  90 tablet    Refill:  1  . ALPRAZolam (XANAX) 0.5 MG tablet    Sig: Take 1 tablet (0.5 mg total) by mouth 2 (two) times daily. For anxiety    Dispense:  60 tablet    Refill:  1    Medical Decision Making Problem Points:  Established problem, stable/improving (1), Established problem, worsening (2), New problem, with no additional work-up planned (3), Review of last therapy session (1) and Review of psycho-social stressors (1) Data Points:  Review or order clinical lab tests (1) Review and summation of old records (2) Review of medication regiment & side effects (2) Review of new medications or change in dosage (2)  Quinci Gavidia, MD

## 2014-03-20 ENCOUNTER — Telehealth: Payer: Self-pay | Admitting: *Deleted

## 2014-03-20 DIAGNOSIS — L03115 Cellulitis of right lower limb: Secondary | ICD-10-CM

## 2014-03-20 NOTE — Telephone Encounter (Signed)
REFERRAL SENT TO [facility] FOR SECOND OPINION WITH DR Magnus IvanBLACKMAN

## 2014-03-24 NOTE — Telephone Encounter (Signed)
Additional note sent advising patients insurance requires referral from primary care, primary care notified

## 2014-04-13 ENCOUNTER — Telehealth: Payer: Self-pay | Admitting: Orthopedic Surgery

## 2014-04-13 ENCOUNTER — Other Ambulatory Visit: Payer: Self-pay | Admitting: *Deleted

## 2014-04-13 DIAGNOSIS — G894 Chronic pain syndrome: Secondary | ICD-10-CM

## 2014-04-13 MED ORDER — MORPHINE SULFATE ER 10 MG PO CP24: 10.0000 mg | ORAL_CAPSULE | Freq: Every day | ORAL | Status: DC

## 2014-04-13 MED ORDER — OXYCODONE-ACETAMINOPHEN 5-325 MG PO TABS: 1.0000 | ORAL_TABLET | ORAL | Status: DC | PRN

## 2014-04-13 NOTE — Telephone Encounter (Signed)
Patient aware script is available for pick up 

## 2014-04-13 NOTE — Telephone Encounter (Signed)
Katie at [facility] asked if she can get referral notes on Diane [name] faxed to [phone]

## 2014-04-13 NOTE — Telephone Encounter (Signed)
Diane [name] want prescriptions for : 1. Percocet 5/325 2. Morphine 10 mg

## 2014-04-13 NOTE — Telephone Encounter (Signed)
Do you want patient to have both?

## 2014-04-13 NOTE — Telephone Encounter (Signed)
fill

## 2014-04-13 NOTE — Telephone Encounter (Signed)
Notes faxed.

## 2014-04-13 NOTE — Telephone Encounter (Signed)
Patient has an appointment with Dr Magnus IvanBlackman 04/29/14 @ 9:45

## 2014-04-14 NOTE — Telephone Encounter (Signed)
Patient picked up prescription.

## 2014-04-22 ENCOUNTER — Ambulatory Visit (HOSPITAL_COMMUNITY): Payer: Self-pay | Admitting: Psychiatry

## 2014-04-22 ENCOUNTER — Encounter (HOSPITAL_COMMUNITY): Payer: Self-pay | Admitting: Psychiatry

## 2014-05-01 ENCOUNTER — Other Ambulatory Visit (HOSPITAL_COMMUNITY): Payer: Self-pay | Admitting: Orthopaedic Surgery

## 2014-05-01 DIAGNOSIS — T84038A Mechanical loosening of other internal prosthetic joint, initial encounter: Secondary | ICD-10-CM

## 2014-05-01 DIAGNOSIS — Z96659 Presence of unspecified artificial knee joint: Secondary | ICD-10-CM

## 2014-05-08 ENCOUNTER — Encounter (HOSPITAL_COMMUNITY)
Admission: RE | Admit: 2014-05-08 | Discharge: 2014-05-08 | Disposition: A | Discharge status: Home / Self Care | Payer: Medicare HMO | Source: Ambulatory Visit | Attending: Diagnostic Radiology | Admitting: Diagnostic Radiology

## 2014-05-08 ENCOUNTER — Encounter (HOSPITAL_COMMUNITY)
Admission: RE | Admit: 2014-05-08 | Discharge: 2014-05-08 | Disposition: A | Discharge status: Home / Self Care | Payer: Medicare HMO | Source: Ambulatory Visit | Attending: Orthopaedic Surgery | Admitting: Orthopaedic Surgery

## 2014-05-08 DIAGNOSIS — Y929 Unspecified place or not applicable: Secondary | ICD-10-CM | POA: Diagnosis not present

## 2014-05-08 DIAGNOSIS — T84039A Mechanical loosening of unspecified internal prosthetic joint, initial encounter: Secondary | ICD-10-CM | POA: Insufficient documentation

## 2014-05-08 DIAGNOSIS — M7989 Other specified soft tissue disorders: Secondary | ICD-10-CM | POA: Insufficient documentation

## 2014-05-08 DIAGNOSIS — Z96659 Presence of unspecified artificial knee joint: Secondary | ICD-10-CM | POA: Insufficient documentation

## 2014-05-08 DIAGNOSIS — Y831 Surgical operation with implant of artificial internal device as the cause of abnormal reaction of the patient, or of later complication, without mention of misadventure at the time of the procedure: Secondary | ICD-10-CM | POA: Diagnosis not present

## 2014-05-08 DIAGNOSIS — T8489XA Other specified complication of internal orthopedic prosthetic devices, implants and grafts, initial encounter: Secondary | ICD-10-CM | POA: Insufficient documentation

## 2014-05-08 DIAGNOSIS — T84038A Mechanical loosening of other internal prosthetic joint, initial encounter: Secondary | ICD-10-CM

## 2014-05-08 MED ORDER — TECHNETIUM TC 99M MEDRONATE IV KIT
25.0000 | PACK | Freq: Once | INTRAVENOUS | Status: AC | PRN
  Administered 2014-05-08: 25 via INTRAVENOUS

## 2014-05-11 ENCOUNTER — Ambulatory Visit (INDEPENDENT_AMBULATORY_CARE_PROVIDER_SITE_OTHER): Payer: Medicare HMO | Admitting: Psychiatry

## 2014-05-11 ENCOUNTER — Encounter (HOSPITAL_COMMUNITY): Payer: Self-pay | Admitting: Psychiatry

## 2014-05-11 VITALS — BP 144/95 | HR 76 | Ht 63.0 in | Wt 225.6 lb

## 2014-05-11 DIAGNOSIS — F5105 Insomnia due to other mental disorder: Secondary | ICD-10-CM

## 2014-05-11 DIAGNOSIS — F3130 Bipolar disorder, current episode depressed, mild or moderate severity, unspecified: Secondary | ICD-10-CM

## 2014-05-11 DIAGNOSIS — F489 Nonpsychotic mental disorder, unspecified: Secondary | ICD-10-CM

## 2014-05-11 DIAGNOSIS — G894 Chronic pain syndrome: Secondary | ICD-10-CM

## 2014-05-11 MED ORDER — AMITRIPTYLINE HCL 100 MG PO TABS: 100.0000 mg | ORAL_TABLET | Freq: Every day | ORAL | Status: DC

## 2014-05-11 MED ORDER — ALPRAZOLAM 0.5 MG PO TABS: 0.5000 mg | ORAL_TABLET | Freq: Two times a day (BID) | ORAL | Status: DC

## 2014-05-11 MED ORDER — DIVALPROEX SODIUM 250 MG PO DR TAB: 750.0000 mg | DELAYED_RELEASE_TABLET | Freq: Every day | ORAL | Status: DC

## 2014-05-11 NOTE — Progress Notes (Signed)
Patient ID: Diane [name], female   DOB: 1951/05/21, 63 y.o.   MRN: [id number] Patient ID: Diane [name], female   DOB: Jan 04, 1951, 63 y.o.   MRN: [id number] Patient ID: Diane [name], female   DOB: [birth year], 63 y.o.   MRN: [id number] [facility] [address] Progress Note Diane [name] MRN: [id number] DOB: 1951/03/25 Age: 63 y.o.  Date: 05/11/2014  Chief Complaint  Patient presents with  . Anxiety  . Depression  . Follow-up    History of present illness Patient is 63 year old [place] American female who came for her followup appointment. She is currently living alone in [place] and is on disability  The patient was last seen in March. At that time she told me she was moving to [place] to be with her daughter. She didn't like living down there and she and her grandson have come back. Her grandson got his own place to live and she is now staying in a motel temporarily. Her mood is "up and down" because she is unsure where she is going to live. She's in a lot of pain in her knee and would like to get back on the amitriptyline since she ran out. A nurse practitioner and primary care put her on Celexa without realizing that she's already on Cymbalta. I explained to her that made no sense to be on both. She does admit that she still drinks periodically sometimes too much on weekends. I warned her against this. She denies being severely depressed or suicidal  The patient returns after 6 weeks. She claims she's doing okay. She is now living with her niece and the niece's family. She's not thrilled about it. She would like to live on her own. She was reluctant to go to far way from her 32 year old grandson whom she raised. She states her mood is still up and down but she's trying to get on her feet. She has cut her drinking way back and she denies suicidal ideation   Current psychotropic medication Depakote 750 mg at bedtime Amitriptyline 100 mg at bedtime Cymbalta 90 milligrams per  day  Past psychiatric history Patient has history of one [facility] in [facility]. She took overdose on her pills she was also intoxicated with alcohol. Patient has also seen by psychiatrist in 2004. At that time she was seeking more benzodiazepines and pain medication.  Vitals: BP 144/95  Pulse 76  Ht  (1.6 m)  Wt 225 lb 9.6 oz (102.331 kg)  BMI 39.97 kg/m2  Allergies: Allergies  Allergen Reactions  . Neomycin-Bacitracin Zn-Polymyx Other (See Comments)    Reaction: skin starts to become raw and peels off  . Penicillins Hives  . Sulfonamide Derivatives Hives and Swelling   Medical History: Past Medical History  Diagnosis Date  . Hypertension   . Allergic rhinitis   . Anxiety   . Depression   . GERD (gastroesophageal reflux disease)   . High cholesterol   . Hypothyroid   . Chronic back pain   . Arthritis   . Immature cataract   . Sleep apnea     STOP BANG score=4  . Seizures 20 yrs ago    unknown etiology-on meds  . Headaches, cluster 3 yrs ago    last one  . Mania   . 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. PTSD (post-traumatic stress disorder)   Patient has history of hypertension, allergic rhinitis, hypothyroidism, chronic back pain, hyperlipidemia and obesity.  She most  recently had surgery on her right knee.  Surgical History: Past Surgical History  Procedure Laterality Date  . Left foot  x 2  . Foot surgery      left foot-  . Partial hysterectomy    . Oophoectomy  bilateral  . Cholecystectomy    . Lumbar disc surgery  L4-5  . Back surgery    . Knee arthroscopy  01/2012    right  . Total knee arthroplasty  07/01/2012    Procedure: TOTAL KNEE ARTHROPLASTY;  Surgeon: Vickki Hearing, MD;  Location: [facility];  Service: Orthopedics;  Laterality: Right;  . Total knee revision Right 01/03/2013    Procedure: Patellaplasty Right Knee;  Surgeon: Vickki Hearing, MD;  Location: [facility];  Service: Orthopedics;  Laterality:  Right;  . Knee arthroscopy with lateral release Right 01/03/2013    Procedure: Lateral Release Patella Right Knee;  Surgeon: Vickki Hearing, MD;  Location: [facility];  Service: Orthopedics;  Laterality: Right;  . Incision and drainage Right 01/03/2013    Procedure: INCISION AND DRAINAGE;  Surgeon: Vickki Hearing, MD;  Location: [facility];  Service: Orthopedics;  Laterality: Right;  . Esophagogastroduodenoscopy  10/16/2008    ZOX:WRUEA hiatal hernia otherwise normal esophagus, stomach  D1, D2, status post passage of a 56-French Maloney dilator  . Colonoscopy  10/16/2008    VWU:JWJXBJYNWGN, otherwise normal rectum; pancolonic diverticula the remainder of the colonic mucosa appeared normal. Next TCS due 10/2013.  Marland Kitchen Esophagogastroduodenoscopy  06/28/2006    FAO:ZHYQMV esophagus. Small hiatal hernia. Otherwise normal stomach, D1 and D2, status post passage of a 56 [place] Maloney dilator  . Colonoscopy  06/28/2006    RMR: Internal hemorrhoids. Diminutive rectal polyps, cold biopsied/ Pancolonic diverticula. Polyps in the right colon removed   . Esophagogastroduodenoscopy (egd) with esophageal dilation N/A 07/14/2013    Procedure: ESOPHAGOGASTRODUODENOSCOPY (EGD) WITH ESOPHAGEAL DILATION;  Surgeon: Corbin Ade, MD;  [facility];  Service: Endoscopy;  Laterality: N/A;  1:00   Family History: family history includes ADD / ADHD in her grandchild and grandchild; Alcohol abuse in her brother, brother, brother, brother, brother, father, mother, and another family member; Anxiety disorder in her brother, brother, brother, brother, brother, father, and mother; Bipolar disorder in her sister; Dementia in her paternal aunt; Diabetes in an other family member; Drug abuse in her brother and brother; Heart disease in an other family member; Seizures in her brother and brother. There is no history of Depression, OCD, Paranoid behavior, Schizophrenia, Sexual abuse, or Physical abuse.   Psychosocial  history Patient has been married for 16 years. She has 2 children. She is married however her relationship is very tense. She has history of leaving her husband in the past.  Currently she lives by herself.  Mental status examination Patient is mildly obese female who is casually dressed and fairly groomed.  She has difficulty walking due to pain.  She is cooperative and maintained fair eye contact. Her speech is clear and coherent. Her attention and concentration is fair. She denies any active or passive suicidal thinking and homicidal thinking. She's alert and oriented x3. There are no psychotic symptoms present at this time. She denies any auditory or visual hallucination. Her insight judgment is fair and her impulse control is okay.  Lab Results:  Results for orders placed in visit on 03/16/14 (from the past 8736 hour(s))  SEDIMENTATION RATE   Collection Time    03/16/14 10:09 AM      Result Value  Ref Range   Sed Rate 4  0 - 22 mm/hr  CBC   Collection Time    03/16/14 10:09 AM      Result Value Ref Range   WBC 5.0  4.0 - 10.5 K/uL   RBC 4.31  3.87 - 5.11 MIL/uL   Hemoglobin 11.7 (*) 12.0 - 15.0 g/dL   HCT 45.4 (*) 09.8 - 11.9 %   MCV 80.3  78.0 - 100.0 fL   MCH 27.1  26.0 - 34.0 pg   MCHC 33.8  30.0 - 36.0 g/dL   RDW 14.7  82.9 - 56.2 %   Platelets 186  150 - 400 K/uL  C-REACTIVE PROTEIN   Collection Time    03/16/14 10:09 AM      Result Value Ref Range   CRP <0.5  <0.60 mg/dL   Assessment Axis I bipolar disorder NOS, benzodiazepine abuse, alcohol abuse Axis II deferred Axis III see medical history Axis IV moderate Axis V 60-65  Plan/Discussion: . For now she can continue the Cymbalta Elavil and Depakote and low-dose Xanax She'll return to see me in 2 months. She agrees to resume therapy with Florencia Reasons Time spent 25 minutes.  More than 50% of the time spent in psychoeducation, counseling and coordination of care.  Discuss safety plan that anytime having active suicidal  thoughts or homicidal thoughts then patient need to call 911 or go to the local emergency room.  MEDICATIONS this encounter: Meds ordered this encounter  Medications  . morphine (KADIAN) 10 MG 24 hr capsule    Sig: Take 10 mg by mouth 2 (two) times daily.  Marland Kitchen amitriptyline (ELAVIL) 100 MG tablet    Sig: Take 1 tablet (100 mg total) by mouth at bedtime.    Dispense:  30 tablet    Refill:  1  . divalproex (DEPAKOTE) 250 MG DR tablet    Sig: Take 3 tablets (750 mg total) by mouth at bedtime.    Dispense:  90 tablet    Refill:  1  . ALPRAZolam (XANAX) 0.5 MG tablet    Sig: Take 1 tablet (0.5 mg total) by mouth 2 (two) times daily. For anxiety    Dispense:  60 tablet    Refill:  1    Medical Decision Making Problem Points:  Established problem, stable/improving (1), Established problem, worsening (2), New problem, with no additional work-up planned (3), Review of last therapy session (1) and Review of psycho-social stressors (1) Data Points:  Review or order clinical lab tests (1) Review and summation of old records (2) Review of medication regiment & side effects (2) Review of new medications or change in dosage (2)  Dean Wonder, MD

## 2014-05-12 ENCOUNTER — Telehealth: Payer: Self-pay | Admitting: Orthopedic Surgery

## 2014-05-12 NOTE — Telephone Encounter (Signed)
Diane [name] asked for prescriptions for Percocet 5/325 and Morphine . She saw Dr. Magnus Ivan 2 weeks ago and has another appointment 05/13/14. Does she need to get the pain medicines from Dr. Magnus Ivan?

## 2014-05-12 NOTE — Telephone Encounter (Signed)
Routing to dr harrison 

## 2014-05-13 NOTE — Telephone Encounter (Signed)
No not unless he operates

## 2014-05-14 ENCOUNTER — Other Ambulatory Visit: Payer: Self-pay | Admitting: *Deleted

## 2014-05-14 DIAGNOSIS — G894 Chronic pain syndrome: Secondary | ICD-10-CM

## 2014-05-14 MED ORDER — OXYCODONE-ACETAMINOPHEN 5-325 MG PO TABS: 1.0000 | ORAL_TABLET | ORAL | Status: DC | PRN

## 2014-05-14 MED ORDER — MORPHINE SULFATE ER 10 MG PO CP24: 10.0000 mg | ORAL_CAPSULE | Freq: Every day | ORAL | Status: DC

## 2014-05-14 NOTE — Telephone Encounter (Signed)
Prescriptions available for pickup, called patient, no answer, left vm

## 2014-06-02 ENCOUNTER — Ambulatory Visit (HOSPITAL_COMMUNITY): Payer: Medicare HMO | Admitting: Psychiatry

## 2014-06-09 ENCOUNTER — Other Ambulatory Visit: Payer: Self-pay | Admitting: *Deleted

## 2014-06-09 ENCOUNTER — Telehealth: Payer: Self-pay | Admitting: Orthopedic Surgery

## 2014-06-09 DIAGNOSIS — G894 Chronic pain syndrome: Secondary | ICD-10-CM

## 2014-06-09 MED ORDER — OXYCODONE-ACETAMINOPHEN 5-325 MG PO TABS: 1.0000 | ORAL_TABLET | ORAL | Status: DC | PRN

## 2014-06-09 NOTE — Telephone Encounter (Signed)
Patient is calling asking for pain meds to be refilled for Right Knee Pain, asking for oxyCODONE-acetaminophen (PERCOCET/ROXICET) 5-325 MG [facility] [place], Please Advise?

## 2014-06-09 NOTE — Telephone Encounter (Signed)
Prescription available, patient aware  

## 2014-06-10 ENCOUNTER — Other Ambulatory Visit: Payer: Self-pay | Admitting: *Deleted

## 2014-06-10 MED ORDER — MORPHINE SULFATE ER 10 MG PO CP24
10.0000 mg | ORAL_CAPSULE | Freq: Every day | ORAL | Status: DC
Start: 2014-06-10 — End: 2014-07-08

## 2014-06-10 NOTE — Telephone Encounter (Signed)
yes

## 2014-06-10 NOTE — Telephone Encounter (Signed)
Are you still prescribing both?

## 2014-06-10 NOTE — Telephone Encounter (Signed)
Patient picked up Rx for Oxycodone-acetaminophen but she is asking also for Rx for Morphine, please advise?

## 2014-06-11 ENCOUNTER — Encounter (HOSPITAL_COMMUNITY): Payer: Self-pay | Admitting: Psychology

## 2014-06-11 ENCOUNTER — Ambulatory Visit (INDEPENDENT_AMBULATORY_CARE_PROVIDER_SITE_OTHER): Payer: Medicare HMO | Admitting: Psychology

## 2014-06-11 DIAGNOSIS — F489 Nonpsychotic mental disorder, unspecified: Secondary | ICD-10-CM

## 2014-06-11 DIAGNOSIS — G894 Chronic pain syndrome: Secondary | ICD-10-CM

## 2014-06-11 DIAGNOSIS — F3130 Bipolar disorder, current episode depressed, mild or moderate severity, unspecified: Secondary | ICD-10-CM

## 2014-06-11 DIAGNOSIS — F3162 Bipolar disorder, current episode mixed, moderate: Secondary | ICD-10-CM

## 2014-06-11 DIAGNOSIS — F5105 Insomnia due to other mental disorder: Secondary | ICD-10-CM

## 2014-06-11 NOTE — Progress Notes (Signed)
    PROGRESS NOTE  Patient:  Diane [name]   DOB: 30-Aug-1951  MR Number: [id number]  [facility] [address] [place] [address] Dept: [phone]  Start: 10 AM End: 11 AM  Provider/Observer:     Hershal CoriaJohn R Rodenbough PSYD  Chief Complaint:      Chief Complaint  Patient presents with  . Agitation  . Stress  . Anxiety    Reason For Service:     the patient initially came in because of issues of increasing symptoms of depression, anxiety and mood swings. She was followed often on for the last several through our office and continues to see Dr. Tenny Crawoss for psychiatric interventions. The patient continues to have a lot of coping issues and depression as well as other mood swings but denies suicidal ideation but still times that she feels helpless and hopeless.  The patient has lost close friends and has had trouble creating new connections.  Family stress, especially with grandson has been issues.  SHe moved to [place] for 3 months to help him but has returned and struggled to get a new place to live.  She is working on that..  Interventions Strategy:  Cognitive/behavioral psychotherapeutic interventions  Participation Level:   Active  Participation Quality:  Appropriate      Behavioral Observation:  Well Groomed, Alert, and Depressed.   Current Psychosocial Factors: The patient reports that she is continuing to have coping issues with her bipolar disorder.  Usually depressive swings but some manic/hypomanic symptoms.  Content of Session:   Review current symptoms and continue to work on therapeutic interventions to build better coping skills and strategies.  Current Status:   The patient does report continued significant reduction of alcohol with no drinking at all during the week.   and overall her mood has been more depressed and she is realizing that alcohol plays a role in some  of her later mood instabilities.  Patient Progress:   The patient had been experiencing worse depression.  Target Goals:   Target goals include dealing with significant issues around a mood disorder that predominately is a depressive in nature but also has times of hypomanic episodes. Working on Pharmacologistcoping skills to facilitate mood stability are primary. More recently, buried in issues and other depression have also been present and these may have a worsening effect on her underlying psychiatric condition.  Last Reviewed:   06/11/2014  Goals Addressed Today:    We worked on building and continuing to develop coping skills around her underlying mood disorder as well as her recurrent use of alcohol which exacerbates her symptoms.  The patient has substantially reduced any alcohol use and this has helped her with some of the agitation she has been experiencing and she has noted this improvement..  Impression/Diagnosis:   the patient had a long history of mood disorder including mood swings into stages of depression. She also has used alcohol as a therapeutic intervention.  Diagnosis:    Axis I:  Chronic pain syndrome  Bipolar 1 disorder, mixed, moderate  Insomnia due to mental disorder      Axis II: No diagnosis     RODENBOUGH,JOHN R, PsyD 06/11/2014

## 2014-06-11 NOTE — Telephone Encounter (Signed)
Prescription available, patient aware  

## 2014-06-15 NOTE — Telephone Encounter (Signed)
Patient picked Rx up

## 2014-07-02 ENCOUNTER — Encounter (HOSPITAL_COMMUNITY): Payer: Self-pay | Admitting: *Deleted

## 2014-07-02 ENCOUNTER — Ambulatory Visit: Payer: Medicare HMO | Admitting: Orthopedic Surgery

## 2014-07-02 NOTE — Progress Notes (Signed)
Prior Auth [id number] Duloxetine HCL DR (Cymbalta) 30 mg Cap, 90/30  Approved from 07-01-14 to 07-02-15

## 2014-07-07 ENCOUNTER — Ambulatory Visit: Payer: Medicare HMO | Admitting: Orthopedic Surgery

## 2014-07-07 ENCOUNTER — Telehealth: Payer: Self-pay | Admitting: Orthopedic Surgery

## 2014-07-07 NOTE — Telephone Encounter (Signed)
refill 

## 2014-07-07 NOTE — Telephone Encounter (Signed)
Patient had been scheduled for today, 07/07/14, however, states she had the wrong date, and has re-scheduled for next available yearly Xray slot, 07/28/14.    She is requesting refill on pain medication(s): morphine (KADIAN) 10 MG 24 hr capsule [[id number]][[id number]] and oxyCODONE-acetaminophen (PERCOCET/ROXICET) 5-325 MG per tablet [[id number]][[id number]]. Her phone # is [phone]

## 2014-07-07 NOTE — Telephone Encounter (Signed)
Routing to dr Harrison 

## 2014-07-08 ENCOUNTER — Other Ambulatory Visit: Payer: Self-pay | Admitting: *Deleted

## 2014-07-08 DIAGNOSIS — G894 Chronic pain syndrome: Secondary | ICD-10-CM

## 2014-07-08 MED ORDER — MORPHINE SULFATE ER 10 MG PO CP24: 10.0000 mg | ORAL_CAPSULE | Freq: Every day | ORAL | Status: DC

## 2014-07-08 MED ORDER — OXYCODONE-ACETAMINOPHEN 5-325 MG PO TABS: 1.0000 | ORAL_TABLET | ORAL | Status: DC | PRN

## 2014-07-08 NOTE — Telephone Encounter (Signed)
PRESCRIPTION AVAILABLE FOR PICK UP, CALLED PATIENT, NO ANSWER 

## 2014-07-10 ENCOUNTER — Ambulatory Visit (INDEPENDENT_AMBULATORY_CARE_PROVIDER_SITE_OTHER): Payer: Medicare HMO | Admitting: Psychiatry

## 2014-07-10 ENCOUNTER — Encounter: Payer: Self-pay | Admitting: Psychiatry

## 2014-07-10 ENCOUNTER — Encounter (HOSPITAL_COMMUNITY): Payer: Self-pay | Admitting: Psychiatry

## 2014-07-10 VITALS — BP 161/94 | HR 75 | Ht 63.0 in | Wt 225.8 lb

## 2014-07-10 DIAGNOSIS — F1019 Alcohol abuse with unspecified alcohol-induced disorder: Secondary | ICD-10-CM

## 2014-07-10 DIAGNOSIS — F1310 Sedative, hypnotic or anxiolytic abuse, uncomplicated: Secondary | ICD-10-CM

## 2014-07-10 DIAGNOSIS — F5105 Insomnia due to other mental disorder: Secondary | ICD-10-CM

## 2014-07-10 DIAGNOSIS — F39 Unspecified mood [affective] disorder: Secondary | ICD-10-CM

## 2014-07-10 DIAGNOSIS — F3162 Bipolar disorder, current episode mixed, moderate: Secondary | ICD-10-CM

## 2014-07-10 DIAGNOSIS — F3130 Bipolar disorder, current episode depressed, mild or moderate severity, unspecified: Secondary | ICD-10-CM

## 2014-07-10 DIAGNOSIS — G894 Chronic pain syndrome: Secondary | ICD-10-CM

## 2014-07-10 MED ORDER — ALPRAZOLAM 0.5 MG PO TABS: 0.5000 mg | ORAL_TABLET | Freq: Two times a day (BID) | ORAL | Status: DC

## 2014-07-10 MED ORDER — DULOXETINE HCL 30 MG PO CPEP: ORAL_CAPSULE | ORAL | Status: DC

## 2014-07-10 MED ORDER — DIVALPROEX SODIUM 250 MG PO DR TAB: 750.0000 mg | DELAYED_RELEASE_TABLET | Freq: Every day | ORAL | Status: DC

## 2014-07-10 MED ORDER — AMITRIPTYLINE HCL 100 MG PO TABS: 100.0000 mg | ORAL_TABLET | Freq: Every day | ORAL | Status: DC

## 2014-07-10 NOTE — Progress Notes (Signed)
Patient ID: NATORI GUDINO, female   DOB: Jun 03, 1951, 63 y.o.   MRN: [id number] Patient ID: CHONDRA BOYDE, female   DOB: Mar 04, 1951, 63 y.o.   MRN: [id number] Patient ID: GLADIOLA MADORE, female   DOB: 06-17-1951, 63 y.o.   MRN: [id number] Patient ID: JAMISON YUHASZ, female   DOB: 06/19/1951, 63 y.o.   MRN: [id number] [facility] [id number] Progress Note CAROLYNE WHITSEL MRN: [id number] DOB: 04/17/1951 Age: 63 y.o.  Date: 07/10/2014  Chief Complaint  Patient presents with  . Anxiety  . Depression  . Follow-up    History of present illness Patient is 63 year old [place] American female who came for her followup appointment. She is currently with her neice's family in [place] and is on disability  The patient was last seen in March. At that time she told me she was moving to [place] to be with her daughter. She didn't like living down there and she and her grandson have come back. Her grandson got his own place to live and she is now staying in a motel temporarily. Her mood is "up and down" because she is unsure where she is going to live. She's in a lot of pain in her knee and would like to get back on the amitriptyline since she ran out. A nurse practitioner and primary care put her on Celexa without realizing that she's already on Cymbalta. I explained to her that made no sense to be on both. She does admit that she still drinks periodically sometimes too much on weekends. I warned her against this. She denies being severely depressed or suicidal  The patient returns after 2 months. She is more depressed. She doesn't like where she is living. She feels unappreciated and she's having a lot of conflicts with her knees. She is drinking more heavily than average and states she is drinking half a pint of hard liquor every couple of days. I explained that I could not continue to prescribe drugs like Xanax and Depakote while she is drinking. She claims that she will stop. She's not sleeping well this  is probably also due to her alcohol intake. She is depressed but denies suicidal ideation and I explained that I couldn't do much for her if she continues to drink to this degree. We will get a urine drug screen Depakote level and liver panel today   Current psychotropic medication Depakote 750 mg at bedtime Amitriptyline 100 mg at bedtime Cymbalta 90 milligrams per day  Past psychiatric history Patient has history of one psychiatric admission in [facility]. She took overdose on her pills she was also intoxicated with alcohol. Patient has also seen by psychiatrist in 2004. At that time she was seeking more benzodiazepines and pain medication.  Vitals: BP 161/94  Pulse 75  Ht 5\' 3"  (1.6 m)  Wt 225 lb 12.8 oz (102.422 kg)  BMI 40.01 kg/m2  Allergies: Allergies  Allergen Reactions  . Neomycin-Bacitracin Zn-Polymyx Other (See Comments)    Reaction: skin starts to become raw and peels off  . Penicillins Hives  . Sulfonamide Derivatives Hives and Swelling   Medical History: Past Medical History  Diagnosis Date  . Hypertension   . Allergic rhinitis   . Anxiety   . Depression   . GERD (gastroesophageal reflux disease)   . High cholesterol   . Hypothyroid   . Chronic back pain   . Arthritis   . Immature cataract   . Sleep apnea  STOP BANG score=4  . Seizures 20 yrs ago    unknown etiology-on meds  . Headaches, cluster 3 yrs ago    last one  . Mania   . 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. PTSD (post-traumatic stress disorder)   Patient has history of hypertension, allergic rhinitis, hypothyroidism, chronic back pain, hyperlipidemia and obesity.  She most recently had surgery on her right knee.  Surgical History: Past Surgical History  Procedure Laterality Date  . Left foot  x 2  . Foot surgery      left foot-  . Partial hysterectomy    . Oophoectomy  bilateral  . Cholecystectomy    . Lumbar disc surgery  L4-5  . Back surgery    . Knee  arthroscopy  01/2012    right  . Total knee arthroplasty  07/01/2012    Procedure: TOTAL KNEE ARTHROPLASTY;  Surgeon: Vickki HearingStanley E Harrison, MD;  Location: [facility];  Service: Orthopedics;  Laterality: Right;  . Total knee revision Right 01/03/2013    Procedure: Patellaplasty Right Knee;  Surgeon: Vickki HearingStanley E Harrison, MD;  Location: [facility];  Service: Orthopedics;  Laterality: Right;  . Knee arthroscopy with lateral release Right 01/03/2013    Procedure: Lateral Release Patella Right Knee;  Surgeon: Vickki HearingStanley E Harrison, MD;  Location: [facility];  Service: Orthopedics;  Laterality: Right;  . Incision and drainage Right 01/03/2013    Procedure: INCISION AND DRAINAGE;  Surgeon: Vickki HearingStanley E Harrison, MD;  Location: [facility];  Service: Orthopedics;  Laterality: Right;  . Esophagogastroduodenoscopy  10/16/2008    ONG:EXBMWRMR:Small hiatal hernia otherwise normal esophagus, stomach  D1, D2, status post passage of a 56-French Maloney dilator  . Colonoscopy  10/16/2008    UXL:[id number]:Hemorrhoids, otherwise normal rectum; pancolonic diverticula the remainder of the colonic mucosa appeared normal. Next TCS due 10/2013.  Marland Kitchen. Esophagogastroduodenoscopy  06/28/2006    GUY:QIHKVQRMR:Normal esophagus. Small hiatal hernia. Otherwise normal stomach, D1 and D2, status post passage of a 56 [place] Maloney dilator  . Colonoscopy  06/28/2006    RMR: Internal hemorrhoids. Diminutive rectal polyps, cold biopsied/ Pancolonic diverticula. Polyps in the right colon removed   . Esophagogastroduodenoscopy (egd) with esophageal dilation N/A 07/14/2013    Procedure: ESOPHAGOGASTRODUODENOSCOPY (EGD) WITH ESOPHAGEAL DILATION;  Surgeon: Corbin Adeobert M Rourk, MD;  Location: [facility];  Service: Endoscopy;  Laterality: N/A;  1:00   Family History: family history includes ADD / ADHD in her grandchild and grandchild; Alcohol abuse in her brother, brother, brother, brother, brother, father, mother, and another family member; Anxiety disorder in her brother, brother, brother, brother,  brother, father, and mother; Bipolar disorder in her sister; Dementia in her paternal aunt; Diabetes in an other family member; Drug abuse in her brother and brother; Heart disease in an other family member; Seizures in her brother and brother. There is no history of Depression, OCD, Paranoid behavior, Schizophrenia, Sexual abuse, or Physical abuse.   Psychosocial history Patient has been married for 16 years. She has 2 children. She is married however her relationship is very tense. She has history of leaving her husband in the past.  Currently she lives by herself.  Mental status examination Patient is mildly obese female who is casually dressed and fairly groomed.  She has difficulty walking due to pain.  She is cooperative and maintained fair eye contact her mood is depressed and her affect is constricted and she is somewhat irritable. She denies suicidal ideation but claims that she feels "useless. Her speech is clear and  coherent. Her attention and concentration is fair. . She's alert and oriented x3. There are no psychotic symptoms present at this time. She denies any auditory or visual hallucination. Her insight judgment is fair and her impulse control is okay.  Lab Results:  Results for orders placed in visit on 03/16/14 (from the past 8736 hour(s))  SEDIMENTATION RATE   Collection Time    03/16/14 10:09 AM      Result Value Ref Range   Sed Rate 4  0 - 22 mm/hr  CBC   Collection Time    03/16/14 10:09 AM      Result Value Ref Range   WBC 5.0  4.0 - 10.5 K/uL   RBC 4.31  3.87 - 5.11 MIL/uL   Hemoglobin 11.7 (*) 12.0 - 15.0 g/dL   HCT 40.934.6 (*) 81.136.0 - 91.446.0 %   MCV 80.3  78.0 - 100.0 fL   MCH 27.1  26.0 - 34.0 pg   MCHC 33.8  30.0 - 36.0 g/dL   RDW 78.213.9  95.611.5 - 21.315.5 %   Platelets 186  150 - 400 K/uL  C-REACTIVE PROTEIN   Collection Time    03/16/14 10:09 AM      Result Value Ref Range   CRP <0.5  <0.60 mg/dL   Assessment Axis I bipolar disorder NOS, benzodiazepine abuse,  alcohol abuse Axis II deferred Axis III see medical history Axis IV moderate Axis V 60-65  Plan/Discussion: . For now she can continue the Cymbalta Elavil and Depakote and low-dose Xanax. We will get a urine drug screen Depakote level and liver function tests She'll return to see me in 4 weeks She agrees to  Time spent 15 minutes.  More than 50% of the time spent in psychoeducation, counseling and coordination of care.  Discuss safety plan that anytime having active suicidal thoughts or homicidal thoughts then patient need to call 911 or go to the local emergency room.  MEDICATIONS this encounter: Meds ordered this encounter  Medications  . DULoxetine (CYMBALTA) 30 MG capsule    Sig: Take 1 in am and 2 at bed time    Dispense:  90 capsule    Refill:  2  . divalproex (DEPAKOTE) 250 MG DR tablet    Sig: Take 3 tablets (750 mg total) by mouth at bedtime.    Dispense:  90 tablet    Refill:  1  . amitriptyline (ELAVIL) 100 MG tablet    Sig: Take 1 tablet (100 mg total) by mouth at bedtime.    Dispense:  30 tablet    Refill:  1  . ALPRAZolam (XANAX) 0.5 MG tablet    Sig: Take 1 tablet (0.5 mg total) by mouth 2 (two) times daily. For anxiety    Dispense:  60 tablet    Refill:  1    Medical Decision Making Problem Points:  Established problem, stable/improving (1), Established problem, worsening (2), New problem, with no additional work-up planned (3), Review of last therapy session (1) and Review of psycho-social stressors (1) Data Points:  Review or order clinical lab tests (1) Review and summation of old records (2) Review of medication regiment & side effects (2) Review of new medications or change in dosage (2)  Lyal Husted, MD

## 2014-07-11 LAB — HEPATIC FUNCTION PANEL
ALT: 10 U/L (ref 0–35)
AST: 15 U/L (ref 0–37)
Albumin: 3.6 g/dL (ref 3.5–5.2)
Alkaline Phosphatase: 60 U/L (ref 39–117)
Bilirubin, Direct: 0.1 mg/dL (ref 0.0–0.3)
Indirect Bilirubin: 0.2 mg/dL (ref 0.2–1.2)
Total Bilirubin: 0.3 mg/dL (ref 0.2–1.2)
Total Protein: 6.8 g/dL (ref 6.0–8.3)

## 2014-07-11 LAB — VALPROIC ACID LEVEL: Valproic Acid Lvl: 24 ug/mL — ABNORMAL LOW (ref 50.0–100.0)

## 2014-07-13 NOTE — Telephone Encounter (Signed)
Picked up.

## 2014-07-15 ENCOUNTER — Ambulatory Visit (INDEPENDENT_AMBULATORY_CARE_PROVIDER_SITE_OTHER): Payer: Medicare HMO | Admitting: Psychology

## 2014-07-15 ENCOUNTER — Encounter (HOSPITAL_COMMUNITY): Payer: Self-pay | Admitting: Psychology

## 2014-07-15 DIAGNOSIS — G894 Chronic pain syndrome: Secondary | ICD-10-CM

## 2014-07-15 DIAGNOSIS — F3162 Bipolar disorder, current episode mixed, moderate: Secondary | ICD-10-CM

## 2014-07-15 NOTE — Progress Notes (Signed)
    PROGRESS NOTE  Patient:  Diane [name]   DOB: 21-Jul-1951  MR Number: [id number]  [facility]: [facility] [address] [place] [address] Dept: [phone]  Start: 10 AM End: 11 AM  Provider/Observer:     Hershal CoriaJohn R Keshav Winegar PSYD  Chief Complaint:      Chief Complaint  Patient presents with  . Anxiety  . Depression  . Stress  . Trauma    Reason For Service:     the patient initially came in because of issues of increasing symptoms of depression, anxiety and mood swings. She was followed often on for the last several through our office and continues to see Dr. Tenny Crawoss for psychiatric interventions. The patient continues to have a lot of coping issues and depression as well as other mood swings but denies suicidal ideation but still times that she feels helpless and hopeless.  The patient has lost close friends and has had trouble creating new connections.  Family stress, especially with grandson has been issues.  SHe moved to [place] for 3 months to help him but has returned and struggled to get a new place to live.  She is working on that..  Interventions Strategy:  Cognitive/behavioral psychotherapeutic interventions  Participation Level:   Active  Participation Quality:  Appropriate      Behavioral Observation:  Well Groomed, Alert, and Depressed.   Current Psychosocial Factors: The patient reports that she is continuing to have coping issues with her bipolar disorder.  Reduced alcohol to about two nights per month.  Content of Session:   Review current symptoms and continue to work on therapeutic interventions to build better coping skills and strategies.  Current Status:   The patient does report continued significant reduction of alcohol with no drinking at all during the week.   and overall her mood has been more depressed and she is realizing that alcohol plays a role in some  of her later mood instabilities.  Patient Progress:   The patient had been experiencing worse depression.  Target Goals:   Target goals include dealing with significant issues around a mood disorder that predominately is a depressive in nature but also has times of hypomanic episodes. Working on Pharmacologistcoping skills to facilitate mood stability are primary. More recently, buried in issues and other depression have also been present and these may have a worsening effect on her underlying psychiatric condition.  Last Reviewed:   07/15/2014  Goals Addressed Today:    We worked on building and continuing to develop coping skills around her underlying mood disorder as well as her recurrent use of alcohol which exacerbates her symptoms.  The patient has substantially reduced any alcohol use and this has helped her with some of the agitation she has been experiencing and she has noted this improvement..  Impression/Diagnosis:   the patient had a long history of mood disorder including mood swings into stages of depression. She also has used alcohol as a therapeutic intervention.  Diagnosis:    Axis I:  Bipolar 1 disorder, mixed, moderate  Chronic pain syndrome      Axis II: No diagnosis     [name], PsyD 07/15/2014

## 2014-07-28 ENCOUNTER — Ambulatory Visit (INDEPENDENT_AMBULATORY_CARE_PROVIDER_SITE_OTHER): Payer: Medicare HMO

## 2014-07-28 ENCOUNTER — Ambulatory Visit (INDEPENDENT_AMBULATORY_CARE_PROVIDER_SITE_OTHER): Payer: Medicare HMO | Admitting: Orthopedic Surgery

## 2014-07-28 ENCOUNTER — Encounter: Payer: Self-pay | Admitting: Orthopedic Surgery

## 2014-07-28 DIAGNOSIS — M129 Arthropathy, unspecified: Secondary | ICD-10-CM

## 2014-07-28 DIAGNOSIS — Z96651 Presence of right artificial knee joint: Secondary | ICD-10-CM

## 2014-07-28 DIAGNOSIS — M1711 Unilateral primary osteoarthritis, right knee: Secondary | ICD-10-CM

## 2014-07-28 MED ORDER — OXYCODONE-ACETAMINOPHEN 7.5-325 MG PO TABS: 1.0000 | ORAL_TABLET | Freq: Four times a day (QID) | ORAL | Status: DC | PRN

## 2014-07-28 NOTE — Progress Notes (Signed)
Patient ID: Deretha Emoryris M Mattice, female   DOB: June 22, 1951, 63 y.o.   MRN: [id number] Chief Complaint  Patient presents with  . 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  No tenderness mild swelling she has some tenderness in her thigh from a recent contusion where she ran into a piece of furniture. She has a slightly weakened straight leg raise the skin is intact distal peripheral edema mild pulses intact sensation normal  Today's x-rays show stable implant without loosening  Impression chronic pain Encounter Diagnoses  Name Primary?  Marland Kitchen. Arthritis of knee, right Yes  . Status post total right knee replacement     Orders Placed This Encounter  Procedures  . DG Knee AP/LAT W/Sunrise Right   Meds ordered this encounter  Medications  . NITROGLYCERIN TRANSDERMAL TD    Sig: Place onto the skin.  Marland Kitchen. oxyCODONE-acetaminophen (PERCOCET) 7.5-325 MG per tablet    Sig: Take 1 tablet by mouth every 6 (six) hours as needed for pain.    Dispense:  120 tablet    Refill:  0

## 2014-08-10 ENCOUNTER — Ambulatory Visit (HOSPITAL_COMMUNITY): Payer: Self-pay | Admitting: Psychiatry

## 2014-08-14 ENCOUNTER — Ambulatory Visit (INDEPENDENT_AMBULATORY_CARE_PROVIDER_SITE_OTHER): Payer: Medicare HMO | Admitting: Psychology

## 2014-08-14 DIAGNOSIS — F3162 Bipolar disorder, current episode mixed, moderate: Secondary | ICD-10-CM

## 2014-08-14 DIAGNOSIS — G894 Chronic pain syndrome: Secondary | ICD-10-CM

## 2014-08-20 ENCOUNTER — Encounter (HOSPITAL_COMMUNITY): Payer: Self-pay | Admitting: Psychology

## 2014-08-20 NOTE — Progress Notes (Signed)
     PROGRESS NOTE  Patient:  Diane [name]   DOB: Feb 12, 1951  MR Number: [id number]  [facility] [address] [place] [address] Dept: [phone]  Start: 2 PM End: 3 PM  Provider/Observer:     Hershal CoriaJohn R Tiant Peixoto PSYD  Chief Complaint:      Chief Complaint  Patient presents with  . Anxiety  . Depression  . Stress    Reason For Service:     the patient initially came in because of issues of increasing symptoms of depression, anxiety and mood swings. She was followed often on for the last several through our office and continues to see Dr. Tenny Crawoss for psychiatric interventions. The patient continues to have a lot of coping issues and depression as well as other mood swings but denies suicidal ideation but still times that she feels helpless and hopeless.  The patient has lost close friends and has had trouble creating new connections.  Family stress, especially with grandson has been issues.  SHe moved to [place] for 3 months to help him but has returned and struggled to get a new place to live.  She is working on that..  Interventions Strategy:  Cognitive/behavioral psychotherapeutic interventions  Participation Level:   Active  Participation Quality:  Appropriate      Behavioral Observation:  Well Groomed, Alert, and Depressed.   Current Psychosocial Factors: The patient reports that she is continuing to have coping issues with her bipolar disorder.  Reduced alcohol to about two nights per month.  She reports that this reduction of alcohol is continued but does continue to drink at times in the patient reports that she has been trying to work on trying to keep people that are destructive to her life away.  Content of Session:   Review current symptoms and continue to work on therapeutic interventions to build better coping skills and strategies.  Current Status:   The  patient does report continued significant reduction of alcohol with no drinking at all during the week.   and overall her mood has been more depressed and she is realizing that alcohol plays a role in some of her later mood instabilities.  Patient Progress:   The patient had been experiencing worse depression.  Target Goals:   Target goals include dealing with significant issues around a mood disorder that predominately is a depressive in nature but also has times of hypomanic episodes. Working on Pharmacologistcoping skills to facilitate mood stability are primary. More recently, buried in issues and other depression have also been present and these may have a worsening effect on her underlying psychiatric condition.  Last Reviewed:   08/14/2014  Goals Addressed Today:    We worked on building and continuing to develop coping skills around her underlying mood disorder as well as her recurrent use of alcohol which exacerbates her symptoms.  The patient has substantially reduced any alcohol use and this has helped her with some of the agitation she has been experiencing and she has noted this improvement..  Impression/Diagnosis:   the patient had a long history of mood disorder including mood swings into stages of depression. She also has used alcohol as a therapeutic intervention.  Diagnosis:    Axis I:  Bipolar 1 disorder, mixed, moderate  Chronic pain syndrome      Axis II: No diagnosis     Carrington Mullenax R, PsyD 08/20/2014

## 2014-08-24 ENCOUNTER — Other Ambulatory Visit: Payer: Self-pay | Admitting: *Deleted

## 2014-08-24 ENCOUNTER — Telehealth: Payer: Self-pay | Admitting: Orthopedic Surgery

## 2014-08-24 DIAGNOSIS — M1711 Unilateral primary osteoarthritis, right knee: Secondary | ICD-10-CM

## 2014-08-24 MED ORDER — OXYCODONE-ACETAMINOPHEN 7.5-325 MG PO TABS: 1.0000 | ORAL_TABLET | Freq: Four times a day (QID) | ORAL | Status: DC | PRN

## 2014-08-24 NOTE — Telephone Encounter (Signed)
Patient called to request refill of pain medication: oxyCODONE-acetaminophen (PERCOCET) 7.5-325 MG per tablet [[id number]][[id number]]  Her ph# is [phone]

## 2014-08-25 NOTE — Telephone Encounter (Signed)
Prescription available, patient aware  

## 2014-08-25 NOTE — Telephone Encounter (Signed)
Patient picked up Rx

## 2014-09-16 ENCOUNTER — Ambulatory Visit (HOSPITAL_COMMUNITY): Payer: Self-pay | Admitting: Psychology

## 2014-09-18 ENCOUNTER — Encounter (HOSPITAL_COMMUNITY): Payer: Self-pay | Admitting: Psychology

## 2014-09-23 ENCOUNTER — Telehealth: Payer: Self-pay | Admitting: Orthopedic Surgery

## 2014-09-23 ENCOUNTER — Other Ambulatory Visit: Payer: Self-pay | Admitting: *Deleted

## 2014-09-23 DIAGNOSIS — M1711 Unilateral primary osteoarthritis, right knee: Secondary | ICD-10-CM

## 2014-09-23 MED ORDER — OXYCODONE-ACETAMINOPHEN 7.5-325 MG PO TABS: 1.0000 | ORAL_TABLET | Freq: Four times a day (QID) | ORAL | Status: DC | PRN

## 2014-09-23 NOTE — Telephone Encounter (Signed)
Patient is requesting a pain medication refill oxyCODONE-acetaminophen (PERCOCET) 7.5-325 MG per tablet please advise?

## 2014-09-24 NOTE — Telephone Encounter (Signed)
Prescription is available for pick up, patient is aware  

## 2014-09-24 NOTE — Telephone Encounter (Signed)
Patient picked up Rx

## 2014-10-08 ENCOUNTER — Other Ambulatory Visit (HOSPITAL_COMMUNITY): Payer: Self-pay | Admitting: Psychiatry

## 2014-10-22 ENCOUNTER — Telehealth: Payer: Self-pay | Admitting: Orthopedic Surgery

## 2014-10-22 ENCOUNTER — Other Ambulatory Visit: Payer: Self-pay | Admitting: *Deleted

## 2014-10-22 DIAGNOSIS — M1711 Unilateral primary osteoarthritis, right knee: Secondary | ICD-10-CM

## 2014-10-22 MED ORDER — OXYCODONE-ACETAMINOPHEN 7.5-325 MG PO TABS: 1.0000 | ORAL_TABLET | Freq: Four times a day (QID) | ORAL | Status: DC | PRN

## 2014-10-22 NOTE — Telephone Encounter (Signed)
Patient is calling requesting refill on pain medication oxyCODONE-acetaminophen (PERCOCET) 7.5-325 MG per tablet [[id number]][[id number]] please advise?

## 2014-10-23 ENCOUNTER — Ambulatory Visit (INDEPENDENT_AMBULATORY_CARE_PROVIDER_SITE_OTHER): Payer: Medicare HMO | Admitting: Psychiatry

## 2014-10-23 ENCOUNTER — Encounter (HOSPITAL_COMMUNITY): Payer: Self-pay | Admitting: Psychiatry

## 2014-10-23 VITALS — BP 127/63 | HR 80 | Ht 63.0 in | Wt 223.0 lb

## 2014-10-23 DIAGNOSIS — F5105 Insomnia due to other mental disorder: Secondary | ICD-10-CM

## 2014-10-23 DIAGNOSIS — G894 Chronic pain syndrome: Secondary | ICD-10-CM

## 2014-10-23 DIAGNOSIS — F1099 Alcohol use, unspecified with unspecified alcohol-induced disorder: Secondary | ICD-10-CM

## 2014-10-23 DIAGNOSIS — F1310 Sedative, hypnotic or anxiolytic abuse, uncomplicated: Secondary | ICD-10-CM

## 2014-10-23 DIAGNOSIS — F3130 Bipolar disorder, current episode depressed, mild or moderate severity, unspecified: Secondary | ICD-10-CM

## 2014-10-23 MED ORDER — AMITRIPTYLINE HCL 100 MG PO TABS: 100.0000 mg | ORAL_TABLET | Freq: Every day | ORAL | Status: DC

## 2014-10-23 MED ORDER — ALPRAZOLAM 0.5 MG PO TABS: 0.5000 mg | ORAL_TABLET | Freq: Two times a day (BID) | ORAL | Status: DC

## 2014-10-23 MED ORDER — DIVALPROEX SODIUM 250 MG PO DR TAB: 750.0000 mg | DELAYED_RELEASE_TABLET | Freq: Every day | ORAL | Status: DC

## 2014-10-23 NOTE — Telephone Encounter (Signed)
Prescription available, patient aware  

## 2014-10-23 NOTE — Progress Notes (Signed)
Patient ID: Adasha M Rafferty, female   DOB: 05/06/1951, 64 y.o.   MRN: [id number] Patient ID: Dianelly M Ballard, female   DOB: 05/03/1951, 64 y.o.   MRN: [id number] Patient ID: Idella M Pompei, female   DOB: 01/16/1951, 64 y.o.   MRN: [id number] Patient ID: Izola M Laroque, female   DOB: 06/15/1951, 64 y.o.   MRN: [id number] Patient ID: Joniya M Baumler, female   DOB: 08/03/1951, 64 y.o.   MRN: [id number] [facility] [address] Progress Note Rekha M Altadonna MRN: [id number] DOB: 07/18/1951 Age: 64 y.o.  Date: 10/23/2014  Chief Complaint  Patient presents with  . Depression  . Anxiety  . Follow-up    History of present illness Patient is 64-year-old African American female who came for her followup appointment. She is currently with her neice's family in [place] and is on disability  The patient was last seen in March. At that time she told me she was moving to [place] to be with her daughter. She didn't like living down there and she and her grandson have come back. Her grandson got his own place to live and she is now staying in a motel temporarily. Her mood is "up and down" because she is unsure where she is going to live. She's in a lot of pain in her knee and would like to get back on the amitriptyline since she ran out. A nurse practitioner and primary care put her on Celexa without realizing that she's already on Cymbalta. I explained to her that made no sense to be on both. She does admit that she still drinks periodically sometimes too much on weekends. I warned her against this. She denies being severely depressed or suicidal  The patient returns after 3 months. She has moved out from her niece's house and is now living with her grandson on her own. She likes it much better and seems much more happy. She denies serious depression but claims she can't sleep and did better when she took Depakote. She's been off it about a month because she ran out and did not call us about it. She also stopped  Cymbalta because it made her too drowsy. She continues on amitriptyline and Xanax. She denies being depressed or having any manic symptoms. She also states that she is no longer drinking as much but still drinks occasionally   Current psychotropic medication Xanax 0.5 mg twice a day Amitriptyline 100 mg at bedtime   Past psychiatric history Patient has history of one psychiatric [facility] in [facility] She took overdose on her pills she was also intoxicated with alcohol. Patient has also seen by psychiatrist in 2004. At that time she was seeking more benzodiazepines and pain medication.  Vitals: BP 127/63 mmHg  Pulse 80  Ht 5' 3" (1.6 m)  Wt 223 lb (101.152 kg)  BMI 39.51 kg/m2  Allergies: Allergies  Allergen Reactions  . Neomycin-Bacitracin Zn-Polymyx Other (See Comments)    Reaction: skin starts to become raw and peels off  . Penicillins Hives  . Sulfonamide Derivatives Hives and Swelling   Medical History: Past Medical History  Diagnosis Date  . Hypertension   . Allergic rhinitis   . Anxiety   . Depression   . GERD (gastroesophageal reflux disease)   . High cholesterol   . Hypothyroid   . Chronic back pain   . Arthritis   . Immature cataract   . Sleep apnea     STOP BANG score=4  .   Seizures 20 yrs ago    unknown etiology-on meds  . Headaches, cluster 3 yrs ago    last one  . Mania   . 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. PTSD (post-traumatic stress disorder)   Patient has history of hypertension, allergic rhinitis, hypothyroidism, chronic back pain, hyperlipidemia and obesity.  She most recently had surgery on her right knee.  Surgical History: Past Surgical History  Procedure Laterality Date  . Left foot  x 2  . Foot surgery      left foot-  . Partial hysterectomy    . Oophoectomy  bilateral  . Cholecystectomy    . Lumbar disc surgery  L4-5  . Back surgery    . Knee arthroscopy  01/2012    right  . Total knee arthroplasty   07/01/2012    Procedure: TOTAL KNEE ARTHROPLASTY;  Surgeon: Stanley E Harrison, MD;  Location: [facility];  Service: Orthopedics;  Laterality: Right;  . Total knee revision Right 01/03/2013    Procedure: Patellaplasty Right Knee;  Surgeon: Stanley E Harrison, MD;  Location: [facility];  Service: Orthopedics;  Laterality: Right;  . Knee arthroscopy with lateral release Right 01/03/2013    Procedure: Lateral Release Patella Right Knee;  Surgeon: Stanley E Harrison, MD;  Location: [facility];  Service: Orthopedics;  Laterality: Right;  . Incision and drainage Right 01/03/2013    Procedure: INCISION AND DRAINAGE;  Surgeon: Stanley E Harrison, MD;  Location: [facility];  Service: Orthopedics;  Laterality: Right;  . Esophagogastroduodenoscopy  10/16/2008    RMR:Small hiatal hernia otherwise normal esophagus, stomach  D1, D2, status post passage of a 56-French Maloney dilator  . Colonoscopy  10/16/2008    RMR:Hemorrhoids, otherwise normal rectum; pancolonic diverticula the remainder of the colonic mucosa appeared normal. Next TCS due 10/2013.  . Esophagogastroduodenoscopy  06/28/2006    RMR:Normal esophagus. Small hiatal hernia. Otherwise normal stomach, D1 and D2, status post passage of a 56 French Maloney dilator  . Colonoscopy  06/28/2006    RMR: Internal hemorrhoids. Diminutive rectal polyps, cold biopsied/ Pancolonic diverticula. Polyps in the right colon removed   . Esophagogastroduodenoscopy (egd) with esophageal dilation N/A 07/14/2013    Procedure: ESOPHAGOGASTRODUODENOSCOPY (EGD) WITH ESOPHAGEAL DILATION;  Surgeon: Robert M Rourk, MD;  [facility];  Service: Endoscopy;  Laterality: N/A;  1:00   Family History: family history includes ADD / ADHD in her grandchild and grandchild; Alcohol abuse in her brother, brother, brother, brother, brother, father, mother, and another family member; Anxiety disorder in her brother, brother, brother, brother, brother, father, and mother; Bipolar disorder in her  sister; Dementia in her paternal aunt; Diabetes in an other family member; Drug abuse in her brother and brother; Heart disease in an other family member; Seizures in her brother and brother. There is no history of Depression, OCD, Paranoid behavior, Schizophrenia, Sexual abuse, or Physical abuse.   Psychosocial history Patient has been married for 16 years. She has 2 children. She is married however her relationship is very tense. She has history of leaving her husband in the past.  Currently she lives by herself.  Mental status examination Patient is mildly obese female who is casually dressed and fairly groomed.  She has difficulty walking due to pain.  She is cooperative and maintained fair eye contact her mood is good, she states she is much less depressed and her affect is brighter. She denies suicidal ideation s. Her speech is clear and coherent. Her attention and concentration is fair. .   She's alert and oriented x3. There are no psychotic symptoms present at this time. She denies any auditory or visual hallucination. Her insight judgment is fair and her impulse control is okay.  Lab Results:  Results for orders placed or performed in visit on 07/10/14 (from the past 8736 hour(s))  Valproic Acid level   Collection Time: 07/10/14  1:49 PM  Result Value Ref Range   Valproic Acid Lvl 24.0 (L) 50.0 - 100.0 ug/mL  Hepatic function panel   Collection Time: 07/10/14  1:49 PM  Result Value Ref Range   Total Bilirubin 0.3 0.2 - 1.2 mg/dL   Bilirubin, Direct 0.1 0.0 - 0.3 mg/dL   Indirect Bilirubin 0.2 0.2 - 1.2 mg/dL   Alkaline Phosphatase 60 39 - 117 U/L   AST 15 0 - 37 U/L   ALT 10 0 - 35 U/L   Total Protein 6.8 6.0 - 8.3 g/dL   Albumin 3.6 3.5 - 5.2 g/dL  Results for orders placed or performed in visit on 03/16/14 (from the past 8736 hour(s))  Sed Rate (ESR)   Collection Time: 03/16/14 10:09 AM  Result Value Ref Range   Sed Rate 4 0 - 22 mm/hr  CBC   Collection Time: 03/16/14 10:09  AM  Result Value Ref Range   WBC 5.0 4.0 - 10.5 K/uL   RBC 4.31 3.87 - 5.11 MIL/uL   Hemoglobin 11.7 (L) 12.0 - 15.0 g/dL   HCT 34.6 (L) 36.0 - 46.0 %   MCV 80.3 78.0 - 100.0 fL   MCH 27.1 26.0 - 34.0 pg   MCHC 33.8 30.0 - 36.0 g/dL   RDW 13.9 11.5 - 15.5 %   Platelets 186 150 - 400 K/uL  C-reactive protein   Collection Time: 03/16/14 10:09 AM  Result Value Ref Range   CRP <0.5 <0.60 mg/dL   Assessment Axis I bipolar disorder NOS, benzodiazepine abuse, alcohol abuse Axis II deferred Axis III see medical history Axis IV moderate Axis V 60-65  Plan/Discussion: . For now she can continue the amitriptyline and Xanax. She does not want to return to Cymbalta and does not have depressive symptoms right now. She will reinstate Depakote as it helped her mood and sleep. She'll return to see me in 3 months Time spent 15 minutes.  More than 50% of the time spent in psychoeducation, counseling and coordination of care.  Discuss safety plan that anytime having active suicidal thoughts or homicidal thoughts then patient need to call 911 or go to the local emergency room.  MEDICATIONS this encounter: Meds ordered this encounter  Medications  . divalproex (DEPAKOTE) 250 MG DR tablet    Sig: Take 3 tablets (750 mg total) by mouth at bedtime.    Dispense:  90 tablet    Refill:  2  . amitriptyline (ELAVIL) 100 MG tablet    Sig: Take 1 tablet (100 mg total) by mouth at bedtime.    Dispense:  30 tablet    Refill:  2  . ALPRAZolam (XANAX) 0.5 MG tablet    Sig: Take 1 tablet (0.5 mg total) by mouth 2 (two) times daily. For anxiety    Dispense:  60 tablet    Refill:  2    Medical Decision Making Problem Points:  Established problem, stable/improving (1), Established problem, worsening (2), New problem, with no additional work-up planned (3), Review of last therapy session (1) and Review of psycho-social stressors (1) Data Points:  Review or order clinical lab tests (1) Review and  summation of  old records (2) Review of medication regiment & side effects (2) Review of new medications or change in dosage (2)  Maidie Streight, MD

## 2014-11-19 ENCOUNTER — Other Ambulatory Visit: Payer: Self-pay | Admitting: *Deleted

## 2014-11-19 ENCOUNTER — Telehealth: Payer: Self-pay | Admitting: Orthopedic Surgery

## 2014-11-19 DIAGNOSIS — M1711 Unilateral primary osteoarthritis, right knee: Secondary | ICD-10-CM

## 2014-11-19 MED ORDER — OXYCODONE-ACETAMINOPHEN 7.5-325 MG PO TABS: 1.0000 | ORAL_TABLET | Freq: Four times a day (QID) | ORAL | Status: DC | PRN

## 2014-11-19 NOTE — Telephone Encounter (Signed)
Prescription available, called patient, no answer 

## 2014-12-21 ENCOUNTER — Other Ambulatory Visit: Payer: Self-pay | Admitting: *Deleted

## 2014-12-21 ENCOUNTER — Telehealth: Payer: Self-pay | Admitting: Orthopedic Surgery

## 2014-12-21 DIAGNOSIS — M1711 Unilateral primary osteoarthritis, right knee: Secondary | ICD-10-CM

## 2014-12-21 MED ORDER — OXYCODONE-ACETAMINOPHEN 7.5-325 MG PO TABS: 1.0000 | ORAL_TABLET | Freq: Four times a day (QID) | ORAL | Status: DC | PRN

## 2014-12-21 NOTE — Telephone Encounter (Signed)
Reached patient; prescription picked up.

## 2014-12-21 NOTE — Telephone Encounter (Signed)
Patient called to request refill on medication: oxyCODONE-acetaminophen (PERCOCET) 7.5-325 MG per tablet [[id number]][[id number]] - her phone# is [phone]

## 2014-12-21 NOTE — Telephone Encounter (Signed)
Prescription available, called patient, no answer 

## 2015-01-06 ENCOUNTER — Other Ambulatory Visit (HOSPITAL_COMMUNITY): Payer: Self-pay | Admitting: Family Medicine

## 2015-01-06 DIAGNOSIS — Z139 Encounter for screening, unspecified: Secondary | ICD-10-CM

## 2015-01-06 DIAGNOSIS — Z1231 Encounter for screening mammogram for malignant neoplasm of breast: Secondary | ICD-10-CM

## 2015-01-11 ENCOUNTER — Telehealth (HOSPITAL_COMMUNITY): Payer: Self-pay | Admitting: *Deleted

## 2015-01-11 NOTE — Telephone Encounter (Signed)
Opened in Error.

## 2015-01-14 ENCOUNTER — Encounter: Payer: Self-pay | Admitting: Internal Medicine

## 2015-01-19 ENCOUNTER — Telehealth: Payer: Self-pay | Admitting: Orthopedic Surgery

## 2015-01-19 NOTE — Telephone Encounter (Signed)
Patient called to request refill of medication:  oxyCODONE-acetaminophen (PERCOCET) 7.5-325 MG per tablet [[id number]][[id number]] - her ph# [phone]

## 2015-01-20 ENCOUNTER — Other Ambulatory Visit: Payer: Self-pay | Admitting: *Deleted

## 2015-01-20 ENCOUNTER — Encounter: Payer: Self-pay | Admitting: Orthopedic Surgery

## 2015-01-20 DIAGNOSIS — M1711 Unilateral primary osteoarthritis, right knee: Secondary | ICD-10-CM

## 2015-01-20 MED ORDER — OXYCODONE-ACETAMINOPHEN 7.5-325 MG PO TABS: 1.0000 | ORAL_TABLET | Freq: Four times a day (QID) | ORAL | Status: DC | PRN

## 2015-01-20 NOTE — Telephone Encounter (Signed)
Prescription available, patient aware  

## 2015-01-20 NOTE — Telephone Encounter (Signed)
Patient picked up prescription.

## 2015-01-21 ENCOUNTER — Ambulatory Visit (HOSPITAL_COMMUNITY): Payer: Self-pay

## 2015-01-21 ENCOUNTER — Other Ambulatory Visit (HOSPITAL_COMMUNITY): Payer: Self-pay

## 2015-01-21 ENCOUNTER — Other Ambulatory Visit (HOSPITAL_COMMUNITY): Payer: Self-pay | Admitting: Family Medicine

## 2015-01-21 ENCOUNTER — Ambulatory Visit (HOSPITAL_COMMUNITY): Payer: Self-pay | Admitting: Psychiatry

## 2015-01-21 DIAGNOSIS — M8580 Other specified disorders of bone density and structure, unspecified site: Secondary | ICD-10-CM

## 2015-01-27 ENCOUNTER — Ambulatory Visit: Payer: Medicare HMO | Admitting: Nurse Practitioner

## 2015-01-28 ENCOUNTER — Ambulatory Visit (INDEPENDENT_AMBULATORY_CARE_PROVIDER_SITE_OTHER): Payer: Commercial Managed Care - HMO | Admitting: Psychiatry

## 2015-01-28 ENCOUNTER — Encounter (HOSPITAL_COMMUNITY): Payer: Self-pay | Admitting: Psychiatry

## 2015-01-28 VITALS — BP 124/76 | HR 84 | Ht 63.0 in | Wt 288.8 lb

## 2015-01-28 DIAGNOSIS — F3130 Bipolar disorder, current episode depressed, mild or moderate severity, unspecified: Secondary | ICD-10-CM

## 2015-01-28 DIAGNOSIS — F319 Bipolar disorder, unspecified: Secondary | ICD-10-CM

## 2015-01-28 DIAGNOSIS — F1010 Alcohol abuse, uncomplicated: Secondary | ICD-10-CM

## 2015-01-28 DIAGNOSIS — F1310 Sedative, hypnotic or anxiolytic abuse, uncomplicated: Secondary | ICD-10-CM | POA: Diagnosis not present

## 2015-01-28 DIAGNOSIS — G894 Chronic pain syndrome: Secondary | ICD-10-CM

## 2015-01-28 DIAGNOSIS — F5105 Insomnia due to other mental disorder: Secondary | ICD-10-CM

## 2015-01-28 MED ORDER — AMITRIPTYLINE HCL 100 MG PO TABS: 100.0000 mg | ORAL_TABLET | Freq: Every day | ORAL | Status: DC

## 2015-01-28 MED ORDER — DIVALPROEX SODIUM 250 MG PO DR TAB: 750.0000 mg | DELAYED_RELEASE_TABLET | Freq: Every day | ORAL | Status: DC

## 2015-01-28 MED ORDER — ALPRAZOLAM 0.5 MG PO TABS: 0.5000 mg | ORAL_TABLET | Freq: Two times a day (BID) | ORAL | Status: DC

## 2015-01-28 NOTE — Progress Notes (Signed)
Patient ID: Diane [name], female   DOB: [birth year], 64 y.o.   MRN: [id number] Patient ID: Diane [name], female   DOB: 09/02/1951, 64 y.o.   MRN: [id number] Patient ID: Diane [name], female   DOB: 1951-03-30, 64 y.o.   MRN: [id number] Patient ID: Diane [name], female   DOB: 1951-03-20, 64 y.o.   MRN: [id number] Patient ID: Diane [name], female   DOB: Apr 10, 1951, 64 y.o.   MRN: [id number] Patient ID: Diane [name], female   DOB: [birth year], 64 y.o.   MRN: [id number] [facility] [address]92[address] Progress Note Diane [name] MRN: [id number] DOB: [birth year] Age: 64 y.o.  Date: 01/28/2015  Chief Complaint  Patient presents with  . Depression    History of present illness Patient is 64 year old [place] American female who came for her followup appointment. She is currently with her husband in [place] and is on disability  The patient was last seen in March. At that time she told me she was moving to [place] to be with her daughter. She didn't like living down there and she and her grandson have come back. Her grandson got his own place to live and she is now staying in a motel temporarily. Her mood is "up and down" because she is unsure where she is going to live. She's in a lot of pain in her knee and would like to get back on the amitriptyline since she ran out. A nurse practitioner and primary care put her on Celexa without realizing that she's already on Cymbalta. I explained to her that made no sense to be on both. She does admit that she still drinks periodically sometimes too much on weekends. I warned her against this. She denies being severely depressed or suicidal  The patient returns after 3 months. She has now moved to [place] to live with her husband. They've been separated for 6 years because he was drinking and using drugs. However now he has cancer and needs her help and she has moved back. She's frustrated with him because he doesn't want to listen to medical advice and  still goes out and tries to party. She herself is totally stop drinking. Her mood is been fairly stable but she is somewhat stressed with her husband right now. She does think the medications have been helpful  Current psychotropic medication Xanax 0.5 mg twice a day Amitriptyline 100 mg at bedtime Depakote 750 mg daily at bedtime  Past psychiatric history Patient has history of one psychiatric admission in 2000 needed behavioral [place]. She took overdose on her pills she was also intoxicated with alcohol. Patient has also seen by psychiatrist in 2004. At that time she was seeking more benzodiazepines and pain medication.  Vitals: BP 124/76 mmHg  Pulse 84  Ht '5\' 3"'  (1.6 m)  Wt 130.999 kg (288 lb 12.8 oz)  BMI 51.17 kg/m2  Allergies: Allergies  Allergen Reactions  . Neomycin-Bacitracin Zn-Polymyx Other (See Comments)    Reaction: skin starts to become raw and peels off  . Penicillins Hives  . Sulfonamide Derivatives Hives and Swelling   Medical History: Past Medical History  Diagnosis Date  . Hypertension   . Allergic rhinitis   . Anxiety   . Depression   . GERD (gastroesophageal reflux disease)   . High cholesterol   . Hypothyroid   . Chronic back pain   . Arthritis   . Immature cataract   . Sleep apnea     STOP BANG  score=4  . Seizures 20 yrs ago    unknown etiology-on meds  . Headaches, cluster 3 yrs ago    last one  . Mania   . 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. PTSD (post-traumatic stress disorder)   Patient has history of hypertension, allergic rhinitis, hypothyroidism, chronic back pain, hyperlipidemia and obesity.  She most recently had surgery on her right knee.  Surgical History: Past Surgical History  Procedure Laterality Date  . Left foot  x 2  . Foot surgery      left foot-  . Partial hysterectomy    . Oophoectomy  bilateral  . Cholecystectomy    . Lumbar disc surgery  L4-5  . Back surgery    . Knee arthroscopy  01/2012    right  . Total  knee arthroplasty  07/01/2012    Procedure: TOTAL KNEE ARTHROPLASTY;  Surgeon: Carole Civil, MD;  Location: [facility];  Service: Orthopedics;  Laterality: Right;  . Total knee revision Right 01/03/2013    Procedure: Patellaplasty Right Knee;  Surgeon: Carole Civil, MD;  Location: [facility];  Service: Orthopedics;  Laterality: Right;  . Knee arthroscopy with lateral release Right 01/03/2013    Procedure: Lateral Release Patella Right Knee;  Surgeon: Carole Civil, MD;  Location: [facility];  Service: Orthopedics;  Laterality: Right;  . Incision and drainage Right 01/03/2013    Procedure: INCISION AND DRAINAGE;  Surgeon: Carole Civil, MD;  Location: [facility];  Service: Orthopedics;  Laterality: Right;  . Esophagogastroduodenoscopy  10/16/2008    MVE:HMCNO hiatal hernia otherwise normal esophagus, stomach  D1, D2, status post passage of a 56-French Maloney dilator  . Colonoscopy  10/16/2008    BSJ:[id number], otherwise normal rectum; pancolonic diverticula the remainder of the colonic mucosa appeared normal. Next TCS due 10/2013.  Marland Kitchen Esophagogastroduodenoscopy  06/28/2006    LYY:TKPTWS esophagus. Small hiatal hernia. Otherwise normal stomach, D1 and D2, status post passage of a 56 [place] Maloney dilator  . Colonoscopy  06/28/2006    RMR: Internal hemorrhoids. Diminutive rectal polyps, cold biopsied/ Pancolonic diverticula. Polyps in the right colon removed   . Esophagogastroduodenoscopy (egd) with esophageal dilation N/A 07/14/2013    RMR: Abnormal esophagus of uncertain significance-status post esophageal biospy. small hiatal hernia.    Family History: family history includes ADD / ADHD in her grandchild and grandchild; Alcohol abuse in her brother, brother, brother, brother, brother, father, mother, and another family member; Anxiety disorder in her brother, brother, brother, brother, brother, father, and mother; Bipolar disorder in her sister; Dementia in her paternal aunt; Diabetes in an  other family member; Drug abuse in her brother and brother; Heart disease in an other family member; Seizures in her brother and brother. There is no history of Depression, OCD, Paranoid behavior, Schizophrenia, Sexual abuse, or Physical abuse.   Psychosocial history Patient has been married for 16 years. She has 2 children. She is married however her relationship is very tense. She has history of leaving her husband in the past.  Currently she lives by herself.  Mental status examination Patient is mildly obese female who is casually dressed and fairly groomed.  .  She is cooperative and maintained fair eye contact her mood is fairly good but she appears tired and her affect is subdued She denies suicidal ideation s. Her speech is clear and coherent. Her attention and concentration is fair. . She's alert and oriented x3. There are no psychotic symptoms present at this time. She denies any  auditory or visual hallucination. Her insight judgment is fair and her impulse control is okay.  Lab Results:  Results for orders placed or performed in visit on 07/10/14 (from the past 8736 hour(s))  Valproic Acid level   Collection Time: 07/10/14  1:49 PM  Result Value Ref Range   Valproic Acid Lvl 24.0 (L) 50.0 - 100.0 ug/mL  Hepatic function panel   Collection Time: 07/10/14  1:49 PM  Result Value Ref Range   Total Bilirubin 0.3 0.2 - 1.2 mg/dL   Bilirubin, Direct 0.1 0.0 - 0.3 mg/dL   Indirect Bilirubin 0.2 0.2 - 1.2 mg/dL   Alkaline Phosphatase 60 39 - 117 U/L   AST 15 0 - 37 U/L   ALT 10 0 - 35 U/L   Total Protein 6.8 6.0 - 8.3 g/dL   Albumin 3.6 3.5 - 5.2 g/dL  Results for orders placed or performed in visit on 03/16/14 (from the past 8736 hour(s))  Sed Rate (ESR)   Collection Time: 03/16/14 10:09 AM  Result Value Ref Range   Sed Rate 4 0 - 22 mm/hr  CBC   Collection Time: 03/16/14 10:09 AM  Result Value Ref Range   WBC 5.0 4.0 - 10.5 K/uL   RBC 4.31 3.87 - 5.11 MIL/uL   Hemoglobin 11.7  (L) 12.0 - 15.0 g/dL   HCT 34.6 (L) 36.0 - 46.0 %   MCV 80.3 78.0 - 100.0 fL   MCH 27.1 26.0 - 34.0 pg   MCHC 33.8 30.0 - 36.0 g/dL   RDW 13.9 11.5 - 15.5 %   Platelets 186 150 - 400 K/uL  C-reactive protein   Collection Time: 03/16/14 10:09 AM  Result Value Ref Range   CRP <0.5 <0.60 mg/dL   Assessment Axis I bipolar disorder NOS, benzodiazepine abuse, alcohol abuse Axis II deferred Axis III see medical history Axis IV moderate Axis V 60-65  Plan/Discussion: She'll continue amitriptyline for depression and sleep, Xanax for anxiety, and Depakote for mood disorder She'll return to see me in 3 months Time spent 15 minutes.  More than 50% of the time spent in psychoeducation, counseling and coordination of care.  Discuss safety plan that anytime having active suicidal thoughts or homicidal thoughts then patient need to call 911 or go to the local emergency room.  MEDICATIONS this encounter: Meds ordered this encounter  Medications  . lubiprostone (AMITIZA) 24 MCG capsule    Sig: Take 24 mcg by mouth 2 (two) times daily with a meal.  . amitriptyline (ELAVIL) 100 MG tablet    Sig: Take 1 tablet (100 mg total) by mouth at bedtime.    Dispense:  30 tablet    Refill:  2  . divalproex (DEPAKOTE) 250 MG DR tablet    Sig: Take 3 tablets (750 mg total) by mouth at bedtime.    Dispense:  90 tablet    Refill:  2  . ALPRAZolam (XANAX) 0.5 MG tablet    Sig: Take 1 tablet (0.5 mg total) by mouth 2 (two) times daily. For anxiety    Dispense:  60 tablet    Refill:  2    Medical Decision Making Problem Points:  Established problem, stable/improving (1), Established problem, worsening (2), New problem, with no additional work-up planned (3), Review of last therapy session (1) and Review of psycho-social stressors (1) Data Points:  Review or order clinical lab tests (1) Review and summation of old records (2) Review of medication regiment & side effects (2) Review of new  medications or  change in dosage (2)  Blong Busk, Neoma Laming, MD

## 2015-02-01 ENCOUNTER — Ambulatory Visit (HOSPITAL_COMMUNITY): Payer: Self-pay

## 2015-02-01 ENCOUNTER — Other Ambulatory Visit (HOSPITAL_COMMUNITY): Payer: Self-pay

## 2015-02-02 ENCOUNTER — Ambulatory Visit (INDEPENDENT_AMBULATORY_CARE_PROVIDER_SITE_OTHER): Payer: Commercial Managed Care - HMO | Admitting: Nurse Practitioner

## 2015-02-02 ENCOUNTER — Encounter: Payer: Self-pay | Admitting: Nurse Practitioner

## 2015-02-02 VITALS — BP 128/75 | HR 83 | Temp 97.1°F | Ht 63.0 in | Wt 232.8 lb

## 2015-02-02 DIAGNOSIS — K5900 Constipation, unspecified: Secondary | ICD-10-CM | POA: Diagnosis not present

## 2015-02-02 DIAGNOSIS — K219 Gastro-esophageal reflux disease without esophagitis: Secondary | ICD-10-CM

## 2015-02-02 MED ORDER — LINACLOTIDE 145 MCG PO CAPS: 145.0000 ug | ORAL_CAPSULE | Freq: Every day | ORAL | Status: DC

## 2015-02-02 MED ORDER — OMEPRAZOLE 20 MG PO CPDR: 20.0000 mg | DELAYED_RELEASE_CAPSULE | Freq: Every day | ORAL | Status: DC

## 2015-02-02 NOTE — Progress Notes (Signed)
cc'ed to pcp °

## 2015-02-02 NOTE — Progress Notes (Signed)
Referring Provider: Elfredia NevinsFusco, Lawrence, MD Primary Care Physician:  Cassell SmilesFUSCO,LAWRENCE J., MD Primary GI: Dr. Jena Gaussourk  Chief Complaint  Patient presents with  . Constipation    HPI:   64 year old female referred by PCP for chronic constipation. PCP records reviewed. Last PCP visit 01/14/2015 at which patient stated she is having constipation and may go 2-3 weeks with no bowel movement, has tried MiraLAX and many other over-the-counter treatments. At this point her PCP stopped Amitiza, started Movantik, and referred to GI for further evaluation. Patient last seen in our office 07/02/2013 for GERD symptoms. Endoscopy completed 07/14/2013 found slightly for Road and ringed-appearing tubular esophagus, no Barrett's, no esophagitis, small hiatal hernia, normal gastric mucosa, normal first and second portion of the duodenum. A 54 French Maloney dilator was passed. Biopsies were taking of the abnormal looking esophagus of uncertain significance. Pathology report found slightly inflamed squamous epithelium, no evidence of fungal organism, eosinophilic esophagitis, intestinal metaplasia, dysplasia, or malignancy. No records of colonoscopy found in the EMR system.  Today she states she ahs had on and off issues with constipation "for years." Has tried Miralax, ExLax, Benefiber, dulcolax, colace, and others without good results. Was on Amitiza which did not help. Insurance wouldn't cover Movantik as Rx by [employer] she has continued taking Amitiza. Denies hematochezia, melena. Admits abdominal discomfort described as crampy as well as a lot of flatulence. GERD symptoms are not well controlled, has breakthrough symptoms 1-2 times a month. Is not taking a PPI currently. Denies any other upper or lower GI symptoms. States she thought she had a colonoscopy in 2014 with her EGD, but records indicate she only had an EGD. Has not had a colonoscopy otherwise (per patient.)  Past Medical History  Diagnosis Date  . Hypertension    . Allergic rhinitis   . Anxiety   . Depression   . GERD (gastroesophageal reflux disease)   . High cholesterol   . Hypothyroid   . Chronic back pain   . Arthritis   . Immature cataract   . Sleep apnea     STOP BANG score=4  . Seizures 20 yrs ago    unknown etiology-on meds  . Headaches, cluster 3 yrs ago    last one  . Mania   . 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. PTSD (post-traumatic stress disorder)     Past Surgical History  Procedure Laterality Date  . Left foot  x 2  . Foot surgery      left foot-  . Partial hysterectomy    . Oophoectomy  bilateral  . Cholecystectomy    . Lumbar disc surgery  L4-5  . Back surgery    . Knee arthroscopy  01/2012    right  . Total knee arthroplasty  07/01/2012    Procedure: TOTAL KNEE ARTHROPLASTY;  Surgeon: Vickki HearingStanley E Harrison, MD;  Location: [facility];  Service: Orthopedics;  Laterality: Right;  . Total knee revision Right 01/03/2013    Procedure: Patellaplasty Right Knee;  Surgeon: Vickki HearingStanley E Harrison, MD;  Location: [facility];  Service: Orthopedics;  Laterality: Right;  . Knee arthroscopy with lateral release Right 01/03/2013    Procedure: Lateral Release Patella Right Knee;  Surgeon: Vickki HearingStanley E Harrison, MD;  Location: [facility];  Service: Orthopedics;  Laterality: Right;  . Incision and drainage Right 01/03/2013    Procedure: INCISION AND DRAINAGE;  Surgeon: Vickki HearingStanley E Harrison, MD;  Location: [facility];  Service: Orthopedics;  Laterality: Right;  . Esophagogastroduodenoscopy  10/16/2008  WUJ:WJXBJ hiatal hernia otherwise normal esophagus, stomach  D1, D2, status post passage of a 56-French Maloney dilator  . Colonoscopy  10/16/2008    YNW:GNFAOZHYQMV, otherwise normal rectum; pancolonic diverticula the remainder of the colonic mucosa appeared normal. Next TCS due 10/2013.  Marland Kitchen Esophagogastroduodenoscopy  06/28/2006    HQI:ONGEXB esophagus. Small hiatal hernia. Otherwise normal stomach, D1 and D2, status post passage of a 56 [place] Maloney dilator  .  Colonoscopy  06/28/2006    RMR: Internal hemorrhoids. Diminutive rectal polyps, cold biopsied/ Pancolonic diverticula. Polyps in the right colon removed   . Esophagogastroduodenoscopy (egd) with esophageal dilation N/A 07/14/2013    RMR: Abnormal esophagus of uncertain significance-status post esophageal biospy. small hiatal hernia.     Current Outpatient Prescriptions  Medication Sig Dispense Refill  . ALPRAZolam (XANAX) 0.5 MG tablet Take 1 tablet (0.5 mg total) by mouth 2 (two) times daily. For anxiety 60 tablet 2  . amitriptyline (ELAVIL) 100 MG tablet Take 1 tablet (100 mg total) by mouth at bedtime. 30 tablet 2  . cyclobenzaprine (FLEXERIL) 10 MG tablet Take 1 tablet (10 mg total) by mouth 3 (three) times daily. For spasms 60 tablet 5  . divalproex (DEPAKOTE) 250 MG DR tablet Take 3 tablets (750 mg total) by mouth at bedtime. 90 tablet 2  . levothyroxine (LEVOTHROID) 150 MCG tablet Take 175 mcg by mouth daily.     Marland Kitchen lubiprostone (AMITIZA) 24 MCG capsule Take 24 mcg by mouth 2 (two) times daily with a meal.    . oxyCODONE-acetaminophen (PERCOCET) 7.5-325 MG per tablet Take 1 tablet by mouth every 6 (six) hours as needed. 120 tablet 0  . rosuvastatin (CRESTOR) 40 MG tablet Take 40 mg by mouth daily.       No current facility-administered medications for this visit.    Allergies as of 02/02/2015 - Review Complete 02/02/2015  Allergen Reaction Noted  . Neomycin-bacitracin zn-polymyx Other (See Comments) 02/27/2008  . Penicillins Hives   . Sulfonamide derivatives Hives and Swelling 02/27/2008    Family History  Problem Relation Age of Onset  . Heart disease    . Diabetes    . Alcohol abuse    . Alcohol abuse Mother   . Anxiety disorder Mother   . Alcohol abuse Father   . Anxiety disorder Father   . Bipolar disorder Sister   . Alcohol abuse Brother   . Anxiety disorder Brother   . Drug abuse Brother   . Dementia Paternal Aunt   . Depression Neg Hx   . OCD Neg Hx   . Paranoid  behavior Neg Hx   . Schizophrenia Neg Hx   . Sexual abuse Neg Hx   . Physical abuse Neg Hx   . ADD / ADHD Grandchild   . ADD / ADHD Grandchild   . Drug abuse Brother   . Alcohol abuse Brother   . Anxiety disorder Brother   . Seizures Brother   . Alcohol abuse Brother   . Anxiety disorder Brother   . Seizures Brother   . Alcohol abuse Brother   . Anxiety disorder Brother   . Alcohol abuse Brother   . Anxiety disorder Brother     History   Social History  . Marital Status: Legally Separated    Spouse Name: N/A  . Number of Children: N/A  . Years of Education: 12th grade   Occupational History  . disabled    Social History Main Topics  . Smoking status: Former Smoker -- 0.25 packs/day  for 5 years    Types: Cigarettes    Quit date: 06/26/2003  . Smokeless tobacco: Never Used  . Alcohol Use: Yes     Comment: occasionally  . Drug Use: No     Comment: has a past history of street drug use  . Sexual Activity: Yes    Birth Control/ Protection: Surgical   Other Topics Concern  . None   Social History Narrative    Review of Systems: General: Negative for anorexia, weight loss, fever, chills, fatigue, weakness. Eyes: Negative for vision changes.  ENT: Negative for hoarseness, difficulty swallowing. CV: Negative for chest pain, angina, palpitations.  Respiratory: Negative for dyspnea at rest, cough, wheezing.  GI: See history of present illness. Derm: Negative for rash or itching.  Neuro: Negative for weakness, memory loss, confusion.  Psych: Negative for anxiety, depression.  Endo: Negative for unusual weight change.  Heme: Negative for bruising or bleeding.   Physical Exam: BP 128/75 mmHg  Pulse 83  Temp(Src) 97.1 F (36.2 C)  Ht  (1.6 m)  Wt 232 lb 12.8 oz (105.597 kg)  BMI 41.25 kg/m2 General:   Alert and oriented. Pleasant and cooperative. Well-nourished and well-developed.  Head:  Normocephalic and atraumatic. Eyes:  Without icterus, sclera clear  and conjunctiva pink.  Ears:  Normal auditory acuity. Cardiovascular:  S1, S2 present without murmurs appreciated. Normal pulses noted. Extremities without clubbing or edema. Respiratory:  Clear to auscultation bilaterally. No wheezes, rales, or rhonchi. No distress.  Gastrointestinal:  +BS, soft, and non-distended. Mild tenderness lower abdomen and epigastric area. No HSM noted. No guarding or rebound. No masses appreciated.  Rectal:  Deferred  Neurologic:  Alert and oriented x4;  grossly normal neurologically. Psych:  Alert and cooperative. Normal mood and affect.    02/02/2015 9:56 AM

## 2015-02-02 NOTE — Assessment & Plan Note (Signed)
Patient with a history of GERD. Currently having symptoms 1-2 times a month. They are not very irregular, however she states they are bothersome. Is not currently on a PPI. We'll start her on Prilosec 20 mg daily and return for 3 months for further evaluation.

## 2015-02-02 NOTE — Patient Instructions (Signed)
1. Stop Amitiza. 2. Start Linzess 145 g once a day. We will provide samples for 2 weeks. 3. Call us near 2 weeks and until us how the medication is working for you. Exline start taking Prilosec 20 mg daily. I send in a prescription to your pharmacy for this. 4. Return for further evaluation of your symptoms in 3 months.

## 2015-02-02 NOTE — Assessment & Plan Note (Signed)
Patient with a long-standing history of constipation. Has tried multiple medications including several over-the-counter meds such as MiraLAX, Benefiber, Colace, Ex-Lax, and others without good result. Was on Amitiza without good result. PCP attempted to start Movantik, however insurance would not cover this. At this point we'll have her stop Amitiza and start Linzess 145 g daily. We will provide her with samples for 2 weeks to test effectiveness. She will call us in 2 weeks with the results of the new medication at which point we can decide whether to continue her prescription. She distinctly remembers having a colonoscopy, however records are not found in the [facility] chart system. [facility]. Return in 3 months for further evaluation.

## 2015-02-22 ENCOUNTER — Other Ambulatory Visit: Payer: Self-pay | Admitting: *Deleted

## 2015-02-22 ENCOUNTER — Telehealth: Payer: Self-pay | Admitting: Orthopedic Surgery

## 2015-02-22 DIAGNOSIS — M1711 Unilateral primary osteoarthritis, right knee: Secondary | ICD-10-CM

## 2015-02-22 MED ORDER — OXYCODONE-ACETAMINOPHEN 7.5-325 MG PO TABS: 1.0000 | ORAL_TABLET | Freq: Four times a day (QID) | ORAL | Status: DC | PRN

## 2015-02-22 NOTE — Telephone Encounter (Signed)
Patient called to request refill on medication:  Medication   oxyCODONE-acetaminophen (PERCOCET) 7.5-325 MG per tablet [[id number]]       her phone # is [phone]

## 2015-02-22 NOTE — Telephone Encounter (Signed)
Prescription available, patient aware  

## 2015-02-23 NOTE — Telephone Encounter (Signed)
Patient picked up Rx

## 2015-02-24 ENCOUNTER — Ambulatory Visit (HOSPITAL_COMMUNITY)
Admission: RE | Admit: 2015-02-24 | Discharge: 2015-02-24 | Disposition: A | Discharge status: Home / Self Care | Payer: Commercial Managed Care - HMO | Source: Ambulatory Visit | Attending: Family Medicine | Admitting: Family Medicine

## 2015-02-24 DIAGNOSIS — Z780 Asymptomatic menopausal state: Secondary | ICD-10-CM | POA: Insufficient documentation

## 2015-02-24 DIAGNOSIS — Z1231 Encounter for screening mammogram for malignant neoplasm of breast: Secondary | ICD-10-CM

## 2015-02-24 DIAGNOSIS — R29890 Loss of height: Secondary | ICD-10-CM | POA: Insufficient documentation

## 2015-02-24 DIAGNOSIS — M8580 Other specified disorders of bone density and structure, unspecified site: Secondary | ICD-10-CM

## 2015-03-22 ENCOUNTER — Other Ambulatory Visit: Payer: Self-pay | Admitting: *Deleted

## 2015-03-22 ENCOUNTER — Telehealth: Payer: Self-pay | Admitting: Orthopedic Surgery

## 2015-03-22 DIAGNOSIS — M1711 Unilateral primary osteoarthritis, right knee: Secondary | ICD-10-CM

## 2015-03-22 MED ORDER — OXYCODONE-ACETAMINOPHEN 7.5-325 MG PO TABS: 1.0000 | ORAL_TABLET | Freq: Four times a day (QID) | ORAL | Status: DC | PRN

## 2015-03-22 NOTE — Telephone Encounter (Signed)
Patient is calling requesting a refill on medication oxyCODONE-acetaminophen (PERCOCET) 7.5-325 MG per tablet  Please advise?

## 2015-03-22 NOTE — Telephone Encounter (Signed)
Prescription available, called patient, no answer 

## 2015-03-22 NOTE — Telephone Encounter (Signed)
Patient picked up Rx

## 2015-04-02 ENCOUNTER — Emergency Department (HOSPITAL_COMMUNITY)
Admission: EM | Admit: 2015-04-02 | Discharge: 2015-04-02 | Disposition: A | Discharge status: Home / Self Care | Payer: Commercial Managed Care - HMO | Attending: Emergency Medicine | Admitting: Emergency Medicine

## 2015-04-02 ENCOUNTER — Encounter (HOSPITAL_COMMUNITY): Payer: Self-pay | Admitting: *Deleted

## 2015-04-02 DIAGNOSIS — G8929 Other chronic pain: Secondary | ICD-10-CM | POA: Diagnosis not present

## 2015-04-02 DIAGNOSIS — I10 Essential (primary) hypertension: Secondary | ICD-10-CM | POA: Insufficient documentation

## 2015-04-02 DIAGNOSIS — E039 Hypothyroidism, unspecified: Secondary | ICD-10-CM | POA: Insufficient documentation

## 2015-04-02 DIAGNOSIS — Z880 Allergy status to penicillin: Secondary | ICD-10-CM | POA: Diagnosis not present

## 2015-04-02 DIAGNOSIS — K219 Gastro-esophageal reflux disease without esophagitis: Secondary | ICD-10-CM | POA: Insufficient documentation

## 2015-04-02 DIAGNOSIS — Z79899 Other long term (current) drug therapy: Secondary | ICD-10-CM | POA: Diagnosis not present

## 2015-04-02 DIAGNOSIS — G44009 Cluster headache syndrome, unspecified, not intractable: Secondary | ICD-10-CM | POA: Insufficient documentation

## 2015-04-02 DIAGNOSIS — Z8709 Personal history of other diseases of the respiratory system: Secondary | ICD-10-CM | POA: Insufficient documentation

## 2015-04-02 DIAGNOSIS — R609 Edema, unspecified: Secondary | ICD-10-CM

## 2015-04-02 DIAGNOSIS — R222 Localized swelling, mass and lump, trunk: Secondary | ICD-10-CM | POA: Diagnosis present

## 2015-04-02 DIAGNOSIS — F329 Major depressive disorder, single episode, unspecified: Secondary | ICD-10-CM | POA: Diagnosis not present

## 2015-04-02 DIAGNOSIS — Z87891 Personal history of nicotine dependence: Secondary | ICD-10-CM | POA: Insufficient documentation

## 2015-04-02 DIAGNOSIS — F419 Anxiety disorder, unspecified: Secondary | ICD-10-CM | POA: Diagnosis not present

## 2015-04-02 DIAGNOSIS — H269 Unspecified cataract: Secondary | ICD-10-CM | POA: Insufficient documentation

## 2015-04-02 DIAGNOSIS — Z8739 Personal history of other diseases of the musculoskeletal system and connective tissue: Secondary | ICD-10-CM | POA: Insufficient documentation

## 2015-04-02 DIAGNOSIS — E780 Pure hypercholesterolemia: Secondary | ICD-10-CM | POA: Diagnosis not present

## 2015-04-02 NOTE — ED Provider Notes (Signed)
CSN: [id number]     Arrival date & time 04/02/15  1154 History   First MD Initiated Contact with Patient 04/02/15 1206     Chief Complaint  Patient presents with  . Cyst     (Consider location/radiation/quality/duration/timing/severity/associated sxs/prior Treatment) HPI Comments: 64 year old female with past medical history including hypertension, hyperlipidemia, GERD, bipolar disorder who presents with knot on left upper chest. Patient states earlier today when she got out of the shower, she was drying off and noticed an area of swelling above her medial left clavicle. Today is the first day that she noticed it and it has not been present previously. It is mildly tender to palpation but she denies any neck swelling, tenderness, or stiffness. She denies any fevers, chest pain, shortness of breath, cough, unintentional weight loss, night sweats, chills, abdominal pain, vomiting, cough/cold symptoms, sore throat, dysphasia, or any recent illness. She otherwise feels well. No dental pain. No trauma.  The history is provided by the patient.    Past Medical History  Diagnosis Date  . Hypertension   . Allergic rhinitis   . Anxiety   . Depression   . GERD (gastroesophageal reflux disease)   . High cholesterol   . Hypothyroid   . Chronic back pain   . Arthritis   . Immature cataract   . Sleep apnea     STOP BANG score=4  . Seizures 20 yrs ago    unknown etiology-on meds  . Headaches, cluster 3 yrs ago    last one  . Mania   . 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. PTSD (post-traumatic stress disorder)    Past Surgical History  Procedure Laterality Date  . Left foot  x 2  . Foot surgery      left foot-  . Partial hysterectomy    . Oophoectomy  bilateral  . Cholecystectomy    . Lumbar disc surgery  L4-5  . Back surgery    . Knee arthroscopy  01/2012    right  . Total knee arthroplasty  07/01/2012    Procedure: TOTAL KNEE ARTHROPLASTY;  Surgeon: Vickki Hearing, MD;  Location: [facility];  Service: Orthopedics;  Laterality: Right;  . Total knee revision Right 01/03/2013    Procedure: Patellaplasty Right Knee;  Surgeon: Vickki Hearing, MD;  Location: [facility];  Service: Orthopedics;  Laterality: Right;  . Knee arthroscopy with lateral release Right 01/03/2013    Procedure: Lateral Release Patella Right Knee;  Surgeon: Vickki Hearing, MD;  Location: [facility];  Service: Orthopedics;  Laterality: Right;  . Incision and drainage Right 01/03/2013    Procedure: INCISION AND DRAINAGE;  Surgeon: Vickki Hearing, MD;  Location: [facility];  Service: Orthopedics;  Laterality: Right;  . Esophagogastroduodenoscopy  10/16/2008    WUJ:WJXBJ hiatal hernia otherwise normal esophagus, stomach  D1, D2, status post passage of a 56-French Maloney dilator  . Colonoscopy  10/16/2008    YNW:GNFAOZHYQMV, otherwise normal rectum; pancolonic diverticula the remainder of the colonic mucosa appeared normal. Next TCS due 10/2013.  Marland Kitchen Esophagogastroduodenoscopy  06/28/2006    HQI:ONGEXB esophagus. Small hiatal hernia. Otherwise normal stomach, D1 and D2, status post passage of a 56 [place] Maloney dilator  . Colonoscopy  06/28/2006    RMR: Internal hemorrhoids. Diminutive rectal polyps, cold biopsied/ Pancolonic diverticula. Polyps in the right colon removed   . Esophagogastroduodenoscopy (egd) with esophageal dilation N/A 07/14/2013    RMR: Abnormal esophagus of uncertain significance-status post esophageal biospy. small hiatal hernia.  Family History  Problem Relation Age of Onset  . Heart disease    . Diabetes    . Alcohol abuse    . Alcohol abuse Mother   . Anxiety disorder Mother   . Alcohol abuse Father   . Anxiety disorder Father   . Bipolar disorder Sister   . Alcohol abuse Brother   . Anxiety disorder Brother   . Drug abuse Brother   . Dementia Paternal Aunt   . Depression Neg Hx   . OCD Neg Hx   . Paranoid behavior Neg Hx   . Schizophrenia Neg Hx   . Sexual abuse Neg Hx   .  Physical abuse Neg Hx   . ADD / ADHD Grandchild   . ADD / ADHD Grandchild   . Drug abuse Brother   . Alcohol abuse Brother   . Anxiety disorder Brother   . Seizures Brother   . Alcohol abuse Brother   . Anxiety disorder Brother   . Seizures Brother   . Alcohol abuse Brother   . Anxiety disorder Brother   . Alcohol abuse Brother   . Anxiety disorder Brother   . Colon cancer Neg Hx    History  Substance Use Topics  . Smoking status: Former Smoker -- 0.25 packs/day for 5 years    Types: Cigarettes    Quit date: 06/26/2003  . Smokeless tobacco: Never Used  . Alcohol Use: Yes     Comment: occasionally   OB History    No data available     Review of Systems 10 Systems reviewed and are negative for acute change except as noted in the HPI.    Allergies  Neomycin-bacitracin zn-polymyx; Penicillins; and Sulfonamide derivatives  Home Medications   Prior to Admission medications   Medication Sig Start Date End Date Taking? Authorizing Provider  ALPRAZolam Prudy Feeler) 0.5 MG tablet Take 1 tablet (0.5 mg total) by mouth 2 (two) times daily. For anxiety 01/28/15   Myrlene Broker, MD  amitriptyline (ELAVIL) 100 MG tablet Take 1 tablet (100 mg total) by mouth at bedtime. 01/28/15   Myrlene Broker, MD  cyclobenzaprine (FLEXERIL) 10 MG tablet Take 1 tablet (10 mg total) by mouth 3 (three) times daily. For spasms 07/29/13   Vickki Hearing, MD  divalproex (DEPAKOTE) 250 MG DR tablet Take 3 tablets (750 mg total) by mouth at bedtime. 01/28/15   Myrlene Broker, MD  levothyroxine (LEVOTHROID) 150 MCG tablet Take 175 mcg by mouth daily.     Historical Provider, MD  Linaclotide Karlene Einstein) 145 MCG CAPS capsule Take 1 capsule (145 mcg total) by mouth daily. 02/02/15   Anice Paganini, NP  lubiprostone (AMITIZA) 24 MCG capsule Take 24 mcg by mouth 2 (two) times daily with a meal.    Historical Provider, MD  omeprazole (PRILOSEC) 20 MG capsule Take 1 capsule (20 mg total) by mouth daily. 02/02/15   Anice Paganini, NP  oxyCODONE-acetaminophen (PERCOCET) 7.5-325 MG per tablet Take 1 tablet by mouth every 6 (six) hours as needed. 03/22/15   Vickki Hearing, MD  rosuvastatin (CRESTOR) 40 MG tablet Take 40 mg by mouth daily.      Historical Provider, MD   BP 134/66 mmHg  Pulse 82  Temp(Src) 98.7 F (37.1 C) (Oral)  Resp 18  Ht 5\' 3"  (1.6 m)  Wt 225 lb (102.059 kg)  BMI 39.87 kg/m2  SpO2 95% Physical Exam  Constitutional: She is oriented to person, place, and time. She appears  well-developed and well-nourished. No distress.  HENT:  Head: Normocephalic and atraumatic.  Mouth/Throat: Oropharynx is clear and moist.  Eyes: Conjunctivae are normal. Pupils are equal, round, and reactive to light.  Neck: Normal range of motion. Neck supple. No tracheal deviation present.  Cardiovascular: Normal rate, regular rhythm and normal heart sounds.   Pulmonary/Chest: Effort normal and breath sounds normal.  Abdominal: Soft. Bowel sounds are normal. She exhibits no distension. There is no tenderness.  Musculoskeletal: She exhibits no edema.  Lymphadenopathy:    She has no cervical adenopathy.  Neurological: She is alert and oriented to person, place, and time.  Fluent speech  Skin: Skin is warm and dry.  Small area of soft tissue fullness above L medial clavicle not involving neck, non-tender without any fluctuance, induration, erythema, or firmness  Psychiatric: She has a normal mood and affect. Judgment normal.    ED Course  Procedures (including critical care time) Labs Review Labs Reviewed - No data to display  Imaging Review No results found.   EKG Interpretation None      MDM   Final diagnoses:  Swelling of body region    64 year old female who presents with swelling over her left medial clavicle that she noticed earlier today. No discrete nodule to suggest lymphadenopathy and no fluctuance or induration to suggest infection. Patient appears well and denies any other complaints.  Infection, mass, or hematoma seen unlikely. I did instruct the patient follow up with PCP in one week if symptoms unimproved. Carefully reviewed return precautions and patient voice understanding. Patient discharged in satisfactory condition.   Laurence Spates, MD 04/02/15 1248

## 2015-04-02 NOTE — ED Notes (Signed)
Patient c/o "knot" in left upper chest, under clavicle. Patient denies any symptoms. Per patient "knot" slightly tender to touch and moves with palpitation-patient concerned due to family hx of cancer.

## 2015-04-02 NOTE — Discharge Instructions (Signed)
Edema  Edema is an abnormal buildup of fluids. It is more common in your legs and thighs. Painless swelling of the feet and ankles is more likely as a person ages. It also is common in looser skin, like around your eyes.  HOME CARE   · Keep the affected body part above the level of the heart while lying down.  · Do not sit still or stand for a long time.  · Do not put anything right under your knees when you lie down.  · Do not wear tight clothes on your upper legs.  · Exercise your legs to help the puffiness (swelling) go down.  · Wear elastic bandages or support stockings as told by your doctor.  · A low-salt diet may help lessen the puffiness.  · Only take medicine as told by your doctor.  GET HELP IF:  · Treatment is not working.  · You have heart, liver, or kidney disease and notice that your skin looks puffy or shiny.  · You have puffiness in your legs that does not get better when you raise your legs.  · You have sudden weight gain for no reason.  GET HELP RIGHT AWAY IF:   · You have shortness of breath or chest pain.  · You cannot breathe when you lie down.  · You have pain, redness, or warmth in the areas that are puffy.  · You have heart, liver, or kidney disease and get edema all of a sudden.  · You have a fever and your symptoms get worse all of a sudden.  MAKE SURE YOU:   · Understand these instructions.  · Will watch your condition.  · Will get help right away if you are not doing well or get worse.  Document Released: 02/14/2008 Document Revised: 09/02/2013 Document Reviewed: 06/20/2013  [employer]® Patient Information ©2015 [employer]. This information is not intended to replace advice given to you by your health care provider. Make sure you discuss any questions you have with your health care provider.

## 2015-04-26 ENCOUNTER — Telehealth: Payer: Self-pay | Admitting: Orthopedic Surgery

## 2015-04-26 ENCOUNTER — Other Ambulatory Visit: Payer: Self-pay | Admitting: *Deleted

## 2015-04-26 DIAGNOSIS — M1711 Unilateral primary osteoarthritis, right knee: Secondary | ICD-10-CM

## 2015-04-26 MED ORDER — OXYCODONE-ACETAMINOPHEN 7.5-325 MG PO TABS: 1.0000 | ORAL_TABLET | Freq: Four times a day (QID) | ORAL | Status: DC | PRN

## 2015-04-26 NOTE — Telephone Encounter (Signed)
Prescription available, patient aware  

## 2015-04-26 NOTE — Telephone Encounter (Addendum)
Patient called to request refill on medication:  Oxycodone/acetom/Percocet 7.5-325. Her ph# is [phone].

## 2015-04-27 NOTE — Telephone Encounter (Signed)
Patient picked up Rx

## 2015-04-30 ENCOUNTER — Encounter (HOSPITAL_COMMUNITY): Payer: Self-pay | Admitting: Psychiatry

## 2015-04-30 ENCOUNTER — Ambulatory Visit (INDEPENDENT_AMBULATORY_CARE_PROVIDER_SITE_OTHER): Payer: Commercial Managed Care - HMO | Admitting: Psychiatry

## 2015-04-30 VITALS — BP 115/68 | HR 81 | Ht 63.0 in | Wt 230.0 lb

## 2015-04-30 DIAGNOSIS — F5105 Insomnia due to other mental disorder: Secondary | ICD-10-CM

## 2015-04-30 DIAGNOSIS — F1310 Sedative, hypnotic or anxiolytic abuse, uncomplicated: Secondary | ICD-10-CM | POA: Diagnosis not present

## 2015-04-30 DIAGNOSIS — F1010 Alcohol abuse, uncomplicated: Secondary | ICD-10-CM | POA: Diagnosis not present

## 2015-04-30 DIAGNOSIS — F319 Bipolar disorder, unspecified: Secondary | ICD-10-CM

## 2015-04-30 DIAGNOSIS — F3130 Bipolar disorder, current episode depressed, mild or moderate severity, unspecified: Secondary | ICD-10-CM

## 2015-04-30 DIAGNOSIS — G894 Chronic pain syndrome: Secondary | ICD-10-CM

## 2015-04-30 MED ORDER — ALPRAZOLAM 0.5 MG PO TABS: 0.5000 mg | ORAL_TABLET | Freq: Two times a day (BID) | ORAL | Status: DC

## 2015-04-30 MED ORDER — TRAZODONE HCL 100 MG PO TABS: 100.0000 mg | ORAL_TABLET | Freq: Every day | ORAL | Status: DC

## 2015-04-30 MED ORDER — DIVALPROEX SODIUM 250 MG PO DR TAB: 750.0000 mg | DELAYED_RELEASE_TABLET | Freq: Every day | ORAL | Status: DC

## 2015-04-30 NOTE — Progress Notes (Signed)
Patient ID: Diane [name], female   DOB: 09-Apr-1951, 64 y.o.   MRN: [id number] Patient ID: Diane [name], female   DOB: Aug 28, 1951, 64 y.o.   MRN: [id number] Patient ID: Diane [name], female   DOB: 09-Apr-1951, 64 y.o.   MRN: [id number] Patient ID: Diane [name], female   DOB: 11-15-1950, 64 y.o.   MRN: [id number] Patient ID: Diane [name], female   DOB: Sep 21, 1950, 64 y.o.   MRN: [id number] Patient ID: Diane [name], female   DOB: 12-08-1950, 64 y.o.   MRN: [id number] Patient ID: Diane [name], female   DOB: Jun 22, 1951, 64 y.o.   MRN: [id number] [facility] [id number] Progress Note Diane [name] MRN: [id number] DOB: Mar 07, 1951 Age: 64 y.o.  Date: 04/30/2015  Chief Complaint  Patient presents with  . Depression  . Anxiety  . Follow-up    History of present illness Patient is 64 year old [place] American female who came for her followup appointment. She is currently with her husband in [place] and is on disability  The patient was last seen in March. At that time she told me she was moving to [place] to be with her daughter. She didn't like living down there and she and her grandson have come back. Her grandson got his own place to live and she is now staying in a motel temporarily. Her mood is "up and down" because she is unsure where she is going to live. She's in a lot of pain in her knee and would like to get back on the amitriptyline since she ran out. A nurse practitioner and primary care put her on Celexa without realizing that she's already on Cymbalta. I explained to her that made no sense to be on both. She does admit that she still drinks periodically sometimes too much on weekends. I warned her against this. She denies being severely depressed or suicidal  The patient returns after 3 months. She and her husband have moved back to [place]. She moved back in with him several months ago to help him get through his cancer treatment. She's not sure he totally appreciates this she  thinks he may have gone back to drinking but she's not sure. She worries a lot about him and sometimes can't sleep. She also is a lot of pain from her knee. The amitriptyline doesn't seem to be helping much with her sleep anymore. I told her we could switch this to trazodone. Her mood is been fairly stable. She is no longer drinking herself   Current psychotropic medication Xanax 0.5 mg twice a day Amitriptyline 100 mg at bedtime Depakote 750 mg daily at bedtime  Past psychiatric history Patient has history of one psychiatric admission in [facility]. She took overdose on her pills she was also intoxicated with alcohol. Patient has also seen by psychiatrist in 2004. At that time she was seeking more benzodiazepines and pain medication.  Vitals: BP 115/68 mmHg  Pulse 81  Ht 5\' 3"  (1.6 m)  Wt 230 lb (104.327 kg)  BMI 40.75 kg/m2  Allergies: Allergies  Allergen Reactions  . Neomycin-Bacitracin Zn-Polymyx Other (See Comments)    Reaction: skin starts to become raw and peels off  . Penicillins Hives  . Sulfonamide Derivatives Hives and Swelling   Medical History: Past Medical History  Diagnosis Date  . Hypertension   . Allergic rhinitis   . Anxiety   . Depression   . GERD (gastroesophageal reflux disease)   . High  cholesterol   . Hypothyroid   . Chronic back pain   . Arthritis   . Immature cataract   . Sleep apnea     STOP BANG score=4  . Seizures 20 yrs ago    unknown etiology-on meds  . Headaches, cluster 3 yrs ago    last one  . Mania   . 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. PTSD (post-traumatic stress disorder)   Patient has history of hypertension, allergic rhinitis, hypothyroidism, chronic back pain, hyperlipidemia and obesity.  She most recently had surgery on her right knee.  Surgical History: Past Surgical History  Procedure Laterality Date  . Left foot  x 2  . Foot surgery      left foot-  . Partial hysterectomy    . Oophoectomy   bilateral  . Cholecystectomy    . Lumbar disc surgery  L4-5  . Back surgery    . Knee arthroscopy  01/2012    right  . Total knee arthroplasty  07/01/2012    Procedure: TOTAL KNEE ARTHROPLASTY;  Surgeon: Vickki Hearing, MD;  Location: [facility];  Service: Orthopedics;  Laterality: Right;  . Total knee revision Right 01/03/2013    Procedure: Patellaplasty Right Knee;  Surgeon: Vickki Hearing, MD;  Location: [facility];  Service: Orthopedics;  Laterality: Right;  . Knee arthroscopy with lateral release Right 01/03/2013    Procedure: Lateral Release Patella Right Knee;  Surgeon: Vickki Hearing, MD;  Location: [facility];  Service: Orthopedics;  Laterality: Right;  . Incision and drainage Right 01/03/2013    Procedure: INCISION AND DRAINAGE;  Surgeon: Vickki Hearing, MD;  Location: [facility];  Service: Orthopedics;  Laterality: Right;  . Esophagogastroduodenoscopy  10/16/2008    JYN:WGNFA hiatal hernia otherwise normal esophagus, stomach  D1, D2, status post passage of a 56-French Maloney dilator  . Colonoscopy  10/16/2008    OZH:[id number], otherwise normal rectum; pancolonic diverticula the remainder of the colonic mucosa appeared normal. Next TCS due 10/2013.  Marland Kitchen Esophagogastroduodenoscopy  06/28/2006    XBM:WUXLKG esophagus. Small hiatal hernia. Otherwise normal stomach, D1 and D2, status post passage of a 56 [place] Maloney dilator  . Colonoscopy  06/28/2006    RMR: Internal hemorrhoids. Diminutive rectal polyps, cold biopsied/ Pancolonic diverticula. Polyps in the right colon removed   . Esophagogastroduodenoscopy (egd) with esophageal dilation N/A 07/14/2013    RMR: Abnormal esophagus of uncertain significance-status post esophageal biospy. small hiatal hernia.    Family History: family history includes ADD / ADHD in her grandchild and grandchild; Alcohol abuse in her brother, brother, brother, brother, brother, father, mother, and another family member; Anxiety disorder in her brother,  brother, brother, brother, brother, father, and mother; Bipolar disorder in her sister; Dementia in her paternal aunt; Diabetes in an other family member; Drug abuse in her brother and brother; Heart disease in an other family member; Seizures in her brother and brother. There is no history of Depression, OCD, Paranoid behavior, Schizophrenia, Sexual abuse, Physical abuse, or Colon cancer.   Psychosocial history Patient has been married for 16 years. She has 2 children. She is married however her relationship is very tense. She has history of leaving her husband in the past.  Currently she lives by herself.  Mental status examination Patient is mildly obese female who is casually dressed and fairly groomed.  .  She is cooperative and maintained fair eye contact her mood is fairly good but she appears tired and her affect is subdued She denies  suicidal ideation s. Her speech is clear and coherent. Her attention and concentration is fair. . She's alert and oriented x3. There are no psychotic symptoms present at this time. She denies any auditory or visual hallucination. Her insight judgment is fair and her impulse control is okay.  Lab Results:  Results for orders placed or performed in visit on 07/10/14 (from the past 8736 hour(s))  Valproic Acid level   Collection Time: 07/10/14  1:49 PM  Result Value Ref Range   Valproic Acid Lvl 24.0 (L) 50.0 - 100.0 ug/mL  Hepatic function panel   Collection Time: 07/10/14  1:49 PM  Result Value Ref Range   Total Bilirubin 0.3 0.2 - 1.2 mg/dL   Bilirubin, Direct 0.1 0.0 - 0.3 mg/dL   Indirect Bilirubin 0.2 0.2 - 1.2 mg/dL   Alkaline Phosphatase 60 39 - 117 U/L   AST 15 0 - 37 U/L   ALT 10 0 - 35 U/L   Total Protein 6.8 6.0 - 8.3 g/dL   Albumin 3.6 3.5 - 5.2 g/dL   Assessment Axis I bipolar disorder NOS, benzodiazepine abuse, alcohol abuse Axis II deferred Axis III see medical history Axis IV moderate Axis V 60-65  Plan/Discussion: She'll  discontinue amitriptyline for depression and sleep and start trazodone 100 mg daily at bedtime, continue Xanax for anxiety, and Depakote for mood disorder She'll return to see me in 2 months Time spent 15 minutes.  More than 50% of the time spent in psychoeducation, counseling and coordination of care.  Discuss safety plan that anytime having active suicidal thoughts or homicidal thoughts then patient need to call 911 or go to the local emergency room.  MEDICATIONS this encounter: Meds ordered this encounter  Medications  . divalproex (DEPAKOTE) 250 MG DR tablet    Sig: Take 3 tablets (750 mg total) by mouth at bedtime.    Dispense:  90 tablet    Refill:  2  . traZODone (DESYREL) 100 MG tablet    Sig: Take 1 tablet (100 mg total) by mouth at bedtime.    Dispense:  30 tablet    Refill:  2  . ALPRAZolam (XANAX) 0.5 MG tablet    Sig: Take 1 tablet (0.5 mg total) by mouth 2 (two) times daily. For anxiety    Dispense:  60 tablet    Refill:  2    Medical Decision Making Problem Points:  Established problem, stable/improving (1), Established problem, worsening (2), New problem, with no additional work-up planned (3), Review of last therapy session (1) and Review of psycho-social stressors (1) Data Points:  Review or order clinical lab tests (1) Review and summation of old records (2) Review of medication regiment & side effects (2) Review of new medications or change in dosage (2)  Diane Stehle, MD

## 2015-05-05 ENCOUNTER — Ambulatory Visit (INDEPENDENT_AMBULATORY_CARE_PROVIDER_SITE_OTHER): Payer: Commercial Managed Care - HMO | Admitting: Nurse Practitioner

## 2015-05-05 ENCOUNTER — Encounter: Payer: Self-pay | Admitting: Nurse Practitioner

## 2015-05-05 ENCOUNTER — Telehealth: Payer: Self-pay

## 2015-05-05 ENCOUNTER — Other Ambulatory Visit: Payer: Self-pay

## 2015-05-05 VITALS — BP 135/81 | HR 80 | Temp 97.3°F | Ht 63.0 in | Wt 233.8 lb

## 2015-05-05 DIAGNOSIS — K219 Gastro-esophageal reflux disease without esophagitis: Secondary | ICD-10-CM | POA: Diagnosis not present

## 2015-05-05 DIAGNOSIS — Z86010 Personal history of colonic polyps: Secondary | ICD-10-CM | POA: Diagnosis not present

## 2015-05-05 DIAGNOSIS — R1314 Dysphagia, pharyngoesophageal phase: Secondary | ICD-10-CM | POA: Diagnosis not present

## 2015-05-05 DIAGNOSIS — R1310 Dysphagia, unspecified: Secondary | ICD-10-CM

## 2015-05-05 DIAGNOSIS — K5900 Constipation, unspecified: Secondary | ICD-10-CM | POA: Diagnosis not present

## 2015-05-05 DIAGNOSIS — R1319 Other dysphagia: Secondary | ICD-10-CM

## 2015-05-05 MED ORDER — LINACLOTIDE 145 MCG PO CAPS: 145.0000 ug | ORAL_CAPSULE | Freq: Every day | ORAL | Status: DC

## 2015-05-05 MED ORDER — OMEPRAZOLE 20 MG PO CPDR: 20.0000 mg | DELAYED_RELEASE_CAPSULE | Freq: Two times a day (BID) | ORAL | Status: DC

## 2015-05-05 MED ORDER — PEG 3350-KCL-NA BICARB-NACL 420 G PO SOLR: 4000.0000 mL | Freq: Once | ORAL | Status: DC

## 2015-05-05 NOTE — Assessment & Plan Note (Signed)
Patient with GERD symptoms was recently started on Prilosec 20 mg. Her symptoms are improved, however still having weekly breakthrough symptoms. I'll increase her dose to 20 mg twice a day. Continue with land endoscopy as noted below. Also advised to avoid trigger foods, eat a low-fat diet.

## 2015-05-05 NOTE — Assessment & Plan Note (Addendum)
64 year old female with a history of adenomatous polyp on colonoscopy. Her last colonoscopy was approximately 9 years ago which found 2 polyps both of which were adenomatous. Her prep was noted to be inadequate. These reasons we'll proceed with a repeat colonoscopy as she is likely overdue for surveillance. Due to her previous inadequate prep we'll plan for an extended bowel prep including 2 days of clear liquids, split prep, and additional half dose of prep the day before.  Proceed with TCS with Dr. Jena Gauss in near future: the risks, benefits, and alternatives have been discussed with the patient in detail. The patient states understanding and desires to proceed.  Procedure and the OR propofol due to polypharmacy as the patient is on Xanax, Percocet, and trazodone. She is not currently on anticoagulants

## 2015-05-05 NOTE — Assessment & Plan Note (Signed)
Patient with chronic constipation, previously noted Amitiza is ineffective. She was advised at last visit to stop Amitiza, start Linzess 145 g and was given samples until she can pick up her prescription. When she finishes samples she went back to the Amitiza in order not wasting. I again advised her to stop Amitiza at his ineffective for her and start Linzess. I send a prescription to your pharmacy for 145 g Linzess. We'll also proceed with colonoscopy as noted below.

## 2015-05-05 NOTE — Patient Instructions (Signed)
1. I am increasing your dose of Prilosec to 20 mg twice a day. Take one in the morning before your first meal the day and one in the evening 30 minutes before your last meals a day.  2. Stop taking Amitiza. 3. Start taking Linzess. I've sent in a prescription to your pharmacy. 4. We will schedule your procedure for you.  5. Further recommendations to be based on results of your procedure 6. Return for follow-up in 3 months.

## 2015-05-05 NOTE — Progress Notes (Signed)
  Referring Provider: Fusco, Lawrence, MD Primary Care Physician:  FUSCO,LAWRENCE J., MD Primary GI:  Dr. Rourk  Chief Complaint  Patient presents with  . Follow-up    HPI:   64-year-old female presents for follow-up on GERD, constipation, and possible need for colonoscopy. We did receive her records from [facility] for a colonoscopy on 06/28/2006 which found pancolonic diverticula, 5 mm polyp in the base of the cecum which was much destroyed with removal and not recovered, a second 5 mm polyp in the mid ascending colon that was removed and found to be adenomatous on pathology. Prep was deemed marginal and other lesions may not have been seen. Recommend continue Prilosec, diverticulosis literature. Per endoscopist suspect low-volume hematochezia due to hemorrhoids and a ten-day Anusol suppository courses prescribed. Given polypectomy 2, one of which was adenomatous, and marginal prep on her colonoscopy which was 9 years ago, she is likely due for surveillance.  Today she states her GERD symptoms are much improved on PPI. Denies abdominal pain, N/V. Still has bitter regurgitation once a week. Still had for her to swallow, having dysphagia which happens nearly every day. She tends to have issues with all foods. Will sometimes have regurgitation but will also sometimes pass with time. Denies hematochezia, melena. Initially stated she is still constipated on Linzess 145 mcg daily, but on further questioning she clarified that she took the samples provided and then returned to Amitiza (which previously wasn't working) in order to finish them. Is not currently taking Linzess. Denies chest pain, dyspnea, dizziness, lightheadedness, syncope, near syncope. Denies any other upper or lower GI symptoms.   Past Medical History  Diagnosis Date  . Hypertension   . Allergic rhinitis   . Anxiety   . Depression   . GERD (gastroesophageal reflux disease)   . High cholesterol   . Hypothyroid   .  Chronic back pain   . Arthritis   . Immature cataract   . Sleep apnea     STOP BANG score=4  . Seizures 20 yrs ago    unknown etiology-on meds  . Headaches, cluster 3 yrs ago    last one  . Mania   . 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. PTSD (post-traumatic stress disorder)     Past Surgical History  Procedure Laterality Date  . Left foot  x 2  . Foot surgery      left foot-  . Partial hysterectomy    . Oophoectomy  bilateral  . Cholecystectomy    . Lumbar disc surgery  L4-5  . Back surgery    . Knee arthroscopy  01/2012    right  . Total knee arthroplasty  07/01/2012    Procedure: TOTAL KNEE ARTHROPLASTY;  Surgeon: Stanley E Harrison, MD;  Location: [facility];  Service: Orthopedics;  Laterality: Right;  . Total knee revision Right 01/03/2013    Procedure: Patellaplasty Right Knee;  Surgeon: Stanley E Harrison, MD;  Location: [facility];  Service: Orthopedics;  Laterality: Right;  . Knee arthroscopy with lateral release Right 01/03/2013    Procedure: Lateral Release Patella Right Knee;  Surgeon: Stanley E Harrison, MD;  Location: [facility];  Service: Orthopedics;  Laterality: Right;  . Incision and drainage Right 01/03/2013    Procedure: INCISION AND DRAINAGE;  Surgeon: Stanley E Harrison, MD;  Location: [facility];  Service: Orthopedics;  Laterality: Right;  . Esophagogastroduodenoscopy  10/16/2008    RMR:Small hiatal hernia otherwise normal esophagus, stomach  D1, D2, status post passage   of a 56-French Maloney dilator  . Colonoscopy  10/16/2008    RMR:Hemorrhoids, otherwise normal rectum; pancolonic diverticula the remainder of the colonic mucosa appeared normal. Next TCS due 10/2013.  . Esophagogastroduodenoscopy  06/28/2006    RMR:Normal esophagus. Small hiatal hernia. Otherwise normal stomach, D1 and D2, status post passage of a 56 French Maloney dilator  . Colonoscopy  06/28/2006    RMR: Internal hemorrhoids. Diminutive rectal polyps, cold biopsied/ Pancolonic diverticula. Polyps in the right  colon removed   . Esophagogastroduodenoscopy (egd) with esophageal dilation N/A 07/14/2013    RMR: Abnormal esophagus of uncertain significance-status post esophageal biospy. small hiatal hernia.     Current Outpatient Prescriptions  Medication Sig Dispense Refill  . ALPRAZolam (XANAX) 0.5 MG tablet Take 1 tablet (0.5 mg total) by mouth 2 (two) times daily. For anxiety 60 tablet 2  . cyclobenzaprine (FLEXERIL) 10 MG tablet Take 1 tablet (10 mg total) by mouth 3 (three) times daily. For spasms 60 tablet 5  . divalproex (DEPAKOTE) 250 MG DR tablet Take 3 tablets (750 mg total) by mouth at bedtime. 90 tablet 2  . levothyroxine (LEVOTHROID) 150 MCG tablet Take 175 mcg by mouth daily.     . lubiprostone (AMITIZA) 24 MCG capsule Take 24 mcg by mouth 2 (two) times daily with a meal.    . omeprazole (PRILOSEC) 20 MG capsule Take 1 capsule (20 mg total) by mouth daily. 30 capsule 3  . oxyCODONE-acetaminophen (PERCOCET) 7.5-325 MG per tablet Take 1 tablet by mouth every 6 (six) hours as needed. 120 tablet 0  . rosuvastatin (CRESTOR) 40 MG tablet Take 40 mg by mouth daily.      . traZODone (DESYREL) 100 MG tablet Take 1 tablet (100 mg total) by mouth at bedtime. 30 tablet 2  . Linaclotide (LINZESS) 145 MCG CAPS capsule Take 1 capsule (145 mcg total) by mouth daily. (Patient not taking: Reported on 05/05/2015) 14 capsule 0   No current facility-administered medications for this visit.    Allergies as of 05/05/2015 - Review Complete 05/05/2015  Allergen Reaction Noted  . Neomycin-bacitracin zn-polymyx Other (See Comments) 02/27/2008  . Penicillins Hives   . Sulfonamide derivatives Hives and Swelling 02/27/2008    Family History  Problem Relation Age of Onset  . Heart disease    . Diabetes    . Alcohol abuse    . Alcohol abuse Mother   . Anxiety disorder Mother   . Alcohol abuse Father   . Anxiety disorder Father   . Bipolar disorder Sister   . Alcohol abuse Brother   . Anxiety disorder  Brother   . Drug abuse Brother   . Dementia Paternal Aunt   . Depression Neg Hx   . OCD Neg Hx   . Paranoid behavior Neg Hx   . Schizophrenia Neg Hx   . Sexual abuse Neg Hx   . Physical abuse Neg Hx   . ADD / ADHD Grandchild   . ADD / ADHD Grandchild   . Drug abuse Brother   . Alcohol abuse Brother   . Anxiety disorder Brother   . Seizures Brother   . Alcohol abuse Brother   . Anxiety disorder Brother   . Seizures Brother   . Alcohol abuse Brother   . Anxiety disorder Brother   . Alcohol abuse Brother   . Anxiety disorder Brother   . Colon cancer Neg Hx     Social History   Social History  . Marital Status: Married      Spouse Name: N/A  . Number of Children: N/A  . Years of Education: 12th grade   Occupational History  . disabled    Social History Main Topics  . Smoking status: Former Smoker -- 0.25 packs/day for 5 years    Types: Cigarettes    Quit date: 06/26/2003  . Smokeless tobacco: Never Used  . Alcohol Use: Yes     Comment: occasionally  . Drug Use: No     Comment: has a past history of street drug use  . Sexual Activity: Yes    Birth Control/ Protection: Surgical   Other Topics Concern  . None   Social History Narrative    Review of Systems: General: Negative for anorexia, weight loss, fever, chills, fatigue, weakness. ENT: Negative for hoarseness. CV: Negative for chest pain, angina, palpitations, dyspnea on exertion, peripheral edema.  Respiratory: Negative for dyspnea at rest, dyspnea on exertion, cough, sputum, wheezing.  GI: See history of present illness. Endo: Negative for unusual weight change.  Heme: Negative for bruising or bleeding. Allergy: Negative for rash or hives.   Physical Exam: BP 135/81 mmHg  Pulse 80  Temp(Src) 97.3 F (36.3 C) (Oral)  Ht 5' 3" (1.6 m)  Wt 233 lb 12.8 oz (106.051 kg)  BMI 41.43 kg/m2 General:   Alert and oriented. Pleasant and cooperative. Well-nourished and well-developed.  Head:  Normocephalic and  atraumatic. Eyes:  Without icterus, sclera clear and conjunctiva pink.  Ears:  Normal auditory acuity. Cardiovascular:  S1, S2 present without murmurs appreciated. Normal pulses noted. Extremities without clubbing or edema. Respiratory:  Clear to auscultation bilaterally. No wheezes, rales, or rhonchi. No distress.  Gastrointestinal:  +BS, rounded, soft, and non-distended. Minimal LLQ TTP. No HSM noted. No guarding or rebound. No masses appreciated.  Rectal:  Deferred  Musculoskalatal:  Symmetrical without gross deformities. Normal posture. Skin:  Intact without significant lesions or rashes. Neurologic:  Alert and oriented x4;  grossly normal neurologically. Psych:  Alert and cooperative. Normal mood and affect. Heme/Lymph/Immune: No excessive bruising noted.    05/05/2015 9:29 AM  

## 2015-05-05 NOTE — Assessment & Plan Note (Addendum)
Patient with complaints of dysphagia which occurs daily and is noted with dry foods especially, however she does have issues with most foods. Occasional regurgitation. She is a history of dysphagia and has previously had her esophagus dilated. At this point we'll proceed with an endoscopy with possible dilation to further evaluate. This will also allow further evaluation of continued GERD symptoms on PPI as noted and GERD assessment and plan note.  Proceed with EGD +/- dilation with Dr. Jena Gauss in near future: the risks, benefits, and alternatives have been discussed with the patient in detail. The patient states understanding and desires to proceed.  Patient is on Xanax, Percocet, and trazodone. For these reasons we'll proceed with the procedure in the OR on propofol/MAC to ensure adequate sedation. She is not currently on any anticoagulants.

## 2015-05-05 NOTE — Telephone Encounter (Signed)
Pt is calling because her insurance company will not pay for her TCS until next year. She said that she would like to wait unless we though she needed it now. Please advise

## 2015-05-06 NOTE — Progress Notes (Signed)
CC'ED TO PCP 

## 2015-05-06 NOTE — Telephone Encounter (Signed)
I talked with the insurance company and she has a copay of $275.00 that she will have to pay but she can still have the TCS.

## 2015-05-06 NOTE — Telephone Encounter (Signed)
I talked with the patient and she said that we would have to call her insurance company. The number is [phone].

## 2015-05-06 NOTE — Telephone Encounter (Signed)
Pt is aware that she will only have to pay the copay

## 2015-05-06 NOTE — Telephone Encounter (Signed)
Please call her and let her know, based on the two 5mm polyps she had last time, recommended repeat is 5-10 years. Her last prep was inadequate making it difficult to see everything clearly. Her last colonoscopy was 9 years ago. I definitely recommend repeating her colonoscopy. If her insurance is balking, I'd be happy to discuss it with them.

## 2015-05-06 NOTE — Telephone Encounter (Signed)
I did a PA online and it said that she did not need a PA for the TCS. I am going to try to call

## 2015-05-13 NOTE — Patient Instructions (Signed)
Diane [name]  05/13/2015     @   Your procedure is scheduled on 05/20/2015.  Report to [facility] at 8:15 A.M.  Call this number if you have problems the morning of surgery:  [phone]   Remember:  Do not eat food or drink liquids after midnight.  Take these medicines the morning of surgery with A SIP OF WATER Xanax, Amlodipine, Flexeril, Depakote, Synthroid, Linzess, Prilosec, Ditropan, Percocet if needed   Do not wear jewelry, make-up or nail polish.  Do not wear lotions, powders, or perfumes.  You may wear deodorant.  Do not shave 48 hours prior to surgery.  Men may shave face and neck.  Do not bring valuables to the hospital.  [facility] is not responsible for any belongings or valuables.  Contacts, dentures or bridgework may not be worn into surgery.  Leave your suitcase in the car.  After surgery it may be brought to your room.  For patients admitted to the hospital, discharge time will be determined by your treatment team.  Patients discharged the day of surgery will not be allowed to drive home.   Please read over the following fact sheets that you were given. Anesthesia Post-op Instructions     PATIENT INSTRUCTIONS POST-ANESTHESIA  IMMEDIATELY FOLLOWING SURGERY:  Do not drive or operate machinery for the first twenty four hours after surgery.  Do not make any important decisions for twenty four hours after surgery or while taking narcotic pain medications or sedatives.  If you develop intractable nausea and vomiting or a severe headache please notify your doctor immediately.  FOLLOW-UP:  Please make an appointment with your surgeon as instructed. You do not need to follow up with anesthesia unless specifically instructed to do so.  WOUND CARE INSTRUCTIONS (if applicable):  Keep a dry clean dressing on the anesthesia/puncture wound site if there is drainage.  Once the wound has quit draining you may leave it open to air.  Generally you should  leave the bandage intact for twenty four hours unless there is drainage.  If the epidural site drains for more than 36-48 hours please call the anesthesia department.  QUESTIONS?:  Please feel free to call your physician or the hospital operator if you have any questions, and they will be happy to assist you.      Esophagogastroduodenoscopy Esophagogastroduodenoscopy (EGD) is a procedure to examine the lining of the esophagus, stomach, and first part of the small intestine (duodenum). A long, flexible, lighted tube with a camera attached (endoscope) is inserted down the throat to view these organs. This procedure is done to detect problems or abnormalities, such as inflammation, bleeding, ulcers, or growths, in order to treat them. The procedure lasts about 5-20 minutes. It is usually an outpatient procedure, but it may need to be performed in emergency cases in the hospital. LET YOUR CAREGIVER KNOW ABOUT:   Allergies to food or medicine.  All medicines you are taking, including vitamins, herbs, eyedrops, and over-the-counter medicines and creams.  Use of steroids (by mouth or creams).  Previous problems you or members of your family have had with the use of anesthetics.  Any blood disorders you have.  Previous surgeries you have had.  Other health problems you have.  Possibility of pregnancy, if this applies. RISKS AND COMPLICATIONS  Generally, EGD is a safe procedure. However, as with any procedure, complications can occur. Possible complications include:  Infection.  Bleeding.  Tearing (perforation) of the esophagus, stomach, or duodenum.  Difficulty breathing or not being able to breath.  Excessive sweating.  Spasms of the larynx.  Slowed heartbeat.  Low blood pressure. BEFORE THE PROCEDURE  Do not eat or drink anything for 6-8 hours before the procedure or as directed by your caregiver.  Ask your caregiver about changing or stopping your regular medicines.  If you  wear dentures, be prepared to remove them before the procedure.  Arrange for someone to drive you home after the procedure. PROCEDURE   A vein will be accessed to give medicines and fluids. A medicine to relax you (sedative) and a pain reliever will be given through that access into the vein.  A numbing medicine (local anesthetic) may be sprayed on your throat for comfort and to stop you from gagging or coughing.  A mouth guard may be placed in your mouth to protect your teeth and to keep you from biting on the endoscope.  You will be asked to lie on your left side.  The endoscope is inserted down your throat and into the esophagus, stomach, and duodenum.  Air is put through the endoscope to allow your caregiver to view the lining of your esophagus clearly.  The esophagus, stomach, and duodenum is then examined. During the exam, your caregiver may:  Remove tissue to be examined under a microscope (biopsy) for inflammation, infection, or other medical problems.  Remove growths.  Remove objects (foreign bodies) that are stuck.  Treat any bleeding with medicines or other devices that stop tissues from bleeding (hot cautery, clipping devices).  Widen (dilate) or stretch narrowed areas of the esophagus and stomach.  The endoscope will then be withdrawn. AFTER THE PROCEDURE  You will be taken to a recovery area to be monitored. You will be able to go home once you are stable and alert.  Do not eat or drink anything until the local anesthetic and numbing medicines have worn off. You may choke.  It is normal to feel bloated, have pain with swallowing, or have a sore throat for a short time. This will wear off.  Your caregiver should be able to discuss his or her findings with you. It will take longer to discuss the test results if any biopsies were taken. Document Released: 12/29/2004 Document Revised: 01/12/2014 Document Reviewed: 07/31/2012 [facility] Patient Information 2015  [employer], [place]. This information is not intended to replace advice given to you by your health care provider. Make sure you discuss any questions you have with your health care provider. Esophageal Dilatation The esophagus is the long, narrow tube which carries food and liquid from the mouth to the stomach. Esophageal dilatation is the technique used to stretch a blocked or narrowed portion of the esophagus. This procedure is used when a part of the esophagus has become so narrow that it becomes difficult, painful or even impossible to swallow. This is generally an uncomplicated form of treatment. When this is not successful, chest surgery may be required. This is a much more extensive form of treatment with a longer recovery time. CAUSES  Some of the more common causes of blockage or strictures of the esophagus are:  Narrowing from longstanding inflammation (soreness and redness) of the lower esophagus. This comes from the constant exposure of the lower esophagus to the acid which bubbles up from the stomach. Over time this causes scarring and narrowing of the lower esophagus.  Hiatal hernia in which a small part of the stomach bulges (herniates) up through the diaphragm. This can cause a gradual  narrowing of the end of the esophagus.  Schatzki ring is a narrow ring of benign (non-cancerous) fibrous tissue which constricts the lower esophagus. The reason for this is not known.  Scleroderma is a connective tissue disorder that affects the esophagus and makes swallowing difficult.  Achalasia is an absence of nerves to the lower esophagus and to the esophageal sphincter. This is the circular muscle between the stomach and esophagus that relaxes to allow food into the stomach. After swallowing, it contracts to keep food in the stomach. This absence of nerves may be congenital (present since birth). This can cause irregular spasms of the lower esophageal muscle. This spasm does not open up to allow food and  fluid through. The result is a persistent blockage with subsequent slow trickling of the esophageal contents into the stomach.  Strictures may develop from swallowing materials which damage the esophagus. Some examples are strong acids or alkalis such as lye.  Growths such as benign (non-cancerous) and malignant (cancerous) tumors can block the esophagus.  Hereditary (present since birth) causes. DIAGNOSIS  Your caregiver often suspects this problem by taking a medical history. They will also do a physical exam. They can then prove their suspicions using X-rays and endoscopy. Endoscopy is an exam in which a tube like a small, flexible telescope is used to look at your esophagus.  TREATMENT There are different stretching (dilating) techniques that can be used. Simple bougie dilatation may be done in the office. This usually takes only a couple minutes. A numbing (anesthetic) spray of the throat is used. Endoscopy, when done, is done in an endoscopy suite under mild sedation. When fluoroscopy is used, the procedure is performed in X-ray. Other techniques require a little longer time. Recovery is usually quick. There is no waiting time to begin eating and drinking to test success of the treatment. Following are some of the methods used. Narrowing of the esophagus is treated by making it bigger. Commonly this is a mechanical problem which can be treated with stretching. This can be done in different ways. Your caregiver will discuss these with you. Some of the means used are:  A series of graduated (increasing thickness) flexible dilators can be used. These are weighted tubes passed through the esophagus into the stomach. The tubes used become progressively larger until the desired stretched size is reached. Graduated dilators are a simple and quick way of opening the esophagus. No visualization is required.  Another method is the use of endoscopy to place a flexible wire across the stricture. The  endoscope is removed and the wire left in place. A dilator with a hole through it from end to end is guided down the esophagus and across the stricture. One or more of these dilators are passed over the wire. At the end of the exam, the wire is removed. This type of treatment may be performed in the X-ray department under fluoroscopy. An advantage of this procedure is the examiner is visualizing the end opening in the esophagus.  Stretching of the esophagus may be done using balloons. Deflated balloons are placed through the endoscope and across the stricture. This type of balloon dilatation is often done at the time of endoscopy or fluoroscopy. Flexible endoscopy allows the examiner to directly view the stricture. A balloon is inserted in the deflated form into the area of narrowing. It is then inflated with air to a certain pressure that is preset for a given circumference. When inflated, it becomes sausage shaped, stretched, and makes  the stricture larger.  Achalasia requires a longer, larger balloon-type dilator. This is frequently done under X-ray control. In this situation, the spastic muscle fibers in the lower esophagus are stretched. All of the above procedures make the passage of food and water into the stomach easier. They also make it easier for stomach contents to reflux back into the esophagus. Special medications may be used following the procedure to help prevent further stricturing. Proton-pump inhibitor medications are good at decreasing the amount of acid in the stomach juice. When stomach juice refluxes into the esophagus, the juice is no longer as acidic and is less likely to burn or scar the esophagus. RISKS AND COMPLICATIONS Esophageal dilatation is usually performed effectively and without problems. Some complications that can occur are:  A small amount of bleeding almost always happens where the stretching takes place. If this is too excessive it may require more aggressive  treatment.  An uncommon complication is perforation (making a hole) of the esophagus. The esophagus is thin. It is easy to make a hole in it. If this happens, an operation may be necessary to repair this.  A small, undetected perforation could lead to an infection in the chest. This can be very serious. HOME CARE INSTRUCTIONS   If you received sedation for your procedure, do not drive, make important decisions, or perform any activities requiring your full coordination. Do not drink alcohol, take sedatives, or use any mind altering chemicals unless instructed by your caregiver.  You may use throat lozenges or warm salt water gargles if you have throat discomfort.  You can begin eating and drinking normally on return home unless instructed otherwise. Do not purposely try to force large chunks of food down to test the benefits of your procedure.  Mild discomfort can be eased with sips of ice water.  Medications for discomfort may or may not be needed. SEEK IMMEDIATE MEDICAL CARE IF:   You begin vomiting up blood.  You develop black, tarry stools.  You develop chills or an unexplained temperature of over 101F (38.3C)  You develop chest or abdominal pain.  You develop shortness of breath, or feel light-headed or faint.  Your swallowing is becoming more painful, difficult, or you are unable to swallow. MAKE SURE YOU:   Understand these instructions.  Will watch your condition.  Will get help right away if you are not doing well or get worse. Document Released: 10/19/2005 Document Revised: 01/12/2014 Document Reviewed: 12/06/2005 [facility] Patient Information 2015 [place], [place]. This information is not intended to replace advice given to you by your health care provider. Make sure you discuss any questions you have with your health care provider. Colonoscopy A colonoscopy is an exam to look at the entire large intestine (colon). This exam can help find problems such as tumors,  polyps, inflammation, and areas of bleeding. The exam takes about 1 hour.  LET [facility] CARE PROVIDER KNOW ABOUT:   Any allergies you have.  All medicines you are taking, including vitamins, herbs, eye drops, creams, and over-the-counter medicines.  Previous problems you or members of your family have had with the use of anesthetics.  Any blood disorders you have.  Previous surgeries you have had.  Medical conditions you have. RISKS AND COMPLICATIONS  Generally, this is a safe procedure. However, as with any procedure, complications can occur. Possible complications include:  Bleeding.  Tearing or rupture of the colon wall.  Reaction to medicines given during the exam.  Infection (rare). BEFORE THE PROCEDURE  Ask your health care provider about changing or stopping your regular medicines.  You may be prescribed an oral bowel prep. This involves drinking a large amount of medicated liquid, starting the day before your procedure. The liquid will cause you to have multiple loose stools until your stool is almost clear or light green. This cleans out your colon in preparation for the procedure.  Do not eat or drink anything else once you have started the bowel prep, unless your health care provider tells you it is safe to do so.  Arrange for someone to drive you home after the procedure. PROCEDURE   You will be given medicine to help you relax (sedative).  You will lie on your side with your knees bent.  A long, flexible tube with a light and camera on the end (colonoscope) will be inserted through the rectum and into the colon. The camera sends video back to a computer screen as it moves through the colon. The colonoscope also releases carbon dioxide gas to inflate the colon. This helps your health care provider see the area better.  During the exam, your health care provider may take a small tissue sample (biopsy) to be examined under a microscope if any abnormalities are  found.  The exam is finished when the entire colon has been viewed. AFTER THE PROCEDURE   Do not drive for 24 hours after the exam.  You may have a small amount of blood in your stool.  You may pass moderate amounts of gas and have mild abdominal cramping or bloating. This is caused by the gas used to inflate your colon during the exam.  Ask when your test results will be ready and how you will get your results. Make sure you get your test results. Document Released: 08/25/2000 Document Revised: 06/18/2013 Document Reviewed: 05/05/2013 [facility] Patient Information 2015 [place], [place]. This information is not intended to replace advice given to you by your health care provider. Make sure you discuss any questions you have with your health care provider.

## 2015-05-18 ENCOUNTER — Encounter (HOSPITAL_COMMUNITY)
Admission: RE | Admit: 2015-05-18 | Discharge: 2015-05-18 | Disposition: A | Discharge status: Home / Self Care | Payer: Commercial Managed Care - HMO | Source: Ambulatory Visit | Attending: Internal Medicine | Admitting: Internal Medicine

## 2015-05-18 ENCOUNTER — Telehealth: Payer: Self-pay | Admitting: Internal Medicine

## 2015-05-18 NOTE — Telephone Encounter (Signed)
Spoke with pt. She is aware of pre-op tomorrow 05/19/2015 @ 2:00pm. Pt states "I will be there"

## 2015-05-18 NOTE — Telephone Encounter (Signed)
Selena Batten, RN called from short stay to let us know that patient did not show up for her pre op appointment today.

## 2015-05-19 ENCOUNTER — Other Ambulatory Visit: Payer: Self-pay

## 2015-05-19 ENCOUNTER — Encounter (HOSPITAL_COMMUNITY)
Admission: RE | Admit: 2015-05-19 | Discharge: 2015-05-19 | Disposition: A | Discharge status: Home / Self Care | Payer: Commercial Managed Care - HMO | Source: Ambulatory Visit | Attending: Internal Medicine | Admitting: Internal Medicine

## 2015-05-19 DIAGNOSIS — F418 Other specified anxiety disorders: Secondary | ICD-10-CM | POA: Diagnosis not present

## 2015-05-19 DIAGNOSIS — I10 Essential (primary) hypertension: Secondary | ICD-10-CM | POA: Diagnosis not present

## 2015-05-19 DIAGNOSIS — Z1211 Encounter for screening for malignant neoplasm of colon: Secondary | ICD-10-CM | POA: Diagnosis not present

## 2015-05-19 DIAGNOSIS — Q438 Other specified congenital malformations of intestine: Secondary | ICD-10-CM | POA: Diagnosis not present

## 2015-05-19 DIAGNOSIS — E780 Pure hypercholesterolemia: Secondary | ICD-10-CM | POA: Diagnosis not present

## 2015-05-19 DIAGNOSIS — E039 Hypothyroidism, unspecified: Secondary | ICD-10-CM | POA: Diagnosis not present

## 2015-05-19 DIAGNOSIS — K219 Gastro-esophageal reflux disease without esophagitis: Secondary | ICD-10-CM | POA: Diagnosis not present

## 2015-05-19 DIAGNOSIS — F4310 Post-traumatic stress disorder, unspecified: Secondary | ICD-10-CM | POA: Diagnosis not present

## 2015-05-19 DIAGNOSIS — R1310 Dysphagia, unspecified: Secondary | ICD-10-CM | POA: Diagnosis present

## 2015-05-19 DIAGNOSIS — Z79899 Other long term (current) drug therapy: Secondary | ICD-10-CM | POA: Diagnosis not present

## 2015-05-19 DIAGNOSIS — K5730 Diverticulosis of large intestine without perforation or abscess without bleeding: Secondary | ICD-10-CM | POA: Diagnosis not present

## 2015-05-19 DIAGNOSIS — G4730 Sleep apnea, unspecified: Secondary | ICD-10-CM | POA: Diagnosis not present

## 2015-05-19 DIAGNOSIS — R569 Unspecified convulsions: Secondary | ICD-10-CM | POA: Diagnosis not present

## 2015-05-19 DIAGNOSIS — M1990 Unspecified osteoarthritis, unspecified site: Secondary | ICD-10-CM | POA: Diagnosis not present

## 2015-05-19 DIAGNOSIS — Z01812 Encounter for preprocedural laboratory examination: Secondary | ICD-10-CM | POA: Diagnosis not present

## 2015-05-19 LAB — CBC WITH DIFFERENTIAL/PLATELET
Basophils Absolute: 0 10*3/uL (ref 0.0–0.1)
Basophils Relative: 0 % (ref 0–1)
Eosinophils Absolute: 0 10*3/uL (ref 0.0–0.7)
Eosinophils Relative: 1 % (ref 0–5)
HCT: 39.2 % (ref 36.0–46.0)
Hemoglobin: 12.7 g/dL (ref 12.0–15.0)
Lymphocytes Relative: 48 % — ABNORMAL HIGH (ref 12–46)
Lymphs Abs: 3.2 10*3/uL (ref 0.7–4.0)
MCH: 27.2 pg (ref 26.0–34.0)
MCHC: 32.4 g/dL (ref 30.0–36.0)
MCV: 83.9 fL (ref 78.0–100.0)
Monocytes Absolute: 0.7 10*3/uL (ref 0.1–1.0)
Monocytes Relative: 10 % (ref 3–12)
Neutro Abs: 2.8 10*3/uL (ref 1.7–7.7)
Neutrophils Relative %: 41 % — ABNORMAL LOW (ref 43–77)
Platelets: 183 10*3/uL (ref 150–400)
RBC: 4.67 MIL/uL (ref 3.87–5.11)
RDW: 12.7 % (ref 11.5–15.5)
WBC: 6.7 10*3/uL (ref 4.0–10.5)

## 2015-05-19 LAB — BASIC METABOLIC PANEL
Anion gap: 7 (ref 5–15)
BUN: 18 mg/dL (ref 6–20)
CO2: 30 mmol/L (ref 22–32)
Calcium: 8.7 mg/dL — ABNORMAL LOW (ref 8.9–10.3)
Chloride: 105 mmol/L (ref 101–111)
Creatinine, Ser: 0.92 mg/dL (ref 0.44–1.00)
GFR calc Af Amer: >60 mL/min (ref >60)
GFR calc non Af Amer: >60 mL/min (ref >60)
Glucose, Bld: 82 mg/dL (ref 65–99)
Potassium: 3.6 mmol/L (ref 3.5–5.1)
Sodium: 142 mmol/L (ref 135–145)

## 2015-05-19 LAB — SURGICAL PCR SCREEN
MRSA, PCR: NEGATIVE
Staphylococcus aureus: NEGATIVE

## 2015-05-19 NOTE — Progress Notes (Signed)
   05/19/15 1430  OBSTRUCTIVE SLEEP APNEA  Have you ever been diagnosed with sleep apnea through a sleep study? Yes  If yes, do you have and use a CPAP or BPAP machine every night? 0  Do you snore loudly (loud enough to be heard through closed doors)?  0  Do you often feel tired, fatigued, or sleepy during the daytime (such as falling asleep during driving or talking to someone)? 1  Has anyone observed you stop breathing during your sleep? 1  Do you have, or are you being treated for high blood pressure? 0  BMI more than 35 kg/m2? 1  Age > 50 (1-yes) 1  Neck circumference greater than:Female 16 inches or larger, Female 17inches or larger? 1  Female Gender (Yes=1) 0  Obstructive Sleep Apnea Score 5  Score 5 or greater  Results sent to PCP

## 2015-05-20 ENCOUNTER — Ambulatory Visit (HOSPITAL_COMMUNITY): Payer: Commercial Managed Care - HMO | Admitting: Anesthesiology

## 2015-05-20 ENCOUNTER — Encounter (HOSPITAL_COMMUNITY)
Admission: RE | Disposition: A | Discharge status: Home / Self Care | Payer: Self-pay | Source: Ambulatory Visit | Attending: Internal Medicine

## 2015-05-20 ENCOUNTER — Ambulatory Visit (HOSPITAL_COMMUNITY)
Admission: RE | Admit: 2015-05-20 | Discharge: 2015-05-20 | Disposition: A | Discharge status: Home / Self Care | Payer: Commercial Managed Care - HMO | Source: Ambulatory Visit | Attending: Internal Medicine | Admitting: Internal Medicine

## 2015-05-20 ENCOUNTER — Encounter (HOSPITAL_COMMUNITY): Payer: Self-pay | Admitting: *Deleted

## 2015-05-20 DIAGNOSIS — Z86010 Personal history of colonic polyps: Secondary | ICD-10-CM | POA: Diagnosis not present

## 2015-05-20 DIAGNOSIS — Z1211 Encounter for screening for malignant neoplasm of colon: Secondary | ICD-10-CM | POA: Insufficient documentation

## 2015-05-20 DIAGNOSIS — R1310 Dysphagia, unspecified: Secondary | ICD-10-CM | POA: Diagnosis not present

## 2015-05-20 DIAGNOSIS — K219 Gastro-esophageal reflux disease without esophagitis: Secondary | ICD-10-CM | POA: Insufficient documentation

## 2015-05-20 DIAGNOSIS — R1314 Dysphagia, pharyngoesophageal phase: Secondary | ICD-10-CM | POA: Insufficient documentation

## 2015-05-20 DIAGNOSIS — Q438 Other specified congenital malformations of intestine: Secondary | ICD-10-CM | POA: Insufficient documentation

## 2015-05-20 DIAGNOSIS — I10 Essential (primary) hypertension: Secondary | ICD-10-CM | POA: Insufficient documentation

## 2015-05-20 DIAGNOSIS — E780 Pure hypercholesterolemia: Secondary | ICD-10-CM | POA: Insufficient documentation

## 2015-05-20 DIAGNOSIS — Z01812 Encounter for preprocedural laboratory examination: Secondary | ICD-10-CM | POA: Insufficient documentation

## 2015-05-20 DIAGNOSIS — M1990 Unspecified osteoarthritis, unspecified site: Secondary | ICD-10-CM | POA: Insufficient documentation

## 2015-05-20 DIAGNOSIS — R569 Unspecified convulsions: Secondary | ICD-10-CM | POA: Insufficient documentation

## 2015-05-20 DIAGNOSIS — G4730 Sleep apnea, unspecified: Secondary | ICD-10-CM | POA: Insufficient documentation

## 2015-05-20 DIAGNOSIS — K5730 Diverticulosis of large intestine without perforation or abscess without bleeding: Secondary | ICD-10-CM | POA: Diagnosis not present

## 2015-05-20 DIAGNOSIS — F4310 Post-traumatic stress disorder, unspecified: Secondary | ICD-10-CM | POA: Insufficient documentation

## 2015-05-20 DIAGNOSIS — Z79899 Other long term (current) drug therapy: Secondary | ICD-10-CM | POA: Insufficient documentation

## 2015-05-20 DIAGNOSIS — F418 Other specified anxiety disorders: Secondary | ICD-10-CM | POA: Insufficient documentation

## 2015-05-20 DIAGNOSIS — E039 Hypothyroidism, unspecified: Secondary | ICD-10-CM | POA: Insufficient documentation

## 2015-05-20 HISTORY — PX: MALONEY DILATION: SHX5535

## 2015-05-20 HISTORY — PX: ESOPHAGOGASTRODUODENOSCOPY (EGD) WITH PROPOFOL: SHX5813

## 2015-05-20 HISTORY — PX: COLONOSCOPY WITH PROPOFOL: SHX5780

## 2015-05-20 SURGERY — ESOPHAGOGASTRODUODENOSCOPY (EGD) WITH PROPOFOL
Anesthesia: Monitor Anesthesia Care | Site: Esophagus

## 2015-05-20 MED ORDER — LACTATED RINGERS IV SOLN
INTRAVENOUS | Status: DC
  Administered 2015-05-20: 1000 mL via INTRAVENOUS

## 2015-05-20 MED ORDER — PROPOFOL 10 MG/ML IV BOLUS
INTRAVENOUS | Status: AC
  Filled 2015-05-20: qty 20

## 2015-05-20 MED ORDER — ONDANSETRON HCL 4 MG/2ML IJ SOLN: 4.0000 mg | Freq: Once | INTRAMUSCULAR | Status: DC | PRN

## 2015-05-20 MED ORDER — PROPOFOL INFUSION 10 MG/ML OPTIME
INTRAVENOUS | Status: DC | PRN
  Administered 2015-05-20: 125 ug/kg/min via INTRAVENOUS

## 2015-05-20 MED ORDER — ONDANSETRON HCL 4 MG/2ML IJ SOLN
INTRAMUSCULAR | Status: AC
Start: 2015-05-20 — End: 2015-05-20
  Filled 2015-05-20: qty 2

## 2015-05-20 MED ORDER — WATER FOR IRRIGATION, STERILE IR SOLN
Status: DC | PRN
  Administered 2015-05-20: 1000 mL via SURGICAL_CAVITY

## 2015-05-20 MED ORDER — LIDOCAINE HCL (PF) 1 % IJ SOLN
INTRAMUSCULAR | Status: AC
  Filled 2015-05-20: qty 5

## 2015-05-20 MED ORDER — GLYCOPYRROLATE 0.2 MG/ML IJ SOLN
0.2000 mg | Freq: Once | INTRAMUSCULAR | Status: AC
  Administered 2015-05-20: 0.2 mg via INTRAVENOUS

## 2015-05-20 MED ORDER — SIMETHICONE 40 MG/0.6ML PO SUSP
ORAL | Status: DC | PRN
  Administered 2015-05-20: 1000 mL

## 2015-05-20 MED ORDER — LIDOCAINE VISCOUS 2 % MT SOLN
5.0000 mL | Freq: Two times a day (BID) | OROMUCOSAL | Status: AC
  Administered 2015-05-20 (×2): 5 mL via OROMUCOSAL

## 2015-05-20 MED ORDER — FENTANYL CITRATE (PF) 100 MCG/2ML IJ SOLN: 25.0000 ug | INTRAMUSCULAR | Status: DC | PRN

## 2015-05-20 MED ORDER — LIDOCAINE VISCOUS 2 % MT SOLN
OROMUCOSAL | Status: AC
  Filled 2015-05-20: qty 15

## 2015-05-20 MED ORDER — FENTANYL CITRATE (PF) 100 MCG/2ML IJ SOLN
25.0000 ug | INTRAMUSCULAR | Status: AC
  Administered 2015-05-20 (×2): 25 ug via INTRAVENOUS

## 2015-05-20 MED ORDER — MIDAZOLAM HCL 2 MG/2ML IJ SOLN
1.0000 mg | INTRAMUSCULAR | Status: DC | PRN
  Administered 2015-05-20: 2 mg via INTRAVENOUS

## 2015-05-20 MED ORDER — MIDAZOLAM HCL 2 MG/2ML IJ SOLN
INTRAMUSCULAR | Status: AC
  Filled 2015-05-20: qty 2

## 2015-05-20 MED ORDER — PROPOFOL 10 MG/ML IV BOLUS
INTRAVENOUS | Status: DC | PRN
  Administered 2015-05-20 (×3): 10 mg via INTRAVENOUS

## 2015-05-20 MED ORDER — ONDANSETRON HCL 4 MG/2ML IJ SOLN
4.0000 mg | Freq: Once | INTRAMUSCULAR | Status: AC
  Administered 2015-05-20: 4 mg via INTRAVENOUS

## 2015-05-20 MED ORDER — GLYCOPYRROLATE 0.2 MG/ML IJ SOLN
INTRAMUSCULAR | Status: AC
  Filled 2015-05-20: qty 1

## 2015-05-20 MED ORDER — FENTANYL CITRATE (PF) 100 MCG/2ML IJ SOLN
INTRAMUSCULAR | Status: AC
  Filled 2015-05-20: qty 2

## 2015-05-20 SURGICAL SUPPLY — 25 items
BLOCK BITE 60FR ADLT L/F BLUE (MISCELLANEOUS) ×3 IMPLANT
DEVICE CLIP HEMOSTAT 235CM (CLIP) IMPLANT
ELECT REM PT RETURN 9FT ADLT (ELECTROSURGICAL)
ELECTRODE REM PT RTRN 9FT ADLT (ELECTROSURGICAL) IMPLANT
FCP BXJMBJMB 240X2.8X (CUTTING FORCEPS)
FLOOR PAD 36X40 (MISCELLANEOUS)
FORCEPS BIOP RAD 4 LRG CAP 4 (CUTTING FORCEPS) ×3 IMPLANT
FORCEPS BIOP RJ4 240 W/NDL (CUTTING FORCEPS)
FORCEPS BXJMBJMB 240X2.8X (CUTTING FORCEPS) IMPLANT
FORMALIN 10 PREFIL 20ML (MISCELLANEOUS) IMPLANT
INJECTOR/SNARE I SNARE (MISCELLANEOUS) IMPLANT
KIT ENDO PROCEDURE PEN (KITS) ×3 IMPLANT
MANIFOLD NEPTUNE II (INSTRUMENTS) ×3 IMPLANT
NEEDLE SCLEROTHERAPY 25GX240 (NEEDLE) IMPLANT
PAD FLOOR 36X40 (MISCELLANEOUS) IMPLANT
PROBE APC STR FIRE (PROBE) IMPLANT
PROBE INJECTION GOLD (MISCELLANEOUS)
PROBE INJECTION GOLD 7FR (MISCELLANEOUS) IMPLANT
SNARE ROTATE MED OVAL 20MM (MISCELLANEOUS) IMPLANT
SNARE SHORT THROW 13M SML OVAL (MISCELLANEOUS) IMPLANT
SYR 50ML LL SCALE MARK (SYRINGE) ×3 IMPLANT
SYR INFLATION 60ML (SYRINGE) ×3 IMPLANT
TRAP SPECIMEN MUCOUS 40CC (MISCELLANEOUS) IMPLANT
TUBING IRRIGATION ENDOGATOR (MISCELLANEOUS) ×3 IMPLANT
WATER STERILE IRR 1000ML POUR (IV SOLUTION) ×3 IMPLANT

## 2015-05-20 NOTE — Interval H&P Note (Signed)
History and Physical Interval Note:  05/20/2015 9:43 AM  Diane [name]  has presented today for surgery, with the diagnosis of GERD, history of polyps, constipation  The various methods of treatment have been discussed with the patient and family. After consideration of risks, benefits and other options for treatment, the patient has consented to  Procedure(s) with comments: COLONOSCOPY WITH PROPOFOL (N/A) - 1230 - moved to 9:45  ESOPHAGOGASTRODUODENOSCOPY (EGD) WITH PROPOFOL (N/A) MALONEY DILATION (N/A) as a surgical intervention .  The patient's history has been reviewed, patient examined, no change in status, stable for surgery.  I have reviewed the patient's chart and labs.  Questions were answered to the patient's satisfaction.     Robert Rourk  No change; EGD w ED and TCS per plan.  The risks, benefits, limitations, imponderables and alternatives regarding both EGD and colonoscopy have been reviewed with the patient. Questions have been answered. All parties agreeable.

## 2015-05-20 NOTE — Op Note (Signed)
[facility] [address] [place], [address]   COLONOSCOPY PROCEDURE REPORT  PATIENT: Diane [name], Diane [name]  MR#: [id number] BIRTHDATE: 1951/02/02 , 63  yrs. old GENDER: female ENDOSCOPIST: R.  Roetta Sessions, MD FACP [facility] REFERRED XB:JYNWG Sherwood Gambler, M.D. PROCEDURE DATE:  Jun 16, 2015 PROCEDURE:   Colonoscopy, surveillance INDICATIONS:History of colonic adenoma. MEDICATIONS: Deep sedation per Dr.  [facility] ASA CLASS:       Class II  CONSENT: The risks, benefits, alternatives and imponderables including but not limited to bleeding, perforation as well as the possibility of a missed lesion have been reviewed.  The potential for biopsy, lesion removal, etc. have also been discussed. Questions have been answered.  All parties agreeable.  Please see the history and physical in the medical record for more information.  DESCRIPTION OF PROCEDURE:   After the risks benefits and alternatives of the procedure were thoroughly explained, informed consent was obtained.  The digital rectal exam revealed no abnormalities of the rectum.   The     endoscope was introduced through the anus and advanced to the cecum, which was identified by both the appendix and ileocecal valve. No adverse events experienced.   The quality of the prep was adequate  The instrument was then slowly withdrawn as the colon was fully examined. Estimated blood loss is zero unless otherwise noted in this procedure report.      COLON FINDINGS: Normal-appearing rectal mucosa.  Redundant colon. Pancolonic diverticulosis; otherwise, the remainder of the colonic mucosa appeared normal.  Retroflexion was performed. .  Withdrawal time=9 minutes 0 seconds.  The scope was withdrawn and the procedure completed. COMPLICATIONS: There were no immediate complications.  ENDOSCOPIC IMPRESSION: Pancolonic diverticulosis. Redundant colon  RECOMMENDATIONS: Recommend repeat colonoscopy in 5 years. Office visit  with [place] in 6 months. See EGD report.  eSigned:  R. Roetta Sessions, MD Jerrel Ivory [facility] Jun 16, 2015 10:43 AM   cc:  CPT CODES: ICD CODES:  The ICD and CPT codes recommended by this software are interpretations from the data that the clinical staff has captured with the software.  The verification of the translation of this report to the ICD and CPT codes and modifiers is the sole responsibility of the health care institution and practicing physician where this report was generated.  [employer] will not be held responsible for the validity of the ICD and CPT codes included on this report.  AMA assumes no liability for data contained or not contained herein. CPT is a Publishing rights manager of the [employer].  PATIENT NAME:  [name], [name] MR#: [id number]

## 2015-05-20 NOTE — Discharge Instructions (Signed)
EGD Discharge instructions Please read the instructions outlined below and refer to this sheet in the next few weeks. These discharge instructions provide you with general information on caring for yourself after you leave the hospital. Your doctor may also give you specific instructions. While your treatment has been planned according to the most current medical practices available, unavoidable complications occasionally occur. If you have any problems or questions after discharge, please call your doctor. ACTIVITY  You may resume your regular activity but move at a slower pace for the next 24 hours.   Take frequent rest periods for the next 24 hours.   Walking will help expel (get rid of) the air and reduce the bloated feeling in your abdomen.   No driving for 24 hours (because of the anesthesia (medicine) used during the test).   You may shower.   Do not sign any important legal documents or operate any machinery for 24 hours (because of the anesthesia used during the test).  NUTRITION  Drink plenty of fluids.   You may resume your normal diet.   Begin with a light meal and progress to your normal diet.   Avoid alcoholic beverages for 24 hours or as instructed by your caregiver.  MEDICATIONS  You may resume your normal medications unless your caregiver tells you otherwise.  WHAT YOU CAN EXPECT TODAY  You may experience abdominal discomfort such as a feeling of fullness or gas pains.  FOLLOW-UP  Your doctor will discuss the results of your test with you.  SEEK IMMEDIATE MEDICAL ATTENTION IF ANY OF THE FOLLOWING OCCUR:  Excessive nausea (feeling sick to your stomach) and/or vomiting.   Severe abdominal pain and distention (swelling).   Trouble swallowing.   Temperature over 101 F (37.8 C).   Rectal bleeding or vomiting of blood.    Continue Prilosec 20 mg daily  GERD information provided  Repeat colonoscopy in 5 years  Office visit with [place] in 6  months    Gastroesophageal Reflux Disease, Adult Gastroesophageal reflux disease (GERD) happens when acid from your stomach flows up into the esophagus. When acid comes in contact with the esophagus, the acid causes soreness (inflammation) in the esophagus. Over time, GERD may create small holes (ulcers) in the lining of the esophagus. CAUSES   Increased body weight. This puts pressure on the stomach, making acid rise from the stomach into the esophagus.  Smoking. This increases acid production in the stomach.  Drinking alcohol. This causes decreased pressure in the lower esophageal sphincter (valve or ring of muscle between the esophagus and stomach), allowing acid from the stomach into the esophagus.  Late evening meals and a full stomach. This increases pressure and acid production in the stomach.  A malformed lower esophageal sphincter. Sometimes, no cause is found. SYMPTOMS   Burning pain in the lower part of the mid-chest behind the breastbone and in the mid-stomach area. This may occur twice a week or more often.  Trouble swallowing.  Sore throat.  Dry cough.  Asthma-like symptoms including chest tightness, shortness of breath, or wheezing. DIAGNOSIS  Your caregiver may be able to diagnose GERD based on your symptoms. In some cases, X-rays and other tests may be done to check for complications or to check the condition of your stomach and esophagus. TREATMENT  Your caregiver may recommend over-the-counter or prescription medicines to help decrease acid production. Ask your caregiver before starting or adding any new medicines.  HOME CARE INSTRUCTIONS   Change the factors that you  can control. Ask your caregiver for guidance concerning weight loss, quitting smoking, and alcohol consumption.  Avoid foods and drinks that make your symptoms worse, such as:  Caffeine or alcoholic drinks.  Chocolate.  Peppermint or mint flavorings.  Garlic and onions.  Spicy  foods.  Citrus fruits, such as oranges, lemons, or limes.  Tomato-based foods such as sauce, chili, salsa, and pizza.  Fried and fatty foods.  Avoid lying down for the 3 hours prior to your bedtime or prior to taking a nap.  Eat small, frequent meals instead of large meals.  Wear loose-fitting clothing. Do not wear anything tight around your waist that causes pressure on your stomach.  Raise the head of your bed 6 to 8 inches with wood blocks to help you sleep. Extra pillows will not help.  Only take over-the-counter or prescription medicines for pain, discomfort, or fever as directed by your caregiver.  Do not take aspirin, ibuprofen, or other nonsteroidal anti-inflammatory drugs (NSAIDs). SEEK IMMEDIATE MEDICAL CARE IF:   You have pain in your arms, neck, jaw, teeth, or back.  Your pain increases or changes in intensity or duration.  You develop nausea, vomiting, or sweating (diaphoresis).  You develop shortness of breath, or you faint.  Your vomit is green, yellow, black, or looks like coffee grounds or blood.  Your stool is red, bloody, or black. These symptoms could be signs of other problems, such as heart disease, gastric bleeding, or esophageal bleeding. MAKE SURE YOU:   Understand these instructions.  Will watch your condition.  Will get help right away if you are not doing well or get worse. Document Released: 06/07/2005 Document Revised: 11/20/2011 Document Reviewed: 03/17/2011 [facility] Patient Information 2015 [place], [place]. This information is not intended to replace advice given to you by your health care provider. Make sure you discuss any questions you have with your health care provider.

## 2015-05-20 NOTE — Transfer of Care (Signed)
Immediate Anesthesia Transfer of Care Note  Patient: Diane [name]  Procedure(s) Performed: Procedure(s) with comments: ESOPHAGOGASTRODUODENOSCOPY (EGD) WITH PROPOFOL (N/A) MALONEY DILATION (N/A) - Maloney dilator # 54 COLONOSCOPY WITH PROPOFOL (N/A) - In cecum @ 10:23 - withdrawal time 9 minutes  Patient Location: PACU  Anesthesia Type:MAC  Level of Consciousness: awake  Airway & Oxygen Therapy: Patient Spontanous Breathing and Patient connected to nasal cannula oxygen  Post-op Assessment: Report given to RN  Post vital signs: Reviewed and stable  Last Vitals:  Filed Vitals:   05/20/15 0945  BP: 115/64  Pulse:   Temp:   Resp: 14    Complications: No apparent anesthesia complications

## 2015-05-20 NOTE — Anesthesia Postprocedure Evaluation (Signed)
  Anesthesia Post-op Note  Patient: Diane [name]  Procedure(s) Performed: Procedure(s) with comments: ESOPHAGOGASTRODUODENOSCOPY (EGD) WITH PROPOFOL (N/A) MALONEY DILATION (N/A) - Maloney dilator # 54 COLONOSCOPY WITH PROPOFOL (N/A) - In cecum @ 10:23 - withdrawal time 9 minutes  Patient Location: PACU  Anesthesia Type:MAC  Level of Consciousness: awake, alert  and oriented  Airway and Oxygen Therapy: Patient Spontanous Breathing  Post-op Pain: none  Post-op Assessment: Post-op Vital signs reviewed, Patient's Cardiovascular Status Stable, Respiratory Function Stable and Patent Airway              Post-op Vital Signs: Reviewed and stable  Last Vitals:  Filed Vitals:   05/20/15 0945  BP: 115/64  Pulse:   Temp:   Resp: 14    Complications: No apparent anesthesia complications

## 2015-05-20 NOTE — H&P (View-Only) (Signed)
Referring Provider: Elfredia Nevins, MD Primary Care Physician:  Cassell Smiles., MD Primary GI:  Dr. Jena Gauss  Chief Complaint  Patient presents with  . Follow-up    HPI:   64 year old female presents for follow-up on GERD, constipation, and possible need for colonoscopy. We did receive her records from [facility] for a colonoscopy on 06/28/2006 which found pancolonic diverticula, 5 mm polyp in the base of the cecum which was much destroyed with removal and not recovered, a second 5 mm polyp in the mid ascending colon that was removed and found to be adenomatous on pathology. Prep was deemed marginal and other lesions may not have been seen. Recommend continue Prilosec, diverticulosis literature. Per endoscopist suspect low-volume hematochezia due to hemorrhoids and a ten-day Anusol suppository courses prescribed. Given polypectomy 2, one of which was adenomatous, and marginal prep on her colonoscopy which was 9 years ago, she is likely due for surveillance.  Today she states her GERD symptoms are much improved on PPI. Denies abdominal pain, N/V. Still has bitter regurgitation once a week. Still had for her to swallow, having dysphagia which happens nearly every day. She tends to have issues with all foods. Will sometimes have regurgitation but will also sometimes pass with time. Denies hematochezia, melena. Initially stated she is still constipated on Linzess 145 mcg daily, but on further questioning she clarified that she took the samples provided and then returned to Amitiza (which previously wasn't working) in order to finish them. Is not currently taking Linzess. Denies chest pain, dyspnea, dizziness, lightheadedness, syncope, near syncope. Denies any other upper or lower GI symptoms.   Past Medical History  Diagnosis Date  . Hypertension   . Allergic rhinitis   . Anxiety   . Depression   . GERD (gastroesophageal reflux disease)   . High cholesterol   . Hypothyroid   .  Chronic back pain   . Arthritis   . Immature cataract   . Sleep apnea     STOP BANG score=4  . Seizures 20 yrs ago    unknown etiology-on meds  . Headaches, cluster 3 yrs ago    last one  . Mania   . 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. PTSD (post-traumatic stress disorder)     Past Surgical History  Procedure Laterality Date  . Left foot  x 2  . Foot surgery      left foot-  . Partial hysterectomy    . Oophoectomy  bilateral  . Cholecystectomy    . Lumbar disc surgery  L4-5  . Back surgery    . Knee arthroscopy  01/2012    right  . Total knee arthroplasty  07/01/2012    Procedure: TOTAL KNEE ARTHROPLASTY;  Surgeon: Vickki Hearing, MD;  Location: [facility];  Service: Orthopedics;  Laterality: Right;  . Total knee revision Right 01/03/2013    Procedure: Patellaplasty Right Knee;  Surgeon: Vickki Hearing, MD;  Location: [facility];  Service: Orthopedics;  Laterality: Right;  . Knee arthroscopy with lateral release Right 01/03/2013    Procedure: Lateral Release Patella Right Knee;  Surgeon: Vickki Hearing, MD;  Location: [facility];  Service: Orthopedics;  Laterality: Right;  . Incision and drainage Right 01/03/2013    Procedure: INCISION AND DRAINAGE;  Surgeon: Vickki Hearing, MD;  Location: [facility];  Service: Orthopedics;  Laterality: Right;  . Esophagogastroduodenoscopy  10/16/2008    WGN:FAOZH hiatal hernia otherwise normal esophagus, stomach  D1, D2, status post passage  of a 56-French Maloney dilator  . Colonoscopy  10/16/2008    GNF:AOZHYQMVHQI, otherwise normal rectum; pancolonic diverticula the remainder of the colonic mucosa appeared normal. Next TCS due 10/2013.  Marland Kitchen Esophagogastroduodenoscopy  06/28/2006    ONG:EXBMWU esophagus. Small hiatal hernia. Otherwise normal stomach, D1 and D2, status post passage of a 56 [place] Maloney dilator  . Colonoscopy  06/28/2006    RMR: Internal hemorrhoids. Diminutive rectal polyps, cold biopsied/ Pancolonic diverticula. Polyps in the right  colon removed   . Esophagogastroduodenoscopy (egd) with esophageal dilation N/A 07/14/2013    RMR: Abnormal esophagus of uncertain significance-status post esophageal biospy. small hiatal hernia.     Current Outpatient Prescriptions  Medication Sig Dispense Refill  . ALPRAZolam (XANAX) 0.5 MG tablet Take 1 tablet (0.5 mg total) by mouth 2 (two) times daily. For anxiety 60 tablet 2  . cyclobenzaprine (FLEXERIL) 10 MG tablet Take 1 tablet (10 mg total) by mouth 3 (three) times daily. For spasms 60 tablet 5  . divalproex (DEPAKOTE) 250 MG DR tablet Take 3 tablets (750 mg total) by mouth at bedtime. 90 tablet 2  . levothyroxine (LEVOTHROID) 150 MCG tablet Take 175 mcg by mouth daily.     Marland Kitchen lubiprostone (AMITIZA) 24 MCG capsule Take 24 mcg by mouth 2 (two) times daily with a meal.    . omeprazole (PRILOSEC) 20 MG capsule Take 1 capsule (20 mg total) by mouth daily. 30 capsule 3  . oxyCODONE-acetaminophen (PERCOCET) 7.5-325 MG per tablet Take 1 tablet by mouth every 6 (six) hours as needed. 120 tablet 0  . rosuvastatin (CRESTOR) 40 MG tablet Take 40 mg by mouth daily.      . traZODone (DESYREL) 100 MG tablet Take 1 tablet (100 mg total) by mouth at bedtime. 30 tablet 2  . Linaclotide (LINZESS) 145 MCG CAPS capsule Take 1 capsule (145 mcg total) by mouth daily. (Patient not taking: Reported on 05/05/2015) 14 capsule 0   No current facility-administered medications for this visit.    Allergies as of 05/05/2015 - Review Complete 05/05/2015  Allergen Reaction Noted  . Neomycin-bacitracin zn-polymyx Other (See Comments) 02/27/2008  . Penicillins Hives   . Sulfonamide derivatives Hives and Swelling 02/27/2008    Family History  Problem Relation Age of Onset  . Heart disease    . Diabetes    . Alcohol abuse    . Alcohol abuse Mother   . Anxiety disorder Mother   . Alcohol abuse Father   . Anxiety disorder Father   . Bipolar disorder Sister   . Alcohol abuse Brother   . Anxiety disorder  Brother   . Drug abuse Brother   . Dementia Paternal Aunt   . Depression Neg Hx   . OCD Neg Hx   . Paranoid behavior Neg Hx   . Schizophrenia Neg Hx   . Sexual abuse Neg Hx   . Physical abuse Neg Hx   . ADD / ADHD Grandchild   . ADD / ADHD Grandchild   . Drug abuse Brother   . Alcohol abuse Brother   . Anxiety disorder Brother   . Seizures Brother   . Alcohol abuse Brother   . Anxiety disorder Brother   . Seizures Brother   . Alcohol abuse Brother   . Anxiety disorder Brother   . Alcohol abuse Brother   . Anxiety disorder Brother   . Colon cancer Neg Hx     Social History   Social History  . Marital Status: Married  Spouse Name: N/A  . Number of Children: N/A  . Years of Education: 12th grade   Occupational History  . disabled    Social History Main Topics  . Smoking status: Former Smoker -- 0.25 packs/day for 5 years    Types: Cigarettes    Quit date: 06/26/2003  . Smokeless tobacco: Never Used  . Alcohol Use: Yes     Comment: occasionally  . Drug Use: No     Comment: has a past history of street drug use  . Sexual Activity: Yes    Birth Control/ Protection: Surgical   Other Topics Concern  . None   Social History Narrative    Review of Systems: General: Negative for anorexia, weight loss, fever, chills, fatigue, weakness. ENT: Negative for hoarseness. CV: Negative for chest pain, angina, palpitations, dyspnea on exertion, peripheral edema.  Respiratory: Negative for dyspnea at rest, dyspnea on exertion, cough, sputum, wheezing.  GI: See history of present illness. Endo: Negative for unusual weight change.  Heme: Negative for bruising or bleeding. Allergy: Negative for rash or hives.   Physical Exam: BP 135/81 mmHg  Pulse 80  Temp(Src) 97.3 F (36.3 C) (Oral)  Ht 5\' 3"  (1.6 m)  Wt 233 lb 12.8 oz (106.051 kg)  BMI 41.43 kg/m2 General:   Alert and oriented. Pleasant and cooperative. Well-nourished and well-developed.  Head:  Normocephalic and  atraumatic. Eyes:  Without icterus, sclera clear and conjunctiva pink.  Ears:  Normal auditory acuity. Cardiovascular:  S1, S2 present without murmurs appreciated. Normal pulses noted. Extremities without clubbing or edema. Respiratory:  Clear to auscultation bilaterally. No wheezes, rales, or rhonchi. No distress.  Gastrointestinal:  +BS, rounded, soft, and non-distended. Minimal LLQ TTP. No HSM noted. No guarding or rebound. No masses appreciated.  Rectal:  Deferred  Musculoskalatal:  Symmetrical without gross deformities. Normal posture. Skin:  Intact without significant lesions or rashes. Neurologic:  Alert and oriented x4;  grossly normal neurologically. Psych:  Alert and cooperative. Normal mood and affect. Heme/Lymph/Immune: No excessive bruising noted.    05/05/2015 9:29 AM

## 2015-05-20 NOTE — Op Note (Signed)
[facility] [address] [place], [address]   ENDOSCOPY PROCEDURE REPORT  PATIENT: Diane [name], Diane [name]  MR#: [id number] BIRTHDATE: [birth year] , 63  yrs. old GENDER: female ENDOSCOPIST: R.  Roetta Sessions, MD FACP FACG REFERRED BY:  Artis Delay, M.D. PROCEDURE DATE:  2015-06-06 PROCEDURE:  EGD with Elease Hashimoto dilation INDICATIONS:  Esophageal dysphagia. MEDICATIONS: Deep sedation per Dr.  [facility] ASA CLASS:      Class II  CONSENT: The risks, benefits, limitations, alternatives and imponderables have been discussed.  The potential for biopsy, esophogeal dilation, etc. have also been reviewed.  Questions have been answered.  All parties agreeable.  Please see the history and physical in the medical record for more information.  DESCRIPTION OF PROCEDURE: After the risks benefits and alternatives of the procedure were thoroughly explained, informed consent was obtained.  The    endoscope was introduced through the mouth and advanced to the second portion of the duodenum , limited by Without limitations. The instrument was slowly withdrawn as the mucosa was fully examined. Estimated blood loss is zero unless otherwise noted in this procedure report.    Normal-appearing, patent tubular esophagus.  Stomach. Normal-appearing gastric mucosa.  Patent pylorus.  Normal-appearing first and second portion of the duodenum.  Scope was withdrawn and a 54 [place] Maloney dilator was passed to full insertion easily.  A look back revealed slight amount of blood at the level of the UES.  No mucosal tear or evidence of complication found on look back.  Retroflexed views revealed no abnormalities.     The scope was then withdrawn from the patient and the procedure completed.  COMPLICATIONS: There were no immediate complications. EBL 2 mL ENDOSCOPIC IMPRESSION: Normal EGD?"status post Maloney dilation  RECOMMENDATIONS: Continue Prilosec 20 mg daily. Office visit with  [place] in 6 months. See colonoscopy report.  REPEAT EXAM:  eSigned:  R. Roetta Sessions, MD Jerrel Ivory [facility] Jun 06, 2015 10:40 AM    CC:  CPT CODES: ICD CODES:  The ICD and CPT codes recommended by this software are interpretations from the data that the clinical staff has captured with the software.  The verification of the translation of this report to the ICD and CPT codes and modifiers is the sole responsibility of the health care institution and practicing physician where this report was generated.  [employer] will not be held responsible for the validity of the ICD and CPT codes included on this report.  AMA assumes no liability for data contained or not contained herein. CPT is a Publishing rights manager of the [employer].  PATIENT NAME:  Diane [name], Diane [name] MR#: [id number]

## 2015-05-20 NOTE — Anesthesia Preprocedure Evaluation (Signed)
Anesthesia Evaluation  Patient identified by MRN, date of birth, ID band Patient awake    Reviewed: Allergy & Precautions, H&P , NPO status , Patient's Chart, lab work & pertinent test results  Airway Mallampati: II  TM Distance: >3 FB Neck ROM: Full    Dental  (+) Edentulous Upper   Pulmonary sleep apnea , former smoker,    Pulmonary exam normal        Cardiovascular hypertension, Pt. on medications  Rhythm:Regular Rate:Normal     Neuro/Psych  Headaches, Seizures -, Well Controlled,  PSYCHIATRIC DISORDERS Anxiety Depression Bipolar Disorder    GI/Hepatic GERD  Medicated,  Endo/Other  Hypothyroidism   Renal/GU      Musculoskeletal  (+) Arthritis ,   Abdominal (+) + obese,  Abdomen: soft.    Peds  Hematology negative hematology ROS (+)   Anesthesia Other Findings   Reproductive/Obstetrics negative OB ROS                             Anesthesia Physical Anesthesia Plan  ASA: III  Anesthesia Plan: MAC   Post-op Pain Management:    Induction: Intravenous  Airway Management Planned: Simple Face Mask  Additional Equipment:   Intra-op Plan:   Post-operative Plan:   Informed Consent: I have reviewed the patients History and Physical, chart, labs and discussed the procedure including the risks, benefits and alternatives for the proposed anesthesia with the patient or authorized representative who has indicated his/her understanding and acceptance.     Plan Discussed with:   Anesthesia Plan Comments:         Anesthesia Quick Evaluation

## 2015-05-21 ENCOUNTER — Encounter (HOSPITAL_COMMUNITY): Payer: Self-pay | Admitting: Internal Medicine

## 2015-05-24 ENCOUNTER — Other Ambulatory Visit: Payer: Self-pay | Admitting: *Deleted

## 2015-05-24 ENCOUNTER — Telehealth: Payer: Self-pay | Admitting: Orthopedic Surgery

## 2015-05-24 DIAGNOSIS — M1711 Unilateral primary osteoarthritis, right knee: Secondary | ICD-10-CM

## 2015-05-24 MED ORDER — OXYCODONE-ACETAMINOPHEN 7.5-325 MG PO TABS: 1.0000 | ORAL_TABLET | Freq: Four times a day (QID) | ORAL | Status: DC | PRN

## 2015-05-24 NOTE — Telephone Encounter (Signed)
Patient picked up Rx

## 2015-05-24 NOTE — Telephone Encounter (Signed)
Patient is calling requesting a medication refill on oxyCODONE-acetaminophen (PERCOCET) 7.5-325 MG per tablet please advise?

## 2015-05-24 NOTE — Telephone Encounter (Signed)
Prescription available, patient aware  

## 2015-06-21 ENCOUNTER — Telehealth: Payer: Self-pay | Admitting: Orthopedic Surgery

## 2015-06-21 NOTE — Telephone Encounter (Signed)
Patient is calling requesting a refill on medication oxyCODONE-acetaminophen (PERCOCET) 7.5-325 MG per tablet  Please advise? 

## 2015-06-22 ENCOUNTER — Other Ambulatory Visit: Payer: Self-pay | Admitting: *Deleted

## 2015-06-22 DIAGNOSIS — M1711 Unilateral primary osteoarthritis, right knee: Secondary | ICD-10-CM

## 2015-06-22 MED ORDER — OXYCODONE-ACETAMINOPHEN 7.5-325 MG PO TABS: 1.0000 | ORAL_TABLET | Freq: Four times a day (QID) | ORAL | Status: DC | PRN

## 2015-06-22 NOTE — Telephone Encounter (Signed)
Patient picked up Rx

## 2015-06-22 NOTE — Telephone Encounter (Signed)
Called patient; notified prescription is available for pick-up

## 2015-06-25 ENCOUNTER — Other Ambulatory Visit (HOSPITAL_COMMUNITY): Payer: Self-pay | Admitting: Nephrology

## 2015-06-25 DIAGNOSIS — N183 Chronic kidney disease, stage 3 unspecified: Secondary | ICD-10-CM

## 2015-06-28 ENCOUNTER — Other Ambulatory Visit (HOSPITAL_COMMUNITY): Payer: Self-pay | Admitting: Radiology

## 2015-06-28 DIAGNOSIS — G4730 Sleep apnea, unspecified: Secondary | ICD-10-CM

## 2015-06-30 ENCOUNTER — Encounter (HOSPITAL_COMMUNITY): Payer: Self-pay | Admitting: Psychiatry

## 2015-06-30 ENCOUNTER — Ambulatory Visit (HOSPITAL_COMMUNITY): Payer: Self-pay | Admitting: Psychiatry

## 2015-06-30 ENCOUNTER — Ambulatory Visit (INDEPENDENT_AMBULATORY_CARE_PROVIDER_SITE_OTHER): Payer: Commercial Managed Care - HMO | Admitting: Psychiatry

## 2015-06-30 VITALS — BP 144/73 | HR 72 | Ht 63.0 in | Wt 233.4 lb

## 2015-06-30 DIAGNOSIS — F1310 Sedative, hypnotic or anxiolytic abuse, uncomplicated: Secondary | ICD-10-CM

## 2015-06-30 DIAGNOSIS — F1010 Alcohol abuse, uncomplicated: Secondary | ICD-10-CM | POA: Diagnosis not present

## 2015-06-30 DIAGNOSIS — F3130 Bipolar disorder, current episode depressed, mild or moderate severity, unspecified: Secondary | ICD-10-CM

## 2015-06-30 DIAGNOSIS — F5105 Insomnia due to other mental disorder: Secondary | ICD-10-CM

## 2015-06-30 MED ORDER — ALPRAZOLAM 0.5 MG PO TABS: ORAL_TABLET | ORAL | Status: DC

## 2015-06-30 MED ORDER — TRAZODONE HCL 100 MG PO TABS: 100.0000 mg | ORAL_TABLET | Freq: Every day | ORAL | Status: DC

## 2015-06-30 MED ORDER — DIVALPROEX SODIUM 250 MG PO DR TAB: 750.0000 mg | DELAYED_RELEASE_TABLET | Freq: Every day | ORAL | Status: DC

## 2015-06-30 NOTE — Progress Notes (Signed)
Patient ID: Diane [name], female   DOB: 1951/02/26, 64 y.o.   MRN: [id number] Patient ID: Diane [name], female   DOB: March 08, 1951, 64 y.o.   MRN: [id number] Patient ID: Diane [name], female   DOB: November 15, 1950, 64 y.o.   MRN: [id number] Patient ID: Diane [name], female   DOB: Aug 27, 1951, 64 y.o.   MRN: [id number] Patient ID: Diane [name], female   DOB: June 18, 1951, 64 y.o.   MRN: [id number] Patient ID: Diane [name], female   DOB: Apr 15, 1951, 64 y.o.   MRN: [id number] Patient ID: Diane [name], female   DOB: [birth year], 64 y.o.   MRN: [id number] Patient ID: Diane [name], female   DOB: November 03, 1950, 64 y.o.   MRN: [id number] [facility] [id number] Progress Note Diane [name] MRN: [id number] DOB: [birth year] Age: 64 y.o.  Date: 06/30/2015  Chief Complaint  Patient presents with  . Depression  . Anxiety  . Follow-up    History of present illness Patient is 64 year old [place] American female who came for her followup appointment. She is currently with her husband in [place] and is on disability  The patient was last seen in March. At that time she told me she was moving to [place] to be with her daughter. She didn't like living down there and she and her grandson have come back. Her grandson got his own place to live and she is now staying in a motel temporarily. Her mood is "up and down" because she is unsure where she is going to live. She's in a lot of pain in her knee and would like to get back on the amitriptyline since she ran out. A nurse practitioner and primary care put her on Celexa without realizing that she's already on Cymbalta. I explained to her that made no sense to be on both. She does admit that she still drinks periodically sometimes too much on weekends. I warned her against this. She denies being severely depressed or suicidal  The patient returns after 2 months. She is living with her husband, her grandson and his girlfriend. She had moved in with her husband to  help him get through his throat cancer treatment. He is now through with treatment and he is back to his old ways-drinking smoking cigarettes and marijuana and partying with his friends. She is getting tired of it and thinking of moving out. She's not sleeping well due to knee pain and also because she is on Lasix and has to get up to urinate several times during the night. The trazodone hasn't helped much but it works better with 2 Xanax at bedtime so I can increase his Xanax a little bit. She staying away from drinking except for 2 weeks ago on her birthday  Current psychotropic medication Xanax 0.5 mg twice a day Trazodone 100 mg daily at bedtime Depakote 750 mg daily at bedtime  Past psychiatric history Patient has history of one psychiatric admission in [facility]. She took overdose on her pills she was also intoxicated with alcohol. Patient has also seen by psychiatrist in 2004. At that time she was seeking more benzodiazepines and pain medication.  Vitals: BP 144/73 mmHg  Pulse 72  Ht  (1.6 m)  Wt 233 lb 6.4 oz (105.87 kg)  BMI 41.36 kg/m2  SpO2 93%  Allergies: Allergies  Allergen Reactions  . Neomycin-Bacitracin Zn-Polymyx Other (See Comments)    Reaction: skin starts to become raw and peels off  .  Penicillins Hives  . Sulfonamide Derivatives Hives and Swelling   Medical History: Past Medical History  Diagnosis Date  . Hypertension   . Allergic rhinitis   . Anxiety   . Depression   . GERD (gastroesophageal reflux disease)   . High cholesterol   . Hypothyroid   . Chronic back pain   . Arthritis   . Immature cataract   . Sleep apnea     STOP BANG score=4  . Seizures (HCC) 20 yrs ago    unknown etiology-on meds  . Headaches, cluster 3 yrs ago    last one  . Mania (HCC)   . 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. PTSD (post-traumatic stress disorder)   Patient has history of hypertension, allergic rhinitis, hypothyroidism, chronic back pain,  hyperlipidemia and obesity.  She most recently had surgery on her right knee.  Surgical History: Past Surgical History  Procedure Laterality Date  . Left foot  x 2  . Foot surgery      left foot-  . Partial hysterectomy    . Oophoectomy  bilateral  . Cholecystectomy    . Lumbar disc surgery  L4-5  . Back surgery    . Knee arthroscopy  01/2012    right  . Total knee arthroplasty  07/01/2012    Procedure: TOTAL KNEE ARTHROPLASTY;  Surgeon: Vickki HearingStanley E Harrison, MD;  Location: [facility];  Service: Orthopedics;  Laterality: Right;  . Total knee revision Right 01/03/2013    Procedure: Patellaplasty Right Knee;  Surgeon: Vickki HearingStanley E Harrison, MD;  Location: [facility];  Service: Orthopedics;  Laterality: Right;  . Knee arthroscopy with lateral release Right 01/03/2013    Procedure: Lateral Release Patella Right Knee;  Surgeon: Vickki HearingStanley E Harrison, MD;  Location: [facility];  Service: Orthopedics;  Laterality: Right;  . Incision and drainage Right 01/03/2013    Procedure: INCISION AND DRAINAGE;  Surgeon: Vickki HearingStanley E Harrison, MD;  Location: [facility];  Service: Orthopedics;  Laterality: Right;  . Esophagogastroduodenoscopy  10/16/2008    ZOX:WRUEARMR:Small hiatal hernia otherwise normal esophagus, stomach  D1, D2, status post passage of a 56-French Maloney dilator  . Colonoscopy  10/16/2008    VWU:[id number]:Hemorrhoids, otherwise normal rectum; pancolonic diverticula the remainder of the colonic mucosa appeared normal. Next TCS due 10/2013.  Marland Kitchen. Esophagogastroduodenoscopy  06/28/2006    FAO:ZHYQMVRMR:Normal esophagus. Small hiatal hernia. Otherwise normal stomach, D1 and D2, status post passage of a 56 [place] Maloney dilator  . Colonoscopy  06/28/2006    RMR: Internal hemorrhoids. Diminutive rectal polyps, cold biopsied/ Pancolonic diverticula. Polyps in the right colon removed   . Esophagogastroduodenoscopy (egd) with esophageal dilation N/A 07/14/2013    RMR: Abnormal esophagus of uncertain significance-status post esophageal biospy. small hiatal  hernia.   . Esophagogastroduodenoscopy (egd) with propofol N/A 05/20/2015    Procedure: ESOPHAGOGASTRODUODENOSCOPY (EGD) WITH PROPOFOL;  Surgeon: Corbin Adeobert M Rourk, MD;  Location: [facility];  Service: Endoscopy;  Laterality: N/A;  Elease Hashimoto. Maloney dilation N/A 05/20/2015    Procedure: Elease HashimotoMALONEY DILATION;  Surgeon: Corbin Adeobert M Rourk, MD;  Location: [facility];  Service: Endoscopy;  Laterality: N/A;  Maloney dilator # 54  . Colonoscopy with propofol N/A 05/20/2015    Procedure: COLONOSCOPY WITH PROPOFOL;  Surgeon: Corbin Adeobert M Rourk, MD;  Location: [facility];  Service: Endoscopy;  Laterality: N/A;  In cecum @ 10:23 - withdrawal time 9 minutes   Family History: family history includes ADD / ADHD in her grandchild and grandchild; Alcohol abuse in her brother, brother, brother, brother, brother, father,  mother, and another family member; Anxiety disorder in her brother, brother, brother, brother, brother, father, and mother; Bipolar disorder in her sister; Dementia in her paternal aunt; Diabetes in an other family member; Drug abuse in her brother and brother; Heart disease in an other family member; Seizures in her brother and brother. There is no history of Depression, OCD, Paranoid behavior, Schizophrenia, Sexual abuse, Physical abuse, or Colon cancer.   Psychosocial history Patient has been married for 16 years. She has 2 children. She is married however her relationship is very tense. She has history of leaving her husband in the past.  Currently she lives by herself.  Mental status examination Patient is mildly obese female who is casually dressed and fairly groomed.  .  She is cooperative and maintained fair eye contact her mood is fairly good but she appears tired and her affect is subdued She denies suicidal ideation s. Her speech is clear and coherent. Her attention and concentration is fair. . She's alert and oriented x3. There are no psychotic symptoms present at this time. She denies any auditory or visual hallucination. Her  insight judgment is fair and her impulse control is okay.  Lab Results:  Results for orders placed or performed during the hospital encounter of 05/19/15 (from the past 8736 hour(s))  Surgical pcr screen   Collection Time: 05/19/15  2:15 PM  Result Value Ref Range   MRSA, PCR NEGATIVE NEGATIVE   Staphylococcus aureus NEGATIVE NEGATIVE  CBC with Differential/Platelet   Collection Time: 05/19/15  2:15 PM  Result Value Ref Range   WBC 6.7 4.0 - 10.5 K/uL   RBC 4.67 3.87 - 5.11 MIL/uL   Hemoglobin 12.7 12.0 - 15.0 g/dL   HCT 96.0 45.4 - 09.8 %   MCV 83.9 78.0 - 100.0 fL   MCH 27.2 26.0 - 34.0 pg   MCHC 32.4 30.0 - 36.0 g/dL   RDW 11.9 14.7 - 82.9 %   Platelets 183 150 - 400 K/uL   Neutrophils Relative % 41 (L) 43 - 77 %   Neutro Abs 2.8 1.7 - 7.7 K/uL   Lymphocytes Relative 48 (H) 12 - 46 %   Lymphs Abs 3.2 0.7 - 4.0 K/uL   Monocytes Relative 10 3 - 12 %   Monocytes Absolute 0.7 0.1 - 1.0 K/uL   Eosinophils Relative 1 0 - 5 %   Eosinophils Absolute 0.0 0.0 - 0.7 K/uL   Basophils Relative 0 0 - 1 %   Basophils Absolute 0.0 0.0 - 0.1 K/uL  Basic metabolic panel   Collection Time: 05/19/15  2:15 PM  Result Value Ref Range   Sodium 142 135 - 145 mmol/L   Potassium 3.6 3.5 - 5.1 mmol/L   Chloride 105 101 - 111 mmol/L   CO2 30 22 - 32 mmol/L   Glucose, Bld 82 65 - 99 mg/dL   BUN 18 6 - 20 mg/dL   Creatinine, Ser 5.62 0.44 - 1.00 mg/dL   Calcium 8.7 (L) 8.9 - 10.3 mg/dL   GFR calc non Af Amer >60 >60 mL/min   GFR calc Af Amer >60 >60 mL/min   Anion gap 7 5 - 15  Results for orders placed or performed in visit on 07/10/14 (from the past 8736 hour(s))  Valproic Acid level   Collection Time: 07/10/14  1:49 PM  Result Value Ref Range   Valproic Acid Lvl 24.0 (L) 50.0 - 100.0 ug/mL  Hepatic function panel   Collection Time: 07/10/14  1:49  PM  Result Value Ref Range   Total Bilirubin 0.3 0.2 - 1.2 mg/dL   Bilirubin, Direct 0.1 0.0 - 0.3 mg/dL   Indirect Bilirubin 0.2 0.2 - 1.2  mg/dL   Alkaline Phosphatase 60 39 - 117 U/L   AST 15 0 - 37 U/L   ALT 10 0 - 35 U/L   Total Protein 6.8 6.0 - 8.3 g/dL   Albumin 3.6 3.5 - 5.2 g/dL   Assessment Axis I bipolar disorder NOS, benzodiazepine abuse, alcohol abuse Axis II deferred Axis III see medical history Axis IV moderate Axis V 60-65  Plan/Discussion: She'll continue trazodone 100 mg daily at bedtime, continue Xanax for anxiety but increase the dose to 0.5 mg in the morning and 1 mg at bedtime and Depakote for mood disorder She'll return to see me in 2 months Time spent 15 minutes.  More than 50% of the time spent in psychoeducation, counseling and coordination of care.  Discuss safety plan that anytime having active suicidal thoughts or homicidal thoughts then patient need to call 911 or go to the local emergency room.  MEDICATIONS this encounter: Meds ordered this encounter  Medications  . furosemide (LASIX) 20 MG tablet    Sig: Take 20 mg by mouth daily.  . divalproex (DEPAKOTE) 250 MG DR tablet    Sig: Take 3 tablets (750 mg total) by mouth at bedtime.    Dispense:  90 tablet    Refill:  2  . traZODone (DESYREL) 100 MG tablet    Sig: Take 1 tablet (100 mg total) by mouth at bedtime.    Dispense:  30 tablet    Refill:  2  . ALPRAZolam (XANAX) 0.5 MG tablet    Sig: Take one in the am and two at bedtime    Dispense:  90 tablet    Refill:  2    Medical Decision Making Problem Points:  Established problem, stable/improving (1), Established problem, worsening (2), New problem, with no additional work-up planned (3), Review of last therapy session (1) and Review of psycho-social stressors (1) Data Points:  Review or order clinical lab tests (1) Review and summation of old records (2) Review of medication regiment & side effects (2) Review of new medications or change in dosage (2)  Sedric Guia, MD

## 2015-07-07 ENCOUNTER — Ambulatory Visit (HOSPITAL_COMMUNITY): Admission: RE | Admit: 2015-07-07 | Payer: Commercial Managed Care - HMO | Source: Ambulatory Visit

## 2015-07-08 ENCOUNTER — Ambulatory Visit (HOSPITAL_COMMUNITY)
Admission: RE | Admit: 2015-07-08 | Discharge: 2015-07-08 | Disposition: A | Discharge status: Home / Self Care | Payer: Commercial Managed Care - HMO | Source: Ambulatory Visit | Attending: Nephrology | Admitting: Nephrology

## 2015-07-08 DIAGNOSIS — I129 Hypertensive chronic kidney disease with stage 1 through stage 4 chronic kidney disease, or unspecified chronic kidney disease: Secondary | ICD-10-CM | POA: Diagnosis not present

## 2015-07-08 DIAGNOSIS — N183 Chronic kidney disease, stage 3 unspecified: Secondary | ICD-10-CM

## 2015-07-08 DIAGNOSIS — E785 Hyperlipidemia, unspecified: Secondary | ICD-10-CM | POA: Diagnosis not present

## 2015-07-08 DIAGNOSIS — Z87891 Personal history of nicotine dependence: Secondary | ICD-10-CM | POA: Insufficient documentation

## 2015-07-13 ENCOUNTER — Ambulatory Visit (INDEPENDENT_AMBULATORY_CARE_PROVIDER_SITE_OTHER): Payer: Commercial Managed Care - HMO | Admitting: Orthopedic Surgery

## 2015-07-13 ENCOUNTER — Ambulatory Visit (INDEPENDENT_AMBULATORY_CARE_PROVIDER_SITE_OTHER): Payer: Commercial Managed Care - HMO

## 2015-07-13 VITALS — BP 150/82 | Ht 63.0 in | Wt 233.0 lb

## 2015-07-13 DIAGNOSIS — M129 Arthropathy, unspecified: Secondary | ICD-10-CM

## 2015-07-13 DIAGNOSIS — M1711 Unilateral primary osteoarthritis, right knee: Secondary | ICD-10-CM

## 2015-07-13 DIAGNOSIS — M1710 Unilateral primary osteoarthritis, unspecified knee: Secondary | ICD-10-CM

## 2015-07-13 MED ORDER — OXYCODONE-ACETAMINOPHEN 7.5-325 MG PO TABS: 1.0000 | ORAL_TABLET | Freq: Four times a day (QID) | ORAL | Status: DC | PRN

## 2015-07-14 NOTE — Progress Notes (Signed)
Patient ID: Diane [name], female   DOB: 06/19/1951, 64 y.o.   MRN: [id number]  Post op annual TKA   Chief Complaint  Patient presents with  . Follow-up    Yearly recheck on right knee replacement, DOS 07-01-12.    HPI Diane [name] is a 64 y.o. female.   The patient still has some her referred pain to her right knee from her lower back condition. She says her right knee feels just like it did before her surgery. She comes in for annual follow-up with persistent right leg discomfort and a new complaint of left knee pain. We discussed possibly left total knee she wants to think about that  She is on Percocet 7.5 mg requests a refill.    Past Medical History  Diagnosis Date  . Hypertension   . Allergic rhinitis   . Anxiety   . Depression   . GERD (gastroesophageal reflux disease)   . High cholesterol   . Hypothyroid   . Chronic back pain   . Arthritis   . Immature cataract   . Sleep apnea     STOP BANG score=4  . Seizures (HCC) 20 yrs ago    unknown etiology-on meds  . Headaches, cluster 3 yrs ago    last one  . Mania (HCC)   . 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. PTSD (post-traumatic stress disorder)     Past Surgical History  Procedure Laterality Date  . Left foot  x 2  . Foot surgery      left foot-  . Partial hysterectomy    . Oophoectomy  bilateral  . Cholecystectomy    . Lumbar disc surgery  L4-5  . Back surgery    . Knee arthroscopy  01/2012    right  . Total knee arthroplasty  07/01/2012    Procedure: TOTAL KNEE ARTHROPLASTY;  Surgeon: Vickki Hearing, MD;  Location: [facility];  Service: Orthopedics;  Laterality: Right;  . Total knee revision Right 01/03/2013    Procedure: Patellaplasty Right Knee;  Surgeon: Vickki Hearing, MD;  Location: [facility];  Service: Orthopedics;  Laterality: Right;  . Knee arthroscopy with lateral release Right 01/03/2013    Procedure: Lateral Release Patella Right Knee;  Surgeon: Vickki Hearing, MD;  Location: [facility];  Service:  Orthopedics;  Laterality: Right;  . Incision and drainage Right 01/03/2013    Procedure: INCISION AND DRAINAGE;  Surgeon: Vickki Hearing, MD;  Location: [facility];  Service: Orthopedics;  Laterality: Right;  . Esophagogastroduodenoscopy  10/16/2008    WUJ:WJXBJ hiatal hernia otherwise normal esophagus, stomach  D1, D2, status post passage of a 56-French Maloney dilator  . Colonoscopy  10/16/2008    YNW:[id number], otherwise normal rectum; pancolonic diverticula the remainder of the colonic mucosa appeared normal. Next TCS due 10/2013.  Marland Kitchen Esophagogastroduodenoscopy  06/28/2006    HQI:ONGEXB esophagus. Small hiatal hernia. Otherwise normal stomach, D1 and D2, status post passage of a 56 [place] Maloney dilator  . Colonoscopy  06/28/2006    RMR: Internal hemorrhoids. Diminutive rectal polyps, cold biopsied/ Pancolonic diverticula. Polyps in the right colon removed   . Esophagogastroduodenoscopy (egd) with esophageal dilation N/A 07/14/2013    RMR: Abnormal esophagus of uncertain significance-status post esophageal biospy. small hiatal hernia.   . Esophagogastroduodenoscopy (egd) with propofol N/A 05/20/2015    Procedure: ESOPHAGOGASTRODUODENOSCOPY (EGD) WITH PROPOFOL;  Surgeon: Corbin Ade, MD;  Location: [facility];  Service: Endoscopy;  Laterality: N/A;  Elease Hashimoto dilation N/A 05/20/2015  Procedure: MALONEY DILATION;  Surgeon: Corbin Adeobert M Rourk, MD;  Location: [facility];  Service: Endoscopy;  Laterality: N/A;  Maloney dilator # 54  . Colonoscopy with propofol N/A 05/20/2015    Procedure: COLONOSCOPY WITH PROPOFOL;  Surgeon: Corbin Adeobert M Rourk, MD;  Location: [facility];  Service: Endoscopy;  Laterality: N/A;  In cecum @ 10:23 - withdrawal time 9 minutes     Allergies  Allergen Reactions  . Neomycin-Bacitracin Zn-Polymyx Other (See Comments)    Reaction: skin starts to become raw and peels off  . Penicillins Hives  . Sulfonamide Derivatives Hives and Swelling    Current Outpatient Prescriptions  Medication  Sig Dispense Refill  . ALPRAZolam (XANAX) 0.5 MG tablet Take one in the am and two at bedtime 90 tablet 2  . amLODipine (NORVASC) 5 MG tablet Take 5 mg by mouth daily.    . cyclobenzaprine (FLEXERIL) 10 MG tablet Take 1 tablet (10 mg total) by mouth 3 (three) times daily. For spasms 60 tablet 5  . divalproex (DEPAKOTE) 250 MG DR tablet Take 3 tablets (750 mg total) by mouth at bedtime. 90 tablet 2  . furosemide (LASIX) 20 MG tablet Take 20 mg by mouth daily.    Marland Kitchen. levothyroxine (LEVOTHROID) 150 MCG tablet Take 175 mcg by mouth daily.     . Linaclotide (LINZESS) 145 MCG CAPS capsule Take 1 capsule (145 mcg total) by mouth daily. 30 capsule 1  . omeprazole (PRILOSEC) 20 MG capsule Take 1 capsule (20 mg total) by mouth 2 (two) times daily before a meal. 30 capsule 3  . oxybutynin (DITROPAN-XL) 5 MG 24 hr tablet Take 5 mg by mouth daily.  0  . oxyCODONE-acetaminophen (PERCOCET) 7.5-325 MG tablet Take 1 tablet by mouth every 6 (six) hours as needed. 120 tablet 0  . polyethylene glycol-electrolytes (NULYTELY/GOLYTELY) 420 G solution Take 4,000 mLs by mouth once. 4000 mL 0  . rosuvastatin (CRESTOR) 40 MG tablet Take 40 mg by mouth daily.      . traZODone (DESYREL) 100 MG tablet Take 1 tablet (100 mg total) by mouth at bedtime. 30 tablet 2   No current facility-administered medications for this visit.    Review of Systems Review of Systems  Neurological:       Radicular pain right leg     Physical Exam Blood pressure 150/82, height 5\' 3"  (1.6 m), weight 233 lb (105.688 kg).   Gen. appearance is normal there are no congenital abnormalities   The patient is oriented 3   Mood and affect are normal   Ambulation is without assistive device   Knee inspection reveals a well-healed incision with no swelling   Knee flexion 105  Stability in the anteroposterior plane is normal as well as in the medial lateral plane  Motor exam reveals full extension without extensor lag   Data  Reviewed KNEE XRAYS : stable prosthesis right knee   Assessment S/P TKA   Plan    1 year f/u  Call if interested in left tka - needs xrays and re exam         Fuller CanadaStanley Rodolph Hagemann 07/14/2015, 10:17 PM

## 2015-07-19 ENCOUNTER — Other Ambulatory Visit: Payer: Self-pay | Admitting: Nurse Practitioner

## 2015-07-21 ENCOUNTER — Other Ambulatory Visit: Payer: Self-pay | Admitting: *Deleted

## 2015-07-21 ENCOUNTER — Telehealth: Payer: Self-pay | Admitting: Orthopedic Surgery

## 2015-07-21 MED ORDER — CLINDAMYCIN HCL 300 MG PO CAPS: ORAL_CAPSULE | ORAL | Status: DC

## 2015-07-21 NOTE — Telephone Encounter (Signed)
[facility], ph# [phone]; per Darl PikesSusan, states patient is in their office at this time; due to history of total knee replacement, question regarding pre-medication; asking for nurse to fax note indicating that pre-anti-biotics are recommended, and which medication Dr Romeo AppleHarrison prescribes - fax # [phone].

## 2015-07-21 NOTE — Telephone Encounter (Signed)
Order faxed.

## 2015-07-28 ENCOUNTER — Other Ambulatory Visit: Payer: Self-pay | Admitting: *Deleted

## 2015-07-28 ENCOUNTER — Telehealth: Payer: Self-pay | Admitting: *Deleted

## 2015-07-28 MED ORDER — CLINDAMYCIN HCL 300 MG PO CAPS: ORAL_CAPSULE | ORAL | Status: DC

## 2015-07-28 NOTE — Telephone Encounter (Signed)
Patient called stating she is having procedure done tomorrow at her dentist office with Dr. Jasmine AweInsook Smith [phone], Dr. Katrinka BlazingSmith advise patient to get a antibiotic, patient was last seen 07/13/15. Patient uses [facility]. Please advise Patient

## 2015-07-28 NOTE — Telephone Encounter (Signed)
Clindamycin sent to pharmacy as per protocol  Patient aware

## 2015-07-29 NOTE — Telephone Encounter (Signed)
Ok sorry

## 2015-07-29 NOTE — Telephone Encounter (Signed)
This was done automatically and only sent to you as an FYI  Keflex was flagged as an allergy so Clindamycin was sent.Marland Kitchen..Marland Kitchen

## 2015-07-29 NOTE — Telephone Encounter (Signed)
THIS SHOULD BE AUTOMATIC BY NOW  1 ST CHOICE ANTIBIOTIC FOR TOTAL JOINTS IS KEFLEX 500MG  4 TABS 1 HOUR BEFORE DENTAL PROCEDURE   2ND CHOICE CLINDAMYCIN   PLEASE WRITE THIS DOWN

## 2015-08-09 ENCOUNTER — Encounter: Payer: Self-pay | Admitting: Nurse Practitioner

## 2015-08-09 ENCOUNTER — Ambulatory Visit (INDEPENDENT_AMBULATORY_CARE_PROVIDER_SITE_OTHER): Payer: Commercial Managed Care - HMO | Admitting: Nurse Practitioner

## 2015-08-09 VITALS — BP 126/72 | HR 84 | Temp 97.4°F | Ht 63.0 in | Wt 233.6 lb

## 2015-08-09 DIAGNOSIS — R1033 Periumbilical pain: Secondary | ICD-10-CM | POA: Diagnosis not present

## 2015-08-09 DIAGNOSIS — K5900 Constipation, unspecified: Secondary | ICD-10-CM | POA: Diagnosis not present

## 2015-08-09 DIAGNOSIS — K219 Gastro-esophageal reflux disease without esophagitis: Secondary | ICD-10-CM

## 2015-08-09 DIAGNOSIS — R1319 Other dysphagia: Secondary | ICD-10-CM

## 2015-08-09 DIAGNOSIS — R109 Unspecified abdominal pain: Secondary | ICD-10-CM | POA: Insufficient documentation

## 2015-08-09 DIAGNOSIS — R1310 Dysphagia, unspecified: Secondary | ICD-10-CM

## 2015-08-09 DIAGNOSIS — R1314 Dysphagia, pharyngoesophageal phase: Secondary | ICD-10-CM

## 2015-08-09 NOTE — Assessment & Plan Note (Signed)
Patient with new "cramping" abdominal pain described as periumbilical, occurs at random, moderate to severe at times, typically self resolves anywhere from a couple minutes to a couple hours. Her constipation is much better controlled on Linzess. Today we'll check CBC, CMP, lipase and have her return in 4 weeks for further evaluation. May need to consider abdominal CT for further evaluation. Continue taking Linzess and Prilosec.

## 2015-08-09 NOTE — Assessment & Plan Note (Signed)
Constipation symptoms much improved with switch from Amitiza to Linzess. Has one to 2 bowel movements a day which are soft and do not require straining. She said is made oral difference. Continue taking Linzess as ordered. Return for follow-up as needed for any recurrent symptoms.

## 2015-08-09 NOTE — Progress Notes (Signed)
Referring Provider: Elfredia Nevins, MD Primary Care Physician:  Cassell Smiles., MD Primary GI:  Dr. Jena Gauss  Chief Complaint  Patient presents with  . Gastroesophageal Reflux  . Constipation    HPI:    64 year old female presents for follow-up on GERD, constipation, dysphagia, and post-EGD/TCS. Endoscopy completed 05/20/2015 found normal EGD status post [facility] dilation with a 54 French Maloney dilator. Colonoscopy completed same day found pancolonic diverticulosis, redundant colon. Recommend repeat colonoscopy in 5 years. Ulcer recommend continue Prilosec 20 mg daily.  Today she states her dysphagia is very much improved. GERD is doing ok, occasional breakthrough symptoms dependant on dietary intake. She is aware of her triggers. Constipation is doing "really good." Has a bowel movement daily on Linzess daily. Admits occasional abdominal pain described as cramping which lasts anywhere from a couple minutes to a couple hours. Nothing she can think of makes it better or worse, tends to self-resolve. Denies hematochezia and melena. Denies chest pain, dyspnea, dizziness, lightheadedness, syncope, near syncope. Denies any other upper or lower GI symptoms.  Past Medical History  Diagnosis Date  . Hypertension   . Allergic rhinitis   . Anxiety   . Depression   . GERD (gastroesophageal reflux disease)   . High cholesterol   . Hypothyroid   . Chronic back pain   . Arthritis   . Immature cataract   . Sleep apnea     STOP BANG score=4  . Seizures (HCC) 20 yrs ago    unknown etiology-on meds  . Headaches, cluster 3 yrs ago    last one  . Mania (HCC)   . 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. PTSD (post-traumatic stress disorder)     Past Surgical History  Procedure Laterality Date  . Left foot  x 2  . Foot surgery      left foot-  . Partial hysterectomy    . Oophoectomy  bilateral  . Cholecystectomy    . Lumbar disc surgery  L4-5  . Back surgery    . Knee arthroscopy  01/2012    right  . Total knee arthroplasty  07/01/2012    Procedure: TOTAL KNEE ARTHROPLASTY;  Surgeon: Vickki Hearing, MD;  Location: [facility];  Service: Orthopedics;  Laterality: Right;  . Total knee revision Right 01/03/2013    Procedure: Patellaplasty Right Knee;  Surgeon: Vickki Hearing, MD;  Location: [facility];  Service: Orthopedics;  Laterality: Right;  . Knee arthroscopy with lateral release Right 01/03/2013    Procedure: Lateral Release Patella Right Knee;  Surgeon: Vickki Hearing, MD;  Location: [facility];  Service: Orthopedics;  Laterality: Right;  . Incision and drainage Right 01/03/2013    Procedure: INCISION AND DRAINAGE;  Surgeon: Vickki Hearing, MD;  Location: [facility];  Service: Orthopedics;  Laterality: Right;  . Esophagogastroduodenoscopy  10/16/2008    ZOX:[id number] hiatal hernia otherwise normal esophagus, stomach  D1, D2, status post passage of a 56-French Maloney dilator  . Colonoscopy  10/16/2008    [id number], otherwise normal rectum; pancolonic diverticula the remainder of the colonic mucosa appeared normal. Next TCS due 10/2013.  Marland Kitchen Esophagogastroduodenoscopy  06/28/2006    FAO:ZHYQMV esophagus. Small hiatal hernia. Otherwise normal stomach, D1 and D2, status post passage of a 56 [place] Maloney dilator  . Colonoscopy  06/28/2006    RMR: Internal hemorrhoids. Diminutive rectal polyps, cold biopsied/ Pancolonic diverticula. Polyps in the right colon removed   . Esophagogastroduodenoscopy (egd) with esophageal dilation N/A 07/14/2013  RMR: Abnormal esophagus of uncertain significance-status post esophageal biospy. small hiatal hernia.   . Esophagogastroduodenoscopy (egd) with propofol N/A 05/20/2015    RMR: NORMAL EGD status post maloney dilation   . Maloney dilation N/A 05/20/2015    Procedure: Elease Hashimoto DILATION;  Surgeon: Corbin Ade, MD;  Location: [facility];  Service: Endoscopy;  Laterality: N/A;  Maloney dilator # 54  . Colonoscopy with propofol N/A 05/20/2015    RMR:  Pancolonic diverticulosis. Redundant colon     Current Outpatient Prescriptions  Medication Sig Dispense Refill  . ALPRAZolam (XANAX) 0.5 MG tablet Take one in the am and two at bedtime 90 tablet 2  . amLODipine (NORVASC) 5 MG tablet Take 5 mg by mouth daily.    . clindamycin (CLEOCIN) 300 MG capsule Two caps by mouth prior to dental procedure 2 capsule 5  . cyclobenzaprine (FLEXERIL) 10 MG tablet Take 1 tablet (10 mg total) by mouth 3 (three) times daily. For spasms 60 tablet 5  . divalproex (DEPAKOTE) 250 MG DR tablet Take 3 tablets (750 mg total) by mouth at bedtime. 90 tablet 2  . furosemide (LASIX) 20 MG tablet Take 20 mg by mouth daily.    Marland Kitchen levothyroxine (LEVOTHROID) 150 MCG tablet Take 175 mcg by mouth daily.     Marland Kitchen LINZESS 145 MCG CAPS capsule TAKE ONE CAPSULE BY MOUTH DAILY 30 capsule 5  . omeprazole (PRILOSEC) 20 MG capsule Take 1 capsule (20 mg total) by mouth 2 (two) times daily before a meal. 30 capsule 3  . oxybutynin (DITROPAN-XL) 5 MG 24 hr tablet Take 5 mg by mouth daily.  0  . oxyCODONE-acetaminophen (PERCOCET) 7.5-325 MG tablet Take 1 tablet by mouth every 6 (six) hours as needed. 120 tablet 0  . rosuvastatin (CRESTOR) 40 MG tablet Take 40 mg by mouth daily.      . traZODone (DESYREL) 100 MG tablet Take 1 tablet (100 mg total) by mouth at bedtime. 30 tablet 2  . clindamycin (CLEOCIN) 300 MG capsule Two caps by mouth prior to dental treatment (Patient not taking: Reported on 08/09/2015) 2 capsule 3  . polyethylene glycol-electrolytes (NULYTELY/GOLYTELY) 420 G solution Take 4,000 mLs by mouth once. (Patient not taking: Reported on 08/09/2015) 4000 mL 0   No current facility-administered medications for this visit.    Allergies as of 08/09/2015 - Review Complete 08/09/2015  Allergen Reaction Noted  . Neomycin-bacitracin zn-polymyx Other (See Comments) 02/27/2008  . Penicillins Hives   . Sulfonamide derivatives Hives and Swelling 02/27/2008    Family History  Problem  Relation Age of Onset  . Heart disease    . Diabetes    . Alcohol abuse    . Alcohol abuse Mother   . Anxiety disorder Mother   . Alcohol abuse Father   . Anxiety disorder Father   . Bipolar disorder Sister   . Alcohol abuse Brother   . Anxiety disorder Brother   . Drug abuse Brother   . Dementia Paternal Aunt   . Depression Neg Hx   . OCD Neg Hx   . Paranoid behavior Neg Hx   . Schizophrenia Neg Hx   . Sexual abuse Neg Hx   . Physical abuse Neg Hx   . ADD / ADHD Grandchild   . ADD / ADHD Grandchild   . Drug abuse Brother   . Alcohol abuse Brother   . Anxiety disorder Brother   . Seizures Brother   . Alcohol abuse Brother   . Anxiety disorder Brother   .  Seizures Brother   . Alcohol abuse Brother   . Anxiety disorder Brother   . Alcohol abuse Brother   . Anxiety disorder Brother   . Colon cancer Neg Hx     Social History   Social History  . Marital Status: Married    Spouse Name: N/A  . Number of Children: N/A  . Years of Education: 12th grade   Occupational History  . disabled    Social History Main Topics  . Smoking status: Former Smoker -- 0.25 packs/day for 5 years    Types: Cigarettes    Quit date: 06/26/2003  . Smokeless tobacco: Never Used  . Alcohol Use: Yes     Comment: occasionally  . Drug Use: No     Comment: has a past history of street drug use  . Sexual Activity: Yes    Birth Control/ Protection: Surgical   Other Topics Concern  . None   Social History Narrative    Review of Systems: General: Negative for anorexia, weight loss, fever, chills, fatigue, weakness. ENT: Negative for hoarseness, difficulty swallowing. CV: Negative for chest pain, angina, palpitations, peripheral edema.  Respiratory: Negative for dyspnea at rest, cough, sputum, wheezing.  GI: See history of present illness. Derm: Negative for rash or itching.  Endo: Negative for unusual weight change.    Physical Exam: BP 126/72 mmHg  Pulse 84  Temp(Src) 97.4 F  (36.3 C) (Oral)  Ht 5\' 3"  (1.6 m)  Wt 233 lb 9.6 oz (105.96 kg)  BMI 41.39 kg/m2 General:   Alert and oriented. Pleasant and cooperative. Well-nourished and well-developed.  Head:  Normocephalic and atraumatic. Eyes:  Without icterus, sclera clear and conjunctiva pink.  Ears:  Normal auditory acuity. Cardiovascular:  S1, S2 present without murmurs appreciated. Extremities without clubbing or edema. Respiratory:  Clear to auscultation bilaterally. No wheezes, rales, or rhonchi. No distress.  Gastrointestinal:  +BS, soft, and non-distended. Mild to moderate epigastric TTP. No HSM noted. No guarding or rebound. No masses appreciated.  Rectal:  Deferred  Neurologic:  Alert and oriented x4;  grossly normal neurologically. Psych:  Alert and cooperative. Normal mood and affect. Heme/Lymph/Immune: No excessive bruising noted.    08/09/2015 9:07 AM

## 2015-08-09 NOTE — Assessment & Plan Note (Signed)
Dysphagia symptoms resolved at this point [facility] Return for follow-up as needed for any recurrent symptoms. Continue taking Prilosec.

## 2015-08-09 NOTE — Patient Instructions (Signed)
1.  Have your labs drawn when you're able to. 2.  We will have you return for follow-up in 4 weeks o check the status of your abdominal pain. We may need to consider a CT of your abdomen if its persistent 3. Continue taking Prilosec (Omeprazole) and Linzess as you have been. 4. Call if any changes or worsening symptoms.

## 2015-08-09 NOTE — Assessment & Plan Note (Signed)
GERD symptoms well controlled on Prilosec. We'll have occasional breakthrough symptoms depending on dietary choices. She is aware of her triggers and at times will just used to suffer in order to eat something and she wants. Advised to avoid dietary triggers, continue Prilosec, follow-up as needed.

## 2015-08-09 NOTE — Progress Notes (Signed)
cc'ed to pcp °

## 2015-08-19 ENCOUNTER — Other Ambulatory Visit: Payer: Self-pay | Admitting: *Deleted

## 2015-08-19 DIAGNOSIS — M1711 Unilateral primary osteoarthritis, right knee: Secondary | ICD-10-CM

## 2015-08-19 MED ORDER — OXYCODONE-ACETAMINOPHEN 7.5-325 MG PO TABS: 1.0000 | ORAL_TABLET | Freq: Four times a day (QID) | ORAL | Status: DC | PRN

## 2015-08-19 NOTE — Telephone Encounter (Signed)
Prescription available, patient aware  

## 2015-08-19 NOTE — Telephone Encounter (Signed)
Patient called requesting her oxycodone to be refilled. Please advise [phone]

## 2015-08-24 LAB — CBC WITH DIFFERENTIAL/PLATELET
Basophils Absolute: 0 10*3/uL (ref 0.0–0.1)
Basophils Relative: 0 % (ref 0–1)
Eosinophils Absolute: 0 10*3/uL (ref 0.0–0.7)
Eosinophils Relative: 1 % (ref 0–5)
HCT: 37.4 % (ref 36.0–46.0)
Hemoglobin: 12.4 g/dL (ref 12.0–15.0)
Lymphocytes Relative: 54 % — ABNORMAL HIGH (ref 12–46)
Lymphs Abs: 2.6 10*3/uL (ref 0.7–4.0)
MCH: 27.1 pg (ref 26.0–34.0)
MCHC: 33.2 g/dL (ref 30.0–36.0)
MCV: 81.7 fL (ref 78.0–100.0)
MPV: 11.4 fL (ref 8.6–12.4)
Monocytes Absolute: 0.4 10*3/uL (ref 0.1–1.0)
Monocytes Relative: 8 % (ref 3–12)
Neutro Abs: 1.8 10*3/uL (ref 1.7–7.7)
Neutrophils Relative %: 37 % — ABNORMAL LOW (ref 43–77)
Platelets: 192 10*3/uL (ref 150–400)
RBC: 4.58 MIL/uL (ref 3.87–5.11)
RDW: 14.7 % (ref 11.5–15.5)
WBC: 4.9 10*3/uL (ref 4.0–10.5)

## 2015-08-24 LAB — COMPREHENSIVE METABOLIC PANEL
ALT: 8 U/L (ref 6–29)
AST: 13 U/L (ref 10–35)
Albumin: 3.4 g/dL — ABNORMAL LOW (ref 3.6–5.1)
Alkaline Phosphatase: 65 U/L (ref 33–130)
BUN: 11 mg/dL (ref 7–25)
CO2: 29 mmol/L (ref 20–31)
Calcium: 8.8 mg/dL (ref 8.6–10.4)
Chloride: 103 mmol/L (ref 98–110)
Creat: 0.76 mg/dL (ref 0.50–0.99)
Glucose, Bld: 82 mg/dL (ref 65–99)
Potassium: 4.4 mmol/L (ref 3.5–5.3)
Sodium: 139 mmol/L (ref 135–146)
Total Bilirubin: 0.6 mg/dL (ref 0.2–1.2)
Total Protein: 6.5 g/dL (ref 6.1–8.1)

## 2015-08-24 LAB — LIPASE: Lipase: 11 U/L (ref 7–60)

## 2015-08-25 ENCOUNTER — Ambulatory Visit (HOSPITAL_COMMUNITY): Payer: Self-pay | Admitting: Psychiatry

## 2015-08-25 ENCOUNTER — Encounter (HOSPITAL_COMMUNITY): Payer: Self-pay | Admitting: Psychiatry

## 2015-09-08 ENCOUNTER — Ambulatory Visit: Payer: Self-pay | Admitting: Nurse Practitioner

## 2015-09-22 ENCOUNTER — Encounter: Payer: Self-pay | Admitting: Nurse Practitioner

## 2015-09-22 ENCOUNTER — Ambulatory Visit (INDEPENDENT_AMBULATORY_CARE_PROVIDER_SITE_OTHER): Payer: PPO | Admitting: Nurse Practitioner

## 2015-09-22 VITALS — BP 124/73 | HR 84 | Temp 97.1°F | Ht 63.0 in | Wt 229.4 lb

## 2015-09-22 DIAGNOSIS — R1319 Other dysphagia: Secondary | ICD-10-CM

## 2015-09-22 DIAGNOSIS — K219 Gastro-esophageal reflux disease without esophagitis: Secondary | ICD-10-CM

## 2015-09-22 DIAGNOSIS — R1314 Dysphagia, pharyngoesophageal phase: Secondary | ICD-10-CM | POA: Diagnosis not present

## 2015-09-22 DIAGNOSIS — K5900 Constipation, unspecified: Secondary | ICD-10-CM | POA: Diagnosis not present

## 2015-09-22 DIAGNOSIS — R1310 Dysphagia, unspecified: Secondary | ICD-10-CM

## 2015-09-22 MED ORDER — PANTOPRAZOLE SODIUM 40 MG PO TBEC: 40.0000 mg | DELAYED_RELEASE_TABLET | Freq: Every day | ORAL | Status: DC

## 2015-09-22 NOTE — Progress Notes (Signed)
Referring Provider: Elfredia Nevins, MD Primary Care Physician:  Cassell Smiles., MD Primary GI:  Dr. Jena Gauss  Chief Complaint  Patient presents with  . Follow-up    HPI:   65 year old female presents for follow-up on constipation, GERD, dysphagia and status post procedures. She was last seen on our office 05/05/2015 and at that time was having some improvement in her GERD with PPI, her dose was increased to 20 mg Prilosec twice a day. For constipation we started her on Linzess 145 g the samples and prescription. For her dysphagia and history of adenomatous polyp of the colon she was referred for endoscopy with possible dilation and colonoscopy. EGD with dilation was completed on 05/20/2015 which found normal EGD status post Maloney dilation with a 54 French dilator. Recommend continue Prilosec 20 mg daily, follow-up office visit. Colonoscopy completed on the same day found adequate prep, pancolonic diverticulosis, redundant colon. Recommend repeat endoscopy in 5 years  Today she states she's doing well. Dysphagia resolved status post dilation. Continued occasional GERD breakthrough symptoms about 2 times a week. She is still taking Prilosec twice daily. Is taking Linzess but still having trouble with her bowels. Is taking 145 mcg bid. Has a bowel movement once every 2-3 days. Still takes OTC laxative to help her go, she thinks it's Miralax, which she takes twice a day. When taking both still does not have regular bowel movements.  When she's constipated she has abdominal pain. Denies other abdominal pain, hematochezia, melena, N/V. Denies chest pain, dyspnea, dizziness, lightheadedness, syncope, near syncope. Denies any other upper or lower GI symptoms.  Past Medical History  Diagnosis Date  . Hypertension   . Allergic rhinitis   . Anxiety   . Depression   . GERD (gastroesophageal reflux disease)   . High cholesterol   . Hypothyroid   . Chronic back pain   . Arthritis   . Immature  cataract   . Sleep apnea     STOP BANG score=4  . Seizures (HCC) 20 yrs ago    unknown etiology-on meds  . Headaches, cluster 3 yrs ago    last one  . Mania (HCC)   . 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. PTSD (post-traumatic stress disorder)     Past Surgical History  Procedure Laterality Date  . Left foot  x 2  . Foot surgery      left foot-  . Partial hysterectomy    . Oophoectomy  bilateral  . Cholecystectomy    . Lumbar disc surgery  L4-5  . Back surgery    . Knee arthroscopy  01/2012    right  . Total knee arthroplasty  07/01/2012    Procedure: TOTAL KNEE ARTHROPLASTY;  Surgeon: Vickki Hearing, MD;  Location: [facility];  Service: Orthopedics;  Laterality: Right;  . Total knee revision Right 01/03/2013    Procedure: Patellaplasty Right Knee;  Surgeon: Vickki Hearing, MD;  Location: [facility];  Service: Orthopedics;  Laterality: Right;  . Knee arthroscopy with lateral release Right 01/03/2013    Procedure: Lateral Release Patella Right Knee;  Surgeon: Vickki Hearing, MD;  Location: [facility];  Service: Orthopedics;  Laterality: Right;  . Incision and drainage Right 01/03/2013    Procedure: INCISION AND DRAINAGE;  Surgeon: Vickki Hearing, MD;  Location: [facility];  Service: Orthopedics;  Laterality: Right;  . Esophagogastroduodenoscopy  10/16/2008    WUJ:WJXBJ hiatal hernia otherwise normal esophagus, stomach  D1, D2, status post passage of a 56-French  Maloney dilator  . Colonoscopy  10/16/2008    TKZ:[id number]:Hemorrhoids, otherwise normal rectum; pancolonic diverticula the remainder of the colonic mucosa appeared normal. Next TCS due 10/2013.  Marland Kitchen. Esophagogastroduodenoscopy  06/28/2006    DUK:[id number]:Normal esophagus. Small hiatal hernia. Otherwise normal stomach, D1 and D2, status post passage of a 56 [place] Maloney dilator  . Colonoscopy  06/28/2006    RMR: Internal hemorrhoids. Diminutive rectal polyps, cold biopsied/ Pancolonic diverticula. Polyps in the right colon removed   .  Esophagogastroduodenoscopy (egd) with esophageal dilation N/A 07/14/2013    RMR: Abnormal esophagus of uncertain significance-status post esophageal biospy. small hiatal hernia.   . Esophagogastroduodenoscopy (egd) with propofol N/A 05/20/2015    RMR: NORMAL EGD status post maloney dilation   . Maloney dilation N/A 05/20/2015    Procedure: Elease HashimotoMALONEY DILATION;  Surgeon: Corbin Adeobert M Rourk, MD;  Location: [facility];  Service: Endoscopy;  Laterality: N/A;  Maloney dilator # 54  . Colonoscopy with propofol N/A 05/20/2015    RMR: Pancolonic diverticulosis. Redundant colon     Current Outpatient Prescriptions  Medication Sig Dispense Refill  . ALPRAZolam (XANAX) 0.5 MG tablet Take one in the am and two at bedtime 90 tablet 2  . amLODipine (NORVASC) 5 MG tablet Take 5 mg by mouth daily.    . clindamycin (CLEOCIN) 300 MG capsule Two caps by mouth prior to dental procedure 2 capsule 5  . clindamycin (CLEOCIN) 300 MG capsule Two caps by mouth prior to dental treatment 2 capsule 3  . cyclobenzaprine (FLEXERIL) 10 MG tablet Take 1 tablet (10 mg total) by mouth 3 (three) times daily. For spasms 60 tablet 5  . divalproex (DEPAKOTE) 250 MG DR tablet Take 3 tablets (750 mg total) by mouth at bedtime. 90 tablet 2  . furosemide (LASIX) 20 MG tablet Take 20 mg by mouth daily.    Marland Kitchen. levothyroxine (LEVOTHROID) 150 MCG tablet Take 175 mcg by mouth daily.     Marland Kitchen. LINZESS 145 MCG CAPS capsule TAKE ONE CAPSULE BY MOUTH DAILY 30 capsule 5  . omeprazole (PRILOSEC) 20 MG capsule Take 1 capsule (20 mg total) by mouth 2 (two) times daily before a meal. 30 capsule 3  . oxybutynin (DITROPAN-XL) 5 MG 24 hr tablet Take 5 mg by mouth daily.  0  . oxyCODONE-acetaminophen (PERCOCET) 7.5-325 MG tablet Take 1 tablet by mouth every 6 (six) hours as needed. 120 tablet 0  . rosuvastatin (CRESTOR) 40 MG tablet Take 40 mg by mouth daily.      . traZODone (DESYREL) 100 MG tablet Take 1 tablet (100 mg total) by mouth at bedtime. 30 tablet 2  .  polyethylene glycol-electrolytes (NULYTELY/GOLYTELY) 420 G solution Take 4,000 mLs by mouth once. (Patient not taking: Reported on 08/09/2015) 4000 mL 0   No current facility-administered medications for this visit.    Allergies as of 09/22/2015 - Review Complete 09/22/2015  Allergen Reaction Noted  . Neomycin-bacitracin zn-polymyx Other (See Comments) 02/27/2008  . Penicillins Hives   . Sulfonamide derivatives Hives and Swelling 02/27/2008    Family History  Problem Relation Age of Onset  . Heart disease    . Diabetes    . Alcohol abuse    . Alcohol abuse Mother   . Anxiety disorder Mother   . Alcohol abuse Father   . Anxiety disorder Father   . Bipolar disorder Sister   . Alcohol abuse Brother   . Anxiety disorder Brother   . Drug abuse Brother   . Dementia Paternal Aunt   .  Depression Neg Hx   . OCD Neg Hx   . Paranoid behavior Neg Hx   . Schizophrenia Neg Hx   . Sexual abuse Neg Hx   . Physical abuse Neg Hx   . ADD / ADHD Grandchild   . ADD / ADHD Grandchild   . Drug abuse Brother   . Alcohol abuse Brother   . Anxiety disorder Brother   . Seizures Brother   . Alcohol abuse Brother   . Anxiety disorder Brother   . Seizures Brother   . Alcohol abuse Brother   . Anxiety disorder Brother   . Alcohol abuse Brother   . Anxiety disorder Brother   . Colon cancer Neg Hx     Social History   Social History  . Marital Status: Married    Spouse Name: N/A  . Number of Children: N/A  . Years of Education: 12th grade   Occupational History  . disabled    Social History Main Topics  . Smoking status: Former Smoker -- 0.25 packs/day for 5 years    Types: Cigarettes    Quit date: 06/26/2003  . Smokeless tobacco: Never Used  . Alcohol Use: Yes     Comment: occasionally  . Drug Use: No     Comment: has a past history of street drug use  . Sexual Activity: Yes    Birth Control/ Protection: Surgical   Other Topics Concern  . None   Social History Narrative     Review of Systems: General: Negative for anorexia, weight loss, fever, chills, fatigue, weakness. ENT: Negative for hoarseness, difficulty swallowing. CV: Negative for chest pain, angina, palpitations, peripheral edema.  Respiratory: Negative for dyspnea at rest, cough, sputum, wheezing.  GI: See history of present illness. Endo: Negative for unusual weight change.    Physical Exam: BP 124/73 mmHg  Pulse 84  Temp(Src) 97.1 F (36.2 C) (Oral)  Ht 5\' 3"  (1.6 m)  Wt 229 lb 6.4 oz (104.055 kg)  BMI 40.65 kg/m2 General:   Morbidly obese, alert and oriented. Pleasant and cooperative. Well-nourished and well-developed.  Head:  Normocephalic and atraumatic. Eyes:  Without icterus, sclera clear and conjunctiva pink.  Ears:  Normal auditory acuity. Cardiovascular:  S1, S2 present without murmurs appreciated. Extremities without clubbing or edema. Respiratory:  Clear to auscultation bilaterally. No wheezes, rales, or rhonchi. No distress.  Gastrointestinal:  +BS, soft, and non-distended. Mild TTP lower abdomen and epigastric area. No HSM noted. No guarding or rebound. No masses appreciated.  Rectal:  Deferred  Neurologic:  Alert and oriented x4;  grossly normal neurologically. Psych:  Alert and cooperative. Normal mood and affect.    09/22/2015 11:14 AM

## 2015-09-22 NOTE — Progress Notes (Signed)
cc'ed to pcp °

## 2015-09-22 NOTE — Assessment & Plan Note (Signed)
GERD symptoms persist despite twice a day Prilosec dosing. Breakthrough symptoms 2-3 times a week. At this point we'll have her stop Prilosec and start Protonix 40 mg once a day in the morning. Return for follow-up in 6-8 weeks for further evaluation.

## 2015-09-22 NOTE — Assessment & Plan Note (Signed)
Patient with continued constipation and bowel irregularity. She was started on Linzess 145 g. She took upon herself to double her dose to 145 g twice a day. In addition she is also taking MiraLAX twice a day and still has irregularity with a bowel movement approximately every 2-3 days. She does take narcotics for chronic pain. At this point we'll have her stop Linzess and will start Amitiza 24 g twice a day on a full stomach to prevent nausea. We have provided samples to last 2 weeks. She is to call us 2 weeks from now to notify us of the effectiveness. Can consider changing to another agent if Amitiza is also ineffective. Return for follow-up in 6-8 weeks otherwise.

## 2015-09-22 NOTE — Patient Instructions (Signed)
1. Stop taking Linzess. 2. Stop taking Prilosec. 3. I sent in a prescription to your pharmacy for Protonix. Take one 40 mg tab, once a day, 30 minutes before your first meal of the day. 4. Start taking Amitiza 24 g twice a day. Take this half way through a meal (on a full stomach) to prevent possible nausea. 5. Call us in 2 weeks and let us know if the Amitiza is working any better for your constipation. 6. Return for follow-up in 6-8 weeks.

## 2015-09-22 NOTE — Assessment & Plan Note (Signed)
Dysphagia symptoms resolved status post endoscopy with 54 French Maloney dilation. Continue to monitor and notify us of any recurrent symptoms.

## 2015-09-23 ENCOUNTER — Telehealth: Payer: Self-pay | Admitting: Internal Medicine

## 2015-09-23 NOTE — Telephone Encounter (Signed)
[phone] MEDICATION THAT ERIC PUT HER ON ,AMITIZA, HAS NOT WORKED IN THE PAST.  THE Karlene EinsteinLINZESS ALSO DID NOT WORK  PLEASE ADVISE

## 2015-09-23 NOTE — Telephone Encounter (Signed)
Routing to EG 

## 2015-09-24 ENCOUNTER — Other Ambulatory Visit: Payer: Self-pay | Admitting: *Deleted

## 2015-09-24 ENCOUNTER — Telehealth: Payer: Self-pay | Admitting: *Deleted

## 2015-09-24 DIAGNOSIS — M1711 Unilateral primary osteoarthritis, right knee: Secondary | ICD-10-CM

## 2015-09-24 MED ORDER — OXYCODONE-ACETAMINOPHEN 7.5-325 MG PO TABS: 1.0000 | ORAL_TABLET | Freq: Four times a day (QID) | ORAL | Status: DC | PRN

## 2015-09-24 NOTE — Telephone Encounter (Signed)
Please have her continue Linzess for a week. She was just started on it the day before she called.

## 2015-09-24 NOTE — Telephone Encounter (Signed)
Patient called requesting precet to be refilled. Please advise

## 2015-09-27 ENCOUNTER — Other Ambulatory Visit: Payer: Self-pay | Admitting: *Deleted

## 2015-09-27 DIAGNOSIS — M1711 Unilateral primary osteoarthritis, right knee: Secondary | ICD-10-CM

## 2015-09-27 MED ORDER — OXYCODONE-ACETAMINOPHEN 7.5-325 MG PO TABS: 1.0000 | ORAL_TABLET | Freq: Four times a day (QID) | ORAL | Status: DC | PRN

## 2015-09-27 NOTE — Telephone Encounter (Signed)
Prescription available, called patient, no answer 

## 2015-09-27 NOTE — Telephone Encounter (Signed)
Spoke with the pt, she has been taking the samples we gave her of amitiza and its not working. Last bm was 5 days ago. She has tried liinzess in the past as well and it didn't work.

## 2015-09-28 ENCOUNTER — Ambulatory Visit (INDEPENDENT_AMBULATORY_CARE_PROVIDER_SITE_OTHER): Payer: PPO

## 2015-09-28 ENCOUNTER — Ambulatory Visit (INDEPENDENT_AMBULATORY_CARE_PROVIDER_SITE_OTHER): Payer: PPO | Admitting: Orthopedic Surgery

## 2015-09-28 VITALS — BP 139/77 | Ht 63.0 in | Wt 229.0 lb

## 2015-09-28 DIAGNOSIS — M1711 Unilateral primary osteoarthritis, right knee: Secondary | ICD-10-CM

## 2015-09-28 DIAGNOSIS — M25562 Pain in left knee: Secondary | ICD-10-CM

## 2015-09-28 DIAGNOSIS — R29898 Other symptoms and signs involving the musculoskeletal system: Secondary | ICD-10-CM

## 2015-09-28 DIAGNOSIS — M129 Arthropathy, unspecified: Secondary | ICD-10-CM

## 2015-09-28 DIAGNOSIS — M5136 Other intervertebral disc degeneration, lumbar region: Secondary | ICD-10-CM | POA: Diagnosis not present

## 2015-09-28 MED ORDER — OXYCODONE-ACETAMINOPHEN 7.5-325 MG PO TABS: 1.0000 | ORAL_TABLET | Freq: Four times a day (QID) | ORAL | Status: DC | PRN

## 2015-09-28 NOTE — Progress Notes (Signed)
Patient ID: Diane [name], female   DOB: 03-20-1951, 65 y.o.   MRN: [id number]  ESTABLISHED PATIENT NEW PROBLEM   Chief Complaint  Patient presents with  . Knee Pain    Left knee pain.     Diane [name] is a 65 y.o. female.   HPI 65 year old female status post right knee replacement, gated Postop arthrofibrosis presents for evaluation of left knee with a primary complaint of left knee giving way  Pain is mild quality aching duration 4-6 weeks timing the leg gives way when she's walking standing context no trauma  History no DOS additional symptoms no GI GU symptoms does have chronic back pain status post laminectomy with ongoing symptoms.  MRI was done 2014 and she had protruding disc but at that time was having right leg symptoms. Review of Systems See hpi  Past Medical History  Diagnosis Date  . Hypertension   . Allergic rhinitis   . Anxiety   . Depression   . GERD (gastroesophageal reflux disease)   . High cholesterol   . Hypothyroid   . Chronic back pain   . Arthritis   . Immature cataract   . Sleep apnea     STOP BANG score=4  . Seizures (HCC) 20 yrs ago    unknown etiology-on meds  . Headaches, cluster 3 yrs ago    last one  . Mania (HCC)   . 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. PTSD (post-traumatic stress disorder)     Past Surgical History  Procedure Laterality Date  . Left foot  x 2  . Foot surgery      left foot-  . Partial hysterectomy    . Oophoectomy  bilateral  . Cholecystectomy    . Lumbar disc surgery  L4-5  . Back surgery    . Knee arthroscopy  01/2012    right  . Total knee arthroplasty  07/01/2012    Procedure: TOTAL KNEE ARTHROPLASTY;  Surgeon: Vickki Hearing, MD;  Location: [facility];  Service: Orthopedics;  Laterality: Right;  . Total knee revision Right 01/03/2013    Procedure: Patellaplasty Right Knee;  Surgeon: Vickki Hearing, MD;  Location: [facility];  Service: Orthopedics;  Laterality: Right;  . Knee arthroscopy with lateral release  Right 01/03/2013    Procedure: Lateral Release Patella Right Knee;  Surgeon: Vickki Hearing, MD;  Location: [facility];  Service: Orthopedics;  Laterality: Right;  . Incision and drainage Right 01/03/2013    Procedure: INCISION AND DRAINAGE;  Surgeon: Vickki Hearing, MD;  Location: [facility];  Service: Orthopedics;  Laterality: Right;  . Esophagogastroduodenoscopy  10/16/2008    WUJ:WJXBJ hiatal hernia otherwise normal esophagus, stomach  D1, D2, status post passage of a 56-French Maloney dilator  . Colonoscopy  10/16/2008    YNW:GNFAOZHYQMV, otherwise normal rectum; pancolonic diverticula the remainder of the colonic mucosa appeared normal. Next TCS due 10/2013.  Marland Kitchen Esophagogastroduodenoscopy  06/28/2006    HQI:ONGEXB esophagus. Small hiatal hernia. Otherwise normal stomach, D1 and D2, status post passage of a 56 [place] Maloney dilator  . Colonoscopy  06/28/2006    RMR: Internal hemorrhoids. Diminutive rectal polyps, cold biopsied/ Pancolonic diverticula. Polyps in the right colon removed   . Esophagogastroduodenoscopy (egd) with esophageal dilation N/A 07/14/2013    RMR: Abnormal esophagus of uncertain significance-status post esophageal biospy. small hiatal hernia.   . Esophagogastroduodenoscopy (egd) with propofol N/A 05/20/2015    RMR: NORMAL EGD status post maloney dilation   . Maloney dilation  N/A 05/20/2015    Procedure: Elease Hashimoto DILATION;  Surgeon: Corbin Ade, MD;  Location: [facility];  Service: Endoscopy;  Laterality: N/A;  Maloney dilator # 54  . Colonoscopy with propofol N/A 05/20/2015    RMR: Pancolonic diverticulosis. Redundant colon     Family History  Problem Relation Age of Onset  . Heart disease    . Diabetes    . Alcohol abuse    . Alcohol abuse Mother   . Anxiety disorder Mother   . Alcohol abuse Father   . Anxiety disorder Father   . Bipolar disorder Sister   . Alcohol abuse Brother   . Anxiety disorder Brother   . Drug abuse Brother   . Dementia Paternal Aunt   .  Depression Neg Hx   . OCD Neg Hx   . Paranoid behavior Neg Hx   . Schizophrenia Neg Hx   . Sexual abuse Neg Hx   . Physical abuse Neg Hx   . ADD / ADHD Grandchild   . ADD / ADHD Grandchild   . Drug abuse Brother   . Alcohol abuse Brother   . Anxiety disorder Brother   . Seizures Brother   . Alcohol abuse Brother   . Anxiety disorder Brother   . Seizures Brother   . Alcohol abuse Brother   . Anxiety disorder Brother   . Alcohol abuse Brother   . Anxiety disorder Brother   . Colon cancer Neg Hx     Social History Social History  Substance Use Topics  . Smoking status: Former Smoker -- 0.25 packs/day for 5 years    Types: Cigarettes    Quit date: 06/26/2003  . Smokeless tobacco: Never Used  . Alcohol Use: Yes     Comment: occasionally    Allergies  Allergen Reactions  . Neomycin-Bacitracin Zn-Polymyx Other (See Comments)    Reaction: skin starts to become raw and peels off  . Penicillins Hives  . Sulfonamide Derivatives Hives and Swelling    Current Outpatient Prescriptions  Medication Sig Dispense Refill  . ALPRAZolam (XANAX) 0.5 MG tablet Take one in the am and two at bedtime 90 tablet 2  . amLODipine (NORVASC) 5 MG tablet Take 5 mg by mouth daily.    . clindamycin (CLEOCIN) 300 MG capsule Two caps by mouth prior to dental procedure 2 capsule 5  . clindamycin (CLEOCIN) 300 MG capsule Two caps by mouth prior to dental treatment 2 capsule 3  . cyclobenzaprine (FLEXERIL) 10 MG tablet Take 1 tablet (10 mg total) by mouth 3 (three) times daily. For spasms 60 tablet 5  . divalproex (DEPAKOTE) 250 MG DR tablet Take 3 tablets (750 mg total) by mouth at bedtime. 90 tablet 2  . furosemide (LASIX) 20 MG tablet Take 20 mg by mouth daily.    Marland Kitchen levothyroxine (LEVOTHROID) 150 MCG tablet Take 175 mcg by mouth daily.     Marland Kitchen LINZESS 145 MCG CAPS capsule TAKE ONE CAPSULE BY MOUTH DAILY 30 capsule 5  . omeprazole (PRILOSEC) 20 MG capsule Take 1 capsule (20 mg total) by mouth 2 (two)  times daily before a meal. 30 capsule 3  . oxybutynin (DITROPAN-XL) 5 MG 24 hr tablet Take 5 mg by mouth daily.  0  . oxyCODONE-acetaminophen (PERCOCET) 7.5-325 MG tablet Take 1 tablet by mouth every 6 (six) hours as needed. 120 tablet 0  . pantoprazole (PROTONIX) 40 MG tablet Take 1 tablet (40 mg total) by mouth daily. 90 tablet 3  . polyethylene glycol-electrolytes (  NULYTELY/GOLYTELY) 420 G solution Take 4,000 mLs by mouth once. (Patient not taking: Reported on 08/09/2015) 4000 mL 0  . rosuvastatin (CRESTOR) 40 MG tablet Take 40 mg by mouth daily.      . traZODone (DESYREL) 100 MG tablet Take 1 tablet (100 mg total) by mouth at bedtime. 30 tablet 2   No current facility-administered medications for this visit.       Physical Exam Blood pressure 139/77, height  (1.6 m), weight 229 lb (103.874 kg). Physical Exam The patient is well developed well nourished and well groomed. Orientation to person place and time is normal  Mood is pleasant. Ambulatory status is abnormal  Skin remains intact without laceration ulceration or erythema Gross motor exam is intact without atrophy. Muscle tone normal grade 5 motor strength No gross neurologic deficits Vascular exam normal Inspection left knee no effusion ligament stable collateral and cruciate range of motion 5-1 20 McMurray's negative joint lines mildly tender medially    Data Reviewed  I have independently reviewed the radiographs and my interpretation is:  Plain films knee mild medial compartment arthrosis  MRI 2014 Left lateral recess protrusion at L4-5. There is mild narrowing in the left lateral recess and foramen. Left laminotomy defect at this level is noted.   Electronically Signed  By: Drusilla Kanner M.D.  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

## 2015-09-29 NOTE — Telephone Encounter (Signed)
I called patient and made her aware that her Prescription is ready to be picked up.

## 2015-10-01 ENCOUNTER — Ambulatory Visit (HOSPITAL_COMMUNITY): Payer: Self-pay | Admitting: Psychiatry

## 2015-10-04 ENCOUNTER — Other Ambulatory Visit: Payer: Self-pay | Admitting: *Deleted

## 2015-10-04 ENCOUNTER — Telehealth: Payer: Self-pay | Admitting: *Deleted

## 2015-10-04 DIAGNOSIS — M519 Unspecified thoracic, thoracolumbar and lumbosacral intervertebral disc disorder: Secondary | ICD-10-CM

## 2015-10-04 NOTE — Telephone Encounter (Signed)
REFERRAL FAXED TO  NEUROSURGERY 

## 2015-10-07 NOTE — Telephone Encounter (Signed)
Please tell her the next option we can try is Movantik,  once a day in the morning on an empty stomach. She can pick up samples at the front desk to try for 2 weeks.

## 2015-10-09 ENCOUNTER — Other Ambulatory Visit (HOSPITAL_COMMUNITY): Payer: Self-pay | Admitting: Psychiatry

## 2015-10-11 NOTE — Telephone Encounter (Signed)
Tried to call pt- NA-LMOM with instructions. Samples are at the front desk. 

## 2015-10-11 NOTE — Telephone Encounter (Signed)
Pt called back and is aware

## 2015-10-20 ENCOUNTER — Encounter (HOSPITAL_COMMUNITY): Payer: Self-pay | Admitting: Psychiatry

## 2015-10-20 ENCOUNTER — Ambulatory Visit (INDEPENDENT_AMBULATORY_CARE_PROVIDER_SITE_OTHER): Payer: PPO | Admitting: Psychiatry

## 2015-10-20 VITALS — BP 108/68 | HR 88 | Ht 63.0 in | Wt 225.8 lb

## 2015-10-20 DIAGNOSIS — F1010 Alcohol abuse, uncomplicated: Secondary | ICD-10-CM

## 2015-10-20 DIAGNOSIS — F5105 Insomnia due to other mental disorder: Secondary | ICD-10-CM | POA: Diagnosis not present

## 2015-10-20 DIAGNOSIS — F1310 Sedative, hypnotic or anxiolytic abuse, uncomplicated: Secondary | ICD-10-CM

## 2015-10-20 DIAGNOSIS — F3130 Bipolar disorder, current episode depressed, mild or moderate severity, unspecified: Secondary | ICD-10-CM | POA: Diagnosis not present

## 2015-10-20 DIAGNOSIS — F489 Nonpsychotic mental disorder, unspecified: Secondary | ICD-10-CM | POA: Diagnosis not present

## 2015-10-20 MED ORDER — TRAZODONE HCL 100 MG PO TABS: 100.0000 mg | ORAL_TABLET | Freq: Every day | ORAL | Status: DC

## 2015-10-20 MED ORDER — ALPRAZOLAM 0.5 MG PO TABS: ORAL_TABLET | ORAL | Status: DC

## 2015-10-20 MED ORDER — DIVALPROEX SODIUM 250 MG PO DR TAB: 750.0000 mg | DELAYED_RELEASE_TABLET | Freq: Every day | ORAL | Status: DC

## 2015-10-20 NOTE — Progress Notes (Signed)
Patient ID: SUGEY TREVATHAN, female   DOB: 1951-07-31, 65 y.o.   MRN: [id number] Patient ID: SHARANYA TEMPLIN, female   DOB: 06-30-1951, 65 y.o.   MRN: [id number] Patient ID: JADI DEYARMIN, female   DOB: 12-08-1950, 64 y.o.   MRN: [id number] Patient ID: ALIVEAH GALLANT, female   DOB: [birth year], 65 y.o.   MRN: [id number] Patient ID: SERENITIE VINTON, female   DOB: [birth year], 65 y.o.   MRN: [id number] Patient ID: IDONNA HEEREN, female   DOB: 01/03/1951, 65 y.o.   MRN: [id number] Patient ID: DIASHA CASTLEMAN, female   DOB: Apr 16, 1951, 65 y.o.   MRN: [id number] Patient ID: PAYTON MODER, female   DOB: [birth year], 65 y.o.   MRN: [id number] Patient ID: NYESHA CLIFF, female   DOB: [birth year], 65 y.o.   MRN: [id number] [facility] [id number] Progress Note STEPHENY CANAL MRN: [id number] DOB: 06-Apr-1951 Age: 65 y.o.  Date: 10/20/2015  Chief Complaint  Patient presents with  . Depression  . Anxiety  . Follow-up    History of present illness Patient is 65 year old [place] American female who came for her followup appointment. She is currently with her husband in [place] and is on disability  The patient was last seen in March. At that time she told me she was moving to [place] to be with her daughter. She didn't like living down there and she and her grandson have come back. Her grandson got his own place to live and she is now staying in a motel temporarily. Her mood is "up and down" because she is unsure where she is going to live. She's in a lot of pain in her knee and would like to get back on the amitriptyline since she ran out. A nurse practitioner and primary care put her on Celexa without realizing that she's already on Cymbalta. I explained to her that made no sense to be on both. She does admit that she still drinks periodically sometimes too much on weekends. I warned her against this. She denies being severely depressed or suicidal  The patient returns after 3 months. She is living with her,  her grandson and his girlfriend. She asked her husband to move out. She had brought him into take care of him while he had cancer but once his health improved he started drinking smoking and running around. She asked him to leave at the first of the year. He still calls all the time and wants to back together and she declines. She states that this makes her feel bad. She states that she ran out of her medicine about 3 weeks ago due to a missed appointment but she never called to let us know. Since running out of that she's been more depressed and withdrawn. She denies suicidal ideation. She is having more trouble sleeping Current psychotropic medication Xanax 0.5 mg twice a day Trazodone 100 mg daily at bedtime Depakote 750 mg daily at bedtime  Past psychiatric history Patient has history of one psychiatric admission in 2000 needed behavioral [place]. She took overdose on her pills she was also intoxicated with alcohol. Patient has also seen by psychiatrist in 2004. At that time she was seeking more benzodiazepines and pain medication.  Vitals: BP 108/68 mmHg  Pulse 88  Ht '5\' 3"'  (1.6 m)  Wt 225 lb 12.8 oz (102.422 kg)  BMI 40.01 kg/m2  Allergies: Allergies  Allergen Reactions  . Neomycin-Bacitracin Zn-Polymyx Other (See Comments)    Reaction:  skin starts to become raw and peels off  . Penicillins Hives  . Sulfonamide Derivatives Hives and Swelling   Medical History: Past Medical History  Diagnosis Date  . Hypertension   . Allergic rhinitis   . Anxiety   . Depression   . GERD (gastroesophageal reflux disease)   . High cholesterol   . Hypothyroid   . Chronic back pain   . Arthritis   . Immature cataract   . Sleep apnea     STOP BANG score=4  . Seizures ([place]) 20 yrs ago    unknown etiology-on meds  . Headaches, cluster 3 yrs ago    last one  . Mania ([place])   . 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. PTSD (post-traumatic stress disorder)   Patient has history of hypertension,  allergic rhinitis, hypothyroidism, chronic back pain, hyperlipidemia and obesity.  She most recently had surgery on her right knee.  Surgical History: Past Surgical History  Procedure Laterality Date  . Left foot  x 2  . Foot surgery      left foot-  . Partial hysterectomy    . Oophoectomy  bilateral  . Cholecystectomy    . Lumbar disc surgery  L4-5  . Back surgery    . Knee arthroscopy  01/2012    right  . Total knee arthroplasty  07/01/2012    Procedure: TOTAL KNEE ARTHROPLASTY;  Surgeon: Carole Civil, MD;  Location: [facility];  Service: Orthopedics;  Laterality: Right;  . Total knee revision Right 01/03/2013    Procedure: Patellaplasty Right Knee;  Surgeon: Carole Civil, MD;  Location: [facility];  Service: Orthopedics;  Laterality: Right;  . Knee arthroscopy with lateral release Right 01/03/2013    Procedure: Lateral Release Patella Right Knee;  Surgeon: Carole Civil, MD;  Location: [facility];  Service: Orthopedics;  Laterality: Right;  . Incision and drainage Right 01/03/2013    Procedure: INCISION AND DRAINAGE;  Surgeon: Carole Civil, MD;  Location: [facility];  Service: Orthopedics;  Laterality: Right;  . Esophagogastroduodenoscopy  10/16/2008    ENM:MHWKG hiatal hernia otherwise normal esophagus, stomach  D1, D2, status post passage of a 56-French Maloney dilator  . Colonoscopy  10/16/2008    SUP:JSRPRXYVOPF, otherwise normal rectum; pancolonic diverticula the remainder of the colonic mucosa appeared normal. Next TCS due 10/2013.  Marland Kitchen Esophagogastroduodenoscopy  06/28/2006    YTW:KMQKMM esophagus. Small hiatal hernia. Otherwise normal stomach, D1 and D2, status post passage of a 56 [place] Maloney dilator  . Colonoscopy  06/28/2006    RMR: Internal hemorrhoids. Diminutive rectal polyps, cold biopsied/ Pancolonic diverticula. Polyps in the right colon removed   . Esophagogastroduodenoscopy (egd) with esophageal dilation N/A 07/14/2013    RMR: Abnormal esophagus of uncertain  significance-status post esophageal biospy. small hiatal hernia.   . Esophagogastroduodenoscopy (egd) with propofol N/A 05/20/2015    RMR: NORMAL EGD status post maloney dilation   . Maloney dilation N/A 05/20/2015    Procedure: Venia Minks DILATION;  Surgeon: Daneil Dolin, MD;  Location: [facility];  Service: Endoscopy;  Laterality: N/A;  Maloney dilator # 31  . Colonoscopy with propofol N/A 05/20/2015    RMR: Pancolonic diverticulosis. Redundant colon    Family History: family history includes ADD / ADHD in her grandchild and grandchild; Alcohol abuse in her brother, brother, brother, brother, brother, father, and mother; Anxiety disorder in her brother, brother, brother, brother, brother, father, and mother; Bipolar disorder in her sister; Dementia in her paternal aunt; Drug abuse in  her brother and brother; Seizures in her brother and brother. There is no history of Depression, OCD, Paranoid behavior, Schizophrenia, Sexual abuse, Physical abuse, or Colon cancer.   Psychosocial history Patient has been married for 16 years. She has 2 children. She is married however her relationship is very tense. She has history of leaving her husband in the past.  Currently she lives by herself.  Mental status examination Patient is mildly obese female who is casually dressed and fairly groomed.  .  She is cooperative and maintained fair eye contact her mood is somewhat depressed but she appears tired and her affect is subdued She denies suicidal ideation . Her speech is clear and coherent. Her attention and concentration is fair. . She's alert and oriented x3. There are no psychotic symptoms present at this time. She denies any auditory or visual hallucination. Her insight judgment is fair and her impulse control is okay.  Lab Results:  Results for orders placed or performed in visit on 08/09/15 (from the past 8736 hour(s))  CBC with Differential/Platelet   Collection Time: 08/23/15  3:17 PM  Result Value Ref Range    WBC 4.9 4.0 - 10.5 K/uL   RBC 4.58 3.87 - 5.11 MIL/uL   Hemoglobin 12.4 12.0 - 15.0 g/dL   HCT 37.4 36.0 - 46.0 %   MCV 81.7 78.0 - 100.0 fL   MCH 27.1 26.0 - 34.0 pg   MCHC 33.2 30.0 - 36.0 g/dL   RDW 14.7 11.5 - 15.5 %   Platelets 192 150 - 400 K/uL   MPV 11.4 8.6 - 12.4 fL   Neutrophils Relative % 37 (L) 43 - 77 %   Neutro Abs 1.8 1.7 - 7.7 K/uL   Lymphocytes Relative 54 (H) 12 - 46 %   Lymphs Abs 2.6 0.7 - 4.0 K/uL   Monocytes Relative 8 3 - 12 %   Monocytes Absolute 0.4 0.1 - 1.0 K/uL   Eosinophils Relative 1 0 - 5 %   Eosinophils Absolute 0.0 0.0 - 0.7 K/uL   Basophils Relative 0 0 - 1 %   Basophils Absolute 0.0 0.0 - 0.1 K/uL   Smear Review Criteria for review not met   Comprehensive metabolic panel   Collection Time: 08/23/15  3:17 PM  Result Value Ref Range   Sodium 139 135 - 146 mmol/L   Potassium 4.4 3.5 - 5.3 mmol/L   Chloride 103 98 - 110 mmol/L   CO2 29 20 - 31 mmol/L   Glucose, Bld 82 65 - 99 mg/dL   BUN 11 7 - 25 mg/dL   Creat 0.76 0.50 - 0.99 mg/dL   Total Bilirubin 0.6 0.2 - 1.2 mg/dL   Alkaline Phosphatase 65 33 - 130 U/L   AST 13 10 - 35 U/L   ALT 8 6 - 29 U/L   Total Protein 6.5 6.1 - 8.1 g/dL   Albumin 3.4 (L) 3.6 - 5.1 g/dL   Calcium 8.8 8.6 - 10.4 mg/dL  Lipase   Collection Time: 08/23/15  3:17 PM  Result Value Ref Range   Lipase 11 7 - 60 U/L  Results for orders placed or performed during the hospital encounter of 05/19/15 (from the past 8736 hour(s))  Surgical pcr screen   Collection Time: 05/19/15  2:15 PM  Result Value Ref Range   MRSA, PCR NEGATIVE NEGATIVE   Staphylococcus aureus NEGATIVE NEGATIVE  CBC with Differential/Platelet   Collection Time: 05/19/15  2:15 PM  Result Value Ref Range  WBC 6.7 4.0 - 10.5 K/uL   RBC 4.67 3.87 - 5.11 MIL/uL   Hemoglobin 12.7 12.0 - 15.0 g/dL   HCT 39.2 36.0 - 46.0 %   MCV 83.9 78.0 - 100.0 fL   MCH 27.2 26.0 - 34.0 pg   MCHC 32.4 30.0 - 36.0 g/dL   RDW 12.7 11.5 - 15.5 %   Platelets 183  150 - 400 K/uL   Neutrophils Relative % 41 (L) 43 - 77 %   Neutro Abs 2.8 1.7 - 7.7 K/uL   Lymphocytes Relative 48 (H) 12 - 46 %   Lymphs Abs 3.2 0.7 - 4.0 K/uL   Monocytes Relative 10 3 - 12 %   Monocytes Absolute 0.7 0.1 - 1.0 K/uL   Eosinophils Relative 1 0 - 5 %   Eosinophils Absolute 0.0 0.0 - 0.7 K/uL   Basophils Relative 0 0 - 1 %   Basophils Absolute 0.0 0.0 - 0.1 K/uL  Basic metabolic panel   Collection Time: 05/19/15  2:15 PM  Result Value Ref Range   Sodium 142 135 - 145 mmol/L   Potassium 3.6 3.5 - 5.1 mmol/L   Chloride 105 101 - 111 mmol/L   CO2 30 22 - 32 mmol/L   Glucose, Bld 82 65 - 99 mg/dL   BUN 18 6 - 20 mg/dL   Creatinine, Ser 0.92 0.44 - 1.00 mg/dL   Calcium 8.7 (L) 8.9 - 10.3 mg/dL   GFR calc non Af Amer >60 >60 mL/min   GFR calc Af Amer >60 >60 mL/min   Anion gap 7 5 - 15   Assessment Axis I bipolar disorder NOS, benzodiazepine abuse, alcohol abuse Axis II deferred Axis III see medical history Axis IV moderate Axis V 60-65  Plan/Discussion: She'll continue trazodone 100 mg daily at bedtime, continue Xanax for anxiety  0.5 mg in the morning and 1 mg at bedtime and Depakote for mood disorder She'll return to see me in 3 months Time spent 15 minutes.  More than 50% of the time spent in psychoeducation, counseling and coordination of care.  Discuss safety plan that anytime having active suicidal thoughts or homicidal thoughts then patient need to call 911 or go to the local emergency room.  MEDICATIONS this encounter: Meds ordered this encounter  Medications  . divalproex (DEPAKOTE) 250 MG DR tablet    Sig: Take 3 tablets (750 mg total) by mouth at bedtime.    Dispense:  90 tablet    Refill:  2  . traZODone (DESYREL) 100 MG tablet    Sig: Take 1 tablet (100 mg total) by mouth at bedtime.    Dispense:  30 tablet    Refill:  2  . ALPRAZolam (XANAX) 0.5 MG tablet    Sig: Take one in the am and two at bedtime    Dispense:  90 tablet    Refill:  2     Medical Decision Making Problem Points:  Established problem, stable/improving (1), Established problem, worsening (2), New problem, with no additional work-up planned (3), Review of last therapy session (1) and Review of psycho-social stressors (1) Data Points:  Review or order clinical lab tests (1) Review and summation of old records (2) Review of medication regiment & side effects (2) Review of new medications or change in dosage (2)  Tyann Niehaus, MD

## 2015-10-25 ENCOUNTER — Telehealth: Payer: Self-pay

## 2015-10-25 NOTE — Telephone Encounter (Signed)
Patient called requesting percocet 7.5/325 take 1 every 6 hours as needed.

## 2015-10-27 ENCOUNTER — Other Ambulatory Visit: Payer: Self-pay | Admitting: *Deleted

## 2015-10-27 DIAGNOSIS — M1711 Unilateral primary osteoarthritis, right knee: Secondary | ICD-10-CM

## 2015-10-27 MED ORDER — OXYCODONE-ACETAMINOPHEN 7.5-325 MG PO TABS: 1.0000 | ORAL_TABLET | Freq: Four times a day (QID) | ORAL | Status: DC | PRN

## 2015-10-27 NOTE — Telephone Encounter (Signed)
refill 

## 2015-10-27 NOTE — Telephone Encounter (Signed)
Patient's referral appointment has been scheduled at [facility] with Dr. Odette Fraction, Pain management specialist, 11/09/15, 2:45pm, per Rosey Bath; patient is aware.

## 2015-10-27 NOTE — Telephone Encounter (Signed)
Wean.

## 2015-10-27 NOTE — Telephone Encounter (Signed)
Prescription available, called patient, no answer 

## 2015-11-09 DIAGNOSIS — M5442 Lumbago with sciatica, left side: Secondary | ICD-10-CM | POA: Diagnosis not present

## 2015-11-09 DIAGNOSIS — G8929 Other chronic pain: Secondary | ICD-10-CM | POA: Diagnosis not present

## 2015-11-10 ENCOUNTER — Ambulatory Visit (INDEPENDENT_AMBULATORY_CARE_PROVIDER_SITE_OTHER): Payer: PPO | Admitting: Nurse Practitioner

## 2015-11-10 ENCOUNTER — Encounter: Payer: Self-pay | Admitting: Nurse Practitioner

## 2015-11-10 VITALS — BP 129/72 | HR 83 | Temp 98.5°F | Ht 63.0 in | Wt 230.2 lb

## 2015-11-10 DIAGNOSIS — K219 Gastro-esophageal reflux disease without esophagitis: Secondary | ICD-10-CM | POA: Diagnosis not present

## 2015-11-10 DIAGNOSIS — K5900 Constipation, unspecified: Secondary | ICD-10-CM

## 2015-11-10 NOTE — Progress Notes (Signed)
Referring Provider: Elfredia Nevins, MD Primary Care Physician:  Cassell Smiles., MD Primary GI:  Dr. Jena Gauss  Chief Complaint  Patient presents with  . Follow-up    HPI:   Diane [name] is a 65 y.o. female who presents for follow-up on constipation, GERD. At last office visit she had doubled her dose of Linzess to 145 micrograms twice a day in addition to MiraLAX twice a day and still only having a bowel movement every 2-3 days. She is on narcotics and she was subsequent changed from Linzess to Amitiza 24 g twice a day on a full stomach. GERD symptoms with breakthrough despite twice a day Prilosec dosing with breakthrough to 3 times a week. She was changed to Protonix 40 mg once a day. Colonoscopy and endoscopy both up-to-date completed on 05/20/2015. EGD impression was normal status post Maloney dilation. Colonoscopy impression with pancolonic diverticulosis, redundant colon, recommend repeat in 5 years.  Today she states her GERD is better on Protonix. Constipation is no better. Is taking Amitiza 24 mcg once daily and Miralax bid. Has a bowel movement every 3-4 days, stools are soft/formed, requires straining. Denies hematochezia, N/V. Abdominal pain mid anterior abdomen which improves after a bowel movement. Denies chest pain, dyspnea, dizziness, lightheadedness, syncope, near syncope. Denies any other upper or lower GI symptoms.  Past Medical History  Diagnosis Date  . Hypertension   . Allergic rhinitis   . Anxiety   . Depression   . GERD (gastroesophageal reflux disease)   . High cholesterol   . Hypothyroid   . Chronic back pain   . Arthritis   . Immature cataract   . Sleep apnea     STOP BANG score=4  . Seizures (HCC) 20 yrs ago    unknown etiology-on meds  . Headaches, cluster 3 yrs ago    last one  . Mania (HCC)   . 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. PTSD (post-traumatic stress disorder)     Past Surgical History  Procedure Laterality Date  . Left foot  x 2  .  Foot surgery      left foot-  . Partial hysterectomy    . Oophoectomy  bilateral  . Cholecystectomy    . Lumbar disc surgery  L4-5  . Back surgery    . Knee arthroscopy  01/2012    right  . Total knee arthroplasty  07/01/2012    Procedure: TOTAL KNEE ARTHROPLASTY;  Surgeon: Vickki Hearing, MD;  Location: [facility];  Service: Orthopedics;  Laterality: Right;  . Total knee revision Right 01/03/2013    Procedure: Patellaplasty Right Knee;  Surgeon: Vickki Hearing, MD;  Location: [facility];  Service: Orthopedics;  Laterality: Right;  . Knee arthroscopy with lateral release Right 01/03/2013    Procedure: Lateral Release Patella Right Knee;  Surgeon: Vickki Hearing, MD;  Location: [facility];  Service: Orthopedics;  Laterality: Right;  . Incision and drainage Right 01/03/2013    Procedure: INCISION AND DRAINAGE;  Surgeon: Vickki Hearing, MD;  Location: [facility];  Service: Orthopedics;  Laterality: Right;  . Esophagogastroduodenoscopy  10/16/2008    ZOX:WRUEA hiatal hernia otherwise normal esophagus, stomach  D1, D2, status post passage of a 56-French Maloney dilator  . Colonoscopy  10/16/2008    VWU:[id number], otherwise normal rectum; pancolonic diverticula the remainder of the colonic mucosa appeared normal. Next TCS due 10/2013.  Marland Kitchen Esophagogastroduodenoscopy  06/28/2006    FAO:ZHYQMV esophagus. Small hiatal hernia. Otherwise normal stomach, D1 and  D2, status post passage of a 43 [place] Maloney dilator  . Colonoscopy  06/28/2006    RMR: Internal hemorrhoids. Diminutive rectal polyps, cold biopsied/ Pancolonic diverticula. Polyps in the right colon removed   . Esophagogastroduodenoscopy (egd) with esophageal dilation N/A 07/14/2013    RMR: Abnormal esophagus of uncertain significance-status post esophageal biospy. small hiatal hernia.   . Esophagogastroduodenoscopy (egd) with propofol N/A 05/20/2015    RMR: NORMAL EGD status post maloney dilation   . Maloney dilation N/A 05/20/2015    Procedure:  Elease Hashimoto DILATION;  Surgeon: Corbin Ade, MD;  Location: [facility];  Service: Endoscopy;  Laterality: N/A;  Maloney dilator # 54  . Colonoscopy with propofol N/A 05/20/2015    RMR: Pancolonic diverticulosis. Redundant colon     Current Outpatient Prescriptions  Medication Sig Dispense Refill  . ALPRAZolam (XANAX) 0.5 MG tablet Take one in the am and two at bedtime 90 tablet 2  . amLODipine (NORVASC) 5 MG tablet Take 5 mg by mouth daily.    . cyclobenzaprine (FLEXERIL) 10 MG tablet Take 1 tablet (10 mg total) by mouth 3 (three) times daily. For spasms 60 tablet 5  . divalproex (DEPAKOTE) 250 MG DR tablet Take 3 tablets (750 mg total) by mouth at bedtime. 90 tablet 2  . furosemide (LASIX) 20 MG tablet Take 20 mg by mouth daily.    Marland Kitchen levothyroxine (LEVOTHROID) 150 MCG tablet Take 175 mcg by mouth daily.     Marland Kitchen LINZESS 145 MCG CAPS capsule TAKE ONE CAPSULE BY MOUTH DAILY 30 capsule 5  . omeprazole (PRILOSEC) 20 MG capsule Take 1 capsule (20 mg total) by mouth 2 (two) times daily before a meal. 30 capsule 3  . oxybutynin (DITROPAN-XL) 5 MG 24 hr tablet Take 5 mg by mouth daily.  0  . oxyCODONE-acetaminophen (PERCOCET) 7.5-325 MG tablet Take 1 tablet by mouth every 6 (six) hours as needed. 120 tablet 0  . pantoprazole (PROTONIX) 40 MG tablet Take 1 tablet (40 mg total) by mouth daily. 90 tablet 3  . polyethylene glycol-electrolytes (NULYTELY/GOLYTELY) 420 G solution Take 4,000 mLs by mouth once. 4000 mL 0  . rosuvastatin (CRESTOR) 40 MG tablet Take 40 mg by mouth daily.      . traZODone (DESYREL) 100 MG tablet Take 1 tablet (100 mg total) by mouth at bedtime. 30 tablet 2  . clindamycin (CLEOCIN) 300 MG capsule Two caps by mouth prior to dental procedure (Patient not taking: Reported on 11/10/2015) 2 capsule 5  . clindamycin (CLEOCIN) 300 MG capsule Two caps by mouth prior to dental treatment (Patient not taking: Reported on 11/10/2015) 2 capsule 3   No current facility-administered medications for this  visit.    Allergies as of 11/10/2015 - Review Complete 11/10/2015  Allergen Reaction Noted  . Neomycin-bacitracin zn-polymyx Other (See Comments) 02/27/2008  . Penicillins Hives   . Sulfonamide derivatives Hives and Swelling 02/27/2008    Family History  Problem Relation Age of Onset  . Heart disease    . Diabetes    . Alcohol abuse    . Alcohol abuse Mother   . Anxiety disorder Mother   . Alcohol abuse Father   . Anxiety disorder Father   . Bipolar disorder Sister   . Alcohol abuse Brother   . Anxiety disorder Brother   . Drug abuse Brother   . Dementia Paternal Aunt   . Depression Neg Hx   . OCD Neg Hx   . Paranoid behavior Neg Hx   . Schizophrenia  Neg Hx   . Sexual abuse Neg Hx   . Physical abuse Neg Hx   . ADD / ADHD Grandchild   . ADD / ADHD Grandchild   . Drug abuse Brother   . Alcohol abuse Brother   . Anxiety disorder Brother   . Seizures Brother   . Alcohol abuse Brother   . Anxiety disorder Brother   . Seizures Brother   . Alcohol abuse Brother   . Anxiety disorder Brother   . Alcohol abuse Brother   . Anxiety disorder Brother   . Colon cancer Neg Hx     Social History   Social History  . Marital Status: Married    Spouse Name: N/A  . Number of Children: N/A  . Years of Education: 12th grade   Occupational History  . disabled    Social History Main Topics  . Smoking status: Former Smoker -- 0.25 packs/day for 5 years    Types: Cigarettes    Quit date: 06/26/2003  . Smokeless tobacco: Never Used  . Alcohol Use: Yes     Comment: occasionally  . Drug Use: No     Comment: has a past history of street drug use  . Sexual Activity: Yes    Birth Control/ Protection: Surgical   Other Topics Concern  . None   Social History Narrative    Review of Systems: General: Negative for anorexia, weight loss, fever, chills, fatigue, weakness. CV: Negative for chest pain, angina, palpitations, peripheral edema.  Respiratory: Negative for dyspnea at  rest, cough, sputum, wheezing.  GI: See history of present illness. Endo: Negative for unusual weight change.  Heme: Negative for bruising or bleeding.   Physical Exam: BP 129/72 mmHg  Pulse 83  Temp(Src) 98.5 F (36.9 C)  Ht 5\' 3"  (1.6 m)  Wt 230 lb 3.2 oz (104.418 kg)  BMI 40.79 kg/m2 General:   Alert and oriented. Pleasant and cooperative. Well-nourished and well-developed.  Head:  Normocephalic and atraumatic. Eyes:  Without icterus, sclera clear and conjunctiva pink.  Ears:  Normal auditory acuity. Cardiovascular:  S1, S2 present without murmurs appreciated. Extremities without clubbing or edema. Respiratory:  Clear to auscultation bilaterally. No wheezes, rales, or rhonchi. No distress.  Gastrointestinal:  +BS, soft, and non-distended. Mild generalized TTP. No HSM noted. No guarding or rebound. No masses appreciated.  Rectal:  Deferred  Musculoskalatal:  Symmetrical without gross deformities. Neurologic:  Alert and oriented x4;  grossly normal neurologically. Psych:  Alert and cooperative. Normal mood and affect.No excessive bruising noted.    11/10/2015 1:35 PM   Disclaimer: This note was dictated with voice recognition software. Similar sounding words can inadvertently be transcribed and may not be corrected upon review.

## 2015-11-10 NOTE — Assessment & Plan Note (Signed)
Symptoms improved on changed to Protonix. Recommend continued Protonix, follow-up in 4 weeks.

## 2015-11-10 NOTE — Patient Instructions (Signed)
1. After x-ray done when you're able to. 2. Keep taking Protonix as you have been. 3. Take Amitiza twice a day. We will provide samples last 2 weeks. 4. Call with a progress report in 2 weeks on whether the Amitiza is helping her constipation. 5. Return for follow-up in 4 weeks.

## 2015-11-10 NOTE — Assessment & Plan Note (Signed)
And to need constipation, bowel movement every 3-4 days with straining required. Stools are not overtly hard. She has not been taking Amitiza as recommended, only taking it once a day. At this point I will order a flat plate abdomen to check for excessive stool burden. Recommend she take Amitiza twice a day. We will provide samples to last 2 weeks. Recommend progress report in 2 weeks at which point we can send in a prescription or change to another agent. Follow-up in 4 weeks.

## 2015-11-10 NOTE — Progress Notes (Signed)
CC'ED TO PCP 

## 2015-11-18 ENCOUNTER — Ambulatory Visit (HOSPITAL_COMMUNITY)
Admission: RE | Admit: 2015-11-18 | Discharge: 2015-11-18 | Disposition: A | Discharge status: Home / Self Care | Payer: PPO | Source: Ambulatory Visit | Attending: Nurse Practitioner | Admitting: Nurse Practitioner

## 2015-11-18 DIAGNOSIS — K219 Gastro-esophageal reflux disease without esophagitis: Secondary | ICD-10-CM | POA: Diagnosis not present

## 2015-11-18 DIAGNOSIS — K5900 Constipation, unspecified: Secondary | ICD-10-CM | POA: Insufficient documentation

## 2015-11-22 ENCOUNTER — Other Ambulatory Visit: Payer: Self-pay | Admitting: *Deleted

## 2015-11-22 ENCOUNTER — Telehealth: Payer: Self-pay | Admitting: Orthopedic Surgery

## 2015-11-22 DIAGNOSIS — M1711 Unilateral primary osteoarthritis, right knee: Secondary | ICD-10-CM

## 2015-11-22 MED ORDER — OXYCODONE-ACETAMINOPHEN 7.5-325 MG PO TABS: 1.0000 | ORAL_TABLET | Freq: Four times a day (QID) | ORAL | Status: DC | PRN

## 2015-11-22 NOTE — Telephone Encounter (Signed)
Patient aware prescription available for pick up 

## 2015-11-22 NOTE — Telephone Encounter (Signed)
Patient requesting refill on Oxycodone 7.5/325mg  Qty 120 Tablets  1 every 6 hours

## 2015-11-23 DIAGNOSIS — M5442 Lumbago with sciatica, left side: Secondary | ICD-10-CM | POA: Diagnosis not present

## 2015-11-30 ENCOUNTER — Encounter: Payer: Self-pay | Admitting: Orthopedic Surgery

## 2015-12-01 ENCOUNTER — Ambulatory Visit: Payer: PPO | Attending: Nephrology | Admitting: Sleep Medicine

## 2015-12-01 DIAGNOSIS — G4730 Sleep apnea, unspecified: Secondary | ICD-10-CM

## 2015-12-01 DIAGNOSIS — G4733 Obstructive sleep apnea (adult) (pediatric): Secondary | ICD-10-CM | POA: Diagnosis not present

## 2015-12-07 DIAGNOSIS — R809 Proteinuria, unspecified: Secondary | ICD-10-CM | POA: Diagnosis not present

## 2015-12-07 DIAGNOSIS — H2512 Age-related nuclear cataract, left eye: Secondary | ICD-10-CM | POA: Diagnosis not present

## 2015-12-07 DIAGNOSIS — Z79899 Other long term (current) drug therapy: Secondary | ICD-10-CM | POA: Diagnosis not present

## 2015-12-07 DIAGNOSIS — I10 Essential (primary) hypertension: Secondary | ICD-10-CM | POA: Diagnosis not present

## 2015-12-07 DIAGNOSIS — E559 Vitamin D deficiency, unspecified: Secondary | ICD-10-CM | POA: Diagnosis not present

## 2015-12-07 DIAGNOSIS — H25012 Cortical age-related cataract, left eye: Secondary | ICD-10-CM | POA: Diagnosis not present

## 2015-12-07 DIAGNOSIS — H524 Presbyopia: Secondary | ICD-10-CM | POA: Diagnosis not present

## 2015-12-07 DIAGNOSIS — H2513 Age-related nuclear cataract, bilateral: Secondary | ICD-10-CM | POA: Diagnosis not present

## 2015-12-07 DIAGNOSIS — D509 Iron deficiency anemia, unspecified: Secondary | ICD-10-CM | POA: Diagnosis not present

## 2015-12-07 DIAGNOSIS — H52203 Unspecified astigmatism, bilateral: Secondary | ICD-10-CM | POA: Diagnosis not present

## 2015-12-07 DIAGNOSIS — H25013 Cortical age-related cataract, bilateral: Secondary | ICD-10-CM | POA: Diagnosis not present

## 2015-12-07 DIAGNOSIS — N183 Chronic kidney disease, stage 3 (moderate): Secondary | ICD-10-CM | POA: Diagnosis not present

## 2015-12-09 NOTE — Sleep Study (Signed)
[facility] Tremell Reimers A. Gerilyn Pilgrim, MD     [email or web address]             NOCTURNAL POLYSOMNOGRAPHY   LOCATION: [facility]  Patient Name: Diane [name], Diane [name] Date: 12/01/2015 Gender: Female D.O.B: [birth year] Age (years): 15 Referring Provider: Not Available Height (inches): 63 Interpreting Physician: Beryle Beams MD, ABSM Weight (lbs): 225 RPSGT: Peak, Robert BMI: 40 MRN: [id number] Neck Size: 15.00 CLINICAL INFORMATION Sleep Study Type: NPSG Indication for sleep study: N/A Epworth Sleepiness Score: 7 SLEEP STUDY TECHNIQUE As per the AASM Manual for the Scoring of Sleep and Associated Events v2.3 (April 2016) with a hypopnea requiring 4% desaturations. The channels recorded and monitored were frontal, central and occipital EEG, electrooculogram (EOG), submentalis EMG (chin), nasal and oral airflow, thoracic and abdominal wall motion, anterior tibialis EMG, snore microphone, electrocardiogram, and pulse oximetry. MEDICATIONS Patient's medications include: N/A. Medications self-administered by patient during sleep study : No sleep medicine administered.  Current outpatient prescriptions:  .  ALPRAZolam (XANAX) 0.5 MG tablet, Take one in the am and two at bedtime, Disp: 90 tablet, Rfl: 2 .  amLODipine (NORVASC) 5 MG tablet, Take 5 mg by mouth daily., Disp: , Rfl:  .  cyclobenzaprine (FLEXERIL) 10 MG tablet, Take 1 tablet (10 mg total) by mouth 3 (three) times daily. For spasms, Disp: 60 tablet, Rfl: 5 .  divalproex (DEPAKOTE) 250 MG DR tablet, Take 3 tablets (750 mg total) by mouth at bedtime., Disp: 90 tablet, Rfl: 2 .  furosemide (LASIX) 20 MG tablet, Take 20 mg by mouth daily., Disp: , Rfl:  .  levothyroxine (LEVOTHROID) 150 MCG tablet, Take 175 mcg by mouth daily. , Disp: , Rfl:  .  omeprazole (PRILOSEC) 20 MG capsule, Take 1 capsule (20 mg total) by mouth 2 (two) times daily before a meal., Disp: 30 capsule, Rfl: 3 .  oxyCODONE-acetaminophen (PERCOCET) 7.5-325 MG  tablet, Take 1 tablet by mouth every 6 (six) hours as needed., Disp: 120 tablet, Rfl: 0 .  pantoprazole (PROTONIX) 40 MG tablet, Take 1 tablet (40 mg total) by mouth daily., Disp: 90 tablet, Rfl: 3 .  polyethylene glycol-electrolytes (NULYTELY/GOLYTELY) 420 G solution, Take 4,000 mLs by mouth once., Disp: 4000 mL, Rfl: 0 .  rosuvastatin (CRESTOR) 40 MG tablet, Take 40 mg by mouth daily.  , Disp: , Rfl:  .  traZODone (DESYREL) 100 MG tablet, Take 1 tablet (100 mg total) by mouth at bedtime., Disp: 30 tablet, Rfl: 2  SLEEP ARCHITECTURE The study was initiated at 10:34:37 PM and ended at 4:58:43 AM. Sleep onset time was 26.6 minutes and the sleep efficiency was 72.3%. The total sleep time was 277.5 minutes. Stage REM latency was 295.0 minutes. The patient spent 29.73% of the night in stage N1 sleep, 67.21% in stage N2 sleep, 0.00% in stage N3 and 3.06% in REM. Alpha intrusion was absent. Supine sleep was 42.70%. RESPIRATORY PARAMETERS The overall apnea/hypopnea index (AHI) was 17.1 per hour. There were 17 total apneas, including 17 obstructive, 0 central and 0 mixed apneas. There were 62 hypopneas and 8 RERAs. The AHI during Stage REM sleep was 35.3 per hour. AHI while supine was 5.6 per hour. The mean oxygen saturation was 85.10%. The minimum SpO2 during sleep was 65.00%. Soft snoring was noted during this study.  There were areas of prolonged oxygen desaturation in the mid 80s without associated obstructive or central apneic events. CARDIAC DATA The 2 lead EKG demonstrated sinus rhythm. The mean heart rate was 75.62 beats per minute. Other EKG  findings include: PVCs. LEG MOVEMENT DATA The total PLMS were 0 with a resulting PLMS index of 0.00. Associated arousal with leg movement index was 0.0.   IMPRESSIONS - Moderate obstructive sleep apnea. - Hypoventilation syndrome with severe oxygen desaturation was noted during this study. - Formal CPAP titration study is recommended. The patient  will also likely need supplemental nocturnal oxygen.  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   Argie RammingKofi A Lilton Pare, MD Diplomate, American Board of Sleep Medicine.

## 2015-12-14 ENCOUNTER — Encounter: Payer: Self-pay | Admitting: Nurse Practitioner

## 2015-12-14 ENCOUNTER — Ambulatory Visit (INDEPENDENT_AMBULATORY_CARE_PROVIDER_SITE_OTHER): Payer: PPO | Admitting: Nurse Practitioner

## 2015-12-14 VITALS — BP 132/73 | HR 70 | Temp 98.6°F | Ht 63.0 in | Wt 220.2 lb

## 2015-12-14 DIAGNOSIS — K5900 Constipation, unspecified: Secondary | ICD-10-CM | POA: Diagnosis not present

## 2015-12-14 DIAGNOSIS — K219 Gastro-esophageal reflux disease without esophagitis: Secondary | ICD-10-CM

## 2015-12-14 MED ORDER — PANTOPRAZOLE SODIUM 40 MG PO TBEC: 40.0000 mg | DELAYED_RELEASE_TABLET | Freq: Every day | ORAL | Status: DC

## 2015-12-14 MED ORDER — LUBIPROSTONE 24 MCG PO CAPS: 24.0000 ug | ORAL_CAPSULE | Freq: Two times a day (BID) | ORAL | Status: DC

## 2015-12-14 NOTE — Progress Notes (Signed)
Referring Provider: Elfredia Nevins, MD Primary Care Physician:  Cassell Smiles., MD Primary GI:  Dr. Jena Gauss  Chief Complaint  Patient presents with  . Constipation  . Gastroesophageal Reflux    HPI:   Diane [name] is a 65 y.o. female who presents for follow-up on GERD and constipation. She was last seen in our office 11/10/2015 for the same. At that time she noted her GERD symptoms were improved on Protonix but constipation no better, taking MiraLAX and Amitiza 24 g, but was only taking once a day rather than twice a day as instructed. At that time she was having a bowel movement every 3-4 days with straining. Flat plate abdomen ordered and take Amitiza as instructed, twice a day. Abdominal x-ray completed 11/18/2015 which found moderate amount of feces throughout the colon without obstruction. She was requested to call us with a  progress report, no phone note noted in our system.  Today she states constipation is improved with bid dosing of Amitiza. Has a bowel movement twice a day, feels like she completely empties her bowels. Has also changed her diet to include more fresh fruits and vegetables. GERD symptoms continues to be improved on Protonix, rare breakthrough symptoms which are typically provoked by acidic foods, especially tomato products, and eating too late. Denies abdominal pain, N/V, hematochezia, melena. Denies any other upper or lower GI symptoms.   Past Medical History  Diagnosis Date  . Hypertension   . Allergic rhinitis   . Anxiety   . Depression   . GERD (gastroesophageal reflux disease)   . High cholesterol   . Hypothyroid   . Chronic back pain     Sees [facility]  . Arthritis   . Immature cataract   . Sleep apnea     STOP BANG score=4  . Seizures (HCC) 20 yrs ago    unknown etiology-on meds  . Headaches, cluster 3 yrs ago    last one  . Mania (HCC)   . 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. PTSD (post-traumatic stress disorder)   .  Cataracts, bilateral     Past Surgical History  Procedure Laterality Date  . Left foot  x 2  . Foot surgery      left foot-  . Partial hysterectomy    . Oophoectomy  bilateral  . Cholecystectomy    . Lumbar disc surgery  L4-5  . Back surgery    . Knee arthroscopy  01/2012    right  . Total knee arthroplasty  07/01/2012    Procedure: TOTAL KNEE ARTHROPLASTY;  Surgeon: Vickki Hearing, MD;  Location: [facility];  Service: Orthopedics;  Laterality: Right;  . Total knee revision Right 01/03/2013    Procedure: Patellaplasty Right Knee;  Surgeon: Vickki Hearing, MD;  Location: [facility];  Service: Orthopedics;  Laterality: Right;  . Knee arthroscopy with lateral release Right 01/03/2013    Procedure: Lateral Release Patella Right Knee;  Surgeon: Vickki Hearing, MD;  Location: [facility];  Service: Orthopedics;  Laterality: Right;  . Incision and drainage Right 01/03/2013    Procedure: INCISION AND DRAINAGE;  Surgeon: Vickki Hearing, MD;  Location: [facility];  Service: Orthopedics;  Laterality: Right;  . Esophagogastroduodenoscopy  10/16/2008    ZOX:WRUEA hiatal hernia otherwise normal esophagus, stomach  D1, D2, status post passage of a 56-French Maloney dilator  . Colonoscopy  10/16/2008    VWU:[id number], otherwise normal rectum; pancolonic diverticula the remainder of the colonic mucosa appeared  normal. Next TCS due 10/2013.  Marland Kitchen Esophagogastroduodenoscopy  06/28/2006    ZOX:WRUEAV esophagus. Small hiatal hernia. Otherwise normal stomach, D1 and D2, status post passage of a 56 [place] Maloney dilator  . Colonoscopy  06/28/2006    RMR: Internal hemorrhoids. Diminutive rectal polyps, cold biopsied/ Pancolonic diverticula. Polyps in the right colon removed   . Esophagogastroduodenoscopy (egd) with esophageal dilation N/A 07/14/2013    RMR: Abnormal esophagus of uncertain significance-status post esophageal biospy. small hiatal hernia.   . Esophagogastroduodenoscopy (egd) with propofol N/A  05/20/2015    RMR: NORMAL EGD status post maloney dilation   . Maloney dilation N/A 05/20/2015    Procedure: Elease Hashimoto DILATION;  Surgeon: Corbin Ade, MD;  Location: [facility];  Service: Endoscopy;  Laterality: N/A;  Maloney dilator # 54  . Colonoscopy with propofol N/A 05/20/2015    RMR: Pancolonic diverticulosis. Redundant colon     Current Outpatient Prescriptions  Medication Sig Dispense Refill  . ALPRAZolam (XANAX) 0.5 MG tablet Take one in the am and two at bedtime 90 tablet 2  . amLODipine (NORVASC) 5 MG tablet Take 5 mg by mouth daily.    . cyclobenzaprine (FLEXERIL) 10 MG tablet Take 1 tablet (10 mg total) by mouth 3 (three) times daily. For spasms 60 tablet 5  . divalproex (DEPAKOTE) 250 MG DR tablet Take 3 tablets (750 mg total) by mouth at bedtime. 90 tablet 2  . furosemide (LASIX) 20 MG tablet Take 20 mg by mouth daily.    Marland Kitchen levothyroxine (LEVOTHROID) 150 MCG tablet Take 175 mcg by mouth daily.     Marland Kitchen lubiprostone (AMITIZA) 24 MCG capsule Take 24 mcg by mouth 2 (two) times daily with a meal.    . oxyCODONE-acetaminophen (PERCOCET) 7.5-325 MG tablet Take 1 tablet by mouth every 6 (six) hours as needed. 120 tablet 0  . pantoprazole (PROTONIX) 40 MG tablet Take 1 tablet (40 mg total) by mouth daily. 90 tablet 3  . rosuvastatin (CRESTOR) 40 MG tablet Take 40 mg by mouth daily.      . traZODone (DESYREL) 100 MG tablet Take 1 tablet (100 mg total) by mouth at bedtime. 30 tablet 2   No current facility-administered medications for this visit.    Allergies as of 12/14/2015 - Review Complete 12/14/2015  Allergen Reaction Noted  . Neomycin-bacitracin zn-polymyx Other (See Comments) 02/27/2008  . Penicillins Hives   . Sulfonamide derivatives Hives and Swelling 02/27/2008    Family History  Problem Relation Age of Onset  . Heart disease    . Diabetes    . Alcohol abuse    . Alcohol abuse Mother   . Anxiety disorder Mother   . Alcohol abuse Father   . Anxiety disorder Father   .  Bipolar disorder Sister   . Alcohol abuse Brother   . Anxiety disorder Brother   . Drug abuse Brother   . Dementia Paternal Aunt   . Depression Neg Hx   . OCD Neg Hx   . Paranoid behavior Neg Hx   . Schizophrenia Neg Hx   . Sexual abuse Neg Hx   . Physical abuse Neg Hx   . ADD / ADHD Grandchild   . ADD / ADHD Grandchild   . Drug abuse Brother   . Alcohol abuse Brother   . Anxiety disorder Brother   . Seizures Brother   . Alcohol abuse Brother   . Anxiety disorder Brother   . Seizures Brother   . Alcohol abuse Brother   .  Anxiety disorder Brother   . Alcohol abuse Brother   . Anxiety disorder Brother   . Colon cancer Neg Hx     Social History   Social History  . Marital Status: Married    Spouse Name: N/A  . Number of Children: N/A  . Years of Education: 12th grade   Occupational History  . disabled    Social History Main Topics  . Smoking status: Former Smoker -- 0.25 packs/day for 5 years    Types: Cigarettes    Quit date: 06/26/2003  . Smokeless tobacco: Never Used  . Alcohol Use: 0.0 oz/week    0 Standard drinks or equivalent per week     Comment: occasionally; once a month.  . Drug Use: No     Comment: has a past history of street drug use  . Sexual Activity: Yes    Birth Control/ Protection: Surgical   Other Topics Concern  . None   Social History Narrative    Review of Systems: General: Negative for anorexia, weight loss, fever, chills, fatigue, weakness. ENT: Negative for hoarseness, difficulty swallowing. CV: Negative for chest pain, angina, palpitations, peripheral edema.  Respiratory: Negative for dyspnea at rest, cough, sputum, wheezing.  GI: See history of present illness. Endo: Negative for unusual weight change.    Physical Exam: BP 132/73 mmHg  Pulse 70  Temp(Src) 98.6 F (37 C) (Oral)  Ht 5\' 3"  (1.6 m)  Wt 220 lb 3.2 oz (99.882 kg)  BMI 39.02 kg/m2 General:   Alert and oriented. Pleasant and cooperative. Well-nourished and  well-developed.  Head:  Normocephalic and atraumatic. Cardiovascular:  S1, S2 present without murmurs appreciated. Extremities without clubbing or edema. Respiratory:  Clear to auscultation bilaterally. No wheezes, rales, or rhonchi. No distress.  Gastrointestinal:  +BS, soft, non-tender and non-distended. No guarding or rebound. Rectal:  Deferred  Musculoskalatal:  Symmetrical without gross deformities. Neurologic:  Alert and oriented x4;  grossly normal neurologically. Psych:  Alert and cooperative. Normal mood and affect. Heme/Lymph/Immune: No excessive bruising noted.    12/14/2015 1:57 PM   Disclaimer: This note was dictated with voice recognition software. Similar sounding words can inadvertently be transcribed and may not be corrected upon review.

## 2015-12-14 NOTE — Patient Instructions (Signed)
1. Continue taking Protonix and Amitiza as you have been 2. Return for follow-up in 6 months 3. If you have returning symptoms or worsening symptoms, call us and we can schedule a sooner appointment.

## 2015-12-14 NOTE — Progress Notes (Signed)
cc'ed to pcp °

## 2015-12-14 NOTE — Assessment & Plan Note (Signed)
Constipation much better control with Amitiza 24 g twice a day. Currently has 2-3 soft bowel movements a day. Continue to monitor, return for follow-up in 6 months.

## 2015-12-14 NOTE — Assessment & Plan Note (Signed)
Currently well-controlled on Protonix. Breakthrough symptoms minimally, depending on diet and time of eating. Continue to monitor. Return for follow-up in 6 months.

## 2015-12-15 DIAGNOSIS — R809 Proteinuria, unspecified: Secondary | ICD-10-CM | POA: Diagnosis not present

## 2015-12-15 DIAGNOSIS — D649 Anemia, unspecified: Secondary | ICD-10-CM | POA: Diagnosis not present

## 2015-12-15 DIAGNOSIS — I10 Essential (primary) hypertension: Secondary | ICD-10-CM | POA: Diagnosis not present

## 2015-12-15 DIAGNOSIS — N189 Chronic kidney disease, unspecified: Secondary | ICD-10-CM | POA: Diagnosis not present

## 2015-12-21 ENCOUNTER — Telehealth: Payer: Self-pay | Admitting: Orthopedic Surgery

## 2015-12-21 DIAGNOSIS — M1711 Unilateral primary osteoarthritis, right knee: Secondary | ICD-10-CM

## 2015-12-21 NOTE — Telephone Encounter (Signed)
Call from patient requesting refill on medication: oxyCODONE-acetaminophen (PERCOCET) 7.5-325 MG tablet [[id number]][[id number]] - quantity 120.  Relayed to patient that Dr Romeo AppleHarrison is out of office this week; therefore, Dr Hilda LiasKeeling is reviewing refill requests.  Patient's ph [phone]

## 2015-12-22 MED ORDER — OXYCODONE-ACETAMINOPHEN 7.5-325 MG PO TABS: 1.0000 | ORAL_TABLET | Freq: Four times a day (QID) | ORAL | Status: DC | PRN

## 2015-12-22 NOTE — Telephone Encounter (Signed)
Rx done. 

## 2015-12-30 NOTE — Patient Instructions (Addendum)
Your procedure is scheduled on: 01/04/2016  [facility] at  830 AM.  Call this number if you have problems the morning of surgery: [phone]   Do not eat food or drink liquids :After Midnight.      Take these medicines the morning of surgery with A SIP OF WATER: xanax, norvasc, flexaril, levothyroxine, oxycodone, protonix.   Do not wear jewelry, make-up or nail polish.  Do not wear lotions, powders, or perfumes. You may wear deodorant.  Do not shave 48 hours prior to surgery.  Do not bring valuables to the hospital.  Contacts, dentures or bridgework may not be worn into surgery.  Leave suitcase in the car. After surgery it may be brought to your room.  For patients admitted to the hospital, checkout time is 11:00 AM the day of discharge.   Patients discharged the day of surgery will not be allowed to drive home.  :     Please read over the following fact sheets that you were given: Coughing and Deep Breathing, Surgical Site Infection Prevention, Anesthesia Post-op Instructions and Care and Recovery After Surgery    Cataract A cataract is a clouding of the lens of the eye. When a lens becomes cloudy, vision is reduced based on the degree and nature of the clouding. Many cataracts reduce vision to some degree. Some cataracts make people more near-sighted as they develop. Other cataracts increase glare. Cataracts that are ignored and become worse can sometimes look white. The white color can be seen through the pupil. CAUSES   Aging. However, cataracts may occur at any age, even in newborns.   Certain drugs.   Trauma to the eye.   Certain diseases such as diabetes.   Specific eye diseases such as chronic inflammation inside the eye or a sudden attack of a rare form of glaucoma.   Inherited or acquired medical problems.  SYMPTOMS   Gradual, progressive drop in vision in the affected eye.   Severe, rapid visual loss. This most often happens when trauma is the cause.    DIAGNOSIS  To detect a cataract, an eye doctor examines the lens. Cataracts are best diagnosed with an exam of the eyes with the pupils enlarged (dilated) by drops.  TREATMENT  For an early cataract, vision may improve by using different eyeglasses or stronger lighting. If that does not help your vision, surgery is the only effective treatment. A cataract needs to be surgically removed when vision loss interferes with your everyday activities, such as driving, reading, or watching TV. A cataract may also have to be removed if it prevents examination or treatment of another eye problem. Surgery removes the cloudy lens and usually replaces it with a substitute lens (intraocular lens, IOL).  At a time when both you and your doctor agree, the cataract will be surgically removed. If you have cataracts in both eyes, only one is usually removed at a time. This allows the operated eye to heal and be out of danger from any possible problems after surgery (such as infection or poor wound healing). In rare cases, a cataract may be doing damage to your eye. In these cases, your caregiver may advise surgical removal right away. The vast majority of people who have cataract surgery have better vision afterward. HOME CARE INSTRUCTIONS  If you are not planning surgery, you may be asked to do the following:  Use different eyeglasses.   Use stronger or brighter lighting.   Ask your eye doctor about  reducing your medicine dose or changing medicines if it is thought that a medicine caused your cataract. Changing medicines does not make the cataract go away on its own.   Become familiar with your surroundings. Poor vision can lead to injury. Avoid bumping into things on the affected side. You are at a higher risk for tripping or falling.   Exercise extreme care when driving or operating machinery.   Wear sunglasses if you are sensitive to bright light or experiencing problems with glare.  SEEK IMMEDIATE MEDICAL CARE  IF:   You have a worsening or sudden vision loss.   You notice redness, swelling, or increasing pain in the eye.   You have a fever.  Document Released: 08/28/2005 Document Revised: 08/17/2011 Document Reviewed: 04/21/2011 [facility] Patient Information 2012 [place].PATIENT INSTRUCTIONS POST-ANESTHESIA  IMMEDIATELY FOLLOWING SURGERY:  Do not drive or operate machinery for the first twenty four hours after surgery.  Do not make any important decisions for twenty four hours after surgery or while taking narcotic pain medications or sedatives.  If you develop intractable nausea and vomiting or a severe headache please notify your doctor immediately.  FOLLOW-UP:  Please make an appointment with your surgeon as instructed. You do not need to follow up with anesthesia unless specifically instructed to do so.  WOUND CARE INSTRUCTIONS (if applicable):  Keep a dry clean dressing on the anesthesia/puncture wound site if there is drainage.  Once the wound has quit draining you may leave it open to air.  Generally you should leave the bandage intact for twenty four hours unless there is drainage.  If the epidural site drains for more than 36-48 hours please call the anesthesia department.  QUESTIONS?:  Please feel free to call your physician or the hospital operator if you have any questions, and they will be happy to assist you.

## 2015-12-31 ENCOUNTER — Encounter (HOSPITAL_COMMUNITY)
Admission: RE | Admit: 2015-12-31 | Discharge: 2015-12-31 | Disposition: A | Discharge status: Home / Self Care | Payer: PPO | Source: Ambulatory Visit | Attending: Ophthalmology | Admitting: Ophthalmology

## 2015-12-31 ENCOUNTER — Encounter (HOSPITAL_COMMUNITY): Payer: Self-pay

## 2015-12-31 DIAGNOSIS — Z01810 Encounter for preprocedural cardiovascular examination: Secondary | ICD-10-CM | POA: Diagnosis not present

## 2015-12-31 DIAGNOSIS — Z01812 Encounter for preprocedural laboratory examination: Secondary | ICD-10-CM | POA: Insufficient documentation

## 2015-12-31 LAB — BASIC METABOLIC PANEL
Anion gap: 9 (ref 5–15)
BUN: 14 mg/dL (ref 6–20)
CO2: 26 mmol/L (ref 22–32)
Calcium: 8.8 mg/dL — ABNORMAL LOW (ref 8.9–10.3)
Chloride: 107 mmol/L (ref 101–111)
Creatinine, Ser: 0.91 mg/dL (ref 0.44–1.00)
GFR calc Af Amer: >60 mL/min (ref >60)
GFR calc non Af Amer: >60 mL/min (ref >60)
Glucose, Bld: 105 mg/dL — ABNORMAL HIGH (ref 65–99)
Potassium: 3.9 mmol/L (ref 3.5–5.1)
Sodium: 142 mmol/L (ref 135–145)

## 2015-12-31 LAB — CBC
HCT: 38.6 % (ref 36.0–46.0)
Hemoglobin: 12.6 g/dL (ref 12.0–15.0)
MCH: 28.6 pg (ref 26.0–34.0)
MCHC: 32.6 g/dL (ref 30.0–36.0)
MCV: 87.5 fL (ref 78.0–100.0)
Platelets: 153 10*3/uL (ref 150–400)
RBC: 4.41 MIL/uL (ref 3.87–5.11)
RDW: 14.1 % (ref 11.5–15.5)
WBC: 4.7 10*3/uL (ref 4.0–10.5)

## 2015-12-31 LAB — SURGICAL PCR SCREEN
MRSA, PCR: NEGATIVE
Staphylococcus aureus: NEGATIVE

## 2015-12-31 NOTE — Pre-Procedure Instructions (Signed)
Patient given information to sign up for my chart at home. 

## 2016-01-04 ENCOUNTER — Ambulatory Visit (HOSPITAL_COMMUNITY): Payer: PPO | Admitting: Anesthesiology

## 2016-01-04 ENCOUNTER — Encounter (HOSPITAL_COMMUNITY)
Admission: RE | Disposition: A | Discharge status: Home / Self Care | Payer: Self-pay | Source: Ambulatory Visit | Attending: Ophthalmology

## 2016-01-04 ENCOUNTER — Encounter (HOSPITAL_COMMUNITY): Payer: Self-pay | Admitting: *Deleted

## 2016-01-04 ENCOUNTER — Ambulatory Visit (HOSPITAL_COMMUNITY)
Admission: RE | Admit: 2016-01-04 | Discharge: 2016-01-04 | Disposition: A | Discharge status: Home / Self Care | Payer: PPO | Source: Ambulatory Visit | Attending: Ophthalmology | Admitting: Ophthalmology

## 2016-01-04 DIAGNOSIS — F329 Major depressive disorder, single episode, unspecified: Secondary | ICD-10-CM | POA: Insufficient documentation

## 2016-01-04 DIAGNOSIS — F419 Anxiety disorder, unspecified: Secondary | ICD-10-CM | POA: Diagnosis not present

## 2016-01-04 DIAGNOSIS — Z7982 Long term (current) use of aspirin: Secondary | ICD-10-CM | POA: Insufficient documentation

## 2016-01-04 DIAGNOSIS — H268 Other specified cataract: Secondary | ICD-10-CM | POA: Insufficient documentation

## 2016-01-04 DIAGNOSIS — F319 Bipolar disorder, unspecified: Secondary | ICD-10-CM | POA: Insufficient documentation

## 2016-01-04 DIAGNOSIS — E039 Hypothyroidism, unspecified: Secondary | ICD-10-CM | POA: Insufficient documentation

## 2016-01-04 DIAGNOSIS — H269 Unspecified cataract: Secondary | ICD-10-CM | POA: Diagnosis not present

## 2016-01-04 DIAGNOSIS — H25011 Cortical age-related cataract, right eye: Secondary | ICD-10-CM | POA: Diagnosis not present

## 2016-01-04 DIAGNOSIS — K219 Gastro-esophageal reflux disease without esophagitis: Secondary | ICD-10-CM | POA: Insufficient documentation

## 2016-01-04 DIAGNOSIS — Z79899 Other long term (current) drug therapy: Secondary | ICD-10-CM | POA: Insufficient documentation

## 2016-01-04 DIAGNOSIS — H2512 Age-related nuclear cataract, left eye: Secondary | ICD-10-CM | POA: Diagnosis not present

## 2016-01-04 DIAGNOSIS — H2511 Age-related nuclear cataract, right eye: Secondary | ICD-10-CM | POA: Diagnosis not present

## 2016-01-04 DIAGNOSIS — H25012 Cortical age-related cataract, left eye: Secondary | ICD-10-CM | POA: Diagnosis not present

## 2016-01-04 HISTORY — PX: CATARACT EXTRACTION W/PHACO: SHX586

## 2016-01-04 SURGERY — PHACOEMULSIFICATION, CATARACT, WITH IOL INSERTION
Anesthesia: Monitor Anesthesia Care | Site: Eye | Laterality: Left

## 2016-01-04 MED ORDER — MIDAZOLAM HCL 2 MG/2ML IJ SOLN
INTRAMUSCULAR | Status: AC
  Filled 2016-01-04: qty 2

## 2016-01-04 MED ORDER — CYCLOPENTOLATE-PHENYLEPHRINE 0.2-1 % OP SOLN
1.0000 [drp] | OPHTHALMIC | Status: AC
  Administered 2016-01-04 (×3): 1 [drp] via OPHTHALMIC

## 2016-01-04 MED ORDER — BSS IO SOLN
INTRAOCULAR | Status: DC | PRN
  Administered 2016-01-04: 15 mL

## 2016-01-04 MED ORDER — EPINEPHRINE HCL 1 MG/ML IJ SOLN
INTRAMUSCULAR | Status: AC
  Filled 2016-01-04: qty 1

## 2016-01-04 MED ORDER — PHENYLEPHRINE HCL 2.5 % OP SOLN
1.0000 [drp] | OPHTHALMIC | Status: AC
  Administered 2016-01-04 (×3): 1 [drp] via OPHTHALMIC

## 2016-01-04 MED ORDER — LACTATED RINGERS IV SOLN
INTRAVENOUS | Status: DC
  Administered 2016-01-04: 10:00:00 via INTRAVENOUS

## 2016-01-04 MED ORDER — KETOROLAC TROMETHAMINE 0.5 % OP SOLN
1.0000 [drp] | OPHTHALMIC | Status: AC
  Administered 2016-01-04 (×3): 1 [drp] via OPHTHALMIC

## 2016-01-04 MED ORDER — MIDAZOLAM HCL 2 MG/2ML IJ SOLN
1.0000 mg | INTRAMUSCULAR | Status: DC | PRN
  Administered 2016-01-04: 2 mg via INTRAVENOUS

## 2016-01-04 MED ORDER — TETRACAINE 0.5 % OP SOLN OPTIME - NO CHARGE
OPHTHALMIC | Status: DC | PRN
  Administered 2016-01-04: 2 [drp] via OPHTHALMIC

## 2016-01-04 MED ORDER — TETRACAINE HCL 0.5 % OP SOLN
1.0000 [drp] | OPHTHALMIC | Status: AC
  Administered 2016-01-04 (×3): 1 [drp] via OPHTHALMIC

## 2016-01-04 MED ORDER — PROVISC 10 MG/ML IO SOLN
INTRAOCULAR | Status: DC | PRN
  Administered 2016-01-04: 0.85 mL via INTRAOCULAR

## 2016-01-04 MED ORDER — EPINEPHRINE HCL 1 MG/ML IJ SOLN
INTRAOCULAR | Status: DC | PRN
  Administered 2016-01-04: 500 mL

## 2016-01-04 SURGICAL SUPPLY — 9 items
CLOTH BEACON ORANGE TIMEOUT ST (SAFETY) ×2 IMPLANT
EYE SHIELD UNIVERSAL CLEAR (GAUZE/BANDAGES/DRESSINGS) ×2 IMPLANT
GLOVE BIO SURGEON STRL SZ 6.5 (GLOVE) ×2 IMPLANT
GLOVE EXAM NITRILE MD LF STRL (GLOVE) ×2 IMPLANT
LENS ALC ACRYL/TECN (Ophthalmic Related) ×2 IMPLANT
PAD ARMBOARD 7.5X6 YLW CONV (MISCELLANEOUS) ×2 IMPLANT
TAPE SURG TRANSPORE 1 IN (GAUZE/BANDAGES/DRESSINGS) ×1 IMPLANT
TAPE SURGICAL TRANSPORE 1 IN (GAUZE/BANDAGES/DRESSINGS) ×1
WATER STERILE IRR 250ML POUR (IV SOLUTION) ×2 IMPLANT

## 2016-01-04 NOTE — Anesthesia Preprocedure Evaluation (Signed)
Anesthesia Evaluation  Patient identified by MRN, date of birth, ID band Patient awake    Reviewed: Allergy & Precautions, NPO status , Patient's Chart, lab work & pertinent test results  Airway Mallampati: III  TM Distance: >3 FB   Mouth opening: Limited Mouth Opening  Dental  (+) Edentulous Upper, Upper Dentures, Poor Dentition, Missing,    Pulmonary sleep apnea , former smoker,     + decreased breath sounds      Cardiovascular hypertension, Pt. on medications Normal cardiovascular exam     Neuro/Psych Anxiety Depression Bipolar Disorder    GI/Hepatic GERD  Medicated and Controlled,  Endo/Other  Hypothyroidism Morbid obesity  Renal/GU      Musculoskeletal  (+) Arthritis , Osteoarthritis,    Abdominal (+) + obese,   Peds  Hematology   Anesthesia Other Findings   Reproductive/Obstetrics                             Anesthesia Physical Anesthesia Plan  ASA: III  Anesthesia Plan: MAC   Post-op Pain Management:    Induction: Intravenous  Airway Management Planned: Nasal Cannula  Additional Equipment:   Intra-op Plan:   Post-operative Plan:   Informed Consent: I have reviewed the patients History and Physical, chart, labs and discussed the procedure including the risks, benefits and alternatives for the proposed anesthesia with the patient or authorized representative who has indicated his/her understanding and acceptance.   Dental advisory given  Plan Discussed with: CRNA  Anesthesia Plan Comments:         Anesthesia Quick Evaluation

## 2016-01-04 NOTE — Op Note (Signed)
Patient brought to the operating room and prepped and draped in the usual manner.  Lid speculum inserted in left eye.  Stab incision made at the twelve o'clock position.  Provisc instilled in the anterior chamber.   A 2.4 mm. Stab incision was made temporally.  An anterior capsulotomy was done with a bent 25 gauge needle.  The nucleus was hydrodissected.  The Phaco tip was inserted in the anterior chamber and the nucleus was emulsified.  CDE was 4.70.  The cortical material was then removed with the I and A tip.  Posterior capsule was the polished.  The anterior chamber was deepened with Provisc.  A 21.0 Diopter Hoya Model 250 IOL was then inserted in the capsular bag.  Provisc was then removed with the I and A tip.  The wound was then hydrated.  Patient sent to the Recovery Room in good condition with follow up in my office.  Preoperative Diagnosis:  Nuclear Cataract OS Postoperative Diagnosis:  Same Procedure name: Kelman Phacoemulsification OS with IOL

## 2016-01-04 NOTE — Transfer of Care (Signed)
Immediate Anesthesia Transfer of Care Note  Patient: Diane [name]  Procedure(s) Performed: Procedure(s) with comments: CATARACT EXTRACTION PHACO AND INTRAOCULAR LENS PLACEMENT (IOC) (Left) - CDE:4.70  Patient Location: Short Stay  Anesthesia Type:MAC  Level of Consciousness: awake  Airway & Oxygen Therapy: Patient Spontanous Breathing  Post-op Assessment: Report given to RN  Post vital signs: Reviewed  Last Vitals:  Filed Vitals:   01/04/16 1000 01/04/16 1001  BP: 100/56 100/56  Pulse:    Temp:    Resp: 15 15    Complications: No apparent anesthesia complications

## 2016-01-04 NOTE — Discharge Instructions (Signed)
°  °          [facility] Instructions °[address]- [place] [address] °    ° °1. Avoid closing eyes tightly. One often closes the eye tightly when laughing, talking, sneezing, coughing or if they feel irritated. At these times, you should be careful not to close your eyes tightly. ° °2. Instill eye drops as instructed. To instill drops in your eye, open it, look up and have someone gently pull the lower lid down and instill a couple of drops inside the lower lid. ° °3. Do not touch upper lid. ° °4. Take Advil or Tylenol for pain. ° °5. You may use either eye for near work, such as reading or sewing and you may watch television. ° °6. You may have your hair done at the beauty parlor at any time. ° °7. Wear dark glasses with or without your own glasses if you are in bright light. ° °8. Call our office at [phone] or [phone] if you have sharp pain in your eye or unusual symptoms. ° °9.  FOLLOW UP WITH DR. SHAPIRO TODAY IN HIS [place] OFFICE AT 2:45pm. ° °  °I have received a copy of the above instructions and will follow them.  ° ° ° °IF YOU ARE IN IMMEDIATE DANGER CALL 911! ° °It is important for you to keep your follow-up appointment with your physician after discharge, OR, for you /your caregiver to make a follow-up appointment with your physician / medical provider after discharge. ° °Show these instructions to the next healthcare provider you see. °PATIENT INSTRUCTIONS °POST-ANESTHESIA ° °IMMEDIATELY FOLLOWING SURGERY:  Do not drive or operate machinery for the first twenty four hours after surgery.  Do not make any important decisions for twenty four hours after surgery or while taking narcotic pain medications or sedatives.  If you develop intractable nausea and vomiting or a severe headache please notify your doctor immediately. ° °FOLLOW-UP:  Please make an appointment with your surgeon as instructed. You do not need to follow up with anesthesia unless  specifically instructed to do so. ° °WOUND CARE INSTRUCTIONS (if applicable):  Keep a dry clean dressing on the anesthesia/puncture wound site if there is drainage.  Once the wound has quit draining you may leave it open to air.  Generally you should leave the bandage intact for twenty four hours unless there is drainage.  If the epidural site drains for more than 36-48 hours please call the anesthesia department. ° °QUESTIONS?:  Please feel free to call your physician or the hospital operator if you have any questions, and they will be happy to assist you.    ° ° ° °

## 2016-01-04 NOTE — Addendum Note (Signed)
Addendum  created 01/04/16 1719 by Moshe SalisburyKaren E Judith Campillo, CRNA   Modules edited: Charges VN

## 2016-01-04 NOTE — H&P (Signed)
The patient was re examined and there is no change in the patients condition since the original H and P. 

## 2016-01-04 NOTE — Anesthesia Postprocedure Evaluation (Signed)
Anesthesia Post Note  Patient: Diane [name]  Procedure(s) Performed: Procedure(s) (LRB): CATARACT EXTRACTION PHACO AND INTRAOCULAR LENS PLACEMENT (IOC) (Left)  Patient location during evaluation: Short Stay Anesthesia Type: MAC Level of consciousness: awake and alert Pain management: pain level controlled Vital Signs Assessment: post-procedure vital signs reviewed and stable Respiratory status: spontaneous breathing Cardiovascular status: blood pressure returned to baseline Postop Assessment: no signs of nausea or vomiting Anesthetic complications: no    Last Vitals:  Filed Vitals:   01/04/16 1000 01/04/16 1001  BP: 100/56 100/56  Pulse:    Temp:    Resp: 15 15    Last Pain: There were no vitals filed for this visit.               Nisreen Guise

## 2016-01-05 ENCOUNTER — Encounter (HOSPITAL_COMMUNITY): Payer: Self-pay | Admitting: Ophthalmology

## 2016-01-07 DIAGNOSIS — Z0000 Encounter for general adult medical examination without abnormal findings: Secondary | ICD-10-CM | POA: Diagnosis not present

## 2016-01-07 DIAGNOSIS — Z23 Encounter for immunization: Secondary | ICD-10-CM | POA: Diagnosis not present

## 2016-01-07 DIAGNOSIS — Z6838 Body mass index (BMI) 38.0-38.9, adult: Secondary | ICD-10-CM | POA: Diagnosis not present

## 2016-01-07 DIAGNOSIS — Z1389 Encounter for screening for other disorder: Secondary | ICD-10-CM | POA: Diagnosis not present

## 2016-01-13 ENCOUNTER — Encounter (HOSPITAL_COMMUNITY): Payer: Self-pay

## 2016-01-13 ENCOUNTER — Encounter (HOSPITAL_COMMUNITY)
Admission: RE | Admit: 2016-01-13 | Discharge: 2016-01-13 | Disposition: A | Discharge status: Home / Self Care | Payer: PPO | Source: Ambulatory Visit | Attending: Ophthalmology | Admitting: Ophthalmology

## 2016-01-17 ENCOUNTER — Encounter (HOSPITAL_COMMUNITY): Payer: Self-pay | Admitting: Psychiatry

## 2016-01-17 ENCOUNTER — Ambulatory Visit (INDEPENDENT_AMBULATORY_CARE_PROVIDER_SITE_OTHER): Payer: PPO | Admitting: Psychiatry

## 2016-01-17 VITALS — BP 130/70 | HR 82 | Ht 63.0 in | Wt 223.2 lb

## 2016-01-17 DIAGNOSIS — F3130 Bipolar disorder, current episode depressed, mild or moderate severity, unspecified: Secondary | ICD-10-CM

## 2016-01-17 DIAGNOSIS — F5105 Insomnia due to other mental disorder: Secondary | ICD-10-CM

## 2016-01-17 DIAGNOSIS — F489 Nonpsychotic mental disorder, unspecified: Secondary | ICD-10-CM

## 2016-01-17 MED ORDER — TRAZODONE HCL 100 MG PO TABS: 100.0000 mg | ORAL_TABLET | Freq: Every day | ORAL | Status: DC

## 2016-01-17 MED ORDER — DIVALPROEX SODIUM 250 MG PO DR TAB: 750.0000 mg | DELAYED_RELEASE_TABLET | Freq: Every day | ORAL | Status: DC

## 2016-01-17 MED ORDER — ALPRAZOLAM 0.5 MG PO TABS: ORAL_TABLET | ORAL | Status: DC

## 2016-01-17 NOTE — Progress Notes (Signed)
Patient ID: ITATI BROCKSMITH, female   DOB: 1950/12/25, 65 y.o.   MRN: [id number] Patient ID: MAYVIS AGUDELO, female   DOB: 09/02/1951, 65 y.o.   MRN: [id number] Patient ID: MIKAYLAH LIBBEY, female   DOB: [birth year], 65 y.o.   MRN: [id number] Patient ID: AUBRIAUNA RINER, female   DOB: [birth year], 65 y.o.   MRN: [id number] Patient ID: FALON HUESCA, female   DOB: [birth year], 65 y.o.   MRN: [id number] Patient ID: ELERI RUBEN, female   DOB: 1951/05/30, 65 y.o.   MRN: [id number] Patient ID: TRYSTAN EADS, female   DOB: 09/07/1951, 65 y.o.   MRN: [id number] Patient ID: MAKALYN LENNOX, female   DOB: 24-Oct-1950, 65 y.o.   MRN: [id number] Patient ID: LORELLA GOMEZ, female   DOB: Apr 05, 1951, 64 y.o.   MRN: [id number] Patient ID: SELINE ENZOR, female   DOB: [birth year], 65 y.o.   MRN: [id number] [facility] [address] Progress Note TANYSHA QUANT MRN: [id number] DOB: [birth year] Age: 65 y.o.  Date: 01/17/2016  Chief Complaint  Patient presents with  . Depression  . Anxiety  . Follow-up    History of present illness Patient is 65 year old [place] American female who came for her followup appointment. She is currently with her husband in [place] and is on disability  The patient was last seen in March. At that time she told me she was moving to [place] to be with her daughter. She didn't like living down there and she and her grandson have come back. Her grandson got his own place to live and she is now staying in a motel temporarily. Her mood is "up and down" because she is unsure where she is going to live. She's in a lot of pain in her knee and would like to get back on the amitriptyline since she ran out. A nurse practitioner and primary care put her on Celexa without realizing that she's already on Cymbalta. I explained to her that made no sense to be on both. She does admit that she still drinks periodically sometimes too much on weekends. I warned her against this. She denies being severely  depressed or suicidal  The patient returns after 3 months. She is living with her, her grandson and his girlfriend. She states that the situation is not good there financially. The girlfriend had a "mental breakdown" and had to be hospitalized. She lost her job. Her grandson's a convicted sex offender  and is trying to get his Social Security card but probably will be able to get work anyway. They're all living on her disability income. She feels overwhelmed by the stress and is thinking about moving out. She asked if she could have 1 more Xanax during the day because she is so anxious and I agreed. She denies being suicidal but just feels very stressed. I also suggested that she start to see a counselor here and she agrees Current psychotropic medication Xanax 0.5 mg  AM and 2 at bedtime Trazodone 100 mg daily at bedtime Depakote 750 mg daily at bedtime  Past psychiatric history Patient has history of one psychiatric admission in 2000 needed behavioral [place]. She took overdose on her pills she was also intoxicated with alcohol. Patient has also seen by psychiatrist in 2004. At that time she was seeking more benzodiazepines and pain medication.  Vitals: BP 130/70 mmHg  Pulse 82  Ht 5' 3" (1.6 m)  Wt 223 lb 3.2 oz (101.243 kg)  BMI  39.55 kg/m2  Allergies: Allergies  Allergen Reactions  . Neomycin-Bacitracin Zn-Polymyx Other (See Comments)    Reaction: skin starts to become raw and peels off  . Penicillins Hives    Has patient had a PCN reaction causing immediate rash, facial/tongue/throat swelling, SOB or lightheadedness with hypotension: Nono Has patient had a PCN reaction causing severe rash involving mucus membranes or skin necrosis: Noyes hives Has patient had a PCN reaction that required hospitalization Nono Has patient had a PCN reaction occurring within the last 10 years: Nono If all of the above answers are "NO", then may proceed with Cephalo  . Sulfonamide Derivatives  Hives and Swelling   Medical History: Past Medical History  Diagnosis Date  . Hypertension   . Allergic rhinitis   . Anxiety   . Depression   . GERD (gastroesophageal reflux disease)   . High cholesterol   . Hypothyroid   . Chronic back pain     Sees pain mamagement clinic  . Arthritis   . Immature cataract   . Sleep apnea     STOP BANG score=4  . Seizures ([place]) 20 yrs ago    unknown etiology-on meds  . Headaches, cluster 3 yrs ago    last one  . Mania ([place])   . 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. PTSD (post-traumatic stress disorder)   . Cataracts, bilateral   Patient has history of hypertension, allergic rhinitis, hypothyroidism, chronic back pain, hyperlipidemia and obesity.  She most recently had surgery on her right knee.  Surgical History: Past Surgical History  Procedure Laterality Date  . Left foot  x 2  . Foot surgery      left foot-  . Partial hysterectomy    . Oophoectomy  bilateral  . Cholecystectomy    . Lumbar disc surgery  L4-5  . Back surgery    . Knee arthroscopy  01/2012    right  . Total knee arthroplasty  07/01/2012    Procedure: TOTAL KNEE ARTHROPLASTY;  Surgeon: Carole Civil, MD;  Location: [facility];  Service: Orthopedics;  Laterality: Right;  . Total knee revision Right 01/03/2013    Procedure: Patellaplasty Right Knee;  Surgeon: Carole Civil, MD;  Location: [facility];  Service: Orthopedics;  Laterality: Right;  . Knee arthroscopy with lateral release Right 01/03/2013    Procedure: Lateral Release Patella Right Knee;  Surgeon: Carole Civil, MD;  Location: [facility];  Service: Orthopedics;  Laterality: Right;  . Incision and drainage Right 01/03/2013    Procedure: INCISION AND DRAINAGE;  Surgeon: Carole Civil, MD;  Location: [facility];  Service: Orthopedics;  Laterality: Right;  . Esophagogastroduodenoscopy  10/16/2008    DDU:KGURK hiatal hernia otherwise normal esophagus, stomach  D1, D2, status post passage of a 56-French Maloney dilator   . Colonoscopy  10/16/2008    YHC:WCBJSEGBTDV, otherwise normal rectum; pancolonic diverticula the remainder of the colonic mucosa appeared normal. Next TCS due 10/2013.  Marland Kitchen Esophagogastroduodenoscopy  06/28/2006    VOH:YWVPXT esophagus. Small hiatal hernia. Otherwise normal stomach, D1 and D2, status post passage of a 56 [place] Maloney dilator  . Colonoscopy  06/28/2006    RMR: Internal hemorrhoids. Diminutive rectal polyps, cold biopsied/ Pancolonic diverticula. Polyps in the right colon removed   . Esophagogastroduodenoscopy (egd) with esophageal dilation N/A 07/14/2013    RMR: Abnormal esophagus of uncertain significance-status post esophageal biospy. small hiatal hernia.   . Esophagogastroduodenoscopy (egd) with propofol N/A 05/20/2015    RMR: NORMAL EGD status post  maloney dilation   . Maloney dilation N/A 05/20/2015    Procedure: Venia Minks DILATION;  Surgeon: Daneil Dolin, MD;  Location: [facility];  Service: Endoscopy;  Laterality: N/A;  Maloney dilator # 39  . Colonoscopy with propofol N/A 05/20/2015    RMR: Pancolonic diverticulosis. Redundant colon   . Cataract extraction w/phaco Left 01/04/2016    Procedure: CATARACT EXTRACTION PHACO AND INTRAOCULAR LENS PLACEMENT (IOC);  Surgeon: Rutherford Guys, MD;  Location: [facility];  Service: Ophthalmology;  Laterality: Left;  CDE:4.70   Family History: family history includes ADD / ADHD in her grandchild and grandchild; Alcohol abuse in her brother, brother, brother, brother, brother, father, and mother; Anxiety disorder in her brother, brother, brother, brother, brother, father, and mother; Bipolar disorder in her sister; Dementia in her paternal aunt; Drug abuse in her brother and brother; Seizures in her brother and brother. There is no history of Depression, OCD, Paranoid behavior, Schizophrenia, Sexual abuse, Physical abuse, or Colon cancer.   Psychosocial history Patient has been married for 16 years. She has 2 children. She is married however her  relationship is very tense. She has history of leaving her husband in the past.  Currently she lives by herself.  Mental status examination Patient is mildly obese female who is casually dressed and fairly groomed.  .  She is cooperative and maintained fair eye contact her mood is Anxious and her affect is subdued She denies suicidal ideation . Her speech is clear and coherent. Her attention and concentration is fair. . She's alert and oriented x3. There are no psychotic symptoms present at this time. She denies any auditory or visual hallucination. Her insight judgment is fair and her impulse control is okay.  Lab Results:  Results for orders placed or performed during the hospital encounter of 12/31/15 (from the past 8736 hour(s))  Surgical pcr screen   Collection Time: 12/31/15 10:57 AM  Result Value Ref Range   MRSA, PCR NEGATIVE NEGATIVE   Staphylococcus aureus NEGATIVE NEGATIVE  CBC   Collection Time: 12/31/15 10:57 AM  Result Value Ref Range   WBC 4.7 4.0 - 10.5 K/uL   RBC 4.41 3.87 - 5.11 MIL/uL   Hemoglobin 12.6 12.0 - 15.0 g/dL   HCT 38.6 36.0 - 46.0 %   MCV 87.5 78.0 - 100.0 fL   MCH 28.6 26.0 - 34.0 pg   MCHC 32.6 30.0 - 36.0 g/dL   RDW 14.1 11.5 - 15.5 %   Platelets 153 150 - 400 K/uL  Basic metabolic panel   Collection Time: 12/31/15 10:57 AM  Result Value Ref Range   Sodium 142 135 - 145 mmol/L   Potassium 3.9 3.5 - 5.1 mmol/L   Chloride 107 101 - 111 mmol/L   CO2 26 22 - 32 mmol/L   Glucose, Bld 105 (H) 65 - 99 mg/dL   BUN 14 6 - 20 mg/dL   Creatinine, Ser 0.91 0.44 - 1.00 mg/dL   Calcium 8.8 (L) 8.9 - 10.3 mg/dL   GFR calc non Af Amer >60 >60 mL/min   GFR calc Af Amer >60 >60 mL/min   Anion gap 9 5 - 15  Results for orders placed or performed in visit on 08/09/15 (from the past 8736 hour(s))  CBC with Differential/Platelet   Collection Time: 08/23/15  3:17 PM  Result Value Ref Range   WBC 4.9 4.0 - 10.5 K/uL   RBC 4.58 3.87 - 5.11 MIL/uL   Hemoglobin 12.4  12.0 - 15.0 g/dL  HCT 37.4 36.0 - 46.0 %   MCV 81.7 78.0 - 100.0 fL   MCH 27.1 26.0 - 34.0 pg   MCHC 33.2 30.0 - 36.0 g/dL   RDW 14.7 11.5 - 15.5 %   Platelets 192 150 - 400 K/uL   MPV 11.4 8.6 - 12.4 fL   Neutrophils Relative % 37 (L) 43 - 77 %   Neutro Abs 1.8 1.7 - 7.7 K/uL   Lymphocytes Relative 54 (H) 12 - 46 %   Lymphs Abs 2.6 0.7 - 4.0 K/uL   Monocytes Relative 8 3 - 12 %   Monocytes Absolute 0.4 0.1 - 1.0 K/uL   Eosinophils Relative 1 0 - 5 %   Eosinophils Absolute 0.0 0.0 - 0.7 K/uL   Basophils Relative 0 0 - 1 %   Basophils Absolute 0.0 0.0 - 0.1 K/uL   Smear Review Criteria for review not met   Comprehensive metabolic panel   Collection Time: 08/23/15  3:17 PM  Result Value Ref Range   Sodium 139 135 - 146 mmol/L   Potassium 4.4 3.5 - 5.3 mmol/L   Chloride 103 98 - 110 mmol/L   CO2 29 20 - 31 mmol/L   Glucose, Bld 82 65 - 99 mg/dL   BUN 11 7 - 25 mg/dL   Creat 0.76 0.50 - 0.99 mg/dL   Total Bilirubin 0.6 0.2 - 1.2 mg/dL   Alkaline Phosphatase 65 33 - 130 U/L   AST 13 10 - 35 U/L   ALT 8 6 - 29 U/L   Total Protein 6.5 6.1 - 8.1 g/dL   Albumin 3.4 (L) 3.6 - 5.1 g/dL   Calcium 8.8 8.6 - 10.4 mg/dL  Lipase   Collection Time: 08/23/15  3:17 PM  Result Value Ref Range   Lipase 11 7 - 60 U/L  Results for orders placed or performed during the hospital encounter of 05/19/15 (from the past 8736 hour(s))  Surgical pcr screen   Collection Time: 05/19/15  2:15 PM  Result Value Ref Range   MRSA, PCR NEGATIVE NEGATIVE   Staphylococcus aureus NEGATIVE NEGATIVE  CBC with Differential/Platelet   Collection Time: 05/19/15  2:15 PM  Result Value Ref Range   WBC 6.7 4.0 - 10.5 K/uL   RBC 4.67 3.87 - 5.11 MIL/uL   Hemoglobin 12.7 12.0 - 15.0 g/dL   HCT 39.2 36.0 - 46.0 %   MCV 83.9 78.0 - 100.0 fL   MCH 27.2 26.0 - 34.0 pg   MCHC 32.4 30.0 - 36.0 g/dL   RDW 12.7 11.5 - 15.5 %   Platelets 183 150 - 400 K/uL   Neutrophils Relative % 41 (L) 43 - 77 %   Neutro Abs 2.8 1.7  - 7.7 K/uL   Lymphocytes Relative 48 (H) 12 - 46 %   Lymphs Abs 3.2 0.7 - 4.0 K/uL   Monocytes Relative 10 3 - 12 %   Monocytes Absolute 0.7 0.1 - 1.0 K/uL   Eosinophils Relative 1 0 - 5 %   Eosinophils Absolute 0.0 0.0 - 0.7 K/uL   Basophils Relative 0 0 - 1 %   Basophils Absolute 0.0 0.0 - 0.1 K/uL  Basic metabolic panel   Collection Time: 05/19/15  2:15 PM  Result Value Ref Range   Sodium 142 135 - 145 mmol/L   Potassium 3.6 3.5 - 5.1 mmol/L   Chloride 105 101 - 111 mmol/L   CO2 30 22 - 32 mmol/L   Glucose, Bld 82 65 -  99 mg/dL   BUN 18 6 - 20 mg/dL   Creatinine, Ser 0.92 0.44 - 1.00 mg/dL   Calcium 8.7 (L) 8.9 - 10.3 mg/dL   GFR calc non Af Amer >60 >60 mL/min   GFR calc Af Amer >60 >60 mL/min   Anion gap 7 5 - 15   Assessment Axis I bipolar disorder NOS, benzodiazepine abuse, alcohol abuse Axis II deferred Axis III see medical history Axis IV moderate Axis V 60-65  Plan/Discussion: She'll continue trazodone 100 mg daily at bedtime, continue Xanax for anxiety  0.5 mg in the morning and 1 mg at bedtime and adding other 0.5 mg in the afternoon and Depakote for mood disorder She'll return to see me in 2 months. She will be scheduled with a counselor here Time spent 15 minutes.  More than 50% of the time spent in psychoeducation, counseling and coordination of care.  Discuss safety plan that anytime having active suicidal thoughts or homicidal thoughts then patient need to call 911 or go to the local emergency room.  MEDICATIONS this encounter: Meds ordered this encounter  Medications  . linaclotide (LINZESS) 145 MCG CAPS capsule    Sig: Take 145 mcg by mouth 2 (two) times daily.  . divalproex (DEPAKOTE) 250 MG DR tablet    Sig: Take 3 tablets (750 mg total) by mouth at bedtime.    Dispense:  90 tablet    Refill:  2  . traZODone (DESYREL) 100 MG tablet    Sig: Take 1 tablet (100 mg total) by mouth at bedtime.    Dispense:  30 tablet    Refill:  2  . ALPRAZolam (XANAX)  0.5 MG tablet    Sig: Take one twice a day and 2 at bedtime    Dispense:  120 tablet    Refill:  2    Medical Decision Making Problem Points:  Established problem, stable/improving (1), Established problem, worsening (2), New problem, with no additional work-up planned (3), Review of last therapy session (1) and Review of psycho-social stressors (1) Data Points:  Review or order clinical lab tests (1) Review and summation of old records (2) Review of medication regiment & side effects (2) Review of new medications or change in dosage (2)  ROSS, DEBORAH, MD

## 2016-01-18 ENCOUNTER — Ambulatory Visit (HOSPITAL_COMMUNITY): Payer: PPO | Admitting: Anesthesiology

## 2016-01-18 ENCOUNTER — Encounter (HOSPITAL_COMMUNITY)
Admission: RE | Disposition: A | Discharge status: Home / Self Care | Payer: Self-pay | Source: Ambulatory Visit | Attending: Ophthalmology

## 2016-01-18 ENCOUNTER — Ambulatory Visit (HOSPITAL_COMMUNITY)
Admission: RE | Admit: 2016-01-18 | Discharge: 2016-01-18 | Disposition: A | Discharge status: Home / Self Care | Payer: PPO | Source: Ambulatory Visit | Attending: Ophthalmology | Admitting: Ophthalmology

## 2016-01-18 ENCOUNTER — Encounter (HOSPITAL_COMMUNITY): Payer: Self-pay | Admitting: *Deleted

## 2016-01-18 DIAGNOSIS — E039 Hypothyroidism, unspecified: Secondary | ICD-10-CM | POA: Insufficient documentation

## 2016-01-18 DIAGNOSIS — F319 Bipolar disorder, unspecified: Secondary | ICD-10-CM | POA: Diagnosis not present

## 2016-01-18 DIAGNOSIS — I10 Essential (primary) hypertension: Secondary | ICD-10-CM | POA: Diagnosis not present

## 2016-01-18 DIAGNOSIS — F419 Anxiety disorder, unspecified: Secondary | ICD-10-CM | POA: Insufficient documentation

## 2016-01-18 DIAGNOSIS — H25011 Cortical age-related cataract, right eye: Secondary | ICD-10-CM | POA: Diagnosis not present

## 2016-01-18 DIAGNOSIS — G4730 Sleep apnea, unspecified: Secondary | ICD-10-CM | POA: Diagnosis not present

## 2016-01-18 DIAGNOSIS — Z87891 Personal history of nicotine dependence: Secondary | ICD-10-CM | POA: Diagnosis not present

## 2016-01-18 DIAGNOSIS — H2511 Age-related nuclear cataract, right eye: Secondary | ICD-10-CM | POA: Insufficient documentation

## 2016-01-18 DIAGNOSIS — K219 Gastro-esophageal reflux disease without esophagitis: Secondary | ICD-10-CM | POA: Diagnosis not present

## 2016-01-18 DIAGNOSIS — M1990 Unspecified osteoarthritis, unspecified site: Secondary | ICD-10-CM | POA: Insufficient documentation

## 2016-01-18 DIAGNOSIS — H269 Unspecified cataract: Secondary | ICD-10-CM | POA: Diagnosis not present

## 2016-01-18 HISTORY — PX: CATARACT EXTRACTION W/PHACO: SHX586

## 2016-01-18 SURGERY — PHACOEMULSIFICATION, CATARACT, WITH IOL INSERTION
Anesthesia: Monitor Anesthesia Care | Site: Eye | Laterality: Right

## 2016-01-18 MED ORDER — PROVISC 10 MG/ML IO SOLN
INTRAOCULAR | Status: DC | PRN
  Administered 2016-01-18: 0.85 mL via INTRAOCULAR

## 2016-01-18 MED ORDER — FENTANYL CITRATE (PF) 100 MCG/2ML IJ SOLN: 25.0000 ug | INTRAMUSCULAR | Status: DC | PRN

## 2016-01-18 MED ORDER — ONDANSETRON HCL 4 MG/2ML IJ SOLN: 4.0000 mg | Freq: Once | INTRAMUSCULAR | Status: DC | PRN

## 2016-01-18 MED ORDER — LACTATED RINGERS IV SOLN
INTRAVENOUS | Status: DC
  Administered 2016-01-18: 07:00:00 via INTRAVENOUS

## 2016-01-18 MED ORDER — MIDAZOLAM HCL 2 MG/2ML IJ SOLN
INTRAMUSCULAR | Status: AC
  Filled 2016-01-18: qty 2

## 2016-01-18 MED ORDER — PHENYLEPHRINE HCL 2.5 % OP SOLN
1.0000 [drp] | OPHTHALMIC | Status: AC
  Administered 2016-01-18 (×3): 1 [drp] via OPHTHALMIC

## 2016-01-18 MED ORDER — TETRACAINE HCL 0.5 % OP SOLN
1.0000 [drp] | OPHTHALMIC | Status: AC
  Administered 2016-01-18 (×3): 1 [drp] via OPHTHALMIC

## 2016-01-18 MED ORDER — HYDROCODONE-ACETAMINOPHEN 5-325 MG PO TABS
2.0000 | ORAL_TABLET | Freq: Once | ORAL | Status: AC
  Administered 2016-01-18: 2 via ORAL

## 2016-01-18 MED ORDER — EPINEPHRINE HCL 1 MG/ML IJ SOLN
INTRAOCULAR | Status: DC | PRN
  Administered 2016-01-18: 500 mL

## 2016-01-18 MED ORDER — CYCLOPENTOLATE-PHENYLEPHRINE 0.2-1 % OP SOLN
1.0000 [drp] | OPHTHALMIC | Status: AC
  Administered 2016-01-18 (×3): 1 [drp] via OPHTHALMIC

## 2016-01-18 MED ORDER — MIDAZOLAM HCL 2 MG/2ML IJ SOLN
1.0000 mg | INTRAMUSCULAR | Status: DC | PRN
  Administered 2016-01-18: 2 mg via INTRAVENOUS

## 2016-01-18 MED ORDER — KETOROLAC TROMETHAMINE 0.5 % OP SOLN
1.0000 [drp] | OPHTHALMIC | Status: AC
  Administered 2016-01-18 (×3): 1 [drp] via OPHTHALMIC

## 2016-01-18 MED ORDER — TETRACAINE 0.5 % OP SOLN OPTIME - NO CHARGE
OPHTHALMIC | Status: DC | PRN
  Administered 2016-01-18: 2 [drp] via OPHTHALMIC

## 2016-01-18 MED ORDER — HYDROCODONE-ACETAMINOPHEN 5-325 MG PO TABS
ORAL_TABLET | ORAL | Status: AC
  Filled 2016-01-18: qty 2

## 2016-01-18 MED ORDER — BSS IO SOLN
INTRAOCULAR | Status: DC | PRN
  Administered 2016-01-18: 15 mL via INTRAOCULAR

## 2016-01-18 MED ORDER — EPINEPHRINE HCL 1 MG/ML IJ SOLN
INTRAMUSCULAR | Status: AC
  Filled 2016-01-18: qty 1

## 2016-01-18 SURGICAL SUPPLY — 23 items
CAPSULAR TENSION RING-AMO (OPHTHALMIC RELATED) IMPLANT
CLOTH BEACON ORANGE TIMEOUT ST (SAFETY) ×2 IMPLANT
EYE SHIELD UNIVERSAL CLEAR (GAUZE/BANDAGES/DRESSINGS) ×2 IMPLANT
GLOVE BIO SURGEON STRL SZ 6.5 (GLOVE) ×2 IMPLANT
GLOVE BIOGEL PI IND STRL 7.0 (GLOVE) ×1 IMPLANT
GLOVE BIOGEL PI INDICATOR 7.0 (GLOVE) ×1
GLOVE ECLIPSE 6.5 STRL STRAW (GLOVE) IMPLANT
GLOVE EXAM NITRILE LRG STRL (GLOVE) IMPLANT
GLOVE EXAM NITRILE MD LF STRL (GLOVE) IMPLANT
HEALON 5 0.6 ML (INTRAOCULAR LENS) IMPLANT
KIT VITRECTOMY (OPHTHALMIC RELATED) IMPLANT
LENS ALC ACRYL/TECN (Ophthalmic Related) ×2 IMPLANT
PAD ARMBOARD 7.5X6 YLW CONV (MISCELLANEOUS) ×2 IMPLANT
PROC W NO LENS (INTRAOCULAR LENS)
PROC W SPEC LENS (INTRAOCULAR LENS)
PROCESS W NO LENS (INTRAOCULAR LENS) IMPLANT
PROCESS W SPEC LENS (INTRAOCULAR LENS) IMPLANT
RETRACTOR IRIS SIGHTPATH (OPHTHALMIC RELATED) IMPLANT
RING MALYGIN (MISCELLANEOUS) IMPLANT
TAPE SURG TRANSPORE 1 IN (GAUZE/BANDAGES/DRESSINGS) ×1 IMPLANT
TAPE SURGICAL TRANSPORE 1 IN (GAUZE/BANDAGES/DRESSINGS) ×1
VISCOELASTIC ADDITIONAL (OPHTHALMIC RELATED) IMPLANT
WATER STERILE IRR 250ML POUR (IV SOLUTION) ×2 IMPLANT

## 2016-01-18 NOTE — Progress Notes (Signed)
Pt rated pain at 7 initially.  Advised Dr Benancio DeedsNewsome who ordered Vicodin tablets PO x 2 which was administered.  Advised Dr Nile RiggsShapiro who stated he would see patient this afternoon.   10 minutes after administration of Vicodin patient stated pain was improving and rated as a 6 "but it's getting better."  Patient subsequently discharged with instructions to call Dr Nile RiggsShapiro if pain worsens and to see him this afternoon between 2:00 and 2:30.

## 2016-01-18 NOTE — Anesthesia Postprocedure Evaluation (Signed)
Anesthesia Post Note  Patient: Diane [name]  Procedure(s) Performed: Procedure(s) (LRB): CATARACT EXTRACTION PHACO AND INTRAOCULAR LENS PLACEMENT RIGHT EYE; CDE:  4.66 (Right)  Patient location during evaluation: Short Stay Anesthesia Type: MAC Level of consciousness: awake and oriented Pain management: pain level controlled Vital Signs Assessment: post-procedure vital signs reviewed and stable Respiratory status: spontaneous breathing, nonlabored ventilation and respiratory function stable Cardiovascular status: blood pressure returned to baseline and stable Postop Assessment: no signs of nausea or vomiting Anesthetic complications: no    Last Vitals:  Filed Vitals:   01/18/16 0725 01/18/16 0730  BP: 114/59 105/69  Temp:    Resp: 12 12    Last Pain: There were no vitals filed for this visit.               Darik Massing J

## 2016-01-18 NOTE — Discharge Instructions (Signed)
°  °          [facility] Instructions °[address]- [place] [address]-[place] °    ° °1. Avoid closing eyes tightly. One often closes the eye tightly when laughing, talking, sneezing, coughing or if they feel irritated. At these times, you should be careful not to close your eyes tightly. ° °2. Instill eye drops as instructed. To instill drops in your eye, open it, look up and have someone gently pull the lower lid down and instill a couple of drops inside the lower lid. ° °3. Do not touch upper lid. ° °4. Take Advil or Tylenol for pain. ° °5. You may use either eye for near work, such as reading or sewing and you may watch television. ° °6. You may have your hair done at the beauty parlor at any time. ° °7. Wear dark glasses with or without your own glasses if you are in bright light. ° °8. Call our office at [phone] or [phone] if you have sharp pain in your eye or unusual symptoms. ° °9.  FOLLOW UP WITH DR. SHAPIRO TODAY IN HIS [place] OFFICE AT 2:45pm. ° °  °I have received a copy of the above instructions and will follow them.  ° ° ° °IF YOU ARE IN IMMEDIATE DANGER CALL 911! ° °It is important for you to keep your follow-up appointment with your physician after discharge, OR, for you /your caregiver to make a follow-up appointment with your physician / medical provider after discharge. ° °Show these instructions to the next healthcare provider you see. ° °

## 2016-01-18 NOTE — Anesthesia Preprocedure Evaluation (Signed)
Anesthesia Evaluation    Airway Mallampati: III       Dental  (+) Edentulous Upper, Edentulous Lower, Upper Dentures   Pulmonary sleep apnea , former smoker,     + decreased breath sounds      Cardiovascular hypertension, Pt. on medications Normal cardiovascular exam     Neuro/Psych  Headaches, Anxiety Depression Bipolar Disorder    GI/Hepatic GERD  Medicated and Controlled,  Endo/Other  Hypothyroidism Morbid obesity  Renal/GU      Musculoskeletal  (+) Arthritis , Osteoarthritis,    Abdominal (+) + obese,   Peds  Hematology   Anesthesia Other Findings   Reproductive/Obstetrics                             Anesthesia Physical Anesthesia Plan  ASA: III  Anesthesia Plan: MAC   Post-op Pain Management:    Induction: Intravenous  Airway Management Planned: Nasal Cannula  Additional Equipment:   Intra-op Plan:   Post-operative Plan:   Informed Consent: I have reviewed the patients History and Physical, chart, labs and discussed the procedure including the risks, benefits and alternatives for the proposed anesthesia with the patient or authorized representative who has indicated his/her understanding and acceptance.     Plan Discussed with: CRNA  Anesthesia Plan Comments:         Anesthesia Quick Evaluation

## 2016-01-18 NOTE — Op Note (Signed)
Patient brought to the operating room and prepped and draped in the usual manner.  Lid speculum inserted in right eye.  Stab incision made at the twelve o'clock position.  Provisc instilled in the anterior chamber.   A 2.4 mm. Stab incision was made temporally.  An anterior capsulotomy was done with a bent 25 gauge needle.  The nucleus was hydrodissected.  The Phaco tip was inserted in the anterior chamber and the nucleus was emulsified.  CDE was 4.66.  The cortical material was then removed with the I and A tip.  Posterior capsule was the polished.  The anterior chamber was deepened with Provisc.  A 20.5 Diopter Hoya Model 259 IOL was then inserted in the capsular bag.  Provisc was then removed with the I and A tip.  The wound was then hydrated.  Patient sent to the Recovery Room in good condition with follow up in my office.  Preoperative Diagnosis: Cortical and Nuclear Cataract OD Postoperative Diagnosis:  Same Procedure name: Kelman Phacoemulsification OD with IOL

## 2016-01-18 NOTE — H&P (Signed)
The patient was re examined and there is no change in the patients condition since the original H and P. 

## 2016-01-18 NOTE — Transfer of Care (Signed)
Immediate Anesthesia Transfer of Care Note  Patient: Diane [name]  Procedure(s) Performed: Procedure(s): CATARACT EXTRACTION PHACO AND INTRAOCULAR LENS PLACEMENT RIGHT EYE; CDE:  4.66 (Right)  Patient Location: Short Stay  Anesthesia Type:MAC  Level of Consciousness: awake, alert  and patient cooperative  Airway & Oxygen Therapy: Patient Spontanous Breathing  Post-op Assessment: Report given to RN, Post -op Vital signs reviewed and stable and Patient moving all extremities  Post vital signs: Reviewed and stable  Last Vitals:  Filed Vitals:   01/18/16 0725 01/18/16 0730  BP: 114/59 105/69  Temp:    Resp: 12 12    Last Pain: There were no vitals filed for this visit.    Patients Stated Pain Goal: 7 (01/18/16 [id number])  Complications: No apparent anesthesia complications

## 2016-01-18 NOTE — Addendum Note (Signed)
Addendum  created 01/18/16 0813 by Benita StabileLisa Testa Anwar Sakata, MD   Modules edited: Anesthesia Attestations

## 2016-01-19 ENCOUNTER — Encounter (HOSPITAL_COMMUNITY): Payer: Self-pay | Admitting: Ophthalmology

## 2016-01-20 ENCOUNTER — Other Ambulatory Visit: Payer: Self-pay | Admitting: *Deleted

## 2016-01-20 ENCOUNTER — Telehealth: Payer: Self-pay | Admitting: Orthopedic Surgery

## 2016-01-20 DIAGNOSIS — M5442 Lumbago with sciatica, left side: Secondary | ICD-10-CM | POA: Diagnosis not present

## 2016-01-20 DIAGNOSIS — M1711 Unilateral primary osteoarthritis, right knee: Secondary | ICD-10-CM

## 2016-01-20 MED ORDER — OXYCODONE-ACETAMINOPHEN 7.5-325 MG PO TABS: 1.0000 | ORAL_TABLET | Freq: Four times a day (QID) | ORAL | Status: DC | PRN

## 2016-01-20 NOTE — Telephone Encounter (Signed)
Patient called and requested a refill on Percocet 7.5-325 mgs.  Qty 120   Sig: Take 1 tablet by mouth every 4 (four) hours as needed for moderate pain (Must last 30 days. Do not drive or operate machinery while taking this medicine.).

## 2016-02-03 ENCOUNTER — Ambulatory Visit (HOSPITAL_COMMUNITY): Payer: Self-pay | Admitting: Psychology

## 2016-02-22 ENCOUNTER — Ambulatory Visit (HOSPITAL_COMMUNITY): Payer: Self-pay | Admitting: Psychology

## 2016-02-23 ENCOUNTER — Other Ambulatory Visit: Payer: Self-pay | Admitting: *Deleted

## 2016-02-23 ENCOUNTER — Telehealth: Payer: Self-pay | Admitting: Orthopedic Surgery

## 2016-02-23 DIAGNOSIS — M1711 Unilateral primary osteoarthritis, right knee: Secondary | ICD-10-CM

## 2016-02-23 MED ORDER — OXYCODONE-ACETAMINOPHEN 7.5-325 MG PO TABS: 1.0000 | ORAL_TABLET | Freq: Four times a day (QID) | ORAL | Status: DC | PRN

## 2016-02-23 NOTE — Telephone Encounter (Signed)
Prescription available 

## 2016-02-23 NOTE — Telephone Encounter (Signed)
Oxycodone-Acetaminophen 7.5/325 mg  Qty 120 Tablets  Take 1 by mouth every 6 (six) hours as needed

## 2016-03-10 ENCOUNTER — Encounter (HOSPITAL_COMMUNITY): Payer: Self-pay | Admitting: Psychiatry

## 2016-03-10 ENCOUNTER — Ambulatory Visit (INDEPENDENT_AMBULATORY_CARE_PROVIDER_SITE_OTHER): Payer: PPO | Admitting: Psychiatry

## 2016-03-10 VITALS — BP 109/73 | HR 77 | Ht 63.0 in | Wt 214.6 lb

## 2016-03-10 DIAGNOSIS — F489 Nonpsychotic mental disorder, unspecified: Secondary | ICD-10-CM | POA: Diagnosis not present

## 2016-03-10 DIAGNOSIS — F5105 Insomnia due to other mental disorder: Secondary | ICD-10-CM | POA: Diagnosis not present

## 2016-03-10 DIAGNOSIS — F3130 Bipolar disorder, current episode depressed, mild or moderate severity, unspecified: Secondary | ICD-10-CM

## 2016-03-10 MED ORDER — ESCITALOPRAM OXALATE 20 MG PO TABS: 20.0000 mg | ORAL_TABLET | Freq: Every day | ORAL | Status: DC

## 2016-03-10 MED ORDER — DIVALPROEX SODIUM 250 MG PO DR TAB
750.0000 mg | DELAYED_RELEASE_TABLET | Freq: Every day | ORAL | Status: DC
Start: 2016-03-10 — End: 2016-04-19

## 2016-03-10 MED ORDER — ZOLPIDEM TARTRATE 10 MG PO TABS: 10.0000 mg | ORAL_TABLET | Freq: Every evening | ORAL | Status: DC | PRN

## 2016-03-10 MED ORDER — ALPRAZOLAM 0.5 MG PO TABS: ORAL_TABLET | ORAL | Status: DC

## 2016-03-10 NOTE — Progress Notes (Signed)
Patient ID: TINY CHAUDHARY, female   DOB: 1950/10/12, 65 y.o.   MRN: [id number] Patient ID: DOLCE SYLVIA, female   DOB: 07/31/1951, 65 y.o.   MRN: [id number] Patient ID: LYNASIA MELOCHE, female   DOB: 1951-06-07, 65 y.o.   MRN: [id number] Patient ID: REIGHN KAPLAN, female   DOB: 1951/02/03, 65 y.o.   MRN: [id number] Patient ID: HADLIE GIPSON, female   DOB: [birth year], 65 y.o.   MRN: [id number] Patient ID: MORINE KOHLMAN, female   DOB: Mar 18, 1951, 65 y.o.   MRN: [id number] Patient ID: NATHAN STALLWORTH, female   DOB: 04-Oct-1950, 65 y.o.   MRN: [id number] Patient ID: TAWNEY VANORMAN, female   DOB: 1951-08-09, 65 y.o.   MRN: [id number] Patient ID: TIEISHA DARDEN, female   DOB: 01/02/1951, 65 y.o.   MRN: [id number] Patient ID: RHONDA LINAN, female   DOB: 1950-10-30, 65 y.o.   MRN: [id number] Patient ID: KENYETTA WIMBISH, female   DOB: [birth year], 65 y.o.   MRN: [id number] [facility] [address] Progress Note SHANIRA TINE MRN: [id number] DOB: 02-May-1951 Age: 65 y.o.  Date: 03/10/2016  Chief Complaint  Patient presents with  . Depression  . Anxiety  . Follow-up    History of present illness Patient is 65 year old [place] American female who came for her followup appointment. She is currently with her husband in [place] and is on disability  The patient was last seen in March. At that time she told me she was moving to [place] to be with her daughter. She didn't like living down there and she and her grandson have come back. Her grandson got his own place to live and she is now staying in a motel temporarily. Her mood is "up and down" because she is unsure where she is going to live. She's in a lot of pain in her knee and would like to get back on the amitriptyline since she ran out. A nurse practitioner and primary care put her on Celexa without realizing that she's already on Cymbalta. I explained to her that made no sense to be on both. She does admit that she still drinks periodically sometimes too  much on weekends. I warned her against this. She denies being severely depressed or suicidal  The patient returns after 4 weeks. She has had cataract surgery since I saw her and it went well. She is under a little bit less stress at home because her grandsons girlfriend is probably going to be able to get a new job and help her out financially. She still somewhat depressed and I suggested we add Lexapro. She also cannot sleep with the trazodone and we will switch to Ambien. She denies being suicidal Current psychotropic medication Xanax 0.5 mg  AM and 2 at bedtime Trazodone 100 mg daily at bedtime Depakote 750 mg daily at bedtime  Past psychiatric history Patient has history of one psychiatric admission in 2000 needed behavioral [facility]. She took overdose on her pills she was also intoxicated with alcohol. Patient has also seen by psychiatrist in 2004. At that time she was seeking more benzodiazepines and pain medication.  Vitals: BP 109/73 mmHg  Pulse 77  Ht '5\' 3"'  (1.6 m)  Wt 214 lb 9.6 oz (97.342 kg)  BMI 38.02 kg/m2  Allergies: Allergies  Allergen Reactions  . Neomycin-Bacitracin Zn-Polymyx Other (See Comments)    Reaction: skin starts to become raw and peels off  . Penicillins Hives    Has patient  had a PCN reaction causing immediate rash, facial/tongue/throat swelling, SOB or lightheadedness with hypotension: Nono Has patient had a PCN reaction causing severe rash involving mucus membranes or skin necrosis: Noyes hives Has patient had a PCN reaction that required hospitalization Nono Has patient had a PCN reaction occurring within the last 10 years: Nono If all of the above answers are "NO", then may proceed with Cephalo  . Sulfonamide Derivatives Hives and Swelling   Medical History: Past Medical History  Diagnosis Date  . Hypertension   . Allergic rhinitis   . Anxiety   . Depression   . GERD (gastroesophageal reflux disease)   . High cholesterol   . Hypothyroid    . Chronic back pain     Sees [facility]  . Arthritis   . Immature cataract   . Sleep apnea     STOP BANG score=4  . Seizures ([place]) 20 yrs ago    unknown etiology-on meds  . Headaches, cluster 3 yrs ago    last one  . Mania ([place])   . 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. PTSD (post-traumatic stress disorder)   . Cataracts, bilateral   Patient has history of hypertension, allergic rhinitis, hypothyroidism, chronic back pain, hyperlipidemia and obesity.  She most recently had surgery on her right knee.  Surgical History: Past Surgical History  Procedure Laterality Date  . Left foot  x 2  . Foot surgery      left foot-  . Partial hysterectomy    . Oophoectomy  bilateral  . Cholecystectomy    . Lumbar disc surgery  L4-5  . Back surgery    . Knee arthroscopy  01/2012    right  . Total knee arthroplasty  07/01/2012    Procedure: TOTAL KNEE ARTHROPLASTY;  Surgeon: Carole Civil, MD;  Location: [facility];  Service: Orthopedics;  Laterality: Right;  . Total knee revision Right 01/03/2013    Procedure: Patellaplasty Right Knee;  Surgeon: Carole Civil, MD;  Location: [facility];  Service: Orthopedics;  Laterality: Right;  . Knee arthroscopy with lateral release Right 01/03/2013    Procedure: Lateral Release Patella Right Knee;  Surgeon: Carole Civil, MD;  Location: [facility];  Service: Orthopedics;  Laterality: Right;  . Incision and drainage Right 01/03/2013    Procedure: INCISION AND DRAINAGE;  Surgeon: Carole Civil, MD;  Location: [facility];  Service: Orthopedics;  Laterality: Right;  . Esophagogastroduodenoscopy  10/16/2008    LAG:TXMIW hiatal hernia otherwise normal esophagus, stomach  D1, D2, status post passage of a 56-French Maloney dilator  . Colonoscopy  10/16/2008    OEH:OZYYQMGNOIB, otherwise normal rectum; pancolonic diverticula the remainder of the colonic mucosa appeared normal. Next TCS due 10/2013.  Marland Kitchen Esophagogastroduodenoscopy  06/28/2006    BCW:UGQBVQ  esophagus. Small hiatal hernia. Otherwise normal stomach, D1 and D2, status post passage of a 56 [place] Maloney dilator  . Colonoscopy  06/28/2006    RMR: Internal hemorrhoids. Diminutive rectal polyps, cold biopsied/ Pancolonic diverticula. Polyps in the right colon removed   . Esophagogastroduodenoscopy (egd) with esophageal dilation N/A 07/14/2013    RMR: Abnormal esophagus of uncertain significance-status post esophageal biospy. small hiatal hernia.   . Esophagogastroduodenoscopy (egd) with propofol N/A 05/20/2015    RMR: NORMAL EGD status post maloney dilation   . Maloney dilation N/A 05/20/2015    Procedure: Venia Minks DILATION;  Surgeon: Daneil Dolin, MD;  Location: [facility];  Service: Endoscopy;  Laterality: N/A;  Maloney dilator # 49  .  Colonoscopy with propofol N/A 05/20/2015    RMR: Pancolonic diverticulosis. Redundant colon   . Cataract extraction w/phaco Left 01/04/2016    Procedure: CATARACT EXTRACTION PHACO AND INTRAOCULAR LENS PLACEMENT (IOC);  Surgeon: Rutherford Guys, MD;  Location: [facility];  Service: Ophthalmology;  Laterality: Left;  CDE:4.70  . Cataract extraction w/phaco Right 01/18/2016    Procedure: CATARACT EXTRACTION PHACO AND INTRAOCULAR LENS PLACEMENT RIGHT EYE; CDE:  4.66;  Surgeon: Rutherford Guys, MD;  Location: [facility];  Service: Ophthalmology;  Laterality: Right;   Family History: family history includes ADD / ADHD in her grandchild and grandchild; Alcohol abuse in her brother, brother, brother, brother, brother, father, and mother; Anxiety disorder in her brother, brother, brother, brother, brother, father, and mother; Bipolar disorder in her sister; Dementia in her paternal aunt; Drug abuse in her brother and brother; Seizures in her brother and brother. There is no history of Depression, OCD, Paranoid behavior, Schizophrenia, Sexual abuse, Physical abuse, or Colon cancer.   Psychosocial history Patient has been married for 16 years. She has 2 children. She is married however her  relationship is very tense. She has history of leaving her husband in the past.  Currently she lives by herself.  Mental status examination Patient is mildly obese female who is casually dressed and fairly groomed.  .  She is cooperative and maintained fair eye contact her mood is A little low and her affect is subdued She denies suicidal ideation . Her speech is clear and coherent. Her attention and concentration is fair. . She's alert and oriented x3. There are no psychotic symptoms present at this time. She denies any auditory or visual hallucination. Her insight judgment is fair and her impulse control is okay.  Lab Results:  Results for orders placed or performed during the hospital encounter of 12/31/15 (from the past 8736 hour(s))  Surgical pcr screen   Collection Time: 12/31/15 10:57 AM  Result Value Ref Range   MRSA, PCR NEGATIVE NEGATIVE   Staphylococcus aureus NEGATIVE NEGATIVE  CBC   Collection Time: 12/31/15 10:57 AM  Result Value Ref Range   WBC 4.7 4.0 - 10.5 K/uL   RBC 4.41 3.87 - 5.11 MIL/uL   Hemoglobin 12.6 12.0 - 15.0 g/dL   HCT 38.6 36.0 - 46.0 %   MCV 87.5 78.0 - 100.0 fL   MCH 28.6 26.0 - 34.0 pg   MCHC 32.6 30.0 - 36.0 g/dL   RDW 14.1 11.5 - 15.5 %   Platelets 153 150 - 400 K/uL  Basic metabolic panel   Collection Time: 12/31/15 10:57 AM  Result Value Ref Range   Sodium 142 135 - 145 mmol/L   Potassium 3.9 3.5 - 5.1 mmol/L   Chloride 107 101 - 111 mmol/L   CO2 26 22 - 32 mmol/L   Glucose, Bld 105 (H) 65 - 99 mg/dL   BUN 14 6 - 20 mg/dL   Creatinine, Ser 0.91 0.44 - 1.00 mg/dL   Calcium 8.8 (L) 8.9 - 10.3 mg/dL   GFR calc non Af Amer >60 >60 mL/min   GFR calc Af Amer >60 >60 mL/min   Anion gap 9 5 - 15  Results for orders placed or performed in visit on 08/09/15 (from the past 8736 hour(s))  CBC with Differential/Platelet   Collection Time: 08/23/15  3:17 PM  Result Value Ref Range   WBC 4.9 4.0 - 10.5 K/uL   RBC 4.58 3.87 - 5.11 MIL/uL   Hemoglobin  12.4 12.0 - 15.0 g/dL  HCT 37.4 36.0 - 46.0 %   MCV 81.7 78.0 - 100.0 fL   MCH 27.1 26.0 - 34.0 pg   MCHC 33.2 30.0 - 36.0 g/dL   RDW 14.7 11.5 - 15.5 %   Platelets 192 150 - 400 K/uL   MPV 11.4 8.6 - 12.4 fL   Neutrophils Relative % 37 (L) 43 - 77 %   Neutro Abs 1.8 1.7 - 7.7 K/uL   Lymphocytes Relative 54 (H) 12 - 46 %   Lymphs Abs 2.6 0.7 - 4.0 K/uL   Monocytes Relative 8 3 - 12 %   Monocytes Absolute 0.4 0.1 - 1.0 K/uL   Eosinophils Relative 1 0 - 5 %   Eosinophils Absolute 0.0 0.0 - 0.7 K/uL   Basophils Relative 0 0 - 1 %   Basophils Absolute 0.0 0.0 - 0.1 K/uL   Smear Review Criteria for review not met   Comprehensive metabolic panel   Collection Time: 08/23/15  3:17 PM  Result Value Ref Range   Sodium 139 135 - 146 mmol/L   Potassium 4.4 3.5 - 5.3 mmol/L   Chloride 103 98 - 110 mmol/L   CO2 29 20 - 31 mmol/L   Glucose, Bld 82 65 - 99 mg/dL   BUN 11 7 - 25 mg/dL   Creat 0.76 0.50 - 0.99 mg/dL   Total Bilirubin 0.6 0.2 - 1.2 mg/dL   Alkaline Phosphatase 65 33 - 130 U/L   AST 13 10 - 35 U/L   ALT 8 6 - 29 U/L   Total Protein 6.5 6.1 - 8.1 g/dL   Albumin 3.4 (L) 3.6 - 5.1 g/dL   Calcium 8.8 8.6 - 10.4 mg/dL  Lipase   Collection Time: 08/23/15  3:17 PM  Result Value Ref Range   Lipase 11 7 - 60 U/L  Results for orders placed or performed during the hospital encounter of 05/19/15 (from the past 8736 hour(s))  Surgical pcr screen   Collection Time: 05/19/15  2:15 PM  Result Value Ref Range   MRSA, PCR NEGATIVE NEGATIVE   Staphylococcus aureus NEGATIVE NEGATIVE  CBC with Differential/Platelet   Collection Time: 05/19/15  2:15 PM  Result Value Ref Range   WBC 6.7 4.0 - 10.5 K/uL   RBC 4.67 3.87 - 5.11 MIL/uL   Hemoglobin 12.7 12.0 - 15.0 g/dL   HCT 39.2 36.0 - 46.0 %   MCV 83.9 78.0 - 100.0 fL   MCH 27.2 26.0 - 34.0 pg   MCHC 32.4 30.0 - 36.0 g/dL   RDW 12.7 11.5 - 15.5 %   Platelets 183 150 - 400 K/uL   Neutrophils Relative % 41 (L) 43 - 77 %   Neutro Abs  2.8 1.7 - 7.7 K/uL   Lymphocytes Relative 48 (H) 12 - 46 %   Lymphs Abs 3.2 0.7 - 4.0 K/uL   Monocytes Relative 10 3 - 12 %   Monocytes Absolute 0.7 0.1 - 1.0 K/uL   Eosinophils Relative 1 0 - 5 %   Eosinophils Absolute 0.0 0.0 - 0.7 K/uL   Basophils Relative 0 0 - 1 %   Basophils Absolute 0.0 0.0 - 0.1 K/uL  Basic metabolic panel   Collection Time: 05/19/15  2:15 PM  Result Value Ref Range   Sodium 142 135 - 145 mmol/L   Potassium 3.6 3.5 - 5.1 mmol/L   Chloride 105 101 - 111 mmol/L   CO2 30 22 - 32 mmol/L   Glucose, Bld 82 65 -  99 mg/dL   BUN 18 6 - 20 mg/dL   Creatinine, Ser 0.92 0.44 - 1.00 mg/dL   Calcium 8.7 (L) 8.9 - 10.3 mg/dL   GFR calc non Af Amer >60 >60 mL/min   GFR calc Af Amer >60 >60 mL/min   Anion gap 7 5 - 15   Assessment Axis I bipolar disorder NOS, benzodiazepine abuse, alcohol abuse Axis II deferred Axis III see medical history Axis IV moderate Axis V 60-65  Plan/Discussion: She'll Discontinue trazodone and start Ambien 10 mg at bedtime for sleep,, continue Xanax for anxiety  0.5 mg in the morning and 1 mg at bedtime and adding other 0.5 mg in the afternoon and Depakote for mood disorder. She will start Lexapro 20 mg daily for depression She'll return to see me in 6 weeks. She will be scheduled with a counselor here Time spent 15 minutes.  More than 50% of the time spent in psychoeducation, counseling and coordination of care.  Discuss safety plan that anytime having active suicidal thoughts or homicidal thoughts then patient need to call 911 or go to the local emergency room.  MEDICATIONS this encounter: Meds ordered this encounter  Medications  . divalproex (DEPAKOTE) 250 MG DR tablet    Sig: Take 3 tablets (750 mg total) by mouth at bedtime.    Dispense:  90 tablet    Refill:  2  . escitalopram (LEXAPRO) 20 MG tablet    Sig: Take 1 tablet (20 mg total) by mouth daily.    Dispense:  30 tablet    Refill:  2  . ALPRAZolam (XANAX) 0.5 MG tablet     Sig: Take one twice a day and 2 at bedtime    Dispense:  120 tablet    Refill:  2  . zolpidem (AMBIEN) 10 MG tablet    Sig: Take 1 tablet (10 mg total) by mouth at bedtime as needed for sleep.    Dispense:  30 tablet    Refill:  2    Medical Decision Making Problem Points:  Established problem, stable/improving (1), Established problem, worsening (2), New problem, with no additional work-up planned (3), Review of last therapy session (1) and Review of psycho-social stressors (1) Data Points:  Review or order clinical lab tests (1) Review and summation of old records (2) Review of medication regiment & side effects (2) Review of new medications or change in dosage (2)  ROSS, DEBORAH, MD

## 2016-03-13 DIAGNOSIS — M5442 Lumbago with sciatica, left side: Secondary | ICD-10-CM | POA: Diagnosis not present

## 2016-03-13 DIAGNOSIS — G8929 Other chronic pain: Secondary | ICD-10-CM | POA: Diagnosis not present

## 2016-03-24 ENCOUNTER — Ambulatory Visit (HOSPITAL_COMMUNITY): Payer: Self-pay | Admitting: Psychology

## 2016-03-27 ENCOUNTER — Telehealth: Payer: Self-pay | Admitting: Orthopedic Surgery

## 2016-03-27 ENCOUNTER — Other Ambulatory Visit: Payer: Self-pay | Admitting: *Deleted

## 2016-03-27 ENCOUNTER — Other Ambulatory Visit: Payer: Self-pay | Admitting: Orthopedic Surgery

## 2016-03-27 DIAGNOSIS — M1711 Unilateral primary osteoarthritis, right knee: Secondary | ICD-10-CM

## 2016-03-27 MED ORDER — OXYCODONE-ACETAMINOPHEN 7.5-325 MG PO TABS: 1.0000 | ORAL_TABLET | Freq: Four times a day (QID) | ORAL | Status: DC | PRN

## 2016-03-27 NOTE — Telephone Encounter (Signed)
Prescription available 

## 2016-03-27 NOTE — Telephone Encounter (Signed)
Patient called to request refill of medication:  oxyCODONE-acetaminophen (PERCOCET) 7.5-325 MG tablet [[id number]][[id number]]

## 2016-03-29 NOTE — Telephone Encounter (Signed)
Patient picked up prescription today, 03/29/16; called back asking why dosage is every 6 hours?

## 2016-03-29 NOTE — Telephone Encounter (Signed)
Called back to relay - unable to reach at # in chart ([phone]) [phone].

## 2016-03-29 NOTE — Telephone Encounter (Signed)
That's what it has always been

## 2016-04-10 DIAGNOSIS — M5442 Lumbago with sciatica, left side: Secondary | ICD-10-CM | POA: Diagnosis not present

## 2016-04-12 ENCOUNTER — Other Ambulatory Visit: Payer: Self-pay | Admitting: *Deleted

## 2016-04-12 ENCOUNTER — Telehealth: Payer: Self-pay | Admitting: Orthopedic Surgery

## 2016-04-12 DIAGNOSIS — M1711 Unilateral primary osteoarthritis, right knee: Secondary | ICD-10-CM

## 2016-04-12 MED ORDER — OXYCODONE-ACETAMINOPHEN 7.5-325 MG PO TABS: 1.0000 | ORAL_TABLET | Freq: Four times a day (QID) | ORAL | 0 refills | Status: DC | PRN

## 2016-04-12 NOTE — Telephone Encounter (Signed)
RX DONE

## 2016-04-12 NOTE — Telephone Encounter (Signed)
Patient called and requested a refill on Oxycodone/Acetaminophen 7.5-325 mgs.  Qty 56  Sig: Take 1 tablet by mouth every 6 (six) hours as needed.

## 2016-04-19 ENCOUNTER — Encounter (HOSPITAL_COMMUNITY): Payer: Self-pay | Admitting: Psychiatry

## 2016-04-19 ENCOUNTER — Ambulatory Visit (INDEPENDENT_AMBULATORY_CARE_PROVIDER_SITE_OTHER): Payer: PPO | Admitting: Psychiatry

## 2016-04-19 VITALS — BP 117/64 | HR 84 | Ht 63.0 in | Wt 212.6 lb

## 2016-04-19 DIAGNOSIS — F489 Nonpsychotic mental disorder, unspecified: Secondary | ICD-10-CM

## 2016-04-19 DIAGNOSIS — F5105 Insomnia due to other mental disorder: Secondary | ICD-10-CM

## 2016-04-19 DIAGNOSIS — F3130 Bipolar disorder, current episode depressed, mild or moderate severity, unspecified: Secondary | ICD-10-CM | POA: Diagnosis not present

## 2016-04-19 MED ORDER — ZOLPIDEM TARTRATE 10 MG PO TABS: 10.0000 mg | ORAL_TABLET | Freq: Every evening | ORAL | 2 refills | Status: DC | PRN

## 2016-04-19 MED ORDER — DIVALPROEX SODIUM 250 MG PO DR TAB: 750.0000 mg | DELAYED_RELEASE_TABLET | Freq: Every day | ORAL | 2 refills | Status: DC

## 2016-04-19 MED ORDER — ALPRAZOLAM 0.5 MG PO TABS: ORAL_TABLET | ORAL | 2 refills | Status: DC

## 2016-04-19 MED ORDER — ESCITALOPRAM OXALATE 20 MG PO TABS: 20.0000 mg | ORAL_TABLET | Freq: Every day | ORAL | 2 refills | Status: DC

## 2016-04-19 NOTE — Progress Notes (Signed)
Patient ID: LAPORSCHE HOEGER, female   DOB: [birth year], 65 y.o.   MRN: [id number] Patient ID: KYESHIA ZINN, female   DOB: 1951-02-17, 65 y.o.   MRN: [id number] Patient ID: EVELEIGH CRUMPLER, female   DOB: Jul 13, 1951, 65 y.o.   MRN: [id number] Patient ID: ELLIONNA BUCKBEE, female   DOB: [birth year], 65 y.o.   MRN: [id number] Patient ID: TALYIA ALLENDE, female   DOB: 1951/02/18, 65 y.o.   MRN: [id number] Patient ID: ESSIE LAGUNES, female   DOB: [birth year], 65 y.o.   MRN: [id number] Patient ID: MEKIA DIPINTO, female   DOB: 1951-07-14, 65 y.o.   MRN: [id number] Patient ID: ROSELLE NORTON, female   DOB: [birth year], 65 y.o.   MRN: [id number] Patient ID: LANYAH SPENGLER, female   DOB: Mar 08, 1951, 65 y.o.   MRN: [id number] Patient ID: RAKEISHA NYCE, female   DOB: 01/04/1951, 65 y.o.   MRN: [id number] Patient ID: ARWEN HASELEY, female   DOB: [birth year], 65 y.o.   MRN: [id number] [facility] [id number] Progress Note NYILAH KIGHT MRN: [id number] DOB: 1950-10-06 Age: 65 y.o.  Date: 04/19/2016  No chief complaint on file.   History of present illness Patient is 65 year old [place] American female who came for her followup appointment. She is currently with her husband in [place] and is on disability  The patient was last seen in March. At that time she told me she was moving to [place] to be with her daughter. She didn't like living down there and she and her grandson have come back. Her grandson got his own place to live and she is now staying in a motel temporarily. Her mood is "up and down" because she is unsure where she is going to live. She's in a lot of pain in her knee and would like to get back on the amitriptyline since she ran out. A nurse practitioner and primary care put her on Celexa without realizing that she's already on Cymbalta. I explained to her that made no sense to be on both. She does admit that she still drinks periodically sometimes too much on weekends. I warned her against this. She  denies being severely depressed or suicidal  The patient returns after 6 weeks. She states that since we added the Lexapro her mood is pretty good. She's also taking the Ambien and she is sleeping better. However she states that she has no energy to get up and do anything through the day. She's not had her thyroid checked in a while and I suggested she go back to [place]. I also pointed out that she's on several sedating medications including Percocet Flexeril Xanax and Ambien. She admits that she still drinks one beer a day at night and I told her she is not to do this and to take Ambien.    Past psychiatric history Patient has history of one psychiatric admission in 2000 needed behavioral [place]. She took overdose on her pills she was also intoxicated with alcohol. Patient has also seen by psychiatrist in 2004. At that time she was seeking more benzodiazepines and pain medication.  Vitals: BP 117/64 (BP Location: Right Arm, Patient Position: Sitting, Cuff Size: Large)   Pulse 84   Ht '5\' 3"'  (1.6 m)   Wt 212 lb 9.6 oz (96.4 kg)   BMI 37.66 kg/m   Allergies:  Medical History: Past Medical History:  Diagnosis Date  . Allergic rhinitis   . Anxiety   . Arthritis   .  Cataracts, bilateral   . Chronic back pain    Sees pain mamagement clinic  . Depression   . GERD (gastroesophageal reflux disease)   . Headaches, cluster 3 yrs ago   last one  . High cholesterol   . Hypertension   . Hypothyroid   . Immature cataract   . Mania ([place])   . PTSD (post-traumatic stress disorder)   . Seizures ([place]) 20 yrs ago   unknown etiology-on meds  . Sleep apnea    STOP BANG score=4  . 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Patient has history of hypertension, allergic rhinitis, hypothyroidism, chronic back pain, hyperlipidemia and obesity.  She most recently had surgery on her right knee.  Surgical History: Past Surgical History:  Procedure Laterality Date  . BACK SURGERY    . CATARACT  EXTRACTION W/PHACO Left 01/04/2016   Procedure: CATARACT EXTRACTION PHACO AND INTRAOCULAR LENS PLACEMENT (IOC);  Surgeon: Rutherford Guys, MD;  Location: [facility];  Service: Ophthalmology;  Laterality: Left;  CDE:4.70  . CATARACT EXTRACTION W/PHACO Right 01/18/2016   Procedure: CATARACT EXTRACTION PHACO AND INTRAOCULAR LENS PLACEMENT RIGHT EYE; CDE:  4.66;  Surgeon: Rutherford Guys, MD;  Location: [facility];  Service: Ophthalmology;  Laterality: Right;  . CHOLECYSTECTOMY    . COLONOSCOPY  10/16/2008   BLT:[id number], otherwise normal rectum; pancolonic diverticula the remainder of the colonic mucosa appeared normal. Next TCS due 10/2013.  Marland Kitchen COLONOSCOPY  06/28/2006   RMR: Internal hemorrhoids. Diminutive rectal polyps, cold biopsied/ Pancolonic diverticula. Polyps in the right colon removed   . COLONOSCOPY WITH PROPOFOL N/A 05/20/2015   RMR: Pancolonic diverticulosis. Redundant colon   . ESOPHAGOGASTRODUODENOSCOPY  10/16/2008   TMA:UQJFH hiatal hernia otherwise normal esophagus, stomach  D1, D2, status post passage of a 56-French Maloney dilator  . ESOPHAGOGASTRODUODENOSCOPY  06/28/2006   LKT:GYBWLS esophagus. Small hiatal hernia. Otherwise normal stomach, D1 and D2, status post passage of a 56 [place] Maloney dilator  . ESOPHAGOGASTRODUODENOSCOPY (EGD) WITH ESOPHAGEAL DILATION N/A 07/14/2013   RMR: Abnormal esophagus of uncertain significance-status post esophageal biospy. small hiatal hernia.   Marland Kitchen ESOPHAGOGASTRODUODENOSCOPY (EGD) WITH PROPOFOL N/A 05/20/2015   RMR: NORMAL EGD status post maloney dilation   . FOOT SURGERY     left foot-  . INCISION AND DRAINAGE Right 01/03/2013   Procedure: INCISION AND DRAINAGE;  Surgeon: Carole Civil, MD;  Location: [facility];  Service: Orthopedics;  Laterality: Right;  . KNEE ARTHROSCOPY  01/2012   right  . KNEE ARTHROSCOPY WITH LATERAL RELEASE Right 01/03/2013   Procedure: Lateral Release Patella Right Knee;  Surgeon: Carole Civil, MD;  Location: [facility];  Service:  Orthopedics;  Laterality: Right;  . left foot  x 2  . LUMBAR DISC SURGERY  L4-5  . MALONEY DILATION N/A 05/20/2015   Procedure: Venia Minks DILATION;  Surgeon: Daneil Dolin, MD;  Location: [facility];  Service: Endoscopy;  Laterality: N/A;  Maloney dilator # 74  . oophoectomy  bilateral  . PARTIAL HYSTERECTOMY    . TOTAL KNEE ARTHROPLASTY  07/01/2012   Procedure: TOTAL KNEE ARTHROPLASTY;  Surgeon: Carole Civil, MD;  Location: [facility];  Service: Orthopedics;  Laterality: Right;  . TOTAL KNEE REVISION Right 01/03/2013   Procedure: Patellaplasty Right Knee;  Surgeon: Carole Civil, MD;  Location: [facility];  Service: Orthopedics;  Laterality: Right;   Family History: family history includes ADD / ADHD in her grandchild and grandchild; Alcohol abuse in her brother, brother, brother, brother, brother, father, and mother; Anxiety disorder in  her brother, brother, brother, brother, brother, father, and mother; Bipolar disorder in her sister; Dementia in her paternal aunt; Drug abuse in her brother and brother; Seizures in her brother and brother.   Psychosocial history Patient has been married for 16 years. She has 2 children. She is married however her relationship is very tense. She has history of leaving her husband in the past.  Currently she lives by herself.  Mental status examination Patient is mildly obese female who is casually dressed and fairly groomed.  .  She is cooperative and maintained fair eye contact her mood is Fairly good and she states that she is feeling better in terms of depression. Her affect is still a bit constricted and tired She denies suicidal ideation . Her speech is clear and coherent. Her attention and concentration is fair. . She's alert and oriented x3. There are no psychotic symptoms present at this time. She denies any auditory or visual hallucination. Her insight judgment is fair and her impulse control is okay.  Lab Results:  Results for orders placed or performed  during the hospital encounter of 12/31/15 (from the past 8736 hour(s))  Surgical pcr screen   Collection Time: 12/31/15 10:57 AM  Result Value Ref Range   MRSA, PCR NEGATIVE NEGATIVE   Staphylococcus aureus NEGATIVE NEGATIVE  CBC   Collection Time: 12/31/15 10:57 AM  Result Value Ref Range   WBC 4.7 4.0 - 10.5 K/uL   RBC 4.41 3.87 - 5.11 MIL/uL   Hemoglobin 12.6 12.0 - 15.0 g/dL   HCT 38.6 36.0 - 46.0 %   MCV 87.5 78.0 - 100.0 fL   MCH 28.6 26.0 - 34.0 pg   MCHC 32.6 30.0 - 36.0 g/dL   RDW 14.1 11.5 - 15.5 %   Platelets 153 150 - 400 K/uL  Basic metabolic panel   Collection Time: 12/31/15 10:57 AM  Result Value Ref Range   Sodium 142 135 - 145 mmol/L   Potassium 3.9 3.5 - 5.1 mmol/L   Chloride 107 101 - 111 mmol/L   CO2 26 22 - 32 mmol/L   Glucose, Bld 105 (H) 65 - 99 mg/dL   BUN 14 6 - 20 mg/dL   Creatinine, Ser 0.91 0.44 - 1.00 mg/dL   Calcium 8.8 (L) 8.9 - 10.3 mg/dL   GFR calc non Af Amer >60 >60 mL/min   GFR calc Af Amer >60 >60 mL/min   Anion gap 9 5 - 15  Results for orders placed or performed in visit on 08/09/15 (from the past 8736 hour(s))  CBC with Differential/Platelet   Collection Time: 08/23/15  3:17 PM  Result Value Ref Range   WBC 4.9 4.0 - 10.5 K/uL   RBC 4.58 3.87 - 5.11 MIL/uL   Hemoglobin 12.4 12.0 - 15.0 g/dL   HCT 37.4 36.0 - 46.0 %   MCV 81.7 78.0 - 100.0 fL   MCH 27.1 26.0 - 34.0 pg   MCHC 33.2 30.0 - 36.0 g/dL   RDW 14.7 11.5 - 15.5 %   Platelets 192 150 - 400 K/uL   MPV 11.4 8.6 - 12.4 fL   Neutrophils Relative % 37 (L) 43 - 77 %   Neutro Abs 1.8 1.7 - 7.7 K/uL   Lymphocytes Relative 54 (H) 12 - 46 %   Lymphs Abs 2.6 0.7 - 4.0 K/uL   Monocytes Relative 8 3 - 12 %   Monocytes Absolute 0.4 0.1 - 1.0 K/uL   Eosinophils Relative 1 0 - 5 %  Eosinophils Absolute 0.0 0.0 - 0.7 K/uL   Basophils Relative 0 0 - 1 %   Basophils Absolute 0.0 0.0 - 0.1 K/uL   Smear Review Criteria for review not met   Comprehensive metabolic panel   Collection  Time: 08/23/15  3:17 PM  Result Value Ref Range   Sodium 139 135 - 146 mmol/L   Potassium 4.4 3.5 - 5.3 mmol/L   Chloride 103 98 - 110 mmol/L   CO2 29 20 - 31 mmol/L   Glucose, Bld 82 65 - 99 mg/dL   BUN 11 7 - 25 mg/dL   Creat 0.76 0.50 - 0.99 mg/dL   Total Bilirubin 0.6 0.2 - 1.2 mg/dL   Alkaline Phosphatase 65 33 - 130 U/L   AST 13 10 - 35 U/L   ALT 8 6 - 29 U/L   Total Protein 6.5 6.1 - 8.1 g/dL   Albumin 3.4 (L) 3.6 - 5.1 g/dL   Calcium 8.8 8.6 - 10.4 mg/dL  Lipase   Collection Time: 08/23/15  3:17 PM  Result Value Ref Range   Lipase 11 7 - 60 U/L  Results for orders placed or performed during the hospital encounter of 05/19/15 (from the past 8736 hour(s))  Surgical pcr screen   Collection Time: 05/19/15  2:15 PM  Result Value Ref Range   MRSA, PCR NEGATIVE NEGATIVE   Staphylococcus aureus NEGATIVE NEGATIVE  CBC with Differential/Platelet   Collection Time: 05/19/15  2:15 PM  Result Value Ref Range   WBC 6.7 4.0 - 10.5 K/uL   RBC 4.67 3.87 - 5.11 MIL/uL   Hemoglobin 12.7 12.0 - 15.0 g/dL   HCT 39.2 36.0 - 46.0 %   MCV 83.9 78.0 - 100.0 fL   MCH 27.2 26.0 - 34.0 pg   MCHC 32.4 30.0 - 36.0 g/dL   RDW 12.7 11.5 - 15.5 %   Platelets 183 150 - 400 K/uL   Neutrophils Relative % 41 (L) 43 - 77 %   Neutro Abs 2.8 1.7 - 7.7 K/uL   Lymphocytes Relative 48 (H) 12 - 46 %   Lymphs Abs 3.2 0.7 - 4.0 K/uL   Monocytes Relative 10 3 - 12 %   Monocytes Absolute 0.7 0.1 - 1.0 K/uL   Eosinophils Relative 1 0 - 5 %   Eosinophils Absolute 0.0 0.0 - 0.7 K/uL   Basophils Relative 0 0 - 1 %   Basophils Absolute 0.0 0.0 - 0.1 K/uL  Basic metabolic panel   Collection Time: 05/19/15  2:15 PM  Result Value Ref Range   Sodium 142 135 - 145 mmol/L   Potassium 3.6 3.5 - 5.1 mmol/L   Chloride 105 101 - 111 mmol/L   CO2 30 22 - 32 mmol/L   Glucose, Bld 82 65 - 99 mg/dL   BUN 18 6 - 20 mg/dL   Creatinine, Ser 0.92 0.44 - 1.00 mg/dL   Calcium 8.7 (L) 8.9 - 10.3 mg/dL   GFR calc non Af  Amer >60 >60 mL/min   GFR calc Af Amer >60 >60 mL/min   Anion gap 7 5 - 15   Assessment Axis I bipolar disorder NOS, benzodiazepine abuse, alcohol abuse Axis II deferred Axis III see medical history Axis IV moderate Axis V 60-65  Plan/Discussion: She'll Continue Ambien 10 mg at bedtime for sleep,, continue Xanax for anxiety  0.5 mg in the morning and 1 mg at bedtime and adding other 0.5 mg in the afternoon and Depakote for mood disorder. She  will continue Lexapro 20 mg daily for depression She'll return to see me in 3 months. We have tried to schedule her for counseling here but she is not consistent in follow-up. She has been warned to stop drinking Time spent 15 minutes.  More than 50% of the time spent in psychoeducation, counseling and coordination of care.  Discuss safety plan that anytime having active suicidal thoughts or homicidal thoughts then patient need to call 911 or go to the local emergency room.  MEDICATIONS this encounter: Meds ordered this encounter  Medications  . escitalopram (LEXAPRO) 20 MG tablet    Sig: Take 1 tablet (20 mg total) by mouth daily.    Dispense:  30 tablet    Refill:  2  . divalproex (DEPAKOTE) 250 MG DR tablet    Sig: Take 3 tablets (750 mg total) by mouth at bedtime.    Dispense:  90 tablet    Refill:  2  . ALPRAZolam (XANAX) 0.5 MG tablet    Sig: Take one twice a day and 2 at bedtime    Dispense:  120 tablet    Refill:  2  . zolpidem (AMBIEN) 10 MG tablet    Sig: Take 1 tablet (10 mg total) by mouth at bedtime as needed for sleep.    Dispense:  30 tablet    Refill:  2    Medical Decision Making Problem Points:  Established problem, stable/improving (1), Established problem, worsening (2), New problem, with no additional work-up planned (3), Review of last therapy session (1) and Review of psycho-social stressors (1) Data Points:  Review or order clinical lab tests (1) Review and summation of old records (2) Review of medication regiment  & side effects (2) Review of new medications or change in dosage (2)  Kasper Mudrick, MDPatient ID: [name], female   DOB: 07/18/1951, 65 y.o.   MRN: [id number]

## 2016-04-26 ENCOUNTER — Other Ambulatory Visit: Payer: Self-pay | Admitting: *Deleted

## 2016-04-26 ENCOUNTER — Telehealth: Payer: Self-pay | Admitting: Orthopedic Surgery

## 2016-04-26 DIAGNOSIS — M1711 Unilateral primary osteoarthritis, right knee: Secondary | ICD-10-CM

## 2016-04-26 MED ORDER — OXYCODONE-ACETAMINOPHEN 7.5-325 MG PO TABS: 1.0000 | ORAL_TABLET | Freq: Four times a day (QID) | ORAL | 0 refills | Status: DC | PRN

## 2016-04-26 NOTE — Telephone Encounter (Signed)
Routing to Dr Harrison for approval 

## 2016-04-26 NOTE — Telephone Encounter (Signed)
Patient called and requested a refill on Oxycodone/Acetaminophen (Percocet) 7.5-325 mgs.  Qty  56  Sig: Take 1 tablet by mouth every 6 (six) hours as needed.

## 2016-04-26 NOTE — Telephone Encounter (Signed)
yes

## 2016-04-27 NOTE — Telephone Encounter (Signed)
PRESCRIPTION AVAILABLE 

## 2016-05-09 DIAGNOSIS — M5442 Lumbago with sciatica, left side: Secondary | ICD-10-CM | POA: Diagnosis not present

## 2016-05-10 ENCOUNTER — Telehealth: Payer: Self-pay | Admitting: Orthopedic Surgery

## 2016-05-10 ENCOUNTER — Other Ambulatory Visit: Payer: Self-pay | Admitting: *Deleted

## 2016-05-10 DIAGNOSIS — M1711 Unilateral primary osteoarthritis, right knee: Secondary | ICD-10-CM

## 2016-05-10 MED ORDER — OXYCODONE-ACETAMINOPHEN 7.5-325 MG PO TABS: 1.0000 | ORAL_TABLET | Freq: Four times a day (QID) | ORAL | 0 refills | Status: DC | PRN

## 2016-05-10 NOTE — Telephone Encounter (Signed)
ROUTING TO DR HARRISON FOR APPROVAL 

## 2016-05-10 NOTE — Telephone Encounter (Signed)
refill 

## 2016-05-10 NOTE — Telephone Encounter (Signed)
Patient called to request refill:  oxyCODONE-acetaminophen (PERCOCET) 7.5-325  56 tablet 0 04/26/2016   - please advise, as patient was also referred to [facility]; report is in Dr's box today, 05/10/16/

## 2016-05-24 ENCOUNTER — Telehealth: Payer: Self-pay | Admitting: Orthopedic Surgery

## 2016-05-24 NOTE — Telephone Encounter (Signed)
Oxycodone-Acetaminophen 7.5/325mg   Qty 56 Tablets  Take 1 tablet by mouth every 6 (six) hours as needed.

## 2016-05-24 NOTE — Telephone Encounter (Signed)
Routing to Dr Harrison for approval 

## 2016-05-25 ENCOUNTER — Other Ambulatory Visit: Payer: Self-pay | Admitting: *Deleted

## 2016-05-25 DIAGNOSIS — M1711 Unilateral primary osteoarthritis, right knee: Secondary | ICD-10-CM

## 2016-05-25 MED ORDER — OXYCODONE-ACETAMINOPHEN 7.5-325 MG PO TABS: 1.0000 | ORAL_TABLET | Freq: Four times a day (QID) | ORAL | 0 refills | Status: DC | PRN

## 2016-05-25 NOTE — Telephone Encounter (Signed)
approved

## 2016-05-25 NOTE — Telephone Encounter (Signed)
refill 

## 2016-06-06 ENCOUNTER — Other Ambulatory Visit: Payer: Self-pay | Admitting: *Deleted

## 2016-06-06 ENCOUNTER — Telehealth: Payer: Self-pay | Admitting: Orthopedic Surgery

## 2016-06-06 DIAGNOSIS — M1711 Unilateral primary osteoarthritis, right knee: Secondary | ICD-10-CM

## 2016-06-06 MED ORDER — OXYCODONE-ACETAMINOPHEN 7.5-325 MG PO TABS: 1.0000 | ORAL_TABLET | Freq: Four times a day (QID) | ORAL | 0 refills | Status: DC | PRN

## 2016-06-06 NOTE — Telephone Encounter (Signed)
REFILL 

## 2016-06-06 NOTE — Telephone Encounter (Signed)
Patient called for refill:  °oxyCODONE-acetaminophen (PERCOCET) 7.5-325 MG tablet 56 tablet  ° ° °

## 2016-06-06 NOTE — Telephone Encounter (Signed)
ROUTING TO DR HARRISON FOR APPROVAL 

## 2016-06-08 ENCOUNTER — Other Ambulatory Visit: Payer: Self-pay | Admitting: Nurse Practitioner

## 2016-06-14 ENCOUNTER — Ambulatory Visit: Payer: Self-pay | Admitting: Gastroenterology

## 2016-06-29 ENCOUNTER — Telehealth: Payer: Self-pay | Admitting: Orthopedic Surgery

## 2016-06-29 ENCOUNTER — Other Ambulatory Visit: Payer: Self-pay | Admitting: *Deleted

## 2016-06-29 DIAGNOSIS — M1711 Unilateral primary osteoarthritis, right knee: Secondary | ICD-10-CM

## 2016-06-29 MED ORDER — OXYCODONE-ACETAMINOPHEN 7.5-325 MG PO TABS: 1.0000 | ORAL_TABLET | Freq: Four times a day (QID) | ORAL | 0 refills | Status: DC | PRN

## 2016-06-29 NOTE — Telephone Encounter (Signed)
Routing to Dr Harrison for approval 

## 2016-06-29 NOTE — Telephone Encounter (Signed)
Oxycodone-Acetaminophen 7.5/325mg   Qty  56 Tablets  Take 1 tablet by mouth every 6 (six) hours as needed.

## 2016-06-29 NOTE — Telephone Encounter (Signed)
APPROVED

## 2016-07-03 ENCOUNTER — Encounter: Payer: Self-pay | Admitting: Gastroenterology

## 2016-07-03 ENCOUNTER — Ambulatory Visit (INDEPENDENT_AMBULATORY_CARE_PROVIDER_SITE_OTHER): Payer: PPO | Admitting: Gastroenterology

## 2016-07-03 VITALS — BP 132/75 | HR 72 | Temp 98.0°F | Ht 63.0 in | Wt 210.2 lb

## 2016-07-03 DIAGNOSIS — K219 Gastro-esophageal reflux disease without esophagitis: Secondary | ICD-10-CM

## 2016-07-03 DIAGNOSIS — K5900 Constipation, unspecified: Secondary | ICD-10-CM

## 2016-07-03 NOTE — Progress Notes (Signed)
cc'ed to pcp °

## 2016-07-03 NOTE — Assessment & Plan Note (Signed)
Doing well on Amitiza 24 g twice a day. Return to the office in one year or call sooner if needed.

## 2016-07-03 NOTE — Progress Notes (Signed)
Primary Care Physician: Cassell Smiles., MD  Primary Gastroenterologist:  Roetta Sessions, MD   Chief Complaint  Patient presents with  . Follow-up    having some GERD    HPI: Diane [name] is a 65 y.o. female here for follow-up of GERD and constipation. She was last seen in April 2017.Currently taking tonics 40 mg once daily. Taking Amitiza 24 g twice a day. BM regular. No melena, brbpr. Reflux controlled unless eats the wrong things. No dysphagia. No abdominal pain.    Current Outpatient Prescriptions  Medication Sig Dispense Refill  . ALPRAZolam (XANAX) 0.5 MG tablet Take one twice a day and 2 at bedtime 120 tablet 2  . AMITIZA 24 MCG capsule TAKE ONE CAPSULE BY MOUTH TWICE DAILY WITH A MEAL. 60 capsule 3  . amLODipine (NORVASC) 5 MG tablet Take 5 mg by mouth daily.    . divalproex (DEPAKOTE) 250 MG DR tablet Take 3 tablets (750 mg total) by mouth at bedtime. 90 tablet 2  . escitalopram (LEXAPRO) 20 MG tablet Take 1 tablet (20 mg total) by mouth daily. 30 tablet 2  . furosemide (LASIX) 20 MG tablet Take 20 mg by mouth daily.    Marland Kitchen levothyroxine (LEVOTHROID) 150 MCG tablet Take 150 mcg by mouth daily.     Marland Kitchen oxyCODONE-acetaminophen (PERCOCET) 7.5-325 MG tablet Take 1 tablet by mouth every 6 (six) hours as needed. 56 tablet 0  . pantoprazole (PROTONIX) 40 MG tablet Take 1 tablet (40 mg total) by mouth daily. 90 tablet 3  . rosuvastatin (CRESTOR) 40 MG tablet Take 40 mg by mouth daily.      Marland Kitchen zolpidem (AMBIEN) 10 MG tablet Take 1 tablet (10 mg total) by mouth at bedtime as needed for sleep. 30 tablet 2   No current facility-administered medications for this visit.     Allergies as of 07/03/2016 - Review Complete 07/03/2016  Allergen Reaction Noted  . Neomycin-bacitracin zn-polymyx Other (See Comments) 02/27/2008  . Penicillins Hives   . Sulfonamide derivatives Hives and Swelling 02/27/2008    ROS:  General: Negative for anorexia, weight loss, fever, chills, fatigue,  weakness. ENT: Negative for hoarseness, difficulty swallowing , nasal congestion. CV: Negative for chest pain, angina, palpitations, dyspnea on exertion, peripheral edema.  Respiratory: Negative for dyspnea at rest, dyspnea on exertion, cough, sputum, wheezing.  GI: See history of present illness. GU:  Negative for dysuria, hematuria, urinary incontinence, urinary frequency, nocturnal urination.  Endo: Negative for unusual weight change.    Physical Examination:   BP 132/75   Pulse 72   Temp 98 F (36.7 C) (Oral)   Ht 5\' 3"  (1.6 m)   Wt 210 lb 3.2 oz (95.3 kg)   BMI 37.24 kg/m   General: Well-nourished, well-developed in no acute distress.  Eyes: No icterus. Mouth: Oropharyngeal mucosa moist and pink , no lesions erythema or exudate. Lungs: Clear to auscultation bilaterally.  Heart: Regular rate and rhythm, no murmurs rubs or gallops.  Abdomen: Bowel sounds are normal, nontender, nondistended, no hepatosplenomegaly or masses, no abdominal bruits or hernia , no rebound or guarding.   Extremities: No lower extremity edema. No clubbing or deformities. Neuro: Alert and oriented x 4   Skin: Warm and dry, no jaundice.   Psych: Alert and cooperative, normal mood and affect.  Labs:  Lab Results  Component Value Date   WBC 4.7 12/31/2015   HGB 12.6 12/31/2015   HCT 38.6 12/31/2015   MCV 87.5 12/31/2015   PLT  153 12/31/2015   Lab Results  Component Value Date   CREATININE 0.91 12/31/2015   BUN 14 12/31/2015   NA 142 12/31/2015   K 3.9 12/31/2015   CL 107 12/31/2015   CO2 26 12/31/2015    Imaging Studies: No results found.

## 2016-07-03 NOTE — Assessment & Plan Note (Signed)
Currently doing well on Protonix. Breakthrough symptoms with dietary indiscretions. May utilize Tums or Zantac as needed. Continue try to avoid food triggers. Return to the office in one year or call sooner if needed.

## 2016-07-03 NOTE — Patient Instructions (Addendum)
1. You can use TUMS or Zantac as needed for breakthrough heartburn symptoms. Continue to try to avoid food triggers.  2. See you back in one year unless you have problems and then you should call sooner.   Food Choices for Gastroesophageal Reflux Disease, Adult When you have gastroesophageal reflux disease (GERD), the foods you eat and your eating habits are very important. Choosing the right foods can help ease the discomfort of GERD. WHAT GENERAL GUIDELINES DO I NEED TO FOLLOW?  Choose fruits, vegetables, whole grains, low-fat dairy products, and low-fat meat, fish, and poultry.  Limit fats such as oils, salad dressings, butter, nuts, and avocado.  Keep a food diary to identify foods that cause symptoms.  Avoid foods that cause reflux. These may be different for different people.  Eat frequent small meals instead of three large meals each day.  Eat your meals slowly, in a relaxed setting.  Limit fried foods.  Cook foods using methods other than frying.  Avoid drinking alcohol.  Avoid drinking large amounts of liquids with your meals.  Avoid bending over or lying down until 2-3 hours after eating. WHAT FOODS ARE NOT RECOMMENDED? The following are some foods and drinks that may worsen your symptoms: Vegetables Tomatoes. Tomato juice. Tomato and spaghetti sauce. Chili peppers. Onion and garlic. Horseradish. Fruits Oranges, grapefruit, and lemon (fruit and juice). Meats High-fat meats, fish, and poultry. This includes hot dogs, ribs, ham, sausage, salami, and bacon. Dairy Whole milk and chocolate milk. Sour cream. Cream. Butter. Ice cream. Cream cheese.  Beverages Coffee and tea, with or without caffeine. Carbonated beverages or energy drinks. Condiments Hot sauce. Barbecue sauce.  Sweets/Desserts Chocolate and cocoa. Donuts. Peppermint and spearmint. Fats and Oils High-fat foods, including [place] fries and potato chips. Other Vinegar. Strong spices, such as black pepper,  white pepper, red pepper, cayenne, curry powder, cloves, ginger, and chili powder. The items listed above may not be a complete list of foods and beverages to avoid. Contact your dietitian for more information.   This information is not intended to replace advice given to you by your health care provider. Make sure you discuss any questions you have with your health care provider.   Document Released: 08/28/2005 Document Revised: 09/18/2014 Document Reviewed: 07/02/2013 Elsevier Interactive Patient Education [employer]2016 Elsevier Inc.

## 2016-07-04 DIAGNOSIS — M5442 Lumbago with sciatica, left side: Secondary | ICD-10-CM | POA: Diagnosis not present

## 2016-07-13 ENCOUNTER — Ambulatory Visit: Payer: Self-pay | Admitting: Orthopedic Surgery

## 2016-07-14 ENCOUNTER — Other Ambulatory Visit (HOSPITAL_COMMUNITY): Payer: Self-pay | Admitting: Psychiatry

## 2016-07-14 DIAGNOSIS — F3130 Bipolar disorder, current episode depressed, mild or moderate severity, unspecified: Secondary | ICD-10-CM

## 2016-07-17 DIAGNOSIS — Z6836 Body mass index (BMI) 36.0-36.9, adult: Secondary | ICD-10-CM | POA: Diagnosis not present

## 2016-07-17 DIAGNOSIS — F419 Anxiety disorder, unspecified: Secondary | ICD-10-CM | POA: Diagnosis not present

## 2016-07-17 DIAGNOSIS — E6609 Other obesity due to excess calories: Secondary | ICD-10-CM | POA: Diagnosis not present

## 2016-07-17 DIAGNOSIS — F329 Major depressive disorder, single episode, unspecified: Secondary | ICD-10-CM | POA: Diagnosis not present

## 2016-07-17 DIAGNOSIS — Z79891 Long term (current) use of opiate analgesic: Secondary | ICD-10-CM | POA: Diagnosis not present

## 2016-07-17 DIAGNOSIS — E669 Obesity, unspecified: Secondary | ICD-10-CM | POA: Diagnosis not present

## 2016-07-17 DIAGNOSIS — Z23 Encounter for immunization: Secondary | ICD-10-CM | POA: Diagnosis not present

## 2016-07-17 DIAGNOSIS — G894 Chronic pain syndrome: Secondary | ICD-10-CM | POA: Diagnosis not present

## 2016-07-18 ENCOUNTER — Ambulatory Visit (INDEPENDENT_AMBULATORY_CARE_PROVIDER_SITE_OTHER): Payer: PPO

## 2016-07-18 ENCOUNTER — Ambulatory Visit (INDEPENDENT_AMBULATORY_CARE_PROVIDER_SITE_OTHER): Payer: PPO | Admitting: Orthopedic Surgery

## 2016-07-18 ENCOUNTER — Encounter: Payer: Self-pay | Admitting: Orthopedic Surgery

## 2016-07-18 VITALS — BP 130/58 | HR 92 | Wt 210.0 lb

## 2016-07-18 DIAGNOSIS — M5136 Other intervertebral disc degeneration, lumbar region: Secondary | ICD-10-CM

## 2016-07-18 DIAGNOSIS — Z96651 Presence of right artificial knee joint: Secondary | ICD-10-CM | POA: Diagnosis not present

## 2016-07-18 DIAGNOSIS — M1711 Unilateral primary osteoarthritis, right knee: Secondary | ICD-10-CM

## 2016-07-18 DIAGNOSIS — G894 Chronic pain syndrome: Secondary | ICD-10-CM | POA: Diagnosis not present

## 2016-07-18 MED ORDER — OXYCODONE-ACETAMINOPHEN 7.5-325 MG PO TABS: 1.0000 | ORAL_TABLET | Freq: Four times a day (QID) | ORAL | 0 refills | Status: DC | PRN

## 2016-07-18 NOTE — Addendum Note (Signed)
Addended by: Adella HareBOOTHE, JAIME B on: 07/18/2016 12:01 PM   Modules accepted: Orders

## 2016-07-18 NOTE — Progress Notes (Signed)
ANNUAL FOLLOW UP FOR  Right  TKA  Chief Complaint  Patient presents with  . Follow-up    FOLLOW UP W/ XRAY Right TKA, DOS 07/01/12   Hpi: The patient is here for the 4 year follow-up x-ray, Surgery was contacted my postoperative wound breakdown and chronic pain.  System review back pain intermittently , no fever chills or erythema right knee Back pain  Right leg radiating pain   Examination of the right  KNEE   Gen. appearance is normal she is ambulatory without assistive devices today she is oriented 3 mood and affect are pleasant gait shows no noticeable limp   Inspection shows : incision healed nicely without erythema, no tenderness no swelling  Range of motion total range of motion is 90  Stability the knee is stable anterior to posterior as well as medial to lateral  Strength quadriceps strength is normal  Skin no erythema around the skin incision  Cardiovascular mild distal peripheral edema  Left knee starting to bother her. Flexion remains normal as does extension. No swelling.  Medical decision-making section  X-rays ordered with the following personal interpretation  Normal rt tka appearance   Diagnosis  Plan follow-up 1 year repeat x-rays   Refill oxycodone  Encounter Diagnoses  Name Primary?  . History of total right knee replacement Yes  . Degenerative disc disease, lumbar   . Chronic pain syndrome

## 2016-07-20 ENCOUNTER — Ambulatory Visit (INDEPENDENT_AMBULATORY_CARE_PROVIDER_SITE_OTHER): Payer: PPO | Admitting: Psychiatry

## 2016-07-20 ENCOUNTER — Encounter (HOSPITAL_COMMUNITY): Payer: Self-pay | Admitting: Psychiatry

## 2016-07-20 VITALS — BP 140/73 | HR 75 | Ht 63.0 in | Wt 209.6 lb

## 2016-07-20 DIAGNOSIS — F3130 Bipolar disorder, current episode depressed, mild or moderate severity, unspecified: Secondary | ICD-10-CM

## 2016-07-20 DIAGNOSIS — F5105 Insomnia due to other mental disorder: Secondary | ICD-10-CM | POA: Diagnosis not present

## 2016-07-20 DIAGNOSIS — Z79899 Other long term (current) drug therapy: Secondary | ICD-10-CM

## 2016-07-20 MED ORDER — DIVALPROEX SODIUM 250 MG PO DR TAB: DELAYED_RELEASE_TABLET | ORAL | 2 refills | Status: DC

## 2016-07-20 MED ORDER — ZOLPIDEM TARTRATE 10 MG PO TABS: 10.0000 mg | ORAL_TABLET | Freq: Every evening | ORAL | 2 refills | Status: DC | PRN

## 2016-07-20 MED ORDER — ESCITALOPRAM OXALATE 20 MG PO TABS: 20.0000 mg | ORAL_TABLET | Freq: Every day | ORAL | 2 refills | Status: DC

## 2016-07-20 MED ORDER — ALPRAZOLAM 0.5 MG PO TABS: ORAL_TABLET | ORAL | 2 refills | Status: DC

## 2016-07-20 NOTE — Progress Notes (Signed)
Patient ID: Diane [name], female   DOB: 17-Jun-1951, 65 y.o.   MRN: [id number] Patient ID: Diane [name], female   DOB: 1951/03/24, 65 y.o.   MRN: [id number] Patient ID: Diane [name], female   DOB: 1951-08-27, 65 y.o.   MRN: [id number] Patient ID: Diane [name], female   DOB: 07/02/1951, 65 y.o.   MRN: [id number] Patient ID: LEEAN [name], female   DOB: 07/29/1951, 65 y.o.   MRN: [id number] Patient ID: Diane [name], female   DOB: May 12, 1951, 65 y.o.   MRN: [id number] Patient ID: Diane [name], female   DOB: 1951/06/22, 65 y.o.   MRN: [id number] Patient ID: Diane [name], female   DOB: Mar 05, 1951, 65 y.o.   MRN: [id number] Patient ID: Diane [name], female   DOB: 07-29-1951, 65 y.o.   MRN: [id number] Patient ID: Diane [name], female   DOB: 05-02-1951, 65 y.o.   MRN: [id number] Patient ID: Diane [name], female   DOB: 1951/01/07, 65 y.o.   MRN: [id number] [facility] [id number] Progress Note Diane [name] MRN: [id number] DOB: 05-17-1951 Age: 65 y.o.  Date: 07/20/2016  Chief Complaint  Patient presents with  . Depression  . Anxiety  . Follow-up    History of present illness Patient is 65 year old [place] American female who came for her followup appointment. She is currently with her Ran son in [place] and is on disability  The patient was last seen in March. At that time she told me she was moving to [place] to be with her daughter. She didn't like living down there and she and her grandson have come back. Her grandson got his own place to live and she is now staying in a motel temporarily. Her mood is "up and down" because she is unsure where she is going to live. She's in a lot of pain in her knee and would like to get back on the amitriptyline since she ran out. A nurse practitioner and primary care put her on Celexa without realizing that she's already on Cymbalta. I explained to her that made no sense to be on both. She does admit that she still drinks periodically sometimes too  much on weekends. I warned her against this. She denies being severely depressed or suicidal  The patient returns after 3 months. For the most part she is doing okay. She states that she is cut way back on her drinking and occasionally only drinks 1 beer. The Lexapro and Depakote help her mood but she still stresses about money. He is not able to sleep without the Ambien and the Xanax helps her anxiety. I have again warned her about combining alcohol with any of these medications and she voices understanding.   Past psychiatric history Patient has history of one psychiatric admission in 2000 needed behavioral [facility]. She took overdose on her pills she was also intoxicated with alcohol. Patient has also seen by psychiatrist in 2004. At that time she was seeking more benzodiazepines and pain medication.  Vitals: BP 140/73 (BP Location: Right Arm, Patient Position: Sitting, Cuff Size: Large)   Pulse 75   Ht _0  (1.6 m)   Wt 209 lb 9.6 oz (95.1 kg)   BMI 37.13 kg/m   Allergies:  Medical History: Past Medical History:  Diagnosis Date  . Allergic rhinitis   . Anxiety   . Arthritis   . Cataracts, bilateral   . Chronic back pain    Sees pain mamagement clinic  .  Depression   . GERD (gastroesophageal reflux disease)   . Headaches, cluster 3 yrs ago   last one  . High cholesterol   . Hypertension   . Hypothyroid   . Immature cataract   . Mania ([place])   . PTSD (post-traumatic stress disorder)   . Seizures ([place]) 20 yrs ago   unknown etiology-on meds  . Sleep apnea    STOP BANG score=4  . 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Patient has history of hypertension, allergic rhinitis, hypothyroidism, chronic back pain, hyperlipidemia and obesity.  She most recently had surgery on her right knee.  Surgical History: Past Surgical History:  Procedure Laterality Date  . BACK SURGERY    . CATARACT EXTRACTION W/PHACO Left 01/04/2016   Procedure: CATARACT EXTRACTION PHACO AND INTRAOCULAR  LENS PLACEMENT (IOC);  Surgeon: Rutherford Guys, MD;  Location: [facility];  Service: Ophthalmology;  Laterality: Left;  CDE:4.70  . CATARACT EXTRACTION W/PHACO Right 01/18/2016   Procedure: CATARACT EXTRACTION PHACO AND INTRAOCULAR LENS PLACEMENT RIGHT EYE; CDE:  4.66;  Surgeon: Rutherford Guys, MD;  Location: [facility];  Service: Ophthalmology;  Laterality: Right;  . CHOLECYSTECTOMY    . COLONOSCOPY  10/16/2008   BSJ:GGEZMOQHUTM, otherwise normal rectum; pancolonic diverticula the remainder of the colonic mucosa appeared normal. Next TCS due 10/2013.  Marland Kitchen COLONOSCOPY  06/28/2006   RMR: Internal hemorrhoids. Diminutive rectal polyps, cold biopsied/ Pancolonic diverticula. Polyps in the right colon removed   . COLONOSCOPY WITH PROPOFOL N/A 05/20/2015   RMR: Pancolonic diverticulosis. Redundant colon   . ESOPHAGOGASTRODUODENOSCOPY  10/16/2008   LYY:TKPTW hiatal hernia otherwise normal esophagus, stomach  D1, D2, status post passage of a 56-French Maloney dilator  . ESOPHAGOGASTRODUODENOSCOPY  06/28/2006   SFK:CLEXNT esophagus. Small hiatal hernia. Otherwise normal stomach, D1 and D2, status post passage of a 56 [place] Maloney dilator  . ESOPHAGOGASTRODUODENOSCOPY (EGD) WITH ESOPHAGEAL DILATION N/A 07/14/2013   RMR: Abnormal esophagus of uncertain significance-status post esophageal biospy. small hiatal hernia.   Marland Kitchen ESOPHAGOGASTRODUODENOSCOPY (EGD) WITH PROPOFOL N/A 05/20/2015   RMR: NORMAL EGD status post maloney dilation   . FOOT SURGERY     left foot-  . INCISION AND DRAINAGE Right 01/03/2013   Procedure: INCISION AND DRAINAGE;  Surgeon: Carole Civil, MD;  Location: [facility];  Service: Orthopedics;  Laterality: Right;  . KNEE ARTHROSCOPY  01/2012   right  . KNEE ARTHROSCOPY WITH LATERAL RELEASE Right 01/03/2013   Procedure: Lateral Release Patella Right Knee;  Surgeon: Carole Civil, MD;  Location: [facility];  Service: Orthopedics;  Laterality: Right;  . left foot  x 2  . LUMBAR DISC SURGERY  L4-5  . MALONEY  DILATION N/A 05/20/2015   Procedure: Venia Minks DILATION;  Surgeon: Daneil Dolin, MD;  Location: [facility];  Service: Endoscopy;  Laterality: N/A;  Maloney dilator # 25  . oophoectomy  bilateral  . PARTIAL HYSTERECTOMY    . TOTAL KNEE ARTHROPLASTY  07/01/2012   Procedure: TOTAL KNEE ARTHROPLASTY;  Surgeon: Carole Civil, MD;  Location: [facility];  Service: Orthopedics;  Laterality: Right;  . TOTAL KNEE REVISION Right 01/03/2013   Procedure: Patellaplasty Right Knee;  Surgeon: Carole Civil, MD;  Location: [facility];  Service: Orthopedics;  Laterality: Right;   Family History: family history includes ADD / ADHD in her grandchild and grandchild; Alcohol abuse in her brother, brother, brother, brother, brother, father, and mother; Anxiety disorder in her brother, brother, brother, brother, brother, father, and mother; Bipolar disorder in her sister; Dementia in her  paternal aunt; Drug abuse in her brother and brother; Seizures in her brother and brother.   Psychosocial history Patient has been married for 16 years. She has 2 children. She is married however her relationship is very tense. She has history of leaving her husband in the past.  Currently she lives by herself.  Mental status examination Patient is mildly obese female who is casually dressed and fairly groomed.  .  She is cooperative and maintained fair eye contact her mood is Fairly good and she states that she is feeling better in terms of depression. Her affect is Definitely brighter today She denies suicidal ideation . Her speech is clear and coherent. Her attention and concentration is fair. . She's alert and oriented x3. There are no psychotic symptoms present at this time. She denies any auditory or visual hallucination. Her insight judgment is fair and her impulse control is okay.  Lab Results:  Results for orders placed or performed during the hospital encounter of 12/31/15 (from the past 8736 hour(s))  Surgical pcr screen    Collection Time: 12/31/15 10:57 AM  Result Value Ref Range   MRSA, PCR NEGATIVE NEGATIVE   Staphylococcus aureus NEGATIVE NEGATIVE  CBC   Collection Time: 12/31/15 10:57 AM  Result Value Ref Range   WBC 4.7 4.0 - 10.5 K/uL   RBC 4.41 3.87 - 5.11 MIL/uL   Hemoglobin 12.6 12.0 - 15.0 g/dL   HCT 38.6 36.0 - 46.0 %   MCV 87.5 78.0 - 100.0 fL   MCH 28.6 26.0 - 34.0 pg   MCHC 32.6 30.0 - 36.0 g/dL   RDW 14.1 11.5 - 15.5 %   Platelets 153 150 - 400 K/uL  Basic metabolic panel   Collection Time: 12/31/15 10:57 AM  Result Value Ref Range   Sodium 142 135 - 145 mmol/L   Potassium 3.9 3.5 - 5.1 mmol/L   Chloride 107 101 - 111 mmol/L   CO2 26 22 - 32 mmol/L   Glucose, Bld 105 (H) 65 - 99 mg/dL   BUN 14 6 - 20 mg/dL   Creatinine, Ser 0.91 0.44 - 1.00 mg/dL   Calcium 8.8 (L) 8.9 - 10.3 mg/dL   GFR calc non Af Amer >60 >60 mL/min   GFR calc Af Amer >60 >60 mL/min   Anion gap 9 5 - 15  Results for orders placed or performed in visit on 08/09/15 (from the past 8736 hour(s))  CBC with Differential/Platelet   Collection Time: 08/23/15  3:17 PM  Result Value Ref Range   WBC 4.9 4.0 - 10.5 K/uL   RBC 4.58 3.87 - 5.11 MIL/uL   Hemoglobin 12.4 12.0 - 15.0 g/dL   HCT 37.4 36.0 - 46.0 %   MCV 81.7 78.0 - 100.0 fL   MCH 27.1 26.0 - 34.0 pg   MCHC 33.2 30.0 - 36.0 g/dL   RDW 14.7 11.5 - 15.5 %   Platelets 192 150 - 400 K/uL   MPV 11.4 8.6 - 12.4 fL   Neutrophils Relative % 37 (L) 43 - 77 %   Neutro Abs 1.8 1.7 - 7.7 K/uL   Lymphocytes Relative 54 (H) 12 - 46 %   Lymphs Abs 2.6 0.7 - 4.0 K/uL   Monocytes Relative 8 3 - 12 %   Monocytes Absolute 0.4 0.1 - 1.0 K/uL   Eosinophils Relative 1 0 - 5 %   Eosinophils Absolute 0.0 0.0 - 0.7 K/uL   Basophils Relative 0 0 - 1 %  Basophils Absolute 0.0 0.0 - 0.1 K/uL   Smear Review Criteria for review not met   Comprehensive metabolic panel   Collection Time: 08/23/15  3:17 PM  Result Value Ref Range   Sodium 139 135 - 146 mmol/L   Potassium 4.4  3.5 - 5.3 mmol/L   Chloride 103 98 - 110 mmol/L   CO2 29 20 - 31 mmol/L   Glucose, Bld 82 65 - 99 mg/dL   BUN 11 7 - 25 mg/dL   Creat 0.76 0.50 - 0.99 mg/dL   Total Bilirubin 0.6 0.2 - 1.2 mg/dL   Alkaline Phosphatase 65 33 - 130 U/L   AST 13 10 - 35 U/L   ALT 8 6 - 29 U/L   Total Protein 6.5 6.1 - 8.1 g/dL   Albumin 3.4 (L) 3.6 - 5.1 g/dL   Calcium 8.8 8.6 - 10.4 mg/dL  Lipase   Collection Time: 08/23/15  3:17 PM  Result Value Ref Range   Lipase 11 7 - 60 U/L   Assessment Axis I bipolar disorder NOS, benzodiazepine abuse, alcohol abuse Axis II deferred Axis III see medical history Axis IV moderate Axis V 60-65  Plan/Discussion: She'll Continue Ambien 10 mg at bedtime for sleep,, continue Xanax for anxiety  0.5 mg in the morning and 1 mg at bedtime and adding other 0.5 mg in the afternoon and Depakote for mood disorder. She will continue Lexapro 20 mg daily for depression She'll return to see me in 3 months. We have tried to schedule her for counseling here but she is not consistent in follow-up. She has been warned to stop drinking Time spent 15 minutes.  More than 50% of the time spent in psychoeducation, counseling and coordination of care.  Discuss safety plan that anytime having active suicidal thoughts or homicidal thoughts then patient need to call 911 or go to the local emergency room.  MEDICATIONS this encounter: Meds ordered this encounter  Medications  . escitalopram (LEXAPRO) 20 MG tablet    Sig: Take 1 tablet (20 mg total) by mouth daily.    Dispense:  30 tablet    Refill:  2    This prescription was filled on 07/14/2016. Any refills authorized will be placed on file.  . divalproex (DEPAKOTE) 250 MG DR tablet    Sig: TAKE THREE (3) TABLETS BY MOUTH AT BEDTIME    Dispense:  90 tablet    Refill:  2    This prescription was filled on 07/14/2016. Any refills authorized will be placed on file.  Marland Kitchen ALPRAZolam (XANAX) 0.5 MG tablet    Sig: Take one twice a day and 2 at  bedtime    Dispense:  120 tablet    Refill:  2  . zolpidem (AMBIEN) 10 MG tablet    Sig: Take 1 tablet (10 mg total) by mouth at bedtime as needed for sleep.    Dispense:  30 tablet    Refill:  2    Medical Decision Making Problem Points:  Established problem, stable/improving (1), Established problem, worsening (2), New problem, with no additional work-up planned (3), Review of last therapy session (1) and Review of psycho-social stressors (1) Data Points:  Review or order clinical lab tests (1) Review and summation of old records (2) Review of medication regiment & side effects (2) Review of new medications or change in dosage (2)  Glenice Ciccone, MDPatient ID: Diane [name], female   DOB: May 17, 1951, 65 y.o.   MRN: [id number]

## 2016-07-31 ENCOUNTER — Other Ambulatory Visit: Payer: Self-pay | Admitting: Orthopedic Surgery

## 2016-07-31 ENCOUNTER — Other Ambulatory Visit: Payer: Self-pay | Admitting: *Deleted

## 2016-07-31 ENCOUNTER — Telehealth: Payer: Self-pay | Admitting: Orthopedic Surgery

## 2016-07-31 DIAGNOSIS — M1711 Unilateral primary osteoarthritis, right knee: Secondary | ICD-10-CM

## 2016-07-31 MED ORDER — OXYCODONE-ACETAMINOPHEN 7.5-325 MG PO TABS: 1.0000 | ORAL_TABLET | Freq: Four times a day (QID) | ORAL | 0 refills | Status: DC | PRN

## 2016-07-31 NOTE — Telephone Encounter (Signed)
Patient requests refill:  oxyCODONE-acetaminophen (PERCOCET) 7.5-325 MG tablet 56 tablet  [facility]

## 2016-08-12 ENCOUNTER — Other Ambulatory Visit: Payer: Self-pay | Admitting: Orthopedic Surgery

## 2016-08-12 DIAGNOSIS — M1711 Unilateral primary osteoarthritis, right knee: Secondary | ICD-10-CM

## 2016-08-16 ENCOUNTER — Other Ambulatory Visit (HOSPITAL_COMMUNITY): Payer: Self-pay | Admitting: Psychiatry

## 2016-08-16 ENCOUNTER — Other Ambulatory Visit: Payer: Self-pay | Admitting: Nurse Practitioner

## 2016-08-16 DIAGNOSIS — F5105 Insomnia due to other mental disorder: Secondary | ICD-10-CM

## 2016-08-16 DIAGNOSIS — K219 Gastro-esophageal reflux disease without esophagitis: Secondary | ICD-10-CM

## 2016-08-17 MED ORDER — OXYCODONE-ACETAMINOPHEN 7.5-325 MG PO TABS: 1.0000 | ORAL_TABLET | Freq: Four times a day (QID) | ORAL | 0 refills | Status: DC | PRN

## 2016-08-17 NOTE — Telephone Encounter (Signed)
Fol/up on MyChart medication refill request of 08/15/16 oxyCODONE-acetaminophen (PERCOCET) 7.5-325 MG tablet 56 tablet

## 2016-08-29 ENCOUNTER — Other Ambulatory Visit: Payer: Self-pay | Admitting: Orthopedic Surgery

## 2016-08-29 DIAGNOSIS — M1711 Unilateral primary osteoarthritis, right knee: Secondary | ICD-10-CM

## 2016-08-29 MED ORDER — OXYCODONE-ACETAMINOPHEN 7.5-325 MG PO TABS: 1.0000 | ORAL_TABLET | Freq: Four times a day (QID) | ORAL | 0 refills | Status: DC | PRN

## 2016-09-12 ENCOUNTER — Telehealth: Payer: Self-pay | Admitting: Orthopedic Surgery

## 2016-09-12 NOTE — Telephone Encounter (Signed)
Patient called for refill:  oxyCODONE-acetaminophen (PERCOCET) 7.5-325 MG tablet 56 tablet

## 2016-09-13 ENCOUNTER — Other Ambulatory Visit: Payer: Self-pay | Admitting: Orthopedic Surgery

## 2016-09-13 DIAGNOSIS — M1711 Unilateral primary osteoarthritis, right knee: Secondary | ICD-10-CM

## 2016-09-13 MED ORDER — OXYCODONE-ACETAMINOPHEN 7.5-325 MG PO TABS: 1.0000 | ORAL_TABLET | Freq: Four times a day (QID) | ORAL | 0 refills | Status: DC | PRN

## 2016-09-13 NOTE — Telephone Encounter (Signed)
Routing to Dr Harrison for approval 

## 2016-09-26 ENCOUNTER — Other Ambulatory Visit: Payer: Self-pay | Admitting: Orthopedic Surgery

## 2016-09-26 DIAGNOSIS — M1711 Unilateral primary osteoarthritis, right knee: Secondary | ICD-10-CM

## 2016-10-02 ENCOUNTER — Other Ambulatory Visit: Payer: Self-pay | Admitting: Orthopedic Surgery

## 2016-10-02 MED ORDER — OXYCODONE-ACETAMINOPHEN 7.5-325 MG PO TABS: 1.0000 | ORAL_TABLET | Freq: Four times a day (QID) | ORAL | 0 refills | Status: DC | PRN

## 2016-10-02 NOTE — Telephone Encounter (Signed)
OKAY TO FILL?

## 2016-10-02 NOTE — Progress Notes (Signed)
[place] controlled substance reporting system reviewed  

## 2016-10-09 ENCOUNTER — Other Ambulatory Visit (HOSPITAL_COMMUNITY): Payer: Self-pay | Admitting: Family Medicine

## 2016-10-09 DIAGNOSIS — Z1231 Encounter for screening mammogram for malignant neoplasm of breast: Secondary | ICD-10-CM

## 2016-10-12 ENCOUNTER — Ambulatory Visit (HOSPITAL_COMMUNITY): Payer: Self-pay

## 2016-10-13 ENCOUNTER — Other Ambulatory Visit: Payer: Self-pay | Admitting: Orthopedic Surgery

## 2016-10-13 DIAGNOSIS — M1711 Unilateral primary osteoarthritis, right knee: Secondary | ICD-10-CM

## 2016-10-13 MED ORDER — OXYCODONE-ACETAMINOPHEN 7.5-325 MG PO TABS: 1.0000 | ORAL_TABLET | Freq: Four times a day (QID) | ORAL | 0 refills | Status: DC | PRN

## 2016-10-13 NOTE — Progress Notes (Unsigned)
[place] controlled substance reporting system reviewed  

## 2016-10-19 ENCOUNTER — Ambulatory Visit (HOSPITAL_COMMUNITY): Payer: Self-pay

## 2016-10-20 ENCOUNTER — Ambulatory Visit (HOSPITAL_COMMUNITY): Payer: Self-pay | Admitting: Psychiatry

## 2016-10-23 ENCOUNTER — Telehealth (HOSPITAL_COMMUNITY): Payer: Self-pay | Admitting: *Deleted

## 2016-10-23 ENCOUNTER — Ambulatory Visit (HOSPITAL_COMMUNITY): Payer: Self-pay | Admitting: Psychiatry

## 2016-10-23 NOTE — Telephone Encounter (Signed)
Provider out of office 10-23-2016 and need to resch appt. lmtcb.

## 2016-10-27 ENCOUNTER — Other Ambulatory Visit: Payer: Self-pay | Admitting: Gastroenterology

## 2016-10-27 ENCOUNTER — Other Ambulatory Visit (HOSPITAL_COMMUNITY): Payer: Self-pay | Admitting: Psychiatry

## 2016-10-27 ENCOUNTER — Other Ambulatory Visit: Payer: Self-pay | Admitting: Orthopedic Surgery

## 2016-10-27 DIAGNOSIS — M1711 Unilateral primary osteoarthritis, right knee: Secondary | ICD-10-CM

## 2016-10-27 DIAGNOSIS — F3130 Bipolar disorder, current episode depressed, mild or moderate severity, unspecified: Secondary | ICD-10-CM

## 2016-10-27 MED ORDER — OXYCODONE-ACETAMINOPHEN 7.5-325 MG PO TABS: 1.0000 | ORAL_TABLET | Freq: Four times a day (QID) | ORAL | 0 refills | Status: DC | PRN

## 2016-10-27 NOTE — Telephone Encounter (Signed)
Meds ordered this encounter  Medications  . oxyCODONE-acetaminophen (PERCOCET) 7.5-325 MG tablet    Sig: Take 1 tablet by mouth every 6 (six) hours as needed.    Dispense:  56 tablet    Refill:  0   [place] controlled substance [place] system reviewed

## 2016-11-01 ENCOUNTER — Telehealth (HOSPITAL_COMMUNITY): Payer: Self-pay | Admitting: *Deleted

## 2016-11-01 ENCOUNTER — Other Ambulatory Visit (HOSPITAL_COMMUNITY): Payer: Self-pay | Admitting: Psychiatry

## 2016-11-01 DIAGNOSIS — F3130 Bipolar disorder, current episode depressed, mild or moderate severity, unspecified: Secondary | ICD-10-CM

## 2016-11-01 MED ORDER — DIVALPROEX SODIUM 250 MG PO DR TAB: DELAYED_RELEASE_TABLET | ORAL | 0 refills | Status: DC

## 2016-11-01 NOTE — Telephone Encounter (Signed)
Message sent to Dr. Hisada and Dr. Ross.  

## 2016-11-01 NOTE — Telephone Encounter (Signed)
noted 

## 2016-11-01 NOTE — Telephone Encounter (Signed)
Ordered depakote for one month

## 2016-11-01 NOTE — Telephone Encounter (Signed)
Pt pharmacy requesting refills for pt Divalproex 250 mg 3 tabs QHS. Pt medication was last filled on 07-20-2016 with 90 tabs 2 refills. Pt was to f/u with Dr. Ross on 10-20-2016 but no showed and was rescheduled for 10-23-2016 but provider was out of office. Pt was last seen on 07-20-2016 and f/u appt is 11-06-2016. Pt pharmacy is [name] [phone]. 

## 2016-11-01 NOTE — Telephone Encounter (Signed)
Pt pharmacy requesting refills for pt Divalproex 250 mg 3 tabs QHS. Pt medication was last filled on 07-20-2016 with 90 tabs 2 refills. Pt was to f/u with Dr. Tenny Crawoss on 10-20-2016 but no showed and was rescheduled for 10-23-2016 but provider was out of office. Pt was last seen on 07-20-2016 and f/u appt is 11-06-2016. Pt pharmacy is [name] [phone].

## 2016-11-06 ENCOUNTER — Ambulatory Visit (INDEPENDENT_AMBULATORY_CARE_PROVIDER_SITE_OTHER): Payer: PPO | Admitting: Psychiatry

## 2016-11-06 ENCOUNTER — Encounter (HOSPITAL_COMMUNITY): Payer: Self-pay | Admitting: Psychiatry

## 2016-11-06 VITALS — BP 150/74 | HR 54 | Ht 63.0 in | Wt 208.0 lb

## 2016-11-06 DIAGNOSIS — F1310 Sedative, hypnotic or anxiolytic abuse, uncomplicated: Secondary | ICD-10-CM | POA: Diagnosis not present

## 2016-11-06 DIAGNOSIS — F5105 Insomnia due to other mental disorder: Secondary | ICD-10-CM

## 2016-11-06 DIAGNOSIS — Z811 Family history of alcohol abuse and dependence: Secondary | ICD-10-CM

## 2016-11-06 DIAGNOSIS — F1010 Alcohol abuse, uncomplicated: Secondary | ICD-10-CM | POA: Diagnosis not present

## 2016-11-06 DIAGNOSIS — F3130 Bipolar disorder, current episode depressed, mild or moderate severity, unspecified: Secondary | ICD-10-CM | POA: Diagnosis not present

## 2016-11-06 DIAGNOSIS — Z818 Family history of other mental and behavioral disorders: Secondary | ICD-10-CM | POA: Diagnosis not present

## 2016-11-06 MED ORDER — ESCITALOPRAM OXALATE 20 MG PO TABS: 20.0000 mg | ORAL_TABLET | Freq: Every day | ORAL | 2 refills | Status: DC

## 2016-11-06 MED ORDER — ZOLPIDEM TARTRATE 10 MG PO TABS: 10.0000 mg | ORAL_TABLET | Freq: Every evening | ORAL | 2 refills | Status: DC | PRN

## 2016-11-06 MED ORDER — DIVALPROEX SODIUM 250 MG PO DR TAB: DELAYED_RELEASE_TABLET | ORAL | 0 refills | Status: DC

## 2016-11-06 MED ORDER — ALPRAZOLAM 0.5 MG PO TABS: ORAL_TABLET | ORAL | 2 refills | Status: DC

## 2016-11-06 NOTE — Progress Notes (Signed)
Patient ID: Diane [name], female   DOB: Nov 26, 1950, 66 y.o.   MRN: [id number] Patient ID: Diane [name], female   DOB: 12-11-1950, 66 y.o.   MRN: [id number] Patient ID: Diane [name], female   DOB: 1950/11/12, 66 y.o.   MRN: [id number] Patient ID: Diane [name], female   DOB: April 29, 1951, 66 y.o.   MRN: [id number] Patient ID: Diane [name], female   DOB: [birth year], 66 y.o.   MRN: [id number] Patient ID: Diane [name], female   DOB: 1951/06/20, 66 y.o.   MRN: [id number] Patient ID: Diane [name], female   DOB: 14-Jun-1951, 66 y.o.   MRN: [id number] Patient ID: Diane [name], female   DOB: 1951/07/29, 66 y.o.   MRN: [id number] Patient ID: Diane [name], female   DOB: [birth year], 66 y.o.   MRN: [id number] Patient ID: Diane [name], female   DOB: Jan 19, 1951, 66 y.o.   MRN: [id number] Patient ID: Diane [name], female   DOB: 27-Oct-1950, 66 y.o.   MRN: [id number] [facility] [address] Progress Note Diane [name] MRN: [id number] DOB: [birth year] Age: 66 y.o.  Date: 11/06/2016  Chief Complaint  Patient presents with  . Depression  . Anxiety    History of present illness Patient is 66 year old [place] American female who came for her followup appointment. She is currently with her grandson in [place] and is on disability  The patient was last seen in March. At that time she told me she was moving to [place] to be with her daughter. She didn't like living down there and she and her grandson have come back. Her grandson got his own place to live and she is now staying in a motel temporarily. Her mood is "up and down" because she is unsure where she is going to live. She's in a lot of pain in her knee and would like to get back on the amitriptyline since she ran out. A nurse practitioner and primary care put her on Celexa without realizing that she's already on Cymbalta. I explained to her that made no sense to be on both. She does admit that she still drinks periodically sometimes too much on  weekends. I warned her against this. She denies being severely depressed or suicidal  The patient returns after 3 months. For the most part she is doing okay. She states that her boyfriend moved in and this is helping financially. However her car is not working and they are down to one car and she drives both her boyfriend and her grandson to work every day and picks them all up. She's frustrated with not having her own vehicle. She states that at times she feels down about the whole situation but her mood is generally stable and she denies suicidal ideation or manic episodes. She is sleeping well with the Ambien   Past psychiatric history Patient has history of one psychiatric admission in [facility]. She took overdose on her pills she was also intoxicated with alcohol. Patient has also seen by psychiatrist in 2004. At that time she was seeking more benzodiazepines and pain medication.  Vitals: BP (!) 150/74 Comment: per pt did not take BP meds yet  Pulse (!) 54   Ht 5\' 3"  (1.6 m)   Wt 208 lb (94.3 kg)   SpO2 95%   BMI 36.85 kg/m   Allergies:  Medical History: Past Medical History:  Diagnosis Date  . Allergic rhinitis   . Anxiety   .  Arthritis   . Cataracts, bilateral   . Chronic back pain    Sees pain mamagement clinic  . Depression   . GERD (gastroesophageal reflux disease)   . Headaches, cluster 3 yrs ago   last one  . High cholesterol   . Hypertension   . Hypothyroid   . Immature cataract   . Mania (HCC)   . PTSD (post-traumatic stress disorder)   . Seizures (HCC) 20 yrs ago   unknown etiology-on meds  . Sleep apnea    STOP BANG score=4  . 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Patient has history of hypertension, allergic rhinitis, hypothyroidism, chronic back pain, hyperlipidemia and obesity.  She most recently had surgery on her right knee.  Surgical History: Past Surgical History:  Procedure Laterality Date  . BACK SURGERY    . CATARACT  EXTRACTION W/PHACO Left 01/04/2016   Procedure: CATARACT EXTRACTION PHACO AND INTRAOCULAR LENS PLACEMENT (IOC);  Surgeon: Jethro Bolus, MD;  Location: [facility];  Service: Ophthalmology;  Laterality: Left;  CDE:4.70  . CATARACT EXTRACTION W/PHACO Right 01/18/2016   Procedure: CATARACT EXTRACTION PHACO AND INTRAOCULAR LENS PLACEMENT RIGHT EYE; CDE:  4.66;  Surgeon: Jethro Bolus, MD;  Location: [facility];  Service: Ophthalmology;  Laterality: Right;  . CHOLECYSTECTOMY    . COLONOSCOPY  10/16/2008   ZOX:[id number], otherwise normal rectum; pancolonic diverticula the remainder of the colonic mucosa appeared normal. Next TCS due 10/2013.  Marland Kitchen COLONOSCOPY  06/28/2006   RMR: Internal hemorrhoids. Diminutive rectal polyps, cold biopsied/ Pancolonic diverticula. Polyps in the right colon removed   . COLONOSCOPY WITH PROPOFOL N/A 05/20/2015   RMR: Pancolonic diverticulosis. Redundant colon   . ESOPHAGOGASTRODUODENOSCOPY  10/16/2008   XBJ:YNWGN hiatal hernia otherwise normal esophagus, stomach  D1, D2, status post passage of a 56-French Maloney dilator  . ESOPHAGOGASTRODUODENOSCOPY  06/28/2006   FAO:ZHYQMV esophagus. Small hiatal hernia. Otherwise normal stomach, D1 and D2, status post passage of a 56 [place] Maloney dilator  . ESOPHAGOGASTRODUODENOSCOPY (EGD) WITH ESOPHAGEAL DILATION N/A 07/14/2013   RMR: Abnormal esophagus of uncertain significance-status post esophageal biospy. small hiatal hernia.   Marland Kitchen ESOPHAGOGASTRODUODENOSCOPY (EGD) WITH PROPOFOL N/A 05/20/2015   RMR: NORMAL EGD status post maloney dilation   . FOOT SURGERY     left foot-  . INCISION AND DRAINAGE Right 01/03/2013   Procedure: INCISION AND DRAINAGE;  Surgeon: Vickki Hearing, MD;  Location: [facility];  Service: Orthopedics;  Laterality: Right;  . KNEE ARTHROSCOPY  01/2012   right  . KNEE ARTHROSCOPY WITH LATERAL RELEASE Right 01/03/2013   Procedure: Lateral Release Patella Right Knee;  Surgeon: Vickki Hearing, MD;  Location: [facility];  Service:  Orthopedics;  Laterality: Right;  . left foot  x 2  . LUMBAR DISC SURGERY  L4-5  . MALONEY DILATION N/A 05/20/2015   Procedure: Elease Hashimoto DILATION;  Surgeon: Corbin Ade, MD;  Location: [facility];  Service: Endoscopy;  Laterality: N/A;  Maloney dilator # 54  . oophoectomy  bilateral  . PARTIAL HYSTERECTOMY    . TOTAL KNEE ARTHROPLASTY  07/01/2012   Procedure: TOTAL KNEE ARTHROPLASTY;  Surgeon: Vickki Hearing, MD;  Location: [facility];  Service: Orthopedics;  Laterality: Right;  . TOTAL KNEE REVISION Right 01/03/2013   Procedure: Patellaplasty Right Knee;  Surgeon: Vickki Hearing, MD;  Location: [facility];  Service: Orthopedics;  Laterality: Right;   Family History: family history includes ADD / ADHD in her grandchild and grandchild; Alcohol abuse in her brother, brother, brother, brother, brother, father, and  mother; Anxiety disorder in her brother, brother, brother, brother, brother, father, and mother; Bipolar disorder in her sister; Dementia in her paternal aunt; Drug abuse in her brother and brother; Seizures in her brother and brother.   Psychosocial history Patient has been married for 16 years. She has 2 children. She is married however her relationship is very tense. She has history of leaving her husband in the past.  Currently she lives by herself.  Mental status examination Patient is mildly obese female who is casually dressed and fairly groomed.  .  She is cooperative and maintained fair eye contact her mood is Fairly good and she states that she is feeling better in terms of depression. Her affect is A bit constricted today She denies suicidal ideation . Her speech is clear and coherent. Her attention and concentration is fair. . She's alert and oriented x3. There are no psychotic symptoms present at this time. She denies any auditory or visual hallucination. Her insight judgment is fair and her impulse control is okay.  Lab Results:  Results for orders placed or performed during  the hospital encounter of 12/31/15 (from the past 8736 hour(s))  Surgical pcr screen   Collection Time: 12/31/15 10:57 AM  Result Value Ref Range   MRSA, PCR NEGATIVE NEGATIVE   Staphylococcus aureus NEGATIVE NEGATIVE  CBC   Collection Time: 12/31/15 10:57 AM  Result Value Ref Range   WBC 4.7 4.0 - 10.5 K/uL   RBC 4.41 3.87 - 5.11 MIL/uL   Hemoglobin 12.6 12.0 - 15.0 g/dL   HCT 16.1 09.6 - 04.5 %   MCV 87.5 78.0 - 100.0 fL   MCH 28.6 26.0 - 34.0 pg   MCHC 32.6 30.0 - 36.0 g/dL   RDW 40.9 81.1 - 91.4 %   Platelets 153 150 - 400 K/uL  Basic metabolic panel   Collection Time: 12/31/15 10:57 AM  Result Value Ref Range   Sodium 142 135 - 145 mmol/L   Potassium 3.9 3.5 - 5.1 mmol/L   Chloride 107 101 - 111 mmol/L   CO2 26 22 - 32 mmol/L   Glucose, Bld 105 (H) 65 - 99 mg/dL   BUN 14 6 - 20 mg/dL   Creatinine, Ser 7.82 0.44 - 1.00 mg/dL   Calcium 8.8 (L) 8.9 - 10.3 mg/dL   GFR calc non Af Amer >60 >60 mL/min   GFR calc Af Amer >60 >60 mL/min   Anion gap 9 5 - 15   Assessment Axis I bipolar disorder NOS, benzodiazepine abuse, alcohol abuse Axis II deferred Axis III see medical history Axis IV moderate Axis V 60-65  Plan/Discussion: She'll Continue Ambien 10 mg at bedtime for sleep,, continue Xanax for anxiety  0.5 mg in the morning and 1 mg at bedtime and adding other 0.5 mg in the afternoon and Depakote for mood disorder. She will continue Lexapro 20 mg daily for depression She'll return to see me in 3 months. We have tried to schedule her for counseling here but she is not consistent in follow-up.  Time spent 15 minutes.  More than 50% of the time spent in psychoeducation, counseling and coordination of care.  Discuss safety plan that anytime having active suicidal thoughts or homicidal thoughts then patient need to call 911 or go to the local emergency room.  MEDICATIONS this encounter: Meds ordered this encounter  Medications  . levothyroxine (SYNTHROID, LEVOTHROID) 175 MCG  tablet    Sig: Take 175 mcg by mouth daily.  Refill:  1  . escitalopram (LEXAPRO) 20 MG tablet    Sig: Take 1 tablet (20 mg total) by mouth daily.    Dispense:  30 tablet    Refill:  2  . divalproex (DEPAKOTE) 250 MG DR tablet    Sig: TAKE THREE (3) TABLETS BY MOUTH AT BEDTIME    Dispense:  90 tablet    Refill:  0  . ALPRAZolam (XANAX) 0.5 MG tablet    Sig: Take one twice a day and 2 at bedtime    Dispense:  120 tablet    Refill:  2  . zolpidem (AMBIEN) 10 MG tablet    Sig: Take 1 tablet (10 mg total) by mouth at bedtime as needed for sleep.    Dispense:  30 tablet    Refill:  2    Medical Decision Making Problem Points:  Established problem, stable/improving (1), Established problem, worsening (2), New problem, with no additional work-up planned (3), Review of last therapy session (1) and Review of psycho-social stressors (1) Data Points:  Review or order clinical lab tests (1) Review and summation of old records (2) Review of medication regiment & side effects (2) Review of new medications or change in dosage (2)  Keeley Sussman, MDPatient ID: Deretha EmoryIris M Ham, female   DOB: Sep 24, 1950, 66 y.o.   MRN: [id number]

## 2016-11-10 ENCOUNTER — Other Ambulatory Visit: Payer: Self-pay | Admitting: Orthopedic Surgery

## 2016-11-10 DIAGNOSIS — M1711 Unilateral primary osteoarthritis, right knee: Secondary | ICD-10-CM

## 2016-11-17 ENCOUNTER — Encounter: Payer: Self-pay | Admitting: Gastroenterology

## 2016-11-17 ENCOUNTER — Ambulatory Visit (INDEPENDENT_AMBULATORY_CARE_PROVIDER_SITE_OTHER): Payer: PPO | Admitting: Gastroenterology

## 2016-11-17 VITALS — BP 121/69 | HR 75 | Temp 98.0°F | Ht 63.0 in | Wt 209.6 lb

## 2016-11-17 DIAGNOSIS — K5900 Constipation, unspecified: Secondary | ICD-10-CM

## 2016-11-17 NOTE — Progress Notes (Signed)
Referring Provider: Elfredia Nevins, MD Primary Care Physician:  Cassell Smiles., MD Primary GI: Dr. Jena Gauss   Chief Complaint  Patient presents with  . Diarrhea    HPI:   Diane [name] is a 66 y.o. female presenting today with a history of GERD and constipation.    Will urinate, start having BMs that gush out. Tried to stop Amitiza 24 mcg and ended up not going for 3 days. Will go about 4 times back to back. Taking Amitiza 24 mcg BID. Thought about just cutting back and only taking one but didn't help. Will cramp a lot at the end. At the end after being on the toilet for awhile will come out like water. Not taking any fiber.    Past Medical History:  Diagnosis Date  . Allergic rhinitis   . Anxiety   . Arthritis   . Cataracts, bilateral   . Chronic back pain    Sees pain mamagement clinic  . Depression   . GERD (gastroesophageal reflux disease)   . Headaches, cluster 3 yrs ago   last one  . High cholesterol   . Hypertension   . Hypothyroid   . Immature cataract   . Mania (HCC)   . PTSD (post-traumatic stress disorder)   . Seizures (HCC) 20 yrs ago   unknown etiology-on meds  . Sleep apnea    STOP BANG score=4  . 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Past Surgical History:  Procedure Laterality Date  . BACK SURGERY    . CATARACT EXTRACTION W/PHACO Left 01/04/2016   Procedure: CATARACT EXTRACTION PHACO AND INTRAOCULAR LENS PLACEMENT (IOC);  Surgeon: Jethro Bolus, MD;  Location: [facility];  Service: Ophthalmology;  Laterality: Left;  CDE:4.70  . CATARACT EXTRACTION W/PHACO Right 01/18/2016   Procedure: CATARACT EXTRACTION PHACO AND INTRAOCULAR LENS PLACEMENT RIGHT EYE; CDE:  4.66;  Surgeon: Jethro Bolus, MD;  Location: [facility];  Service: Ophthalmology;  Laterality: Right;  . CHOLECYSTECTOMY    . COLONOSCOPY  10/16/2008   ZOX:[id number], otherwise normal rectum; pancolonic diverticula the remainder of the colonic mucosa appeared normal. Next TCS due 10/2013.  Marland Kitchen COLONOSCOPY   06/28/2006   RMR: Internal hemorrhoids. Diminutive rectal polyps, cold biopsied/ Pancolonic diverticula. Polyps in the right colon removed   . COLONOSCOPY WITH PROPOFOL N/A 05/20/2015   RMR: Pancolonic diverticulosis. Redundant colon   . ESOPHAGOGASTRODUODENOSCOPY  10/16/2008   XBJ:YNWGN hiatal hernia otherwise normal esophagus, stomach  D1, D2, status post passage of a 56-French Maloney dilator  . ESOPHAGOGASTRODUODENOSCOPY  06/28/2006   FAO:ZHYQMV esophagus. Small hiatal hernia. Otherwise normal stomach, D1 and D2, status post passage of a 56 [place] Maloney dilator  . ESOPHAGOGASTRODUODENOSCOPY (EGD) WITH ESOPHAGEAL DILATION N/A 07/14/2013   RMR: Abnormal esophagus of uncertain significance-status post esophageal biospy. small hiatal hernia.   Marland Kitchen ESOPHAGOGASTRODUODENOSCOPY (EGD) WITH PROPOFOL N/A 05/20/2015   RMR: NORMAL EGD status post maloney dilation   . FOOT SURGERY     left foot-  . INCISION AND DRAINAGE Right 01/03/2013   Procedure: INCISION AND DRAINAGE;  Surgeon: Vickki Hearing, MD;  Location: [facility];  Service: Orthopedics;  Laterality: Right;  . KNEE ARTHROSCOPY  01/2012   right  . KNEE ARTHROSCOPY WITH LATERAL RELEASE Right 01/03/2013   Procedure: Lateral Release Patella Right Knee;  Surgeon: Vickki Hearing, MD;  Location: [facility];  Service: Orthopedics;  Laterality: Right;  . left foot  x 2  . LUMBAR DISC SURGERY  L4-5  . MALONEY DILATION N/A  05/20/2015   Procedure: Elease Hashimoto DILATION;  Surgeon: Corbin Ade, MD;  Location: [facility];  Service: Endoscopy;  Laterality: N/A;  Maloney dilator # 54  . oophoectomy  bilateral  . PARTIAL HYSTERECTOMY    . TOTAL KNEE ARTHROPLASTY  07/01/2012   Procedure: TOTAL KNEE ARTHROPLASTY;  Surgeon: Vickki Hearing, MD;  Location: [facility];  Service: Orthopedics;  Laterality: Right;  . TOTAL KNEE REVISION Right 01/03/2013   Procedure: Patellaplasty Right Knee;  Surgeon: Vickki Hearing, MD;  Location: [facility];  Service: Orthopedics;  Laterality:  Right;    Current Outpatient Prescriptions  Medication Sig Dispense Refill  . ALPRAZolam (XANAX) 0.5 MG tablet Take one twice a day and 2 at bedtime 120 tablet 2  . AMITIZA 24 MCG capsule TAKE ONE CAPSULE BY MOUTH TWICE DAILY WITH A MEAL. 60 capsule 5  . amLODipine (NORVASC) 5 MG tablet Take 5 mg by mouth daily.    . cyclobenzaprine (FLEXERIL) 10 MG tablet Take 10 mg by mouth 3 (three) times daily as needed for muscle spasms.    . divalproex (DEPAKOTE) 250 MG DR tablet TAKE THREE (3) TABLETS BY MOUTH AT BEDTIME 90 tablet 0  . escitalopram (LEXAPRO) 20 MG tablet Take 1 tablet (20 mg total) by mouth daily. 30 tablet 2  . levothyroxine (SYNTHROID, LEVOTHROID) 175 MCG tablet Take 175 mcg by mouth daily.  1  . lisinopril (PRINIVIL,ZESTRIL) 10 MG tablet Take 10 mg by mouth daily.    Marland Kitchen oxybutynin (DITROPAN-XL) 5 MG 24 hr tablet Take 5 mg by mouth at bedtime.    Marland Kitchen oxyCODONE-acetaminophen (PERCOCET) 7.5-325 MG tablet Take 1 tablet by mouth every 6 (six) hours as needed. 56 tablet 0  . pantoprazole (PROTONIX) 40 MG tablet TAKE ONE TABLET BY MOUTH DAILY. 90 tablet 3  . rosuvastatin (CRESTOR) 40 MG tablet Take 40 mg by mouth daily.      Marland Kitchen zolpidem (AMBIEN) 10 MG tablet Take 1 tablet (10 mg total) by mouth at bedtime as needed for sleep. 30 tablet 2   No current facility-administered medications for this visit.     Allergies as of 11/17/2016 - Review Complete 11/17/2016  Allergen Reaction Noted  . Neomycin-bacitracin zn-polymyx Other (See Comments) 02/27/2008  . Penicillins Hives   . Sulfonamide derivatives Hives and Swelling 02/27/2008    Family History  Problem Relation Age of Onset  . Heart disease    . Diabetes    . Alcohol abuse    . Alcohol abuse Mother   . Anxiety disorder Mother   . Alcohol abuse Father   . Anxiety disorder Father   . Bipolar disorder Sister   . Alcohol abuse Brother   . Anxiety disorder Brother   . Drug abuse Brother   . Dementia Paternal Aunt   . Depression  Neg Hx   . OCD Neg Hx   . Paranoid behavior Neg Hx   . Schizophrenia Neg Hx   . Sexual abuse Neg Hx   . Physical abuse Neg Hx   . ADD / ADHD Grandchild   . ADD / ADHD Grandchild   . Drug abuse Brother   . Alcohol abuse Brother   . Anxiety disorder Brother   . Seizures Brother   . Alcohol abuse Brother   . Anxiety disorder Brother   . Seizures Brother   . Alcohol abuse Brother   . Anxiety disorder Brother   . Alcohol abuse Brother   . Anxiety disorder Brother   . Colon cancer Neg  Hx     Social History   Social History  . Marital status: Married    Spouse name: N/A  . Number of children: N/A  . Years of education: 12th grade   Occupational History  . disabled Unemployed   Social History Main Topics  . Smoking status: Former Smoker    Packs/day: 0.25    Years: 5.00    Types: Cigarettes    Quit date: 06/26/2003  . Smokeless tobacco: Never Used  . Alcohol use 0.0 oz/week     Comment: occasionally; once a month.  . Drug use: No     Comment: has a past history of street drug use  . Sexual activity: No   Other Topics Concern  . Not on file   Social History Narrative  . No narrative on file    Review of Systems: As mentioned in HPI   Physical Exam: BP 121/69   Pulse 75   Temp 98 F (36.7 C) (Oral)   Ht 5\' 3"  (1.6 m)   Wt 209 lb 9.6 oz (95.1 kg)   BMI 37.13 kg/m  General:   Alert and oriented. No distress noted. Pleasant and cooperative.  Head:  Normocephalic and atraumatic. Eyes:  Conjuctiva clear without scleral icterus. Abdomen:  +BS, soft, non-tender and non-distended. No rebound or guarding. No HSM or masses noted. Msk:  Symmetrical without gross deformities. Normal posture. Extremities:  Without edema. Neurologic:  Alert and  oriented x4;  grossly normal neurologically. Psych:  Alert and cooperative. Normal mood and affect.

## 2016-11-17 NOTE — Patient Instructions (Signed)
I've decreased your dosing to Amitiza twice a day with food. If needed, you could even cut this down to once a day. Let me know how these samples work for you.  Start taking Metamucil or benefiber daily.   We will see you in 3 months!

## 2016-11-20 MED ORDER — OXYCODONE-ACETAMINOPHEN 7.5-325 MG PO TABS: 1.0000 | ORAL_TABLET | Freq: Four times a day (QID) | ORAL | 0 refills | Status: DC | PRN

## 2016-11-20 NOTE — Assessment & Plan Note (Signed)
Not ideally managed with Amitiza 24 mcg BID, with reports of loose stool with stronger dosing and dealing with constipation when Amitiza held. Will decrease dosing to 8 mcg BID. May even take the novel approach of once a day if needed. Add supplemental fiber. Return in 3 months.

## 2016-11-21 ENCOUNTER — Other Ambulatory Visit: Payer: Self-pay | Admitting: Orthopedic Surgery

## 2016-11-21 ENCOUNTER — Telehealth: Payer: Self-pay | Admitting: Orthopedic Surgery

## 2016-11-21 DIAGNOSIS — M1711 Unilateral primary osteoarthritis, right knee: Secondary | ICD-10-CM

## 2016-11-21 MED ORDER — OXYCODONE-ACETAMINOPHEN 7.5-325 MG PO TABS: 1.0000 | ORAL_TABLET | Freq: Four times a day (QID) | ORAL | 0 refills | Status: DC | PRN

## 2016-11-21 NOTE — Telephone Encounter (Signed)
Oxycodone-Acetaminophen  7.5/325mg    Take 1 tablet by mouth every 6 (six) hours as needed.    It shows in system it was done, but its not up front. Pt has called several times

## 2016-11-21 NOTE — Telephone Encounter (Signed)
ROUTING TO DR HARRISON 

## 2016-11-21 NOTE — Progress Notes (Signed)
[place] controlled substance reporting system reviewed  

## 2016-11-21 NOTE — Progress Notes (Signed)
cc'ed to pcp °

## 2016-12-03 ENCOUNTER — Other Ambulatory Visit: Payer: Self-pay | Admitting: Orthopedic Surgery

## 2016-12-03 DIAGNOSIS — M1711 Unilateral primary osteoarthritis, right knee: Secondary | ICD-10-CM

## 2016-12-04 MED ORDER — OXYCODONE-ACETAMINOPHEN 7.5-325 MG PO TABS: 1.0000 | ORAL_TABLET | Freq: Four times a day (QID) | ORAL | 0 refills | Status: DC | PRN

## 2016-12-14 ENCOUNTER — Ambulatory Visit (HOSPITAL_COMMUNITY)
Admission: RE | Admit: 2016-12-14 | Discharge: 2016-12-14 | Disposition: A | Discharge status: Home / Self Care | Payer: PPO | Source: Ambulatory Visit | Attending: Family Medicine | Admitting: Family Medicine

## 2016-12-14 DIAGNOSIS — Z1231 Encounter for screening mammogram for malignant neoplasm of breast: Secondary | ICD-10-CM | POA: Insufficient documentation

## 2016-12-15 ENCOUNTER — Other Ambulatory Visit: Payer: Self-pay | Admitting: Orthopedic Surgery

## 2016-12-15 DIAGNOSIS — M1711 Unilateral primary osteoarthritis, right knee: Secondary | ICD-10-CM

## 2016-12-18 ENCOUNTER — Other Ambulatory Visit: Payer: Self-pay | Admitting: Orthopedic Surgery

## 2016-12-18 DIAGNOSIS — M1711 Unilateral primary osteoarthritis, right knee: Secondary | ICD-10-CM

## 2016-12-18 MED ORDER — OXYCODONE-ACETAMINOPHEN 7.5-325 MG PO TABS: 1.0000 | ORAL_TABLET | Freq: Four times a day (QID) | ORAL | 0 refills | Status: DC | PRN

## 2016-12-18 NOTE — Progress Notes (Signed)
[place] controlled substance reporting system reviewed  

## 2016-12-29 ENCOUNTER — Other Ambulatory Visit: Payer: Self-pay | Admitting: Orthopedic Surgery

## 2016-12-29 DIAGNOSIS — M1711 Unilateral primary osteoarthritis, right knee: Secondary | ICD-10-CM

## 2016-12-29 MED ORDER — OXYCODONE-ACETAMINOPHEN 7.5-325 MG PO TABS: 1.0000 | ORAL_TABLET | Freq: Four times a day (QID) | ORAL | 0 refills | Status: DC | PRN

## 2017-01-03 ENCOUNTER — Other Ambulatory Visit: Payer: Self-pay | Admitting: Gastroenterology

## 2017-01-03 DIAGNOSIS — K219 Gastro-esophageal reflux disease without esophagitis: Secondary | ICD-10-CM

## 2017-01-15 ENCOUNTER — Other Ambulatory Visit: Payer: Self-pay | Admitting: Orthopedic Surgery

## 2017-01-15 DIAGNOSIS — M1711 Unilateral primary osteoarthritis, right knee: Secondary | ICD-10-CM

## 2017-01-15 MED ORDER — OXYCODONE-ACETAMINOPHEN 7.5-325 MG PO TABS: 1.0000 | ORAL_TABLET | Freq: Four times a day (QID) | ORAL | 0 refills | Status: DC | PRN

## 2017-01-17 ENCOUNTER — Other Ambulatory Visit: Payer: Self-pay

## 2017-01-17 DIAGNOSIS — K219 Gastro-esophageal reflux disease without esophagitis: Secondary | ICD-10-CM

## 2017-01-17 MED ORDER — PANTOPRAZOLE SODIUM 40 MG PO TBEC: 40.0000 mg | DELAYED_RELEASE_TABLET | Freq: Every day | ORAL | 3 refills | Status: DC

## 2017-01-18 MED ORDER — PANTOPRAZOLE SODIUM 40 MG PO TBEC
40.0000 mg | DELAYED_RELEASE_TABLET | Freq: Two times a day (BID) | ORAL | 2 refills | Status: DC
Start: 2017-01-18 — End: 2017-04-05

## 2017-01-25 ENCOUNTER — Encounter (HOSPITAL_COMMUNITY): Payer: Self-pay | Admitting: Psychiatry

## 2017-01-25 ENCOUNTER — Ambulatory Visit (INDEPENDENT_AMBULATORY_CARE_PROVIDER_SITE_OTHER): Payer: PPO | Admitting: Psychiatry

## 2017-01-25 VITALS — BP 138/63 | HR 57 | Ht 63.0 in | Wt 210.0 lb

## 2017-01-25 DIAGNOSIS — F1010 Alcohol abuse, uncomplicated: Secondary | ICD-10-CM | POA: Diagnosis not present

## 2017-01-25 DIAGNOSIS — Z79899 Other long term (current) drug therapy: Secondary | ICD-10-CM | POA: Diagnosis not present

## 2017-01-25 DIAGNOSIS — F3130 Bipolar disorder, current episode depressed, mild or moderate severity, unspecified: Secondary | ICD-10-CM | POA: Diagnosis not present

## 2017-01-25 DIAGNOSIS — F1310 Sedative, hypnotic or anxiolytic abuse, uncomplicated: Secondary | ICD-10-CM

## 2017-01-25 DIAGNOSIS — Z811 Family history of alcohol abuse and dependence: Secondary | ICD-10-CM | POA: Diagnosis not present

## 2017-01-25 DIAGNOSIS — F5105 Insomnia due to other mental disorder: Secondary | ICD-10-CM

## 2017-01-25 DIAGNOSIS — Z813 Family history of other psychoactive substance abuse and dependence: Secondary | ICD-10-CM | POA: Diagnosis not present

## 2017-01-25 DIAGNOSIS — Z818 Family history of other mental and behavioral disorders: Secondary | ICD-10-CM

## 2017-01-25 MED ORDER — DIVALPROEX SODIUM 250 MG PO DR TAB: DELAYED_RELEASE_TABLET | ORAL | 0 refills | Status: DC

## 2017-01-25 MED ORDER — ALPRAZOLAM 0.5 MG PO TABS: ORAL_TABLET | ORAL | 2 refills | Status: DC

## 2017-01-25 MED ORDER — ESCITALOPRAM OXALATE 20 MG PO TABS: 20.0000 mg | ORAL_TABLET | Freq: Every day | ORAL | 2 refills | Status: DC

## 2017-01-25 MED ORDER — ZOLPIDEM TARTRATE 10 MG PO TABS: 10.0000 mg | ORAL_TABLET | Freq: Every evening | ORAL | 2 refills | Status: DC | PRN

## 2017-01-25 NOTE — Progress Notes (Signed)
Patient ID: Diane [name], female   DOB: [birth year], 66 y.o.   MRN: [id number] Patient ID: Diane [name], female   DOB: 1951/02/01, 66 y.o.   MRN: [id number] Patient ID: Diane [name], female   DOB: Jan 25, 1951, 66 y.o.   MRN: [id number] Patient ID: Diane [name], female   DOB: 1951-08-16, 66 y.o.   MRN: [id number] Patient ID: Diane [name], female   DOB: 1950/12/08, 65 y.o.   MRN: [id number] Patient ID: Diane [name], female   DOB: 01/08/1951, 66 y.o.   MRN: [id number] Patient ID: Diane [name], female   DOB: Jul 20, 1951, 66 y.o.   MRN: [id number] Patient ID: Diane [name], female   DOB: 1951-04-13, 66 y.o.   MRN: [id number] Patient ID: Diane [name], female   DOB: 1951/04/17, 66 y.o.   MRN: [id number] Patient ID: Diane [name], female   DOB: 12-Jun-1951, 66 y.o.   MRN: [id number] Patient ID: Diane [name], female   DOB: October 21, 1950, 66 y.o.   MRN: [id number] [facility] [id number] Progress Note Diane [name] MRN: [id number] DOB: 02-01-1951 Age: 66 y.o.  Date: 01/25/2017  Chief Complaint  Patient presents with  . Depression  . Anxiety  . Follow-up    History of present illness Patient is 66 year old [place] American female who came for her followup appointment. She is currently with her grandson in [place] and is on disability  The patient was last seen in March. At that time she told me she was moving to [place] to be with her daughter. She didn't like living down there and she and her grandson have come back. Her grandson got his own place to live and she is now staying in a motel temporarily. Her mood is "up and down" because she is unsure where she is going to live. She's in a lot of pain in her knee and would like to get back on the amitriptyline since she ran out. A nurse practitioner and primary care put her on Celexa without realizing that she's already on Cymbalta. I explained to her that made no sense to be on both. She does admit that she still drinks periodically sometimes  too much on weekends. I warned her against this. She denies being severely depressed or suicidal  The patient returns after 3 months. For the most part she is doing okay. She states that her boyfriend is living with her and they're getting along fairly well. Her grandson is about ready to move back to [place]. Her boyfriend works a lot and sometimes she gets lonely but she is able to get out and see friends. Her mood is generally stable. The Xanax helps with her anxiety and Ambien continues to help her sleep we've not checked a Depakote level or liver function tests in a while so we will get these today   Past psychiatric history Patient has history of one psychiatric admission in [facility]. She took overdose on her pills she was also intoxicated with alcohol. Patient has also seen by psychiatrist in 2004. At that time she was seeking more benzodiazepines and pain medication.  Vitals: BP 138/63   Pulse (!) 57   Ht 5\' 3"  (1.6 m)   Wt 210 lb (95.3 kg)   BMI 37.20 kg/m   Allergies:  Medical History: Past Medical History:  Diagnosis Date  . Allergic rhinitis   . Anxiety   . Arthritis   . Cataracts, bilateral   . Chronic back  pain    Sees pain mamagement clinic  . Depression   . GERD (gastroesophageal reflux disease)   . Headaches, cluster 3 yrs ago   last one  . High cholesterol   . Hypertension   . Hypothyroid   . Immature cataract   . Mania (HCC)   . PTSD (post-traumatic stress disorder)   . Seizures (HCC) 20 yrs ago   unknown etiology-on meds  . Sleep apnea    STOP BANG score=4  . 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Patient has history of hypertension, allergic rhinitis, hypothyroidism, chronic back pain, hyperlipidemia and obesity.  She most recently had surgery on her right knee.  Surgical History: Past Surgical History:  Procedure Laterality Date  . BACK SURGERY    . CATARACT EXTRACTION W/PHACO Left 01/04/2016   Procedure: CATARACT  EXTRACTION PHACO AND INTRAOCULAR LENS PLACEMENT (IOC);  Surgeon: Jethro Bolus, MD;  Location: [facility];  Service: Ophthalmology;  Laterality: Left;  CDE:4.70  . CATARACT EXTRACTION W/PHACO Right 01/18/2016   Procedure: CATARACT EXTRACTION PHACO AND INTRAOCULAR LENS PLACEMENT RIGHT EYE; CDE:  4.66;  Surgeon: Jethro Bolus, MD;  Location: [facility];  Service: Ophthalmology;  Laterality: Right;  . CHOLECYSTECTOMY    . COLONOSCOPY  10/16/2008   [id number], otherwise normal rectum; pancolonic diverticula the remainder of the colonic mucosa appeared normal. Next TCS due 10/2013.  Marland Kitchen COLONOSCOPY  06/28/2006   RMR: Internal hemorrhoids. Diminutive rectal polyps, cold biopsied/ Pancolonic diverticula. Polyps in the right colon removed   . COLONOSCOPY WITH PROPOFOL N/A 05/20/2015   RMR: Pancolonic diverticulosis. Redundant colon   . ESOPHAGOGASTRODUODENOSCOPY  10/16/2008   XBJ:YNWGN hiatal hernia otherwise normal esophagus, stomach  D1, D2, status post passage of a 56-French Maloney dilator  . ESOPHAGOGASTRODUODENOSCOPY  06/28/2006   FAO:ZHYQMV esophagus. Small hiatal hernia. Otherwise normal stomach, D1 and D2, status post passage of a 56 [place] Maloney dilator  . ESOPHAGOGASTRODUODENOSCOPY (EGD) WITH ESOPHAGEAL DILATION N/A 07/14/2013   RMR: Abnormal esophagus of uncertain significance-status post esophageal biospy. small hiatal hernia.   Marland Kitchen ESOPHAGOGASTRODUODENOSCOPY (EGD) WITH PROPOFOL N/A 05/20/2015   RMR: NORMAL EGD status post maloney dilation   . FOOT SURGERY     left foot-  . INCISION AND DRAINAGE Right 01/03/2013   Procedure: INCISION AND DRAINAGE;  Surgeon: Vickki Hearing, MD;  Location: [facility];  Service: Orthopedics;  Laterality: Right;  . KNEE ARTHROSCOPY  01/2012   right  . KNEE ARTHROSCOPY WITH LATERAL RELEASE Right 01/03/2013   Procedure: Lateral Release Patella Right Knee;  Surgeon: Vickki Hearing, MD;  Location: [facility];  Service: Orthopedics;  Laterality: Right;  . left foot  x 2  .  LUMBAR DISC SURGERY  L4-5  . MALONEY DILATION N/A 05/20/2015   Procedure: Elease Hashimoto DILATION;  Surgeon: Corbin Ade, MD;  Location: [facility];  Service: Endoscopy;  Laterality: N/A;  Maloney dilator # 54  . oophoectomy  bilateral  . PARTIAL HYSTERECTOMY    . TOTAL KNEE ARTHROPLASTY  07/01/2012   Procedure: TOTAL KNEE ARTHROPLASTY;  Surgeon: Vickki Hearing, MD;  Location: [facility];  Service: Orthopedics;  Laterality: Right;  . TOTAL KNEE REVISION Right 01/03/2013   Procedure: Patellaplasty Right Knee;  Surgeon: Vickki Hearing, MD;  Location: [facility];  Service: Orthopedics;  Laterality: Right;   Family History: family history includes ADD / ADHD in her grandchild and grandchild; Alcohol abuse in her brother, brother, brother, brother, brother, father, and mother; Anxiety disorder in her brother, brother, brother, brother, brother, father,  and mother; Bipolar disorder in her sister; Dementia in her paternal aunt; Drug abuse in her brother and brother; Seizures in her brother and brother.   Psychosocial history Patient has been married for 16 years. She has 2 children. She is married however her relationship is very tense. She has history of leaving her husband in the past.  Currently she lives by herself.  Mental status examination Patient is mildly obese female who is casually dressed and fairly groomed.  .  She is cooperative and maintained fair eye contact her mood is Fairly good and Her affect is brighter She denies suicidal ideation . Her speech is clear and coherent. Her attention and concentration is fair. . She's alert and oriented x3. There are no psychotic symptoms present at this time. She denies any auditory or visual hallucination. Her insight judgment is fair and her impulse control is okay.  Lab Results:  No results found for this or any previous visit (from the past 8736 hour(s)). Assessment Axis I bipolar disorder NOS, benzodiazepine abuse, alcohol abuse Axis II deferred Axis  III see medical history Axis IV moderate Axis V 60-65  Plan/Discussion: She'll Continue Ambien 10 mg at bedtime for sleep,, continue Xanax for anxiety  0.5 mg in the morning and 1 mg at bedtime and adding other 0.5 mg in the afternoon and Depakote for mood disorder. She will continue Lexapro 20 mg daily for depression. We will get a Depakote level and liver function tests today She'll return to see me in 3 months. We have tried to schedule her for counseling here but she is not consistent in follow-up.  Time spent 15 minutes.  More than 50% of the time spent in psychoeducation, counseling and coordination of care.  Discuss safety plan that anytime having active suicidal thoughts or homicidal thoughts then patient need to call 911 or go to the local emergency room.  MEDICATIONS this encounter: Meds ordered this encounter  Medications  . divalproex (DEPAKOTE) 250 MG DR tablet    Sig: TAKE THREE (3) TABLETS BY MOUTH AT BEDTIME    Dispense:  90 tablet    Refill:  0  . escitalopram (LEXAPRO) 20 MG tablet    Sig: Take 1 tablet (20 mg total) by mouth daily.    Dispense:  30 tablet    Refill:  2  . zolpidem (AMBIEN) 10 MG tablet    Sig: Take 1 tablet (10 mg total) by mouth at bedtime as needed for sleep.    Dispense:  30 tablet    Refill:  2  . ALPRAZolam (XANAX) 0.5 MG tablet    Sig: Take one twice a day and 2 at bedtime    Dispense:  120 tablet    Refill:  2    Medical Decision Making Problem Points:  Established problem, stable/improving (1), Established problem, worsening (2), New problem, with no additional work-up planned (3), Review of last therapy session (1) and Review of psycho-social stressors (1) Data Points:  Review or order clinical lab tests (1) Review and summation of old records (2) Review of medication regiment & side effects (2) Review of new medications or change in dosage (2)  Kasey Hansell, MDPatient ID: Diane EmoryIris M [name], female   DOB: Feb 02, 1951, 66 y.o.   MRN:  [id number]

## 2017-01-26 ENCOUNTER — Other Ambulatory Visit: Payer: Self-pay | Admitting: Orthopedic Surgery

## 2017-01-26 DIAGNOSIS — M1711 Unilateral primary osteoarthritis, right knee: Secondary | ICD-10-CM

## 2017-01-26 MED ORDER — OXYCODONE-ACETAMINOPHEN 7.5-325 MG PO TABS: 1.0000 | ORAL_TABLET | Freq: Four times a day (QID) | ORAL | 0 refills | Status: DC | PRN

## 2017-01-29 ENCOUNTER — Other Ambulatory Visit: Payer: Self-pay | Admitting: *Deleted

## 2017-01-29 ENCOUNTER — Ambulatory Visit (HOSPITAL_COMMUNITY): Payer: Self-pay | Admitting: Psychiatry

## 2017-01-29 DIAGNOSIS — R52 Pain, unspecified: Secondary | ICD-10-CM

## 2017-01-30 ENCOUNTER — Other Ambulatory Visit: Payer: Self-pay | Admitting: Orthopedic Surgery

## 2017-01-30 DIAGNOSIS — R52 Pain, unspecified: Secondary | ICD-10-CM | POA: Diagnosis not present

## 2017-01-30 DIAGNOSIS — F3130 Bipolar disorder, current episode depressed, mild or moderate severity, unspecified: Secondary | ICD-10-CM | POA: Diagnosis not present

## 2017-01-30 LAB — HEPATIC FUNCTION PANEL
ALT: 12 U/L (ref 6–29)
AST: 12 U/L (ref 10–35)
Albumin: 3.4 g/dL — ABNORMAL LOW (ref 3.6–5.1)
Alkaline Phosphatase: 63 U/L (ref 33–130)
Bilirubin, Direct: 0.1 mg/dL (ref <=0.2)
Indirect Bilirubin: 0.3 mg/dL (ref 0.2–1.2)
Total Bilirubin: 0.4 mg/dL (ref 0.2–1.2)
Total Protein: 6.6 g/dL (ref 6.1–8.1)

## 2017-01-30 LAB — VALPROIC ACID LEVEL: Valproic Acid Lvl: 71.5 mg/L (ref 50.0–100.0)

## 2017-01-31 LAB — PAIN MGMT, PROFILE 1 W/O CONF, U
Amphetamines: NEGATIVE ng/mL (ref <500)
Barbiturates: NEGATIVE ng/mL (ref <300)
Benzodiazepines: POSITIVE ng/mL — AB (ref <100)
Cocaine Metabolite: NEGATIVE ng/mL (ref <150)
Creatinine: 196.9 mg/dL (ref >=20.0)
Marijuana Metabolite: NEGATIVE ng/mL (ref <20)
Methadone Metabolite: NEGATIVE ng/mL (ref <100)
Opiates: NEGATIVE ng/mL (ref <100)
Oxidant: NEGATIVE ug/mL (ref <200)
Oxycodone: POSITIVE ng/mL — AB (ref <100)
Phencyclidine: NEGATIVE ng/mL (ref <25)
pH: 8.35 (ref 4.5–9.0)

## 2017-02-09 ENCOUNTER — Encounter: Payer: Self-pay | Admitting: Orthopedic Surgery

## 2017-02-09 ENCOUNTER — Ambulatory Visit (INDEPENDENT_AMBULATORY_CARE_PROVIDER_SITE_OTHER): Payer: PPO | Admitting: Orthopedic Surgery

## 2017-02-09 DIAGNOSIS — Z5181 Encounter for therapeutic drug level monitoring: Secondary | ICD-10-CM | POA: Diagnosis not present

## 2017-02-09 DIAGNOSIS — M51369 Other intervertebral disc degeneration, lumbar region without mention of lumbar back pain or lower extremity pain: Secondary | ICD-10-CM

## 2017-02-09 DIAGNOSIS — M5136 Other intervertebral disc degeneration, lumbar region: Secondary | ICD-10-CM | POA: Diagnosis not present

## 2017-02-09 DIAGNOSIS — Z96651 Presence of right artificial knee joint: Secondary | ICD-10-CM

## 2017-02-09 DIAGNOSIS — Z79891 Long term (current) use of opiate analgesic: Secondary | ICD-10-CM | POA: Diagnosis not present

## 2017-02-09 DIAGNOSIS — G894 Chronic pain syndrome: Secondary | ICD-10-CM | POA: Diagnosis not present

## 2017-02-09 DIAGNOSIS — M1711 Unilateral primary osteoarthritis, right knee: Secondary | ICD-10-CM

## 2017-02-09 MED ORDER — CYCLOBENZAPRINE HCL 10 MG PO TABS: 10.0000 mg | ORAL_TABLET | Freq: Three times a day (TID) | ORAL | 5 refills | Status: DC | PRN

## 2017-02-09 MED ORDER — OXYCODONE-ACETAMINOPHEN 7.5-325 MG PO TABS: 1.0000 | ORAL_TABLET | Freq: Four times a day (QID) | ORAL | 0 refills | Status: DC | PRN

## 2017-02-09 NOTE — Progress Notes (Signed)
Follow up   Chief Complaint  Patient presents with  . Follow-up    chronic pain, refill   Encounter Diagnoses  Name Primary?  . History of total right knee replacement   . Degenerative disc disease, lumbar   . Chronic pain syndrome   . Encounter for monitoring opioid maintenance therapy Yes  . Arthritis of knee, right     Diane [name] presents for 3 month chronic pain management opioid management follow-up  She says she is functioning well with oxycodone and Flexeril. She does take Xanax at bedtime to help with her sleep and then twice during the day for anxiety  She is able to function do her housework cleaning light shopping.  Review of systems she does get some constipation for which she takes over-the-counter medication   Her degenerative disc disease continues to cause her some discomfort as well and she takes Flexeril for that.  Current Outpatient Prescriptions:  .  ALPRAZolam (XANAX) 0.5 MG tablet, Take one twice a day and 2 at bedtime, Disp: 120 tablet, Rfl: 2 .  AMITIZA 24 MCG capsule, TAKE ONE CAPSULE BY MOUTH TWICE DAILY WITH A MEAL., Disp: 60 capsule, Rfl: 5 .  amLODipine (NORVASC) 5 MG tablet, Take 5 mg by mouth daily., Disp: , Rfl:  .  cyclobenzaprine (FLEXERIL) 10 MG tablet, Take 10 mg by mouth 3 (three) times daily as needed for muscle spasms., Disp: , Rfl:  .  divalproex (DEPAKOTE) 250 MG DR tablet, TAKE THREE (3) TABLETS BY MOUTH AT BEDTIME, Disp: 90 tablet, Rfl: 0 .  escitalopram (LEXAPRO) 20 MG tablet, Take 1 tablet (20 mg total) by mouth daily., Disp: 30 tablet, Rfl: 2 .  levothyroxine (SYNTHROID, LEVOTHROID) 175 MCG tablet, Take 175 mcg by mouth daily., Disp: , Rfl: 1 .  lisinopril (PRINIVIL,ZESTRIL) 10 MG tablet, Take 10 mg by mouth daily., Disp: , Rfl:  .  oxybutynin (DITROPAN-XL) 5 MG 24 hr tablet, Take 5 mg by mouth at bedtime., Disp: , Rfl:  .  oxyCODONE-acetaminophen (PERCOCET) 7.5-325 MG tablet, Take 1 tablet by mouth every 6 (six) hours as needed., Disp: 56  tablet, Rfl: 0 .  pantoprazole (PROTONIX) 40 MG tablet, Take 1 tablet (40 mg total) by mouth 2 (two) times daily., Disp: 60 tablet, Rfl: 2 .  rosuvastatin (CRESTOR) 40 MG tablet, Take 40 mg by mouth daily.  , Disp: , Rfl:  .  zolpidem (AMBIEN) 10 MG tablet, Take 1 tablet (10 mg total) by mouth at bedtime as needed for sleep., Disp: 30 tablet, Rfl: 2  Status:  Final result   Visible to patient:  Yes (MyChart) Next appt:  02/21/2017 at 09:00 AM in Gastroenterology Gelene Mink, NP)      Ref Range & Units 10d ago (01/30/17) 56yr ago (12/31/15) 47yr ago (08/23/15) 64yr ago (05/19/15)   Creatinine > or = 20.0 mg/dL 409.8  1.19J  4.78G  9.56O    pH 4.5 - 9.0 8.35       Oxidant <200 mcg/mL NEGATIVE       Amphetamines <500 ng/mL NEGATIVE       Barbiturates <300 ng/mL NEGATIVE       Benzodiazepines <100 ng/mL POSITIVE        Marijuana Metabolite <20 ng/mL NEGATIVE       Cocaine Metabolite <150 ng/mL NEGATIVE       Methadone Metabolite <100 ng/mL NEGATIVE       Opiates <100 ng/mL NEGATIVE       Oxycodone <100 ng/mL POSITIVE  Phencyclidine <25 ng/mL NEGATIVE       See Note:               Appropriate positive findings on drug screening  Refill the following meds and follow-up in 3 months Meds ordered this encounter  Medications  . oxyCODONE-acetaminophen (PERCOCET) 7.5-325 MG tablet    Sig: Take 1 tablet by mouth every 6 (six) hours as needed.    Dispense:  56 tablet    Refill:  0  . cyclobenzaprine (FLEXERIL) 10 MG tablet    Sig: Take 1 tablet (10 mg total) by mouth 3 (three) times daily as needed for muscle spasms.    Dispense:  90 tablet    Refill:  5

## 2017-02-09 NOTE — Patient Instructions (Addendum)
What You Need to Know About Prescription Opioid Pain Medicine Opioids are powerful medicines that are used to treat moderate to severe pain. Opioids should be taken with the supervision of a trained health care provider. They should be taken for the shortest period of time as possible. This is because opioids can be addictive and the longer you take opioids, the greater your risk of addiction (opioid use disorder). What do opioids do? Opioids help reduce or eliminate pain. When used for short periods of time, they can help you:  Sleep better.  Do better in physical or occupational therapy.  Feel better in the first few days after an injury.  Recover from surgery.  What kind of problems can opioids cause? Opioids can cause side effects, such as:  Constipation.  Nausea.  Vomiting.  Drowsiness.  Confusion.  Opioid use disorder.  Breathing difficulties (respiratory depression).  Using opioid pain medicines for longer than 3 days increases your risk of these side effects. Taking opioid pain medicine for a long period of time can affect your ability to do daily tasks. It also puts you at risk for:  Car accidents.  Heart attack.  Overdose, which can sometimes lead to death.  What can increase my risk for developing problems while taking opioids? You may be at an especially high risk for problems while taking opioids if you:  Are over the age of 65.  Are pregnant.  Have kidney or liver disease.  Have certain mental health conditions, such as depression or anxiety.  Have a history of substance use disorder.  Have had an opioid overdose in the past.  How do I stop taking opioids if I have been taking them for a long time? If you have been taking opioid medicine for more than a few weeks, you may need to slowly stop taking them (taper). Tapering your use of opioids can decrease your chances of experiencing withdrawal symptoms, such as:  Abdominal pain and  cramping.  Nausea.  Sweating.  Sleepiness.  Restlessness.  Uncontrollable shaking (tremors).  Cravings for the medicine.  Do not attempt to taper your use of opioids on your own. Talk with your health care provider about how to do this. Your health care provider may prescribe a step-down schedule based on how much medicine you are taking and how long you have been taking it. What are the benefits of stopping the use of opioids? By switching from opioid pain medicine to non-opioid pain management options, you will decrease your risk of accidents and injuries associated with long-term opioid use. You will also be able to:  Monitor your pain more accurately and know when to seek medical care if it is not improving.  Decrease risk to others around you. Having opioids in the home increases the risk for accidental or intentional use or overdose by others.  How can I treat pain without opioids? Pain can be managed with many types of alternative treatments. Ask your health care provider to refer you to one or more specialists who can help you manage pain through:  Physical or occupational therapy.  Counseling (cognitive-behavioral therapy).  Good nutrition.  Biofeedback.  Massage.  Meditation.  Non-opioid medicine.  Following a gentle exercise program.  Where can I get support? If you have been taking opioids for a long time, you may benefit from receiving support for quitting from a local support group or counselor. Ask your health care provider for a referral to these resources in your area. When should I   seek medical care? Seek medical care right away if you are taking opioids and you experience any of the following:  Difficulty breathing.  Breathing that is more shallow or slower than normal.  A very slow heartbeat (pulse).  Severe confusion.  Unconsciousness.  Sleepiness.  Difficulty waking from sleep.  Slurred speech.  Nausea and vomiting.  Cold, clammy  skin.  Blue lips or fingernails.  Limpness.  Abnormally small pupils.  If you think that you or someone else may have taken too much of an opioid medicine, get medical help right away. Do not wait to see if the symptoms go away on their own. Call your local emergency services (911 in the [place] or call the hotline of the National Poison Control Center (800-222-1222 in the [place] Where can I get more information? To learn more about opioid medicines, visit the Centers for Disease Control and Prevention web site Opioid Basics at https://www.cdc.gov/drugoverdos/opioids/index.html. Summary  Opioid medicines can help you manage moderate-to-severe pain for a short period of time.  Taking opioid pain medicine for a long period of time puts you at risk for unintentional accidents, injury, and even death.  If you think that you or someone else may have taken too much of an opioid, get medical help right away. This information is not intended to replace advice given to you by your health care provider. Make sure you discuss any questions you have with your health care provider. Document Released: 09/24/2015 Document Revised: 04/21/2016 Document Reviewed: 04/09/2015 Elsevier Interactive Patient Education  2017 [employer].  

## 2017-02-21 ENCOUNTER — Ambulatory Visit: Payer: PPO | Admitting: Gastroenterology

## 2017-03-01 ENCOUNTER — Other Ambulatory Visit: Payer: Self-pay | Admitting: Orthopedic Surgery

## 2017-03-01 DIAGNOSIS — M1711 Unilateral primary osteoarthritis, right knee: Secondary | ICD-10-CM

## 2017-03-02 ENCOUNTER — Other Ambulatory Visit: Payer: Self-pay | Admitting: Orthopedic Surgery

## 2017-03-02 ENCOUNTER — Other Ambulatory Visit: Payer: Self-pay | Admitting: *Deleted

## 2017-03-02 DIAGNOSIS — M1711 Unilateral primary osteoarthritis, right knee: Secondary | ICD-10-CM

## 2017-03-02 MED ORDER — OXYCODONE-ACETAMINOPHEN 7.5-325 MG PO TABS: 1.0000 | ORAL_TABLET | Freq: Four times a day (QID) | ORAL | 0 refills | Status: DC | PRN

## 2017-03-05 ENCOUNTER — Other Ambulatory Visit (HOSPITAL_COMMUNITY): Payer: Self-pay | Admitting: Psychiatry

## 2017-03-05 DIAGNOSIS — F3130 Bipolar disorder, current episode depressed, mild or moderate severity, unspecified: Secondary | ICD-10-CM

## 2017-03-08 ENCOUNTER — Telehealth (HOSPITAL_COMMUNITY): Payer: Self-pay | Admitting: *Deleted

## 2017-03-08 DIAGNOSIS — F3130 Bipolar disorder, current episode depressed, mild or moderate severity, unspecified: Secondary | ICD-10-CM

## 2017-03-08 MED ORDER — DIVALPROEX SODIUM 250 MG PO DR TAB: DELAYED_RELEASE_TABLET | ORAL | 0 refills | Status: DC

## 2017-03-08 NOTE — Telephone Encounter (Signed)
[facility]

## 2017-03-08 NOTE — Telephone Encounter (Signed)
Patient called, she need refill of her Divalproex SOD DR 250 mg.  [facility]

## 2017-03-13 DIAGNOSIS — Z6837 Body mass index (BMI) 37.0-37.9, adult: Secondary | ICD-10-CM | POA: Diagnosis not present

## 2017-03-13 DIAGNOSIS — Z0000 Encounter for general adult medical examination without abnormal findings: Secondary | ICD-10-CM | POA: Diagnosis not present

## 2017-03-15 ENCOUNTER — Other Ambulatory Visit: Payer: Self-pay | Admitting: Orthopedic Surgery

## 2017-03-15 DIAGNOSIS — M1711 Unilateral primary osteoarthritis, right knee: Secondary | ICD-10-CM

## 2017-03-16 ENCOUNTER — Other Ambulatory Visit: Payer: Self-pay | Admitting: Orthopedic Surgery

## 2017-03-16 DIAGNOSIS — M1711 Unilateral primary osteoarthritis, right knee: Secondary | ICD-10-CM

## 2017-03-16 MED ORDER — OXYCODONE-ACETAMINOPHEN 7.5-325 MG PO TABS: 1.0000 | ORAL_TABLET | Freq: Four times a day (QID) | ORAL | 0 refills | Status: DC | PRN

## 2017-03-23 ENCOUNTER — Encounter: Payer: Self-pay | Admitting: Gastroenterology

## 2017-03-23 DIAGNOSIS — R0683 Snoring: Secondary | ICD-10-CM | POA: Diagnosis not present

## 2017-03-23 DIAGNOSIS — E782 Mixed hyperlipidemia: Secondary | ICD-10-CM | POA: Diagnosis not present

## 2017-03-23 DIAGNOSIS — E063 Autoimmune thyroiditis: Secondary | ICD-10-CM | POA: Diagnosis not present

## 2017-03-23 DIAGNOSIS — I10 Essential (primary) hypertension: Secondary | ICD-10-CM | POA: Diagnosis not present

## 2017-03-23 DIAGNOSIS — Z6838 Body mass index (BMI) 38.0-38.9, adult: Secondary | ICD-10-CM | POA: Diagnosis not present

## 2017-03-23 DIAGNOSIS — G478 Other sleep disorders: Secondary | ICD-10-CM | POA: Diagnosis not present

## 2017-03-23 DIAGNOSIS — R21 Rash and other nonspecific skin eruption: Secondary | ICD-10-CM | POA: Diagnosis not present

## 2017-03-30 ENCOUNTER — Other Ambulatory Visit: Payer: Self-pay | Admitting: Orthopedic Surgery

## 2017-03-30 DIAGNOSIS — M1711 Unilateral primary osteoarthritis, right knee: Secondary | ICD-10-CM

## 2017-04-02 ENCOUNTER — Other Ambulatory Visit: Payer: Self-pay | Admitting: Orthopedic Surgery

## 2017-04-02 DIAGNOSIS — M1711 Unilateral primary osteoarthritis, right knee: Secondary | ICD-10-CM

## 2017-04-02 MED ORDER — OXYCODONE-ACETAMINOPHEN 7.5-325 MG PO TABS: 1.0000 | ORAL_TABLET | Freq: Four times a day (QID) | ORAL | 0 refills | Status: DC | PRN

## 2017-04-04 DIAGNOSIS — G478 Other sleep disorders: Secondary | ICD-10-CM | POA: Diagnosis not present

## 2017-04-04 DIAGNOSIS — R0683 Snoring: Secondary | ICD-10-CM | POA: Diagnosis not present

## 2017-04-04 DIAGNOSIS — Z719 Counseling, unspecified: Secondary | ICD-10-CM | POA: Diagnosis not present

## 2017-04-05 ENCOUNTER — Other Ambulatory Visit (HOSPITAL_COMMUNITY): Payer: Self-pay | Admitting: Psychiatry

## 2017-04-05 ENCOUNTER — Other Ambulatory Visit: Payer: Self-pay | Admitting: Nurse Practitioner

## 2017-04-05 DIAGNOSIS — K219 Gastro-esophageal reflux disease without esophagitis: Secondary | ICD-10-CM

## 2017-04-05 DIAGNOSIS — F5105 Insomnia due to other mental disorder: Secondary | ICD-10-CM

## 2017-04-06 DIAGNOSIS — Z6838 Body mass index (BMI) 38.0-38.9, adult: Secondary | ICD-10-CM | POA: Diagnosis not present

## 2017-04-06 DIAGNOSIS — R21 Rash and other nonspecific skin eruption: Secondary | ICD-10-CM | POA: Diagnosis not present

## 2017-04-08 DIAGNOSIS — G4730 Sleep apnea, unspecified: Secondary | ICD-10-CM | POA: Diagnosis not present

## 2017-04-12 ENCOUNTER — Telehealth (HOSPITAL_COMMUNITY): Payer: Self-pay | Admitting: *Deleted

## 2017-04-12 NOTE — Telephone Encounter (Signed)
Prior authorization for Zolpidem completed with [employer] at [phone] [id number]. Spoke with Shontae who gave approval from 09/09/16-09/10/17. Approval # will be faxed within 24 hours. Called to notify pharmacy.

## 2017-04-12 NOTE — Telephone Encounter (Signed)
[facility] [employer] [facility] [employer] Zolpidem Tartrate coverage from 09/09/16-09/10/17 Referral Number [id number].

## 2017-04-12 NOTE — Telephone Encounter (Signed)
noted 

## 2017-04-14 ENCOUNTER — Other Ambulatory Visit: Payer: Self-pay | Admitting: Orthopedic Surgery

## 2017-04-14 DIAGNOSIS — M1711 Unilateral primary osteoarthritis, right knee: Secondary | ICD-10-CM

## 2017-04-16 MED ORDER — OXYCODONE-ACETAMINOPHEN 7.5-325 MG PO TABS: 1.0000 | ORAL_TABLET | Freq: Four times a day (QID) | ORAL | 0 refills | Status: DC | PRN

## 2017-04-25 ENCOUNTER — Ambulatory Visit (HOSPITAL_COMMUNITY): Payer: PPO | Admitting: Psychiatry

## 2017-04-25 DIAGNOSIS — H43391 Other vitreous opacities, right eye: Secondary | ICD-10-CM | POA: Diagnosis not present

## 2017-04-27 ENCOUNTER — Ambulatory Visit (INDEPENDENT_AMBULATORY_CARE_PROVIDER_SITE_OTHER): Payer: Self-pay | Admitting: Gastroenterology

## 2017-04-27 ENCOUNTER — Encounter: Payer: Self-pay | Admitting: Gastroenterology

## 2017-04-27 VITALS — BP 137/81 | HR 76 | Temp 97.7°F | Ht 63.0 in | Wt 218.0 lb

## 2017-04-27 DIAGNOSIS — K219 Gastro-esophageal reflux disease without esophagitis: Secondary | ICD-10-CM

## 2017-04-27 DIAGNOSIS — K5900 Constipation, unspecified: Secondary | ICD-10-CM

## 2017-04-27 NOTE — Assessment & Plan Note (Signed)
Doing well on Protonix BID. Continue.

## 2017-04-27 NOTE — Patient Instructions (Signed)
For constipation: I would like for you to stop taking the Amitiza. Start taking the samples of Linzess. Take 1 capsule each morning, 30 minutes before breakfast. This can cause loose stool for a few days but should get better. If not, let me know.  We will see you in 3 months!

## 2017-04-27 NOTE — Assessment & Plan Note (Signed)
Straining with Amitiza 24 mcg BID. I don't believe she has tried Linzess. Will start Linzess 290 mcg once each morning, 30 minutes before breakfast. Samples provided. Return in 3 months.

## 2017-04-27 NOTE — Progress Notes (Signed)
Referring Provider: Elfredia Nevins, MD Primary Care Physician:  Elfredia Nevins, MD Primary GI: Dr. Jena Gauss   Chief Complaint  Patient presents with  . Constipation  . Abdominal Pain    mid abd    HPI:   Diane [name] is a 66 y.o. female presenting today with a history of GERD and constipation. Last seen in March 2018. Remote history of adenomas. Last colonoscopy in 2016 and due again in 2021.   Taking Amitiza 24 BID. Soft BMs but have to strain. Has stomach discomfort. Feels improved after having a BM. Protonix 40 mg BID. No dysphagia. Doesn't have a good appetite and not sure why. However, up 9 lbs from last visit here. May only eat just once a day.   Past Medical History:  Diagnosis Date  . Allergic rhinitis   . Anxiety   . Arthritis   . Cataracts, bilateral   . Chronic back pain    Sees pain mamagement clinic  . Depression   . GERD (gastroesophageal reflux disease)   . Headaches, cluster 3 yrs ago   last one  . High cholesterol   . Hypertension   . Hypothyroid   . Immature cataract   . Mania (HCC)   . PTSD (post-traumatic stress disorder)   . Seizures (HCC) 20 yrs ago   unknown etiology-on meds  . Sleep apnea    STOP BANG score=4  . 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Past Surgical History:  Procedure Laterality Date  . BACK SURGERY    . CATARACT EXTRACTION W/PHACO Left 01/04/2016   Procedure: CATARACT EXTRACTION PHACO AND INTRAOCULAR LENS PLACEMENT (IOC);  Surgeon: Jethro Bolus, MD;  Location: [facility];  Service: Ophthalmology;  Laterality: Left;  CDE:4.70  . CATARACT EXTRACTION W/PHACO Right 01/18/2016   Procedure: CATARACT EXTRACTION PHACO AND INTRAOCULAR LENS PLACEMENT RIGHT EYE; CDE:  4.66;  Surgeon: Jethro Bolus, MD;  Location: [facility];  Service: Ophthalmology;  Laterality: Right;  . CHOLECYSTECTOMY    . COLONOSCOPY  10/16/2008   [id number], otherwise normal rectum; pancolonic diverticula the remainder of the colonic mucosa appeared normal. Next TCS due  10/2013.  Marland Kitchen COLONOSCOPY  06/28/2006   RMR: Internal hemorrhoids. Diminutive rectal polyps, cold biopsied/ Pancolonic diverticula. Polyps in the right colon removed   . COLONOSCOPY WITH PROPOFOL N/A 05/20/2015   RMR: Pancolonic diverticulosis. Redundant colon   . ESOPHAGOGASTRODUODENOSCOPY  10/16/2008   BWG:[id number] hiatal hernia otherwise normal esophagus, stomach  D1, D2, status post passage of a 56-French Maloney dilator  . ESOPHAGOGASTRODUODENOSCOPY  06/28/2006   JTT:SVXBLT esophagus. Small hiatal hernia. Otherwise normal stomach, D1 and D2, status post passage of a 56 [place] Maloney dilator  . ESOPHAGOGASTRODUODENOSCOPY (EGD) WITH ESOPHAGEAL DILATION N/A 07/14/2013   RMR: Abnormal esophagus of uncertain significance-status post esophageal biospy. small hiatal hernia.   Marland Kitchen ESOPHAGOGASTRODUODENOSCOPY (EGD) WITH PROPOFOL N/A 05/20/2015   RMR: NORMAL EGD status post maloney dilation   . FOOT SURGERY     left foot-  . INCISION AND DRAINAGE Right 01/03/2013   Procedure: INCISION AND DRAINAGE;  Surgeon: Vickki Hearing, MD;  Location: [facility];  Service: Orthopedics;  Laterality: Right;  . KNEE ARTHROSCOPY  01/2012   right  . KNEE ARTHROSCOPY WITH LATERAL RELEASE Right 01/03/2013   Procedure: Lateral Release Patella Right Knee;  Surgeon: Vickki Hearing, MD;  Location: [facility];  Service: Orthopedics;  Laterality: Right;  . left foot  x 2  . LUMBAR DISC SURGERY  L4-5  . MALONEY DILATION  N/A 05/20/2015   Procedure: Elease Hashimoto DILATION;  Surgeon: Corbin Ade, MD;  Location: [facility];  Service: Endoscopy;  Laterality: N/A;  Maloney dilator # 54  . oophoectomy  bilateral  . PARTIAL HYSTERECTOMY    . TOTAL KNEE ARTHROPLASTY  07/01/2012   Procedure: TOTAL KNEE ARTHROPLASTY;  Surgeon: Vickki Hearing, MD;  Location: [facility];  Service: Orthopedics;  Laterality: Right;  . TOTAL KNEE REVISION Right 01/03/2013   Procedure: Patellaplasty Right Knee;  Surgeon: Vickki Hearing, MD;  Location: [facility];  Service:  Orthopedics;  Laterality: Right;    Current Outpatient Prescriptions  Medication Sig Dispense Refill  . ALPRAZolam (XANAX) 0.5 MG tablet Take one twice a day and 2 at bedtime 120 tablet 2  . AMITIZA 24 MCG capsule TAKE ONE CAPSULE BY MOUTH TWICE DAILY WITH A MEAL. 60 capsule 5  . amLODipine (NORVASC) 5 MG tablet Take 5 mg by mouth daily.    Marland Kitchen atorvastatin (LIPITOR) 20 MG tablet Take 20 mg by mouth daily.  1  . cyclobenzaprine (FLEXERIL) 10 MG tablet Take 1 tablet (10 mg total) by mouth 3 (three) times daily as needed for muscle spasms. 90 tablet 5  . divalproex (DEPAKOTE) 250 MG DR tablet TAKE THREE (3) TABLETS BY MOUTH AT BEDTIME 90 tablet 0  . escitalopram (LEXAPRO) 20 MG tablet Take 1 tablet (20 mg total) by mouth daily. 30 tablet 2  . levothyroxine (SYNTHROID, LEVOTHROID) 150 MCG tablet Take 150 mcg by mouth daily.  5  . lisinopril (PRINIVIL,ZESTRIL) 10 MG tablet Take 10 mg by mouth daily.    Marland Kitchen oxybutynin (DITROPAN-XL) 5 MG 24 hr tablet Take 5 mg by mouth at bedtime.    Marland Kitchen oxyCODONE-acetaminophen (PERCOCET) 7.5-325 MG tablet Take 1 tablet by mouth every 6 (six) hours as needed. 56 tablet 0  . pantoprazole (PROTONIX) 40 MG tablet TAKE ONE TABLET BY MOUTH TWICE DAILY. 60 tablet 3  . zolpidem (AMBIEN) 10 MG tablet Take 1 tablet (10 mg total) by mouth at bedtime as needed for sleep. 30 tablet 2  . levothyroxine (SYNTHROID, LEVOTHROID) 175 MCG tablet Take 175 mcg by mouth daily.  1  . rosuvastatin (CRESTOR) 40 MG tablet Take 40 mg by mouth daily.       No current facility-administered medications for this visit.     Allergies as of 04/27/2017 - Review Complete 04/27/2017  Allergen Reaction Noted  . Neomycin-bacitracin zn-polymyx Other (See Comments) 02/27/2008  . Penicillins Hives   . Sulfonamide derivatives Hives and Swelling 02/27/2008    Family History  Problem Relation Age of Onset  . Alcohol abuse Mother   . Anxiety disorder Mother   . Alcohol abuse Father   . Anxiety disorder  Father   . Bipolar disorder Sister   . Alcohol abuse [name]   . Anxiety disorder [name]   . Drug abuse [name]   . Dementia Paternal Aunt   . ADD / ADHD Grandchild   . ADD / ADHD Grandchild   . Drug abuse [name]   . Alcohol abuse [name]   . Anxiety disorder [name]   . Seizures [name]   . Alcohol abuse [name]   . Anxiety disorder [name]   . Seizures [name]   . Alcohol abuse [name]   . Anxiety disorder [name]   . Alcohol abuse [name]   . Anxiety disorder [name]   . Heart disease Unknown   . Diabetes Unknown   . Alcohol abuse Unknown   . Depression Neg Hx   .  OCD Neg Hx   . Paranoid behavior Neg Hx   . Schizophrenia Neg Hx   . Sexual abuse Neg Hx   . Physical abuse Neg Hx   . Colon cancer Neg Hx     Social History   Social History  . Marital status: Married    Spouse name: N/A  . Number of children: N/A  . Years of education: 12th grade   Occupational History  . disabled Unemployed   Social History Main Topics  . Smoking status: Former Smoker    Packs/day: 0.25    Years: 5.00    Types: Cigarettes    Quit date: 06/26/2003  . Smokeless tobacco: Never Used  . Alcohol use 0.0 oz/week     Comment: occasionally; once a month.  . Drug use: No     Comment: has a past history of street drug use  . Sexual activity: No   Other Topics Concern  . None   Social History Narrative  . None    Review of Systems: As mentioned in HPI   Physical Exam: BP 137/81   Pulse 76   Temp 97.7 F (36.5 C) (Oral)   Ht 5\' 3"  (1.6 m)   Wt 218 lb (98.9 kg)   BMI 38.62 kg/m  General:   Alert and oriented. No distress noted. Pleasant and cooperative.  Head:  Normocephalic and atraumatic. Eyes:  Conjuctiva clear without scleral icterus. Mouth:  Oral mucosa pink and moist. Good dentition. No lesions. Abdomen:  +BS, soft, mild discomfort upper abdomen, query ventral hernia, and non-distended. No rebound or guarding. No HSM or masses noted. Msk:  Symmetrical without  gross deformities. Normal posture. Extremities:  Without edema. Neurologic:  Alert and  oriented x4 Psych:  Alert and cooperative. Normal mood and affect.

## 2017-04-28 ENCOUNTER — Telehealth: Payer: Self-pay | Admitting: Orthopedic Surgery

## 2017-04-28 DIAGNOSIS — M1711 Unilateral primary osteoarthritis, right knee: Secondary | ICD-10-CM

## 2017-04-30 NOTE — Progress Notes (Signed)
CC'ED TO PCP 

## 2017-05-01 ENCOUNTER — Ambulatory Visit (INDEPENDENT_AMBULATORY_CARE_PROVIDER_SITE_OTHER): Payer: Medicare HMO | Admitting: Psychiatry

## 2017-05-01 ENCOUNTER — Encounter (HOSPITAL_COMMUNITY): Payer: Self-pay | Admitting: Psychiatry

## 2017-05-01 VITALS — BP 127/99 | HR 70 | Ht 63.0 in | Wt 218.2 lb

## 2017-05-01 DIAGNOSIS — F5105 Insomnia due to other mental disorder: Secondary | ICD-10-CM | POA: Diagnosis not present

## 2017-05-01 DIAGNOSIS — Z813 Family history of other psychoactive substance abuse and dependence: Secondary | ICD-10-CM

## 2017-05-01 DIAGNOSIS — Z811 Family history of alcohol abuse and dependence: Secondary | ICD-10-CM

## 2017-05-01 DIAGNOSIS — F3130 Bipolar disorder, current episode depressed, mild or moderate severity, unspecified: Secondary | ICD-10-CM | POA: Diagnosis not present

## 2017-05-01 DIAGNOSIS — F1010 Alcohol abuse, uncomplicated: Secondary | ICD-10-CM | POA: Diagnosis not present

## 2017-05-01 DIAGNOSIS — Z818 Family history of other mental and behavioral disorders: Secondary | ICD-10-CM

## 2017-05-01 DIAGNOSIS — R69 Illness, unspecified: Secondary | ICD-10-CM | POA: Diagnosis not present

## 2017-05-01 DIAGNOSIS — Z736 Limitation of activities due to disability: Secondary | ICD-10-CM

## 2017-05-01 DIAGNOSIS — Z810 Family history of intellectual disabilities: Secondary | ICD-10-CM | POA: Diagnosis not present

## 2017-05-01 DIAGNOSIS — F1310 Sedative, hypnotic or anxiolytic abuse, uncomplicated: Secondary | ICD-10-CM

## 2017-05-01 MED ORDER — ESCITALOPRAM OXALATE 20 MG PO TABS: 20.0000 mg | ORAL_TABLET | Freq: Every day | ORAL | 2 refills | Status: DC

## 2017-05-01 MED ORDER — ZOLPIDEM TARTRATE 10 MG PO TABS: 10.0000 mg | ORAL_TABLET | Freq: Every evening | ORAL | 2 refills | Status: DC | PRN

## 2017-05-01 MED ORDER — ALPRAZOLAM 0.5 MG PO TABS: ORAL_TABLET | ORAL | 2 refills | Status: DC

## 2017-05-01 MED ORDER — DIVALPROEX SODIUM 250 MG PO DR TAB: DELAYED_RELEASE_TABLET | ORAL | 0 refills | Status: DC

## 2017-05-01 MED ORDER — OXYCODONE-ACETAMINOPHEN 7.5-325 MG PO TABS: 1.0000 | ORAL_TABLET | Freq: Four times a day (QID) | ORAL | 0 refills | Status: DC | PRN

## 2017-05-01 NOTE — Telephone Encounter (Signed)
yes

## 2017-05-01 NOTE — Progress Notes (Signed)
Patient ID: Diane [name], female   DOB: [birth year], 66 y.o.   MRN: [id number] Patient ID: Diane [name], female   DOB: [birth year], 66 y.o.   MRN: [id number] Patient ID: Diane [name], female   DOB: 1951-03-01, 66 y.o.   MRN: [id number] Patient ID: Diane [name], female   DOB: 1951/01/20, 67 y.o.   MRN: [id number] Patient ID: Diane [name], female   DOB: July 23, 1951, 66 y.o.   MRN: [id number] Patient ID: Diane [name], female   DOB: 04/14/1951, 66 y.o.   MRN: [id number] Patient ID: Diane [name], female   DOB: 07/12/1951, 66 y.o.   MRN: [id number] Patient ID: Diane [name], female   DOB: 15-Jul-1951, 66 y.o.   MRN: [id number] Patient ID: Diane [name], female   DOB: 03/13/1951, 66 y.o.   MRN: [id number] Patient ID: Diane [name], female   DOB: Jan 29, 1951, 66 y.o.   MRN: [id number] Patient ID: Diane [name], female   DOB: Mar 13, 1951, 66 y.o.   MRN: [id number] [facility] [id number] Progress Note Diane [name] MRN: [id number] DOB: 1950/12/06 Age: 66 y.o.  Date: 05/01/2017  Chief Complaint  Patient presents with  . Depression  . Anxiety  . Follow-up    History of present illness Patient is 66 year old [place] American female who came for her followup appointment. She is currently with her Boyfriend in [place] and is on disability  The patient was last seen in March. At that time she told me she was moving to [place] to be with her daughter. She didn't like living down there and she and her grandson have come back. Her grandson got his own place to live and she is now staying in a motel temporarily. Her mood is "up and down" because she is unsure where she is going to live. She's in a lot of pain in her knee and would like to get back on the amitriptyline since she ran out. A nurse practitioner and primary care put her on Celexa without realizing that she's already on Cymbalta. I explained to her that made no sense to be on both. She does admit that she still drinks periodically sometimes  too much on weekends. I warned her against this. She denies being severely depressed or suicidal  The patient returns after 3 months. For the most part she is doing okay. She states that her boyfriend is living with her and they're getting along fairly well. He works at an [employer] and he works 7 days a week most of the time. She is lonely and ends up sleeping a lot to the day. She still sleeping fairly well at night. She is cut her drinking down to 1-3 beers in the evening with her boyfriend. One problem she has is that they only have one car and he needs it for work. She's pretty much stranded at the house. She is hoping at some point he will get a job with less hours and they'll be able to afford a second vehicle. Her mood is a little up and down but generally not too depressed and her anxiety is well controlled on Xanax. She states that she rarely uses the Percocet   Past psychiatric history Patient has history of one psychiatric admission in [facility]. She took overdose on her pills she was also intoxicated with alcohol. Patient has also seen by psychiatrist in 2004. At that time she was seeking more benzodiazepines and pain medication.  Vitals: BP (!) 127/99 (BP Location: Right Arm, Patient Position: Sitting, Cuff Size: Large) Comment: per pt did not BP med today  Pulse 70   Ht 5\' 3"  (1.6 m)   Wt 218 lb 3.2 oz (99 kg)   BMI 38.65 kg/m   Allergies:  Medical History: Past Medical History:  Diagnosis Date  . Allergic rhinitis   . Anxiety   . Arthritis   . Cataracts, bilateral   . Chronic back pain    Sees pain mamagement clinic  . Depression   . GERD (gastroesophageal reflux disease)   . Headaches, cluster 3 yrs ago   last one  . High cholesterol   . Hypertension   . Hypothyroid   . Immature cataract   . Mania (HCC)   . PTSD (post-traumatic stress disorder)   . Seizures (HCC) 20 yrs ago   unknown etiology-on meds  . Sleep  apnea    STOP BANG score=4  . 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Patient has history of hypertension, allergic rhinitis, hypothyroidism, chronic back pain, hyperlipidemia and obesity.  She most recently had surgery on her right knee.  Surgical History: Past Surgical History:  Procedure Laterality Date  . BACK SURGERY    . CATARACT EXTRACTION W/PHACO Left 01/04/2016   Procedure: CATARACT EXTRACTION PHACO AND INTRAOCULAR LENS PLACEMENT (IOC);  Surgeon: Jethro Bolus, MD;  Location: [facility];  Service: Ophthalmology;  Laterality: Left;  CDE:4.70  . CATARACT EXTRACTION W/PHACO Right 01/18/2016   Procedure: CATARACT EXTRACTION PHACO AND INTRAOCULAR LENS PLACEMENT RIGHT EYE; CDE:  4.66;  Surgeon: Jethro Bolus, MD;  Location: [facility];  Service: Ophthalmology;  Laterality: Right;  . CHOLECYSTECTOMY    . COLONOSCOPY  10/16/2008   [id number], otherwise normal rectum; pancolonic diverticula the remainder of the colonic mucosa appeared normal. Next TCS due 10/2013.  Marland Kitchen COLONOSCOPY  06/28/2006   RMR: Internal hemorrhoids. Diminutive rectal polyps, cold biopsied/ Pancolonic diverticula. Polyps in the right colon removed   . COLONOSCOPY WITH PROPOFOL N/A 05/20/2015   RMR: Pancolonic diverticulosis. Redundant colon   . ESOPHAGOGASTRODUODENOSCOPY  10/16/2008   XBJ:YNWGN hiatal hernia otherwise normal esophagus, stomach  D1, D2, status post passage of a 56-French Maloney dilator  . ESOPHAGOGASTRODUODENOSCOPY  06/28/2006   FAO:ZHYQMV esophagus. Small hiatal hernia. Otherwise normal stomach, D1 and D2, status post passage of a 56 [place] Maloney dilator  . ESOPHAGOGASTRODUODENOSCOPY (EGD) WITH ESOPHAGEAL DILATION N/A 07/14/2013   RMR: Abnormal esophagus of uncertain significance-status post esophageal biospy. small hiatal hernia.   Marland Kitchen ESOPHAGOGASTRODUODENOSCOPY (EGD) WITH PROPOFOL N/A 05/20/2015   RMR: NORMAL EGD status post maloney dilation   . FOOT SURGERY     left foot-  . INCISION AND DRAINAGE Right 01/03/2013    Procedure: INCISION AND DRAINAGE;  Surgeon: Vickki Hearing, MD;  Location: [facility];  Service: Orthopedics;  Laterality: Right;  . KNEE ARTHROSCOPY  01/2012   right  . KNEE ARTHROSCOPY WITH LATERAL RELEASE Right 01/03/2013   Procedure: Lateral Release Patella Right Knee;  Surgeon: Vickki Hearing, MD;  Location: [facility];  Service: Orthopedics;  Laterality: Right;  . left foot  x 2  . LUMBAR DISC SURGERY  L4-5  . MALONEY DILATION N/A 05/20/2015   Procedure: Elease Hashimoto DILATION;  Surgeon: Corbin Ade, MD;  Location: [facility];  Service: Endoscopy;  Laterality: N/A;  Maloney dilator # 54  . oophoectomy  bilateral  . PARTIAL HYSTERECTOMY    . TOTAL KNEE ARTHROPLASTY  07/01/2012   Procedure: TOTAL KNEE ARTHROPLASTY;  Surgeon:  Vickki Hearing, MD;  Location: [facility];  Service: Orthopedics;  Laterality: Right;  . TOTAL KNEE REVISION Right 01/03/2013   Procedure: Patellaplasty Right Knee;  Surgeon: Vickki Hearing, MD;  Location: [facility];  Service: Orthopedics;  Laterality: Right;   Family History: family history includes ADD / ADHD in her grandchild and grandchild; Alcohol abuse in her brother, brother, brother, brother, brother, father, mother, and unknown relative; Anxiety disorder in her brother, brother, brother, brother, brother, father, and mother; Bipolar disorder in her sister; Dementia in her paternal aunt; Diabetes in her unknown relative; Drug abuse in her brother and brother; Heart disease in her unknown relative; Seizures in her brother and brother.   Psychosocial history Patient has been married for 16 years. She has 2 children. She is married however her relationship is very tense. She has history of leaving her husband in the past.  Currently she lives by herself.  Mental status examination Patient is mildly obese female who is casually dressed and fairly groomed.  .  She is cooperative and maintained fair eye contact her mood is Fairly good and Her affect is brighter She denies  suicidal ideation . Her speech is clear and coherent. Her attention and concentration is fair. . She's alert and oriented x3. There are no psychotic symptoms present at this time. She denies any auditory or visual hallucination. Her insight judgment is fair and her impulse control is okay.  Lab Results:  Results for orders placed or performed in visit on 01/30/17 (from the past 8736 hour(s))  Pain Mgmt, Profile 1 w/o Conf, U   Collection Time: 01/30/17 10:34 AM  Result Value Ref Range   Creatinine 196.9 > or = 20.0 mg/dL   pH 8.29 4.5 - 9.0   Oxidant NEGATIVE <200 mcg/mL   Amphetamines NEGATIVE <500 ng/mL   Barbiturates NEGATIVE <300 ng/mL   Benzodiazepines POSITIVE (A) <100 ng/mL   Marijuana Metabolite NEGATIVE <20 ng/mL   Cocaine Metabolite NEGATIVE <150 ng/mL   Methadone Metabolite NEGATIVE <100 ng/mL   Opiates NEGATIVE <100 ng/mL   Oxycodone POSITIVE (A) <100 ng/mL   Phencyclidine NEGATIVE <25 ng/mL   See Note:     Results for orders placed or performed in visit on 01/25/17 (from the past 8736 hour(s))  Valproic Acid level   Collection Time: 01/30/17 10:31 AM  Result Value Ref Range   Valproic Acid Lvl 71.5 50.0 - 100.0 mg/L  Hepatic function panel   Collection Time: 01/30/17 10:31 AM  Result Value Ref Range   Total Bilirubin 0.4 0.2 - 1.2 mg/dL   Bilirubin, Direct 0.1 <=0.2 mg/dL   Indirect Bilirubin 0.3 0.2 - 1.2 mg/dL   Alkaline Phosphatase 63 33 - 130 U/L   AST 12 10 - 35 U/L   ALT 12 6 - 29 U/L   Total Protein 6.6 6.1 - 8.1 g/dL   Albumin 3.4 (L) 3.6 - 5.1 g/dL   Assessment Axis I bipolar disorder NOS, benzodiazepine abuse, alcohol abuse Axis II deferred Axis III see medical history Axis IV moderate Axis V 60-65  Plan/Discussion: She'll Continue Ambien 10 mg at bedtime for sleep,, continue Xanax for anxiety  0.5 mg in the morning and 1 mg at bedtime and adding other 0.5 mg in the afternoon and Depakote for mood disorder. She will continue Lexapro 20 mg daily for  depression.Last Depakote and liver function tests were within normal limits She'll return to see me in 3 months. We have tried to schedule her for counseling here  but she is not consistent in follow-up.  Time spent 15 minutes.  More than 50% of the time spent in psychoeducation, counseling and coordination of care.  Discuss safety plan that anytime having active suicidal thoughts or homicidal thoughts then patient need to call 911 or go to the local emergency room.  MEDICATIONS this encounter: Meds ordered this encounter  Medications  . linaclotide (LINZESS) 290 MCG CAPS capsule    Sig: Take 290 mcg by mouth daily before breakfast.  . escitalopram (LEXAPRO) 20 MG tablet    Sig: Take 1 tablet (20 mg total) by mouth daily.    Dispense:  30 tablet    Refill:  2  . divalproex (DEPAKOTE) 250 MG DR tablet    Sig: TAKE THREE (3) TABLETS BY MOUTH AT BEDTIME    Dispense:  90 tablet    Refill:  0  . zolpidem (AMBIEN) 10 MG tablet    Sig: Take 1 tablet (10 mg total) by mouth at bedtime as needed for sleep.    Dispense:  30 tablet    Refill:  2  . ALPRAZolam (XANAX) 0.5 MG tablet    Sig: Take one twice a day and 2 at bedtime    Dispense:  120 tablet    Refill:  2    Medical Decision Making Problem Points:  Established problem, stable/improving (1), Established problem, worsening (2), New problem, with no additional work-up planned (3), Review of last therapy session (1) and Review of psycho-social stressors (1) Data Points:  Review or order clinical lab tests (1) Review and summation of old records (2) Review of medication regiment & side effects (2) Review of new medications or change in dosage (2)  ROSS, DEBORAH, MDPatient ID: [name], female   DOB: [birth year], 66 y.o.   MRN: [id number]

## 2017-05-08 ENCOUNTER — Telehealth: Payer: Self-pay | Admitting: Internal Medicine

## 2017-05-08 NOTE — Telephone Encounter (Signed)
Let's have her take it with breakfast instead of before breakfast. If this does not work, then try Miralax in addition to it each morning.

## 2017-05-08 NOTE — Telephone Encounter (Signed)
Patient called and stated that the linzess did not help her have a bowel movement, still having to strain to have one.  [phone] is there anything else she can try

## 2017-05-08 NOTE — Telephone Encounter (Signed)
Routing to AB.  

## 2017-05-09 NOTE — Telephone Encounter (Signed)
Tried to call pt- NA-LMOM with recommendations. I have also sent her a message in [facility].

## 2017-05-10 ENCOUNTER — Telehealth: Payer: Self-pay | Admitting: Orthopaedic Surgery

## 2017-05-10 ENCOUNTER — Telehealth: Payer: Self-pay | Admitting: Internal Medicine

## 2017-05-10 DIAGNOSIS — M1711 Unilateral primary osteoarthritis, right knee: Secondary | ICD-10-CM

## 2017-05-10 MED ORDER — LINACLOTIDE 290 MCG PO CAPS: 290.0000 ug | ORAL_CAPSULE | Freq: Every day | ORAL | 3 refills | Status: DC

## 2017-05-10 NOTE — Telephone Encounter (Signed)
Routing to the refill box. 

## 2017-05-10 NOTE — Telephone Encounter (Signed)
Done

## 2017-05-10 NOTE — Addendum Note (Signed)
Addended by: Gelene MinkBOONE, Joley Utecht W on: 05/10/2017 11:20 AM   Modules accepted: Orders

## 2017-05-10 NOTE — Telephone Encounter (Signed)
Pt called to say that she is out of Linzess samples and needs a prescription called into [name] in [place]

## 2017-05-11 ENCOUNTER — Telehealth: Payer: Self-pay | Admitting: Internal Medicine

## 2017-05-11 NOTE — Telephone Encounter (Signed)
I called [facility] and spoke to [place] and she will remove the order for Linzess.  I called [name] and gave the order for the Linzess 290 mcg #90 to take one capsule daily before breakfast and 3 refills from Lewie LoronAnna Boone, NP.   I have put in [name] as pt's pharmacy now and removed [facility] from pharmacy list.

## 2017-05-11 NOTE — Telephone Encounter (Signed)
Patient linzess prescription needs to go to [name] in [place]. [facility]

## 2017-05-15 MED ORDER — OXYCODONE-ACETAMINOPHEN 7.5-325 MG PO TABS: 1.0000 | ORAL_TABLET | Freq: Four times a day (QID) | ORAL | 0 refills | Status: DC | PRN

## 2017-05-21 ENCOUNTER — Ambulatory Visit (INDEPENDENT_AMBULATORY_CARE_PROVIDER_SITE_OTHER): Payer: Medicare HMO | Admitting: Orthopedic Surgery

## 2017-05-21 DIAGNOSIS — M1711 Unilateral primary osteoarthritis, right knee: Secondary | ICD-10-CM | POA: Diagnosis not present

## 2017-05-21 DIAGNOSIS — R52 Pain, unspecified: Secondary | ICD-10-CM

## 2017-05-21 MED ORDER — OXYCODONE-ACETAMINOPHEN 7.5-325 MG PO TABS: 1.0000 | ORAL_TABLET | Freq: Four times a day (QID) | ORAL | 0 refills | Status: DC | PRN

## 2017-05-21 NOTE — Progress Notes (Signed)
Follow-up visit chronic pain management  Patient had right total knee with revision chronic pain lower back pain currently managed with opioid, oxycodone 7.5 mg which is working well  She would like her left knee checked under review of systems left knee pain, no chest pain or shortness of breath  Past Medical History:  Diagnosis Date  . Allergic rhinitis   . Anxiety   . Arthritis   . Cataracts, bilateral   . Chronic back pain    Sees pain mamagement clinic  . Depression   . GERD (gastroesophageal reflux disease)   . Headaches, cluster 3 yrs ago   last one  . High cholesterol   . Hypertension   . Hypothyroid   . Immature cataract   . Mania (HCC)   . PTSD (post-traumatic stress disorder)   . Seizures (HCC) 20 yrs ago   unknown etiology-on meds  . Sleep apnea    STOP BANG score=4  . 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She does seem a bit groggy today but she says she is sad because one of her friends died her appearance is normal is oriented 3 her gait is labored  New prescription  Urine drug screen ordered  Follow up for evaluation left knee new problem  Encounter Diagnoses  Name Primary?  . Pain management Yes  . Arthritis of knee, right     Meds ordered this encounter  Medications  . oxyCODONE-acetaminophen (PERCOCET) 7.5-325 MG tablet    Sig: Take 1 tablet by mouth every 6 (six) hours as needed.    Dispense:  56 tablet    Refill:  0

## 2017-05-28 DIAGNOSIS — R52 Pain, unspecified: Secondary | ICD-10-CM | POA: Diagnosis not present

## 2017-05-28 DIAGNOSIS — Z5181 Encounter for therapeutic drug level monitoring: Secondary | ICD-10-CM | POA: Diagnosis not present

## 2017-05-29 LAB — PAIN MGMT, PROFILE 1 W/O CONF, U
Amphetamines: NEGATIVE ng/mL (ref <500)
Barbiturates: NEGATIVE ng/mL (ref <300)
Benzodiazepines: POSITIVE ng/mL — AB (ref <100)
Cocaine Metabolite: NEGATIVE ng/mL (ref <150)
Creatinine: 54.5 mg/dL
Marijuana Metabolite: NEGATIVE ng/mL (ref <20)
Methadone Metabolite: NEGATIVE ng/mL (ref <100)
Opiates: NEGATIVE ng/mL (ref <100)
Oxidant: NEGATIVE ug/mL (ref <200)
Oxycodone: POSITIVE ng/mL — AB (ref <100)
Phencyclidine: NEGATIVE ng/mL (ref <25)
pH: 6.9 (ref 4.5–9.0)

## 2017-05-30 DIAGNOSIS — G4733 Obstructive sleep apnea (adult) (pediatric): Secondary | ICD-10-CM | POA: Diagnosis not present

## 2017-05-30 DIAGNOSIS — Z96659 Presence of unspecified artificial knee joint: Secondary | ICD-10-CM | POA: Diagnosis not present

## 2017-06-04 ENCOUNTER — Encounter: Payer: Self-pay | Admitting: Orthopedic Surgery

## 2017-06-04 ENCOUNTER — Ambulatory Visit (INDEPENDENT_AMBULATORY_CARE_PROVIDER_SITE_OTHER): Payer: Medicare HMO | Admitting: Orthopedic Surgery

## 2017-06-04 ENCOUNTER — Ambulatory Visit (INDEPENDENT_AMBULATORY_CARE_PROVIDER_SITE_OTHER): Payer: Medicare HMO

## 2017-06-04 VITALS — BP 159/81 | HR 72 | Ht 63.0 in | Wt 218.0 lb

## 2017-06-04 DIAGNOSIS — M25562 Pain in left knee: Secondary | ICD-10-CM

## 2017-06-04 DIAGNOSIS — S8002XA Contusion of left knee, initial encounter: Secondary | ICD-10-CM

## 2017-06-04 MED ORDER — MELOXICAM 7.5 MG PO TABS: 7.5000 mg | ORAL_TABLET | Freq: Every day | ORAL | 1 refills | Status: DC

## 2017-06-04 NOTE — Patient Instructions (Signed)
Ice  Take new medication for 2 weeks

## 2017-06-04 NOTE — Progress Notes (Signed)
Patient ID: JOSELLE DEEDS, female   DOB: [birth year], 66 y.o.   MRN: [id number]  Chief Complaint  Patient presents with  . Knee Pain    left side    HPI Rashana CONSEPCION UTT is a 66 y.o. female.  Presents for evaluation of left knee pain after fall 3 weeks ago  Patient complains of anterior tibial pain dull mild constant 3 weeks duration secondary to a fall  Review of Systems Review of Systems  Musculoskeletal: Positive for back pain and gait problem.  Neurological: Negative for numbness.   (2 MINIMUM)  Past Medical History:  Diagnosis Date  . Allergic rhinitis   . Anxiety   . Arthritis   . Cataracts, bilateral   . Chronic back pain    Sees pain mamagement clinic  . Depression   . GERD (gastroesophageal reflux disease)   . Headaches, cluster 3 yrs ago   last one  . High cholesterol   . Hypertension   . Hypothyroid   . Immature cataract   . Mania (HCC)   . PTSD (post-traumatic stress disorder)   . Seizures (HCC) 20 yrs ago   unknown etiology-on meds  . Sleep apnea    STOP BANG score=4  . 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Past Surgical History:  Procedure Laterality Date  . BACK SURGERY    . CATARACT EXTRACTION W/PHACO Left 01/04/2016   Procedure: CATARACT EXTRACTION PHACO AND INTRAOCULAR LENS PLACEMENT (IOC);  Surgeon: Jethro Bolus, MD;  Location: [facility];  Service: Ophthalmology;  Laterality: Left;  CDE:4.70  . CATARACT EXTRACTION W/PHACO Right 01/18/2016   Procedure: CATARACT EXTRACTION PHACO AND INTRAOCULAR LENS PLACEMENT RIGHT EYE; CDE:  4.66;  Surgeon: Jethro Bolus, MD;  Location: [facility];  Service: Ophthalmology;  Laterality: Right;  . CHOLECYSTECTOMY    . COLONOSCOPY  10/16/2008   NWG:[id number], otherwise normal rectum; pancolonic diverticula the remainder of the colonic mucosa appeared normal. Next TCS due 10/2013.  Marland Kitchen COLONOSCOPY  06/28/2006   RMR: Internal hemorrhoids. Diminutive rectal polyps, cold biopsied/ Pancolonic diverticula. Polyps in the right colon removed    . COLONOSCOPY WITH PROPOFOL N/A 05/20/2015   RMR: Pancolonic diverticulosis. Redundant colon   . ESOPHAGOGASTRODUODENOSCOPY  10/16/2008   QIO:NGEXB hiatal hernia otherwise normal esophagus, stomach  D1, D2, status post passage of a 56-French Maloney dilator  . ESOPHAGOGASTRODUODENOSCOPY  06/28/2006   MWU:[id number] esophagus. Small hiatal hernia. Otherwise normal stomach, D1 and D2, status post passage of a 56 [place] Maloney dilator  . ESOPHAGOGASTRODUODENOSCOPY (EGD) WITH ESOPHAGEAL DILATION N/A 07/14/2013   RMR: Abnormal esophagus of uncertain significance-status post esophageal biospy. small hiatal hernia.   Marland Kitchen ESOPHAGOGASTRODUODENOSCOPY (EGD) WITH PROPOFOL N/A 05/20/2015   RMR: NORMAL EGD status post maloney dilation   . FOOT SURGERY     left foot-  . INCISION AND DRAINAGE Right 01/03/2013   Procedure: INCISION AND DRAINAGE;  Surgeon: Vickki Hearing, MD;  Location: [facility];  Service: Orthopedics;  Laterality: Right;  . KNEE ARTHROSCOPY  01/2012   right  . KNEE ARTHROSCOPY WITH LATERAL RELEASE Right 01/03/2013   Procedure: Lateral Release Patella Right Knee;  Surgeon: Vickki Hearing, MD;  Location: [facility];  Service: Orthopedics;  Laterality: Right;  . left foot  x 2  . LUMBAR DISC SURGERY  L4-5  . MALONEY DILATION N/A 05/20/2015   Procedure: Elease Hashimoto DILATION;  Surgeon: Corbin Ade, MD;  Location: [facility];  Service: Endoscopy;  Laterality: N/A;  Maloney dilator # 54  . oophoectomy  bilateral  . PARTIAL HYSTERECTOMY    . TOTAL KNEE ARTHROPLASTY  07/01/2012   Procedure: TOTAL KNEE ARTHROPLASTY;  Surgeon: Vickki Hearing, MD;  Location: [facility];  Service: Orthopedics;  Laterality: Right;  . TOTAL KNEE REVISION Right 01/03/2013   Procedure: Patellaplasty Right Knee;  Surgeon: Vickki Hearing, MD;  Location: [facility];  Service: Orthopedics;  Laterality: Right;    Social History Social History  Substance Use Topics  . Smoking status: Former Smoker    Packs/day: 0.25    Years: 5.00     Types: Cigarettes    Quit date: 06/26/2003  . Smokeless tobacco: Never Used  . Alcohol use 0.0 oz/week     Comment: occasionally; once a month.     Current Meds  Medication Sig  . ALPRAZolam (XANAX) 0.5 MG tablet Take one twice a day and 2 at bedtime  . AMITIZA 24 MCG capsule TAKE ONE CAPSULE BY MOUTH TWICE DAILY WITH A MEAL.  Marland Kitchen amLODipine (NORVASC) 5 MG tablet Take 5 mg by mouth daily.  Marland Kitchen atorvastatin (LIPITOR) 20 MG tablet Take 20 mg by mouth daily.  . cyclobenzaprine (FLEXERIL) 10 MG tablet Take 1 tablet (10 mg total) by mouth 3 (three) times daily as needed for muscle spasms.  . divalproex (DEPAKOTE) 250 MG DR tablet TAKE THREE (3) TABLETS BY MOUTH AT BEDTIME  . escitalopram (LEXAPRO) 20 MG tablet Take 1 tablet (20 mg total) by mouth daily.  Marland Kitchen levothyroxine (SYNTHROID, LEVOTHROID) 150 MCG tablet Take 150 mcg by mouth daily.  Marland Kitchen linaclotide (LINZESS) 290 MCG CAPS capsule Take 1 capsule (290 mcg total) by mouth daily before breakfast.  . lisinopril (PRINIVIL,ZESTRIL) 10 MG tablet Take 10 mg by mouth daily.  Marland Kitchen oxybutynin (DITROPAN-XL) 5 MG 24 hr tablet Take 5 mg by mouth at bedtime.  Marland Kitchen oxyCODONE-acetaminophen (PERCOCET) 7.5-325 MG tablet Take 1 tablet by mouth every 6 (six) hours as needed.  . pantoprazole (PROTONIX) 40 MG tablet TAKE ONE TABLET BY MOUTH TWICE DAILY.  Marland Kitchen zolpidem (AMBIEN) 10 MG tablet Take 1 tablet (10 mg total) by mouth at bedtime as needed for sleep.      Physical Exam Physical Exam 1.BP (!) 159/81   Pulse 72   Ht  (1.6 m)   Wt 218 lb (98.9 kg)   BMI 38.62 kg/m   2. Gen. appearance. The patient is well-developed and well-nourished, grooming and hygiene are normal. There are no gross congenital abnormalities  3. The patient is alert and oriented to person place and time  4. Mood and affect are normal  5. Ambulation long-term support with a cane  Examination reveals the following: 6. On inspection we find left knee with small effusion flexion 90  full extension ligament stable McMurray's negative muscle strength normal skin intact no rash sensation normal and pulses normal   Right knee chronic pain Well-healed anterior incision from knee replacement No acute swelling Full extension lacks full flexion no instability  MEDICAL DECISION MAKING:    Data Reviewed X-ray today shows medial compartment arthrosis small effusion left knee  Assessment Encounter Diagnoses  Name Primary?  . Acute pain of left knee Yes  . Contusion of left knee, initial encounter     Plan Meds ordered this encounter  Medications  . meloxicam (MOBIC) 7.5 MG tablet    Sig: Take 1 tablet (7.5 mg total) by mouth daily.    Dispense:  30 tablet    Refill:  1   Recommend ice and above medication for  2 weeks  Fuller Canada 06/04/2017, 11:52 AM

## 2017-06-05 ENCOUNTER — Other Ambulatory Visit (HOSPITAL_COMMUNITY): Payer: Self-pay | Admitting: Psychiatry

## 2017-06-05 DIAGNOSIS — F3130 Bipolar disorder, current episode depressed, mild or moderate severity, unspecified: Secondary | ICD-10-CM

## 2017-06-11 ENCOUNTER — Other Ambulatory Visit: Payer: Self-pay | Admitting: Orthopedic Surgery

## 2017-06-11 DIAGNOSIS — M1711 Unilateral primary osteoarthritis, right knee: Secondary | ICD-10-CM

## 2017-06-11 MED ORDER — OXYCODONE-ACETAMINOPHEN 7.5-325 MG PO TABS: 1.0000 | ORAL_TABLET | Freq: Four times a day (QID) | ORAL | 0 refills | Status: DC | PRN

## 2017-06-27 ENCOUNTER — Other Ambulatory Visit: Payer: Self-pay | Admitting: Orthopedic Surgery

## 2017-06-27 DIAGNOSIS — M1711 Unilateral primary osteoarthritis, right knee: Secondary | ICD-10-CM

## 2017-06-29 DIAGNOSIS — G4733 Obstructive sleep apnea (adult) (pediatric): Secondary | ICD-10-CM | POA: Diagnosis not present

## 2017-06-29 DIAGNOSIS — Z96659 Presence of unspecified artificial knee joint: Secondary | ICD-10-CM | POA: Diagnosis not present

## 2017-06-29 MED ORDER — OXYCODONE-ACETAMINOPHEN 7.5-325 MG PO TABS: 1.0000 | ORAL_TABLET | Freq: Four times a day (QID) | ORAL | 0 refills | Status: DC | PRN

## 2017-07-06 ENCOUNTER — Other Ambulatory Visit (HOSPITAL_COMMUNITY): Payer: Self-pay | Admitting: Psychiatry

## 2017-07-06 ENCOUNTER — Other Ambulatory Visit: Payer: Self-pay | Admitting: Orthopedic Surgery

## 2017-07-06 DIAGNOSIS — M25562 Pain in left knee: Secondary | ICD-10-CM

## 2017-07-06 DIAGNOSIS — F5105 Insomnia due to other mental disorder: Secondary | ICD-10-CM

## 2017-07-06 DIAGNOSIS — S8002XA Contusion of left knee, initial encounter: Secondary | ICD-10-CM

## 2017-07-12 ENCOUNTER — Other Ambulatory Visit: Payer: Self-pay | Admitting: Orthopedic Surgery

## 2017-07-12 DIAGNOSIS — M1711 Unilateral primary osteoarthritis, right knee: Secondary | ICD-10-CM

## 2017-07-13 ENCOUNTER — Other Ambulatory Visit: Payer: Self-pay | Admitting: Orthopedic Surgery

## 2017-07-13 DIAGNOSIS — M1711 Unilateral primary osteoarthritis, right knee: Secondary | ICD-10-CM

## 2017-07-13 MED ORDER — OXYCODONE-ACETAMINOPHEN 7.5-325 MG PO TABS: 1.0000 | ORAL_TABLET | Freq: Four times a day (QID) | ORAL | 0 refills | Status: DC | PRN

## 2017-07-13 NOTE — Progress Notes (Signed)
280 pwp aware

## 2017-07-17 ENCOUNTER — Ambulatory Visit: Payer: Self-pay | Admitting: Orthopedic Surgery

## 2017-07-23 ENCOUNTER — Encounter (HOSPITAL_COMMUNITY): Payer: Self-pay | Admitting: Psychiatry

## 2017-07-23 ENCOUNTER — Ambulatory Visit (HOSPITAL_COMMUNITY): Payer: Medicare HMO | Admitting: Psychiatry

## 2017-07-23 DIAGNOSIS — F5105 Insomnia due to other mental disorder: Secondary | ICD-10-CM

## 2017-07-23 DIAGNOSIS — Z560 Unemployment, unspecified: Secondary | ICD-10-CM | POA: Diagnosis not present

## 2017-07-23 DIAGNOSIS — Z736 Limitation of activities due to disability: Secondary | ICD-10-CM

## 2017-07-23 DIAGNOSIS — Z813 Family history of other psychoactive substance abuse and dependence: Secondary | ICD-10-CM | POA: Diagnosis not present

## 2017-07-23 DIAGNOSIS — M2550 Pain in unspecified joint: Secondary | ICD-10-CM

## 2017-07-23 DIAGNOSIS — Z87891 Personal history of nicotine dependence: Secondary | ICD-10-CM | POA: Diagnosis not present

## 2017-07-23 DIAGNOSIS — Z818 Family history of other mental and behavioral disorders: Secondary | ICD-10-CM | POA: Diagnosis not present

## 2017-07-23 DIAGNOSIS — F3130 Bipolar disorder, current episode depressed, mild or moderate severity, unspecified: Secondary | ICD-10-CM | POA: Diagnosis not present

## 2017-07-23 DIAGNOSIS — Z811 Family history of alcohol abuse and dependence: Secondary | ICD-10-CM

## 2017-07-23 DIAGNOSIS — R69 Illness, unspecified: Secondary | ICD-10-CM | POA: Diagnosis not present

## 2017-07-23 MED ORDER — FLUOXETINE HCL 20 MG PO CAPS: 20.0000 mg | ORAL_CAPSULE | Freq: Every day | ORAL | 2 refills | Status: DC

## 2017-07-23 MED ORDER — DIVALPROEX SODIUM 250 MG PO DR TAB: DELAYED_RELEASE_TABLET | ORAL | 2 refills | Status: DC

## 2017-07-23 MED ORDER — ZOLPIDEM TARTRATE 10 MG PO TABS
10.0000 mg | ORAL_TABLET | Freq: Every evening | ORAL | 2 refills | Status: DC | PRN
Start: 2017-07-23 — End: 2017-09-21

## 2017-07-23 MED ORDER — ALPRAZOLAM 0.5 MG PO TABS: ORAL_TABLET | ORAL | 2 refills | Status: DC

## 2017-07-23 NOTE — Progress Notes (Signed)
BH MD/PA/NP OP Progress Note  07/23/2017 11:27 AM Diane [name]  MRN:  [id number]  Chief Complaint:  Chief Complaint    Depression; Anxiety; Follow-up     HPI: She is a 66 year old white female with a history of bipolar disorder who comes in for follow-up today.  She is on disability and living [place].  The patient returns after 3 months.  She states she is doing "okay but states that she is seems to be more depressed and having lower energy.  Her boyfriend works the third shift and he sleeps during the day.  She seems to sleep both day and night and dozes off watching TV through the day.  She states that time she takes a Xanax and the muscle relaxer together and I warned her not to do this.  She has gained about 20 pounds in the last year and does not seem to have much energy for activity.  She states that there is "nothing to do" since he usually uses the car but she now can use it during the day and I urged her to get out more.  Since she is so low in energy I suggested we switch her Celexa to Prozac which may be helpful for this and she agrees.  She denies any severe mood swings anxiety or insomnia.  She states that she needs the Ambien otherwise she will not sleep at night Visit Diagnosis:    ICD-10-CM   1. Bipolar I disorder, most recent episode depressed (HCC) F31.30 divalproex (DEPAKOTE) 250 MG DR tablet  2. Insomnia due to mental disorder F51.05 ALPRAZolam (XANAX) 0.5 MG tablet    Past Psychiatric History: Patient has history of one psychiatric admission in [facility] She took overdose on her pills she was also intoxicated with alcohol. Patient has also seen by psychiatrist in 2004. At that time she was seeking more benzodiazepines and pain medication.  Past Medical History:  Past Medical History:  Diagnosis Date  . Allergic rhinitis   . Anxiety   . Arthritis   . Cataracts, bilateral   . Chronic back pain    Sees pain  mamagement clinic  . Depression   . GERD (gastroesophageal reflux disease)   . Headaches, cluster 3 yrs ago   last one  . High cholesterol   . Hypertension   . Hypothyroid   . Immature cataract   . Mania (HCC)   . PTSD (post-traumatic stress disorder)   . Seizures (HCC) 20 yrs ago   unknown etiology-on meds  . Sleep apnea    STOP BANG score=4  . 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[facility](HCC)     Past Surgical History:  Procedure Laterality Date  . BACK SURGERY    . CHOLECYSTECTOMY    . COLONOSCOPY  10/16/2008   WUJ:[id number]:Hemorrhoids, otherwise normal rectum; pancolonic diverticula the remainder of the colonic mucosa appeared normal. Next TCS due 10/2013.  Marland Kitchen. COLONOSCOPY  06/28/2006   RMR: Internal hemorrhoids. Diminutive rectal polyps, cold biopsied/ Pancolonic diverticula. Polyps in the right colon removed   . ESOPHAGOGASTRODUODENOSCOPY  10/16/2008   AOZ:HYQMVRMR:Small hiatal hernia otherwise normal esophagus, stomach  D1, D2, status post passage of a 56-French Maloney dilator  . ESOPHAGOGASTRODUODENOSCOPY  06/28/2006   HQI:ONGEXBRMR:Normal esophagus. Small hiatal hernia. Otherwise normal stomach, D1 and D2, status post passage of a 56 [place] Maloney dilator  . FOOT SURGERY     left foot-  . KNEE ARTHROSCOPY  01/2012   right  .  left foot  x 2  . LUMBAR DISC SURGERY  L4-5  . oophoectomy  bilateral  . PARTIAL HYSTERECTOMY      Family Psychiatric History: See below  Family History:  Family History  Problem Relation Age of Onset  . Alcohol abuse Mother   . Anxiety disorder Mother   . Alcohol abuse Father   . Anxiety disorder Father   . Bipolar disorder Sister   . Alcohol abuse Brother   . Anxiety disorder Brother   . Drug abuse Brother   . Dementia Paternal Aunt   . ADD / ADHD Grandchild   . ADD / ADHD Grandchild   . Drug abuse Brother   . Alcohol abuse Brother   . Anxiety disorder Brother   . Seizures Brother   . Alcohol abuse Brother   . Anxiety disorder Brother   . Seizures Brother   . Alcohol  abuse Brother   . Anxiety disorder Brother   . Alcohol abuse Brother   . Anxiety disorder Brother   . Heart disease Unknown   . Diabetes Unknown   . Alcohol abuse Unknown   . Depression Neg Hx   . OCD Neg Hx   . Paranoid behavior Neg Hx   . Schizophrenia Neg Hx   . Sexual abuse Neg Hx   . Physical abuse Neg Hx   . Colon cancer Neg Hx     Social History:  Social History   Socioeconomic History  . Marital status: Married    Spouse name: None  . Number of children: None  . Years of education: 12th grade  . Highest education level: None  Social Needs  . Financial resource strain: None  . Food insecurity - worry: None  . Food insecurity - inability: None  . Transportation needs - medical: None  . Transportation needs - non-medical: None  Occupational History  . Occupation: disabled    Associate Professor: UNEMPLOYED  Tobacco Use  . Smoking status: Former Smoker    Packs/day: 0.25    Years: 5.00    Pack years: 1.25    Types: Cigarettes    Last attempt to quit: 06/26/2003    Years since quitting: 14.0  . Smokeless tobacco: Never Used  Substance and Sexual Activity  . Alcohol use: Yes    Alcohol/week: 0.0 oz    Comment: occasionally; once a month.  . Drug use: No    Comment: has a past history of street drug use  . Sexual activity: No    Birth control/protection: Surgical  Other Topics Concern  . None  Social History Narrative  . None    Allergies:  penicillin  Metabolic Disorder Labs: Lab Results  Component Value Date   HGBA1C 5.8 (H) 01/25/2012   MPG 120 (H) 01/25/2012   No results found for: PROLACTIN Lab Results  Component Value Date   CHOL  12/10/2010    69        ATP III CLASSIFICATION:  <200     mg/dL   Desirable  161-096  mg/dL   Borderline High  >=045    mg/dL   High          TRIG 38 12/10/2010   HDL 28 (L) 12/10/2010   CHOLHDL 2.5 12/10/2010   VLDL 8 12/10/2010   LDLCALC  12/10/2010    33        Total Cholesterol/HDL:CHD Risk Coronary Heart  Disease Risk Table  Men   Women  1/2 Average Risk   3.4   3.3  Average Risk       5.0   4.4  2 X Average Risk   9.6   7.1  3 X Average Risk  23.4   11.0        Use the calculated Patient Ratio above and the CHD Risk Table to determine the patient's CHD Risk.        ATP III CLASSIFICATION (LDL):  <100     mg/dL   Optimal  161-096  mg/dL   Near or Above                    Optimal  130-159  mg/dL   Borderline  045-409  mg/dL   High  >811     mg/dL   Very High   LDLCALC 71 07/12/2009   Lab Results  Component Value Date   TSH <0.008 (L) 12/09/2010   TSH 0.434 07/12/2009    Therapeutic Level Labs: No results found for: LITHIUM Lab Results  Component Value Date   VALPROATE 71.5 01/30/2017   VALPROATE 24.0 (L) 07/10/2014   No components found for:  CBMZ  Current Medications: Current Outpatient Medications  Medication Sig Dispense Refill  . ALPRAZolam (XANAX) 0.5 MG tablet Take one twice a day and 2 at bedtime 120 tablet 2  . AMITIZA 24 MCG capsule TAKE ONE CAPSULE BY MOUTH TWICE DAILY WITH A MEAL. 60 capsule 5  . amLODipine (NORVASC) 5 MG tablet Take 5 mg by mouth daily.    Marland Kitchen atorvastatin (LIPITOR) 20 MG tablet Take 20 mg by mouth daily.  1  . cyclobenzaprine (FLEXERIL) 10 MG tablet Take 1 tablet (10 mg total) by mouth 3 (three) times daily as needed for muscle spasms. 90 tablet 5  . divalproex (DEPAKOTE) 250 MG DR tablet TAKE THREE (3) TABLETS BY MOUTH AT BEDTIME 90 tablet 2  . levothyroxine (SYNTHROID, LEVOTHROID) 150 MCG tablet Take 150 mcg by mouth daily.  5  . linaclotide (LINZESS) 290 MCG CAPS capsule Take 1 capsule (290 mcg total) by mouth daily before breakfast. 90 capsule 3  . lisinopril (PRINIVIL,ZESTRIL) 10 MG tablet Take 10 mg by mouth daily.    . meloxicam (MOBIC) 7.5 MG tablet TAKE ONE TABLET BY MOUTH DAILY. 30 tablet 5  . oxybutynin (DITROPAN-XL) 5 MG 24 hr tablet Take 5 mg by mouth at bedtime.    Marland Kitchen oxyCODONE-acetaminophen (PERCOCET) 7.5-325  MG tablet Take 1 tablet by mouth every 6 (six) hours as needed. 56 tablet 0  . pantoprazole (PROTONIX) 40 MG tablet TAKE ONE TABLET BY MOUTH TWICE DAILY. 60 tablet 3  . FLUoxetine (PROZAC) 20 MG capsule Take 1 capsule (20 mg total) daily by mouth. 30 capsule 2  . zolpidem (AMBIEN) 10 MG tablet Take 1 tablet (10 mg total) at bedtime as needed by mouth for sleep. 30 tablet 2   No current facility-administered medications for this visit.      Musculoskeletal: Strength & Muscle Tone: within normal limits Gait & Station: unsteady Patient leans: N/A  Psychiatric Specialty Exam: Review of Systems  Musculoskeletal: Positive for joint pain.  Psychiatric/Behavioral: Positive for depression.  All other systems reviewed and are negative.   Blood pressure (!) 140/92, pulse 73, height 5\' 3"  (1.6 m), weight 232 lb (105.2 kg), SpO2 96 %.Body mass index is 41.1 kg/m.  General Appearance: Casual and Fairly Groomed  Eye Contact:  Fair  Speech:  Clear and Coherent  Volume:  Normal  Mood:  Dysphoric  Affect:  Constricted  Thought Process:  Goal Directed  Orientation:  Full (Time, Place, and Person)  Thought Content: Rumination   Suicidal Thoughts:  No  Homicidal Thoughts:  No  Memory:  Immediate;   Good Recent;   Fair Remote;   Fair  Judgement:  Poor  Insight:  Lacking  Psychomotor Activity:  Decreased  Concentration:  Concentration: Fair and Attention Span: Fair  Recall:  F[employer]Fair  Fund of Knowledge: Good  Language: Good  Akathisia:  No  Handed:  Right  AIMS (if indicated): not done  Assets:  Communication Skills Desire for Improvement Resilience Social Support  ADL's:  Intact  Cognition: WNL  Sleep:  Good   Screenings:   Assessment and Plan: This is a 66 year old female with a history of bipolar disorder.  She feels like she is getting more depressed and her energy and activity level are low.  Therefore she will discontinue Celexa and start Prozac 20 mg every morning.  She will for  now continue Depakote 150 mg at bedtime for mood stabilization, Xanax 0.5 mg a day and 1 mg at bedtime for anxiety and Ambien 10 mg if needed for sleep.  I warned her not to combine the muscle relaxer with the Xanax and to use the Xanax sparingly.  She appears somewhat drowsy today.  She will return to see me in 2 months   Diannia RuderOSS, Aubriegh Minch, MD 07/23/2017, 11:27 AM

## 2017-07-25 ENCOUNTER — Ambulatory Visit: Payer: Medicare HMO | Admitting: Orthopedic Surgery

## 2017-07-25 ENCOUNTER — Ambulatory Visit (INDEPENDENT_AMBULATORY_CARE_PROVIDER_SITE_OTHER): Payer: Medicare HMO

## 2017-07-25 ENCOUNTER — Encounter: Payer: Self-pay | Admitting: Orthopedic Surgery

## 2017-07-25 VITALS — BP 183/103 | HR 71 | Ht 63.0 in | Wt 223.0 lb

## 2017-07-25 DIAGNOSIS — R69 Illness, unspecified: Secondary | ICD-10-CM | POA: Diagnosis not present

## 2017-07-25 DIAGNOSIS — F3130 Bipolar disorder, current episode depressed, mild or moderate severity, unspecified: Secondary | ICD-10-CM

## 2017-07-25 DIAGNOSIS — M1711 Unilateral primary osteoarthritis, right knee: Secondary | ICD-10-CM | POA: Diagnosis not present

## 2017-07-25 DIAGNOSIS — Z96651 Presence of right artificial knee joint: Secondary | ICD-10-CM

## 2017-07-25 DIAGNOSIS — I10 Essential (primary) hypertension: Secondary | ICD-10-CM | POA: Diagnosis not present

## 2017-07-25 DIAGNOSIS — R52 Pain, unspecified: Secondary | ICD-10-CM

## 2017-07-25 MED ORDER — OXYCODONE-ACETAMINOPHEN 7.5-325 MG PO TABS: 1.0000 | ORAL_TABLET | Freq: Four times a day (QID) | ORAL | 0 refills | Status: DC | PRN

## 2017-07-25 NOTE — Progress Notes (Signed)
Progress Note   Patient ID: Diane [name], female   DOB: [birth year], 66 y.o.   MRN: [id number]  Chief Complaint  Patient presents with  . Routine Post Op    yearly follow up s/p Right knee replacement 07/01/12    HPI  66 year old female presents for annual follow-up after right total knee which did require revision for patellofemoral problem.  She still continues continues to complain of pain in her right knee.  She is being worked up for lumbar spine disease she has some mild.  Her pain is dull mild intensity is intermittently high as well as low  Pain located along the lateral edge of the patellofemoral joint   Review of Systems  Constitutional: Negative for chills and fever.  Musculoskeletal: Positive for back pain.  Neurological: Negative for tingling, sensory change and focal weakness.   Current Meds  Medication Sig  . ALPRAZolam (XANAX) 0.5 MG tablet Take one twice a day and 2 at bedtime  . AMITIZA 24 MCG capsule TAKE ONE CAPSULE BY MOUTH TWICE DAILY WITH A MEAL.  Marland Kitchen. amLODipine (NORVASC) 5 MG tablet Take 5 mg by mouth daily.  Marland Kitchen. atorvastatin (LIPITOR) 20 MG tablet Take 20 mg by mouth daily.  . cyclobenzaprine (FLEXERIL) 10 MG tablet Take 1 tablet (10 mg total) by mouth 3 (three) times daily as needed for muscle spasms.  . divalproex (DEPAKOTE) 250 MG DR tablet TAKE THREE (3) TABLETS BY MOUTH AT BEDTIME  . escitalopram (LEXAPRO) 20 MG tablet Take 20 mg daily by mouth.  Marland Kitchen. FLUoxetine (PROZAC) 20 MG capsule Take 1 capsule (20 mg total) daily by mouth.  . levothyroxine (SYNTHROID, LEVOTHROID) 150 MCG tablet Take 150 mcg by mouth daily.  Marland Kitchen. linaclotide (LINZESS) 290 MCG CAPS capsule Take 1 capsule (290 mcg total) by mouth daily before breakfast.  . lisinopril (PRINIVIL,ZESTRIL) 10 MG tablet Take 10 mg by mouth daily.  . meloxicam (MOBIC) 7.5 MG tablet TAKE ONE TABLET BY MOUTH DAILY.  Marland Kitchen. oxybutynin (DITROPAN-XL) 5 MG 24 hr tablet Take 5 mg by mouth at bedtime.  Marland Kitchen.  oxyCODONE-acetaminophen (PERCOCET) 7.5-325 MG tablet Take 1 tablet by mouth every 6 (six) hours as needed.  . pantoprazole (PROTONIX) 40 MG tablet TAKE ONE TABLET BY MOUTH TWICE DAILY.  Marland Kitchen. zolpidem (AMBIEN) 10 MG tablet Take 1 tablet (10 mg total) at bedtime as needed by mouth for sleep.    Past Medical History:  Diagnosis Date  . Allergic rhinitis   . Anxiety   . Arthritis   . Cataracts, bilateral   . Chronic back pain    Sees pain mamagement clinic  . Depression   . GERD (gastroesophageal reflux disease)   . Headaches, cluster 3 yrs ago   last one  . High cholesterol   . Hypertension   . Hypothyroid   . Immature cataract   . Mania (HCC)   . PTSD (post-traumatic stress disorder)   . Seizures (HCC) 20 yrs ago   unknown etiology-on meds  . Sleep apnea    STOP BANG score=4  . 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(HCC)        BP (!) 183/103   Pulse 71   Ht 5\' 3"  (1.6 m)   Wt 223 lb (101.2 kg)   BMI 39.50 kg/m    Physical Exam  Constitutional: She is oriented to person, place, and time. She appears well-developed and well-nourished. No distress.  Musculoskeletal:       Right knee: She exhibits decreased range of motion and  abnormal patellar mobility. She exhibits no swelling, no effusion, no ecchymosis, no deformity, no laceration, no erythema, normal alignment, no LCL laxity, no bony tenderness, normal meniscus and no MCL laxity. Tenderness found. No medial joint line, no lateral joint line, no MCL, no LCL and no patellar tendon tenderness noted.       Left knee: She exhibits decreased range of motion. She exhibits no swelling, no effusion, no ecchymosis, no deformity, no laceration, no erythema, normal alignment, no LCL laxity, normal patellar mobility, no bony tenderness, normal meniscus and no MCL laxity. Tenderness found. Medial joint line tenderness noted. No lateral joint line, no MCL, no LCL and no patellar tendon tenderness noted.       Legs: Neurological: She is alert and  oriented to person, place, and time.  Skin: Skin is warm. Capillary refill takes less than 2 seconds. No rash noted. She is diaphoretic. No erythema. No pallor.  Psychiatric: She has a normal mood and affect. Her behavior is normal. Judgment and thought content normal.    Right Knee Exam   Other  Effusion: no effusion present   Left Knee Exam   Other  Effusion: no effusion present       Medical decision-making  Imaging: Today's x-ray report is dictated please see for details patient basically has a stable total knee with normal alignment of mild patellofemoral tilt no loosening is seen    Encounter Diagnoses  Name Primary?  . 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 Bipolar I disorder, most recent episode depressed (HCC)   . Arthritis of knee, right   . Pain management   . Essential hypertension    Right total knee in 2013 with revision of patellofemoral compartment component stable with persistent pain no change  Chronic pain management pain control with oxycodone 7.5 mg with refill  Exacerbation of hypertension patient advised to check her blood pressure over the next 3 weeks, contact her primary care doctor if systolic number remains high   Meds ordered this encounter  Medications  . escitalopram (LEXAPRO) 20 MG tablet    Sig: Take 20 mg daily by mouth.    Refill:  2  . oxyCODONE-acetaminophen (PERCOCET) 7.5-325 MG tablet    Sig: Take 1 tablet every 6 (six) hours as needed by mouth.    Dispense:  56 tablet    Refill:  0     Fuller CanadaStanley Katrin Grabel, MD 07/25/2017 11:22 AM

## 2017-07-30 DIAGNOSIS — Z96659 Presence of unspecified artificial knee joint: Secondary | ICD-10-CM | POA: Diagnosis not present

## 2017-07-30 DIAGNOSIS — G4733 Obstructive sleep apnea (adult) (pediatric): Secondary | ICD-10-CM | POA: Diagnosis not present

## 2017-08-08 ENCOUNTER — Ambulatory Visit: Payer: Medicare HMO | Admitting: Gastroenterology

## 2017-08-09 ENCOUNTER — Telehealth: Payer: Self-pay | Admitting: Orthopedic Surgery

## 2017-08-09 NOTE — Telephone Encounter (Signed)
Call from patient to request refill of medication, states phone access/internet not available.  oxyCODONE-acetaminophen (PERCOCET) 7.5-325 MG tablet 56 tablet

## 2017-08-10 ENCOUNTER — Other Ambulatory Visit: Payer: Self-pay | Admitting: Orthopedic Surgery

## 2017-08-10 DIAGNOSIS — M1711 Unilateral primary osteoarthritis, right knee: Secondary | ICD-10-CM

## 2017-08-10 MED ORDER — OXYCODONE-ACETAMINOPHEN 7.5-325 MG PO TABS: 1.0000 | ORAL_TABLET | Freq: Four times a day (QID) | ORAL | 0 refills | Status: DC | PRN

## 2017-08-10 NOTE — Telephone Encounter (Signed)
Patient aware.

## 2017-08-10 NOTE — Telephone Encounter (Signed)
Pick up ofice

## 2017-08-15 DIAGNOSIS — R69 Illness, unspecified: Secondary | ICD-10-CM | POA: Diagnosis not present

## 2017-08-15 DIAGNOSIS — K08109 Complete loss of teeth, unspecified cause, unspecified class: Secondary | ICD-10-CM | POA: Diagnosis not present

## 2017-08-15 DIAGNOSIS — I10 Essential (primary) hypertension: Secondary | ICD-10-CM | POA: Diagnosis not present

## 2017-08-15 DIAGNOSIS — E7800 Pure hypercholesterolemia, unspecified: Secondary | ICD-10-CM | POA: Diagnosis not present

## 2017-08-15 DIAGNOSIS — G4700 Insomnia, unspecified: Secondary | ICD-10-CM | POA: Diagnosis not present

## 2017-08-15 DIAGNOSIS — E6609 Other obesity due to excess calories: Secondary | ICD-10-CM | POA: Diagnosis not present

## 2017-08-15 DIAGNOSIS — E039 Hypothyroidism, unspecified: Secondary | ICD-10-CM | POA: Diagnosis not present

## 2017-08-15 DIAGNOSIS — Z0000 Encounter for general adult medical examination without abnormal findings: Secondary | ICD-10-CM | POA: Diagnosis not present

## 2017-08-15 DIAGNOSIS — G4730 Sleep apnea, unspecified: Secondary | ICD-10-CM | POA: Diagnosis not present

## 2017-08-23 DIAGNOSIS — Z23 Encounter for immunization: Secondary | ICD-10-CM | POA: Diagnosis not present

## 2017-08-23 DIAGNOSIS — Z6841 Body Mass Index (BMI) 40.0 and over, adult: Secondary | ICD-10-CM | POA: Diagnosis not present

## 2017-08-23 DIAGNOSIS — Z1389 Encounter for screening for other disorder: Secondary | ICD-10-CM | POA: Diagnosis not present

## 2017-08-23 DIAGNOSIS — I10 Essential (primary) hypertension: Secondary | ICD-10-CM | POA: Diagnosis not present

## 2017-08-23 DIAGNOSIS — E039 Hypothyroidism, unspecified: Secondary | ICD-10-CM | POA: Diagnosis not present

## 2017-08-23 DIAGNOSIS — G8929 Other chronic pain: Secondary | ICD-10-CM | POA: Diagnosis not present

## 2017-08-23 DIAGNOSIS — G4733 Obstructive sleep apnea (adult) (pediatric): Secondary | ICD-10-CM | POA: Diagnosis not present

## 2017-08-24 ENCOUNTER — Telehealth: Payer: Self-pay | Admitting: Orthopedic Surgery

## 2017-08-24 ENCOUNTER — Other Ambulatory Visit: Payer: Self-pay | Admitting: Orthopedic Surgery

## 2017-08-24 DIAGNOSIS — M1711 Unilateral primary osteoarthritis, right knee: Secondary | ICD-10-CM

## 2017-08-24 MED ORDER — OXYCODONE-ACETAMINOPHEN 7.5-325 MG PO TABS: 1.0000 | ORAL_TABLET | Freq: Four times a day (QID) | ORAL | 0 refills | Status: DC | PRN

## 2017-08-24 NOTE — Telephone Encounter (Signed)
[place]

## 2017-08-24 NOTE — Telephone Encounter (Signed)
Patient called for refill: oxyCODONE-acetaminophen (PERCOCET) 7.5-325 MG tablet 56 tablet  -pharmacy [facility] [place]

## 2017-08-27 ENCOUNTER — Other Ambulatory Visit: Payer: Self-pay | Admitting: Orthopedic Surgery

## 2017-08-27 ENCOUNTER — Telehealth: Payer: Self-pay | Admitting: Orthopedic Surgery

## 2017-08-27 MED ORDER — OXYCODONE-ACETAMINOPHEN 7.5-325 MG PO TABS: 1.0000 | ORAL_TABLET | Freq: Four times a day (QID) | ORAL | 0 refills | Status: DC | PRN

## 2017-08-27 NOTE — Telephone Encounter (Signed)
When I routed message to you patient wanted Rx sent to [name], [facility]  I called patient back since Okey RegalCarol has put in a new note about [facility]  I confirmed with patient, please send the Rx for O[facility]codone [name] correct pharmacy has been saved.

## 2017-08-27 NOTE — Telephone Encounter (Signed)
Called patient to notify

## 2017-08-27 NOTE — Telephone Encounter (Signed)
Patient had called back in the interim and relayed she called her insurance (prescription plan with [facility]) [facility] she [facility] - please advise.

## 2017-08-27 NOTE — Progress Notes (Signed)
PWP checked

## 2017-08-27 NOTE — Telephone Encounter (Signed)
[name]?

## 2017-08-27 NOTE — Telephone Encounter (Signed)
Explain more  I dont understand???

## 2017-08-27 NOTE — Telephone Encounter (Signed)
Call received from Ree KidaJack, pharmacist at [facility], [place], ph# [phone] - states received the E-prescribed refill request for: oxyCODONE-acetaminophen (PERCOCET) 7.5-325 MG tablet 56 tablet 0 08/24/2017    Sig - Route: Take 1 tablet by mouth every 6 (six) hours as needed for severe pain     - Patient relays she has actually been using [name] in [place]. Please advise.

## 2017-08-29 DIAGNOSIS — Z96659 Presence of unspecified artificial knee joint: Secondary | ICD-10-CM | POA: Diagnosis not present

## 2017-08-29 DIAGNOSIS — G4733 Obstructive sleep apnea (adult) (pediatric): Secondary | ICD-10-CM | POA: Diagnosis not present

## 2017-09-05 DIAGNOSIS — Z96659 Presence of unspecified artificial knee joint: Secondary | ICD-10-CM | POA: Diagnosis not present

## 2017-09-05 DIAGNOSIS — G4733 Obstructive sleep apnea (adult) (pediatric): Secondary | ICD-10-CM | POA: Diagnosis not present

## 2017-09-10 ENCOUNTER — Telehealth: Payer: Self-pay | Admitting: Orthopedic Surgery

## 2017-09-10 NOTE — Telephone Encounter (Signed)
Patient requests refill:  oxyCODONE-acetaminophen (PERCOCET) 7.5-325 MG tablet 56 tablet

## 2017-09-12 ENCOUNTER — Other Ambulatory Visit: Payer: Self-pay | Admitting: Orthopedic Surgery

## 2017-09-12 MED ORDER — OXYCODONE-ACETAMINOPHEN 7.5-325 MG PO TABS: 1.0000 | ORAL_TABLET | Freq: Four times a day (QID) | ORAL | 0 refills | Status: DC | PRN

## 2017-09-21 ENCOUNTER — Ambulatory Visit (HOSPITAL_COMMUNITY): Payer: Medicare HMO | Admitting: Psychiatry

## 2017-09-21 ENCOUNTER — Encounter (HOSPITAL_COMMUNITY): Payer: Self-pay | Admitting: Psychiatry

## 2017-09-21 DIAGNOSIS — F3130 Bipolar disorder, current episode depressed, mild or moderate severity, unspecified: Secondary | ICD-10-CM | POA: Diagnosis not present

## 2017-09-21 DIAGNOSIS — R69 Illness, unspecified: Secondary | ICD-10-CM | POA: Diagnosis not present

## 2017-09-21 DIAGNOSIS — Z811 Family history of alcohol abuse and dependence: Secondary | ICD-10-CM | POA: Diagnosis not present

## 2017-09-21 DIAGNOSIS — F5105 Insomnia due to other mental disorder: Secondary | ICD-10-CM

## 2017-09-21 DIAGNOSIS — Z87891 Personal history of nicotine dependence: Secondary | ICD-10-CM

## 2017-09-21 DIAGNOSIS — Z818 Family history of other mental and behavioral disorders: Secondary | ICD-10-CM

## 2017-09-21 DIAGNOSIS — Z810 Family history of intellectual disabilities: Secondary | ICD-10-CM

## 2017-09-21 DIAGNOSIS — Z560 Unemployment, unspecified: Secondary | ICD-10-CM

## 2017-09-21 DIAGNOSIS — M2550 Pain in unspecified joint: Secondary | ICD-10-CM | POA: Diagnosis not present

## 2017-09-21 DIAGNOSIS — Z813 Family history of other psychoactive substance abuse and dependence: Secondary | ICD-10-CM

## 2017-09-21 MED ORDER — FLUOXETINE HCL 20 MG PO CAPS: 20.0000 mg | ORAL_CAPSULE | Freq: Every day | ORAL | 2 refills | Status: DC

## 2017-09-21 MED ORDER — ALPRAZOLAM 0.5 MG PO TABS: ORAL_TABLET | ORAL | 2 refills | Status: DC

## 2017-09-21 MED ORDER — TEMAZEPAM 30 MG PO CAPS: 30.0000 mg | ORAL_CAPSULE | Freq: Every evening | ORAL | 2 refills | Status: DC | PRN

## 2017-09-21 MED ORDER — DIVALPROEX SODIUM 250 MG PO DR TAB: DELAYED_RELEASE_TABLET | ORAL | 2 refills | Status: DC

## 2017-09-21 NOTE — Progress Notes (Signed)
BH MD/PA/NP OP Progress Note  09/21/2017 11:20 AM Diane [name]  MRN:  [id number]  Chief Complaint:  Chief Complaint    Depression; Anxiety; Follow-up     HPI: She is a 67 year old white female with a history of bipolar disorder who comes in for follow-up today.  She is on disability and living with her boyfriend in [place].  The patient returns after 6 weeks.  Last time she complained of being depressed and having no energy.  We decided to switch from Lexapro to Prozac 20 mg daily.  She seems to have more energy now.  She is not getting out too much but she is doing a lot more at home.  She is not sleeping well with Ambien and it often takes her about 2 hours to get to sleep.  She states a lot of this has to do with chronic right knee pain.  She has had total knee replacement in 2013 but it still hurts a lot.  Her orthopedic is offered to do another surgery but she does not want to.  I suggested we switch from Ambien to Restoril and she is willing to give this a try.  I told her if she is more active during the day perhaps through some sort of water exercise she will probably sleep better and she is agreeing to look into this. Visit Diagnosis:    ICD-10-CM   1. Bipolar I disorder, most recent episode depressed (HCC) F31.30 divalproex (DEPAKOTE) 250 MG DR tablet  2. Insomnia due to mental disorder F51.05 ALPRAZolam (XANAX) 0.5 MG tablet    Past Psychiatric History: Patient has a history of one psychiatric admission in [facility] the [facility].  At that time she overdosed on her pills and also was intoxicated with alcohol.  She has had outpatient treatment ever since  Past Medical History:  Past Medical History:  Diagnosis Date  . Allergic rhinitis   . Anxiety   . Arthritis   . Cataracts, bilateral   . Chronic back pain    Sees pain mamagement clinic  . Depression   . GERD (gastroesophageal reflux disease)   . Headaches, cluster 3 yrs ago   last one  . High cholesterol    . Hypertension   . Hypothyroid   . Immature cataract   . Mania (HCC)   . PTSD (post-traumatic stress disorder)   . Seizures (HCC) 20 yrs ago   unknown etiology-on meds  . Sleep apnea    STOP BANG score=4  . 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[facility])     Past Surgical History:  Procedure Laterality Date  . BACK SURGERY    . CATARACT EXTRACTION W/PHACO Left 01/04/2016   Procedure: CATARACT EXTRACTION PHACO AND INTRAOCULAR LENS PLACEMENT (IOC);  Surgeon: Jethro Bolus, MD;  Location: [facility];  Service: Ophthalmology;  Laterality: Left;  CDE:4.70  . CATARACT EXTRACTION W/PHACO Right 01/18/2016   Procedure: CATARACT EXTRACTION PHACO AND INTRAOCULAR LENS PLACEMENT RIGHT EYE; CDE:  4.66;  Surgeon: Jethro Bolus, MD;  Location: [facility];  Service: Ophthalmology;  Laterality: Right;  . CHOLECYSTECTOMY    . COLONOSCOPY  10/16/2008   WUJ:[id number], otherwise normal rectum; pancolonic diverticula the remainder of the colonic mucosa appeared normal. Next TCS due 10/2013.  Marland Kitchen COLONOSCOPY  06/28/2006   RMR: Internal hemorrhoids. Diminutive rectal polyps, cold biopsied/ Pancolonic diverticula. Polyps in the right colon removed   . COLONOSCOPY WITH PROPOFOL N/A 05/20/2015   RMR: Pancolonic diverticulosis. Redundant colon   . ESOPHAGOGASTRODUODENOSCOPY  10/16/2008   XBJ:YNWGN hiatal hernia otherwise normal esophagus, stomach  D1, D2, status post passage of a 56-French Maloney dilator  . ESOPHAGOGASTRODUODENOSCOPY  06/28/2006   FAO:ZHYQMV esophagus. Small hiatal hernia. Otherwise normal stomach, D1 and D2, status post passage of a 56 [place] Maloney dilator  . ESOPHAGOGASTRODUODENOSCOPY (EGD) WITH ESOPHAGEAL DILATION N/A 07/14/2013   RMR: Abnormal esophagus of uncertain significance-status post esophageal biospy. small hiatal hernia.   Marland Kitchen ESOPHAGOGASTRODUODENOSCOPY (EGD) WITH PROPOFOL N/A 05/20/2015   RMR: NORMAL EGD status post maloney dilation   . FOOT SURGERY     left foot-  . INCISION AND DRAINAGE Right 01/03/2013    Procedure: INCISION AND DRAINAGE;  Surgeon: Vickki Hearing, MD;  Location: [facility];  Service: Orthopedics;  Laterality: Right;  . KNEE ARTHROSCOPY  01/2012   right  . KNEE ARTHROSCOPY WITH LATERAL RELEASE Right 01/03/2013   Procedure: Lateral Release Patella Right Knee;  Surgeon: Vickki Hearing, MD;  Location: [facility];  Service: Orthopedics;  Laterality: Right;  . left foot  x 2  . LUMBAR DISC SURGERY  L4-5  . MALONEY DILATION N/A 05/20/2015   Procedure: Elease Hashimoto DILATION;  Surgeon: Corbin Ade, MD;  Location: [facility];  Service: Endoscopy;  Laterality: N/A;  Maloney dilator # 54  . oophoectomy  bilateral  . PARTIAL HYSTERECTOMY    . TOTAL KNEE ARTHROPLASTY  07/01/2012   Procedure: TOTAL KNEE ARTHROPLASTY;  Surgeon: Vickki Hearing, MD;  Location: [facility];  Service: Orthopedics;  Laterality: Right;  . TOTAL KNEE REVISION Right 01/03/2013   Procedure: Patellaplasty Right Knee;  Surgeon: Vickki Hearing, MD;  Location: [facility];  Service: Orthopedics;  Laterality: Right;    Family Psychiatric History: See below  Family History:  Family History  Problem Relation Age of Onset  . Alcohol abuse Mother   . Anxiety disorder Mother   . Alcohol abuse Father   . Anxiety disorder Father   . Bipolar disorder Sister   . Alcohol abuse Brother   . Anxiety disorder Brother   . Drug abuse Brother   . Dementia Paternal Aunt   . ADD / ADHD Grandchild   . ADD / ADHD Grandchild   . Drug abuse Brother   . Alcohol abuse Brother   . Anxiety disorder Brother   . Seizures Brother   . Alcohol abuse Brother   . Anxiety disorder Brother   . Seizures Brother   . Alcohol abuse Brother   . Anxiety disorder Brother   . Alcohol abuse Brother   . Anxiety disorder Brother   . Heart disease Unknown   . Diabetes Unknown   . Alcohol abuse Unknown   . Depression Neg Hx   . OCD Neg Hx   . Paranoid behavior Neg Hx   . Schizophrenia Neg Hx   . Sexual abuse Neg Hx   . Physical abuse Neg Hx   . Colon  cancer Neg Hx     Social History:  Social History   Socioeconomic History  . Marital status: Married    Spouse name: None  . Number of children: None  . Years of education: 12th grade  . Highest education level: None  Social Needs  . Financial resource strain: None  . Food insecurity - worry: None  . Food insecurity - inability: None  . Transportation needs - medical: None  . Transportation needs - non-medical: None  Occupational History  . Occupation: disabled    Associate Professor: UNEMPLOYED  Tobacco Use  . Smoking  status: Former Smoker    Packs/day: 0.25    Years: 5.00    Pack years: 1.25    Types: Cigarettes    Last attempt to quit: 06/26/2003    Years since quitting: 14.2  . Smokeless tobacco: Never Used  Substance and Sexual Activity  . Alcohol use: Yes    Alcohol/week: 0.0 oz    Comment: occasionally; once a month.  . Drug use: No    Comment: has a past history of street drug use  . Sexual activity: No    Birth control/protection: Surgical  Other Topics Concern  . None  Social History Narrative  . None    Allergies:   Metabolic Disorder Labs: Lab Results  Component Value Date   HGBA1C 5.8 (H) 01/25/2012   MPG 120 (H) 01/25/2012   No results found for: PROLACTIN Lab Results  Component Value Date   CHOL  12/10/2010    69        ATP III CLASSIFICATION:  <200     mg/dL   Desirable  960-454200-239  mg/dL   Borderline High  >=098>=240    mg/dL   High          TRIG 38 12/10/2010   HDL 28 (L) 12/10/2010   CHOLHDL 2.5 12/10/2010   VLDL 8 12/10/2010   LDLCALC  12/10/2010    33        Total Cholesterol/HDL:CHD Risk Coronary Heart Disease Risk Table                     Men   Women  1/2 Average Risk   3.4   3.3  Average Risk       5.0   4.4  2 X Average Risk   9.6   7.1  3 X Average Risk  23.4   11.0        Use the calculated Patient Ratio above and the CHD Risk Table to determine the patient's CHD Risk.        ATP III CLASSIFICATION (LDL):  <100     mg/dL    Optimal  119-147100-129  mg/dL   Near or Above                    Optimal  130-159  mg/dL   Borderline  829-562160-189  mg/dL   High  >130>190     mg/dL   Very High   LDLCALC 71 07/12/2009   Lab Results  Component Value Date   TSH <0.008 (L) 12/09/2010   TSH 0.434 07/12/2009    Therapeutic Level Labs: No results found for: LITHIUM Lab Results  Component Value Date   VALPROATE 71.5 01/30/2017   VALPROATE 24.0 (L) 07/10/2014   No components found for:  CBMZ  Current Medications: Current Outpatient Medications  Medication Sig Dispense Refill  . ALPRAZolam (XANAX) 0.5 MG tablet Take one twice a day and 2 at bedtime 120 tablet 2  . AMITIZA 24 MCG capsule TAKE ONE CAPSULE BY MOUTH TWICE DAILY WITH A MEAL. 60 capsule 5  . amLODipine (NORVASC) 5 MG tablet Take 5 mg by mouth daily.    Marland Kitchen. atorvastatin (LIPITOR) 20 MG tablet Take 20 mg by mouth daily.  1  . cyclobenzaprine (FLEXERIL) 10 MG tablet TAKE ONE TABLET BY MOUTH THREE TIMES DAILY AS NEEDED FOR MUSCLE SPASMS. 90 tablet 5  . divalproex (DEPAKOTE) 250 MG DR tablet TAKE THREE (3) TABLETS BY MOUTH AT BEDTIME 90 tablet 2  .  FLUoxetine (PROZAC) 20 MG capsule Take 1 capsule (20 mg total) by mouth daily. 30 capsule 2  . levothyroxine (SYNTHROID, LEVOTHROID) 150 MCG tablet Take 150 mcg by mouth daily.  5  . linaclotide (LINZESS) 290 MCG CAPS capsule Take 1 capsule (290 mcg total) by mouth daily before breakfast. 90 capsule 3  . lisinopril (PRINIVIL,ZESTRIL) 10 MG tablet Take 10 mg by mouth daily.    . meloxicam (MOBIC) 7.5 MG tablet TAKE ONE TABLET BY MOUTH DAILY. 30 tablet 5  . oxybutynin (DITROPAN-XL) 5 MG 24 hr tablet Take 5 mg by mouth at bedtime.    Marland Kitchen oxyCODONE-acetaminophen (PERCOCET) 7.5-325 MG tablet Take 1 tablet by mouth every 6 (six) hours as needed for severe pain. 56 tablet 0  . oxyCODONE-acetaminophen (PERCOCET) 7.5-325 MG tablet TAKE ONE TABLET BY MOUTH EVERY 6 HOURS AS NEEDED FOR SEVERE PAIN 56 tablet 0  . pantoprazole (PROTONIX) 40 MG  tablet TAKE ONE TABLET BY MOUTH TWICE DAILY. 60 tablet 3  . temazepam (RESTORIL) 30 MG capsule Take 1 capsule (30 mg total) by mouth at bedtime as needed for sleep. 30 capsule 2   No current facility-administered medications for this visit.      Musculoskeletal: Strength & Muscle Tone: decreased Gait & Station: unsteady Patient leans: N/A  Psychiatric Specialty Exam: Review of Systems  Musculoskeletal: Positive for joint pain.  Psychiatric/Behavioral: The patient has insomnia.   All other systems reviewed and are negative.   Blood pressure (!) 161/87, pulse 87, height 5\' 3"  (1.6 m), weight 238 lb (108 kg), SpO2 94 %.Body mass index is 42.16 kg/m.  General Appearance: Casual and Fairly Groomed  Eye Contact:  Fair  Speech:  Clear and Coherent  Volume:  Decreased  Mood:  Euthymic  Affect:  Congruent  Thought Process:  Goal Directed  Orientation:  Full (Time, Place, and Person)  Thought Content: WDL   Suicidal Thoughts:  No  Homicidal Thoughts:  No  Memory:  Immediate;   Good Recent;   Fair Remote;   Fair  Judgement:  Poor  Insight:  Lacking  Psychomotor Activity:  Decreased  Concentration:  Concentration: Fair and Attention Span: Fair  Recall:  Good  Fund of Knowledge: Good  Language: Good  Akathisia:  No  Handed:  Right  AIMS (if indicated): not done  Assets:  Communication Skills Desire for Improvement Resilience Social Support Talents/Skills  ADL's: Normal  Cognition: WNL  Sleep:  Poor   Screenings:   Assessment and Plan: Patient is 67 year old female with a history of depression mood swings insomnia.  In the past she overuse medications and alcohol but she is not doing this now.  She is not sleeping well and some of this seems to be related to pain management and she will address this with her orthopedic physician.  However we can try switching from Ambien to Restoril 30 mg at bedtime for sleep.  The Prozac has helped her mood so she will continue on 20 mg  daily, as well as Depakote 150 mg at bedtime for mood stabilization and Xanax 0.5 mg twice a day and 1 mg at bedtime for anxiety.  She will return to see me in 3 months but call sooner if the sleeping medication does not work   [name], MD 09/21/2017, 11:20 AM

## 2017-09-24 ENCOUNTER — Telehealth (HOSPITAL_COMMUNITY): Payer: Self-pay | Admitting: *Deleted

## 2017-09-24 NOTE — Telephone Encounter (Signed)
She was told to stop [place] and try restoril instead

## 2017-09-24 NOTE — Telephone Encounter (Signed)
[facility] called in stating that patient had Ambien filled on 09/11/17. And is concerned if Restoril should be filled if order has been changed? Also that another provider is ordering Percocet just to make you aware.

## 2017-09-27 ENCOUNTER — Telehealth: Payer: Self-pay | Admitting: Orthopedic Surgery

## 2017-09-27 ENCOUNTER — Other Ambulatory Visit: Payer: Self-pay | Admitting: Orthopedic Surgery

## 2017-09-27 ENCOUNTER — Ambulatory Visit: Payer: Medicare HMO | Admitting: Gastroenterology

## 2017-09-27 MED ORDER — OXYCODONE-ACETAMINOPHEN 7.5-325 MG PO TABS: ORAL_TABLET | ORAL | 0 refills | Status: DC

## 2017-09-27 NOTE — Telephone Encounter (Signed)
done

## 2017-09-27 NOTE — Telephone Encounter (Signed)
Patient requests refill on Oxycodone/Acetaminophen 7.5-325  Mgs.   Qty 56  Sig - Route: Take 1 tablet by mouth every 6 (six) hours as needed for severe pain  [facility] [place]

## 2017-09-28 NOTE — Telephone Encounter (Signed)
Patient aware.

## 2017-10-04 DIAGNOSIS — G8929 Other chronic pain: Secondary | ICD-10-CM | POA: Diagnosis not present

## 2017-10-04 DIAGNOSIS — E782 Mixed hyperlipidemia: Secondary | ICD-10-CM | POA: Diagnosis not present

## 2017-10-04 DIAGNOSIS — G4733 Obstructive sleep apnea (adult) (pediatric): Secondary | ICD-10-CM | POA: Diagnosis not present

## 2017-10-04 DIAGNOSIS — Z6841 Body Mass Index (BMI) 40.0 and over, adult: Secondary | ICD-10-CM | POA: Diagnosis not present

## 2017-10-04 DIAGNOSIS — M179 Osteoarthritis of knee, unspecified: Secondary | ICD-10-CM | POA: Diagnosis not present

## 2017-10-04 DIAGNOSIS — I10 Essential (primary) hypertension: Secondary | ICD-10-CM | POA: Diagnosis not present

## 2017-10-04 DIAGNOSIS — E063 Autoimmune thyroiditis: Secondary | ICD-10-CM | POA: Diagnosis not present

## 2017-10-04 DIAGNOSIS — R69 Illness, unspecified: Secondary | ICD-10-CM | POA: Diagnosis not present

## 2017-10-04 DIAGNOSIS — G894 Chronic pain syndrome: Secondary | ICD-10-CM | POA: Diagnosis not present

## 2017-10-11 ENCOUNTER — Telehealth: Payer: Self-pay | Admitting: Orthopedic Surgery

## 2017-10-11 NOTE — Telephone Encounter (Signed)
Oxycodone-Acetaminophen  7.5/325 mg  Qty 56 Tablets  Take one tablet by mouth every 6 (six) hours as needed for severe pain.  [facility] [place]

## 2017-10-12 ENCOUNTER — Other Ambulatory Visit: Payer: Self-pay | Admitting: Orthopedic Surgery

## 2017-10-12 MED ORDER — OXYCODONE-ACETAMINOPHEN 7.5-325 MG PO TABS: ORAL_TABLET | ORAL | 0 refills | Status: DC

## 2017-10-12 NOTE — Progress Notes (Signed)
380 risk score

## 2017-10-24 ENCOUNTER — Telehealth: Payer: Self-pay | Admitting: Orthopedic Surgery

## 2017-10-24 MED ORDER — OXYCODONE-ACETAMINOPHEN 7.5-325 MG PO TABS: 1.0000 | ORAL_TABLET | Freq: Four times a day (QID) | ORAL | 0 refills | Status: DC | PRN

## 2017-10-24 NOTE — Telephone Encounter (Signed)
Patient of [facility] requests refill on Hydrocodone/Acetaminophen 7.5-325  Mgs.   Qty  56        Sig: TAKE ONE TABLET BY MOUTH EVERY 6 HOURS AS NEEDED FOR SEVERE PAIN     She states she wants [facility] to [facility] [place]

## 2017-11-06 ENCOUNTER — Other Ambulatory Visit: Payer: Self-pay | Admitting: Orthopedic Surgery

## 2017-11-06 ENCOUNTER — Telehealth: Payer: Self-pay | Admitting: Orthopedic Surgery

## 2017-11-06 DIAGNOSIS — G894 Chronic pain syndrome: Secondary | ICD-10-CM

## 2017-11-06 MED ORDER — OXYCODONE-ACETAMINOPHEN 7.5-325 MG PO TABS: 1.0000 | ORAL_TABLET | Freq: Four times a day (QID) | ORAL | 0 refills | Status: DC | PRN

## 2017-11-06 NOTE — Telephone Encounter (Addendum)
Oxycodone-Acetaminophen  7.5/325 mg  Qty 56 Tablets  Take 1 tablet by mouth every 6 (six) hours as needed for severe pain.  PATIENT USES [facility] [place]

## 2017-11-07 ENCOUNTER — Ambulatory Visit: Payer: Medicare HMO | Admitting: Gastroenterology

## 2017-11-07 ENCOUNTER — Encounter: Payer: Self-pay | Admitting: Gastroenterology

## 2017-11-07 VITALS — BP 158/80 | HR 82 | Temp 96.4°F | Ht 63.0 in | Wt 241.6 lb

## 2017-11-07 DIAGNOSIS — K5900 Constipation, unspecified: Secondary | ICD-10-CM | POA: Diagnosis not present

## 2017-11-07 NOTE — Assessment & Plan Note (Signed)
Linzess not helpful. She is on narcotics but only takes 1 pill at the most three times a week. Will resume Amitiza 24 mcg BID. Tonight will do Miralax prep, starting Amitiza tomorrow. Will request most recent labs from PCP to rule out any thyroid issues. Colonoscopy on file from 2016. No alarm symptoms. Progress report in 1 week and return in 3 months.

## 2017-11-07 NOTE — Progress Notes (Signed)
Referring Provider: Elfredia NevinsFusco, Lawrence, MD Primary Care Physician:  Elfredia NevinsFusco, Lawrence, MD Primary GI: Dr. Jena Gaussourk    Chief Complaint  Patient presents with  . Diarrhea  . Constipation    HPI:   Diane [name] is a 67 y.o. female presenting today with a history of GERD and constipation, last seen in Aug 2018. Remote history of adenomas. Last colonoscopy in 2016 and due again in 2021. Linzess 290 mcg prescribed at last visit as she was straining with Amitiza 24 mcg.   Will have some formed BMs then followed by diarrhea. BMs are only once a week. Taking Linzess every day but still very constipated. Takes oxycodone just as needed but not daily.     Past Medical History:  Diagnosis Date  . Allergic rhinitis   . Anxiety   . Arthritis   . Cataracts, bilateral   . Chronic back pain    Sees pain mamagement clinic  . Depression   . GERD (gastroesophageal reflux disease)   . Headaches, cluster 3 yrs ago   last one  . High cholesterol   . Hypertension   . Hypothyroid   . Immature cataract   . Mania (HCC)   . PTSD (post-traumatic stress disorder)   . Seizures (HCC) 20 yrs ago   unknown etiology-on meds  . Sleep apnea    STOP BANG score=4  . 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[facility](HCC)     Past Surgical History:  Procedure Laterality Date  . BACK SURGERY    . CATARACT EXTRACTION W/PHACO Left 01/04/2016   Procedure: CATARACT EXTRACTION PHACO AND INTRAOCULAR LENS PLACEMENT (IOC);  Surgeon: Jethro BolusMark Shapiro, MD;  Location: [facility];  Service: Ophthalmology;  Laterality: Left;  CDE:4.70  . CATARACT EXTRACTION W/PHACO Right 01/18/2016   Procedure: CATARACT EXTRACTION PHACO AND INTRAOCULAR LENS PLACEMENT RIGHT EYE; CDE:  4.66;  Surgeon: Jethro BolusMark Shapiro, MD;  Location: [facility];  Service: Ophthalmology;  Laterality: Right;  . CHOLECYSTECTOMY    . COLONOSCOPY  10/16/2008   NGE:[id number]:Hemorrhoids, otherwise normal rectum; pancolonic diverticula the remainder of the colonic mucosa appeared normal. Next TCS due 10/2013.  Marland Kitchen.  COLONOSCOPY  06/28/2006   RMR: Internal hemorrhoids. Diminutive rectal polyps, cold biopsied/ Pancolonic diverticula. Polyps in the right colon removed   . COLONOSCOPY WITH PROPOFOL N/A 05/20/2015   RMR: Pancolonic diverticulosis. Redundant colon   . ESOPHAGOGASTRODUODENOSCOPY  10/16/2008   NUU:[id number]:Small hiatal hernia otherwise normal esophagus, stomach  D1, D2, status post passage of a 56-French Maloney dilator  . ESOPHAGOGASTRODUODENOSCOPY  06/28/2006   UYQ:[id number]:Normal esophagus. Small hiatal hernia. Otherwise normal stomach, D1 and D2, status post passage of a 56 [place] Maloney dilator  . ESOPHAGOGASTRODUODENOSCOPY (EGD) WITH ESOPHAGEAL DILATION N/A 07/14/2013   RMR: Abnormal esophagus of uncertain significance-status post esophageal biospy. small hiatal hernia.   Marland Kitchen. ESOPHAGOGASTRODUODENOSCOPY (EGD) WITH PROPOFOL N/A 05/20/2015   RMR: NORMAL EGD status post maloney dilation   . FOOT SURGERY     left foot-  . INCISION AND DRAINAGE Right 01/03/2013   Procedure: INCISION AND DRAINAGE;  Surgeon: Vickki HearingStanley E Harrison, MD;  Location: [facility];  Service: Orthopedics;  Laterality: Right;  . KNEE ARTHROSCOPY  01/2012   right  . KNEE ARTHROSCOPY WITH LATERAL RELEASE Right 01/03/2013   Procedure: Lateral Release Patella Right Knee;  Surgeon: Vickki HearingStanley E Harrison, MD;  Location: [facility];  Service: Orthopedics;  Laterality: Right;  . left foot  x 2  . LUMBAR DISC SURGERY  L4-5  . MALONEY DILATION N/A 05/20/2015   Procedure:  MALONEY DILATION;  Surgeon: Corbin Ade, MD;  Location: [facility];  Service: Endoscopy;  Laterality: N/A;  Maloney dilator # 54  . oophoectomy  bilateral  . PARTIAL HYSTERECTOMY    . TOTAL KNEE ARTHROPLASTY  07/01/2012   Procedure: TOTAL KNEE ARTHROPLASTY;  Surgeon: Vickki Hearing, MD;  Location: [facility];  Service: Orthopedics;  Laterality: Right;  . TOTAL KNEE REVISION Right 01/03/2013   Procedure: Patellaplasty Right Knee;  Surgeon: Vickki Hearing, MD;  Location: [facility];  Service: Orthopedics;   Laterality: Right;    Current Outpatient Medications  Medication Sig Dispense Refill  . ALPRAZolam (XANAX) 0.5 MG tablet Take one twice a day and 2 at bedtime 120 tablet 2  . amLODipine (NORVASC) 5 MG tablet Take 5 mg by mouth daily.    Marland Kitchen atorvastatin (LIPITOR) 20 MG tablet Take 20 mg by mouth daily.  1  . cyclobenzaprine (FLEXERIL) 10 MG tablet TAKE ONE TABLET BY MOUTH THREE TIMES DAILY AS NEEDED FOR MUSCLE SPASMS. 90 tablet 5  . divalproex (DEPAKOTE) 250 MG DR tablet TAKE THREE (3) TABLETS BY MOUTH AT BEDTIME 90 tablet 2  . FLUoxetine (PROZAC) 20 MG capsule Take 1 capsule (20 mg total) by mouth daily. 30 capsule 2  . levothyroxine (SYNTHROID, LEVOTHROID) 150 MCG tablet Take 150 mcg by mouth daily.  5  . linaclotide (LINZESS) 290 MCG CAPS capsule Take 1 capsule (290 mcg total) by mouth daily before breakfast. 90 capsule 3  . lisinopril (PRINIVIL,ZESTRIL) 10 MG tablet Take 10 mg by mouth daily.    . meloxicam (MOBIC) 7.5 MG tablet TAKE ONE TABLET BY MOUTH DAILY. 30 tablet 5  . oxybutynin (DITROPAN-XL) 5 MG 24 hr tablet Take 5 mg by mouth at bedtime.    Marland Kitchen oxyCODONE-acetaminophen (PERCOCET) 7.5-325 MG tablet Take 1 tablet by mouth every 6 (six) hours as needed for severe pain. 56 tablet 0  . pantoprazole (PROTONIX) 40 MG tablet TAKE ONE TABLET BY MOUTH TWICE DAILY. 60 tablet 3  . temazepam (RESTORIL) 30 MG capsule Take 1 capsule (30 mg total) by mouth at bedtime as needed for sleep. 30 capsule 2   No current facility-administered medications for this visit.     Allergies as of 11/07/2017 - Review Complete 11/07/2017  Allergen Reaction Noted  . Neomycin-bacitracin zn-polymyx Other (See Comments) 02/27/2008  . Penicillins Hives   . Sulfonamide derivatives Hives and Swelling 02/27/2008    Family History  Problem Relation Age of Onset  . Alcohol abuse Mother   . Anxiety disorder Mother   . Alcohol abuse Father   . Anxiety disorder Father   . Bipolar disorder Sister   . Alcohol abuse  Brother   . Anxiety disorder Brother   . Drug abuse Brother   . Dementia Paternal Aunt   . ADD / ADHD Grandchild   . ADD / ADHD Grandchild   . Drug abuse Brother   . Alcohol abuse Brother   . Anxiety disorder Brother   . Seizures Brother   . Alcohol abuse Brother   . Anxiety disorder Brother   . Seizures Brother   . Alcohol abuse Brother   . Anxiety disorder Brother   . Alcohol abuse Brother   . Anxiety disorder Brother   . Heart disease Unknown   . Diabetes Unknown   . Alcohol abuse Unknown   . Depression Neg Hx   . OCD Neg Hx   . Paranoid behavior Neg Hx   . Schizophrenia Neg Hx   .  Sexual abuse Neg Hx   . Physical abuse Neg Hx   . Colon cancer Neg Hx     Social History   Socioeconomic History  . Marital status: Married    Spouse name: None  . Number of children: None  . Years of education: 12th grade  . Highest education level: None  Social Needs  . Financial resource strain: None  . Food insecurity - worry: None  . Food insecurity - inability: None  . Transportation needs - medical: None  . Transportation needs - non-medical: None  Occupational History  . Occupation: disabled    Associate Professor: UNEMPLOYED  Tobacco Use  . Smoking status: Former Smoker    Packs/day: 0.25    Years: 5.00    Pack years: 1.25    Types: Cigarettes    Last attempt to quit: 06/26/2003    Years since quitting: 14.3  . Smokeless tobacco: Never Used  Substance and Sexual Activity  . Alcohol use: Yes    Alcohol/week: 0.0 oz    Comment: occasionally; once a month.  . Drug use: No    Comment: has a past history of street drug use  . Sexual activity: No    Birth control/protection: Surgical  Other Topics Concern  . None  Social History Narrative  . None    Review of Systems: Gen: Denies fever, chills, anorexia. Denies fatigue, weakness, weight loss.  CV: Denies chest pain, palpitations, syncope, peripheral edema, and claudication. Resp: Denies dyspnea at rest, cough, wheezing,  coughing up blood, and pleurisy. GI: see HPI  Derm: Denies rash, itching, dry skin Psych: Denies depression, anxiety, memory loss, confusion. No homicidal or suicidal ideation.  Heme: Denies bruising, bleeding, and enlarged lymph nodes.  Physical Exam: BP (!) 158/80   Pulse 82   Temp (!) 96.4 F (35.8 C) (Oral)   Ht 5\' 3"  (1.6 m)   Wt 241 lb 9.6 oz (109.6 kg)   BMI 42.80 kg/m  General:   Alert and oriented. No distress noted. Pleasant and cooperative.  Head:  Normocephalic and atraumatic. Eyes:  Conjuctiva clear without scleral icterus. Mouth:  Oral mucosa pink and moist.  Abdomen:  +BS, soft, non-tender and non-distended. No rebound or guarding. No HSM or masses noted. Msk:  Symmetrical without gross deformities. Normal posture. Extremities:  Without edema. Neurologic:  Alert and  oriented x4 Psych:  Alert and cooperative. Normal mood and affect.

## 2017-11-07 NOTE — Patient Instructions (Signed)
I would like for you to do the Miralax prep this evening to help get you cleaned out:  1. Take 1 capful of Miralax in a cup of water, followed by 8 ounces of water 30 minutes later. Repeat this up to 5 times or until you start having bowel movements.   2. Tomorrow, start taking Amitiza again, twice a day with food. If needed, add Miralax once daily.  3. Call me next week with how you are doing!  We will see you in 3 months to make sure everything is ok, but in the meantime we need to get things moving, so please keep us updated.   It was a pleasure to see you today. I strive to create trusting relationships with patients to provide genuine, compassionate, and quality care. I value your feedback. If you receive a survey regarding your visit,  I greatly appreciate you taking time to fill this out.   Gelene MinkAnna W. Yaire Kreher, PhD, ANP-BC [facility]

## 2017-11-08 NOTE — Progress Notes (Signed)
cc'ed to pcp °

## 2017-11-20 ENCOUNTER — Other Ambulatory Visit: Payer: Self-pay | Admitting: Orthopedic Surgery

## 2017-11-20 DIAGNOSIS — G894 Chronic pain syndrome: Secondary | ICD-10-CM

## 2017-11-20 MED ORDER — OXYCODONE-ACETAMINOPHEN 7.5-325 MG PO TABS: 1.0000 | ORAL_TABLET | Freq: Four times a day (QID) | ORAL | 0 refills | Status: DC | PRN

## 2017-11-20 NOTE — Telephone Encounter (Signed)
Patient requests refill on Oxycodone/Acetaminophen (Percocet)  7.5-325  Mgs.   Qty  56  Sig: Take 1 tablet by mouth every 6 (six) hours as needed for severe pain.  Patient states she uses [name] in [place]

## 2017-11-26 ENCOUNTER — Other Ambulatory Visit: Payer: Self-pay | Admitting: Gastroenterology

## 2017-11-26 ENCOUNTER — Other Ambulatory Visit (HOSPITAL_COMMUNITY): Payer: Self-pay | Admitting: Psychiatry

## 2017-11-26 DIAGNOSIS — F5105 Insomnia due to other mental disorder: Secondary | ICD-10-CM

## 2017-11-26 DIAGNOSIS — K219 Gastro-esophageal reflux disease without esophagitis: Secondary | ICD-10-CM

## 2017-11-27 DIAGNOSIS — Z96659 Presence of unspecified artificial knee joint: Secondary | ICD-10-CM | POA: Diagnosis not present

## 2017-11-27 DIAGNOSIS — G4733 Obstructive sleep apnea (adult) (pediatric): Secondary | ICD-10-CM | POA: Diagnosis not present

## 2017-12-03 ENCOUNTER — Other Ambulatory Visit: Payer: Self-pay | Admitting: Orthopedic Surgery

## 2017-12-03 ENCOUNTER — Other Ambulatory Visit (HOSPITAL_COMMUNITY): Payer: Self-pay | Admitting: Psychiatry

## 2017-12-03 ENCOUNTER — Other Ambulatory Visit (HOSPITAL_COMMUNITY): Payer: Self-pay | Admitting: *Deleted

## 2017-12-03 DIAGNOSIS — G894 Chronic pain syndrome: Secondary | ICD-10-CM

## 2017-12-03 MED ORDER — OXYCODONE-ACETAMINOPHEN 7.5-325 MG PO TABS: 1.0000 | ORAL_TABLET | Freq: Four times a day (QID) | ORAL | 0 refills | Status: DC | PRN

## 2017-12-03 NOTE — Telephone Encounter (Signed)
Patient requests refill on Oxycodone/Acetaminophen 7.5-325 mgs.   Qty  56   Sig: Take 1 tablet by mouth every 6 (six) hours as needed for severe pain.   Patient states she uses [facility] in [place]

## 2017-12-05 ENCOUNTER — Other Ambulatory Visit (HOSPITAL_COMMUNITY): Payer: Self-pay | Admitting: Psychiatry

## 2017-12-06 ENCOUNTER — Other Ambulatory Visit (HOSPITAL_COMMUNITY): Payer: Self-pay | Admitting: Psychiatry

## 2017-12-07 DIAGNOSIS — G4733 Obstructive sleep apnea (adult) (pediatric): Secondary | ICD-10-CM | POA: Diagnosis not present

## 2017-12-07 DIAGNOSIS — Z96659 Presence of unspecified artificial knee joint: Secondary | ICD-10-CM | POA: Diagnosis not present

## 2017-12-18 ENCOUNTER — Other Ambulatory Visit: Payer: Self-pay | Admitting: Orthopedic Surgery

## 2017-12-18 DIAGNOSIS — G894 Chronic pain syndrome: Secondary | ICD-10-CM

## 2017-12-18 NOTE — Telephone Encounter (Signed)
Patient requests refill on Oxycodone/Acetaminophen 7.5-325  Mgs.   Qty  56       Sig: Take 1 tablet by mouth every 6 (six) hours as needed for severe pain.    Patient states she uses [facility] in [place]

## 2017-12-19 MED ORDER — OXYCODONE-ACETAMINOPHEN 7.5-325 MG PO TABS: 1.0000 | ORAL_TABLET | Freq: Four times a day (QID) | ORAL | 0 refills | Status: DC | PRN

## 2017-12-20 ENCOUNTER — Ambulatory Visit (HOSPITAL_COMMUNITY): Payer: Medicare HMO | Admitting: Psychiatry

## 2017-12-20 ENCOUNTER — Encounter (HOSPITAL_COMMUNITY): Payer: Self-pay | Admitting: Psychiatry

## 2017-12-20 VITALS — BP 113/56 | HR 79 | Ht 63.0 in | Wt 246.0 lb

## 2017-12-20 DIAGNOSIS — F5105 Insomnia due to other mental disorder: Secondary | ICD-10-CM

## 2017-12-20 DIAGNOSIS — Z736 Limitation of activities due to disability: Secondary | ICD-10-CM

## 2017-12-20 DIAGNOSIS — R69 Illness, unspecified: Secondary | ICD-10-CM | POA: Diagnosis not present

## 2017-12-20 DIAGNOSIS — F3130 Bipolar disorder, current episode depressed, mild or moderate severity, unspecified: Secondary | ICD-10-CM

## 2017-12-20 DIAGNOSIS — Z813 Family history of other psychoactive substance abuse and dependence: Secondary | ICD-10-CM | POA: Diagnosis not present

## 2017-12-20 DIAGNOSIS — Z87891 Personal history of nicotine dependence: Secondary | ICD-10-CM | POA: Diagnosis not present

## 2017-12-20 DIAGNOSIS — G4700 Insomnia, unspecified: Secondary | ICD-10-CM | POA: Diagnosis not present

## 2017-12-20 DIAGNOSIS — Z560 Unemployment, unspecified: Secondary | ICD-10-CM | POA: Diagnosis not present

## 2017-12-20 DIAGNOSIS — Z818 Family history of other mental and behavioral disorders: Secondary | ICD-10-CM | POA: Diagnosis not present

## 2017-12-20 DIAGNOSIS — M2550 Pain in unspecified joint: Secondary | ICD-10-CM | POA: Diagnosis not present

## 2017-12-20 DIAGNOSIS — Z810 Family history of intellectual disabilities: Secondary | ICD-10-CM | POA: Diagnosis not present

## 2017-12-20 DIAGNOSIS — Z811 Family history of alcohol abuse and dependence: Secondary | ICD-10-CM | POA: Diagnosis not present

## 2017-12-20 MED ORDER — ALPRAZOLAM 0.5 MG PO TABS: ORAL_TABLET | ORAL | 2 refills | Status: DC

## 2017-12-20 MED ORDER — TEMAZEPAM 30 MG PO CAPS: 30.0000 mg | ORAL_CAPSULE | Freq: Every evening | ORAL | 2 refills | Status: DC | PRN

## 2017-12-20 MED ORDER — DIVALPROEX SODIUM 250 MG PO DR TAB: DELAYED_RELEASE_TABLET | ORAL | 2 refills | Status: DC

## 2017-12-20 MED ORDER — FLUOXETINE HCL 20 MG PO CAPS: 20.0000 mg | ORAL_CAPSULE | Freq: Every day | ORAL | 2 refills | Status: DC

## 2017-12-20 NOTE — Progress Notes (Signed)
BH MD/PA/NP OP Progress Note  12/20/2017 2:47 PM [name]  MRN:  [id number]  Chief Complaint:  Chief Complaint    Depression; Anxiety; Follow-up     HPI: This patient is a 67 year old black female with a history of bipolar disorder who comes in for follow-up today.  She is on disability and living with her boyfriend in [place].  The patient returns after 3 months.  Overall she is doing fairly well.  She denies being seriously depressed or suicidal.  She is sleeping better with the Restoril but it still takes 1 or 2 hours to get to sleep.  She admits that most days however she takes at least an hour nap during the day.  I explained that this could cut into her nighttime sleep.  She also has a lot of chronic pain and this bothers her at night.  She is going to be getting a car so she can get out more during the day while her boyfriend sleeping.  He works at night.  People have offered to come get her but she claims "I never call them."  I encouraged her to become more social. Visit Diagnosis:    ICD-10-CM   1. Bipolar I disorder, most recent episode depressed (HCC) F31.30   2. Insomnia due to mental disorder F51.05 ALPRAZolam (XANAX) 0.5 MG tablet    Past Psychiatric History: Patient has a history of [facility]  At that time she overdosed on pills and was intoxicated with alcohol.  She has had outpatient treatment ever since  Past Medical History:  Past Medical History:  Diagnosis Date  . Allergic rhinitis   . Anxiety   . Arthritis   . Cataracts, bilateral   . Chronic back pain    Sees pain mamagement clinic  . Depression   . GERD (gastroesophageal reflux disease)   . Headaches, cluster 3 yrs ago   last one  . High cholesterol   . Hypertension   . Hypothyroid   . Immature cataract   . Mania (HCC)   . PTSD (post-traumatic stress disorder)   . Seizures (HCC) 20 yrs ago   unknown etiology-on meds  . Sleep apnea    STOP  BANG score=4  . 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[facility](HCC)     Past Surgical History:  Procedure Laterality Date  . BACK SURGERY    . CATARACT EXTRACTION W/PHACO Left 01/04/2016   Procedure: CATARACT EXTRACTION PHACO AND INTRAOCULAR LENS PLACEMENT (IOC);  Surgeon: Jethro BolusMark Shapiro, MD;  Location: [facility];  Service: Ophthalmology;  Laterality: Left;  CDE:4.70  . CATARACT EXTRACTION W/PHACO Right 01/18/2016   Procedure: CATARACT EXTRACTION PHACO AND INTRAOCULAR LENS PLACEMENT RIGHT EYE; CDE:  4.66;  Surgeon: Jethro BolusMark Shapiro, MD;  Location: [facility];  Service: Ophthalmology;  Laterality: Right;  . CHOLECYSTECTOMY    . COLONOSCOPY  10/16/2008   WUJ:[id number]:Hemorrhoids, otherwise normal rectum; pancolonic diverticula the remainder of the colonic mucosa appeared normal. Next TCS due 10/2013.  Marland Kitchen. COLONOSCOPY  06/28/2006   RMR: Internal hemorrhoids. Diminutive rectal polyps, cold biopsied/ Pancolonic diverticula. Polyps in the right colon removed   . COLONOSCOPY WITH PROPOFOL N/A 05/20/2015   RMR: Pancolonic diverticulosis. Redundant colon   . ESOPHAGOGASTRODUODENOSCOPY  10/16/2008   AOZ:HYQMVRMR:Small hiatal hernia otherwise normal esophagus, stomach  D1, D2, status post passage of a 56-French Maloney dilator  . ESOPHAGOGASTRODUODENOSCOPY  06/28/2006   HQI:ONGEXBRMR:Normal esophagus. Small hiatal hernia. Otherwise normal stomach, D1 and D2, status post passage  of a 52 [place] Maloney dilator  . ESOPHAGOGASTRODUODENOSCOPY (EGD) WITH ESOPHAGEAL DILATION N/A 07/14/2013   RMR: Abnormal esophagus of uncertain significance-status post esophageal biospy. small hiatal hernia.   Marland Kitchen ESOPHAGOGASTRODUODENOSCOPY (EGD) WITH PROPOFOL N/A 05/20/2015   RMR: NORMAL EGD status post maloney dilation   . FOOT SURGERY     left foot-  . INCISION AND DRAINAGE Right 01/03/2013   Procedure: INCISION AND DRAINAGE;  Surgeon: Vickki Hearing, MD;  Location: [facility];  Service: Orthopedics;  Laterality: Right;  . KNEE ARTHROSCOPY  01/2012   right  . KNEE ARTHROSCOPY WITH LATERAL  RELEASE Right 01/03/2013   Procedure: Lateral Release Patella Right Knee;  Surgeon: Vickki Hearing, MD;  Location: [facility];  Service: Orthopedics;  Laterality: Right;  . left foot  x 2  . LUMBAR DISC SURGERY  L4-5  . MALONEY DILATION N/A 05/20/2015   Procedure: Elease Hashimoto DILATION;  Surgeon: Corbin Ade, MD;  Location: [facility];  Service: Endoscopy;  Laterality: N/A;  Maloney dilator # 54  . oophoectomy  bilateral  . PARTIAL HYSTERECTOMY    . TOTAL KNEE ARTHROPLASTY  07/01/2012   Procedure: TOTAL KNEE ARTHROPLASTY;  Surgeon: Vickki Hearing, MD;  Location: [facility];  Service: Orthopedics;  Laterality: Right;  . TOTAL KNEE REVISION Right 01/03/2013   Procedure: Patellaplasty Right Knee;  Surgeon: Vickki Hearing, MD;  Location: [facility];  Service: Orthopedics;  Laterality: Right;    Family Psychiatric History: See below  Family History:  Family History  Problem Relation Age of Onset  . Alcohol abuse Mother   . Anxiety disorder Mother   . Alcohol abuse Father   . Anxiety disorder Father   . Bipolar disorder Sister   . Alcohol abuse Brother   . Anxiety disorder Brother   . Drug abuse Brother   . Dementia Paternal Aunt   . ADD / ADHD Grandchild   . ADD / ADHD Grandchild   . Drug abuse Brother   . Alcohol abuse Brother   . Anxiety disorder Brother   . Seizures Brother   . Alcohol abuse Brother   . Anxiety disorder Brother   . Seizures Brother   . Alcohol abuse Brother   . Anxiety disorder Brother   . Alcohol abuse Brother   . Anxiety disorder Brother   . Heart disease Unknown   . Diabetes Unknown   . Alcohol abuse Unknown   . Depression Neg Hx   . OCD Neg Hx   . Paranoid behavior Neg Hx   . Schizophrenia Neg Hx   . Sexual abuse Neg Hx   . Physical abuse Neg Hx   . Colon cancer Neg Hx     Social History:  Social History   Socioeconomic History  . Marital status: Married    Spouse name: Not on file  . Number of children: Not on file  . Years of education: 12th  grade  . Highest education level: Not on file  Occupational History  . Occupation: disabled    Associate Professor: UNEMPLOYED  Social Needs  . Financial resource strain: Not on file  . Food insecurity:    Worry: Not on file    Inability: Not on file  . Transportation needs:    Medical: Not on file    Non-medical: Not on file  Tobacco Use  . Smoking status: Former Smoker    Packs/day: 0.25    Years: 5.00    Pack years: 1.25    Types: Cigarettes  Last attempt to quit: 06/26/2003    Years since quitting: 14.4  . Smokeless tobacco: Never Used  Substance and Sexual Activity  . Alcohol use: Yes    Alcohol/week: 0.0 oz    Comment: occasionally; once a month.  . Drug use: No    Comment: has a past history of street drug use  . Sexual activity: Never    Birth control/protection: Surgical  Lifestyle  . Physical activity:    Days per week: Not on file    Minutes per session: Not on file  . Stress: Not on file  Relationships  . Social connections:    Talks on phone: Not on file    Gets together: Not on file    Attends religious service: Not on file    Active member of club or organization: Not on file    Attends meetings of clubs or organizations: Not on file    Relationship status: Not on file  Other Topics Concern  . Not on file  Social History Narrative  . Not on file    Allergies: penicllin  Metabolic Disorder Labs: Lab Results  Component Value Date   HGBA1C 5.8 (H) 01/25/2012   MPG 120 (H) 01/25/2012   No results found for: PROLACTIN Lab Results  Component Value Date   CHOL  12/10/2010    69        ATP III CLASSIFICATION:  <200     mg/dL   Desirable  295-621  mg/dL   Borderline High  >=308    mg/dL   High          TRIG 38 12/10/2010   HDL 28 (L) 12/10/2010   CHOLHDL 2.5 12/10/2010   VLDL 8 12/10/2010   LDLCALC  12/10/2010    33        Total Cholesterol/HDL:CHD Risk Coronary Heart Disease Risk Table                     Men   Women  1/2 Average Risk   3.4    3.3  Average Risk       5.0   4.4  2 X Average Risk   9.6   7.1  3 X Average Risk  23.4   11.0        Use the calculated Patient Ratio above and the CHD Risk Table to determine the patient's CHD Risk.        ATP III CLASSIFICATION (LDL):  <100     mg/dL   Optimal  657-846  mg/dL   Near or Above                    Optimal  130-159  mg/dL   Borderline  962-952  mg/dL   High  >841     mg/dL   Very High   LDLCALC 71 07/12/2009   Lab Results  Component Value Date   TSH <0.008 (L) 12/09/2010   TSH 0.434 07/12/2009    Therapeutic Level Labs: No results found for: LITHIUM Lab Results  Component Value Date   VALPROATE 71.5 01/30/2017   VALPROATE 24.0 (L) 07/10/2014   No components found for:  CBMZ  Current Medications: Current Outpatient Medications  Medication Sig Dispense Refill  . ALPRAZolam (XANAX) 0.5 MG tablet Take one twice a day and 2 at bedtime 120 tablet 2  . amLODipine (NORVASC) 5 MG tablet Take 5 mg by mouth daily.    Marland Kitchen atorvastatin (LIPITOR) 20 MG tablet  Take 20 mg by mouth daily.  1  . cyclobenzaprine (FLEXERIL) 10 MG tablet TAKE ONE TABLET BY MOUTH THREE TIMES DAILY AS NEEDED FOR MUSCLE SPASMS. 90 tablet 5  . divalproex (DEPAKOTE) 250 MG DR tablet TAKE THREE (3) TABLETS BY MOUTH AT BEDTIME 90 tablet 2  . FLUoxetine (PROZAC) 20 MG capsule Take 1 capsule (20 mg total) by mouth daily. 30 capsule 2  . levothyroxine (SYNTHROID, LEVOTHROID) 150 MCG tablet Take 150 mcg by mouth daily.  5  . linaclotide (LINZESS) 290 MCG CAPS capsule Take 1 capsule (290 mcg total) by mouth daily before breakfast. 90 capsule 3  . lisinopril (PRINIVIL,ZESTRIL) 10 MG tablet Take 10 mg by mouth daily.    . meloxicam (MOBIC) 7.5 MG tablet TAKE ONE TABLET BY MOUTH DAILY. 30 tablet 5  . oxybutynin (DITROPAN-XL) 5 MG 24 hr tablet Take 5 mg by mouth at bedtime.    Marland Kitchen oxyCODONE-acetaminophen (PERCOCET) 7.5-325 MG tablet Take 1 tablet by mouth every 6 (six) hours as needed for severe pain. 56 tablet  0  . pantoprazole (PROTONIX) 40 MG tablet TAKE ONE TABLET BY MOUTH TWICE DAILY. 60 tablet 3  . temazepam (RESTORIL) 30 MG capsule Take 1 capsule (30 mg total) by mouth at bedtime as needed for sleep. 30 capsule 2   No current facility-administered medications for this visit.      Musculoskeletal: Strength & Muscle Tone: within normal limits Gait & Station: normal Patient leans: N/A  Psychiatric Specialty Exam: Review of Systems  Musculoskeletal: Positive for joint pain.  Psychiatric/Behavioral: The patient has insomnia.   All other systems reviewed and are negative.   Blood pressure (!) 113/56, pulse 79, height 5\' 3"  (1.6 m), weight 246 lb (111.6 kg), SpO2 96 %.Body mass index is 43.58 kg/m.  General Appearance: Casual, Neat and Well Groomed  Eye Contact:  Good  Speech:  Clear and Coherent  Volume:  Normal  Mood:  Euthymic  Affect:  Congruent  Thought Process:  Goal Directed  Orientation:  Full (Time, Place, and Person)  Thought Content: WDL   Suicidal Thoughts:  No  Homicidal Thoughts:  No  Memory:  Immediate;   Good Recent;   Good Remote;   Fair  Judgement:  Fair  Insight:  Lacking  Psychomotor Activity:  Decreased  Concentration:  Concentration: Fair and Attention Span: Fair  Recall:  Good  Fund of Knowledge: Good  Language: Good  Akathisia:  No  Handed:  Right  AIMS (if indicated): not done  Assets:  Communication Skills Desire for Improvement Resilience Social Support Talents/Skills  ADL's:  Intact  Cognition: WNL  Sleep:  Fair   Screenings:   Assessment and Plan: This patient is a 67 year old female with a history of depression and anxiety.  She is doing fairly well for the most part but needs to become more active so she can sleep better at night.  She is hoping that when she gets a vehicle she will be able to do so.  She will continue Depakote 750 mg at bedtime for mood stabilization, 20 mg daily for depression, Restoril 30 mg at bedtime for sleep and  Xanax 0.5 mg twice a day and 1 mg at bedtime for anxiety and sleep.  She will return to see me in 3 months   [name], MD 12/20/2017, 2:47 PM

## 2017-12-28 DIAGNOSIS — G4733 Obstructive sleep apnea (adult) (pediatric): Secondary | ICD-10-CM | POA: Diagnosis not present

## 2017-12-28 DIAGNOSIS — Z96659 Presence of unspecified artificial knee joint: Secondary | ICD-10-CM | POA: Diagnosis not present

## 2018-01-02 ENCOUNTER — Other Ambulatory Visit: Payer: Self-pay | Admitting: Orthopedic Surgery

## 2018-01-02 DIAGNOSIS — G894 Chronic pain syndrome: Secondary | ICD-10-CM

## 2018-01-02 NOTE — Telephone Encounter (Signed)
Patient requests refill requests on Oxycodone/Acetaminohen 7.5-325 mgs.  Qty  56  Sig: Take 1 tablet by mouth every 6 (six) hours as needed for severe pain.  [name] [place]

## 2018-01-04 ENCOUNTER — Other Ambulatory Visit (HOSPITAL_COMMUNITY): Payer: Self-pay | Admitting: Psychiatry

## 2018-01-04 MED ORDER — OXYCODONE-ACETAMINOPHEN 7.5-325 MG PO TABS: 1.0000 | ORAL_TABLET | Freq: Four times a day (QID) | ORAL | 0 refills | Status: DC | PRN

## 2018-01-16 ENCOUNTER — Other Ambulatory Visit: Payer: Self-pay | Admitting: Orthopedic Surgery

## 2018-01-16 DIAGNOSIS — G894 Chronic pain syndrome: Secondary | ICD-10-CM

## 2018-01-16 MED ORDER — OXYCODONE-ACETAMINOPHEN 7.5-325 MG PO TABS: 1.0000 | ORAL_TABLET | Freq: Four times a day (QID) | ORAL | 0 refills | Status: DC | PRN

## 2018-01-16 NOTE — Telephone Encounter (Signed)
Oxycodone-Acetaminophen  7.5/325 mg  Qty 56 Tablets  Take 1 tablet by mouth every 6 (six) hours as needed for severe pain   PATIENT USES [name] IN [place]

## 2018-01-27 DIAGNOSIS — Z96659 Presence of unspecified artificial knee joint: Secondary | ICD-10-CM | POA: Diagnosis not present

## 2018-01-27 DIAGNOSIS — G4733 Obstructive sleep apnea (adult) (pediatric): Secondary | ICD-10-CM | POA: Diagnosis not present

## 2018-01-31 ENCOUNTER — Other Ambulatory Visit: Payer: Self-pay | Admitting: Orthopedic Surgery

## 2018-01-31 DIAGNOSIS — G894 Chronic pain syndrome: Secondary | ICD-10-CM

## 2018-01-31 NOTE — Telephone Encounter (Signed)
Oxycodone-Acetaminophen 7.5/325 mg  Qty 56 Tablets  Take 1 tablet by mouth every 6 (six) hours as needed for severe pain.  [facility]ES [name]

## 2018-02-01 MED ORDER — OXYCODONE-ACETAMINOPHEN 7.5-325 MG PO TABS: 1.0000 | ORAL_TABLET | Freq: Four times a day (QID) | ORAL | 0 refills | Status: DC | PRN

## 2018-02-06 ENCOUNTER — Encounter: Payer: Self-pay | Admitting: Gastroenterology

## 2018-02-06 ENCOUNTER — Ambulatory Visit: Payer: Medicare HMO | Admitting: Gastroenterology

## 2018-02-06 VITALS — BP 150/86 | HR 73 | Temp 97.8°F | Ht 64.0 in | Wt 253.4 lb

## 2018-02-06 DIAGNOSIS — K5900 Constipation, unspecified: Secondary | ICD-10-CM

## 2018-02-06 MED ORDER — HYDROCORTISONE 2.5 % RE CREA: 1.0000 "application " | TOPICAL_CREAM | Freq: Two times a day (BID) | RECTAL | 1 refills | Status: AC

## 2018-02-06 NOTE — Patient Instructions (Signed)
Keep taking the Linzess every day.   I would like for you to try the Symproic, but only take this on the days you take the pain medicine.  Please have blood work done today.  Please call me early next week. If you are not better, I will be ordering a scan, and we will try a different medication.   It was a pleasure to see you today. I strive to create trusting relationships with patients to provide genuine, compassionate, and quality care. I value your feedback. If you receive a survey regarding your visit,  I greatly appreciate you taking time to fill this out.   Gelene Mink, PhD, ANP-BC [facility]

## 2018-02-06 NOTE — Progress Notes (Signed)
Primary Care Physician:  Elfredia Nevins, MD  Primary GI: Dr. Jena Gauss   Chief Complaint  Patient presents with  . Constipation    f/u. BM's are about once a week    HPI:   Diane [name] is a 67 y.o. female presenting today with a history of GERD and constipation, last seen in Feb 2019. Remote history of adenomas, and her last colonoscopy was in 2016. Due again in 2021. Linzess 290 mcg previously tried but still with constipation. Resumed Amitiza 24 mcg at last visit.   Outside labs from July 2018 with TSH 0.029.   One good BM once out of a month. Aug 2018 started noticing worsening constipation. Still taking Linzess 290 mcg but taking BID. Oxycodone 3 times a week. Continues to gain weight. Only medication change is Prozac, started around Nov 2018?Marland Kitchen Feels like this is when she started gaining weight.   Past Medical History:  Diagnosis Date  . Allergic rhinitis   . Anxiety   . Arthritis   . Cataracts, bilateral   . Chronic back pain    Sees pain mamagement clinic  . Depression   . GERD (gastroesophageal reflux disease)   . Headaches, cluster 3 yrs ago   last one  . High cholesterol   . Hypertension   . Hypothyroid   . Immature cataract   . Mania (HCC)   . PTSD (post-traumatic stress disorder)   . Seizures (HCC) 20 yrs ago   unknown etiology-on meds  . Sleep apnea    STOP BANG score=4  . 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[facility])     Past Surgical History:  Procedure Laterality Date  . BACK SURGERY    . CATARACT EXTRACTION W/PHACO Left 01/04/2016   Procedure: CATARACT EXTRACTION PHACO AND INTRAOCULAR LENS PLACEMENT (IOC);  Surgeon: Jethro Bolus, MD;  Location: [facility];  Service: Ophthalmology;  Laterality: Left;  CDE:4.70  . CATARACT EXTRACTION W/PHACO Right 01/18/2016   Procedure: CATARACT EXTRACTION PHACO AND INTRAOCULAR LENS PLACEMENT RIGHT EYE; CDE:  4.66;  Surgeon: Jethro Bolus, MD;  Location: [facility];  Service: Ophthalmology;  Laterality: Right;  . CHOLECYSTECTOMY    .  COLONOSCOPY  10/16/2008   ZOX:[id number], otherwise normal rectum; pancolonic diverticula the remainder of the colonic mucosa appeared normal. Next TCS due 10/2013.  Marland Kitchen COLONOSCOPY  06/28/2006   RMR: Internal hemorrhoids. Diminutive rectal polyps, cold biopsied/ Pancolonic diverticula. Polyps in the right colon removed   . COLONOSCOPY WITH PROPOFOL N/A 05/20/2015   RMR: Pancolonic diverticulosis. Redundant colon   . ESOPHAGOGASTRODUODENOSCOPY  10/16/2008   XBJ:YNWGN hiatal hernia otherwise normal esophagus, stomach  D1, D2, status post passage of a 56-French Maloney dilator  . ESOPHAGOGASTRODUODENOSCOPY  06/28/2006   FAO:ZHYQMV esophagus. Small hiatal hernia. Otherwise normal stomach, D1 and D2, status post passage of a 56 [place] Maloney dilator  . ESOPHAGOGASTRODUODENOSCOPY (EGD) WITH ESOPHAGEAL DILATION N/A 07/14/2013   RMR: Abnormal esophagus of uncertain significance-status post esophageal biospy. small hiatal hernia.   Marland Kitchen ESOPHAGOGASTRODUODENOSCOPY (EGD) WITH PROPOFOL N/A 05/20/2015   RMR: NORMAL EGD status post maloney dilation   . FOOT SURGERY     left foot-  . INCISION AND DRAINAGE Right 01/03/2013   Procedure: INCISION AND DRAINAGE;  Surgeon: Vickki Hearing, MD;  Location: [facility];  Service: Orthopedics;  Laterality: Right;  . KNEE ARTHROSCOPY  01/2012   right  . KNEE ARTHROSCOPY WITH LATERAL RELEASE Right 01/03/2013   Procedure: Lateral Release Patella Right Knee;  Surgeon: Vickki Hearing, MD;  Location: [facility];  Service: Orthopedics;  Laterality: Right;  . left foot  x 2  . LUMBAR DISC SURGERY  L4-5  . MALONEY DILATION N/A 05/20/2015   Procedure: Elease Hashimoto DILATION;  Surgeon: Corbin Ade, MD;  Location: [facility];  Service: Endoscopy;  Laterality: N/A;  Maloney dilator # 54  . oophoectomy  bilateral  . PARTIAL HYSTERECTOMY    . TOTAL KNEE ARTHROPLASTY  07/01/2012   Procedure: TOTAL KNEE ARTHROPLASTY;  Surgeon: Vickki Hearing, MD;  Location: [facility];  Service: Orthopedics;   Laterality: Right;  . TOTAL KNEE REVISION Right 01/03/2013   Procedure: Patellaplasty Right Knee;  Surgeon: Vickki Hearing, MD;  Location: [facility];  Service: Orthopedics;  Laterality: Right;    Current Outpatient Medications  Medication Sig Dispense Refill  . ALPRAZolam (XANAX) 0.5 MG tablet Take one twice a day and 2 at bedtime 120 tablet 2  . amLODipine (NORVASC) 5 MG tablet Take 5 mg by mouth daily.    Marland Kitchen atorvastatin (LIPITOR) 20 MG tablet Take 20 mg by mouth daily.  1  . cyclobenzaprine (FLEXERIL) 10 MG tablet TAKE ONE TABLET BY MOUTH THREE TIMES DAILY AS NEEDED FOR MUSCLE SPASMS. 90 tablet 5  . divalproex (DEPAKOTE) 250 MG DR tablet TAKE THREE (3) TABLETS BY MOUTH AT BEDTIME 90 tablet 2  . FLUoxetine (PROZAC) 20 MG capsule Take 1 capsule (20 mg total) by mouth daily. 30 capsule 2  . levothyroxine (SYNTHROID, LEVOTHROID) 150 MCG tablet Take 150 mcg by mouth daily.  5  . linaclotide (LINZESS) 290 MCG CAPS capsule Take 1 capsule (290 mcg total) by mouth daily before breakfast. 90 capsule 3  . lisinopril (PRINIVIL,ZESTRIL) 10 MG tablet Take 10 mg by mouth daily.    . meloxicam (MOBIC) 7.5 MG tablet TAKE ONE TABLET BY MOUTH DAILY. 30 tablet 5  . oxybutynin (DITROPAN-XL) 5 MG 24 hr tablet Take 5 mg by mouth at bedtime.    Marland Kitchen oxyCODONE-acetaminophen (PERCOCET) 7.5-325 MG tablet Take 1 tablet by mouth every 6 (six) hours as needed for severe pain. 56 tablet 0  . pantoprazole (PROTONIX) 40 MG tablet TAKE ONE TABLET BY MOUTH TWICE DAILY. 60 tablet 3  . temazepam (RESTORIL) 30 MG capsule Take 1 capsule (30 mg total) by mouth at bedtime as needed for sleep. 30 capsule 2   No current facility-administered medications for this visit.     Allergies as of 02/06/2018 - Review Complete 02/06/2018  Allergen Reaction Noted  . Neomycin-bacitracin zn-polymyx Other (See Comments) 02/27/2008  . Penicillins Hives   . Sulfonamide derivatives Hives and Swelling 02/27/2008    Family History  Problem  Relation Age of Onset  . Alcohol abuse Mother   . Anxiety disorder Mother   . Alcohol abuse Father   . Anxiety disorder Father   . Bipolar disorder Sister   . Alcohol abuse Brother   . Anxiety disorder Brother   . Drug abuse Brother   . Dementia Paternal Aunt   . ADD / ADHD Grandchild   . ADD / ADHD Grandchild   . Drug abuse Brother   . Alcohol abuse Brother   . Anxiety disorder Brother   . Seizures Brother   . Alcohol abuse Brother   . Anxiety disorder Brother   . Seizures Brother   . Alcohol abuse Brother   . Anxiety disorder Brother   . Alcohol abuse Brother   . Anxiety disorder Brother   . Heart disease Unknown   . Diabetes Unknown   . Alcohol  abuse Unknown   . Depression Neg Hx   . OCD Neg Hx   . Paranoid behavior Neg Hx   . Schizophrenia Neg Hx   . Sexual abuse Neg Hx   . Physical abuse Neg Hx   . Colon cancer Neg Hx     Social History   Socioeconomic History  . Marital status: Married    Spouse name: Not on file  . Number of children: Not on file  . Years of education: 12th grade  . Highest education level: Not on file  Occupational History  . Occupation: disabled    Associate Professor: UNEMPLOYED  Social Needs  . Financial resource strain: Not on file  . Food insecurity:    Worry: Not on file    Inability: Not on file  . Transportation needs:    Medical: Not on file    Non-medical: Not on file  Tobacco Use  . Smoking status: Former Smoker    Packs/day: 0.25    Years: 5.00    Pack years: 1.25    Types: Cigarettes    Last attempt to quit: 06/26/2003    Years since quitting: 14.6  . Smokeless tobacco: Never Used  Substance and Sexual Activity  . Alcohol use: Yes    Alcohol/week: 0.0 oz    Comment: occasionally; once a month.  . Drug use: No    Comment: has a past history of street drug use  . Sexual activity: Never    Birth control/protection: Surgical  Lifestyle  . Physical activity:    Days per week: Not on file    Minutes per session: Not on  file  . Stress: Not on file  Relationships  . Social connections:    Talks on phone: Not on file    Gets together: Not on file    Attends religious service: Not on file    Active member of club or organization: Not on file    Attends meetings of clubs or organizations: Not on file    Relationship status: Not on file  Other Topics Concern  . Not on file  Social History Narrative  . Not on file    Review of Systems: Gen: Denies fever, chills, anorexia. Denies fatigue, weakness, weight loss.  CV: Denies chest pain, palpitations, syncope, peripheral edema, and claudication. Resp: Denies dyspnea at rest, cough, wheezing, coughing up blood, and pleurisy. GI: see HPI  Derm: Denies rash, itching, dry skin Psych: Denies depression, anxiety, memory loss, confusion. No homicidal or suicidal ideation.  Heme: Denies bruising, bleeding, and enlarged lymph nodes.  Physical Exam: BP (!) 150/86   Pulse 73   Temp 97.8 F (36.6 C) (Oral)   Ht  (1.626 m)   Wt 253 lb 6.4 oz (114.9 kg)   BMI 43.50 kg/m  General:   Alert and oriented. No distress noted. Pleasant and cooperative.  Head:  Normocephalic and atraumatic. Eyes:  Conjuctiva clear without scleral icterus. Mouth:  Oral mucosa pink and moist. Abdomen:  +BS, soft, non-tender and non-distended. No rebound or guarding. No HSM or masses noted. Rectal: external hemorrhoid non-thrombosed, no internal mass  Msk:  Symmetrical without gross deformities. Normal posture. Extremities:  Without edema. Neurologic:  Alert and  oriented x4 Psych:  Alert and cooperative. Normal mood and affect.

## 2018-02-10 NOTE — Assessment & Plan Note (Signed)
67 year old female with chronic constipation, no improvement with Amitiza or Linzess. Reports only 1 good BM per month. Rectal exam without abnormalities. No concerning physical exam findings. I do note that her TSH was low in July 2018. Will repeat TSH today and update CBC, CMP. As she is only taking narcotics three times a week, will trial Symproic only on the days that she is taking a narcotic. Continue LInzess but only once daily. If no improvement with this, we may need to pursue imaging. Last colonoscopy in 2016 and will need surveillance in 2021 due to remote history of adenomas. She has no alarm symptoms and has been gaining weight. If no improvement with Symproic, could consider trial of Motegrity. Progress report next week.

## 2018-02-11 ENCOUNTER — Telehealth: Payer: Self-pay | Admitting: General Practice

## 2018-02-11 DIAGNOSIS — K5900 Constipation, unspecified: Secondary | ICD-10-CM | POA: Diagnosis not present

## 2018-02-11 LAB — COMPLETE METABOLIC PANEL WITH GFR
AG Ratio: 1.2 (calc) (ref 1.0–2.5)
ALT: 12 U/L (ref 6–29)
AST: 13 U/L (ref 10–35)
Albumin: 3.6 g/dL (ref 3.6–5.1)
Alkaline phosphatase (APISO): 57 U/L (ref 33–130)
BUN: 19 mg/dL (ref 7–25)
CO2: 30 mmol/L (ref 20–32)
Calcium: 9 mg/dL (ref 8.6–10.4)
Chloride: 105 mmol/L (ref 98–110)
Creat: 0.87 mg/dL (ref 0.50–0.99)
GFR, Est African American: 80 mL/min/{1.73_m2} (ref >=60)
GFR, Est Non African American: 69 mL/min/{1.73_m2} (ref >=60)
Globulin: 3 g/dL (calc) (ref 1.9–3.7)
Glucose, Bld: 110 mg/dL — ABNORMAL HIGH (ref 65–99)
Potassium: 4 mmol/L (ref 3.5–5.3)
Sodium: 141 mmol/L (ref 135–146)
Total Bilirubin: 0.4 mg/dL (ref 0.2–1.2)
Total Protein: 6.6 g/dL (ref 6.1–8.1)

## 2018-02-11 LAB — CBC WITH DIFFERENTIAL/PLATELET
Basophils Absolute: 22 cells/uL (ref 0–200)
Basophils Relative: 0.4 %
Eosinophils Absolute: 28 cells/uL (ref 15–500)
Eosinophils Relative: 0.5 %
HCT: 36.2 % (ref 35.0–45.0)
Hemoglobin: 12 g/dL (ref 11.7–15.5)
Lymphs Abs: 2335 cells/uL (ref 850–3900)
MCH: 28 pg (ref 27.0–33.0)
MCHC: 33.1 g/dL (ref 32.0–36.0)
MCV: 84.6 fL (ref 80.0–100.0)
MPV: 11.5 fL (ref 7.5–12.5)
Monocytes Relative: 7.7 %
Neutro Abs: 2783 cells/uL (ref 1500–7800)
Neutrophils Relative %: 49.7 %
Platelets: 164 10*3/uL (ref 140–400)
RBC: 4.28 10*6/uL (ref 3.80–5.10)
RDW: 13 % (ref 11.0–15.0)
Total Lymphocyte: 41.7 %
WBC mixed population: 431 cells/uL (ref 200–950)
WBC: 5.6 10*3/uL (ref 3.8–10.8)

## 2018-02-11 LAB — TSH: TSH: 0.52 mIU/L (ref 0.40–4.50)

## 2018-02-11 MED ORDER — NALDEMEDINE TOSYLATE 0.2 MG PO TABS
1.0000 | ORAL_TABLET | Freq: Every day | ORAL | 3 refills | Status: DC
Start: 2018-02-11 — End: 2018-05-21

## 2018-02-11 NOTE — Progress Notes (Signed)
cc'ed to pcp °

## 2018-02-11 NOTE — Telephone Encounter (Signed)
Symproic sent to pharmacy. Please make sure patient has an appt to follow-up in 2 months.

## 2018-02-11 NOTE — Addendum Note (Signed)
Addended by: Gelene MinkBOONE, ANNA W on: 02/11/2018 03:11 PM   Modules accepted: Orders

## 2018-02-11 NOTE — Telephone Encounter (Signed)
Patient saw Tobi Bastosnna on last week and she gave her samples of Symproic.  This medication is working and she would like a Rx sent in to [facility] [place].

## 2018-02-12 ENCOUNTER — Encounter: Payer: Self-pay | Admitting: Internal Medicine

## 2018-02-12 ENCOUNTER — Other Ambulatory Visit: Payer: Self-pay | Admitting: Orthopedic Surgery

## 2018-02-12 DIAGNOSIS — G894 Chronic pain syndrome: Secondary | ICD-10-CM

## 2018-02-12 NOTE — Telephone Encounter (Signed)
PATIENT SCHEDULED  °

## 2018-02-13 ENCOUNTER — Other Ambulatory Visit: Payer: Self-pay | Admitting: Orthopedic Surgery

## 2018-02-13 DIAGNOSIS — G894 Chronic pain syndrome: Secondary | ICD-10-CM

## 2018-02-13 MED ORDER — OXYCODONE-ACETAMINOPHEN 7.5-325 MG PO TABS
1.0000 | ORAL_TABLET | Freq: Four times a day (QID) | ORAL | 0 refills | Status: DC | PRN
Start: 2018-02-13 — End: 2018-02-27

## 2018-02-18 NOTE — Progress Notes (Signed)
Diane [name]: your CBC is normal. TSH is normal. Liver and kidney numbers look good!

## 2018-02-21 ENCOUNTER — Telehealth: Payer: Self-pay

## 2018-02-21 NOTE — Telephone Encounter (Signed)
Received a letter from [employer], pt was given a temporary supply of Symproic 0.2 mg tab through [employer]. Suggested alternative from drug list is Contulose(Tier @), Amitiza (Tier 3), Relistor (PA Req'd, Tier 5).

## 2018-02-27 ENCOUNTER — Other Ambulatory Visit: Payer: Self-pay | Admitting: Orthopedic Surgery

## 2018-02-27 DIAGNOSIS — G4733 Obstructive sleep apnea (adult) (pediatric): Secondary | ICD-10-CM | POA: Diagnosis not present

## 2018-02-27 DIAGNOSIS — G894 Chronic pain syndrome: Secondary | ICD-10-CM

## 2018-02-27 MED ORDER — OXYCODONE-ACETAMINOPHEN 7.5-325 MG PO TABS: 1.0000 | ORAL_TABLET | Freq: Four times a day (QID) | ORAL | 0 refills | Status: DC | PRN

## 2018-02-28 NOTE — Telephone Encounter (Signed)
PA/Exception [facility] and Symproic 0.2 mg was approved through 09/10/18. Pt will be notified, documents will be scanned in chart when received.

## 2018-03-01 ENCOUNTER — Other Ambulatory Visit: Payer: Self-pay | Admitting: Orthopedic Surgery

## 2018-03-01 ENCOUNTER — Other Ambulatory Visit (HOSPITAL_COMMUNITY): Payer: Self-pay | Admitting: Psychiatry

## 2018-03-01 DIAGNOSIS — F3130 Bipolar disorder, current episode depressed, mild or moderate severity, unspecified: Secondary | ICD-10-CM

## 2018-03-01 DIAGNOSIS — M25562 Pain in left knee: Secondary | ICD-10-CM

## 2018-03-01 DIAGNOSIS — S8002XA Contusion of left knee, initial encounter: Secondary | ICD-10-CM

## 2018-03-01 DIAGNOSIS — F5105 Insomnia due to other mental disorder: Secondary | ICD-10-CM

## 2018-03-07 ENCOUNTER — Telehealth: Payer: Self-pay | Admitting: Orthopedic Surgery
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
## 2018-03-12 ENCOUNTER — Other Ambulatory Visit: Payer: Self-pay | Admitting: Orthopedic Surgery

## 2018-03-12 DIAGNOSIS — G894 Chronic pain syndrome: Secondary | ICD-10-CM

## 2018-03-12 MED ORDER — OXYCODONE-ACETAMINOPHEN 7.5-325 MG PO TABS: 1.0000 | ORAL_TABLET | Freq: Four times a day (QID) | ORAL | 0 refills | Status: DC | PRN

## 2018-03-20 ENCOUNTER — Other Ambulatory Visit: Payer: Self-pay | Admitting: Orthopedic Surgery

## 2018-03-20 ENCOUNTER — Other Ambulatory Visit (HOSPITAL_COMMUNITY): Payer: Self-pay | Admitting: Psychiatry

## 2018-03-20 DIAGNOSIS — R7309 Other abnormal glucose: Secondary | ICD-10-CM | POA: Diagnosis not present

## 2018-03-20 DIAGNOSIS — Z1389 Encounter for screening for other disorder: Secondary | ICD-10-CM | POA: Diagnosis not present

## 2018-03-20 DIAGNOSIS — E039 Hypothyroidism, unspecified: Secondary | ICD-10-CM | POA: Diagnosis not present

## 2018-03-20 DIAGNOSIS — Z0001 Encounter for general adult medical examination with abnormal findings: Secondary | ICD-10-CM | POA: Diagnosis not present

## 2018-03-20 DIAGNOSIS — E782 Mixed hyperlipidemia: Secondary | ICD-10-CM | POA: Diagnosis not present

## 2018-03-20 DIAGNOSIS — Z0000 Encounter for general adult medical examination without abnormal findings: Secondary | ICD-10-CM | POA: Diagnosis not present

## 2018-03-21 ENCOUNTER — Encounter (HOSPITAL_COMMUNITY): Payer: Self-pay | Admitting: Psychiatry

## 2018-03-21 ENCOUNTER — Ambulatory Visit (INDEPENDENT_AMBULATORY_CARE_PROVIDER_SITE_OTHER): Payer: Medicare Other | Admitting: Psychiatry

## 2018-03-21 DIAGNOSIS — F5105 Insomnia due to other mental disorder: Secondary | ICD-10-CM

## 2018-03-21 DIAGNOSIS — F3130 Bipolar disorder, current episode depressed, mild or moderate severity, unspecified: Secondary | ICD-10-CM

## 2018-03-21 MED ORDER — FLUOXETINE HCL 20 MG PO CAPS: 20.0000 mg | ORAL_CAPSULE | Freq: Every day | ORAL | 2 refills | Status: DC

## 2018-03-21 MED ORDER — DIVALPROEX SODIUM 250 MG PO DR TAB: 750.0000 mg | DELAYED_RELEASE_TABLET | Freq: Every day | ORAL | 2 refills | Status: DC

## 2018-03-21 MED ORDER — ALPRAZOLAM 0.5 MG PO TABS: ORAL_TABLET | ORAL | 2 refills | Status: DC

## 2018-03-21 MED ORDER — TEMAZEPAM 30 MG PO CAPS: 30.0000 mg | ORAL_CAPSULE | Freq: Every evening | ORAL | 2 refills | Status: DC | PRN

## 2018-03-21 NOTE — Progress Notes (Signed)
BH MD/PA/NP OP Progress Note  03/21/2018 2:40 PM Diane [name]  MRN:  [id number]  Chief Complaint:  Chief Complaint    Depression; Anxiety; Follow-up     HPI: This patient is a 67 year old black female with a history of bipolar disorder who comes in for follow-up today.  She is on disability and living with her boyfriend in [place].  The patient returns after 3 months.  Overall she is doing fairly well.  She fell a few weeks ago and this worsened her right knee pain.  This is the side in which she has had knee replacement.  She is going to see her orthopedic next week.  Her boyfriend's shift has changed and now he is able to go out with her more during the day so she is getting out more.  She states that she is sleeping well.  Her mood seems to be fairly good although she is overly focused on her knee.  She denies significant anxiety or depression and feels that her medications are helpful. Visit Diagnosis:    ICD-10-CM   1. Bipolar I disorder, most recent episode depressed (HCC) F31.30 divalproex (DEPAKOTE) 250 MG DR tablet  2. Insomnia due to mental disorder F51.05 ALPRAZolam (XANAX) 0.5 MG tablet    Past Psychiatric History: She has a history of [facility].  At that time she was using drugs and alcohol.  She has had outpatient treatment ever since  Past Medical History:  Past Medical History:  Diagnosis Date  . Allergic rhinitis   . Anxiety   . Arthritis   . Cataracts, bilateral   . Chronic back pain    Sees pain mamagement clinic  . Depression   . GERD (gastroesophageal reflux disease)   . Headaches, cluster 3 yrs ago   last one  . High cholesterol   . Hypertension   . Hypothyroid   . Immature cataract   . Mania (HCC)   . PTSD (post-traumatic stress disorder)   . Seizures (HCC) 20 yrs ago   unknown etiology-on meds  . Sleep apnea    STOP BANG score=4  . 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[facility])     Past Surgical History:   Procedure Laterality Date  . BACK SURGERY    . CATARACT EXTRACTION W/PHACO Left 01/04/2016   Procedure: CATARACT EXTRACTION PHACO AND INTRAOCULAR LENS PLACEMENT (IOC);  Surgeon: Jethro Bolus, MD;  Location: [facility];  Service: Ophthalmology;  Laterality: Left;  CDE:4.70  . CATARACT EXTRACTION W/PHACO Right 01/18/2016   Procedure: CATARACT EXTRACTION PHACO AND INTRAOCULAR LENS PLACEMENT RIGHT EYE; CDE:  4.66;  Surgeon: Jethro Bolus, MD;  Location: [facility];  Service: Ophthalmology;  Laterality: Right;  . CHOLECYSTECTOMY    . COLONOSCOPY  10/16/2008   WUJ:[id number], otherwise normal rectum; pancolonic diverticula the remainder of the colonic mucosa appeared normal. Next TCS due 10/2013.  Marland Kitchen COLONOSCOPY  06/28/2006   RMR: Internal hemorrhoids. Diminutive rectal polyps, cold biopsied/ Pancolonic diverticula. Polyps in the right colon removed   . COLONOSCOPY WITH PROPOFOL N/A 05/20/2015   RMR: Pancolonic diverticulosis. Redundant colon   . ESOPHAGOGASTRODUODENOSCOPY  10/16/2008   AOZ:HYQMV hiatal hernia otherwise normal esophagus, stomach  D1, D2, status post passage of a 56-French Maloney dilator  . ESOPHAGOGASTRODUODENOSCOPY  06/28/2006   HQI:ONGEXB esophagus. Small hiatal hernia. Otherwise normal stomach, D1 and D2, status post passage of a 56 [place] Maloney dilator  . ESOPHAGOGASTRODUODENOSCOPY (EGD) WITH ESOPHAGEAL DILATION N/A 07/14/2013   RMR: Abnormal  esophagus of uncertain significance-status post esophageal biospy. small hiatal hernia.   Marland Kitchen ESOPHAGOGASTRODUODENOSCOPY (EGD) WITH PROPOFOL N/A 05/20/2015   RMR: NORMAL EGD status post maloney dilation   . FOOT SURGERY     left foot-  . INCISION AND DRAINAGE Right 01/03/2013   Procedure: INCISION AND DRAINAGE;  Surgeon: Vickki Hearing, MD;  Location: [facility];  Service: Orthopedics;  Laterality: Right;  . KNEE ARTHROSCOPY  01/2012   right  . KNEE ARTHROSCOPY WITH LATERAL RELEASE Right 01/03/2013   Procedure: Lateral Release Patella Right Knee;   Surgeon: Vickki Hearing, MD;  Location: [facility];  Service: Orthopedics;  Laterality: Right;  . left foot  x 2  . LUMBAR DISC SURGERY  L4-5  . MALONEY DILATION N/A 05/20/2015   Procedure: Elease Hashimoto DILATION;  Surgeon: Corbin Ade, MD;  Location: [facility];  Service: Endoscopy;  Laterality: N/A;  Maloney dilator # 54  . oophoectomy  bilateral  . PARTIAL HYSTERECTOMY    . TOTAL KNEE ARTHROPLASTY  07/01/2012   Procedure: TOTAL KNEE ARTHROPLASTY;  Surgeon: Vickki Hearing, MD;  Location: [facility];  Service: Orthopedics;  Laterality: Right;  . TOTAL KNEE REVISION Right 01/03/2013   Procedure: Patellaplasty Right Knee;  Surgeon: Vickki Hearing, MD;  Location: [facility];  Service: Orthopedics;  Laterality: Right;    Family Psychiatric History: See below  Family History:  Family History  Problem Relation Age of Onset  . Alcohol abuse Mother   . Anxiety disorder Mother   . Alcohol abuse Father   . Anxiety disorder Father   . Bipolar disorder Sister   . Alcohol abuse Brother   . Anxiety disorder Brother   . Drug abuse Brother   . Dementia Paternal Aunt   . ADD / ADHD Grandchild   . ADD / ADHD Grandchild   . Drug abuse Brother   . Alcohol abuse Brother   . Anxiety disorder Brother   . Seizures Brother   . Alcohol abuse Brother   . Anxiety disorder Brother   . Seizures Brother   . Alcohol abuse Brother   . Anxiety disorder Brother   . Alcohol abuse Brother   . Anxiety disorder Brother   . Heart disease Unknown   . Diabetes Unknown   . Alcohol abuse Unknown   . Depression Neg Hx   . OCD Neg Hx   . Paranoid behavior Neg Hx   . Schizophrenia Neg Hx   . Sexual abuse Neg Hx   . Physical abuse Neg Hx   . Colon cancer Neg Hx     Social History:  Social History   Socioeconomic History  . Marital status: Married    Spouse name: Not on file  . Number of children: Not on file  . Years of education: 12th grade  . Highest education level: Not on file  Occupational History  .  Occupation: disabled    Associate Professor: UNEMPLOYED  Social Needs  . Financial resource strain: Not on file  . Food insecurity:    Worry: Not on file    Inability: Not on file  . Transportation needs:    Medical: Not on file    Non-medical: Not on file  Tobacco Use  . Smoking status: Former Smoker    Packs/day: 0.25    Years: 5.00    Pack years: 1.25    Types: Cigarettes    Last attempt to quit: 06/26/2003    Years since quitting: 14.7  . Smokeless tobacco: Never Used  Substance and Sexual Activity  . Alcohol use: Yes    Alcohol/week: 0.0 oz    Comment: occasionally; once a month.  . Drug use: No    Comment: has a past history of street drug use  . Sexual activity: Never    Birth control/protection: Surgical  Lifestyle  . Physical activity:    Days per week: Not on file    Minutes per session: Not on file  . Stress: Not on file  Relationships  . Social connections:    Talks on phone: Not on file    Gets together: Not on file    Attends religious service: Not on file    Active member of club or organization: Not on file    Attends meetings of clubs or organizations: Not on file    Relationship status: Not on file  Other Topics Concern  . Not on file  Social History Narrative  . Not on file    Allergies:    Metabolic Disorder Labs: Lab Results  Component Value Date   HGBA1C 5.8 (H) 01/25/2012   MPG 120 (H) 01/25/2012   No results found for: PROLACTIN Lab Results  Component Value Date   CHOL  12/10/2010    69        ATP III CLASSIFICATION:  <200     mg/dL   Desirable  147-829200-239  mg/dL   Borderline High  >=562>=240    mg/dL   High          TRIG 38 12/10/2010   HDL 28 (L) 12/10/2010   CHOLHDL 2.5 12/10/2010   VLDL 8 12/10/2010   LDLCALC  12/10/2010    33        Total Cholesterol/HDL:CHD Risk Coronary Heart Disease Risk Table                     Men   Women  1/2 Average Risk   3.4   3.3  Average Risk       5.0   4.4  2 X Average Risk   9.6   7.1  3 X Average  Risk  23.4   11.0        Use the calculated Patient Ratio above and the CHD Risk Table to determine the patient's CHD Risk.        ATP III CLASSIFICATION (LDL):  <100     mg/dL   Optimal  130-865100-129  mg/dL   Near or Above                    Optimal  130-159  mg/dL   Borderline  [id number]  mg/dL   High  >295>190     mg/dL   Very High   LDLCALC 71 07/12/2009   Lab Results  Component Value Date   TSH 0.52 02/11/2018   TSH <0.008 (L) 12/09/2010    Therapeutic Level Labs: No results found for: LITHIUM Lab Results  Component Value Date   VALPROATE 71.5 01/30/2017   VALPROATE 24.0 (L) 07/10/2014   No components found for:  CBMZ  Current Medications: Current Outpatient Medications  Medication Sig Dispense Refill  . ALPRAZolam (XANAX) 0.5 MG tablet TAKE ONE TABLET BY MOUTH TWICE DAILY AND TAKE TWO (2) TABLETS AT BEDTIME 120 tablet 2  . amLODipine (NORVASC) 5 MG tablet Take 5 mg by mouth daily.    Marland Kitchen. atorvastatin (LIPITOR) 20 MG tablet Take 20 mg by mouth daily.  1  . cyclobenzaprine (FLEXERIL) 10  MG tablet TAKE ONE TABLET BY MOUTH THREE TIMES DAILY AS NEEDED FOR MUSCLE SPASMS. 90 tablet 5  . divalproex (DEPAKOTE) 250 MG DR tablet Take 3 tablets (750 mg total) by mouth at bedtime. 90 tablet 2  . FLUoxetine (PROZAC) 20 MG capsule Take 1 capsule (20 mg total) by mouth daily. 30 capsule 2  . hydrocortisone (ANUSOL-HC) 2.5 % rectal cream Place 1 application rectally 2 (two) times daily. 30 g 1  . levothyroxine (SYNTHROID, LEVOTHROID) 150 MCG tablet Take 150 mcg by mouth daily.  5  . linaclotide (LINZESS) 290 MCG CAPS capsule Take 1 capsule (290 mcg total) by mouth daily before breakfast. 90 capsule 3  . lisinopril (PRINIVIL,ZESTRIL) 10 MG tablet Take 10 mg by mouth daily.    . meloxicam (MOBIC) 7.5 MG tablet TAKE ONE TABLET BY MOUTH DAILY 30 tablet 5  . Naldemedine Tosylate (SYMPROIC) 0.2 MG TABS Take 1 tablet by mouth daily. On the days you take pain medication 30 tablet 3  . oxybutynin  (DITROPAN-XL) 5 MG 24 hr tablet Take 5 mg by mouth at bedtime.    Marland Kitchen oxyCODONE-acetaminophen (PERCOCET) 7.5-325 MG tablet Take 1 tablet by mouth every 6 (six) hours as needed for severe pain. 56 tablet 0  . pantoprazole (PROTONIX) 40 MG tablet TAKE ONE TABLET BY MOUTH TWICE DAILY. 60 tablet 3  . temazepam (RESTORIL) 30 MG capsule Take 1 capsule (30 mg total) by mouth at bedtime as needed. for sleep 30 capsule 2   No current facility-administered medications for this visit.      Musculoskeletal: Strength & Muscle Tone: within normal limits Gait & Station: normal Patient leans: N/A  Psychiatric Specialty Exam: Review of Systems  Musculoskeletal: Positive for joint pain.  All other systems reviewed and are negative.   Blood pressure 138/79, pulse 82, height 5\' 3"  (1.6 m), weight 254 lb (115.2 kg), SpO2 93 %.Body mass index is 44.99 kg/m.  General Appearance: Casual, Neat and Well Groomed  Eye Contact:  Good  Speech:  Clear and Coherent  Volume:  Normal  Mood:  Euthymic  Affect:  Congruent  Thought Process:  Goal Directed  Orientation:  Full (Time, Place, and Person)  Thought Content: WDL   Suicidal Thoughts:  No  Homicidal Thoughts:  No  Memory:  Immediate;   Good Recent;   Good Remote;   Fair  Judgement:  Good  Insight:  Fair  Psychomotor Activity:  Decreased  Concentration:  Concentration: Fair and Attention Span: Fair  Recall:  Good  Fund of Knowledge: Good  Language: Good  Akathisia:  No  Handed:  Right  AIMS (if indicated): not done  Assets:  Communication Skills Desire for Improvement Resilience Social Support Talents/Skills  ADL's:  Intact  Cognition: WNL  Sleep:  Good   Screenings:   Assessment and Plan: This patient is a 67 year old female with a history of depression anxiety and a remote history of substance abuse.  She will continue Prozac 20 mg daily for depression, Xanax 0.5 mg twice a day and 1 mg at bedtime for anxiety, Depakote 750 mg at bedtime  for mood stability and Restoril 30 mg at bedtime for sleep.  She states that all the medications are still helping her mood and anxiety.  She will return to see me in 3 months   [name], MD 03/21/2018, 2:40 PM

## 2018-03-25 ENCOUNTER — Other Ambulatory Visit: Payer: Self-pay | Admitting: Nurse Practitioner

## 2018-03-25 ENCOUNTER — Encounter: Payer: Self-pay | Admitting: Orthopedic Surgery

## 2018-03-25 ENCOUNTER — Ambulatory Visit (INDEPENDENT_AMBULATORY_CARE_PROVIDER_SITE_OTHER): Payer: Medicare Other | Admitting: Orthopedic Surgery

## 2018-03-25 ENCOUNTER — Ambulatory Visit (INDEPENDENT_AMBULATORY_CARE_PROVIDER_SITE_OTHER): Payer: Medicare Other

## 2018-03-25 VITALS — BP 155/80 | HR 80 | Ht 63.0 in | Wt 252.0 lb

## 2018-03-25 DIAGNOSIS — Z96651 Presence of right artificial knee joint: Secondary | ICD-10-CM | POA: Diagnosis not present

## 2018-03-25 DIAGNOSIS — M5441 Lumbago with sciatica, right side: Secondary | ICD-10-CM

## 2018-03-25 DIAGNOSIS — M25561 Pain in right knee: Secondary | ICD-10-CM | POA: Diagnosis not present

## 2018-03-25 DIAGNOSIS — Z6841 Body Mass Index (BMI) 40.0 and over, adult: Secondary | ICD-10-CM

## 2018-03-25 DIAGNOSIS — M25461 Effusion, right knee: Secondary | ICD-10-CM

## 2018-03-25 DIAGNOSIS — K219 Gastro-esophageal reflux disease without esophagitis: Secondary | ICD-10-CM

## 2018-03-25 MED ORDER — OXYCODONE-ACETAMINOPHEN 10-325 MG PO TABS: 1.0000 | ORAL_TABLET | ORAL | 0 refills | Status: DC | PRN
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
## 2018-03-26 DIAGNOSIS — Z96651 Presence of right artificial knee joint: Secondary | ICD-10-CM | POA: Diagnosis not present

## 2018-03-26 DIAGNOSIS — M5441 Lumbago with sciatica, right side: Secondary | ICD-10-CM | POA: Diagnosis not present

## 2018-03-26 DIAGNOSIS — M25461 Effusion, right knee: Secondary | ICD-10-CM | POA: Diagnosis not present

## 2018-03-26 DIAGNOSIS — M25561 Pain in right knee: Secondary | ICD-10-CM | POA: Diagnosis not present

## 2018-03-27 LAB — CBC WITH DIFFERENTIAL/PLATELET
Basophils Absolute: 18 cells/uL (ref 0–200)
Basophils Relative: 0.4 %
Eosinophils Absolute: 41 cells/uL (ref 15–500)
Eosinophils Relative: 0.9 %
HCT: 36.5 % (ref 35.0–45.0)
Hemoglobin: 12 g/dL (ref 11.7–15.5)
Lymphs Abs: 1953 cells/uL (ref 850–3900)
MCH: 28.2 pg (ref 27.0–33.0)
MCHC: 32.9 g/dL (ref 32.0–36.0)
MCV: 85.7 fL (ref 80.0–100.0)
MPV: 11.7 fL (ref 7.5–12.5)
Monocytes Relative: 9.4 %
Neutro Abs: 2066 cells/uL (ref 1500–7800)
Neutrophils Relative %: 45.9 %
Platelets: 171 10*3/uL (ref 140–400)
RBC: 4.26 10*6/uL (ref 3.80–5.10)
RDW: 12.6 % (ref 11.0–15.0)
Total Lymphocyte: 43.4 %
WBC mixed population: 423 cells/uL (ref 200–950)
WBC: 4.5 10*3/uL (ref 3.8–10.8)

## 2018-03-27 LAB — SEDIMENTATION RATE: Sed Rate: 2 mm/h (ref 0–30)

## 2018-03-27 LAB — C-REACTIVE PROTEIN: CRP: 0.8 mg/L (ref <8.0)

## 2018-03-29 ENCOUNTER — Telehealth: Payer: Self-pay | Admitting: Orthopedic Surgery

## 2018-03-29 DIAGNOSIS — G4733 Obstructive sleep apnea (adult) (pediatric): Secondary | ICD-10-CM | POA: Diagnosis not present

## 2018-03-29 DIAGNOSIS — Z96659 Presence of unspecified artificial knee joint: Secondary | ICD-10-CM | POA: Diagnosis not present

## 2018-03-29 NOTE — Telephone Encounter (Signed)
Patient is asking for Dr Romeo AppleHarrison to help her to get home care for ADL's - she has spoken with local office, [facility], and was given a form for "independent assessment for personal care services" by this facility. Please advise regarding ordering home care. Form is in front office box.

## 2018-03-29 NOTE — Telephone Encounter (Signed)
She wants someone to help her clean her home she is having difficulty with this. I have advised this is something she would have to pay for, she states she is not interested.

## 2018-04-07 ENCOUNTER — Other Ambulatory Visit: Payer: Self-pay | Admitting: Orthopedic Surgery

## 2018-04-08 MED ORDER — OXYCODONE-ACETAMINOPHEN 10-325 MG PO TABS: 1.0000 | ORAL_TABLET | ORAL | 0 refills | Status: DC | PRN

## 2018-04-12 ENCOUNTER — Encounter: Payer: Self-pay | Admitting: Gastroenterology

## 2018-04-12 ENCOUNTER — Ambulatory Visit: Payer: Self-pay | Admitting: Gastroenterology

## 2018-04-22 ENCOUNTER — Telehealth: Payer: Self-pay | Admitting: Orthopedic Surgery

## 2018-04-22 NOTE — Telephone Encounter (Signed)
Oxycodone-Acetaminophen  10/325 mg Qty  84 Tablets  Take 1 tablet by mouth every 4 (four) hours as needed for pain.  PATIENT USES [name] [place]

## 2018-04-23 ENCOUNTER — Other Ambulatory Visit: Payer: Self-pay | Admitting: Orthopedic Surgery

## 2018-04-23 DIAGNOSIS — G894 Chronic pain syndrome: Secondary | ICD-10-CM

## 2018-04-23 MED ORDER — OXYCODONE-ACETAMINOPHEN 7.5-325 MG PO TABS: 1.0000 | ORAL_TABLET | ORAL | 0 refills | Status: DC | PRN

## 2018-04-23 NOTE — Progress Notes (Unsigned)
[email or web address] search completed   

## 2018-04-24 ENCOUNTER — Other Ambulatory Visit: Payer: Self-pay | Admitting: Orthopaedic Surgery

## 2018-04-24 ENCOUNTER — Other Ambulatory Visit: Payer: Self-pay | Admitting: Orthopedic Surgery

## 2018-04-24 DIAGNOSIS — G894 Chronic pain syndrome: Secondary | ICD-10-CM

## 2018-04-24 NOTE — Telephone Encounter (Signed)
Oxycodone-Acetaminophen 7.5/325 mg Qty 42 Tablets  Take 1 tablet by mouth every 4(four) hours as needed for severe pain.  PAT[place] [name] [place]

## 2018-04-24 NOTE — Telephone Encounter (Signed)
Oxycodone-Acetaminophen 7.5/325 mg Qty 42 Tablets  Take 1 tablet by mouth every 4(four) hours as needed for severe pain.  PATIENT USES [name] [place]  This shows you printed, can you send in now?

## 2018-04-25 MED ORDER — OXYCODONE-ACETAMINOPHEN 7.5-325 MG PO TABS: 1.0000 | ORAL_TABLET | ORAL | 0 refills | Status: DC | PRN

## 2018-04-29 DIAGNOSIS — R600 Localized edema: Secondary | ICD-10-CM | POA: Diagnosis not present

## 2018-04-29 DIAGNOSIS — I10 Essential (primary) hypertension: Secondary | ICD-10-CM | POA: Diagnosis not present

## 2018-04-29 DIAGNOSIS — Z23 Encounter for immunization: Secondary | ICD-10-CM | POA: Diagnosis not present

## 2018-04-29 DIAGNOSIS — E063 Autoimmune thyroiditis: Secondary | ICD-10-CM | POA: Diagnosis not present

## 2018-04-29 DIAGNOSIS — I872 Venous insufficiency (chronic) (peripheral): Secondary | ICD-10-CM | POA: Diagnosis not present

## 2018-04-30 NOTE — Telephone Encounter (Signed)
Dr. H's patient. 

## 2018-05-02 ENCOUNTER — Other Ambulatory Visit: Payer: Self-pay

## 2018-05-02 DIAGNOSIS — G894 Chronic pain syndrome: Secondary | ICD-10-CM

## 2018-05-03 MED ORDER — OXYCODONE-ACETAMINOPHEN 7.5-325 MG PO TABS: 1.0000 | ORAL_TABLET | ORAL | 0 refills | Status: DC | PRN

## 2018-05-09 ENCOUNTER — Other Ambulatory Visit: Payer: Self-pay | Admitting: Orthopedic Surgery

## 2018-05-09 DIAGNOSIS — G894 Chronic pain syndrome: Secondary | ICD-10-CM

## 2018-05-09 NOTE — Telephone Encounter (Signed)
Oxycodone-Acetaminophen 7.5/325 mg  Qty 42 Tablets  Take 1 tablet by mouth every 4 (four) hours as needed for severe pain.  PATIENT USES [name] [place]  Patient is aware that it is after lunchtime on Thursday and this request may not be filled. Stated she tried to send it through [employer].

## 2018-05-10 MED ORDER — OXYCODONE-ACETAMINOPHEN 7.5-325 MG PO TABS: 1.0000 | ORAL_TABLET | ORAL | 0 refills | Status: DC | PRN

## 2018-05-14 ENCOUNTER — Other Ambulatory Visit: Payer: Self-pay | Admitting: Gastroenterology

## 2018-05-16 ENCOUNTER — Other Ambulatory Visit: Payer: Self-pay

## 2018-05-16 DIAGNOSIS — G894 Chronic pain syndrome: Secondary | ICD-10-CM

## 2018-05-17 ENCOUNTER — Telehealth: Payer: Self-pay | Admitting: Radiology

## 2018-05-17 MED ORDER — OXYCODONE-ACETAMINOPHEN 7.5-325 MG PO TABS: 1.0000 | ORAL_TABLET | ORAL | 0 refills | Status: DC | PRN

## 2018-05-17 NOTE — Telephone Encounter (Signed)
Sent my chart message asking patient not to request refills multiple times, have gotten this from her several times in past few weeks.

## 2018-05-17 NOTE — Telephone Encounter (Signed)
duplicate

## 2018-05-21 ENCOUNTER — Other Ambulatory Visit (HOSPITAL_COMMUNITY): Payer: Self-pay | Admitting: Psychiatry

## 2018-05-21 ENCOUNTER — Other Ambulatory Visit: Payer: Self-pay | Admitting: Gastroenterology

## 2018-05-23 ENCOUNTER — Other Ambulatory Visit: Payer: Self-pay

## 2018-05-23 DIAGNOSIS — G894 Chronic pain syndrome: Secondary | ICD-10-CM

## 2018-05-24 MED ORDER — OXYCODONE-ACETAMINOPHEN 7.5-325 MG PO TABS: 1.0000 | ORAL_TABLET | ORAL | 0 refills | Status: DC | PRN

## 2018-05-30 ENCOUNTER — Other Ambulatory Visit: Payer: Self-pay

## 2018-05-30 DIAGNOSIS — G894 Chronic pain syndrome: Secondary | ICD-10-CM

## 2018-05-31 MED ORDER — OXYCODONE-ACETAMINOPHEN 7.5-325 MG PO TABS: 1.0000 | ORAL_TABLET | ORAL | 0 refills | Status: DC | PRN

## 2018-06-04 ENCOUNTER — Other Ambulatory Visit: Payer: Self-pay

## 2018-06-04 DIAGNOSIS — G894 Chronic pain syndrome: Secondary | ICD-10-CM

## 2018-06-05 MED ORDER — OXYCODONE-ACETAMINOPHEN 7.5-325 MG PO TABS: 1.0000 | ORAL_TABLET | ORAL | 0 refills | Status: DC | PRN

## 2018-06-06 ENCOUNTER — Other Ambulatory Visit (HOSPITAL_COMMUNITY): Payer: Self-pay | Admitting: Psychiatry

## 2018-06-06 DIAGNOSIS — F5105 Insomnia due to other mental disorder: Secondary | ICD-10-CM

## 2018-06-07 ENCOUNTER — Other Ambulatory Visit (HOSPITAL_COMMUNITY): Payer: Self-pay | Admitting: Psychiatry

## 2018-06-07 DIAGNOSIS — F3130 Bipolar disorder, current episode depressed, mild or moderate severity, unspecified: Secondary | ICD-10-CM

## 2018-06-12 ENCOUNTER — Other Ambulatory Visit: Payer: Self-pay

## 2018-06-12 DIAGNOSIS — G894 Chronic pain syndrome: Secondary | ICD-10-CM

## 2018-06-12 MED ORDER — OXYCODONE-ACETAMINOPHEN 7.5-325 MG PO TABS: 1.0000 | ORAL_TABLET | ORAL | 0 refills | Status: DC | PRN

## 2018-06-18 ENCOUNTER — Other Ambulatory Visit (HOSPITAL_COMMUNITY): Payer: Self-pay | Admitting: Psychiatry

## 2018-06-19 ENCOUNTER — Other Ambulatory Visit: Payer: Self-pay | Admitting: Orthopedic Surgery

## 2018-06-19 ENCOUNTER — Other Ambulatory Visit: Payer: Self-pay

## 2018-06-19 DIAGNOSIS — G894 Chronic pain syndrome: Secondary | ICD-10-CM

## 2018-06-19 MED ORDER — OXYCODONE-ACETAMINOPHEN 7.5-325 MG PO TABS: 1.0000 | ORAL_TABLET | ORAL | 0 refills | Status: DC | PRN

## 2018-06-24 ENCOUNTER — Ambulatory Visit (INDEPENDENT_AMBULATORY_CARE_PROVIDER_SITE_OTHER): Payer: Medicare Other | Admitting: Psychiatry

## 2018-06-24 ENCOUNTER — Encounter (HOSPITAL_COMMUNITY): Payer: Self-pay | Admitting: Psychiatry

## 2018-06-24 DIAGNOSIS — F3130 Bipolar disorder, current episode depressed, mild or moderate severity, unspecified: Secondary | ICD-10-CM

## 2018-06-24 DIAGNOSIS — Z87891 Personal history of nicotine dependence: Secondary | ICD-10-CM

## 2018-06-24 DIAGNOSIS — F5105 Insomnia due to other mental disorder: Secondary | ICD-10-CM

## 2018-06-24 MED ORDER — ALPRAZOLAM 0.5 MG PO TABS: ORAL_TABLET | ORAL | 2 refills | Status: DC

## 2018-06-24 MED ORDER — TEMAZEPAM 30 MG PO CAPS: 30.0000 mg | ORAL_CAPSULE | Freq: Every evening | ORAL | 2 refills | Status: DC | PRN

## 2018-06-24 MED ORDER — FLUOXETINE HCL 20 MG PO CAPS: 20.0000 mg | ORAL_CAPSULE | Freq: Every day | ORAL | 2 refills | Status: DC

## 2018-06-24 MED ORDER — DIVALPROEX SODIUM 250 MG PO DR TAB: 750.0000 mg | DELAYED_RELEASE_TABLET | Freq: Every day | ORAL | 2 refills | Status: DC

## 2018-06-24 NOTE — Progress Notes (Signed)
BH MD/PA/NP OP Progress Note  06/24/2018 11:49 AM Diane [name]  MRN:  [id number]  Chief Complaint:  Chief Complaint    Depression; Anxiety; Follow-up     HPI: This patient is a 67 year old black female with a history of bipolar disorder who comes in for follow-up today.  She is on disability and living with her boyfriend in [place].  The patient returns after 3 months.  Overall she is doing okay.  She states that she feels stuck because she does not have a vehicle to use.  She could use her boyfriend's SUV but she feels like it is too tall for her and she cannot feel comfortable driving it.  She is worried about several family members who have been diagnosed with cancer and this has been concerning.  Overall she is sleeping well her mood is fairly good and she denies significant anxiety depression or suicidal ideation.  She still feels that her medications are helpful   Visit Diagnosis:    ICD-10-CM   1. Bipolar I disorder, most recent episode depressed (HCC) F31.30 divalproex (DEPAKOTE) 250 MG DR tablet  2. Insomnia due to mental disorder F51.05 ALPRAZolam (XANAX) 0.5 MG tablet    Past Psychiatric History: She has a history of one [facility].  At the time she was using drugs and alcohol.  She has had outpatient treatment ever since  Past Medical History:  Past Medical History:  Diagnosis Date  . Allergic rhinitis   . Anxiety   . Arthritis   . Cataracts, bilateral   . Chronic back pain    Sees pain mamagement clinic  . Depression   . GERD (gastroesophageal reflux disease)   . Headaches, cluster 3 yrs ago   last one  . High cholesterol   . Hypertension   . Hypothyroid   . Immature cataract   . Mania (HCC)   . PTSD (post-traumatic stress disorder)   . Seizures (HCC) 20 yrs ago   unknown etiology-on meds  . Sleep apnea    STOP BANG score=4  . 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[facility])     Past Surgical History:  Procedure  Laterality Date  . BACK SURGERY    . CATARACT EXTRACTION W/PHACO Left 01/04/2016   Procedure: CATARACT EXTRACTION PHACO AND INTRAOCULAR LENS PLACEMENT (IOC);  Surgeon: Jethro Bolus, MD;  Location: [facility];  Service: Ophthalmology;  Laterality: Left;  CDE:4.70  . CATARACT EXTRACTION W/PHACO Right 01/18/2016   Procedure: CATARACT EXTRACTION PHACO AND INTRAOCULAR LENS PLACEMENT RIGHT EYE; CDE:  4.66;  Surgeon: Jethro Bolus, MD;  Location: [facility];  Service: Ophthalmology;  Laterality: Right;  . CHOLECYSTECTOMY    . COLONOSCOPY  10/16/2008   YTK:[id number], otherwise normal rectum; pancolonic diverticula the remainder of the colonic mucosa appeared normal. Next TCS due 10/2013.  Marland Kitchen COLONOSCOPY  06/28/2006   RMR: Internal hemorrhoids. Diminutive rectal polyps, cold biopsied/ Pancolonic diverticula. Polyps in the right colon removed   . COLONOSCOPY WITH PROPOFOL N/A 05/20/2015   RMR: Pancolonic diverticulosis. Redundant colon   . ESOPHAGOGASTRODUODENOSCOPY  10/16/2008   DDU:KGURK hiatal hernia otherwise normal esophagus, stomach  D1, D2, status post passage of a 56-French Maloney dilator  . ESOPHAGOGASTRODUODENOSCOPY  06/28/2006   YHC:WCBJSE esophagus. Small hiatal hernia. Otherwise normal stomach, D1 and D2, status post passage of a 56 [place] Maloney dilator  . ESOPHAGOGASTRODUODENOSCOPY (EGD) WITH ESOPHAGEAL DILATION N/A 07/14/2013   RMR: Abnormal esophagus of uncertain significance-status post esophageal biospy. small hiatal hernia.   Marland Kitchen  ESOPHAGOGASTRODUODENOSCOPY (EGD) WITH PROPOFOL N/A 05/20/2015   RMR: NORMAL EGD status post maloney dilation   . FOOT SURGERY     left foot-  . INCISION AND DRAINAGE Right 01/03/2013   Procedure: INCISION AND DRAINAGE;  Surgeon: Vickki Hearing, MD;  Location: [facility];  Service: Orthopedics;  Laterality: Right;  . KNEE ARTHROSCOPY  01/2012   right  . KNEE ARTHROSCOPY WITH LATERAL RELEASE Right 01/03/2013   Procedure: Lateral Release Patella Right Knee;  Surgeon: Vickki Hearing, MD;  Location: [facility];  Service: Orthopedics;  Laterality: Right;  . left foot  x 2  . LUMBAR DISC SURGERY  L4-5  . MALONEY DILATION N/A 05/20/2015   Procedure: Elease Hashimoto DILATION;  Surgeon: Corbin Ade, MD;  Location: [facility];  Service: Endoscopy;  Laterality: N/A;  Maloney dilator # 54  . oophoectomy  bilateral  . PARTIAL HYSTERECTOMY    . TOTAL KNEE ARTHROPLASTY  07/01/2012   Procedure: TOTAL KNEE ARTHROPLASTY;  Surgeon: Vickki Hearing, MD;  Location: [facility];  Service: Orthopedics;  Laterality: Right;  . TOTAL KNEE REVISION Right 01/03/2013   Procedure: Patellaplasty Right Knee;  Surgeon: Vickki Hearing, MD;  Location: [facility];  Service: Orthopedics;  Laterality: Right;    Family Psychiatric History: See below  Family History:  Family History  Problem Relation Age of Onset  . Alcohol abuse Mother   . Anxiety disorder Mother   . Alcohol abuse Father   . Anxiety disorder Father   . Bipolar disorder Sister   . Alcohol abuse Brother   . Anxiety disorder Brother   . Drug abuse Brother   . Dementia Paternal Aunt   . ADD / ADHD Grandchild   . ADD / ADHD Grandchild   . Drug abuse Brother   . Alcohol abuse Brother   . Anxiety disorder Brother   . Seizures Brother   . Alcohol abuse Brother   . Anxiety disorder Brother   . Seizures Brother   . Alcohol abuse Brother   . Anxiety disorder Brother   . Alcohol abuse Brother   . Anxiety disorder Brother   . Heart disease Unknown   . Diabetes Unknown   . Alcohol abuse Unknown   . Depression Neg Hx   . OCD Neg Hx   . Paranoid behavior Neg Hx   . Schizophrenia Neg Hx   . Sexual abuse Neg Hx   . Physical abuse Neg Hx   . Colon cancer Neg Hx     Social History:  Social History   Socioeconomic History  . Marital status: Married    Spouse name: Not on file  . Number of children: Not on file  . Years of education: 12th grade  . Highest education level: Not on file  Occupational History  . Occupation: disabled     Associate Professor: UNEMPLOYED  Social Needs  . Financial resource strain: Not on file  . Food insecurity:    Worry: Not on file    Inability: Not on file  . Transportation needs:    Medical: Not on file    Non-medical: Not on file  Tobacco Use  . Smoking status: Former Smoker    Packs/day: 0.25    Years: 5.00    Pack years: 1.25    Types: Cigarettes    Last attempt to quit: 06/26/2003    Years since quitting: 15.0  . Smokeless tobacco: Never Used  Substance and Sexual Activity  . Alcohol use: Yes  Alcohol/week: 0.0 standard drinks    Comment: occasionally; once a month.  . Drug use: No    Comment: has a past history of street drug use  . Sexual activity: Never    Birth control/protection: Surgical  Lifestyle  . Physical activity:    Days per week: Not on file    Minutes per session: Not on file  . Stress: Not on file  Relationships  . Social connections:    Talks on phone: Not on file    Gets together: Not on file    Attends religious service: Not on file    Active member of club or organization: Not on file    Attends meetings of clubs or organizations: Not on file    Relationship status: Not on file  Other Topics Concern  . Not on file  Social History Narrative  . Not on file    Allergies:  neomycin  Metabolic Disorder Labs: Lab Results  Component Value Date   HGBA1C 5.8 (H) 01/25/2012   MPG 120 (H) 01/25/2012   No results found for: PROLACTIN Lab Results  Component Value Date   CHOL  12/10/2010    69        ATP III CLASSIFICATION:  <200     mg/dL   Desirable  161-096  mg/dL   Borderline High  >=045    mg/dL   High          TRIG 38 12/10/2010   HDL 28 (L) 12/10/2010   CHOLHDL 2.5 12/10/2010   VLDL 8 12/10/2010   LDLCALC  12/10/2010    33        Total Cholesterol/HDL:CHD Risk Coronary Heart Disease Risk Table                     Men   Women  1/2 Average Risk   3.4   3.3  Average Risk       5.0   4.4  2 X Average Risk   9.6   7.1  3 X Average  Risk  23.4   11.0        Use the calculated Patient Ratio above and the CHD Risk Table to determine the patient's CHD Risk.        ATP III CLASSIFICATION (LDL):  <100     mg/dL   Optimal  409-811  mg/dL   Near or Above                    Optimal  130-159  mg/dL   Borderline  914-782  mg/dL   High  >956     mg/dL   Very High   LDLCALC 71 07/12/2009   Lab Results  Component Value Date   TSH 0.52 02/11/2018   TSH <0.008 (L) 12/09/2010    Therapeutic Level Labs: No results found for: LITHIUM Lab Results  Component Value Date   VALPROATE 71.5 01/30/2017   VALPROATE 24.0 (L) 07/10/2014   No components found for:  CBMZ  Current Medications: Current Outpatient Medications  Medication Sig Dispense Refill  . ALPRAZolam (XANAX) 0.5 MG tablet TAKE ONE TABLET BY MOUTH TWICE DAILY AND TAKE TWO (2) TABLETS AT BEDTIME 120 tablet 2  . amLODipine (NORVASC) 5 MG tablet Take 5 mg by mouth daily.    Marland Kitchen atorvastatin (LIPITOR) 20 MG tablet Take 20 mg by mouth daily.  1  . cyclobenzaprine (FLEXERIL) 10 MG tablet TAKE ONE TABLET BY MOUTH THREE TIMES DAILY AS  NEEDED FOR MUSCLE SPASMS. 90 tablet 5  . divalproex (DEPAKOTE) 250 MG DR tablet Take 3 tablets (750 mg total) by mouth at bedtime. 90 tablet 2  . FLUoxetine (PROZAC) 20 MG capsule Take 1 capsule (20 mg total) by mouth daily. 30 capsule 2  . hydrocortisone (ANUSOL-HC) 2.5 % rectal cream Place 1 application rectally 2 (two) times daily. 30 g 1  . levothyroxine (SYNTHROID, LEVOTHROID) 150 MCG tablet Take 150 mcg by mouth daily.  5  . LINZESS 290 MCG CAPS capsule TAKE ONE CAPSULE BY MOUTH DAILY BEFORE BREAKFAST. 90 capsule 3  . lisinopril (PRINIVIL,ZESTRIL) 10 MG tablet Take 10 mg by mouth daily.    . meloxicam (MOBIC) 7.5 MG tablet TAKE ONE TABLET BY MOUTH DAILY 30 tablet 5  . oxybutynin (DITROPAN-XL) 5 MG 24 hr tablet Take 5 mg by mouth at bedtime.    Marland Kitchen oxyCODONE-acetaminophen (PERCOCET) 7.5-325 MG tablet Take 1 tablet by mouth every 4 (four)  hours as needed for severe pain. 42 tablet 0  . pantoprazole (PROTONIX) 40 MG tablet TAKE ONE TABLET BY MOUTH TWICE DAILY. 60 tablet 5  . SYMPROIC 0.2 MG TABS TAKE ONE TABLET BY MOUTH DAILY ON THE DAYS YOU TAKE PAIN MEDICATION 30 tablet 3  . temazepam (RESTORIL) 30 MG capsule Take 1 capsule (30 mg total) by mouth at bedtime as needed. for sleep 30 capsule 2   No current facility-administered medications for this visit.      Musculoskeletal: Strength & Muscle Tone: within normal limits Gait & Station: normal Patient leans: N/A  Psychiatric Specialty Exam: Review of Systems  Musculoskeletal: Positive for joint pain.  All other systems reviewed and are negative.   Blood pressure 134/78, pulse 80, height 5\' 3"  (1.6 m), weight 251 lb (113.9 kg), SpO2 93 %.Body mass index is 44.46 kg/m.  General Appearance: Casual, Neat and Well Groomed  Eye Contact:  Good  Speech:  Clear and Coherent  Volume:  Normal  Mood:  Euthymic  Affect:  Flat  Thought Process:  Goal Directed  Orientation:  Full (Time, Place, and Person)  Thought Content: Rumination   Suicidal Thoughts:  No  Homicidal Thoughts:  No  Memory:  Immediate;   Good Recent;   Good Remote;   Fair  Judgement:  Fair  Insight:  Fair  Psychomotor Activity:  Decreased  Concentration:  Concentration: Good and Attention Span: Good  Recall:  Good  Fund of Knowledge: Fair  Language: Good  Akathisia:  No  Handed:  Right  AIMS (if indicated): not done  Assets:  Communication Skills Desire for Improvement Resilience Social Support Talents/Skills  ADL's:  Intact  Cognition: WNL  Sleep:  Good   Screenings:   Assessment and Plan: This patient is a 67 year old female with a history of presumed bipolar disorder but primarily seems to have depression and anxiety.  She is doing well on her current regimen.  She will continue Prozac 20 mg daily for depression, Depakote 750 mg at bedtime for mood stabilization, Xanax 0.5 mg twice daily  and 1 mg at bedtime and Restoril 30 mg at bedtime as needed for sleep.  She will return to see me in 3 months   [name], MD 06/24/2018, 11:49 AM

## 2018-06-26 ENCOUNTER — Other Ambulatory Visit: Payer: Self-pay

## 2018-06-26 DIAGNOSIS — G894 Chronic pain syndrome: Secondary | ICD-10-CM

## 2018-06-28 ENCOUNTER — Other Ambulatory Visit: Payer: Self-pay | Admitting: Orthopedic Surgery

## 2018-06-28 DIAGNOSIS — G894 Chronic pain syndrome: Secondary | ICD-10-CM

## 2018-06-28 NOTE — Telephone Encounter (Signed)
duplicate

## 2018-07-03 ENCOUNTER — Other Ambulatory Visit (HOSPITAL_COMMUNITY): Payer: Self-pay | Admitting: Internal Medicine

## 2018-07-03 DIAGNOSIS — Z1231 Encounter for screening mammogram for malignant neoplasm of breast: Secondary | ICD-10-CM

## 2018-07-04 ENCOUNTER — Other Ambulatory Visit: Payer: Self-pay

## 2018-07-04 DIAGNOSIS — G894 Chronic pain syndrome: Secondary | ICD-10-CM

## 2018-07-04 MED ORDER — OXYCODONE-ACETAMINOPHEN 7.5-325 MG PO TABS: 1.0000 | ORAL_TABLET | ORAL | 0 refills | Status: DC | PRN

## 2018-07-08 ENCOUNTER — Telehealth: Payer: Self-pay | Admitting: Radiology

## 2018-07-08 NOTE — Telephone Encounter (Signed)
Dr Sherwood Gambler gave her Tizanidine 4mg  tid for 10 days, she has asked you to refill it please advise

## 2018-07-08 NOTE — Telephone Encounter (Signed)
no

## 2018-07-09 NOTE — Telephone Encounter (Signed)
Advised pharmacy.

## 2018-07-11 ENCOUNTER — Ambulatory Visit (HOSPITAL_COMMUNITY)
Admission: RE | Admit: 2018-07-11 | Discharge: 2018-07-11 | Disposition: A | Discharge status: Home / Self Care | Payer: Medicare Other | Source: Ambulatory Visit | Attending: Internal Medicine | Admitting: Internal Medicine

## 2018-07-11 ENCOUNTER — Other Ambulatory Visit: Payer: Self-pay

## 2018-07-11 DIAGNOSIS — Z1231 Encounter for screening mammogram for malignant neoplasm of breast: Secondary | ICD-10-CM | POA: Insufficient documentation

## 2018-07-11 DIAGNOSIS — N6325 Unspecified lump in the left breast, overlapping quadrants: Secondary | ICD-10-CM | POA: Diagnosis not present

## 2018-07-11 DIAGNOSIS — G894 Chronic pain syndrome: Secondary | ICD-10-CM

## 2018-07-11 MED ORDER — OXYCODONE-ACETAMINOPHEN 7.5-325 MG PO TABS: 1.0000 | ORAL_TABLET | ORAL | 0 refills | Status: DC | PRN

## 2018-07-18 ENCOUNTER — Other Ambulatory Visit: Payer: Self-pay

## 2018-07-18 DIAGNOSIS — G894 Chronic pain syndrome: Secondary | ICD-10-CM

## 2018-07-19 MED ORDER — OXYCODONE-ACETAMINOPHEN 7.5-325 MG PO TABS: 1.0000 | ORAL_TABLET | ORAL | 0 refills | Status: DC | PRN

## 2018-07-23 ENCOUNTER — Other Ambulatory Visit (HOSPITAL_COMMUNITY): Payer: Self-pay | Admitting: Family Medicine

## 2018-07-23 DIAGNOSIS — N63 Unspecified lump in unspecified breast: Secondary | ICD-10-CM

## 2018-07-24 ENCOUNTER — Ambulatory Visit (INDEPENDENT_AMBULATORY_CARE_PROVIDER_SITE_OTHER): Payer: Medicare Other | Admitting: Orthopedic Surgery

## 2018-07-24 ENCOUNTER — Ambulatory Visit (INDEPENDENT_AMBULATORY_CARE_PROVIDER_SITE_OTHER): Payer: Medicare Other

## 2018-07-24 VITALS — BP 183/98 | HR 76 | Ht 63.0 in | Wt 258.0 lb

## 2018-07-24 DIAGNOSIS — Z96651 Presence of right artificial knee joint: Secondary | ICD-10-CM

## 2018-07-24 DIAGNOSIS — G894 Chronic pain syndrome: Secondary | ICD-10-CM

## 2018-07-24 MED ORDER — OXYCODONE-ACETAMINOPHEN 7.5-325 MG PO TABS: 1.0000 | ORAL_TABLET | ORAL | 0 refills | Status: DC | PRN

## 2018-07-24 NOTE — Progress Notes (Signed)
Routine OFFICE VISIT  Chief Complaint  Patient presents with  . Follow-up    Recheck on right knee replacement, DOS 07-01-12.    67 chronic pain syndrome status post right total knee status post patellar revision comes in complaining of right leg giving way history of lumbar degenerative disc disease     Review of Systems  Constitutional: Negative for chills, fever, malaise/fatigue and weight loss.  Musculoskeletal: Positive for back pain.  Neurological: Positive for weakness.     Past Medical History:  Diagnosis Date  . Allergic rhinitis   . Anxiety   . Arthritis   . Cataracts, bilateral   . Chronic back pain    Sees pain mamagement clinic  . Depression   . GERD (gastroesophageal reflux disease)   . Headaches, cluster 3 yrs ago   last one  . High cholesterol   . Hypertension   . Hypothyroid   . Immature cataract   . Mania (HCC)   . PTSD (post-traumatic stress disorder)   . Seizures (HCC) 20 yrs ago   unknown etiology-on meds  . Sleep apnea    STOP BANG score=4  . 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[facility])     Past Surgical History:  Procedure Laterality Date  . BACK SURGERY    . CATARACT EXTRACTION W/PHACO Left 01/04/2016   Procedure: CATARACT EXTRACTION PHACO AND INTRAOCULAR LENS PLACEMENT (IOC);  Surgeon: Jethro Bolus, MD;  Location: [facility];  Service: Ophthalmology;  Laterality: Left;  CDE:4.70  . CATARACT EXTRACTION W/PHACO Right 01/18/2016   Procedure: CATARACT EXTRACTION PHACO AND INTRAOCULAR LENS PLACEMENT RIGHT EYE; CDE:  4.66;  Surgeon: Jethro Bolus, MD;  Location: [facility];  Service: Ophthalmology;  Laterality: Right;  . CHOLECYSTECTOMY    . COLONOSCOPY  10/16/2008   ZOX:[id number], otherwise normal rectum; pancolonic diverticula the remainder of the colonic mucosa appeared normal. Next TCS due 10/2013.  Marland Kitchen COLONOSCOPY  06/28/2006   RMR: Internal hemorrhoids. Diminutive rectal polyps, cold biopsied/ Pancolonic diverticula. Polyps in the right colon removed   .  COLONOSCOPY WITH PROPOFOL N/A 05/20/2015   RMR: Pancolonic diverticulosis. Redundant colon   . ESOPHAGOGASTRODUODENOSCOPY  10/16/2008   XBJ:YNWGN hiatal hernia otherwise normal esophagus, stomach  D1, D2, status post passage of a 56-French Maloney dilator  . ESOPHAGOGASTRODUODENOSCOPY  06/28/2006   FAO:ZHYQMV esophagus. Small hiatal hernia. Otherwise normal stomach, D1 and D2, status post passage of a 56 [place] Maloney dilator  . ESOPHAGOGASTRODUODENOSCOPY (EGD) WITH ESOPHAGEAL DILATION N/A 07/14/2013   RMR: Abnormal esophagus of uncertain significance-status post esophageal biospy. small hiatal hernia.   Marland Kitchen ESOPHAGOGASTRODUODENOSCOPY (EGD) WITH PROPOFOL N/A 05/20/2015   RMR: NORMAL EGD status post maloney dilation   . FOOT SURGERY     left foot-  . INCISION AND DRAINAGE Right 01/03/2013   Procedure: INCISION AND DRAINAGE;  Surgeon: Vickki Hearing, MD;  Location: [facility];  Service: Orthopedics;  Laterality: Right;  . KNEE ARTHROSCOPY  01/2012   right  . KNEE ARTHROSCOPY WITH LATERAL RELEASE Right 01/03/2013   Procedure: Lateral Release Patella Right Knee;  Surgeon: Vickki Hearing, MD;  Location: [facility];  Service: Orthopedics;  Laterality: Right;  . left foot  x 2  . LUMBAR DISC SURGERY  L4-5  . MALONEY DILATION N/A 05/20/2015   Procedure: Elease Hashimoto DILATION;  Surgeon: Corbin Ade, MD;  Location: [facility];  Service: Endoscopy;  Laterality: N/A;  Maloney dilator # 54  . oophoectomy  bilateral  . PARTIAL HYSTERECTOMY    . TOTAL KNEE ARTHROPLASTY  07/01/2012  Procedure: TOTAL KNEE ARTHROPLASTY;  Surgeon: Vickki HearingStanley E Sabreena Vogan, MD;  Location: [facility];  Service: Orthopedics;  Laterality: Right;  . TOTAL KNEE REVISION Right 01/03/2013   Procedure: Patellaplasty Right Knee;  Surgeon: Vickki HearingStanley E Lizza Huffaker, MD;  Location: [facility];  Service: Orthopedics;  Laterality: Right;    Family History  Problem Relation Age of Onset  . Alcohol abuse Mother   . Anxiety disorder Mother   . Alcohol abuse Father   .  Anxiety disorder Father   . Bipolar disorder Sister   . Alcohol abuse Brother   . Anxiety disorder Brother   . Drug abuse Brother   . Dementia Paternal Aunt   . ADD / ADHD Grandchild   . ADD / ADHD Grandchild   . Drug abuse Brother   . Alcohol abuse Brother   . Anxiety disorder Brother   . Seizures Brother   . Alcohol abuse Brother   . Anxiety disorder Brother   . Seizures Brother   . Alcohol abuse Brother   . Anxiety disorder Brother   . Alcohol abuse Brother   . Anxiety disorder Brother   . Heart disease Unknown   . Diabetes Unknown   . Alcohol abuse Unknown   . Depression Neg Hx   . OCD Neg Hx   . Paranoid behavior Neg Hx   . Schizophrenia Neg Hx   . Sexual abuse Neg Hx   . Physical abuse Neg Hx   . Colon cancer Neg Hx    Social History   Tobacco Use  . Smoking status: Former Smoker    Packs/day: 0.25    Years: 5.00    Pack years: 1.25    Types: Cigarettes    Last attempt to quit: 06/26/2003    Years since quitting: 15.0  . Smokeless tobacco: Never Used  Substance Use Topics  . Alcohol use: Yes    Alcohol/week: 0.0 standard drinks    Comment: occasionally; once a month.  . Drug use: No    Comment: has a past history of street drug use      No outpatient medications have been marked as taking for the 07/24/18 encounter (Office Visit) with Vickki HearingHarrison, Japheth Diekman E, MD.    BP (!) 183/98   Pulse 76   Ht 5\' 3"  (1.6 m)   Wt 258 lb (117 kg)   BMI 45.70 kg/m   Physical Exam  Constitutional: She appears well-developed and well-nourished. She appears distressed.  HENT:  Head: Normocephalic and atraumatic.  Neurological: She exhibits normal muscle tone. Gait abnormal. Coordination normal.  Right leg limp   Skin: She is not diaphoretic.  Psychiatric: Her behavior is normal. Judgment and thought content normal.    Right Knee Exam   Muscle Strength  The patient has normal right knee strength.  Tenderness  Right knee tenderness location: diffuse.  Range  of Motion  Extension: 0  Flexion: 100   Tests  Drawer:  Anterior - negative    Posterior - negative  Other  Swelling: none   Left Knee Exam   Muscle Strength  The patient has normal left knee strength.  Tenderness  The patient is experiencing no tenderness.   Range of Motion  Extension: normal   Other  Swelling: none        MEDICAL DECISION SECTION  [facility]  My independent reading of xrays:  X-rays are normal right knee prosthesis in good position see report  Encounter Diagnoses  Name Primary?  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 Chronic pain syndrome     PLAN: (Rx., injectx, surgery, frx, mri/ct)  I see no issues with her knee or knee replacement she does have bilateral leg weakness complains of inability to stand more than 15 minutes status post lumbar discectomy by Dr. Jeral Fruit  I will get her a referral back to him for further evaluation and treatment or his group can do it Meds ordered this encounter  Medications  . oxyCODONE-acetaminophen (PERCOCET) 7.5-325 MG tablet    Sig: Take 1 tablet by mouth every 4 (four) hours as needed for up to 7 days for severe pain.    Dispense:  42 tablet    Refill:  0    Meds ordered this encounter  Medications  . oxyCODONE-acetaminophen (PERCOCET) 7.5-325 MG tablet    Sig: Take 1 tablet by mouth every 4 (four) hours as needed for up to 7 days for severe pain.    Dispense:  42 tablet    Refill:  0    Fuller Canada, MD  07/24/2018 5:55 PM

## 2018-07-25 ENCOUNTER — Other Ambulatory Visit (HOSPITAL_COMMUNITY): Payer: Self-pay | Admitting: Family Medicine

## 2018-07-25 ENCOUNTER — Telehealth: Payer: Self-pay | Admitting: Radiology

## 2018-07-25 DIAGNOSIS — R928 Other abnormal and inconclusive findings on diagnostic imaging of breast: Secondary | ICD-10-CM

## 2018-07-25 DIAGNOSIS — M5136 Other intervertebral disc degeneration, lumbar region: Secondary | ICD-10-CM

## 2018-07-25 DIAGNOSIS — N63 Unspecified lump in unspecified breast: Secondary | ICD-10-CM

## 2018-07-25 NOTE — Telephone Encounter (Signed)
-----   Message from Vickki HearingStanley E Harrison, MD sent at 07/24/2018  5:56 PM EST ----- Call Diane RattanIris [name]  Tell her that Dr. Jeral FruitBotero did her back surgery  We will refer her back to his group

## 2018-07-30 ENCOUNTER — Ambulatory Visit (HOSPITAL_COMMUNITY)
Admission: RE | Admit: 2018-07-30 | Discharge: 2018-07-30 | Disposition: A | Discharge status: Home / Self Care | Payer: Medicare Other | Source: Ambulatory Visit | Attending: Family Medicine | Admitting: Family Medicine

## 2018-07-30 ENCOUNTER — Ambulatory Visit (HOSPITAL_COMMUNITY): Payer: Medicare Other

## 2018-07-30 DIAGNOSIS — N6320 Unspecified lump in the left breast, unspecified quadrant: Secondary | ICD-10-CM | POA: Insufficient documentation

## 2018-07-30 DIAGNOSIS — Z1231 Encounter for screening mammogram for malignant neoplasm of breast: Secondary | ICD-10-CM | POA: Diagnosis not present

## 2018-07-30 DIAGNOSIS — N6489 Other specified disorders of breast: Secondary | ICD-10-CM | POA: Diagnosis not present

## 2018-07-30 DIAGNOSIS — N63 Unspecified lump in unspecified breast: Secondary | ICD-10-CM | POA: Diagnosis present

## 2018-07-30 DIAGNOSIS — R928 Other abnormal and inconclusive findings on diagnostic imaging of breast: Secondary | ICD-10-CM | POA: Diagnosis not present

## 2018-07-31 ENCOUNTER — Other Ambulatory Visit: Payer: Self-pay

## 2018-07-31 DIAGNOSIS — G894 Chronic pain syndrome: Secondary | ICD-10-CM

## 2018-07-31 MED ORDER — OXYCODONE-ACETAMINOPHEN 7.5-325 MG PO TABS: 1.0000 | ORAL_TABLET | ORAL | 0 refills | Status: DC | PRN

## 2018-08-05 DIAGNOSIS — H26493 Other secondary cataract, bilateral: Secondary | ICD-10-CM | POA: Diagnosis not present

## 2018-08-05 DIAGNOSIS — H16223 Keratoconjunctivitis sicca, not specified as Sjogren's, bilateral: Secondary | ICD-10-CM | POA: Diagnosis not present

## 2018-08-05 DIAGNOSIS — H59033 Cystoid macular edema following cataract surgery, bilateral: Secondary | ICD-10-CM | POA: Diagnosis not present

## 2018-08-05 DIAGNOSIS — Z961 Presence of intraocular lens: Secondary | ICD-10-CM | POA: Diagnosis not present

## 2018-08-06 ENCOUNTER — Telehealth: Payer: Self-pay | Admitting: Orthopedic Surgery

## 2018-08-06 NOTE — Telephone Encounter (Signed)
Rene KocherRegina at [facility] left message on voicemail stating that speaking with the patient found this referral is for pain management. Patient has been seen for pain management before and the referral is being work on with [employer].  Questions please call [phone]

## 2018-08-08 ENCOUNTER — Other Ambulatory Visit: Payer: Self-pay

## 2018-08-08 DIAGNOSIS — G894 Chronic pain syndrome: Secondary | ICD-10-CM

## 2018-08-13 MED ORDER — OXYCODONE-ACETAMINOPHEN 7.5-325 MG PO TABS: 1.0000 | ORAL_TABLET | ORAL | 0 refills | Status: DC | PRN

## 2018-08-19 ENCOUNTER — Other Ambulatory Visit: Payer: Self-pay

## 2018-08-19 DIAGNOSIS — G894 Chronic pain syndrome: Secondary | ICD-10-CM

## 2018-08-20 ENCOUNTER — Encounter: Payer: Self-pay | Admitting: Gastroenterology

## 2018-08-20 ENCOUNTER — Ambulatory Visit (INDEPENDENT_AMBULATORY_CARE_PROVIDER_SITE_OTHER): Payer: Medicare Other | Admitting: Gastroenterology

## 2018-08-20 VITALS — BP 151/83 | HR 85 | Temp 97.1°F | Ht 63.0 in | Wt 254.8 lb

## 2018-08-20 DIAGNOSIS — K5900 Constipation, unspecified: Secondary | ICD-10-CM | POA: Diagnosis not present

## 2018-08-20 MED ORDER — OXYCODONE-ACETAMINOPHEN 7.5-325 MG PO TABS: 1.0000 | ORAL_TABLET | ORAL | 0 refills | Status: DC | PRN

## 2018-08-20 MED ORDER — PLECANATIDE 3 MG PO TABS: 3.0000 mg | ORAL_TABLET | Freq: Every day | ORAL | 1 refills | Status: DC

## 2018-08-20 NOTE — Progress Notes (Signed)
Primary Care Physician: Elfredia Nevins, MD  Primary Gastroenterologist:  Roetta Sessions, MD   Chief Complaint  Patient presents with  . Constipation    has not had BM in 3 days so far this week. Normally still takes her 1-2 weeks to have a BM    HPI: Diane [name] is a 67 y.o. female here for follow up. She last seen in May.  She has a history of GERD and constipation.  History of colonic adenomas, next colonoscopy due in 2021.  Previously has tried Linzess 290 mcg, Amitiza 24 mcg twice daily for her constipation without great results.  Reporting only 1 good bowel movement 1 month.  Takes oxycodone several times per week.  Rectal exam unremarkable at the last office visit.  She was started on Symproic to take on days that she takes her narcotic in addition to her Linzess daily.   Patient tells me she takes more pain medication on cold days, rainy days, up to 4 times daily.  Even on current bowel regimen she has a bowel movement about once every 1 to 2 weeks.  Stool is hard and she has to strain.  Has abdominal discomfort related to constipation.  No melena or rectal bleeding.  No vomiting.  No upper GI symptoms at this time.    Current Outpatient Medications  Medication Sig Dispense Refill  . ALPRAZolam (XANAX) 0.5 MG tablet TAKE ONE TABLET BY MOUTH TWICE DAILY AND TAKE TWO (2) TABLETS AT BEDTIME 120 tablet 2  . amLODipine (NORVASC) 5 MG tablet Take 5 mg by mouth daily.    Marland Kitchen atorvastatin (LIPITOR) 20 MG tablet Take 20 mg by mouth daily.  1  . cyclobenzaprine (FLEXERIL) 10 MG tablet TAKE ONE TABLET BY MOUTH THREE TIMES DAILY AS NEEDED FOR MUSCLE SPASMS. 90 tablet 5  . divalproex (DEPAKOTE) 250 MG DR tablet Take 3 tablets (750 mg total) by mouth at bedtime. 90 tablet 2  . FLUoxetine (PROZAC) 20 MG capsule Take 1 capsule (20 mg total) by mouth daily. 30 capsule 2  . hydrocortisone (ANUSOL-HC) 2.5 % rectal cream Place 1 application rectally 2 (two) times daily. (Patient taking  differently: Place 1 application rectally as needed. ) 30 g 1  . levothyroxine (SYNTHROID, LEVOTHROID) 150 MCG tablet Take 150 mcg by mouth daily.  5  . LINZESS 290 MCG CAPS capsule TAKE ONE CAPSULE BY MOUTH DAILY BEFORE BREAKFAST. (Patient taking differently: Take 290 mcg by mouth 2 (two) times daily. ) 90 capsule 3  . lisinopril (PRINIVIL,ZESTRIL) 10 MG tablet Take 10 mg by mouth daily.    . meloxicam (MOBIC) 7.5 MG tablet TAKE ONE TABLET BY MOUTH DAILY 30 tablet 5  . oxybutynin (DITROPAN-XL) 5 MG 24 hr tablet Take 5 mg by mouth at bedtime.    Marland Kitchen oxyCODONE-acetaminophen (PERCOCET) 7.5-325 MG tablet Take 1 tablet by mouth every 4 (four) hours as needed for up to 7 days for severe pain. 42 tablet 0  . pantoprazole (PROTONIX) 40 MG tablet TAKE ONE TABLET BY MOUTH TWICE DAILY. 60 tablet 5  . SYMPROIC 0.2 MG TABS TAKE ONE TABLET BY MOUTH DAILY ON THE DAYS YOU TAKE PAIN MEDICATION 30 tablet 3  . temazepam (RESTORIL) 30 MG capsule Take 1 capsule (30 mg total) by mouth at bedtime as needed. for sleep 30 capsule 2   No current facility-administered medications for this visit.     Allergies as of 08/20/2018 - Review Complete 08/20/2018  Allergen Reaction Noted  .  Neomycin-bacitracin zn-polymyx Other (See Comments) 02/27/2008  . Penicillins Hives   . Sulfonamide derivatives Hives and Swelling 02/27/2008    ROS:  General: Negative for anorexia, weight loss, fever, chills, fatigue, weakness. ENT: Negative for hoarseness, difficulty swallowing , nasal congestion. CV: Negative for chest pain, angina, palpitations, dyspnea on exertion, peripheral edema.  Respiratory: Negative for dyspnea at rest, dyspnea on exertion, cough, sputum, wheezing.  GI: See history of present illness. GU:  Negative for dysuria, hematuria, urinary incontinence, urinary frequency, nocturnal urination.  Endo: Negative for unusual weight change.    Physical Examination:   BP (!) 151/83   Pulse 85   Temp (!) 97.1 F (36.2  C) (Oral)   Ht 5\' 3"  (1.6 m)   Wt 254 lb 12.8 oz (115.6 kg)   BMI 45.14 kg/m   General: Well-nourished, well-developed in no acute distress.  Eyes: No icterus. Mouth: Oropharyngeal mucosa moist and pink , no lesions erythema or exudate. Lungs: Clear to auscultation bilaterally.  Heart: Regular rate and rhythm, no murmurs rubs or gallops.  Abdomen: Bowel sounds are normal, nontender, nondistended, no hepatosplenomegaly or masses, no abdominal bruits or hernia , no rebound or guarding.   Extremities: No lower extremity edema. No clubbing or deformities. Neuro: Alert and oriented x 4   Skin: Warm and dry, no jaundice.   Psych: Alert and cooperative, normal mood and affect.  Labs:  Lab Results  Component Value Date   CREATININE 0.87 02/11/2018   BUN 19 02/11/2018   NA 141 02/11/2018   K 4.0 02/11/2018   CL 105 02/11/2018   CO2 30 02/11/2018   Lab Results  Component Value Date   ALT 12 02/11/2018   AST 13 02/11/2018   ALKPHOS 63 01/30/2017   BILITOT 0.4 02/11/2018   Lab Results  Component Value Date   WBC 4.5 03/26/2018   HGB 12.0 03/26/2018   HCT 36.5 03/26/2018   MCV 85.7 03/26/2018   PLT 171 03/26/2018   Lab Results  Component Value Date   TSH 0.52 02/11/2018    Imaging Studies: [place] Breast Ltd Uni Left Inc Axilla  Result Date: 07/30/2018 CLINICAL DATA:  67 year old female with a left breast palpable abnormality. EXAM: DIGITAL DIAGNOSTIC BILATERAL MAMMOGRAM WITH CAD AND TOMO LEFT BREAST ULTRASOUND COMPARISON:  Previous exam(s). ACR Breast Density Category b: There are scattered areas of fibroglandular density. FINDINGS: No suspicious masses or calcifications are seen in either breast. Spot compression tangential tomograms were performed over the palpable area of concern in the left breast with no abnormalities identified. Mammographic images were processed with CAD. Physical examination at site of palpable concern reveals linear oriented thickening corresponding to the  inframammary fold. No underlying palpable masses. Targeted ultrasound of the left breast was performed. No suspicious masses or abnormalities. IMPRESSION: 1.  No findings of malignancy in either breast. 2. No abnormalities identified at site of palpable concern in the lower left breast. The palpable abnormality is likely related to the inframammary fold in this location. RECOMMENDATION: 1. Recommend further management of the left breast palpable abnormality be based on clinical assessment. 2.  Screening mammogram in one year.(Code:SM-B-01Y) I have discussed the findings and recommendations with the patient. Results were also provided in writing at the conclusion of the visit. If applicable, a reminder letter will be sent to the patient regarding the next appointment. BI-RADS CATEGORY  1: Negative. Electronically Signed   By: Edwin CapJennifer  Jarosz M.D.   On: 07/30/2018 15:52   Mm Diag Breast Tomo Bilateral  Result Date: 07/30/2018 CLINICAL DATA:  67 year old female with a left breast palpable abnormality. EXAM: DIGITAL DIAGNOSTIC BILATERAL MAMMOGRAM WITH CAD AND TOMO LEFT BREAST ULTRASOUND COMPARISON:  Previous exam(s). ACR Breast Density Category b: There are scattered areas of fibroglandular density. FINDINGS: No suspicious masses or calcifications are seen in either breast. Spot compression tangential tomograms were performed over the palpable area of concern in the left breast with no abnormalities identified. Mammographic images were processed with CAD. Physical examination at site of palpable concern reveals linear oriented thickening corresponding to the inframammary fold. No underlying palpable masses. Targeted ultrasound of the left breast was performed. No suspicious masses or abnormalities. IMPRESSION: 1.  No findings of malignancy in either breast. 2. No abnormalities identified at site of palpable concern in the lower left breast. The palpable abnormality is likely related to the inframammary fold in  this location. RECOMMENDATION: 1. Recommend further management of the left breast palpable abnormality be based on clinical assessment. 2.  Screening mammogram in one year.(Code:SM-B-01Y) I have discussed the findings and recommendations with the patient. Results were also provided in writing at the conclusion of the visit. If applicable, a reminder letter will be sent to the patient regarding the next appointment. BI-RADS CATEGORY  1: Negative. Electronically Signed   By: Edwin Cap M.D.   On: 07/30/2018 15:52   Dg Knee Ap/lat W/sunrise Right  Result Date: 07/24/2018 Radiology report 3 views RIGHT  knee AP lateral and patellar sunrise views of the knee FINDINGS: A total knee prosthesis is noted. It is in anatomic alignment. There is no evidence of loosening. Impression : normal TKA  xray

## 2018-08-20 NOTE — Patient Instructions (Signed)
1. Stop Linzess. Start Trulance once daily for constipation. Call if not well covered by your insurance.  2. Continue Symproic once daily on days you take your pain medication.  3. Call in two weeks and let me know how your constipation is. Our goal would be at least two bowel movements per week.  4. Return to the office in two months.

## 2018-08-22 DIAGNOSIS — Z96659 Presence of unspecified artificial knee joint: Secondary | ICD-10-CM | POA: Diagnosis not present

## 2018-08-22 DIAGNOSIS — G4733 Obstructive sleep apnea (adult) (pediatric): Secondary | ICD-10-CM | POA: Diagnosis not present

## 2018-08-22 NOTE — Assessment & Plan Note (Signed)
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  Recently with increased narcotic use.  Patient taking Symproic on days that she takes narcotics.  Also taking Linzess 290 mcg daily.  Having a bowel movement once every 1 to 2 weeks. Will have her stop Linzess but continue Symproic.  Add Trulance 3 mg daily.  Next step would be Motegrity if this regimen fails.  Patient to call with progress report in 2 weeks.  Return to the office in 2 months.

## 2018-08-22 NOTE — Progress Notes (Signed)
CC'D TO PCP °

## 2018-08-26 ENCOUNTER — Other Ambulatory Visit: Payer: Self-pay

## 2018-08-26 DIAGNOSIS — G894 Chronic pain syndrome: Secondary | ICD-10-CM

## 2018-08-26 MED ORDER — OXYCODONE-ACETAMINOPHEN 7.5-325 MG PO TABS: 1.0000 | ORAL_TABLET | ORAL | 0 refills | Status: DC | PRN

## 2018-08-27 ENCOUNTER — Telehealth: Payer: Self-pay | Admitting: Radiology

## 2018-08-27 NOTE — Telephone Encounter (Signed)
You referred patient to [facility], they wanted to schedule her again in the pain management area, as she had been seen by them most recently. They have called patient, and she has declined to schedule  I am closing referral, since patient has refused to schedule

## 2018-08-28 ENCOUNTER — Telehealth: Payer: Self-pay

## 2018-08-28 NOTE — Telephone Encounter (Signed)
PA for Trulance was started through [email or web address]. waiting on an approval or denial.

## 2018-08-29 ENCOUNTER — Telehealth: Payer: Self-pay | Admitting: Internal Medicine

## 2018-08-29 ENCOUNTER — Other Ambulatory Visit: Payer: Self-pay | Admitting: Orthopedic Surgery

## 2018-08-29 NOTE — Telephone Encounter (Signed)
[phone] PATIENT CALLED AND SAID SHE HAS NOT GONE TO THE BATHROOM AND SHE CAN NOT GET THE PRESCRIPTION THAT WAS SENT IN FOR HER UNTIL TOMORROW.  PLEASE CALL HER

## 2018-08-29 NOTE — Telephone Encounter (Signed)
PA for Trulance was approved through [email or web address]. approval letter will be scanned in chart when received. Pt is aware of approval.

## 2018-08-29 NOTE — Telephone Encounter (Signed)
Ask her to continue Trulance. Continue Symproic on days she takes her pain medication.   Add Mirlax 17 grams po TID until a bowel movement, then continue once daily.

## 2018-08-29 NOTE — Telephone Encounter (Signed)
Pt started samples of the Trulance as directed on 08/20/18. Pt isn't to have two bowel movements as suggested. Pts last BM was 08/19/18. Pt hasn't taken any Miralax as of yet. Pt noticed that her food seems to come back up when she burps.

## 2018-08-30 NOTE — Telephone Encounter (Signed)
Pt notified of LSL recommendations.  

## 2018-08-30 NOTE — Telephone Encounter (Signed)
Lmom, waiting on a return call.  

## 2018-09-02 ENCOUNTER — Other Ambulatory Visit: Payer: Self-pay

## 2018-09-02 DIAGNOSIS — G4733 Obstructive sleep apnea (adult) (pediatric): Secondary | ICD-10-CM | POA: Diagnosis not present

## 2018-09-02 DIAGNOSIS — G894 Chronic pain syndrome: Secondary | ICD-10-CM

## 2018-09-06 MED ORDER — OXYCODONE-ACETAMINOPHEN 7.5-325 MG PO TABS: 1.0000 | ORAL_TABLET | ORAL | 0 refills | Status: DC | PRN

## 2018-09-07 ENCOUNTER — Other Ambulatory Visit: Payer: Self-pay | Admitting: Orthopedic Surgery

## 2018-09-07 DIAGNOSIS — S8002XA Contusion of left knee, initial encounter: Secondary | ICD-10-CM

## 2018-09-07 DIAGNOSIS — M25562 Pain in left knee: Secondary | ICD-10-CM

## 2018-09-11 ENCOUNTER — Other Ambulatory Visit: Payer: Self-pay

## 2018-09-11 DIAGNOSIS — G894 Chronic pain syndrome: Secondary | ICD-10-CM

## 2018-09-13 MED ORDER — OXYCODONE-ACETAMINOPHEN 7.5-325 MG PO TABS: 1.0000 | ORAL_TABLET | ORAL | 0 refills | Status: DC | PRN

## 2018-09-20 ENCOUNTER — Other Ambulatory Visit: Payer: Self-pay | Admitting: Orthopedic Surgery

## 2018-09-20 DIAGNOSIS — G894 Chronic pain syndrome: Secondary | ICD-10-CM

## 2018-09-24 ENCOUNTER — Encounter (HOSPITAL_COMMUNITY): Payer: Self-pay | Admitting: Psychiatry

## 2018-09-24 ENCOUNTER — Ambulatory Visit (INDEPENDENT_AMBULATORY_CARE_PROVIDER_SITE_OTHER): Payer: Medicare Other | Admitting: Psychiatry

## 2018-09-24 DIAGNOSIS — F5105 Insomnia due to other mental disorder: Secondary | ICD-10-CM | POA: Diagnosis not present

## 2018-09-24 DIAGNOSIS — F3130 Bipolar disorder, current episode depressed, mild or moderate severity, unspecified: Secondary | ICD-10-CM | POA: Diagnosis not present

## 2018-09-24 MED ORDER — DIVALPROEX SODIUM 250 MG PO DR TAB: 750.0000 mg | DELAYED_RELEASE_TABLET | Freq: Every day | ORAL | 2 refills | Status: DC

## 2018-09-24 MED ORDER — ALPRAZOLAM 0.5 MG PO TABS: ORAL_TABLET | ORAL | 2 refills | Status: DC

## 2018-09-24 MED ORDER — TRAZODONE HCL 100 MG PO TABS: 100.0000 mg | ORAL_TABLET | Freq: Every day | ORAL | 2 refills | Status: DC

## 2018-09-24 MED ORDER — FLUOXETINE HCL 20 MG PO CAPS: 20.0000 mg | ORAL_CAPSULE | Freq: Every day | ORAL | 2 refills | Status: DC

## 2018-09-24 NOTE — Progress Notes (Signed)
BH MD/PA/NP OP Progress Note  09/24/2018 11:24 AM Diane [name]  MRN:  [id number]  Chief Complaint:  Chief Complaint    Depression; Anxiety; Follow-up     HPI: This patient is a 68 year old black female with a history of bipolar disorder who comes in for follow-up today.  She is on disability and living with her boyfriend in [place].  The patient returns after 3 months.  She states that she is not feeling well and has had a lot of pain in her back that sounds like sciatica.  She has had past back surgery.  She is being referred to [facility] orthopedics for pain management.  She is not sleeping well and does not think that the temazepam is working.  She is somewhat depressed and anxious but she thinks most of this is due to the pain.  She denies suicidal ideation.  I suggested we start with trying to improve her sleep by changing her medication at bedtime to trazodone and she agrees. 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 Bipolar I disorder, most recent episode depressed (HCC) F31.30 divalproex (DEPAKOTE) 250 MG DR tablet    Valproic Acid level    Past Psychiatric History: She has a history of one [facility] in [facility].  At the time she was using drugs and alcohol.  She has had outpatient treatment ever since  Past Medical History:  Past Medical History:  Diagnosis Date  . Allergic rhinitis   . Anxiety   . Arthritis   . Cataracts, bilateral   . Chronic back pain    Sees pain mamagement clinic  . Depression   . GERD (gastroesophageal reflux disease)   . Headaches, cluster 3 yrs ago   last one  . High cholesterol   . Hypertension   . Hypothyroid   . Immature cataract   . Mania (HCC)   . PTSD (post-traumatic stress disorder)   . Seizures (HCC) 20 yrs ago   unknown etiology-on meds  . Sleep apnea    STOP BANG score=4  . 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 BACK SURGERY    . CATARACT EXTRACTION W/PHACO Left 01/04/2016   Procedure: CATARACT EXTRACTION PHACO AND INTRAOCULAR LENS PLACEMENT (IOC);  Surgeon: Jethro Bolus, MD;  Location: [facility];  Service: Ophthalmology;  Laterality: Left;  CDE:4.70  . CATARACT EXTRACTION W/PHACO Right 01/18/2016   Procedure: CATARACT EXTRACTION PHACO AND INTRAOCULAR LENS PLACEMENT RIGHT EYE; CDE:  4.66;  Surgeon: Jethro Bolus, MD;  Location: [facility];  Service: Ophthalmology;  Laterality: Right;  . CHOLECYSTECTOMY    . COLONOSCOPY  10/16/2008   VBT:YOMAYOKHTXH, otherwise normal rectum; pancolonic diverticula the remainder of the colonic mucosa appeared normal. Next TCS due 10/2013.  Marland Kitchen COLONOSCOPY  06/28/2006   RMR: Internal hemorrhoids. Diminutive rectal polyps, cold biopsied/ Pancolonic diverticula. Polyps in the right colon removed   . COLONOSCOPY WITH PROPOFOL N/A 05/20/2015   RMR: Pancolonic diverticulosis. Redundant colon   . ESOPHAGOGASTRODUODENOSCOPY  10/16/2008   FSF:SELTR hiatal hernia otherwise normal esophagus, stomach  D1, D2, status post passage of a 56-French Maloney dilator  . ESOPHAGOGASTRODUODENOSCOPY  06/28/2006   VUY:EBXIDH esophagus. Small hiatal hernia. Otherwise normal stomach, D1 and D2, status post passage of a 56 [place] Maloney dilator  . ESOPHAGOGASTRODUODENOSCOPY (EGD) WITH ESOPHAGEAL DILATION N/A 07/14/2013   RMR: Abnormal  esophagus of uncertain significance-status post esophageal biospy. small hiatal hernia.   Marland Kitchen ESOPHAGOGASTRODUODENOSCOPY (EGD) WITH PROPOFOL N/A 05/20/2015   RMR: NORMAL EGD status post maloney dilation   . FOOT SURGERY     left foot-  . INCISION AND DRAINAGE Right 01/03/2013   Procedure: INCISION AND DRAINAGE;  Surgeon: Vickki Hearing, MD;  Location: [facility];  Service: Orthopedics;  Laterality: Right;  . KNEE ARTHROSCOPY  01/2012   right  . KNEE ARTHROSCOPY WITH LATERAL RELEASE Right 01/03/2013   Procedure: Lateral Release Patella  Right Knee;  Surgeon: Vickki Hearing, MD;  Location: [facility];  Service: Orthopedics;  Laterality: Right;  . left foot  x 2  . LUMBAR DISC SURGERY  L4-5  . MALONEY DILATION N/A 05/20/2015   Procedure: Elease Hashimoto DILATION;  Surgeon: Corbin Ade, MD;  Location: [facility];  Service: Endoscopy;  Laterality: N/A;  Maloney dilator # 54  . oophoectomy  bilateral  . PARTIAL HYSTERECTOMY    . TOTAL KNEE ARTHROPLASTY  07/01/2012   Procedure: TOTAL KNEE ARTHROPLASTY;  Surgeon: Vickki Hearing, MD;  Location: [facility];  Service: Orthopedics;  Laterality: Right;  . TOTAL KNEE REVISION Right 01/03/2013   Procedure: Patellaplasty Right Knee;  Surgeon: Vickki Hearing, MD;  Location: [facility];  Service: Orthopedics;  Laterality: Right;    Family Psychiatric History: See below  Family History:  Family History  Problem Relation Age of Onset  . Alcohol abuse Mother   . Anxiety disorder Mother   . Alcohol abuse Father   . Anxiety disorder Father   . Bipolar disorder Sister   . Alcohol abuse Brother   . Anxiety disorder Brother   . Drug abuse Brother   . Dementia Paternal Aunt   . ADD / ADHD Grandchild   . ADD / ADHD Grandchild   . Drug abuse Brother   . Alcohol abuse Brother   . Anxiety disorder Brother   . Seizures Brother   . Alcohol abuse Brother   . Anxiety disorder Brother   . Seizures Brother   . Alcohol abuse Brother   . Anxiety disorder Brother   . Alcohol abuse Brother   . Anxiety disorder Brother   . Heart disease Unknown   . Diabetes Unknown   . Alcohol abuse Unknown   . Depression Neg Hx   . OCD Neg Hx   . Paranoid behavior Neg Hx   . Schizophrenia Neg Hx   . Sexual abuse Neg Hx   . Physical abuse Neg Hx   . Colon cancer Neg Hx     Social History:  Social History   Socioeconomic History  . Marital status: Married    Spouse name: Not on file  . Number of children: Not on file  . Years of education: 12th grade  . Highest education level: Not on file  Occupational  History  . Occupation: disabled    Associate Professor: UNEMPLOYED  Social Needs  . Financial resource strain: Not on file  . Food insecurity:    Worry: Not on file    Inability: Not on file  . Transportation needs:    Medical: Not on file    Non-medical: Not on file  Tobacco Use  . Smoking status: Former Smoker    Packs/day: 0.25    Years: 5.00    Pack years: 1.25    Types: Cigarettes    Last attempt to quit: 06/26/2003    Years since quitting: 15.2  . Smokeless tobacco: Never Used  Substance and Sexual Activity  . Alcohol use: Yes    Alcohol/week: 0.0 standard drinks    Comment: occasionally; once a month.  . Drug use: No    Comment: has a past history of street drug use  . Sexual activity: Never    Birth control/protection: Surgical  Lifestyle  . Physical activity:    Days per week: Not on file    Minutes per session: Not on file  . Stress: Not on file  Relationships  . Social connections:    Talks on phone: Not on file    Gets together: Not on file    Attends religious service: Not on file    Active member of club or organization: Not on file    Attends meetings of clubs or organizations: Not on file    Relationship status: Not on file  Other Topics Concern  . Not on file  Social History Narrative  . Not on file    Allergies:   Metabolic Disorder Labs: Lab Results  Component Value Date   HGBA1C 5.8 (H) 01/25/2012   MPG 120 (H) 01/25/2012   No results found for: PROLACTIN Lab Results  Component Value Date   CHOL  12/10/2010    69        ATP III CLASSIFICATION:  <200     mg/dL   Desirable  [id number]  mg/dL   Borderline High  >=956>=240    mg/dL   High          TRIG 38 12/10/2010   HDL 28 (L) 12/10/2010   CHOLHDL 2.5 12/10/2010   VLDL 8 12/10/2010   LDLCALC  12/10/2010    33        Total Cholesterol/HDL:CHD Risk Coronary Heart Disease Risk Table                     Men   Women  1/2 Average Risk   3.4   3.3  Average Risk       5.0   4.4  2 X Average Risk    9.6   7.1  3 X Average Risk  23.4   11.0        Use the calculated Patient Ratio above and the CHD Risk Table to determine the patient's CHD Risk.        ATP III CLASSIFICATION (LDL):  <100     mg/dL   Optimal  [id number]  mg/dL   Near or Above                    Optimal  130-159  mg/dL   Borderline  [id number]  mg/dL   High  >629>190     mg/dL   Very High   LDLCALC 71 07/12/2009   Lab Results  Component Value Date   TSH 0.52 02/11/2018   TSH <0.008 (L) 12/09/2010    Therapeutic Level Labs: No results found for: LITHIUM Lab Results  Component Value Date   VALPROATE 71.5 01/30/2017   VALPROATE 24.0 (L) 07/10/2014   No components found for:  CBMZ  Current Medications: Current Outpatient Medications  Medication Sig Dispense Refill  . ALPRAZolam (XANAX) 0.5 MG tablet TAKE ONE TABLET BY MOUTH TWICE DAILY AND TAKE TWO (2) TABLETS AT BEDTIME 120 tablet 2  . amLODipine (NORVASC) 5 MG tablet Take 5 mg by mouth daily.    Marland Kitchen. atorvastatin (LIPITOR) 20 MG tablet Take 20 mg by mouth daily.  1  . cyclobenzaprine (FLEXERIL) 10  MG tablet TAKE ONE TABLET BY MOUTH THREE TIMES DAILY AS NEEDED FOR MUSCLE SPASMS. 90 tablet 5  . divalproex (DEPAKOTE) 250 MG DR tablet Take 3 tablets (750 mg total) by mouth at bedtime. 90 tablet 2  . FLUoxetine (PROZAC) 20 MG capsule Take 1 capsule (20 mg total) by mouth daily. 30 capsule 2  . hydrocortisone (ANUSOL-HC) 2.5 % rectal cream Place 1 application rectally 2 (two) times daily. (Patient taking differently: Place 1 application rectally as needed. ) 30 g 1  . levothyroxine (SYNTHROID, LEVOTHROID) 150 MCG tablet Take 150 mcg by mouth daily.  5  . lisinopril (PRINIVIL,ZESTRIL) 10 MG tablet Take 10 mg by mouth daily.    . meloxicam (MOBIC) 7.5 MG tablet TAKE ONE TABLET BY MOUTH DAILY 30 tablet 5  . oxybutynin (DITROPAN-XL) 5 MG 24 hr tablet Take 5 mg by mouth at bedtime.    Marland Kitchen. oxyCODONE-acetaminophen (PERCOCET) 7.5-325 MG tablet Take 1 tablet by mouth every 4 (four)  hours as needed for up to 7 days for severe pain. 42 tablet 0  . pantoprazole (PROTONIX) 40 MG tablet TAKE ONE TABLET BY MOUTH TWICE DAILY. 60 tablet 5  . Plecanatide (TRULANCE) 3 MG TABS Take 3 mg by mouth daily. 30 tablet 1  . SYMPROIC 0.2 MG TABS TAKE ONE TABLET BY MOUTH DAILY ON THE DAYS YOU TAKE PAIN MEDICATION 30 tablet 3  . traZODone (DESYREL) 100 MG tablet Take 1 tablet (100 mg total) by mouth at bedtime. 30 tablet 2   No current facility-administered medications for this visit.      Musculoskeletal: Strength & Muscle Tone: within normal limits Gait & Station: normal Patient leans: N/A  Psychiatric Specialty Exam: Review of Systems  Constitutional: Positive for malaise/fatigue.  Musculoskeletal: Positive for back pain and joint pain.  Psychiatric/Behavioral: The patient has insomnia.   All other systems reviewed and are negative.   Blood pressure 136/88, pulse 80, height 5\' 3"  (1.6 m), weight 253 lb 9.6 oz (115 kg), SpO2 93 %.Body mass index is 44.92 kg/m.  General Appearance: Casual and Fairly Groomed  Eye Contact:  Good  Speech:  Clear and Coherent  Volume:  Decreased  Mood:  Dysphoric  Affect:  Constricted and Flat  Thought Process:  Goal Directed  Orientation:  Full (Time, Place, and Person)  Thought Content: Rumination   Suicidal Thoughts:  No  Homicidal Thoughts:  No  Memory:  Immediate;   Good Recent;   Good Remote;   Fair  Judgement:  Fair  Insight:  Fair  Psychomotor Activity:  Decreased  Concentration:  Concentration: Good and Attention Span: Good  Recall:  Good  Fund of Knowledge: Fair  Language: Good  Akathisia:  No  Handed:  Right  AIMS (if indicated): not done  Assets:  Communication Skills Desire for Improvement Resilience Social Support Talents/Skills  ADL's:  Intact  Cognition: WNL  Sleep:  Poor   Screenings:   Assessment and Plan: Patient is a 68 year old female with a history of depression and anxiety.  She is currently  experiencing a good deal of chronic back pain.  She is not able to sleep.  She will discontinue Restoril and start trazodone 100 mg at bedtime for sleep.  For now she will continue Prozac 20 mg daily for depression and Xanax 0.5 mg twice daily and 1 mg at bedtime.  We will check a Depakote level as it has not yet been checked this year.  Her recent liver function tests were all within normal limits.  She will return to see me in 3 months   [name], MD 09/24/2018, 11:24 AM

## 2018-09-25 DIAGNOSIS — G4733 Obstructive sleep apnea (adult) (pediatric): Secondary | ICD-10-CM | POA: Diagnosis not present

## 2018-09-25 LAB — VALPROIC ACID LEVEL: Valproic Acid Lvl: 70.7 mg/L (ref 50.0–100.0)

## 2018-09-26 ENCOUNTER — Other Ambulatory Visit: Payer: Self-pay

## 2018-09-26 DIAGNOSIS — G894 Chronic pain syndrome: Secondary | ICD-10-CM

## 2018-09-27 MED ORDER — OXYCODONE-ACETAMINOPHEN 7.5-325 MG PO TABS: 1.0000 | ORAL_TABLET | ORAL | 0 refills | Status: DC | PRN

## 2018-10-03 ENCOUNTER — Other Ambulatory Visit: Payer: Self-pay

## 2018-10-03 DIAGNOSIS — G894 Chronic pain syndrome: Secondary | ICD-10-CM

## 2018-10-03 DIAGNOSIS — H40023 Open angle with borderline findings, high risk, bilateral: Secondary | ICD-10-CM | POA: Diagnosis not present

## 2018-10-03 DIAGNOSIS — H26493 Other secondary cataract, bilateral: Secondary | ICD-10-CM | POA: Diagnosis not present

## 2018-10-03 MED ORDER — OXYCODONE-ACETAMINOPHEN 7.5-325 MG PO TABS: 1.0000 | ORAL_TABLET | ORAL | 0 refills | Status: DC | PRN

## 2018-10-07 ENCOUNTER — Telehealth (HOSPITAL_COMMUNITY): Payer: Self-pay | Admitting: *Deleted

## 2018-10-07 ENCOUNTER — Telehealth: Payer: Self-pay | Admitting: Orthopedic Surgery

## 2018-10-07 ENCOUNTER — Other Ambulatory Visit (HOSPITAL_COMMUNITY): Payer: Self-pay | Admitting: Psychiatry

## 2018-10-07 ENCOUNTER — Other Ambulatory Visit: Payer: Self-pay | Admitting: Gastroenterology

## 2018-10-07 DIAGNOSIS — K219 Gastro-esophageal reflux disease without esophagitis: Secondary | ICD-10-CM

## 2018-10-07 MED ORDER — ZOLPIDEM TARTRATE 10 MG PO TABS: 10.0000 mg | ORAL_TABLET | Freq: Every evening | ORAL | 2 refills | Status: DC | PRN

## 2018-10-07 NOTE — Telephone Encounter (Signed)
I did call the patient and gave her the name and number of [facility].

## 2018-10-07 NOTE — Telephone Encounter (Signed)
Tell her Ambien 10 mg sent in. It has worked in the past

## 2018-10-07 NOTE — Telephone Encounter (Signed)
Dr Tenny Craw Patient called LVM @ [facility]. When I called she said the Trazodone isn't working. & she's not sleeping. She said she takes it 1 hr before she thinks she will be going to sleep & end up not sleeping @ all.

## 2018-10-07 NOTE — Telephone Encounter (Signed)
Patient called stating she has an appointment in [place] on 2/19. She was asking for the number in [place]. Stated that someone from our office called her, but I didn't see any note of this in her chart since 08/27/18.  Please call and advise  [phone]

## 2018-10-07 NOTE — Telephone Encounter (Signed)
We did refer to [facility] 336 [phone]

## 2018-10-10 ENCOUNTER — Telehealth: Payer: Self-pay

## 2018-10-10 NOTE — Telephone Encounter (Signed)
PA was submitted through [email or web address] for Symproic .2mg . waiting on approval or denial.

## 2018-10-11 ENCOUNTER — Other Ambulatory Visit: Payer: Self-pay

## 2018-10-11 DIAGNOSIS — G894 Chronic pain syndrome: Secondary | ICD-10-CM

## 2018-10-14 DIAGNOSIS — H40023 Open angle with borderline findings, high risk, bilateral: Secondary | ICD-10-CM | POA: Diagnosis not present

## 2018-10-14 NOTE — Telephone Encounter (Signed)
PA for Symporic was denied. Pt has to try and fell Constulose, Laculose, Enulore,generlac or Relistor. Pt is taking  Trulance and Syoroic on the days she takes her medication. Please advise on medication.

## 2018-10-15 ENCOUNTER — Other Ambulatory Visit: Payer: Self-pay | Admitting: Orthopedic Surgery

## 2018-10-15 DIAGNOSIS — G894 Chronic pain syndrome: Secondary | ICD-10-CM

## 2018-10-15 MED ORDER — OXYCODONE-ACETAMINOPHEN 7.5-325 MG PO TABS: 1.0000 | ORAL_TABLET | ORAL | 0 refills | Status: DC | PRN

## 2018-10-15 NOTE — Telephone Encounter (Signed)
Patient of Dr. Mort Sawyers requests refill on Oxycodone 7.5-325  Mgs.  Qty 42  Sig: Take 1 tablet by mouth every 4 (four) hours as needed for up to 7 days for severe pain.  Patient states she uses [facility] in [place]

## 2018-10-17 ENCOUNTER — Other Ambulatory Visit: Payer: Self-pay

## 2018-10-17 DIAGNOSIS — G894 Chronic pain syndrome: Secondary | ICD-10-CM

## 2018-10-17 MED ORDER — METHYLNALTREXONE BROMIDE 150 MG PO TABS: 450.0000 mg | ORAL_TABLET | Freq: Every day | ORAL | 0 refills | Status: DC | PRN

## 2018-10-17 NOTE — Addendum Note (Signed)
Addended by: Tiffany KocherLEWIS, Chianne Byrns S on: 10/17/2018 10:02 PM   Modules accepted: Orders

## 2018-10-17 NOTE — Telephone Encounter (Signed)
Continue Trulance daily. Miralax up to bid prn. And Relistor on days she takes her pain medication. rx sent to pharmacy.

## 2018-10-18 NOTE — Telephone Encounter (Signed)
Pt notified of LSL recommendations.

## 2018-10-21 ENCOUNTER — Other Ambulatory Visit: Payer: Self-pay | Admitting: Orthopedic Surgery

## 2018-10-21 DIAGNOSIS — G894 Chronic pain syndrome: Secondary | ICD-10-CM

## 2018-10-21 MED ORDER — OXYCODONE-ACETAMINOPHEN 7.5-325 MG PO TABS: 1.0000 | ORAL_TABLET | ORAL | 0 refills | Status: DC | PRN

## 2018-10-21 NOTE — Telephone Encounter (Signed)
PA for Relistor was received and completed through [email or web address]. waiting on an approval or denial.

## 2018-10-23 NOTE — Telephone Encounter (Signed)
PA for Relistor was approved through [email or web address] through 04/2019. Lmom, pt is aware of approval. Lm with pts pharmacy, they are aware of her approval.

## 2018-10-24 ENCOUNTER — Other Ambulatory Visit: Payer: Self-pay

## 2018-10-24 DIAGNOSIS — G894 Chronic pain syndrome: Secondary | ICD-10-CM

## 2018-10-28 ENCOUNTER — Emergency Department (HOSPITAL_COMMUNITY): Payer: Medicare Other

## 2018-10-28 ENCOUNTER — Emergency Department (HOSPITAL_COMMUNITY)
Admission: EM | Admit: 2018-10-28 | Discharge: 2018-10-28 | Disposition: A | Discharge status: Home / Self Care | Payer: Medicare Other | Attending: Emergency Medicine | Admitting: Emergency Medicine

## 2018-10-28 ENCOUNTER — Other Ambulatory Visit: Payer: Self-pay

## 2018-10-28 ENCOUNTER — Encounter (HOSPITAL_COMMUNITY): Payer: Self-pay

## 2018-10-28 DIAGNOSIS — Z96651 Presence of right artificial knee joint: Secondary | ICD-10-CM | POA: Insufficient documentation

## 2018-10-28 DIAGNOSIS — M549 Dorsalgia, unspecified: Secondary | ICD-10-CM | POA: Diagnosis not present

## 2018-10-28 DIAGNOSIS — R079 Chest pain, unspecified: Secondary | ICD-10-CM | POA: Diagnosis not present

## 2018-10-28 DIAGNOSIS — S40021A Contusion of right upper arm, initial encounter: Secondary | ICD-10-CM | POA: Diagnosis not present

## 2018-10-28 DIAGNOSIS — Y92000 Kitchen of unspecified non-institutional (private) residence as  the place of occurrence of the external cause: Secondary | ICD-10-CM | POA: Diagnosis not present

## 2018-10-28 DIAGNOSIS — R109 Unspecified abdominal pain: Secondary | ICD-10-CM | POA: Diagnosis not present

## 2018-10-28 DIAGNOSIS — Y92009 Unspecified place in unspecified non-institutional (private) residence as the place of occurrence of the external cause: Secondary | ICD-10-CM

## 2018-10-28 DIAGNOSIS — S8991XA Unspecified injury of right lower leg, initial encounter: Secondary | ICD-10-CM | POA: Diagnosis not present

## 2018-10-28 DIAGNOSIS — S3991XA Unspecified injury of abdomen, initial encounter: Secondary | ICD-10-CM | POA: Diagnosis not present

## 2018-10-28 DIAGNOSIS — Z87891 Personal history of nicotine dependence: Secondary | ICD-10-CM | POA: Insufficient documentation

## 2018-10-28 DIAGNOSIS — Z79899 Other long term (current) drug therapy: Secondary | ICD-10-CM | POA: Insufficient documentation

## 2018-10-28 DIAGNOSIS — M25561 Pain in right knee: Secondary | ICD-10-CM | POA: Diagnosis not present

## 2018-10-28 DIAGNOSIS — S79911A Unspecified injury of right hip, initial encounter: Secondary | ICD-10-CM | POA: Diagnosis not present

## 2018-10-28 DIAGNOSIS — W010XXA Fall on same level from slipping, tripping and stumbling without subsequent striking against object, initial encounter: Secondary | ICD-10-CM | POA: Insufficient documentation

## 2018-10-28 DIAGNOSIS — Y999 Unspecified external cause status: Secondary | ICD-10-CM | POA: Insufficient documentation

## 2018-10-28 DIAGNOSIS — I10 Essential (primary) hypertension: Secondary | ICD-10-CM | POA: Insufficient documentation

## 2018-10-28 DIAGNOSIS — M25551 Pain in right hip: Secondary | ICD-10-CM | POA: Diagnosis not present

## 2018-10-28 DIAGNOSIS — R1084 Generalized abdominal pain: Secondary | ICD-10-CM | POA: Diagnosis not present

## 2018-10-28 DIAGNOSIS — S3992XA Unspecified injury of lower back, initial encounter: Secondary | ICD-10-CM | POA: Diagnosis not present

## 2018-10-28 DIAGNOSIS — M545 Low back pain: Secondary | ICD-10-CM | POA: Diagnosis not present

## 2018-10-28 DIAGNOSIS — G8929 Other chronic pain: Secondary | ICD-10-CM | POA: Diagnosis not present

## 2018-10-28 DIAGNOSIS — E039 Hypothyroidism, unspecified: Secondary | ICD-10-CM | POA: Insufficient documentation

## 2018-10-28 DIAGNOSIS — W19XXXA Unspecified fall, initial encounter: Secondary | ICD-10-CM

## 2018-10-28 HISTORY — DX: Radiculopathy, lumbar region: M54.16

## 2018-10-28 HISTORY — DX: Constipation, unspecified: K59.00

## 2018-10-28 HISTORY — DX: Other abnormalities of gait and mobility: R26.89

## 2018-10-28 HISTORY — DX: Chronic pain syndrome: G89.4

## 2018-10-28 LAB — CBC WITH DIFFERENTIAL/PLATELET
Abs Immature Granulocytes: 0.01 10*3/uL (ref 0.00–0.07)
Basophils Absolute: 0 10*3/uL (ref 0.0–0.1)
Basophils Relative: 0 %
Eosinophils Absolute: 0 10*3/uL (ref 0.0–0.5)
Eosinophils Relative: 1 %
HCT: 45.3 % (ref 36.0–46.0)
Hemoglobin: 13.8 g/dL (ref 12.0–15.0)
Immature Granulocytes: 0 %
Lymphocytes Relative: 35 %
Lymphs Abs: 2.1 10*3/uL (ref 0.7–4.0)
MCH: 27.1 pg (ref 26.0–34.0)
MCHC: 30.5 g/dL (ref 30.0–36.0)
MCV: 89 fL (ref 80.0–100.0)
Monocytes Absolute: 0.6 10*3/uL (ref 0.1–1.0)
Monocytes Relative: 9 %
Neutro Abs: 3.4 10*3/uL (ref 1.7–7.7)
Neutrophils Relative %: 55 %
Platelets: 210 10*3/uL (ref 150–400)
RBC: 5.09 MIL/uL (ref 3.87–5.11)
RDW: 13.1 % (ref 11.5–15.5)
WBC: 6.2 10*3/uL (ref 4.0–10.5)
nRBC: 0 % (ref 0.0–0.2)

## 2018-10-28 LAB — COMPREHENSIVE METABOLIC PANEL
ALT: 27 U/L (ref 0–44)
AST: 22 U/L (ref 15–41)
Albumin: 4.1 g/dL (ref 3.5–5.0)
Alkaline Phosphatase: 54 U/L (ref 38–126)
Anion gap: 9 (ref 5–15)
BUN: 12 mg/dL (ref 8–23)
CO2: 26 mmol/L (ref 22–32)
Calcium: 9.1 mg/dL (ref 8.9–10.3)
Chloride: 102 mmol/L (ref 98–111)
Creatinine, Ser: 0.88 mg/dL (ref 0.44–1.00)
GFR calc Af Amer: >60 mL/min (ref >60)
GFR calc non Af Amer: >60 mL/min (ref >60)
Glucose, Bld: 106 mg/dL — ABNORMAL HIGH (ref 70–99)
Potassium: 3.7 mmol/L (ref 3.5–5.1)
Sodium: 137 mmol/L (ref 135–145)
Total Bilirubin: 0.9 mg/dL (ref 0.3–1.2)
Total Protein: 8.3 g/dL — ABNORMAL HIGH (ref 6.5–8.1)

## 2018-10-28 LAB — URINALYSIS, ROUTINE W REFLEX MICROSCOPIC
Bilirubin Urine: NEGATIVE
Glucose, UA: NEGATIVE mg/dL
Hgb urine dipstick: NEGATIVE
Ketones, ur: 5 mg/dL — AB
Leukocytes,Ua: NEGATIVE
Nitrite: NEGATIVE
Protein, ur: NEGATIVE mg/dL
Specific Gravity, Urine: 1.015 (ref 1.005–1.030)
pH: 6 (ref 5.0–8.0)

## 2018-10-28 LAB — MAGNESIUM: Magnesium: 2.2 mg/dL (ref 1.7–2.4)

## 2018-10-28 LAB — TROPONIN I: Troponin I: <0.03 ng/mL (ref <0.03)

## 2018-10-28 LAB — LIPASE, BLOOD: Lipase: 19 U/L (ref 11–51)

## 2018-10-28 MED ORDER — OXYCODONE-ACETAMINOPHEN 7.5-325 MG PO TABS: 1.0000 | ORAL_TABLET | ORAL | 0 refills | Status: DC | PRN

## 2018-10-28 MED ORDER — IOHEXOL 300 MG/ML  SOLN
100.0000 mL | Freq: Once | INTRAMUSCULAR | Status: AC | PRN
  Administered 2018-10-28: 100 mL via INTRAVENOUS

## 2018-10-28 MED ORDER — METHOCARBAMOL 750 MG PO TABS: 750.0000 mg | ORAL_TABLET | Freq: Three times a day (TID) | ORAL | 0 refills | Status: DC | PRN

## 2018-10-28 NOTE — ED Triage Notes (Signed)
Pt reports that she tripped over broken tile in her kitchen last Thursday. Pt reports pain across abdomen, right upper arm bruising and right knee and leg pain

## 2018-10-28 NOTE — ED Notes (Signed)
Patient transported to CT 

## 2018-10-28 NOTE — Discharge Instructions (Signed)
Take the prescription as directed.  Apply moist heat or ice to the area(s) of discomfort, for 15 minutes at a time, several times per day for the next few days.  Do not fall asleep on a heating or ice pack.  Call your regular medical doctor and your Orthopedic doctor tomorrow to schedule a follow up appointment this week.  Return to the Emergency Department immediately if worsening.

## 2018-10-28 NOTE — ED Provider Notes (Signed)
EMERGENCY DEPARTMENT Provider Note   [id number]: [facility]    History   Chief Complaint Chief Complaint  Patient presents with  . Fall    HPI Diane [name] is a 68 y.o. female.      Fall     Pt was seen at 1855. Per pt, c/o gradual onset and persistence of constant acute flair of her chronic right knee "pains" since she fell 4 days ago. States she tripped over a broken tile in her kitchen and fell to her right side.  Pt also states she has developed generalized abd "pains" over the past few days. Pt denies hitting her abd when she fell. Describes the abd pain as "aching" with radiation into her lower back. Endorses hx of chronic right knee and LBP. Denies hitting head, no LOC, no AMS, no neck pain, no CP/palpitations, no SOB/cough, no N/V/D, no fevers, no rash, no focal motor weakness.    Past Medical History:  Diagnosis Date  . Allergic rhinitis   . Anxiety   . Arthritis   . Cataracts, bilateral   . Chronic back pain    Sees pain mamagement clinic  . Chronic pain syndrome   . Constipation   . Depression   . GERD (gastroesophageal reflux disease)   . Headaches, cluster 3 yrs ago   last one  . High cholesterol   . Hypertension   . Hypothyroid   . Immature cataract   . Limp    right leg  . Mania (HCC)   . PTSD (post-traumatic stress disorder)   . Right lumbar radiculopathy   . Seizures (HCC) 20 yrs ago   unknown etiology-on meds  . Sleep apnea    STOP BANG score=4  . 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[facility](HCC)     Patient Active Problem List   Diagnosis Date Noted  . Abdominal pain 08/09/2015  . Dysphagia, pharyngoesophageal phase   . H/O adenomatous polyp of colon 05/05/2015  . Constipation 02/02/2015  . Knee contusion 12/09/2013  . Esophageal dysphagia 07/02/2013  . Abdominal pain, epigastric 07/02/2013  . Chronic pain syndrome 11/28/2012  . 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 Cellulitis 11/04/2012  .  Postoperative wound breakdown 11/04/2012  . 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 Knee pain 08/13/2012  . Hypokalemia 08/01/2012  . Cellulitis, wound, post-operative 07/30/2012  . Degenerative tear of medial meniscus of right knee 01/12/2012  . OA (osteoarthritis) of knee 01/12/2012  . Right knee sprain 11/02/2011  . Instability of knee joint 03/29/2011  . URTICARIA 12/21/2009  . OTHER URINARY INCONTINENCE 07/18/2009  . FOOT PAIN, LEFT 05/26/2009  . DERMATITIS, CHRONIC 10/21/2008  . IMPAIRED GLUCOSE TOLERANCE 09/09/2008  . BACK PAIN WITH RADICULOPATHY 09/09/2008  . HYPOTHYROIDISM 02/27/2008  . HYPERLIPIDEMIA 02/27/2008  . ANXIETY 02/27/2008  . Bipolar I disorder, most recent episode (or current) depressed, unspecified 02/27/2008  . HYPERTENSION 02/27/2008  . ALLERGIC RHINITIS 02/27/2008  . GERD 02/27/2008  . HEADACHE 02/27/2008    Past Surgical History:  Procedure Laterality Date  . BACK SURGERY    . CATARACT EXTRACTION W/PHACO Left 01/04/2016   Procedure: CATARACT EXTRACTION PHACO AND INTRAOCULAR LENS PLACEMENT (IOC);  Surgeon: Jethro BolusMark Shapiro, MD;  Location: [facility];  Service: Ophthalmology;  Laterality: Left;  CDE:4.70  . CATARACT EXTRACTION W/PHACO Right 01/18/2016   Procedure: CATARACT EXTRACTION PHACO AND INTRAOCULAR LENS PLACEMENT RIGHT EYE; CDE:  4.66;  Surgeon: Jethro BolusMark Shapiro, MD;  Location: [facility];  Service: Ophthalmology;  Laterality: Right;  . CHOLECYSTECTOMY    . COLONOSCOPY  10/16/2008   JOA:[id number], otherwise normal rectum; pancolonic diverticula the remainder of the colonic mucosa appeared normal. Next TCS due 10/2013.  Marland Kitchen COLONOSCOPY  06/28/2006   RMR: Internal hemorrhoids. Diminutive rectal polyps, cold biopsied/ Pancolonic diverticula. Polyps in the right colon removed   . COLONOSCOPY WITH PROPOFOL N/A 05/20/2015   RMR: Pancolonic diverticulosis. Redundant colon   . ESOPHAGOGASTRODUODENOSCOPY  10/16/2008   FUX:NATFT hiatal hernia otherwise normal  esophagus, stomach  D1, D2, status post passage of a 56-French Maloney dilator  . ESOPHAGOGASTRODUODENOSCOPY  06/28/2006   DDU:KGURKY esophagus. Small hiatal hernia. Otherwise normal stomach, D1 and D2, status post passage of a 56 [place] Maloney dilator  . ESOPHAGOGASTRODUODENOSCOPY (EGD) WITH ESOPHAGEAL DILATION N/A 07/14/2013   RMR: Abnormal esophagus of uncertain significance-status post esophageal biospy. small hiatal hernia.   Marland Kitchen ESOPHAGOGASTRODUODENOSCOPY (EGD) WITH PROPOFOL N/A 05/20/2015   RMR: NORMAL EGD status post maloney dilation   . FOOT SURGERY     left foot-  . INCISION AND DRAINAGE Right 01/03/2013   Procedure: INCISION AND DRAINAGE;  Surgeon: Vickki Hearing, MD;  Location: [facility];  Service: Orthopedics;  Laterality: Right;  . KNEE ARTHROSCOPY  01/2012   right  . KNEE ARTHROSCOPY WITH LATERAL RELEASE Right 01/03/2013   Procedure: Lateral Release Patella Right Knee;  Surgeon: Vickki Hearing, MD;  Location: [facility];  Service: Orthopedics;  Laterality: Right;  . left foot  x 2  . LUMBAR DISC SURGERY  L4-5  . MALONEY DILATION N/A 05/20/2015   Procedure: Elease Hashimoto DILATION;  Surgeon: Corbin Ade, MD;  Location: [facility];  Service: Endoscopy;  Laterality: N/A;  Maloney dilator # 54  . oophoectomy  bilateral  . PARTIAL HYSTERECTOMY    . TOTAL KNEE ARTHROPLASTY  07/01/2012   Procedure: TOTAL KNEE ARTHROPLASTY;  Surgeon: Vickki Hearing, MD;  Location: [facility];  Service: Orthopedics;  Laterality: Right;  . TOTAL KNEE REVISION Right 01/03/2013   Procedure: Patellaplasty Right Knee;  Surgeon: Vickki Hearing, MD;  Location: [facility];  Service: Orthopedics;  Laterality: Right;     OB History   No obstetric history on file.      Home Medications    Prior to Admission medications   Medication Sig Start Date End Date Taking? Authorizing Provider  ALPRAZolam Prudy Feeler) 0.5 MG tablet TAKE ONE TABLET BY MOUTH TWICE DAILY AND TAKE TWO (2) TABLETS AT BEDTIME 09/24/18   Myrlene Broker, MD   amLODipine (NORVASC) 5 MG tablet Take 5 mg by mouth daily.    [provider]  atorvastatin (LIPITOR) 20 MG tablet Take 20 mg by mouth daily. 03/30/17   [provider]  cyclobenzaprine (FLEXERIL) 10 MG tablet TAKE ONE TABLET BY MOUTH THREE TIMES DAILY AS NEEDED FOR MUSCLE SPASMS. 08/30/18   Vickki Hearing, MD  divalproex (DEPAKOTE) 250 MG DR tablet Take 3 tablets (750 mg total) by mouth at bedtime. 09/24/18   Myrlene Broker, MD  FLUoxetine (PROZAC) 20 MG capsule Take 1 capsule (20 mg total) by mouth daily. 09/24/18   Myrlene Broker, MD  hydrocortisone (ANUSOL-HC) 2.5 % rectal cream Place 1 application rectally 2 (two) times daily. Patient taking differently: Place 1 application rectally as needed.  02/06/18   Gelene Mink, NP  levothyroxine (SYNTHROID, LEVOTHROID) 150 MCG tablet Take 150 mcg by mouth daily. 03/29/17   [provider]  lisinopril (PRINIVIL,ZESTRIL) 10 MG tablet Take 10 mg by  mouth daily.    [provider]  meloxicam (MOBIC) 7.5 MG tablet TAKE ONE TABLET BY MOUTH DAILY 09/11/18   Vickki HearingHarrison, Stanley E, MD  Methylnaltrexone Bromide (RELISTOR) 150 MG TABS Take 450 mg by mouth daily as needed (for constipation). On days you take your pain medication. Take with water >30 minutes before breakfast 10/17/18   Tiffany KocherLewis, Leslie S, PA-C  oxybutynin (DITROPAN-XL) 5 MG 24 hr tablet Take 5 mg by mouth at bedtime.    [provider]  oxyCODONE-acetaminophen (PERCOCET) 7.5-325 MG tablet Take 1 tablet by mouth every 4 (four) hours as needed for up to 7 days for severe pain. 10/28/18 11/04/18  Vickki HearingHarrison, Stanley E, MD  pantoprazole (PROTONIX) 40 MG tablet TAKE ONE TABLET BY MOUTH TWICE DAILY. 10/08/18   Anice PaganiniGill, Eric A, NP  SYMPROIC 0.2 MG TABS TAKE ONE TABLET BY MOUTH DAILY ON THE DAYS YOU TAKE PAIN MEDICATION 10/08/18   Anice PaganiniGill, Eric A, NP  traZODone (DESYREL) 100 MG tablet Take 1 tablet (100 mg total) by mouth at bedtime. 09/24/18   Myrlene Brokeross, Deborah R, MD  TRULANCE 3 MG  TABS TAKE ONE TABLET BY MOUTH DAILY. 10/08/18   Anice PaganiniGill, Eric A, NP  zolpidem (AMBIEN) 10 MG tablet Take 1 tablet (10 mg total) by mouth at bedtime as needed for up to 30 days for sleep. 10/07/18 11/06/18  Myrlene Brokeross, Deborah R, MD    Family History Family History  Problem Relation Age of Onset  . Alcohol abuse Mother   . Anxiety disorder Mother   . Alcohol abuse Father   . Anxiety disorder Father   . Bipolar disorder Sister   . Alcohol abuse Brother   . Anxiety disorder Brother   . Drug abuse Brother   . Dementia Paternal Aunt   . ADD / ADHD Grandchild   . ADD / ADHD Grandchild   . Drug abuse Brother   . Alcohol abuse Brother   . Anxiety disorder Brother   . Seizures Brother   . Alcohol abuse Brother   . Anxiety disorder Brother   . Seizures Brother   . Alcohol abuse Brother   . Anxiety disorder Brother   . Alcohol abuse Brother   . Anxiety disorder Brother   . Heart disease Other   . Diabetes Other   . Alcohol abuse Other   . Depression Neg Hx   . OCD Neg Hx   . Paranoid behavior Neg Hx   . Schizophrenia Neg Hx   . Sexual abuse Neg Hx   . Physical abuse Neg Hx   . Colon cancer Neg Hx     Social History Social History   Tobacco Use  . Smoking status: Former Smoker    Packs/day: 0.25    Years: 5.00    Pack years: 1.25    Types: Cigarettes    Last attempt to quit: 06/26/2003    Years since quitting: 15.3  . Smokeless tobacco: Never Used  Substance Use Topics  . Alcohol use: Yes    Alcohol/week: 0.0 standard drinks    Comment: occasionally; once a month.  . Drug use: No    Comment: has a past history of street drug use     Allergies   Neomycin-bacitracin zn-polymyx; Penicillins; and Sulfonamide derivatives   Review of Systems Review of Systems ROS: Statement: All systems negative except as marked or noted in the HPI; Constitutional: Negative for fever and chills. ; ; Eyes: Negative for eye pain, redness and discharge. ; ; ENMT: Negative for  ear pain, hoarseness,  nasal congestion, sinus pressure and sore throat. ; ; Cardiovascular: Negative for chest pain, palpitations, diaphoresis, dyspnea and peripheral edema. ; ; Respiratory: Negative for cough, wheezing and stridor. ; ; Gastrointestinal: +abd pain. Negative for nausea, vomiting, diarrhea, blood in stool, hematemesis, jaundice and rectal bleeding. . ; ; Genitourinary: Negative for dysuria, flank pain and hematuria. ; ; Musculoskeletal: +right hip and knee pain. Negative for head injury and neck pain. Negative for swelling and deformity.; ; Skin: Negative for pruritus, rash, abrasions, blisters, bruising and skin lesion.; ; Neuro: Negative for headache, lightheadedness and neck stiffness. Negative for weakness, altered level of consciousness, altered mental status, extremity weakness, paresthesias, involuntary movement, seizure and syncope.       Physical Exam Updated Vital Signs BP 140/75 (BP Location: Right Arm)   Pulse 80   Temp 97.8 F (36.6 C) (Oral)   Resp 20   Ht 5\' 3"  (1.6 m)   Wt 113.4 kg   SpO2 95%   BMI 44.29 kg/m   Physical Exam 1900: Physical examination: Vital signs and O2 SAT: Reviewed; Constitutional: Well developed, Well nourished, Well hydrated, In no acute distress; Head and Face: Normocephalic, Atraumatic; Eyes: EOMI, PERRL, No scleral icterus; ENMT: Mouth and pharynx normal, Left TM normal, Right TM normal, Mucous membranes moist; Neck: Supple, Trachea midline. No abrasions or ecchymosis.; Spine: +TTP right lumbar paraspinal muscles. No midline CS, TS, LS tenderness.; Cardiovascular: Regular rate and rhythm, No gallop; Respiratory: Breath sounds clear & equal bilaterally, No wheezes, Normal respiratory effort/excursion; Chest: Nontender, No deformity, Movement normal, No crepitus, No abrasions or ecchymosis.; Abdomen: Soft, Nontender, Nondistended, Normal bowel sounds, No abrasions or ecchymosis.; Genitourinary: No CVA tenderness;; Extremities: Mild TTP right hip. +FROM right knee,  including able to lift extended right LE off stretcher, and extend right lower leg against resistance.  No ligamentous laxity.  No patellar or quad tendon step-offs.  NMS intact right foot, strong pedal pp. +plantarflexion of right foot w/calf squeeze.  No palpable gap right Achilles's tendon.  No proximal fibular head tenderness.  No edema, erythema, warmth, ecchymosis or deformity.  No specific area of point tenderness. Full range of motion major/large joints of bilat UE's and LE's without pain or tenderness to palp, Neurovascularly intact, Pulses normal, No deformity. No tenderness, No edema, Pelvis stable; Neuro: AA&Ox3, GCS 15.  Major CN grossly intact. No facial droop. Speech clear. No gross focal motor or sensory deficits in extremities. +limp right leg per baseline..; Skin: Color normal, Warm, Dry   ED Treatments / Results  Labs (all labs ordered are listed, but only abnormal results are displayed)   EKG EKG Interpretation  Date/Time:  Monday October 28 2018 19:46:38 EST Ventricular Rate:  77 PR Interval:    QRS Duration: 80 QT Interval:  580 QTC Calculation: 657 R Axis:   -12 Text Interpretation:  Sinus rhythm Low voltage, precordial leads Borderline T abnormalities, lateral leads Prolonged QT interval When compared with ECG of 05/19/2015 QT has lengthened Otherwise no significant change Confirmed by Samuel Jester [phone]) on 10/28/2018 7:53:53 PM   Radiology   Procedures Procedures (including critical care time)  Medications Ordered in ED Medications - No data to display   Initial Impression / Assessment and Plan / ED Course  I have reviewed the triage vital signs and the nursing notes.  Pertinent labs & imaging results that were available during my care of the patient were reviewed by me and considered in my medical decision making (see chart for  details).     MDM Reviewed: previous chart, nursing note and vitals Reviewed previous: labs and ECG Interpretation:  labs, ECG, x-ray and CT scan    Results for orders placed or performed during the hospital encounter of 10/28/18  Comprehensive metabolic panel  Result Value Ref Range   Sodium 137 135 - 145 mmol/L   Potassium 3.7 3.5 - 5.1 mmol/L   Chloride 102 98 - 111 mmol/L   CO2 26 22 - 32 mmol/L   Glucose, Bld 106 (H) 70 - 99 mg/dL   BUN 12 8 - 23 mg/dL   Creatinine, Ser 2.26 0.44 - 1.00 mg/dL   Calcium 9.1 8.9 - 33.3 mg/dL   Total Protein 8.3 (H) 6.5 - 8.1 g/dL   Albumin 4.1 3.5 - 5.0 g/dL   AST 22 15 - 41 U/L   ALT 27 0 - 44 U/L   Alkaline Phosphatase 54 38 - 126 U/L   Total Bilirubin 0.9 0.3 - 1.2 mg/dL   GFR calc non Af Amer >60 >60 mL/min   GFR calc Af Amer >60 >60 mL/min   Anion gap 9 5 - 15  Lipase, blood  Result Value Ref Range   Lipase 19 11 - 51 U/L  CBC with Differential  Result Value Ref Range   WBC 6.2 4.0 - 10.5 K/uL   RBC 5.09 3.87 - 5.11 MIL/uL   Hemoglobin 13.8 12.0 - 15.0 g/dL   HCT 54.5 62.5 - 63.8 %   MCV 89.0 80.0 - 100.0 fL   MCH 27.1 26.0 - 34.0 pg   MCHC 30.5 30.0 - 36.0 g/dL   RDW 93.7 34.2 - 87.6 %   Platelets 210 150 - 400 K/uL   nRBC 0.0 0.0 - 0.2 %   Neutrophils Relative % 55 %   Neutro Abs 3.4 1.7 - 7.7 K/uL   Lymphocytes Relative 35 %   Lymphs Abs 2.1 0.7 - 4.0 K/uL   Monocytes Relative 9 %   Monocytes Absolute 0.6 0.1 - 1.0 K/uL   Eosinophils Relative 1 %   Eosinophils Absolute 0.0 0.0 - 0.5 K/uL   Basophils Relative 0 %   Basophils Absolute 0.0 0.0 - 0.1 K/uL   Immature Granulocytes 0 %   Abs Immature Granulocytes 0.01 0.00 - 0.07 K/uL  Urinalysis, Routine w reflex microscopic  Result Value Ref Range   Color, Urine YELLOW YELLOW   APPearance HAZY (A) CLEAR   Specific Gravity, Urine 1.015 1.005 - 1.030   pH 6.0 5.0 - 8.0   Glucose, UA NEGATIVE NEGATIVE mg/dL   Hgb urine dipstick NEGATIVE NEGATIVE   Bilirubin Urine NEGATIVE NEGATIVE   Ketones, ur 5 (A) NEGATIVE mg/dL   Protein, ur NEGATIVE NEGATIVE mg/dL   Nitrite NEGATIVE NEGATIVE    Leukocytes,Ua NEGATIVE NEGATIVE  Troponin I - Once  Result Value Ref Range   Troponin I <0.03 <0.03 ng/mL  Magnesium  Result Value Ref Range   Magnesium 2.2 1.7 - 2.4 mg/dL   Dg Chest 2 View Result Date: 10/28/2018 CLINICAL DATA:  Pain after fall on Thursday. EXAM: CHEST - 2 VIEW COMPARISON:  07/31/2012 CXR FINDINGS: The heart size and mediastinal contours are within normal limits. Aortic atherosclerosis is noted without aneurysm. Both lungs are clear. The visualized skeletal structures are unremarkable. IMPRESSION: No active cardiopulmonary disease. Aortic atherosclerosis. Electronically Signed   By: Tollie Eth M.D.   On: 10/28/2018 21:35   Ct Abdomen Pelvis W Contrast Result Date: 10/28/2018 CLINICAL DATA:  Tripped and fell  last Thursday.  Abdominal pain. EXAM: CT ABDOMEN AND PELVIS WITH CONTRAST TECHNIQUE: Multidetector CT imaging of the abdomen and pelvis was performed using the standard protocol following bolus administration of intravenous contrast. CONTRAST:  OMNIPAQUE IOHEXOL 300 MG/ML  SOLN COMPARISON:  None. FINDINGS: Lower chest: The lung bases are clear. The heart is borderline enlarged. No pericardial effusion. No pleural effusion. Hepatobiliary: No focal hepatic lesions or intrahepatic biliary dilatation. No acute hepatic injury or perihepatic fluid collections. The gallbladder is surgically absent. No common bile duct dilatation. Pancreas: No mass, inflammation or ductal dilatation. Mild pancreatic atrophy. No acute injury or peri pancreatic fluid collection. Spleen: Normal size. No focal lesions. No acute injury or perisplenic fluid collections. Adrenals/Urinary Tract: Adrenal the adrenal glands and kidneys are unremarkable. No acute renal injury or perinephric fluid collections. The bladder appears normal. Stomach/Bowel: The stomach, duodenum, small bowel and colon are unremarkable. No acute inflammatory changes, mass lesions or obstructive findings. Colonic diverticulosis without  findings for acute diverticulitis. Vascular/Lymphatic: The aorta is normal in caliber. No dissection. The branch vessels are patent. The major venous structures are patent. No mesenteric or retroperitoneal mass or adenopathy. Small scattered lymph nodes are noted. Reproductive: The uterus is surgically absent. There is a left adnexal cyst which appears simple. It measures 4 cm. Ultrasound follow-up is suggested. I do not see the right ovary. Other: No ascites or abdominal wall hernia. No inguinal mass or adenopathy. Musculoskeletal: No significant bony findings. 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 IMPRESSION: 1. No acute abdominal/pelvic findings, mass lesions or lymphadenopathy. 2. Status post cholecystectomy.  No biliary dilatation. 3. Diffuse colonic diverticulosis without findings for acute diverticulitis. 4. 4 cm simple appearing left adnexal cyst. Disc based on its size and the patient's age I would recommend a follow-up pelvic ultrasound examination in 4-6 months. Electronically Signed   By: Rudie Meyer M.D.   On: 10/28/2018 21:26   Ct L-spine No Charge Result Date: 10/28/2018 CLINICAL DATA:  Fall last week.  Back pain.  Prior back surgery. EXAM: CT LUMBAR SPINE WITHOUT CONTRAST TECHNIQUE: Multidetector CT imaging of the lumbar spine was performed without intravenous contrast administration. Multiplanar CT image reconstructions were also generated. COMPARISON:  None. FINDINGS: Segmentation: 5 lumbar type vertebrae. Alignment: Normal. Vertebrae: No acute fracture or focal pathologic process. Paraspinal and other soft tissues: Please see report for dedicated CT of the abdomen and pelvis. Disc levels: There is ossification of the posterior longitudinal ligament at T11 and T12 levels, which causes mild spinal canal stenosis. L1-2: Partially calcified disc bulge with mild spinal canal narrowing. No neural foraminal stenosis. L2-3: Unremarkable. L3-4: Moderate facet  hypertrophy with mild spinal canal stenosis and mild bilateral foraminal stenosis, right worse than left. L4-5: Disc space narrowing with moderate facet hypertrophy. No spinal canal stenosis. Mild right and moderate-to-severe left neural foraminal stenosis. L5-S1: Severe facet hypertrophy, right worse left. Severe bilateral neural foraminal stenosis. No spinal canal stenosis. IMPRESSION: 1. No acute fracture or static subluxation of the lumbar spine. 2. Moderate-to-severe neural foraminal stenosis at left L4-5 and bilateral L5-S1. 3. 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 Electronically Signed   By: Deatra Robinson M.D.   On: 10/28/2018 21:29   Dg Knee Complete 4 Views Right Result Date: 10/28/2018 CLINICAL DATA:  Right knee pain after fall on Thursday. EXAM: RIGHT KNEE - COMPLETE 4+ VIEW COMPARISON:  None. FINDINGS: Intact total knee arthroplasty. No hardware failure, joint effusion  or dislocation. No periprosthetic fracture or suspicious osseous lesions. IMPRESSION: Intact total knee arthroplasty without hardware failure, joint effusion or dislocation. Electronically Signed   By: Tollie Eth M.D.   On: 10/28/2018 21:34   Dg Humerus Right Result Date: 10/28/2018 CLINICAL DATA:  Bruising along the lateral aspect of the right arm status post fall. EXAM: RIGHT HUMERUS - 2+ VIEW COMPARISON:  None. FINDINGS: There is no evidence of fracture or other focal bone lesions. Mild soft tissue induration and debris is noted along the lateral aspect of the right arm. Stigmata of remote lateral and medial epicondylitis with curvilinear ossifications noted off the epicondyles of the humerus. IMPRESSION: Soft tissue induration and debris along the lateral aspect of the right arm. No underlying acute osseous abnormality. Electronically Signed   By: Tollie Eth M.D.   On: 10/28/2018 21:36   Dg Hip Unilat With Pelvis 2-3 Views Right Result Date:  10/28/2018 CLINICAL DATA:  Right hip pain after fall on Thursday. EXAM: DG HIP (WITH OR WITHOUT PELVIS) 2-3V RIGHT COMPARISON:  None. FINDINGS: Mild disc space narrowing facet arthropathy of the included lower lumbar spine from L4 through S1. No pelvic diastasis or fracture. Mild osteoarthritic sclerosis of the pubic symphysis. The hip joints are maintained bilaterally. No fracture of the proximal femora. Enthesopathy off the lesser trochanters bilaterally. The urinary bladder is opacified by contrast. IMPRESSION: 1. No acute osseous abnormality of the pelvis and right hip. If there is pain out of proportion to radiographic findings, CT may help identified radiographically occult fractures. 2. Lower lumbar degenerative disc and facet arthropathy. Electronically Signed   By: Tollie Eth M.D.   On: 10/28/2018 21:33     2200:  Workup reassuring. Tx symptomatically at this time. Pt already has narcotic rx at home and has been referred to pain management. Dx and testing d/w pt and family.  Questions answered.  Verb understanding, agreeable to d/c home with outpt f/u.     Final Clinical Impressions(s) / ED Diagnoses   Final diagnoses:  Chronic back pain    ED Discharge Orders    None       Samuel Jester, DO 11/01/18 0825

## 2018-10-31 LAB — URINE CULTURE: Culture: >=100000 — AB

## 2018-11-01 ENCOUNTER — Telehealth: Payer: Self-pay | Admitting: Emergency Medicine

## 2018-11-01 NOTE — Telephone Encounter (Signed)
Post ED Visit - Positive Culture Follow-up  Culture report reviewed by antimicrobial stewardship pharmacist: [facility] []  Enzo Bi, Pharm.D. []  Celedonio Miyamoto, Pharm.D., BCPS AQ-ID []  Garvin Fila, Pharm.D., BCPS []  Georgina Pillion, Pharm.D., BCPS []  [place], [address].D., BCPS, AAHIVP []  Estella Husk, Pharm.D., BCPS, AAHIVP []  Lysle Pearl, PharmD, BCPS []  Phillips Climes, PharmD, BCPS [x]  Agapito Games, PharmD, BCPS []  Verlan Friends, PharmD []  Mervyn Gay, PharmD, BCPS []  Vinnie Level, PharmD  [facility] []  Len Childs, PharmD []  Greer Pickerel, PharmD []  Adalberto Cole, PharmD []  Perlie Gold, Rph []  Lonell Face) Jean Rosenthal, PharmD []  Earl Many, PharmD []  Junita Push, PharmD []  Dorna Leitz, PharmD []  Terrilee Files, PharmD []  Lynann Beaver, PharmD []  Keturah Barre, PharmD []  Loralee Pacas, PharmD []  Bernadene Person, PharmD   Positive urine culture No further patient follow-up is required at this time.  Carollee Herter Gaddiel Cullens 11/01/2018, 11:51 AM

## 2018-11-02 ENCOUNTER — Other Ambulatory Visit: Payer: Self-pay

## 2018-11-02 DIAGNOSIS — G894 Chronic pain syndrome: Secondary | ICD-10-CM

## 2018-11-04 MED ORDER — OXYCODONE-ACETAMINOPHEN 7.5-325 MG PO TABS: 1.0000 | ORAL_TABLET | ORAL | 0 refills | Status: DC | PRN

## 2018-11-07 ENCOUNTER — Other Ambulatory Visit: Payer: Self-pay

## 2018-11-07 DIAGNOSIS — M5442 Lumbago with sciatica, left side: Secondary | ICD-10-CM | POA: Diagnosis not present

## 2018-11-07 DIAGNOSIS — I10 Essential (primary) hypertension: Secondary | ICD-10-CM | POA: Diagnosis not present

## 2018-11-07 DIAGNOSIS — G894 Chronic pain syndrome: Secondary | ICD-10-CM

## 2018-11-08 ENCOUNTER — Other Ambulatory Visit: Payer: Self-pay | Admitting: Nurse Practitioner

## 2018-11-08 ENCOUNTER — Telehealth: Payer: Self-pay | Admitting: *Deleted

## 2018-11-08 ENCOUNTER — Other Ambulatory Visit (HOSPITAL_COMMUNITY): Payer: Self-pay | Admitting: Psychiatry

## 2018-11-08 ENCOUNTER — Other Ambulatory Visit: Payer: Self-pay | Admitting: *Deleted

## 2018-11-08 ENCOUNTER — Encounter: Payer: Self-pay | Admitting: *Deleted

## 2018-11-08 ENCOUNTER — Ambulatory Visit (INDEPENDENT_AMBULATORY_CARE_PROVIDER_SITE_OTHER): Payer: Medicare Other | Admitting: Gastroenterology

## 2018-11-08 ENCOUNTER — Encounter: Payer: Self-pay | Admitting: Internal Medicine

## 2018-11-08 ENCOUNTER — Telehealth: Payer: Self-pay | Admitting: Gastroenterology

## 2018-11-08 ENCOUNTER — Encounter: Payer: Self-pay | Admitting: Gastroenterology

## 2018-11-08 VITALS — BP 138/79 | HR 76 | Temp 97.4°F | Ht 63.0 in | Wt 247.4 lb

## 2018-11-08 DIAGNOSIS — R1310 Dysphagia, unspecified: Secondary | ICD-10-CM

## 2018-11-08 DIAGNOSIS — R1319 Other dysphagia: Secondary | ICD-10-CM

## 2018-11-08 DIAGNOSIS — Z780 Asymptomatic menopausal state: Secondary | ICD-10-CM

## 2018-11-08 DIAGNOSIS — N9489 Other specified conditions associated with female genital organs and menstrual cycle: Secondary | ICD-10-CM

## 2018-11-08 DIAGNOSIS — N958 Other specified menopausal and perimenopausal disorders: Secondary | ICD-10-CM | POA: Insufficient documentation

## 2018-11-08 DIAGNOSIS — K5900 Constipation, unspecified: Secondary | ICD-10-CM

## 2018-11-08 MED ORDER — OXYCODONE-ACETAMINOPHEN 7.5-325 MG PO TABS: 1.0000 | ORAL_TABLET | ORAL | 0 refills | Status: DC | PRN

## 2018-11-08 NOTE — Assessment & Plan Note (Signed)
Recurrent solid food esophageal dysphagia.  Responded well in the past esophageal dilation.  She also has breakthrough reflux symptoms.  Plan for EGD with esophageal dilation with deep sedation in the near future.  I have discussed the risks, alternatives, benefits with regards to but not limited to the risk of reaction to medication, bleeding, infection, perforation and the patient is agreeable to proceed. Written consent to be obtained.  Continue pantoprazole 40 mg twice daily.  Antireflux measures reinforced.  Chopped meats finely, consume moist meats.

## 2018-11-08 NOTE — Telephone Encounter (Signed)
SCHEDULED AND LETTER SENT  °

## 2018-11-08 NOTE — Telephone Encounter (Signed)
Patient needs return ov in four months, constipation/gerd/dysphagia

## 2018-11-08 NOTE — Telephone Encounter (Signed)
Pre-op scheduled for 12/18/2018 at 10:00am. Patient aware. Letter mailed

## 2018-11-08 NOTE — Assessment & Plan Note (Signed)
Doing much better with constipation.  Unfortunately we are not real sure what she is taking at this point.  We have requested updated medication list from [name].  Patient reports that she receives all of her medications from the [facility].  Further recommendations to follow.

## 2018-11-08 NOTE — Patient Outreach (Addendum)
[facility]) Care Management  11/08/2018  Diane [name] 02/21/1951 [id number]   Subjective: Telephone call to patient's home  / mobile number, no answer, left HIPAA compliant voicemail message, and requested call back.    Objective: Per KPN (Knowledge Performance Now, point of care tool) and chart review, patient had a ED visit on 10/28/2018 for fall.   Patient also has a history of hypertension, hyperlipidemia, hypothyroidism, PTSD,Chronic pain syndrome, Sleep apnea, Substance abuse in remission, and seizures.       Assessment: Received [employer] referral on 11/08/2018. Referral source: Anonette Louellen Molder.  Referral reason: Member may benefit from a walker and possible home health.  Patient was advised by referral source to speak with her primary MD / neurologist regarding home health and physical therapy.    Screening  follow up pending patient contact.     Plan: RNCM will send unsuccessful outreach letter, [facility] pamphlet, will call patient for 2nd telephone outreach attempt within 4 business days, transition of care follow up, and proceed with case closure, within 10 business days if no return call.     Diane [name] Barefoot, BSN, CCM [facility] Care Management [facility] Telephonic CM Phone: [phone] Fax: [phone]

## 2018-11-08 NOTE — Progress Notes (Signed)
Primary Care Physician: Elfredia Nevins, MD  Primary Gastroenterologist:  Roetta Sessions, MD   Chief Complaint  Patient presents with  . Constipation    ok    HPI: Diane [name] is a 68 y.o. female here for follow-up of GERD and constipation.  Last seen in December.  History of colonic adenomas, next colonoscopy due in 2021.  Previously on Linzess 290 mcg, Amitiza 24 mcg twice daily without good results.  Takes oxycodone several times per week.  After last office visit she was started on Trulance daily.  Advised to continue Symproic once daily on days she takes pain medication.  We received notice that Symproic was denied and she would have to try Relistor. We asked her to add MiraLAX 17 g 3 times daily until a bowel movement and then once daily.  She was approved for Relistor. Patient did not bring her medication list so we are not sure what she is taking.  She states Dr. Tenny Craw started her on a medication for constipation and she takes 3 at one time in the mornings. She states she couldn't understand why Dr. Tenny Craw would right her constipation medication.   Currently doing well. bm every day or every other day. No melena, brbrp. She is having more breakthrough reflux. She complains of recurrent solid food dysphagia. Sometimes food comes back up to get relief. Still on pantoprazole BID.  Previous esophageal dilation in 2016 seem to help.  Patient suffered recent fall.  She was seen in the ED 10/28/2018.  CT showed 4 cm simple appearing left adnexal cyst, recommending pelvic ultrasound in 4 to 6 months.  Otherwise no acute findings.    Current Outpatient Medications  Medication Sig Dispense Refill  . ALPRAZolam (XANAX) 0.5 MG tablet TAKE ONE TABLET BY MOUTH TWICE DAILY AND TAKE TWO (2) TABLETS AT BEDTIME 120 tablet 2  . amLODipine (NORVASC) 5 MG tablet Take 5 mg by mouth daily.    Marland Kitchen atorvastatin (LIPITOR) 20 MG tablet Take 20 mg by mouth daily.  1  . cyclobenzaprine (FLEXERIL) 10 MG tablet  TAKE ONE TABLET BY MOUTH THREE TIMES DAILY AS NEEDED FOR MUSCLE SPASMS. 90 tablet 5  . divalproex (DEPAKOTE) 250 MG DR tablet Take 3 tablets (750 mg total) by mouth at bedtime. 90 tablet 2  . FLUoxetine (PROZAC) 20 MG capsule Take 1 capsule (20 mg total) by mouth daily. 30 capsule 2  . hydrocortisone (ANUSOL-HC) 2.5 % rectal cream Place 1 application rectally 2 (two) times daily. (Patient taking differently: Place 1 application rectally as needed. ) 30 g 1  . levothyroxine (SYNTHROID, LEVOTHROID) 150 MCG tablet Take 150 mcg by mouth daily.  5  . lisinopril (PRINIVIL,ZESTRIL) 10 MG tablet Take 10 mg by mouth daily.    . meloxicam (MOBIC) 7.5 MG tablet TAKE ONE TABLET BY MOUTH DAILY 30 tablet 5  . methocarbamol (ROBAXIN-750) 750 MG tablet Take 1 tablet (750 mg total) by mouth every 8 (eight) hours as needed for muscle spasms. 6 tablet 0  . Methylnaltrexone Bromide (RELISTOR) 150 MG TABS Take 450 mg by mouth daily as needed (for constipation). On days you take your pain medication. Take with water >30 minutes before breakfast 90 tablet 0  . oxybutynin (DITROPAN-XL) 5 MG 24 hr tablet Take 5 mg by mouth at bedtime.    Marland Kitchen oxyCODONE-acetaminophen (PERCOCET) 7.5-325 MG tablet Take 1 tablet by mouth every 4 (four) hours as needed for up to 7 days for severe pain. 42 tablet  0  . pantoprazole (PROTONIX) 40 MG tablet TAKE ONE TABLET BY MOUTH TWICE DAILY. 60 tablet 5  . SYMPROIC 0.2 MG TABS TAKE ONE TABLET BY MOUTH DAILY ON THE DAYS YOU TAKE PAIN MEDICATION 30 tablet 3  . traZODone (DESYREL) 100 MG tablet Take 1 tablet (100 mg total) by mouth at bedtime. 30 tablet 2  . TRULANCE 3 MG TABS TAKE ONE TABLET BY MOUTH DAILY. 30 tablet 1  . zolpidem (AMBIEN) 10 MG tablet Take 1 tablet (10 mg total) by mouth at bedtime as needed for up to 30 days for sleep. 30 tablet 2   No current facility-administered medications for this visit.     Allergies as of 11/08/2018 - Review Complete 11/08/2018  Allergen Reaction Noted    . Neomycin-bacitracin zn-polymyx Other (See Comments) 02/27/2008  . Penicillins Hives   . Sulfonamide derivatives Hives and Swelling 02/27/2008   Past Medical History:  Diagnosis Date  . Allergic rhinitis   . Anxiety   . Arthritis   . Cataracts, bilateral   . Chronic back pain    Sees pain mamagement clinic  . Chronic pain syndrome   . Constipation   . Depression   . GERD (gastroesophageal reflux disease)   . Headaches, cluster 3 yrs ago   last one  . High cholesterol   . Hypertension   . Hypothyroid   . Immature cataract   . Limp    right leg  . Mania (HCC)   . PTSD (post-traumatic stress disorder)   . Right lumbar radiculopathy   . Seizures (HCC) 20 yrs ago   unknown etiology-on meds  . Sleep apnea    STOP BANG score=4  . 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[facility])    Past Surgical History:  Procedure Laterality Date  . BACK SURGERY    . CATARACT EXTRACTION W/PHACO Left 01/04/2016   Procedure: CATARACT EXTRACTION PHACO AND INTRAOCULAR LENS PLACEMENT (IOC);  Surgeon: Jethro Bolus, MD;  Location: [facility];  Service: Ophthalmology;  Laterality: Left;  CDE:4.70  . CATARACT EXTRACTION W/PHACO Right 01/18/2016   Procedure: CATARACT EXTRACTION PHACO AND INTRAOCULAR LENS PLACEMENT RIGHT EYE; CDE:  4.66;  Surgeon: Jethro Bolus, MD;  Location: [facility];  Service: Ophthalmology;  Laterality: Right;  . CHOLECYSTECTOMY    . COLONOSCOPY  10/16/2008   ZRA:[id number], otherwise normal rectum; pancolonic diverticula the remainder of the colonic mucosa appeared normal. Next TCS due 10/2013.  Marland Kitchen COLONOSCOPY  06/28/2006   RMR: Internal hemorrhoids. Diminutive rectal polyps, cold biopsied/ Pancolonic diverticula. Polyps in the right colon removed   . COLONOSCOPY WITH PROPOFOL N/A 05/20/2015   RMR: Pancolonic diverticulosis. Redundant colon   . ESOPHAGOGASTRODUODENOSCOPY  10/16/2008   TGY:BWLSL hiatal hernia otherwise normal esophagus, stomach  D1, D2, status post passage of a 56-French Maloney dilator   . ESOPHAGOGASTRODUODENOSCOPY  06/28/2006   HTD:SKAJGO esophagus. Small hiatal hernia. Otherwise normal stomach, D1 and D2, status post passage of a 56 [place] Maloney dilator  . ESOPHAGOGASTRODUODENOSCOPY (EGD) WITH ESOPHAGEAL DILATION N/A 07/14/2013   RMR: Abnormal esophagus of uncertain significance-status post esophageal biospy. small hiatal hernia.   Marland Kitchen ESOPHAGOGASTRODUODENOSCOPY (EGD) WITH PROPOFOL N/A 05/20/2015   RMR: NORMAL EGD status post maloney dilation   . FOOT SURGERY     left foot-  . INCISION AND DRAINAGE Right 01/03/2013   Procedure: INCISION AND DRAINAGE;  Surgeon: Vickki Hearing, MD;  Location: [facility];  Service: Orthopedics;  Laterality: Right;  . KNEE ARTHROSCOPY  01/2012   right  . KNEE ARTHROSCOPY WITH  LATERAL RELEASE Right 01/03/2013   Procedure: Lateral Release Patella Right Knee;  Surgeon: Vickki Hearing, MD;  Location: [facility];  Service: Orthopedics;  Laterality: Right;  . left foot  x 2  . LUMBAR DISC SURGERY  L4-5  . MALONEY DILATION N/A 05/20/2015   Procedure: Elease Hashimoto DILATION;  Surgeon: Corbin Ade, MD;  Location: [facility];  Service: Endoscopy;  Laterality: N/A;  Maloney dilator # 54  . oophoectomy  bilateral  . PARTIAL HYSTERECTOMY    . TOTAL KNEE ARTHROPLASTY  07/01/2012   Procedure: TOTAL KNEE ARTHROPLASTY;  Surgeon: Vickki Hearing, MD;  Location: [facility];  Service: Orthopedics;  Laterality: Right;  . TOTAL KNEE REVISION Right 01/03/2013   Procedure: Patellaplasty Right Knee;  Surgeon: Vickki Hearing, MD;  Location: [facility];  Service: Orthopedics;  Laterality: Right;   Family History  Problem Relation Age of Onset  . Alcohol abuse Mother   . Anxiety disorder Mother   . Alcohol abuse Father   . Anxiety disorder Father   . Bipolar disorder Sister   . Alcohol abuse Brother   . Anxiety disorder Brother   . Drug abuse Brother   . Dementia Paternal Aunt   . ADD / ADHD Grandchild   . ADD / ADHD Grandchild   . Drug abuse Brother   . Alcohol abuse  Brother   . Anxiety disorder Brother   . Seizures Brother   . Alcohol abuse Brother   . Anxiety disorder Brother   . Seizures Brother   . Alcohol abuse Brother   . Anxiety disorder Brother   . Alcohol abuse Brother   . Anxiety disorder Brother   . Heart disease Other   . Diabetes Other   . Alcohol abuse Other   . Depression Neg Hx   . OCD Neg Hx   . Paranoid behavior Neg Hx   . Schizophrenia Neg Hx   . Sexual abuse Neg Hx   . Physical abuse Neg Hx   . Colon cancer Neg Hx    Social History   Tobacco Use  . Smoking status: Former Smoker    Packs/day: 0.25    Years: 5.00    Pack years: 1.25    Types: Cigarettes    Last attempt to quit: 06/26/2003    Years since quitting: 15.3  . Smokeless tobacco: Never Used  Substance Use Topics  . Alcohol use: Yes    Alcohol/week: 0.0 standard drinks    Comment: occasionally; once a month.  . Drug use: No    Comment: has a past history of street drug use    ROS:  General: Negative for anorexia, weight loss, fever, chills, fatigue, weakness. ENT: Negative for hoarseness, difficulty swallowing , nasal congestion. CV: Negative for chest pain, angina, palpitations, dyspnea on exertion, peripheral edema.  Respiratory: Negative for dyspnea at rest, dyspnea on exertion, cough, sputum, wheezing.  GI: See history of present illness. GU:  Negative for dysuria, hematuria, urinary incontinence, urinary frequency, nocturnal urination.  Endo: Negative for unusual weight change.    Physical Examination:   BP 138/79   Pulse 76   Temp (!) 97.4 F (36.3 C) (Oral)   Ht  (1.6 m)   Wt 247 lb 6.4 oz (112.2 kg)   BMI 43.82 kg/m   General: Well-nourished, well-developed in no acute distress.  Eyes: No icterus. Mouth: Oropharyngeal mucosa moist and pink , no lesions erythema or exudate. Lungs: Clear to auscultation bilaterally.  Heart: Regular rate and rhythm,  no murmurs rubs or gallops.  Abdomen: Bowel sounds are normal, nontender,  nondistended, no hepatosplenomegaly or masses, no abdominal bruits or hernia , no rebound or guarding.   Extremities: No lower extremity edema. No clubbing or deformities. Neuro: Alert and oriented x 4   Skin: Warm and dry, no jaundice.   Psych: Alert and cooperative, normal mood and affect.  Labs:  Lab Results  Component Value Date   CREATININE 0.88 10/28/2018   BUN 12 10/28/2018   NA 137 10/28/2018   K 3.7 10/28/2018   CL 102 10/28/2018   CO2 26 10/28/2018   Lab Results  Component Value Date   ALT 27 10/28/2018   AST 22 10/28/2018   ALKPHOS 54 10/28/2018   BILITOT 0.9 10/28/2018   Lab Results  Component Value Date   WBC 6.2 10/28/2018   HGB 13.8 10/28/2018   HCT 45.3 10/28/2018   MCV 89.0 10/28/2018   PLT 210 10/28/2018    Imaging Studies: Dg Chest 2 View  Result Date: 10/28/2018 CLINICAL DATA:  Pain after fall on Thursday. EXAM: CHEST - 2 VIEW COMPARISON:  07/31/2012 CXR FINDINGS: The heart size and mediastinal contours are within normal limits. Aortic atherosclerosis is noted without aneurysm. Both lungs are clear. The visualized skeletal structures are unremarkable. IMPRESSION: No active cardiopulmonary disease. Aortic atherosclerosis. Electronically Signed   By: Tollie Ethavid  Kwon M.D.   On: 10/28/2018 21:35   Ct Abdomen Pelvis W Contrast  Result Date: 10/28/2018 CLINICAL DATA:  Tripped and fell last Thursday.  Abdominal pain. EXAM: CT ABDOMEN AND PELVIS WITH CONTRAST TECHNIQUE: Multidetector CT imaging of the abdomen and pelvis was performed using the standard protocol following bolus administration of intravenous contrast. CONTRAST:  100mL OMNIPAQUE IOHEXOL 300 MG/ML  SOLN COMPARISON:  None. FINDINGS: Lower chest: The lung bases are clear. The heart is borderline enlarged. No pericardial effusion. No pleural effusion. Hepatobiliary: No focal hepatic lesions or intrahepatic biliary dilatation. No acute hepatic injury or perihepatic fluid collections. The gallbladder is  surgically absent. No common bile duct dilatation. Pancreas: No mass, inflammation or ductal dilatation. Mild pancreatic atrophy. No acute injury or peri pancreatic fluid collection. Spleen: Normal size. No focal lesions. No acute injury or perisplenic fluid collections. Adrenals/Urinary Tract: Adrenal the adrenal glands and kidneys are unremarkable. No acute renal injury or perinephric fluid collections. The bladder appears normal. Stomach/Bowel: The stomach, duodenum, small bowel and colon are unremarkable. No acute inflammatory changes, mass lesions or obstructive findings. Colonic diverticulosis without findings for acute diverticulitis. Vascular/Lymphatic: The aorta is normal in caliber. No dissection. The branch vessels are patent. The major venous structures are patent. No mesenteric or retroperitoneal mass or adenopathy. Small scattered lymph nodes are noted. Reproductive: The uterus is surgically absent. There is a left adnexal cyst which appears simple. It measures 4 cm. Ultrasound follow-up is suggested. I do not see the right ovary. Other: No ascites or abdominal wall hernia. No inguinal mass or adenopathy. Musculoskeletal: No significant bony findings. 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 IMPRESSION: 1. No acute abdominal/pelvic findings, mass lesions or lymphadenopathy. 2. Status post cholecystectomy.  No biliary dilatation. 3. Diffuse colonic diverticulosis without findings for acute diverticulitis. 4. 4 cm simple appearing left adnexal cyst. Disc based on its size and the patient's age I would recommend a follow-up pelvic ultrasound examination in 4-6 months. Electronically Signed   By: Rudie MeyerP.  Gallerani M.D.   On: 10/28/2018 21:26   Ct L-spine No Charge  Result Date: 10/28/2018 CLINICAL DATA:  Fall last week.  Back pain.  Prior back surgery. EXAM: CT LUMBAR SPINE WITHOUT CONTRAST TECHNIQUE: Multidetector CT imaging of the lumbar spine was performed without  intravenous contrast administration. Multiplanar CT image reconstructions were also generated. COMPARISON:  None. FINDINGS: Segmentation: 5 lumbar type vertebrae. Alignment: Normal. Vertebrae: No acute fracture or focal pathologic process. Paraspinal and other soft tissues: Please see report for dedicated CT of the abdomen and pelvis. Disc levels: There is ossification of the posterior longitudinal ligament at T11 and T12 levels, which causes mild spinal canal stenosis. L1-2: Partially calcified disc bulge with mild spinal canal narrowing. No neural foraminal stenosis. L2-3: Unremarkable. L3-4: Moderate facet hypertrophy with mild spinal canal stenosis and mild bilateral foraminal stenosis, right worse than left. L4-5: Disc space narrowing with moderate facet hypertrophy. No spinal canal stenosis. Mild right and moderate-to-severe left neural foraminal stenosis. L5-S1: Severe facet hypertrophy, right worse left. Severe bilateral neural foraminal stenosis. No spinal canal stenosis. IMPRESSION: 1. No acute fracture or static subluxation of the lumbar spine. 2. Moderate-to-severe neural foraminal stenosis at left L4-5 and bilateral L5-S1. 3. 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 Electronically Signed   By: Deatra Robinson M.D.   On: 10/28/2018 21:29   Dg Knee Complete 4 Views Right  Result Date: 10/28/2018 CLINICAL DATA:  Right knee pain after fall on Thursday. EXAM: RIGHT KNEE - COMPLETE 4+ VIEW COMPARISON:  None. FINDINGS: Intact total knee arthroplasty. No hardware failure, joint effusion or dislocation. No periprosthetic fracture or suspicious osseous lesions. IMPRESSION: Intact total knee arthroplasty without hardware failure, joint effusion or dislocation. Electronically Signed   By: Tollie Eth M.D.   On: 10/28/2018 21:34   Dg Humerus Right  Result Date: 10/28/2018 CLINICAL DATA:  Bruising along the lateral aspect of the right arm status  post fall. EXAM: RIGHT HUMERUS - 2+ VIEW COMPARISON:  None. FINDINGS: There is no evidence of fracture or other focal bone lesions. Mild soft tissue induration and debris is noted along the lateral aspect of the right arm. Stigmata of remote lateral and medial epicondylitis with curvilinear ossifications noted off the epicondyles of the humerus. IMPRESSION: Soft tissue induration and debris along the lateral aspect of the right arm. No underlying acute osseous abnormality. Electronically Signed   By: Tollie Eth M.D.   On: 10/28/2018 21:36   Dg Hip Unilat With Pelvis 2-3 Views Right  Result Date: 10/28/2018 CLINICAL DATA:  Right hip pain after fall on Thursday. EXAM: DG HIP (WITH OR WITHOUT PELVIS) 2-3V RIGHT COMPARISON:  None. FINDINGS: Mild disc space narrowing facet arthropathy of the included lower lumbar spine from L4 through S1. No pelvic diastasis or fracture. Mild osteoarthritic sclerosis of the pubic symphysis. The hip joints are maintained bilaterally. No fracture of the proximal femora. Enthesopathy off the lesser trochanters bilaterally. The urinary bladder is opacified by contrast. IMPRESSION: 1. No acute osseous abnormality of the pelvis and right hip. If there is pain out of proportion to radiographic findings, CT may help identified radiographically occult fractures. 2. Lower lumbar degenerative disc and facet arthropathy. Electronically Signed   By: Tollie Eth M.D.   On: 10/28/2018 21:33

## 2018-11-08 NOTE — Assessment & Plan Note (Signed)
Encourage patient to speak with her PCP or gynecologist about CT findings.  She should have a pelvic ultrasound performed in 4 to 6 months per radiology recommendations.

## 2018-11-08 NOTE — Patient Instructions (Signed)
I will review drug list from [name] and let you know if we need to make any changes.   Please follow up with your gynecologist OR PCP to discuss having pelvic ultrasound to look at the cyst near where your left ovary was.   Upper endoscopy as scheduled. See separate instructions.

## 2018-11-11 ENCOUNTER — Other Ambulatory Visit: Payer: Self-pay | Admitting: *Deleted

## 2018-11-11 DIAGNOSIS — Z23 Encounter for immunization: Secondary | ICD-10-CM | POA: Diagnosis not present

## 2018-11-11 DIAGNOSIS — Z1389 Encounter for screening for other disorder: Secondary | ICD-10-CM | POA: Diagnosis not present

## 2018-11-11 DIAGNOSIS — R410 Disorientation, unspecified: Secondary | ICD-10-CM | POA: Diagnosis not present

## 2018-11-11 DIAGNOSIS — G8314 Monoplegia of lower limb affecting left nondominant side: Secondary | ICD-10-CM | POA: Diagnosis not present

## 2018-11-11 NOTE — Progress Notes (Signed)
CC'D TO PCP °

## 2018-11-11 NOTE — Patient Outreach (Signed)
[facility]) Care Management  11/11/2018  Diane [name] 1951-08-01 [id number]   Subjective: Telephone call to patient's home  / mobile number, no answer, left HIPAA compliant voicemail message, and requested call back.    Objective: Per KPN (Knowledge Performance Now, point of care tool) and chart review, patient had a ED visit on 10/28/2018 for fall.   Patient also has a history of hypertension, hyperlipidemia, hypothyroidism, PTSD,Chronic pain syndrome, Sleep apnea, Substance abuse in remission, and seizures.       Assessment: Received [employer] referral on 11/08/2018. Referral source: Anonette Louellen Molder.  Referral reason: Member may benefit from a walker and possible home health.  Patient was advised by referral source to speak with her primary MD / neurologist regarding home health and physical therapy.    Screening  follow up pending patient contact.     Plan: RNCM has sent unsuccessful outreach letter, [facility] pamphlet, will call patient for 3rd telephone outreach attempt within 4 business days, transition of care follow up, and proceed with case closure, within 10 business days if no return call.    Dilana Mcphie H. Gardiner Barefoot, BSN, CCM [facility] Care Management [facility] Telephonic CM Phone: [phone] Fax: [phone]

## 2018-11-14 ENCOUNTER — Other Ambulatory Visit: Payer: Self-pay | Admitting: *Deleted

## 2018-11-14 ENCOUNTER — Other Ambulatory Visit: Payer: Self-pay

## 2018-11-14 DIAGNOSIS — G894 Chronic pain syndrome: Secondary | ICD-10-CM

## 2018-11-14 NOTE — Patient Outreach (Signed)
[facility]) Care Management  11/14/2018  Diane [name] 06-Feb-1951 [id number]   Telephonic Referral (coverage for [facility] [place])    Referral Date: 11/08/18 Referral Source: [place] [facility]  Date of Admission: ED visit on 10/28/18 for a fall history of hypertension, hyperlipidemia, hypothyroidism, PTSD,Chronic pain syndrome, Sleep apnea, Substance abuse in remission, and seizures.    Referral reason: Member may benefit from a walker and possible home health.  Patient was advised by referral source to speak with her primary MD / neurologist regarding home health and physical therapy.    Screening  follow up pending patient contact.   Insurance: [employer] medicare    Outreach attempt #3  No answer. [facility] RN CM left HIPAA compliant voicemail message along with CM's contact info.   Plan: [facility] RN CM scheduled this patient for case closure within 6 business days Voice messages x 3 and an unsuccessful outreach letter has been sent to this patient with attempts to reach patient without success within a 10 business day time frame  Luckey L. Noelle Penner, RN, BSN, CCM [facility] Telephonic Care Management Care Coordinator Direct Number [phone] Mobile number [phone]  Main [facility] number [phone] Fax number [phone]

## 2018-11-15 MED ORDER — OXYCODONE-ACETAMINOPHEN 7.5-325 MG PO TABS: 1.0000 | ORAL_TABLET | ORAL | 0 refills | Status: DC | PRN

## 2018-11-21 ENCOUNTER — Other Ambulatory Visit: Payer: Self-pay | Admitting: *Deleted

## 2018-11-21 NOTE — Patient Outreach (Signed)
[facility]) Care Management  11/21/2018  SHARONLEE KUNDU 07/30/1951 [id number]  No response from patient outreach attempts will proceed with case closure.    Objective:Per KPN (Knowledge Performance Now, point of care tool) and chart review,patient had a ED visit on 10/28/2018 for fall. Patient also has a history of hypertension, hyperlipidemia, hypothyroidism, PTSD,Chronic pain syndrome,Sleep apnea,Substance abuse in remission, and seizures.     Assessment: Received [employer] referral on 11/08/2018.Referral source: Anonette Louellen Molder. Referral reason: Member may benefit from a walker and possible home health. Patient was advised by referral source to speak with her primary MD / neurologist regarding home health and physical therapy. Screening follow up not completed due to unable to contact patient and will proceed with case closure.     Plan:Case closure due to unable to reach.     Khadeejah Castner H. Gardiner Barefoot, BSN, CCM [facility] Care Management [facility] Telephonic CM Phone: [phone] Fax: [phone]

## 2018-11-22 ENCOUNTER — Other Ambulatory Visit: Payer: Self-pay | Admitting: Orthopedic Surgery

## 2018-11-22 DIAGNOSIS — G894 Chronic pain syndrome: Secondary | ICD-10-CM

## 2018-11-22 NOTE — Telephone Encounter (Signed)
Patient called to request refill:  oxyCODONE-acetaminophen (PERCOCET) 7.5-325 MG tablet 42 tablet 0  -[name], [place]  - reminded patient of policy of refill requests received after noon on Thursdays; voiced understanding.

## 2018-11-25 MED ORDER — OXYCODONE-ACETAMINOPHEN 7.5-325 MG PO TABS: 1.0000 | ORAL_TABLET | ORAL | 0 refills | Status: DC | PRN

## 2018-11-29 ENCOUNTER — Other Ambulatory Visit: Payer: Self-pay

## 2018-11-29 DIAGNOSIS — G894 Chronic pain syndrome: Secondary | ICD-10-CM

## 2018-12-02 DIAGNOSIS — G4733 Obstructive sleep apnea (adult) (pediatric): Secondary | ICD-10-CM | POA: Diagnosis not present

## 2018-12-02 MED ORDER — OXYCODONE-ACETAMINOPHEN 7.5-325 MG PO TABS: 1.0000 | ORAL_TABLET | ORAL | 0 refills | Status: DC | PRN

## 2018-12-04 DIAGNOSIS — R296 Repeated falls: Secondary | ICD-10-CM | POA: Diagnosis not present

## 2018-12-04 DIAGNOSIS — M130 Polyarthritis, unspecified: Secondary | ICD-10-CM | POA: Diagnosis not present

## 2018-12-04 DIAGNOSIS — Z79899 Other long term (current) drug therapy: Secondary | ICD-10-CM | POA: Diagnosis not present

## 2018-12-04 DIAGNOSIS — R2689 Other abnormalities of gait and mobility: Secondary | ICD-10-CM | POA: Diagnosis not present

## 2018-12-06 ENCOUNTER — Other Ambulatory Visit: Payer: Self-pay | Admitting: Gastroenterology

## 2018-12-06 ENCOUNTER — Other Ambulatory Visit (HOSPITAL_COMMUNITY): Payer: Self-pay | Admitting: Psychiatry

## 2018-12-06 DIAGNOSIS — F5105 Insomnia due to other mental disorder: Secondary | ICD-10-CM

## 2018-12-06 DIAGNOSIS — F3130 Bipolar disorder, current episode depressed, mild or moderate severity, unspecified: Secondary | ICD-10-CM

## 2018-12-09 ENCOUNTER — Other Ambulatory Visit: Payer: Self-pay | Admitting: Orthopedic Surgery

## 2018-12-09 DIAGNOSIS — R51 Headache: Secondary | ICD-10-CM | POA: Diagnosis not present

## 2018-12-09 DIAGNOSIS — M545 Low back pain: Secondary | ICD-10-CM | POA: Diagnosis not present

## 2018-12-09 DIAGNOSIS — R413 Other amnesia: Secondary | ICD-10-CM | POA: Diagnosis not present

## 2018-12-09 DIAGNOSIS — G894 Chronic pain syndrome: Secondary | ICD-10-CM

## 2018-12-09 DIAGNOSIS — R410 Disorientation, unspecified: Secondary | ICD-10-CM | POA: Diagnosis not present

## 2018-12-09 MED ORDER — OXYCODONE-ACETAMINOPHEN 7.5-325 MG PO TABS: 1.0000 | ORAL_TABLET | ORAL | 0 refills | Status: DC | PRN

## 2018-12-09 NOTE — Telephone Encounter (Signed)
Patient called for refill:  oxyCODONE-acetaminophen (PERCOCET) 7.5-325 MG tablet 42 tablet   - Pharmacy:  [facility], [place]

## 2018-12-10 ENCOUNTER — Telehealth: Payer: Self-pay

## 2018-12-10 DIAGNOSIS — M5442 Lumbago with sciatica, left side: Secondary | ICD-10-CM | POA: Diagnosis not present

## 2018-12-10 NOTE — Telephone Encounter (Signed)
PA was approved for Symproic, will scan approval letter when received.

## 2018-12-10 NOTE — Telephone Encounter (Signed)
PA for Symporic was submitted through covermymeds. Com waiting on an approval or denial.

## 2018-12-16 ENCOUNTER — Other Ambulatory Visit: Payer: Self-pay | Admitting: Orthopedic Surgery

## 2018-12-16 DIAGNOSIS — G894 Chronic pain syndrome: Secondary | ICD-10-CM

## 2018-12-16 MED ORDER — OXYCODONE-ACETAMINOPHEN 7.5-325 MG PO TABS: 1.0000 | ORAL_TABLET | ORAL | 0 refills | Status: DC | PRN

## 2018-12-16 NOTE — Telephone Encounter (Signed)
Patient requests refill on Oxycodone/Acetaminophen 7.5-325 mgs.  Qty  42 ° °     °Sig: Take 1 tablet by mouth every 4 (four) hours as needed for up to 7 days for severe pain.  °      ° ° °Patient states she uses [facility] in [place] °

## 2018-12-17 ENCOUNTER — Encounter (HOSPITAL_COMMUNITY): Payer: Self-pay

## 2018-12-18 ENCOUNTER — Other Ambulatory Visit: Payer: Self-pay

## 2018-12-18 ENCOUNTER — Encounter (HOSPITAL_COMMUNITY)
Admission: RE | Admit: 2018-12-18 | Discharge: 2018-12-18 | Disposition: A | Discharge status: Home / Self Care | Payer: Medicare Other | Source: Ambulatory Visit | Attending: Internal Medicine | Admitting: Internal Medicine

## 2018-12-19 ENCOUNTER — Telehealth: Payer: Self-pay

## 2018-12-19 DIAGNOSIS — R42 Dizziness and giddiness: Secondary | ICD-10-CM | POA: Diagnosis not present

## 2018-12-19 DIAGNOSIS — M25559 Pain in unspecified hip: Secondary | ICD-10-CM | POA: Diagnosis not present

## 2018-12-19 NOTE — Telephone Encounter (Addendum)
Called pt, EGD/DIL w/Propofol w/RMR 12/23/18 moved up to 7:30am. Pt to arrive at 6:15am. Aware NPO after midnight. LMOVM for endo scheduler.

## 2018-12-19 NOTE — Telephone Encounter (Signed)
Endo scheduler wants EGD/DIL w/Propofol for 12/23/18 moved up to 7:30am, arrive at 6:15am. Tried to call pt, no answer, LMOVM for return call.

## 2018-12-23 ENCOUNTER — Ambulatory Visit (HOSPITAL_COMMUNITY): Payer: Medicare Other | Admitting: Anesthesiology

## 2018-12-23 ENCOUNTER — Other Ambulatory Visit: Payer: Self-pay | Admitting: Orthopedic Surgery

## 2018-12-23 ENCOUNTER — Encounter (HOSPITAL_COMMUNITY): Payer: Self-pay | Admitting: *Deleted

## 2018-12-23 ENCOUNTER — Encounter (HOSPITAL_COMMUNITY)
Admission: RE | Disposition: A | Discharge status: Home / Self Care | Payer: Self-pay | Source: Ambulatory Visit | Attending: Internal Medicine

## 2018-12-23 ENCOUNTER — Ambulatory Visit (HOSPITAL_COMMUNITY)
Admission: RE | Admit: 2018-12-23 | Discharge: 2018-12-23 | Disposition: A | Discharge status: Home / Self Care | Payer: Medicare Other | Source: Ambulatory Visit | Attending: Internal Medicine | Admitting: Internal Medicine

## 2018-12-23 DIAGNOSIS — R1310 Dysphagia, unspecified: Secondary | ICD-10-CM | POA: Diagnosis not present

## 2018-12-23 DIAGNOSIS — Z881 Allergy status to other antibiotic agents status: Secondary | ICD-10-CM | POA: Diagnosis not present

## 2018-12-23 DIAGNOSIS — M1990 Unspecified osteoarthritis, unspecified site: Secondary | ICD-10-CM | POA: Insufficient documentation

## 2018-12-23 DIAGNOSIS — Z880 Allergy status to penicillin: Secondary | ICD-10-CM | POA: Insufficient documentation

## 2018-12-23 DIAGNOSIS — Z882 Allergy status to sulfonamides status: Secondary | ICD-10-CM | POA: Diagnosis not present

## 2018-12-23 DIAGNOSIS — R569 Unspecified convulsions: Secondary | ICD-10-CM | POA: Insufficient documentation

## 2018-12-23 DIAGNOSIS — F4310 Post-traumatic stress disorder, unspecified: Secondary | ICD-10-CM | POA: Insufficient documentation

## 2018-12-23 DIAGNOSIS — Z79899 Other long term (current) drug therapy: Secondary | ICD-10-CM | POA: Diagnosis not present

## 2018-12-23 DIAGNOSIS — Z96651 Presence of right artificial knee joint: Secondary | ICD-10-CM | POA: Diagnosis not present

## 2018-12-23 DIAGNOSIS — G4730 Sleep apnea, unspecified: Secondary | ICD-10-CM | POA: Insufficient documentation

## 2018-12-23 DIAGNOSIS — R1319 Other dysphagia: Secondary | ICD-10-CM

## 2018-12-23 DIAGNOSIS — Z791 Long term (current) use of non-steroidal anti-inflammatories (NSAID): Secondary | ICD-10-CM | POA: Diagnosis not present

## 2018-12-23 DIAGNOSIS — Z79890 Hormone replacement therapy (postmenopausal): Secondary | ICD-10-CM | POA: Diagnosis not present

## 2018-12-23 DIAGNOSIS — I10 Essential (primary) hypertension: Secondary | ICD-10-CM | POA: Diagnosis not present

## 2018-12-23 DIAGNOSIS — R1314 Dysphagia, pharyngoesophageal phase: Secondary | ICD-10-CM | POA: Insufficient documentation

## 2018-12-23 DIAGNOSIS — Z87891 Personal history of nicotine dependence: Secondary | ICD-10-CM | POA: Insufficient documentation

## 2018-12-23 DIAGNOSIS — G894 Chronic pain syndrome: Secondary | ICD-10-CM | POA: Diagnosis not present

## 2018-12-23 DIAGNOSIS — E039 Hypothyroidism, unspecified: Secondary | ICD-10-CM | POA: Diagnosis not present

## 2018-12-23 DIAGNOSIS — E7800 Pure hypercholesterolemia, unspecified: Secondary | ICD-10-CM | POA: Diagnosis not present

## 2018-12-23 DIAGNOSIS — K219 Gastro-esophageal reflux disease without esophagitis: Secondary | ICD-10-CM | POA: Insufficient documentation

## 2018-12-23 HISTORY — PX: ESOPHAGOGASTRODUODENOSCOPY (EGD) WITH PROPOFOL: SHX5813

## 2018-12-23 HISTORY — PX: MALONEY DILATION: SHX5535

## 2018-12-23 SURGERY — ESOPHAGOGASTRODUODENOSCOPY (EGD) WITH PROPOFOL
Anesthesia: General

## 2018-12-23 MED ORDER — CHLORHEXIDINE GLUCONATE CLOTH 2 % EX PADS: 6.0000 | MEDICATED_PAD | Freq: Once | CUTANEOUS | Status: DC

## 2018-12-23 MED ORDER — MIDAZOLAM HCL 2 MG/2ML IJ SOLN: 0.5000 mg | Freq: Once | INTRAMUSCULAR | Status: DC | PRN

## 2018-12-23 MED ORDER — PROPOFOL 500 MG/50ML IV EMUL
INTRAVENOUS | Status: DC | PRN
  Administered 2018-12-23: 75 ug/kg/min via INTRAVENOUS

## 2018-12-23 MED ORDER — PROPOFOL 10 MG/ML IV BOLUS
INTRAVENOUS | Status: AC
  Filled 2018-12-23: qty 40

## 2018-12-23 MED ORDER — HYDROMORPHONE HCL 1 MG/ML IJ SOLN: 0.2500 mg | INTRAMUSCULAR | Status: DC | PRN

## 2018-12-23 MED ORDER — HYDROCODONE-ACETAMINOPHEN 7.5-325 MG PO TABS: 1.0000 | ORAL_TABLET | Freq: Once | ORAL | Status: DC | PRN

## 2018-12-23 MED ORDER — OXYCODONE-ACETAMINOPHEN 7.5-325 MG PO TABS: 1.0000 | ORAL_TABLET | ORAL | 0 refills | Status: DC | PRN

## 2018-12-23 MED ORDER — LACTATED RINGERS IV SOLN
INTRAVENOUS | Status: DC
  Administered 2018-12-23: 1000 mL via INTRAVENOUS

## 2018-12-23 MED ORDER — KETAMINE HCL 50 MG/5ML IJ SOSY
PREFILLED_SYRINGE | INTRAMUSCULAR | Status: AC
  Filled 2018-12-23: qty 5

## 2018-12-23 MED ORDER — PROPOFOL 10 MG/ML IV BOLUS
INTRAVENOUS | Status: DC | PRN
  Administered 2018-12-23: 20 mg via INTRAVENOUS

## 2018-12-23 MED ORDER — PROMETHAZINE HCL 25 MG/ML IJ SOLN: 6.2500 mg | INTRAMUSCULAR | Status: DC | PRN

## 2018-12-23 NOTE — H&P (Signed)
 @   Primary Care Physician:  Elfredia Nevins, MD Primary Gastroenterologist:  Dr. Jena Gauss  Pre-Procedure History & Physical: HPI:  Diane [name] is a 68 y.o. female here for recurrent esophageal dysphagia.  History of EGD with ED 2016; did well until recently.  Reflux well controlled on Protonix 40 mg daily.  Past Medical History:  Diagnosis Date  . Allergic rhinitis   . Anxiety   . Arthritis   . Cataracts, bilateral   . Chronic back pain    Sees pain mamagement clinic  . Chronic pain syndrome   . Constipation   . Depression   . GERD (gastroesophageal reflux disease)   . Headaches, cluster 3 yrs ago   last one  . High cholesterol   . Hypertension   . Hypothyroid   . Immature cataract   . Limp    right leg  . Mania (HCC)   . PTSD (post-traumatic stress disorder)   . Right lumbar radiculopathy   . Seizures (HCC) 20 yrs ago   unknown etiology-on meds  . Sleep apnea    STOP BANG score=4  . 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[facility])     Past Surgical History:  Procedure Laterality Date  . BACK SURGERY    . CATARACT EXTRACTION W/PHACO Left 01/04/2016   Procedure: CATARACT EXTRACTION PHACO AND INTRAOCULAR LENS PLACEMENT (IOC);  Surgeon: Jethro Bolus, MD;  Location: [facility];  Service: Ophthalmology;  Laterality: Left;  CDE:4.70  . CATARACT EXTRACTION W/PHACO Right 01/18/2016   Procedure: CATARACT EXTRACTION PHACO AND INTRAOCULAR LENS PLACEMENT RIGHT EYE; CDE:  4.66;  Surgeon: Jethro Bolus, MD;  Location: [facility];  Service: Ophthalmology;  Laterality: Right;  . CHOLECYSTECTOMY    . COLONOSCOPY  10/16/2008   [id number], otherwise normal rectum; pancolonic diverticula the remainder of the colonic mucosa appeared normal. Next TCS due 10/2013.  Marland Kitchen COLONOSCOPY  06/28/2006   RMR: Internal hemorrhoids. Diminutive rectal polyps, cold biopsied/ Pancolonic diverticula. Polyps in the right colon removed   . COLONOSCOPY WITH PROPOFOL N/A 05/20/2015   RMR: Pancolonic diverticulosis. Redundant  colon   . ESOPHAGOGASTRODUODENOSCOPY  10/16/2008   XBJ:YNWGN hiatal hernia otherwise normal esophagus, stomach  D1, D2, status post passage of a 56-French Maloney dilator  . ESOPHAGOGASTRODUODENOSCOPY  06/28/2006   FAO:ZHYQMV esophagus. Small hiatal hernia. Otherwise normal stomach, D1 and D2, status post passage of a 56 [place] Maloney dilator  . ESOPHAGOGASTRODUODENOSCOPY (EGD) WITH ESOPHAGEAL DILATION N/A 07/14/2013   RMR: Abnormal esophagus of uncertain significance-status post esophageal biospy. small hiatal hernia.   Marland Kitchen ESOPHAGOGASTRODUODENOSCOPY (EGD) WITH PROPOFOL N/A 05/20/2015   RMR: NORMAL EGD status post maloney dilation   . FOOT SURGERY     left foot-  . INCISION AND DRAINAGE Right 01/03/2013   Procedure: INCISION AND DRAINAGE;  Surgeon: Vickki Hearing, MD;  Location: [facility];  Service: Orthopedics;  Laterality: Right;  . KNEE ARTHROSCOPY  01/2012   right  . KNEE ARTHROSCOPY WITH LATERAL RELEASE Right 01/03/2013   Procedure: Lateral Release Patella Right Knee;  Surgeon: Vickki Hearing, MD;  Location: [facility];  Service: Orthopedics;  Laterality: Right;  . left foot  x 2  . LUMBAR DISC SURGERY  L4-5  . MALONEY DILATION N/A 05/20/2015   Procedure: Elease Hashimoto DILATION;  Surgeon: Corbin Ade, MD;  Location: [facility];  Service: Endoscopy;  Laterality: N/A;  Maloney dilator # 54  . oophoectomy  bilateral  . PARTIAL HYSTERECTOMY    . TOTAL KNEE ARTHROPLASTY  07/01/2012   Procedure: TOTAL KNEE  ARTHROPLASTY;  Surgeon: Vickki HearingStanley E Harrison, MD;  Location: [facility];  Service: Orthopedics;  Laterality: Right;  . TOTAL KNEE REVISION Right 01/03/2013   Procedure: Patellaplasty Right Knee;  Surgeon: Vickki HearingStanley E Harrison, MD;  Location: [facility];  Service: Orthopedics;  Laterality: Right;    Prior to Admission medications   Medication Sig Start Date End Date Taking? Authorizing Provider  ALPRAZolam Prudy Feeler(XANAX) 0.5 MG tablet TAKE ONE TABLET BY MOUTH TWICE DAILY AND TAKE TWO (2) TABLETS AT BEDTIME 12/06/18  Yes  Myrlene Brokeross, Deborah R, MD  amLODipine (NORVASC) 5 MG tablet Take 5 mg by mouth daily.   Yes [provider]  atorvastatin (LIPITOR) 20 MG tablet Take 20 mg by mouth daily.   Yes [provider]  cyclobenzaprine (FLEXERIL) 10 MG tablet TAKE ONE TABLET BY MOUTH THREE TIMES DAILY AS NEEDED FOR MUSCLE SPASMS. Patient taking differently: Take 10 mg by mouth 3 (three) times daily as needed for muscle spasms.  08/30/18  Yes Vickki HearingHarrison, Stanley E, MD  divalproex (DEPAKOTE) 250 MG DR tablet TAKE THREE (3) TABLETS BY MOUTH AT BEDTIME. 12/06/18  Yes Myrlene Brokeross, Deborah R, MD  FLUoxetine (PROZAC) 20 MG capsule TAKE ONE CAPSULE BY MOUTH DAILY. 12/06/18  Yes Myrlene Brokeross, Deborah R, MD  hydrocortisone (ANUSOL-HC) 2.5 % rectal cream Place 1 application rectally 2 (two) times daily. Patient taking differently: Place 1 application rectally daily as needed for hemorrhoids.  02/06/18  Yes Gelene MinkBoone, Anna W, NP  levothyroxine (SYNTHROID, LEVOTHROID) 150 MCG tablet Take 150 mcg by mouth daily before breakfast.  03/29/17  Yes [provider]  lisinopril (PRINIVIL,ZESTRIL) 10 MG tablet Take 10 mg by mouth daily.   Yes [provider]  lubiprostone (AMITIZA) 24 MCG capsule Take 24 mcg by mouth 2 (two) times daily with a meal.   Yes [provider]  meloxicam (MOBIC) 7.5 MG tablet TAKE ONE TABLET BY MOUTH DAILY 09/11/18  Yes Vickki HearingHarrison, Stanley E, MD  oxyCODONE-acetaminophen (PERCOCET) 7.5-325 MG tablet Take 1 tablet by mouth every 4 (four) hours as needed for up to 7 days for severe pain. 12/16/18 12/23/18 Yes Vickki HearingHarrison, Stanley E, MD  pantoprazole (PROTONIX) 40 MG tablet TAKE ONE TABLET BY MOUTH TWICE DAILY. Patient taking differently: Take 40 mg by mouth 2 (two) times daily.  10/08/18  Yes Anice PaganiniGill, Eric A, NP  RELISTOR 150 MG TABS TAKE THREE (3) TABLETS BY MOUTH DAILY AS NEEDED FOR CONSTIPATION ON DAYS YOU TAKE YOUR PAIN MEDICATION. TAKE WITH WATER 30 MINUTES BEFORE BREAKFAST. 12/06/18  Yes Lewis, Leslie S, PA-C   SYMPROIC 0.2 MG TABS TAKE ONE TABLET BY MOUTH DAILY ON THE DAYS YOU TAKE PAIN MEDICATION Patient taking differently: Take 0.2 mg by mouth daily.  10/08/18  Yes Anice PaganiniGill, Eric A, NP  TRULANCE 3 MG TABS TAKE ONE TABLET BY MOUTH DAILY. Patient taking differently: Take 3 mg by mouth daily.  11/08/18  Yes Gelene MinkBoone, Anna W, NP  zolpidem (AMBIEN) 10 MG tablet TAKE ONE TABLET BY MOUTH AT BEDTIME AS NEEDED FOR SLEEP. 12/06/18  Yes Myrlene Brokeross, Deborah R, MD  clindamycin (CLEOCIN) 300 MG capsule Take 600 mg by mouth See admin instructions. Take 600 mg by mouth 1 hour prior to dental appointments 07/30/18   [provider]  methocarbamol (ROBAXIN-750) 750 MG tablet Take 1 tablet (750 mg total) by mouth every 8 (eight) hours as needed for muscle spasms. Patient not taking: Reported on 12/05/2018 10/28/18   Samuel JesterMcManus, Kathleen, DO  traZODone (DESYREL) 100 MG tablet Take 1 tablet (100 mg total) by  mouth at bedtime. Patient not taking: Reported on 12/05/2018 09/24/18   Myrlene Broker, MD    Allergies as of 11/08/2018 - Review Complete 11/08/2018  Allergen Reaction Noted  . Neomycin-bacitracin zn-polymyx Other (See Comments) 02/27/2008  . Penicillins Hives   . Sulfonamide derivatives Hives and Swelling 02/27/2008    Family History  Problem Relation Age of Onset  . Alcohol abuse Mother   . Anxiety disorder Mother   . Alcohol abuse Father   . Anxiety disorder Father   . Bipolar disorder Sister   . Alcohol abuse Brother   . Anxiety disorder Brother   . Drug abuse Brother   . Dementia Paternal Aunt   . ADD / ADHD Grandchild   . ADD / ADHD Grandchild   . Drug abuse Brother   . Alcohol abuse Brother   . Anxiety disorder Brother   . Seizures Brother   . Alcohol abuse Brother   . Anxiety disorder Brother   . Seizures Brother   . Alcohol abuse Brother   . Anxiety disorder Brother   . Alcohol abuse Brother   . Anxiety disorder Brother   . Heart disease Other   . Diabetes Other   . Alcohol abuse Other   .  Depression Neg Hx   . OCD Neg Hx   . Paranoid behavior Neg Hx   . Schizophrenia Neg Hx   . Sexual abuse Neg Hx   . Physical abuse Neg Hx   . Colon cancer Neg Hx     Social History   Socioeconomic History  . Marital status: Legally Separated    Spouse name: Not on file  . Number of children: Not on file  . Years of education: 12th grade  . Highest education level: Not on file  Occupational History  . Occupation: disabled    Associate Professor: UNEMPLOYED  Social Needs  . Financial resource strain: Not on file  . Food insecurity:    Worry: Not on file    Inability: Not on file  . Transportation needs:    Medical: Not on file    Non-medical: Not on file  Tobacco Use  . Smoking status: Former Smoker    Packs/day: 0.25    Years: 5.00    Pack years: 1.25    Types: Cigarettes    Last attempt to quit: 06/26/2003    Years since quitting: 15.5  . Smokeless tobacco: Never Used  Substance and Sexual Activity  . Alcohol use: Yes    Alcohol/week: 0.0 standard drinks    Comment: occasionally; once a month.  . Drug use: No    Comment: has a past history of street drug use  . Sexual activity: Never    Birth control/protection: Surgical  Lifestyle  . Physical activity:    Days per week: Not on file    Minutes per session: Not on file  . Stress: Not on file  Relationships  . Social connections:    Talks on phone: Not on file    Gets together: Not on file    Attends religious service: Not on file    Active member of club or organization: Not on file    Attends meetings of clubs or organizations: Not on file    Relationship status: Not on file  . Intimate partner violence:    Fear of current or ex partner: Not on file    Emotionally abused: Not on file    Physically abused: Not on file    Forced sexual  activity: Not on file  Other Topics Concern  . Not on file  Social History Narrative  . Not on file    Review of Systems: See HPI, otherwise negative ROS  Physical Exam: BP  (!) 158/80   Pulse 76   Temp 98.4 F (36.9 C) (Oral)   Resp 18   Ht  (1.6 m)   Wt 106.6 kg   SpO2 93%   BMI 41.63 kg/m  General:   Alert,  Well-developed, well-nourished, pleasant and cooperative in NAD Lungs:  Clear throughout to auscultation.   No wheezes, crackles, or rhonchi. No acute distress. Heart:  Regular rate and rhythm; no murmurs, clicks, rubs,  or gallops. Abdomen: Non-distended, normal bowel sounds.  Soft and nontender without appreciable mass or hepatosplenomegaly.  Pulses:  Normal pulses noted. Extremities:  Without clubbing or edema.  Impression/Plan: 68 year old lady with recurrent esophageal dysphagia.  Responded nicely to empiric dilation previously.  Here with a recurrent symptoms.  EGD with ED today per plan as feasible/appropriate.  The risks, benefits, limitations, alternatives and imponderables have been reviewed with the patient. Potential for esophageal dilation, biopsy, etc. have also been reviewed.  Questions have been answered. All parties agreeable.     Notice: This dictation was prepared with Dragon dictation along with smaller phrase technology. Any transcriptional errors that result from this process are unintentional and may not be corrected upon review.

## 2018-12-23 NOTE — Discharge Instructions (Signed)
Gastroesophageal Reflux Disease, Adult Gastroesophageal reflux (GER) happens when acid from the stomach flows up into the tube that connects the mouth and the stomach (esophagus). Normally, food travels down the esophagus and stays in the stomach to be digested. With GER, food and stomach acid sometimes move back up into the esophagus. You may have a disease called gastroesophageal reflux disease (GERD) if the reflux:  Happens often.  Causes frequent or very bad symptoms.  Causes problems such as damage to the esophagus. When this happens, the esophagus becomes sore and swollen (inflamed). Over time, GERD can make small holes (ulcers) in the lining of the esophagus. What are the causes? This condition is caused by a problem with the muscle between the esophagus and the stomach. When this muscle is weak or not normal, it does not close properly to keep food and acid from coming back up from the stomach. The muscle can be weak because of:  Tobacco use.  Pregnancy.  Having a certain type of hernia (hiatal hernia).  Alcohol use.  Certain foods and drinks, such as coffee, chocolate, onions, and peppermint. What increases the risk? You are more likely to develop this condition if you:  Are overweight.  Have a disease that affects your connective tissue.  Use NSAID medicines. What are the signs or symptoms? Symptoms of this condition include:  Heartburn.  Difficult or painful swallowing.  The feeling of having a lump in the throat.  A bitter taste in the mouth.  Bad breath.  Having a lot of saliva.  Having an upset or bloated stomach.  Belching.  Chest pain. Different conditions can cause chest pain. Make sure you see your doctor if you have chest pain.  Shortness of breath or noisy breathing (wheezing).  Ongoing (chronic) cough or a cough at night.  Wearing away of the surface of teeth (tooth enamel).  Weight loss. How is this treated? Treatment will depend on how  bad your symptoms are. Your doctor may suggest:  Changes to your diet.  Medicine.  Surgery. Follow these instructions at home: Eating and drinking   Follow a diet as told by your doctor. You may need to avoid foods and drinks such as: ? Coffee and tea (with or without caffeine). ? Drinks that contain alcohol. ? Energy drinks and sports drinks. ? Bubbly (carbonated) drinks or sodas. ? Chocolate and cocoa. ? Peppermint and mint flavorings. ? Garlic and onions. ? Horseradish. ? Spicy and acidic foods. These include peppers, chili powder, curry powder, vinegar, hot sauces, and BBQ sauce. ? Citrus fruit juices and citrus fruits, such as oranges, lemons, and limes. ? Tomato-based foods. These include red sauce, chili, salsa, and pizza with red sauce. ? Fried and fatty foods. These include donuts, french fries, potato chips, and high-fat dressings. ? High-fat meats. These include hot dogs, rib eye steak, sausage, ham, and bacon. ? High-fat dairy items, such as whole milk, butter, and cream cheese.  Eat small meals often. Avoid eating large meals.  Avoid drinking large amounts of liquid with your meals.  Avoid eating meals during the 2-3 hours before bedtime.  Avoid lying down right after you eat.  Do not exercise right after you eat. Lifestyle   Do not use any products that contain nicotine or tobacco. These include cigarettes, e-cigarettes, and chewing tobacco. If you need help quitting, ask your doctor.  Try to lower your stress. If you need help doing this, ask your doctor.  If you are overweight, lose an amount  of weight that is healthy for you. Ask your doctor about a safe weight loss goal. General instructions  Pay attention to any changes in your symptoms.  Take over-the-counter and prescription medicines only as told by your doctor. Do not take aspirin, ibuprofen, or other NSAIDs unless your doctor says it is okay.  Wear loose clothes. Do not wear anything tight  around your waist.  Raise (elevate) the head of your bed about 6 inches (15 cm).  Avoid bending over if this makes your symptoms worse.  Keep all follow-up visits as told by your doctor. This is important. Contact a doctor if:  You have new symptoms.  You lose weight and you do not know why.  You have trouble swallowing or it hurts to swallow.  You have wheezing or a cough that keeps happening.  Your symptoms do not get better with treatment.  You have a hoarse voice. Get help right away if:  You have pain in your arms, neck, jaw, teeth, or back.  You feel sweaty, dizzy, or light-headed.  You have chest pain or shortness of breath.  You throw up (vomit) and your throw-up looks like blood or coffee grounds.  You pass out (faint).  Your poop (stool) is bloody or black.  You cannot swallow, drink, or eat. Summary  If a person has gastroesophageal reflux disease (GERD), food and stomach acid move back up into the esophagus and cause symptoms or problems such as damage to the esophagus.  Treatment will depend on how bad your symptoms are.  Follow a diet as told by your doctor.  Take all medicines only as told by your doctor. This information is not intended to replace advice given to you by your health care provider. Make sure you discuss any questions you have with your health care provider. Document Released: 02/14/2008 Document Revised: 03/06/2018 Document Reviewed: 03/06/2018 Elsevier Interactive Patient Education  2019 [employer]. EGD Discharge instructions Please read the instructions outlined below and refer to this sheet in the next few weeks. These discharge instructions provide you with general information on caring for yourself after you leave the hospital. Your doctor may also give you specific instructions. While your treatment has been planned according to the most current medical practices available, unavoidable complications occasionally occur. If you  have any problems or questions after discharge, please call your doctor. ACTIVITY  You may resume your regular activity but move at a slower pace for the next 24 hours.   Take frequent rest periods for the next 24 hours.   Walking will help expel (get rid of) the air and reduce the bloated feeling in your abdomen.   No driving for 24 hours (because of the anesthesia (medicine) used during the test).   You may shower.   Do not sign any important legal documents or operate any machinery for 24 hours (because of the anesthesia used during the test).  NUTRITION  Drink plenty of fluids.   You may resume your normal diet.   Begin with a light meal and progress to your normal diet.   Avoid alcoholic beverages for 24 hours or as instructed by your caregiver.  MEDICATIONS  You may resume your normal medications unless your caregiver tells you otherwise.  WHAT YOU CAN EXPECT TODAY  You may experience abdominal discomfort such as a feeling of fullness or gas pains.  FOLLOW-UP  Your doctor will discuss the results of your test with you.  SEEK IMMEDIATE MEDICAL ATTENTION IF ANY OF  THE FOLLOWING OCCUR:  Excessive nausea (feeling sick to your stomach) and/or vomiting.   Severe abdominal pain and distention (swelling).   Trouble swallowing.   Temperature over 101 F (37.8 C).   Rectal bleeding or vomiting of blood.    GERD information provided  Continue Protonix 40 mg twice daily  Office visit with [place] in 6 months

## 2018-12-23 NOTE — Transfer of Care (Signed)
Immediate Anesthesia Transfer of Care Note  Patient: Yeila M Gacek  Procedure(s) Performed: ESOPHAGOGASTRODUODENOSCOPY (EGD) WITH PROPOFOL (N/A ) MALONEY DILATION (N/A )  Patient Location: PACU  Anesthesia Type:General  Level of Consciousness: awake  Airway & Oxygen Therapy: Patient Spontanous Breathing  Post-op Assessment: Report given to RN  Post vital signs: Reviewed and stable  Last Vitals:  Vitals Value Taken Time  BP    Temp    Pulse 68 12/23/2018  8:08 AM  Resp 10 12/23/2018  8:08 AM  SpO2 95 % 12/23/2018  8:08 AM  Vitals shown include unvalidated device data.  Last Pain:  Vitals:   12/23/18 0742  TempSrc:   PainSc: 0-No pain      Patients Stated Pain Goal: 6 (12/23/18 0706)  Complications: No apparent anesthesia complications

## 2018-12-23 NOTE — Op Note (Signed)
[facility] Patient Name: [name] Procedure Date: 12/23/2018 7:12 AM MRN: [id number] Date of Birth: 22-Sep-1950 Attending MD: Gennette Pacobert Michael Rourk , MD CSN: [id number] Age: 68 Admit Type: Outpatient Procedure:                Upper GI endoscopy Indications:              Dysphagia Providers:                Gennette Pacobert Michael Rourk, MD, Criselda PeachesLurae B. Patsy LagerAlbert RN, RN,                            Dyann Ruddleonya Wilson Referring MD:              Medicines:                Propofol per Anesthesia Complications:            No immediate complications. Estimated Blood Loss:     Estimated blood loss was minimal. Procedure:                Pre-Anesthesia Assessment:                           - Prior to the procedure, a History and Physical                            was performed, and patient medications and                            allergies were reviewed. The patient's tolerance of                            previous anesthesia was also reviewed. The risks                            and benefits of the procedure and the sedation                            options and risks were discussed with the patient.                            All questions were answered, and informed consent                            was obtained. Prior Anticoagulants: The patient has                            taken no previous anticoagulant or antiplatelet                            agents. ASA Grade Assessment: II - A patient with                            mild systemic disease. After reviewing the risks  and benefits, the patient was deemed in                            satisfactory condition to undergo the procedure.                           After obtaining informed consent, the endoscope was                            passed under direct vision. Throughout the                            procedure, the patient's blood pressure, pulse, and                            oxygen saturations were monitored  continuously. The                            [id number]-H190 ([id number]) scope was introduced through the                            and advanced to the second part of duodenum. The                            upper GI endoscopy was accomplished without                            difficulty. The patient tolerated the procedure                            well. Scope In: 7:50:26 AM Scope Out: 7:55:18 AM Total Procedure Duration: 0 hours 4 minutes 52 seconds  Findings:      The examined esophagus was normal.      The entire examined stomach was normal aside from a small amount of       resiual gastric ontents easily suctioned out..      The duodenal bulb and second portion of the duodenum were normal. The       scope was withdrawn. Dilation was performed with a Maloney dilator with       mild resistance at 54 Fr. The scope was withdrawn. Dilation was       performed with a Maloney dilator with mild resistance at 56 Fr. The       dilation site was examined following endoscope reinsertion and showed no       change. Estimated blood loss was minimal. Impression:               - Normal esophagus. Dilated.                           - Normal stomach.                           - Normal duodenal bulb and second portion of the  duodenum.                           - No specimens collected. Moderate Sedation:      Moderate (conscious) sedation was personally administered by an       anesthesia professional. The following parameters were monitored: oxygen       saturation, heart rate, blood pressure, respiratory rate, EKG, adequacy       of pulmonary ventilation, and response to care. Recommendation:           - Patient has a contact number available for                            emergencies. The signs and symptoms of potential                            delayed complications were discussed with the                            patient. Return to normal activities tomorrow.                             Written discharge instructions were provided to the                            patient.                           - Advance diet as tolerated.                           - Continue present medications. Continue Protonix                            40 mg twice daily.                           - No repeat upper endoscopy                           - Return to GI office in 6 months. At patients                            request, I have discussed findings and                            recommendaitons with her friend, [name],                            [phone] Procedure Code(s):        --- Professional ---                           [phone], Esophagogastroduodenoscopy, flexible,                            transoral; diagnostic, including collection of  specimen(s) by brushing or washing, when performed                            (separate procedure)                           43450, Dilation of esophagus, by unguided sound or                            bougie, single or multiple passes Diagnosis Code(s):        --- Professional ---                           R13.10, Dysphagia, unspecified CPT copyright 2019 American Medical Association. All rights reserved. The codes documented in this report are preliminary and upon coder review may  be revised to meet current compliance requirements. Gerrit Friends. Rourk, MD Gennette Pac, MD 12/23/2018 8:23:57 AM This report has been signed electronically. Number of Addenda: 0

## 2018-12-23 NOTE — Anesthesia Preprocedure Evaluation (Signed)
Anesthesia Evaluation  Patient identified by MRN, date of birth, ID band Patient awake    Reviewed: Allergy & Precautions, NPO status , Patient's Chart, lab work & pertinent test results  Airway Mallampati: III  TM Distance: >3 FB Neck ROM: Full    Dental no notable dental hx.    Pulmonary sleep apnea and Continuous Positive Airway Pressure Ventilation , former smoker,    Pulmonary exam normal breath sounds clear to auscultation       Cardiovascular Exercise Tolerance: Poor hypertension, Pt. on medications negative cardio ROS Normal cardiovascular examII Rhythm:Regular Rate:Normal     Neuro/Psych  Headaches, Seizures -, Well Controlled,  Anxiety Depression Bipolar Disorder negative psych ROS   GI/Hepatic Neg liver ROS, GERD  Medicated and Controlled,  Endo/Other  Hypothyroidism   Renal/GU negative Renal ROS  negative genitourinary   Musculoskeletal  (+) Arthritis , Osteoarthritis,    Abdominal   Peds negative pediatric ROS (+)  Hematology negative hematology ROS (+)   Anesthesia Other Findings   Reproductive/Obstetrics negative OB ROS                             Anesthesia Physical Anesthesia Plan  ASA: III  Anesthesia Plan: General   Post-op Pain Management:    Induction: Intravenous  PONV Risk Score and Plan:   Airway Management Planned: Nasal Cannula and Simple Face Mask  Additional Equipment:   Intra-op Plan:   Post-operative Plan:   Informed Consent: I have reviewed the patients History and Physical, chart, labs and discussed the procedure including the risks, benefits and alternatives for the proposed anesthesia with the patient or authorized representative who has indicated his/her understanding and acceptance.     Dental advisory given  Plan Discussed with: CRNA  Anesthesia Plan Comments: (Full PPE use planned  GETA as needed )        Anesthesia Quick  Evaluation

## 2018-12-23 NOTE — Telephone Encounter (Signed)
Needs refill on Oxycodone/Acetaminophen 7.5-325  Mgs.  Qty  42  Sig: Take 1 tablet by mouth every 4 (four) hours as needed for up to 7 days for severe pain.  Patient states she uses [name] in [place]

## 2018-12-23 NOTE — Anesthesia Postprocedure Evaluation (Signed)
Anesthesia Post Note  Patient: Diane [name]  Procedure(s) Performed: ESOPHAGOGASTRODUODENOSCOPY (EGD) WITH PROPOFOL (N/A ) MALONEY DILATION (N/A )  Patient location during evaluation: PACU Anesthesia Type: General Level of consciousness: awake and oriented Pain management: pain level controlled Vital Signs Assessment: post-procedure vital signs reviewed and stable Respiratory status: spontaneous breathing Cardiovascular status: blood pressure returned to baseline and stable Postop Assessment: no apparent nausea or vomiting Anesthetic complications: no     Last Vitals:  Vitals:   12/23/18 0706  BP: (!) 158/80  Pulse: 76  Resp: 18  Temp: 36.9 C  SpO2: 93%    Last Pain:  Vitals:   12/23/18 0742  TempSrc:   PainSc: 0-No pain                 Masae Lukacs

## 2018-12-24 ENCOUNTER — Other Ambulatory Visit: Payer: Self-pay

## 2018-12-24 ENCOUNTER — Ambulatory Visit (HOSPITAL_COMMUNITY): Payer: Medicare Other | Admitting: Psychiatry

## 2018-12-25 ENCOUNTER — Encounter (HOSPITAL_COMMUNITY): Payer: Self-pay | Admitting: Internal Medicine

## 2018-12-30 ENCOUNTER — Other Ambulatory Visit: Payer: Self-pay | Admitting: Orthopedic Surgery

## 2018-12-30 DIAGNOSIS — G894 Chronic pain syndrome: Secondary | ICD-10-CM

## 2018-12-30 NOTE — Telephone Encounter (Signed)
Oxycodone-Acetaminophen  7.5/325 mg  Qty 42 Tablets  Take 1 tablet by mouth every 4 (four) hours as needed for up to 7 days for severe pain.   PATIENT USES [name] IN [place] 

## 2018-12-31 ENCOUNTER — Other Ambulatory Visit: Payer: Self-pay | Admitting: Gastroenterology

## 2018-12-31 MED ORDER — OXYCODONE-ACETAMINOPHEN 7.5-325 MG PO TABS: 1.0000 | ORAL_TABLET | ORAL | 0 refills | Status: DC | PRN

## 2019-01-01 DIAGNOSIS — Z79899 Other long term (current) drug therapy: Secondary | ICD-10-CM | POA: Diagnosis not present

## 2019-01-01 DIAGNOSIS — R296 Repeated falls: Secondary | ICD-10-CM | POA: Diagnosis not present

## 2019-01-01 DIAGNOSIS — M130 Polyarthritis, unspecified: Secondary | ICD-10-CM | POA: Diagnosis not present

## 2019-01-06 ENCOUNTER — Other Ambulatory Visit: Payer: Self-pay | Admitting: Orthopedic Surgery

## 2019-01-06 DIAGNOSIS — G894 Chronic pain syndrome: Secondary | ICD-10-CM

## 2019-01-06 MED ORDER — OXYCODONE-ACETAMINOPHEN 7.5-325 MG PO TABS: 1.0000 | ORAL_TABLET | ORAL | 0 refills | Status: DC | PRN

## 2019-01-06 NOTE — Telephone Encounter (Signed)
Patient requests refill on Oxycodone/Acfetaminophen 7.5-325 mgs.  Qty  42  Sig: Take 1 tablet by mouth every 4 (four) hours as needed for up to 7 days for severe pain.        Patient states she uses [name] in [place]

## 2019-01-10 ENCOUNTER — Telehealth: Payer: Self-pay | Admitting: Orthopedic Surgery

## 2019-01-10 DIAGNOSIS — G8929 Other chronic pain: Secondary | ICD-10-CM | POA: Diagnosis not present

## 2019-01-10 DIAGNOSIS — Z79899 Other long term (current) drug therapy: Secondary | ICD-10-CM | POA: Diagnosis not present

## 2019-01-10 DIAGNOSIS — S8991XA Unspecified injury of right lower leg, initial encounter: Secondary | ICD-10-CM | POA: Diagnosis not present

## 2019-01-10 DIAGNOSIS — E039 Hypothyroidism, unspecified: Secondary | ICD-10-CM | POA: Diagnosis not present

## 2019-01-10 DIAGNOSIS — I10 Essential (primary) hypertension: Secondary | ICD-10-CM | POA: Diagnosis not present

## 2019-01-10 DIAGNOSIS — Z96651 Presence of right artificial knee joint: Secondary | ICD-10-CM | POA: Diagnosis not present

## 2019-01-10 DIAGNOSIS — K219 Gastro-esophageal reflux disease without esophagitis: Secondary | ICD-10-CM | POA: Diagnosis not present

## 2019-01-10 DIAGNOSIS — M25561 Pain in right knee: Secondary | ICD-10-CM | POA: Diagnosis not present

## 2019-01-10 DIAGNOSIS — W19XXXA Unspecified fall, initial encounter: Secondary | ICD-10-CM | POA: Diagnosis not present

## 2019-01-10 DIAGNOSIS — Z7982 Long term (current) use of aspirin: Secondary | ICD-10-CM | POA: Diagnosis not present

## 2019-01-10 DIAGNOSIS — S80211A Abrasion, right knee, initial encounter: Secondary | ICD-10-CM | POA: Diagnosis not present

## 2019-01-10 NOTE — Telephone Encounter (Signed)
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 I told her that is an emergency and she needs to go to the emergency room now, she has voiced understanding  To you FYI

## 2019-01-10 NOTE — Telephone Encounter (Addendum)
Diane [name] called this morning at around 10:55 am.  She said that she fell on Monday landing on her right knee.  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  She that the knee has some "pus coming out of it."  I asked her how long this has been going on and she had to think for a minute before she told me that it started sometime Wednesday evening.  I told her that she should have called [place] before now.  I told her that I would send this message to the clinical staff but that Dr. Romeo Apple was only in the office for another hour.  She said for me to go ahead and ask them what she should do and that she is also going to call her PCP for advice.  She called back.  PCP offered her appointment for next Wednesday only

## 2019-01-13 ENCOUNTER — Other Ambulatory Visit: Payer: Self-pay | Admitting: Orthopedic Surgery

## 2019-01-13 DIAGNOSIS — G894 Chronic pain syndrome: Secondary | ICD-10-CM

## 2019-01-13 MED ORDER — OXYCODONE-ACETAMINOPHEN 7.5-325 MG PO TABS: 1.0000 | ORAL_TABLET | ORAL | 0 refills | Status: DC | PRN

## 2019-01-13 NOTE — Telephone Encounter (Signed)
Oxycodone-Acetaminophen  7.5/325 mg  Qty 42 Tablets  Take 1 tablet by mouth every 4 (four) hours as needed for up to 7 days for severe pain.   PATIENT USES [name] IN [place] 

## 2019-01-14 DIAGNOSIS — M48061 Spinal stenosis, lumbar region without neurogenic claudication: Secondary | ICD-10-CM | POA: Diagnosis not present

## 2019-01-20 ENCOUNTER — Other Ambulatory Visit: Payer: Self-pay | Admitting: Orthopedic Surgery

## 2019-01-20 DIAGNOSIS — G894 Chronic pain syndrome: Secondary | ICD-10-CM

## 2019-01-20 MED ORDER — OXYCODONE-ACETAMINOPHEN 7.5-325 MG PO TABS: 1.0000 | ORAL_TABLET | ORAL | 0 refills | Status: DC | PRN

## 2019-01-20 NOTE — Telephone Encounter (Signed)
Patient requests refill on Oxycodone/Acetaminophen 7.5-325  Mgs.  Qty  42  Sig: Take 1 tablet by mouth every 4 (four) hours as needed for up to 7 days for severe pain  Patient states she uses [facility] in [place] 

## 2019-01-27 ENCOUNTER — Other Ambulatory Visit: Payer: Self-pay | Admitting: Orthopedic Surgery

## 2019-01-27 DIAGNOSIS — G894 Chronic pain syndrome: Secondary | ICD-10-CM

## 2019-01-27 NOTE — Telephone Encounter (Signed)
Patient called for refill: oxyCODONE-acetaminophen (PERCOCET) 7.5-325 MG tablet 42 tablet       [name] 

## 2019-01-28 MED ORDER — OXYCODONE-ACETAMINOPHEN 7.5-325 MG PO TABS: 1.0000 | ORAL_TABLET | ORAL | 0 refills | Status: DC | PRN

## 2019-01-30 ENCOUNTER — Other Ambulatory Visit: Payer: Self-pay

## 2019-01-30 ENCOUNTER — Ambulatory Visit (INDEPENDENT_AMBULATORY_CARE_PROVIDER_SITE_OTHER): Payer: Medicare Other | Admitting: Gastroenterology

## 2019-01-30 ENCOUNTER — Encounter: Payer: Self-pay | Admitting: Gastroenterology

## 2019-01-30 DIAGNOSIS — Z0001 Encounter for general adult medical examination with abnormal findings: Secondary | ICD-10-CM | POA: Diagnosis not present

## 2019-01-30 DIAGNOSIS — Z780 Asymptomatic menopausal state: Secondary | ICD-10-CM | POA: Diagnosis not present

## 2019-01-30 DIAGNOSIS — Z1389 Encounter for screening for other disorder: Secondary | ICD-10-CM | POA: Diagnosis not present

## 2019-01-30 DIAGNOSIS — N958 Other specified menopausal and perimenopausal disorders: Secondary | ICD-10-CM

## 2019-01-30 DIAGNOSIS — I10 Essential (primary) hypertension: Secondary | ICD-10-CM | POA: Diagnosis not present

## 2019-01-30 DIAGNOSIS — K5900 Constipation, unspecified: Secondary | ICD-10-CM

## 2019-01-30 DIAGNOSIS — R1310 Dysphagia, unspecified: Secondary | ICD-10-CM

## 2019-01-30 DIAGNOSIS — E039 Hypothyroidism, unspecified: Secondary | ICD-10-CM | POA: Diagnosis not present

## 2019-01-30 DIAGNOSIS — N9489 Other specified conditions associated with female genital organs and menstrual cycle: Secondary | ICD-10-CM

## 2019-01-30 DIAGNOSIS — E781 Pure hyperglyceridemia: Secondary | ICD-10-CM | POA: Diagnosis not present

## 2019-01-30 DIAGNOSIS — Z0000 Encounter for general adult medical examination without abnormal findings: Secondary | ICD-10-CM | POA: Diagnosis not present

## 2019-01-30 DIAGNOSIS — G8929 Other chronic pain: Secondary | ICD-10-CM | POA: Diagnosis not present

## 2019-01-30 MED ORDER — NALDEMEDINE TOSYLATE 0.2 MG PO TABS: 0.2000 mg | ORAL_TABLET | Freq: Every day | ORAL | 3 refills | Status: DC

## 2019-01-30 MED ORDER — LUBIPROSTONE 24 MCG PO CAPS: 24.0000 ug | ORAL_CAPSULE | Freq: Two times a day (BID) | ORAL | 5 refills | Status: DC

## 2019-01-30 NOTE — Patient Instructions (Signed)
1. Take Amitiza BID with food every day for constipation. You can skip a day if you have diarrhea.  2. Take Symproic 0.2mg  once daily for constipation on days you take your pain medication.  3. Call if you continue having constipation and cramps. 4. We will contact you in 04/2019 to schedule a swallowing test.  5. We will contact you in 03/2019 to schedule pelvic ultrasound.  6. Return to the office in six months.

## 2019-01-30 NOTE — Progress Notes (Signed)
Primary Care Physician:  Elfredia Nevins, MD Primary GI:  Roetta Sessions, MD   Patient Location: Home  Provider Location: [facility] office  Reason for Phone Visit: Cramping  Persons present on the phone encounter, with roles: Patient, myself (provider), Elinor Parkinson, CMA (updated meds and allergies)  Total time (minutes) spent on medical discussion: 25 minutes  Due to COVID-19, visit was conducted using telephonic method (no video was available).  Visit was requested by patient.  Virtual Visit via Telephone only  I connected with  Ms. Bateman on 01/30/19 at  2:30 PM EDT by telephone and verified that I am speaking with the correct person using two identifiers.   I discussed the limitations, risks, security and privacy concerns of performing an evaluation and management service by telephone and the availability of in person appointments. I also discussed with the patient that there may be a patient responsible charge related to this service. The patient expressed understanding and agreed to proceed.  Chief Complaint  Patient presents with  . Abdominal Pain    Diarrhea     HPI:   Patient is a pleasant 68 year old female who presents for telephone visit regarding cramping.  Patient was seen in the office back in February for dysphagia.  She also has chronic GERD and constipation.  History of colonic adenomas, next colonoscopy due September 2021.  For constipation she has been treated with Linzess 290 mcg, Amitiza 24 mcg twice daily without good results.    She was put on Trulance after her last office visit but advised to continue Symproic once daily on days that she takes her pain medications.  Insurance denied Symproic, she had to try Relistor.  At her last office visit it was not clear whether she was taking it or not.  Patient was also advised that she needed to speak to her gynecologist about a pelvic ultrasound because of abnormal findings on CT in February 2020 with 4 cm  simple appearing left adnexal cyst, follow-up pelvic ultrasound recommended in 4 to 6 months.  She underwent EGD with esophageal dilation for dysphagia in April.  Esophagus was normal, dilation was performed.  Still having some problems swallowing. Has trouble swallowing food, difficult getting food to leave mouth and if tries to washes food down then gets strangles and coughs. Does ok with pills. Sometimes liquids come back up in nose. No heartburn. BM had been doing ok, having one 3 times per week. Then went to once per week. When finally gets urge to have BM, then has a lot of abdominal cramping. Cramping continues until after BM. Cramps like labor. Sometimes cramping but cannot get bowels to move. Sometimes takes a suppository. She currently has both Relistor and Symproic. She takes one or the other when she takes her pain pills. No other daily medications for her constipation. Lately has to take pain medication every day. No melena, brbpr. She does not have a gyn so she has not followed up regarding left adnexal cyst.       Current Outpatient Medications  Medication Sig Dispense Refill  . ALPRAZolam (XANAX) 0.5 MG tablet TAKE ONE TABLET BY MOUTH TWICE DAILY AND TAKE TWO (2) TABLETS AT BEDTIME 120 tablet 2  . amLODipine (NORVASC) 5 MG tablet Take 5 mg by mouth daily.    Marland Kitchen atorvastatin (LIPITOR) 20 MG tablet Take 20 mg by mouth daily.    . cyclobenzaprine (FLEXERIL) 10 MG tablet TAKE ONE TABLET BY MOUTH THREE TIMES DAILY AS NEEDED  FOR MUSCLE SPASMS. (Patient taking differently: Take 10 mg by mouth 3 (three) times daily as needed for muscle spasms. ) 90 tablet 5  . divalproex (DEPAKOTE) 250 MG DR tablet TAKE THREE (3) TABLETS BY MOUTH AT BEDTIME. 90 tablet 2  . FLUoxetine (PROZAC) 20 MG capsule TAKE ONE CAPSULE BY MOUTH DAILY. 30 capsule 2  . hydrocortisone (ANUSOL-HC) 2.5 % rectal cream Place 1 application rectally 2 (two) times daily. (Patient taking differently: Place 1 application rectally  daily as needed for hemorrhoids. ) 30 g 1  . levothyroxine (SYNTHROID, LEVOTHROID) 150 MCG tablet Take 150 mcg by mouth daily before breakfast.   5  . lisinopril (PRINIVIL,ZESTRIL) 10 MG tablet Take 10 mg by mouth daily.    . meloxicam (MOBIC) 7.5 MG tablet TAKE ONE TABLET BY MOUTH DAILY 30 tablet 5  . oxyCODONE-acetaminophen (PERCOCET) 7.5-325 MG tablet Take 1 tablet by mouth every 4 (four) hours as needed for up to 7 days for severe pain. 42 tablet 0  . pantoprazole (PROTONIX) 40 MG tablet TAKE ONE TABLET BY MOUTH TWICE DAILY. (Patient taking differently: Take 40 mg by mouth 2 (two) times daily. ) 60 tablet 5  . RELISTOR 150 MG TABS TAKE THREE (3) TABLETS BY MOUTH DAILY AS NEEDED FOR CONSTIPATION ON DAYS YOU TAKE YOUR PAIN MEDICATION. TAKE WITH WATER 30 MINUTES BEFORE BREAKFAST. 90 tablet 3  . zolpidem (AMBIEN) 10 MG tablet TAKE ONE TABLET BY MOUTH AT BEDTIME AS NEEDED FOR SLEEP. 30 tablet 2   No current facility-administered medications for this visit.      ROS:  General: Negative for anorexia, weight loss, fever, chills, fatigue, weakness. Eyes: Negative for vision changes.  ENT: Negative for hoarseness, difficulty swallowing , nasal congestion. CV: Negative for chest pain, angina, palpitations, dyspnea on exertion, peripheral edema.  Respiratory: Negative for dyspnea at rest, dyspnea on exertion, cough, sputum, wheezing.  GI: See history of present illness. GU:  Negative for dysuria, hematuria, urinary incontinence, urinary frequency, nocturnal urination.  MS: Negative for joint pain, low back pain.  Derm: Negative for rash or itching.  Neuro: Negative for weakness, abnormal sensation, seizure, frequent headaches, memory loss, confusion.  Psych: Negative for anxiety, depression, suicidal ideation, hallucinations.  Endo: Negative for unusual weight change.  Heme: Negative for bruising or bleeding. Allergy: Negative for rash or hives.   Observations/Objective: Pleasant female in NAD.  Otherwise exam unavailable.   Lab Results  Component Value Date   CREATININE 0.88 10/28/2018   BUN 12 10/28/2018   NA 137 10/28/2018   K 3.7 10/28/2018   CL 102 10/28/2018   CO2 26 10/28/2018   Lab Results  Component Value Date   ALT 27 10/28/2018   AST 22 10/28/2018   ALKPHOS 54 10/28/2018   BILITOT 0.9 10/28/2018   Lab Results  Component Value Date   WBC 6.2 10/28/2018   HGB 13.8 10/28/2018   HCT 45.3 10/28/2018   MCV 89.0 10/28/2018   PLT 210 10/28/2018   Lab Results  Component Value Date   LIPASE 19 10/28/2018    Assessment and Plan:  Pleasant 68 y/o female with history of chronic GERD, dysphagia, chronic constipation who presents for follow up.   GERD: typical symptoms well controlled on pantoprazole. Continue current regimen. Antireflux measures reinforced.   Dysphagia: ?oropharyngeal component vs esophageal motility vs upper esophageal sphincter abnormality. Will obtain BPE in near future. Patient wants to postpone couple of months due to COVID 19 pandemic.   Chronic constipation: poorly controlled. Associated with  significant cramping. Doubt she has obstruction. CT 10/2018 reassuring. She will take Symproic every day she takes her pain medication. Will add back Amitiza 24mcg BID, she will take every day, holding for diarrhea only. She will call if ongoing problems. Otherwise see her back in six months. Next TCS due 05/2020.   Left adnexal cyst, due for pelvic u/s in 03/2019. She does not have a gyn. We will make arrangements for u/s.   Follow Up Instructions:    I discussed the assessment and treatment plan with the patient. The patient was provided an opportunity to ask questions and all were answered. The patient agreed with the plan and demonstrated an understanding of the instructions. AVS mailed to patient's home address.   The patient was advised to call back or seek an in-person evaluation if the symptoms worsen or if the condition fails to improve as  anticipated.  I provided 25 minutes of non-face-to-face time during this encounter.   Tana CoastLeslie Desaray Marschner, PA-C

## 2019-02-04 ENCOUNTER — Encounter: Payer: Self-pay | Admitting: Internal Medicine

## 2019-02-04 NOTE — Progress Notes (Signed)
CC'ED TO PCP 

## 2019-02-05 ENCOUNTER — Other Ambulatory Visit: Payer: Self-pay | Admitting: Orthopedic Surgery

## 2019-02-05 DIAGNOSIS — G894 Chronic pain syndrome: Secondary | ICD-10-CM

## 2019-02-05 MED ORDER — OXYCODONE-ACETAMINOPHEN 7.5-325 MG PO TABS: 1.0000 | ORAL_TABLET | ORAL | 0 refills | Status: DC | PRN

## 2019-02-05 NOTE — Telephone Encounter (Signed)
Oxycodone-Acetaminophen 7.5/325 mg  Qty  42 Tablets  Take 1 tablet by mouth every 4(four) hours as needed for up to 7 days for severe pain.  PATIENT USES [name] IN [place] 

## 2019-02-06 ENCOUNTER — Other Ambulatory Visit: Payer: Self-pay

## 2019-02-06 ENCOUNTER — Ambulatory Visit (HOSPITAL_COMMUNITY): Payer: Medicare Other | Admitting: Psychiatry

## 2019-02-07 ENCOUNTER — Ambulatory Visit (HOSPITAL_COMMUNITY): Payer: Medicare Other | Admitting: Psychiatry

## 2019-02-10 ENCOUNTER — Other Ambulatory Visit: Payer: Self-pay | Admitting: Orthopedic Surgery

## 2019-02-10 DIAGNOSIS — G894 Chronic pain syndrome: Secondary | ICD-10-CM

## 2019-02-10 MED ORDER — OXYCODONE-ACETAMINOPHEN 7.5-325 MG PO TABS: 1.0000 | ORAL_TABLET | ORAL | 0 refills | Status: DC | PRN

## 2019-02-10 NOTE — Telephone Encounter (Signed)
Oxycodone-Acetaminophen  7.5/325 mg  Qty  42 Tablets  Take 1 tablet every 4 (four) hours as needed for up to 7 days for severe pain.  PATIENT USES [facility] IN [place]

## 2019-02-17 ENCOUNTER — Other Ambulatory Visit: Payer: Self-pay | Admitting: Orthopedic Surgery

## 2019-02-17 DIAGNOSIS — G894 Chronic pain syndrome: Secondary | ICD-10-CM

## 2019-02-17 MED ORDER — OXYCODONE-ACETAMINOPHEN 7.5-325 MG PO TABS: 1.0000 | ORAL_TABLET | ORAL | 0 refills | Status: DC | PRN

## 2019-02-17 NOTE — Telephone Encounter (Signed)
Oxycodone-Acetaminophen  7.5/325 mg  Qty 42 Tablets  Take 1 tablet by mouth every 4(four) hours as needed for up to 7 days for severe pain.  [name] [place]

## 2019-02-24 ENCOUNTER — Other Ambulatory Visit: Payer: Self-pay | Admitting: Orthopedic Surgery

## 2019-02-24 DIAGNOSIS — G894 Chronic pain syndrome: Secondary | ICD-10-CM

## 2019-02-24 MED ORDER — OXYCODONE-ACETAMINOPHEN 7.5-325 MG PO TABS: 1.0000 | ORAL_TABLET | ORAL | 0 refills | Status: DC | PRN

## 2019-02-24 NOTE — Telephone Encounter (Signed)
Oxycodone-Acetaminophen  7.5/325 mg  Qty 42 Tablets  Take 1 tablet by mouth every 4(four) hours as needed for up to 7 days for severe pain.  PATIENT USES [name] IN [place]

## 2019-02-27 ENCOUNTER — Other Ambulatory Visit: Payer: Self-pay | Admitting: Orthopedic Surgery

## 2019-02-27 ENCOUNTER — Other Ambulatory Visit (HOSPITAL_COMMUNITY): Payer: Self-pay | Admitting: Psychiatry

## 2019-02-27 DIAGNOSIS — S8002XA Contusion of left knee, initial encounter: Secondary | ICD-10-CM

## 2019-02-27 DIAGNOSIS — F3130 Bipolar disorder, current episode depressed, mild or moderate severity, unspecified: Secondary | ICD-10-CM

## 2019-02-27 DIAGNOSIS — M25562 Pain in left knee: Secondary | ICD-10-CM

## 2019-02-27 NOTE — Telephone Encounter (Signed)
Please schedule appointment.

## 2019-02-27 NOTE — Telephone Encounter (Signed)
SCHEDULED  6/26 @ 11:20 AM

## 2019-03-01 ENCOUNTER — Other Ambulatory Visit (HOSPITAL_COMMUNITY): Payer: Self-pay | Admitting: Psychiatry

## 2019-03-01 DIAGNOSIS — F5105 Insomnia due to other mental disorder: Secondary | ICD-10-CM

## 2019-03-03 ENCOUNTER — Other Ambulatory Visit: Payer: Self-pay | Admitting: Orthopedic Surgery

## 2019-03-03 DIAGNOSIS — G4733 Obstructive sleep apnea (adult) (pediatric): Secondary | ICD-10-CM | POA: Diagnosis not present

## 2019-03-03 DIAGNOSIS — G894 Chronic pain syndrome: Secondary | ICD-10-CM

## 2019-03-03 MED ORDER — OXYCODONE-ACETAMINOPHEN 7.5-325 MG PO TABS: 1.0000 | ORAL_TABLET | ORAL | 0 refills | Status: DC | PRN

## 2019-03-03 NOTE — Telephone Encounter (Signed)
Please call for f/u 

## 2019-03-03 NOTE — Telephone Encounter (Signed)
UNABLE TO REACH PATIENT . PHONE HAS A FAST BUSY // SIGNAL NO RINGING

## 2019-03-03 NOTE — Telephone Encounter (Signed)
Oxycodone-Acetaminophen  7.5/325 mg  Qty 42 Tablets  Take 1 tablet by mouth every 4 (four) hours as needed for up to 7 days for severe pain.  PATIENT USES [facility] IN [place]  

## 2019-03-07 ENCOUNTER — Ambulatory Visit (INDEPENDENT_AMBULATORY_CARE_PROVIDER_SITE_OTHER): Payer: Medicare Other | Admitting: Psychiatry

## 2019-03-07 ENCOUNTER — Encounter (HOSPITAL_COMMUNITY): Payer: Self-pay | Admitting: Psychiatry

## 2019-03-07 ENCOUNTER — Other Ambulatory Visit: Payer: Self-pay

## 2019-03-07 DIAGNOSIS — F3130 Bipolar disorder, current episode depressed, mild or moderate severity, unspecified: Secondary | ICD-10-CM

## 2019-03-07 DIAGNOSIS — F5105 Insomnia due to other mental disorder: Secondary | ICD-10-CM

## 2019-03-07 MED ORDER — FLUOXETINE HCL 20 MG PO CAPS: 20.0000 mg | ORAL_CAPSULE | Freq: Every day | ORAL | 2 refills | Status: DC

## 2019-03-07 MED ORDER — DIVALPROEX SODIUM 250 MG PO DR TAB: DELAYED_RELEASE_TABLET | ORAL | 2 refills | Status: DC

## 2019-03-07 MED ORDER — ZOLPIDEM TARTRATE 10 MG PO TABS: 10.0000 mg | ORAL_TABLET | Freq: Every evening | ORAL | 2 refills | Status: DC | PRN

## 2019-03-07 MED ORDER — ALPRAZOLAM 0.5 MG PO TABS: ORAL_TABLET | ORAL | 2 refills | Status: DC

## 2019-03-07 NOTE — Progress Notes (Signed)
Virtual Visit via Telephone Note  I connected with Diane [name] on 03/07/19 at 11:20 AM EDT by telephone and verified that I am speaking with the correct person using two identifiers.   I discussed the limitations, risks, security and privacy concerns of performing an evaluation and management service by telephone and the availability of in person appointments. I also discussed with the patient that there may be a patient responsible charge related to this service. The patient expressed understanding and agreed to proceed.     I discussed the assessment and treatment plan with the patient. The patient was provided an opportunity to ask questions and all were answered. The patient agreed with the plan and demonstrated an understanding of the instructions.   The patient was advised to call back or seek an in-person evaluation if the symptoms worsen or if the condition fails to improve as anticipated.  I provided 15 minutes of non-face-to-face time during this encounter.   Diane Rudereborah Ross, MD  [facility] MD/PA/NP OP Progress Note  03/07/2019 11:46 AM Diane [name]  MRN:  [id number]  Chief Complaint:  Chief Complaint    Depression; Anxiety; Follow-up     HPI: This patient is a 68 year old black female with a history of bipolar disorder who comes in for follow-up today.  She is on disability and living alone in [place].  The patient returns after about 6 months.  She has missed some appointments.  She is assessed via phone today due to the coronavirus pandemic.  She states that she has had a lot of conflicts with her family.  Her family states that they saw her boyfriend's truck at someone else's house and accused him of seeing another woman.  This caused a lot of conflict between the patient and her boyfriend and he has moved out.  She is also been very angry with her family because she thinks they have blown things out of proportion.  She has been more upset and tearful.  She has not been taking her  medication as prescribed and has been drinking again.  I explained to her that I cannot help her if she continues to drink.  She is drinking about 3 beers and 2 shots of liquor a day.  Her boyfriend did come over yesterday and they are trying to talk things out and hopefully will get back together.  She promises me that she will stop the drinking and be more compliant with her medications.  She denies any thoughts of self-harm or suicide. Visit Diagnosis:    ICD-10-CM   1. Bipolar I disorder, most recent episode depressed (HCC)  F31.30 divalproex (DEPAKOTE) 250 MG DR tablet  2. Insomnia due to mental disorder  F51.05 ALPRAZolam (XANAX) 0.5 MG tablet    Past Psychiatric History: She has a history of one [facility] in [facility].  At that time she was using drugs and alcohol.  She has had outpatient treatment ever since  Past Medical History:  Past Medical History:  Diagnosis Date  . Allergic rhinitis   . Anxiety   . Arthritis   . Cataracts, bilateral   . Chronic back pain    Sees pain mamagement clinic  . Chronic pain syndrome   . Constipation   . Depression   . GERD (gastroesophageal reflux disease)   . Headaches, cluster 3 yrs ago   last one  . High cholesterol   . Hypertension   . Hypothyroid   . Immature cataract   .  Limp    right leg  . Mania (HCC)   . PTSD (post-traumatic stress disorder)   . Right lumbar radiculopathy   . Seizures (HCC) 20 yrs ago   unknown etiology-on meds  . Sleep apnea    STOP BANG score=4  . 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[facility])     Past Surgical History:  Procedure Laterality Date  . BACK SURGERY    . CATARACT EXTRACTION W/PHACO Left 01/04/2016   Procedure: CATARACT EXTRACTION PHACO AND INTRAOCULAR LENS PLACEMENT (IOC);  Surgeon: Jethro Bolus, MD;  Location: [facility];  Service: Ophthalmology;  Laterality: Left;  CDE:4.70  . CATARACT EXTRACTION W/PHACO Right 01/18/2016   Procedure: CATARACT EXTRACTION PHACO AND  INTRAOCULAR LENS PLACEMENT RIGHT EYE; CDE:  4.66;  Surgeon: Jethro Bolus, MD;  Location: [facility];  Service: Ophthalmology;  Laterality: Right;  . CHOLECYSTECTOMY    . COLONOSCOPY  10/16/2008   LKG:[id number], otherwise normal rectum; pancolonic diverticula the remainder of the colonic mucosa appeared normal. Next TCS due 10/2013.  Marland Kitchen COLONOSCOPY  06/28/2006   RMR: Internal hemorrhoids. Diminutive rectal polyps, cold biopsied/ Pancolonic diverticula. Polyps in the right colon removed   . COLONOSCOPY WITH PROPOFOL N/A 05/20/2015   RMR: Pancolonic diverticulosis. Redundant colon   . ESOPHAGOGASTRODUODENOSCOPY  10/16/2008   YQI:HKVQQ hiatal hernia otherwise normal esophagus, stomach  D1, D2, status post passage of a 56-French Maloney dilator  . ESOPHAGOGASTRODUODENOSCOPY  06/28/2006   VZD:GLOVFI esophagus. Small hiatal hernia. Otherwise normal stomach, D1 and D2, status post passage of a 56 [place] Maloney dilator  . ESOPHAGOGASTRODUODENOSCOPY (EGD) WITH ESOPHAGEAL DILATION N/A 07/14/2013   RMR: Abnormal esophagus of uncertain significance-status post esophageal biospy. small hiatal hernia.   Marland Kitchen ESOPHAGOGASTRODUODENOSCOPY (EGD) WITH PROPOFOL N/A 05/20/2015   RMR: NORMAL EGD status post maloney dilation   . ESOPHAGOGASTRODUODENOSCOPY (EGD) WITH PROPOFOL N/A 12/23/2018   Dr. Jena Gauss: normal esophagus, s/p esophageal dilation.   Marland Kitchen FOOT SURGERY     left foot-  . INCISION AND DRAINAGE Right 01/03/2013   Procedure: INCISION AND DRAINAGE;  Surgeon: Vickki Hearing, MD;  Location: [facility];  Service: Orthopedics;  Laterality: Right;  . KNEE ARTHROSCOPY  01/2012   right  . KNEE ARTHROSCOPY WITH LATERAL RELEASE Right 01/03/2013   Procedure: Lateral Release Patella Right Knee;  Surgeon: Vickki Hearing, MD;  Location: [facility];  Service: Orthopedics;  Laterality: Right;  . left foot  x 2  . LUMBAR DISC SURGERY  L4-5  . MALONEY DILATION N/A 05/20/2015   Procedure: Elease Hashimoto DILATION;  Surgeon: Corbin Ade, MD;   Location: [facility];  Service: Endoscopy;  Laterality: N/A;  Maloney dilator # 54  . MALONEY DILATION N/A 12/23/2018   Procedure: Elease Hashimoto DILATION;  Surgeon: Corbin Ade, MD;  Location: [facility];  Service: Endoscopy;  Laterality: N/A;  . oophoectomy  bilateral  . PARTIAL HYSTERECTOMY    . TOTAL KNEE ARTHROPLASTY  07/01/2012   Procedure: TOTAL KNEE ARTHROPLASTY;  Surgeon: Vickki Hearing, MD;  Location: [facility];  Service: Orthopedics;  Laterality: Right;  . TOTAL KNEE REVISION Right 01/03/2013   Procedure: Patellaplasty Right Knee;  Surgeon: Vickki Hearing, MD;  Location: [facility];  Service: Orthopedics;  Laterality: Right;    Family Psychiatric History: See below  Family History:  Family History  Problem Relation Age of Onset  . Alcohol abuse Mother   . Anxiety disorder Mother   . Alcohol abuse Father   . Anxiety disorder Father   . Bipolar disorder Sister   .  Alcohol abuse Brother   . Anxiety disorder Brother   . Drug abuse Brother   . Dementia Paternal Aunt   . ADD / ADHD Grandchild   . ADD / ADHD Grandchild   . Drug abuse Brother   . Alcohol abuse Brother   . Anxiety disorder Brother   . Seizures Brother   . Alcohol abuse Brother   . Anxiety disorder Brother   . Seizures Brother   . Alcohol abuse Brother   . Anxiety disorder Brother   . Alcohol abuse Brother   . Anxiety disorder Brother   . Heart disease Other   . Diabetes Other   . Alcohol abuse Other   . Depression Neg Hx   . OCD Neg Hx   . Paranoid behavior Neg Hx   . Schizophrenia Neg Hx   . Sexual abuse Neg Hx   . Physical abuse Neg Hx   . Colon cancer Neg Hx     Social History:  Social History   Socioeconomic History  . Marital status: Legally Separated    Spouse name: Not on file  . Number of children: Not on file  . Years of education: 12th grade  . Highest education level: Not on file  Occupational History  . Occupation: disabled    Associate Professormployer: UNEMPLOYED  Social Needs  . Financial  resource strain: Not on file  . Food insecurity    Worry: Not on file    Inability: Not on file  . Transportation needs    Medical: Not on file    Non-medical: Not on file  Tobacco Use  . Smoking status: Former Smoker    Packs/day: 0.25    Years: 5.00    Pack years: 1.25    Types: Cigarettes    Quit date: 06/26/2003    Years since quitting: 15.7  . Smokeless tobacco: Never Used  Substance and Sexual Activity  . Alcohol use: Yes    Alcohol/week: 0.0 standard drinks    Comment: occasionally; once a month.  . Drug use: No    Comment: has a past history of street drug use  . Sexual activity: Never    Birth control/protection: Surgical  Lifestyle  . Physical activity    Days per week: Not on file    Minutes per session: Not on file  . Stress: Not on file  Relationships  . Social Musicianconnections    Talks on phone: Not on file    Gets together: Not on file    Attends religious service: Not on file    Active member of club or organization: Not on file    Attends meetings of clubs or organizations: Not on file    Relationship status: Not on file  Other Topics Concern  . Not on file  Social History Narrative  . Not on file    Allergies:   Metabolic Disorder Labs: Lab Results  Component Value Date   HGBA1C 5.8 (H) 01/25/2012   MPG 120 (H) 01/25/2012   No results found for: PROLACTIN Lab Results  Component Value Date   CHOL  12/10/2010    69        ATP III CLASSIFICATION:  <200     mg/dL   Desirable  [id number]  mg/dL   Borderline High  >=045>=240    mg/dL   High          TRIG 38 12/10/2010   HDL 28 (L) 12/10/2010   CHOLHDL 2.5 12/10/2010   VLDL 8 12/10/2010  LDLCALC  12/10/2010    33        Total Cholesterol/HDL:CHD Risk Coronary Heart Disease Risk Table                     Men   Women  1/2 Average Risk   3.4   3.3  Average Risk       5.0   4.4  2 X Average Risk   9.6   7.1  3 X Average Risk  23.4   11.0        Use the calculated Patient Ratio above and the CHD  Risk Table to determine the patient's CHD Risk.        ATP III CLASSIFICATION (LDL):  <100     mg/dL   Optimal  [id number]  mg/dL   Near or Above                    Optimal  130-159  mg/dL   Borderline  [id number]  mg/dL   High  >956>190     mg/dL   Very High   LDLCALC 71 07/12/2009   Lab Results  Component Value Date   TSH 0.52 02/11/2018   TSH <0.008 (L) 12/09/2010    Therapeutic Level Labs: No results found for: LITHIUM Lab Results  Component Value Date   VALPROATE 70.7 09/24/2018   VALPROATE 71.5 01/30/2017   No components found for:  CBMZ  Current Medications: Current Outpatient Medications  Medication Sig Dispense Refill  . ALPRAZolam (XANAX) 0.5 MG tablet Take one twice a day and two at bedtime 120 tablet 2  . amLODipine (NORVASC) 5 MG tablet Take 5 mg by mouth daily.    Marland Kitchen. atorvastatin (LIPITOR) 20 MG tablet Take 20 mg by mouth daily.    . cyclobenzaprine (FLEXERIL) 10 MG tablet TAKE ONE TABLET BY MOUTH THREE TIMES DAILY AS NEEDED FOR MUSCLE SPASMS. (Patient taking differently: Take 10 mg by mouth 3 (three) times daily as needed for muscle spasms. ) 90 tablet 5  . divalproex (DEPAKOTE) 250 MG DR tablet TAKE THREE (3) TABLETS BY MOUTH AT BEDTIME. 90 tablet 2  . FLUoxetine (PROZAC) 20 MG capsule Take 1 capsule (20 mg total) by mouth daily. 30 capsule 2  . hydrocortisone (ANUSOL-HC) 2.5 % rectal cream Place 1 application rectally 2 (two) times daily. (Patient taking differently: Place 1 application rectally daily as needed for hemorrhoids. ) 30 g 1  . levothyroxine (SYNTHROID, LEVOTHROID) 150 MCG tablet Take 150 mcg by mouth daily before breakfast.   5  . lisinopril (PRINIVIL,ZESTRIL) 10 MG tablet Take 10 mg by mouth daily.    Marland Kitchen. lubiprostone (AMITIZA) 24 MCG capsule Take 1 capsule (24 mcg total) by mouth 2 (two) times daily with a meal. 60 capsule 5  . meloxicam (MOBIC) 7.5 MG tablet TAKE ONE TABLET BY MOUTH DAILY 30 tablet 5  . Naldemedine Tosylate (SYMPROIC) 0.2 MG TABS Take 0.2  mg by mouth daily. For constipation on days you take your pain medication. 30 tablet 3  . oxyCODONE-acetaminophen (PERCOCET) 7.5-325 MG tablet Take 1 tablet by mouth every 4 (four) hours as needed for up to 7 days for severe pain. 42 tablet 0  . pantoprazole (PROTONIX) 40 MG tablet TAKE ONE TABLET BY MOUTH TWICE DAILY. (Patient taking differently: Take 40 mg by mouth 2 (two) times daily. ) 60 tablet 5  . zolpidem (AMBIEN) 10 MG tablet Take 1 tablet (10 mg total) by mouth at bedtime as needed.  for sleep 30 tablet 2   No current facility-administered medications for this visit.      Musculoskeletal: Strength & Muscle Tone: within normal limits Gait & Station: normal Patient leans: N/A  Psychiatric Specialty Exam: Review of Systems  Psychiatric/Behavioral: Positive for depression. The patient has insomnia.   All other systems reviewed and are negative.   There were no vitals taken for this visit.There is no height or weight on file to calculate BMI.  General Appearance: NA  Eye Contact:  NA  Speech:  Clear and Coherent  Volume:  Decreased  Mood:  Depressed  Affect:  NA  Thought Process:  Goal Directed  Orientation:  Full (Time, Place, and Person)  Thought Content: Rumination   Suicidal Thoughts:  No  Homicidal Thoughts:  No  Memory:  Immediate;   Good Recent;   Fair Remote;   Fair  Judgement:  Poor  Insight:  Shallow  Psychomotor Activity:  Decreased  Concentration:  Concentration: Fair and Attention Span: Fair  Recall:  [employer] of Knowledge: Fair  Language: Good  Akathisia:  No  Handed:  Right  AIMS (if indicated): not done  Assets:  Communication Skills Desire for Improvement Resilience Social Support Talents/Skills  ADL's:  Intact  Cognition: WNL  Sleep:  Fair   Screenings:   Assessment and Plan: This patient is a 68 year old female with a history of depression and anxiety.  She has had a lot of altercations with her family as well as her boyfriend and is  very stressed.  I explained to her that the drinking is not going to help.  I will renew her medications which include Prozac 20 mg daily for depression, Xanax 0.5 mg daily and 1mg  at bedtime, Depakote 750 mg at bedtime and Ambien 10 mg at night for sleep.  She will return to see me in 6 weeks   Levonne Spiller, MD 03/07/2019, 11:46 AM

## 2019-03-10 ENCOUNTER — Other Ambulatory Visit: Payer: Self-pay

## 2019-03-10 DIAGNOSIS — G894 Chronic pain syndrome: Secondary | ICD-10-CM

## 2019-03-10 MED ORDER — OXYCODONE-ACETAMINOPHEN 7.5-325 MG PO TABS: 1.0000 | ORAL_TABLET | ORAL | 0 refills | Status: DC | PRN

## 2019-03-10 NOTE — Telephone Encounter (Signed)
Oxycodone-Acetaminophen  7.5/325 MG  Qty  42 Tablets  Take 1 tablet by mouth every 4 (four) hours as needed for up to 7 days for severe pain.  PATIENT USES [name]

## 2019-03-17 ENCOUNTER — Other Ambulatory Visit: Payer: Self-pay | Admitting: Orthopedic Surgery

## 2019-03-17 DIAGNOSIS — G894 Chronic pain syndrome: Secondary | ICD-10-CM

## 2019-03-17 MED ORDER — OXYCODONE-ACETAMINOPHEN 7.5-325 MG PO TABS: 1.0000 | ORAL_TABLET | ORAL | 0 refills | Status: DC | PRN

## 2019-03-17 NOTE — Telephone Encounter (Signed)
Patient called to request refill: oxyCODONE-acetaminophen (PERCOCET) 7.5-325 MG tablet / 42 tablets  - Pharmacy: [facility]* *patient's last visit with Dr Aline Brochure 07/24/18, next due 07/23/19 - please advise if need to schedule a telephone visit or just refill

## 2019-03-24 ENCOUNTER — Other Ambulatory Visit: Payer: Self-pay

## 2019-03-24 DIAGNOSIS — G894 Chronic pain syndrome: Secondary | ICD-10-CM

## 2019-03-24 MED ORDER — OXYCODONE-ACETAMINOPHEN 7.5-325 MG PO TABS: 1.0000 | ORAL_TABLET | ORAL | 0 refills | Status: DC | PRN

## 2019-03-24 NOTE — Telephone Encounter (Signed)
Oxycodone-Acetaminophen  7.5/325 mg  Qty 42 Tablets  Take 1 tablet by mouth every 4 (four) hours as needed for up to 7 days for severe pain.   PATIENT USES [name] IN [place]

## 2019-03-25 ENCOUNTER — Ambulatory Visit: Payer: Self-pay | Admitting: Gastroenterology

## 2019-03-27 ENCOUNTER — Other Ambulatory Visit: Payer: Self-pay | Admitting: Nurse Practitioner

## 2019-03-27 DIAGNOSIS — K219 Gastro-esophageal reflux disease without esophagitis: Secondary | ICD-10-CM

## 2019-03-31 ENCOUNTER — Other Ambulatory Visit: Payer: Self-pay | Admitting: Orthopedic Surgery

## 2019-03-31 DIAGNOSIS — G894 Chronic pain syndrome: Secondary | ICD-10-CM

## 2019-03-31 MED ORDER — OXYCODONE-ACETAMINOPHEN 7.5-325 MG PO TABS: 1.0000 | ORAL_TABLET | ORAL | 0 refills | Status: DC | PRN

## 2019-03-31 NOTE — Telephone Encounter (Signed)
Patient requests refill on Oxycodone/Acetaminophen 7.5-325 mgs.  Qty 42  Sig: Take 1 tablet by mouth every 4 (four) hours as needed for up to 7 days for severe pain.  Patient states she uses [facility] in [place]

## 2019-04-07 ENCOUNTER — Other Ambulatory Visit: Payer: Self-pay

## 2019-04-07 DIAGNOSIS — G894 Chronic pain syndrome: Secondary | ICD-10-CM

## 2019-04-07 MED ORDER — OXYCODONE-ACETAMINOPHEN 7.5-325 MG PO TABS: 1.0000 | ORAL_TABLET | ORAL | 0 refills | Status: DC | PRN

## 2019-04-07 NOTE — Telephone Encounter (Signed)
Oxycodone 7.5/325 mg  Qty 42 Tablets  Take 1 tablet by mouth every 4 (four) hours as needed for up to 7 days for severe pain.  PATIENT USES [facility] IN [place]

## 2019-04-14 ENCOUNTER — Other Ambulatory Visit: Payer: Self-pay | Admitting: Orthopedic Surgery

## 2019-04-14 DIAGNOSIS — G894 Chronic pain syndrome: Secondary | ICD-10-CM

## 2019-04-14 MED ORDER — OXYCODONE-ACETAMINOPHEN 7.5-325 MG PO TABS: 1.0000 | ORAL_TABLET | ORAL | 0 refills | Status: DC | PRN

## 2019-04-14 NOTE — Telephone Encounter (Signed)
Patient requests refill on Oxycodone/Acetaminophen 7.5-325  Mgs.  Qty  42  Sig: Take 1 tablet by mouth every 4 (four) hours as needed for up to 7 days for severe pain  Patient states she uses [facility] in [place] 

## 2019-04-21 ENCOUNTER — Other Ambulatory Visit: Payer: Self-pay

## 2019-04-21 ENCOUNTER — Other Ambulatory Visit: Payer: Self-pay | Admitting: Orthopedic Surgery

## 2019-04-21 ENCOUNTER — Ambulatory Visit (HOSPITAL_COMMUNITY): Payer: Medicare Other | Admitting: Psychiatry

## 2019-04-21 DIAGNOSIS — G894 Chronic pain syndrome: Secondary | ICD-10-CM

## 2019-04-21 NOTE — Telephone Encounter (Signed)
Patient requests refill on Oxycodone/Acetaminophen 7.5-325  Mgs.  Qty  42  Sig: Take 1 tablet by mouth every 4 (four) hours as needed for up to 7 days for severe pain  Patient states she uses [facility] in [place] 

## 2019-04-22 MED ORDER — OXYCODONE-ACETAMINOPHEN 7.5-325 MG PO TABS: 1.0000 | ORAL_TABLET | ORAL | 0 refills | Status: DC | PRN

## 2019-04-28 ENCOUNTER — Telehealth: Payer: Self-pay | Admitting: Orthopedic Surgery

## 2019-04-28 DIAGNOSIS — G894 Chronic pain syndrome: Secondary | ICD-10-CM

## 2019-04-28 NOTE — Telephone Encounter (Signed)
Patient of Dr Ruthe Mannan requests refill on Oxycodone/Acetaminophen 7.5-325  Mgs.  Qty 42  Sig: Take 1 tablet by mouth every 4 (four) hours as needed for up to 7 days for severe pain.  Patient states she uses [facility]

## 2019-04-29 MED ORDER — OXYCODONE-ACETAMINOPHEN 7.5-325 MG PO TABS: 1.0000 | ORAL_TABLET | ORAL | 0 refills | Status: DC | PRN

## 2019-05-05 ENCOUNTER — Other Ambulatory Visit: Payer: Self-pay

## 2019-05-05 DIAGNOSIS — G894 Chronic pain syndrome: Secondary | ICD-10-CM

## 2019-05-05 MED ORDER — OXYCODONE-ACETAMINOPHEN 7.5-325 MG PO TABS: 1.0000 | ORAL_TABLET | ORAL | 0 refills | Status: DC | PRN

## 2019-05-05 NOTE — Telephone Encounter (Signed)
Oxycodone-Acetaminophen  7.5/325 mg  Qty 42 Tablets  Take 1 tablet by mouth every 4 (four) hours as needed for up to 7 days for severe pain.  PATIENT USES [facility] IN [place]

## 2019-05-12 ENCOUNTER — Other Ambulatory Visit: Payer: Self-pay | Admitting: Orthopedic Surgery

## 2019-05-12 DIAGNOSIS — G894 Chronic pain syndrome: Secondary | ICD-10-CM

## 2019-05-12 MED ORDER — OXYCODONE-ACETAMINOPHEN 7.5-325 MG PO TABS: 1.0000 | ORAL_TABLET | ORAL | 0 refills | Status: DC | PRN

## 2019-05-12 NOTE — Telephone Encounter (Signed)
Patient called for refill:  oxyCODONE-acetaminophen (PERCOCET) 7.5-325 MG tablet 42 tablet  -[name], [place]  

## 2019-05-20 ENCOUNTER — Other Ambulatory Visit: Payer: Self-pay | Admitting: Radiology

## 2019-05-20 DIAGNOSIS — G894 Chronic pain syndrome: Secondary | ICD-10-CM

## 2019-05-20 MED ORDER — OXYCODONE-ACETAMINOPHEN 7.5-325 MG PO TABS: 1.0000 | ORAL_TABLET | ORAL | 0 refills | Status: DC | PRN

## 2019-05-20 NOTE — Telephone Encounter (Signed)
Patient called, asked for refill of pain meds- percocet.  [name], [place].

## 2019-05-23 ENCOUNTER — Ambulatory Visit (HOSPITAL_COMMUNITY)
Admission: RE | Admit: 2019-05-23 | Discharge: 2019-05-23 | Disposition: A | Discharge status: Home / Self Care | Payer: Medicare Other | Source: Ambulatory Visit | Attending: Family Medicine | Admitting: Family Medicine

## 2019-05-23 ENCOUNTER — Encounter (HOSPITAL_COMMUNITY): Payer: Self-pay

## 2019-05-23 ENCOUNTER — Other Ambulatory Visit: Payer: Self-pay

## 2019-05-23 ENCOUNTER — Other Ambulatory Visit (HOSPITAL_COMMUNITY): Payer: Self-pay | Admitting: Family Medicine

## 2019-05-23 DIAGNOSIS — S79912A Unspecified injury of left hip, initial encounter: Secondary | ICD-10-CM | POA: Diagnosis not present

## 2019-05-23 DIAGNOSIS — M25552 Pain in left hip: Secondary | ICD-10-CM | POA: Diagnosis not present

## 2019-05-23 DIAGNOSIS — Z23 Encounter for immunization: Secondary | ICD-10-CM | POA: Diagnosis not present

## 2019-05-26 ENCOUNTER — Other Ambulatory Visit: Payer: Self-pay | Admitting: Orthopedic Surgery

## 2019-05-26 DIAGNOSIS — S8002XA Contusion of left knee, initial encounter: Secondary | ICD-10-CM

## 2019-05-26 DIAGNOSIS — M25562 Pain in left knee: Secondary | ICD-10-CM

## 2019-05-27 ENCOUNTER — Other Ambulatory Visit: Payer: Self-pay | Admitting: Orthopedic Surgery

## 2019-05-27 DIAGNOSIS — G894 Chronic pain syndrome: Secondary | ICD-10-CM

## 2019-05-27 NOTE — Telephone Encounter (Signed)
Patient requests refill: oxyCODONE-acetaminophen (PERCOCET) 7.5-325 MG tablet -[name], [place] 

## 2019-05-28 MED ORDER — OXYCODONE-ACETAMINOPHEN 7.5-325 MG PO TABS: 1.0000 | ORAL_TABLET | ORAL | 0 refills | Status: DC | PRN

## 2019-06-02 ENCOUNTER — Other Ambulatory Visit: Payer: Self-pay | Admitting: Orthopedic Surgery

## 2019-06-02 DIAGNOSIS — G894 Chronic pain syndrome: Secondary | ICD-10-CM

## 2019-06-02 MED ORDER — OXYCODONE-ACETAMINOPHEN 7.5-325 MG PO TABS: 1.0000 | ORAL_TABLET | ORAL | 0 refills | Status: DC | PRN

## 2019-06-02 NOTE — Telephone Encounter (Signed)
Patient called for refill: oxyCODONE-acetaminophen (PERCOCET) 7.5-325 MG tablet 42 tablet  -[place]

## 2019-06-05 ENCOUNTER — Telehealth (HOSPITAL_COMMUNITY): Payer: Self-pay

## 2019-06-05 NOTE — Telephone Encounter (Signed)
Medication management - Attempted to reach pt after she left a message she had not slept the previous night and was requesting Dr. Harrington Challenger provide her something for sleep.  Informed pt. it appeared she missed her appt 04/21/19 and to call back to reschedule. Questioned patient if she was out of Ambien and other medications, last provided 03/07/19 + 2 refills.  Agreed on message to send her request to Dr. Harrington Challenger but again requested patient call back to discuss further and to make a new appointment for a virtual visit with Dr. Harrington Challenger.

## 2019-06-05 NOTE — Telephone Encounter (Signed)
I won't send in refills until an appointment is scheduled

## 2019-06-06 ENCOUNTER — Encounter (HOSPITAL_COMMUNITY): Payer: Self-pay | Admitting: Psychiatry

## 2019-06-06 ENCOUNTER — Other Ambulatory Visit: Payer: Self-pay

## 2019-06-06 ENCOUNTER — Ambulatory Visit (INDEPENDENT_AMBULATORY_CARE_PROVIDER_SITE_OTHER): Payer: Medicare Other | Admitting: Psychiatry

## 2019-06-06 DIAGNOSIS — F3130 Bipolar disorder, current episode depressed, mild or moderate severity, unspecified: Secondary | ICD-10-CM | POA: Diagnosis not present

## 2019-06-06 DIAGNOSIS — F5105 Insomnia due to other mental disorder: Secondary | ICD-10-CM

## 2019-06-06 MED ORDER — FLUOXETINE HCL 20 MG PO CAPS: 20.0000 mg | ORAL_CAPSULE | Freq: Every day | ORAL | 2 refills | Status: DC

## 2019-06-06 MED ORDER — TRAZODONE HCL 150 MG PO TABS: 150.0000 mg | ORAL_TABLET | Freq: Every day | ORAL | 2 refills | Status: DC

## 2019-06-06 MED ORDER — ALPRAZOLAM 0.5 MG PO TABS: ORAL_TABLET | ORAL | 2 refills | Status: DC

## 2019-06-06 MED ORDER — DIVALPROEX SODIUM 250 MG PO DR TAB: DELAYED_RELEASE_TABLET | ORAL | 2 refills | Status: DC

## 2019-06-06 NOTE — Progress Notes (Signed)
Virtual Visit via Telephone Note  I connected with Diane [name] on 06/06/19 at  9:40 AM EDT by telephone and verified that I am speaking with the correct person using two identifiers.   I discussed the limitations, risks, security and privacy concerns of performing an evaluation and management service by telephone and the availability of in person appointments. I also discussed with the patient that there may be a patient responsible charge related to this service. The patient expressed understanding and agreed to proceed.     I discussed the assessment and treatment plan with the patient. The patient was provided an opportunity to ask questions and all were answered. The patient agreed with the plan and demonstrated an understanding of the instructions.   The patient was advised to call back or seek an in-person evaluation if the symptoms worsen or if the condition fails to improve as anticipated.  I provided 15 minutes of non-face-to-face time during this encounter.   Diane Ruder, MD  [facility] MD/PA/NP OP Progress Note  06/06/2019 10:09 AM Diane [name]  MRN:  [id number]  Chief Complaint:  Chief Complaint    Depression; Anxiety; Follow-up     HPI: This patient is a 68 year old black female who lives alone in [place].  She is on disability.  She returns for follow-up today for her bipolar disorder.  The patient is seen after 3 months and is assessed via telephone due to the coronavirus pandemic.  She states that she is not able to sleep.  She goes to bed around 9 PM and takes the Ambien and Xanax but still wakes up at midnight and is awake until 7 AM.  She is having hip pain and is going to be assessed for this.  She also has a lot of worries.  She and her boyfriend do not see each other much anymore as he has a new job and her family is against him.  She and her family are arguing and are estranged.  She and her daughter are not getting along and the daughter has made accusations about things  that the patient did many many years ago.  The patient has already tried Ambien and temazepam.  She claims she tried trazodone in the past but I suggested we go up to a higher dosage of it to see if it will help her sleep.  I also suggested cutting out caffeine making sure she gets outdoors and gets fresh air and exercise every day.  She claims she is barely drinking anymore.  She denies any thoughts of self-harm or suicidal ideation. 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 Bipolar I disorder, most recent episode depressed (HCC)  F31.30 divalproex (DEPAKOTE) 250 MG DR tablet    Past Psychiatric History: The patient has a history of one psychiatric admission in [facility] the [facility].  At that time she was using drugs and alcohol.  She has had outpatient treatment ever since  Past Medical History:  Past Medical History:  Diagnosis Date  . Allergic rhinitis   . Anxiety   . Arthritis   . Cataracts, bilateral   . Chronic back pain    Sees pain mamagement clinic  . Chronic pain syndrome   . Constipation   . Depression   . GERD (gastroesophageal reflux disease)   . Headaches, cluster 3 yrs ago   last one  . High cholesterol   .  Hypertension   . Hypothyroid   . Immature cataract   . Limp    right leg  . Mania ([place])   . PTSD (post-traumatic stress disorder)   . Right lumbar radiculopathy   . Seizures ([place]) 20 yrs ago   unknown etiology-on meds  . Sleep apnea    STOP BANG score=4  . 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[facility])     Past Surgical History:  Procedure Laterality Date  . BACK SURGERY    . CATARACT EXTRACTION W/PHACO Left 01/04/2016   Procedure: CATARACT EXTRACTION PHACO AND INTRAOCULAR LENS PLACEMENT (IOC);  Surgeon: Rutherford Guys, MD;  Location: [facility];  Service: Ophthalmology;  Laterality: Left;  CDE:4.70  . CATARACT EXTRACTION W/PHACO Right 01/18/2016   Procedure: CATARACT EXTRACTION PHACO  AND INTRAOCULAR LENS PLACEMENT RIGHT EYE; CDE:  4.66;  Surgeon: Rutherford Guys, MD;  Location: [facility];  Service: Ophthalmology;  Laterality: Right;  . CHOLECYSTECTOMY    . COLONOSCOPY  10/16/2008   SWN:[id number], otherwise normal rectum; pancolonic diverticula the remainder of the colonic mucosa appeared normal. Next TCS due 10/2013.  Marland Kitchen COLONOSCOPY  06/28/2006   RMR: Internal hemorrhoids. Diminutive rectal polyps, cold biopsied/ Pancolonic diverticula. Polyps in the right colon removed   . COLONOSCOPY WITH PROPOFOL N/A 05/20/2015   RMR: Pancolonic diverticulosis. Redundant colon   . ESOPHAGOGASTRODUODENOSCOPY  10/16/2008   GHW:EXHBZ hiatal hernia otherwise normal esophagus, stomach  D1, D2, status post passage of a 56-French Maloney dilator  . ESOPHAGOGASTRODUODENOSCOPY  06/28/2006   JIR:CVELFY esophagus. Small hiatal hernia. Otherwise normal stomach, D1 and D2, status post passage of a 56 [place] Maloney dilator  . ESOPHAGOGASTRODUODENOSCOPY (EGD) WITH ESOPHAGEAL DILATION N/A 07/14/2013   RMR: Abnormal esophagus of uncertain significance-status post esophageal biospy. small hiatal hernia.   Marland Kitchen ESOPHAGOGASTRODUODENOSCOPY (EGD) WITH PROPOFOL N/A 05/20/2015   RMR: NORMAL EGD status post maloney dilation   . ESOPHAGOGASTRODUODENOSCOPY (EGD) WITH PROPOFOL N/A 12/23/2018   Dr. Gala Romney: normal esophagus, s/p esophageal dilation.   Marland Kitchen FOOT SURGERY     left foot-  . INCISION AND DRAINAGE Right 01/03/2013   Procedure: INCISION AND DRAINAGE;  Surgeon: Carole Civil, MD;  Location: [facility];  Service: Orthopedics;  Laterality: Right;  . KNEE ARTHROSCOPY  01/2012   right  . KNEE ARTHROSCOPY WITH LATERAL RELEASE Right 01/03/2013   Procedure: Lateral Release Patella Right Knee;  Surgeon: Carole Civil, MD;  Location: [facility];  Service: Orthopedics;  Laterality: Right;  . left foot  x 2  . LUMBAR DISC SURGERY  L4-5  . MALONEY DILATION N/A 05/20/2015   Procedure: Venia Minks DILATION;  Surgeon: Daneil Dolin, MD;   Location: [facility];  Service: Endoscopy;  Laterality: N/A;  Maloney dilator # 57  . MALONEY DILATION N/A 12/23/2018   Procedure: Venia Minks DILATION;  Surgeon: Daneil Dolin, MD;  Location: [facility];  Service: Endoscopy;  Laterality: N/A;  . oophoectomy  bilateral  . PARTIAL HYSTERECTOMY    . TOTAL KNEE ARTHROPLASTY  07/01/2012   Procedure: TOTAL KNEE ARTHROPLASTY;  Surgeon: Carole Civil, MD;  Location: [facility];  Service: Orthopedics;  Laterality: Right;  . TOTAL KNEE REVISION Right 01/03/2013   Procedure: Patellaplasty Right Knee;  Surgeon: Carole Civil, MD;  Location: [facility];  Service: Orthopedics;  Laterality: Right;    Family Psychiatric History: See below  Family History:  Family History  Problem Relation Age of Onset  . Alcohol abuse Mother   . Anxiety disorder Mother   . Alcohol abuse Father   .  Anxiety disorder Father   . Bipolar disorder Sister   . Alcohol abuse Brother   . Anxiety disorder Brother   . Drug abuse Brother   . Dementia Paternal Aunt   . ADD / ADHD Grandchild   . ADD / ADHD Grandchild   . Drug abuse Brother   . Alcohol abuse Brother   . Anxiety disorder Brother   . Seizures Brother   . Alcohol abuse Brother   . Anxiety disorder Brother   . Seizures Brother   . Alcohol abuse Brother   . Anxiety disorder Brother   . Alcohol abuse Brother   . Anxiety disorder Brother   . Heart disease Other   . Diabetes Other   . Alcohol abuse Other   . Depression Neg Hx   . OCD Neg Hx   . Paranoid behavior Neg Hx   . Schizophrenia Neg Hx   . Sexual abuse Neg Hx   . Physical abuse Neg Hx   . Colon cancer Neg Hx     Social History:  Social History   Socioeconomic History  . Marital status: Legally Separated    Spouse name: Not on file  . Number of children: Not on file  . Years of education: 12th grade  . Highest education level: Not on file  Occupational History  . Occupation: disabled    Associate Professor: UNEMPLOYED  Social Needs  . Financial  resource strain: Not on file  . Food insecurity    Worry: Not on file    Inability: Not on file  . Transportation needs    Medical: Not on file    Non-medical: Not on file  Tobacco Use  . Smoking status: Former Smoker    Packs/day: 0.25    Years: 5.00    Pack years: 1.25    Types: Cigarettes    Quit date: 06/26/2003    Years since quitting: 15.9  . Smokeless tobacco: Never Used  Substance and Sexual Activity  . Alcohol use: Yes    Alcohol/week: 0.0 standard drinks    Comment: occasionally; once a month.  . Drug use: No    Comment: has a past history of street drug use  . Sexual activity: Never    Birth control/protection: Surgical  Lifestyle  . Physical activity    Days per week: Not on file    Minutes per session: Not on file  . Stress: Not on file  Relationships  . Social Musician on phone: Not on file    Gets together: Not on file    Attends religious service: Not on file    Active member of club or organization: Not on file    Attends meetings of clubs or organizations: Not on file    Relationship status: Not on file  Other Topics Concern  . Not on file  Social History Narrative  . Not on file    Allergies:  pcn Metabolic Disorder Labs: Lab Results  Component Value Date   HGBA1C 5.8 (H) 01/25/2012   MPG 120 (H) 01/25/2012   No results found for: PROLACTIN Lab Results  Component Value Date   CHOL  12/10/2010    69        ATP III CLASSIFICATION:  <200     mg/dL   Desirable  161-096  mg/dL   Borderline High  >=045    mg/dL   High          TRIG 38 12/10/2010   HDL 28 (  L) 12/10/2010   CHOLHDL 2.5 12/10/2010   VLDL 8 12/10/2010   LDLCALC  12/10/2010    33        Total Cholesterol/HDL:CHD Risk Coronary Heart Disease Risk Table                     Men   Women  1/2 Average Risk   3.4   3.3  Average Risk       5.0   4.4  2 X Average Risk   9.6   7.1  3 X Average Risk  23.4   11.0        Use the calculated Patient Ratio above and the  CHD Risk Table to determine the patient's CHD Risk.        ATP III CLASSIFICATION (LDL):  <100     mg/dL   Optimal  382-505  mg/dL   Near or Above                    Optimal  130-159  mg/dL   Borderline  397-673  mg/dL   High  >419     mg/dL   Very High   LDLCALC 71 07/12/2009   Lab Results  Component Value Date   TSH 0.52 02/11/2018   TSH <0.008 (L) 12/09/2010    Therapeutic Level Labs: No results found for: LITHIUM Lab Results  Component Value Date   VALPROATE 70.7 09/24/2018   VALPROATE 71.5 01/30/2017   No components found for:  CBMZ  Current Medications: Current Outpatient Medications  Medication Sig Dispense Refill  . ALPRAZolam (XANAX) 0.5 MG tablet Take one twice a day and two at bedtime 120 tablet 2  . amLODipine (NORVASC) 5 MG tablet Take 5 mg by mouth daily.    Marland Kitchen atorvastatin (LIPITOR) 20 MG tablet Take 20 mg by mouth daily.    . cyclobenzaprine (FLEXERIL) 10 MG tablet TAKE ONE TABLET BY MOUTH THREE TIMES DAILY AS NEEDED FOR MUSCLE SPASMS. (Patient taking differently: Take 10 mg by mouth 3 (three) times daily as needed for muscle spasms. ) 90 tablet 5  . divalproex (DEPAKOTE) 250 MG DR tablet TAKE THREE (3) TABLETS BY MOUTH AT BEDTIME. 90 tablet 2  . FLUoxetine (PROZAC) 20 MG capsule Take 1 capsule (20 mg total) by mouth daily. 30 capsule 2  . hydrocortisone (ANUSOL-HC) 2.5 % rectal cream Place 1 application rectally 2 (two) times daily. (Patient taking differently: Place 1 application rectally daily as needed for hemorrhoids. ) 30 g 1  . levothyroxine (SYNTHROID, LEVOTHROID) 150 MCG tablet Take 150 mcg by mouth daily before breakfast.   5  . lisinopril (PRINIVIL,ZESTRIL) 10 MG tablet Take 10 mg by mouth daily.    Marland Kitchen lubiprostone (AMITIZA) 24 MCG capsule Take 1 capsule (24 mcg total) by mouth 2 (two) times daily with a meal. 60 capsule 5  . meloxicam (MOBIC) 7.5 MG tablet TAKE ONE TABLET BY MOUTH DAILY 30 tablet 5  . Naldemedine Tosylate (SYMPROIC) 0.2 MG TABS Take  0.2 mg by mouth daily. For constipation on days you take your pain medication. 30 tablet 3  . oxyCODONE-acetaminophen (PERCOCET) 7.5-325 MG tablet Take 1 tablet by mouth every 4 (four) hours as needed for up to 7 days for severe pain. 42 tablet 0  . pantoprazole (PROTONIX) 40 MG tablet Take 1 tablet (40 mg total) by mouth 2 (two) times daily. 60 tablet 5  . traZODone (DESYREL) 150 MG tablet Take 1 tablet (150 mg total)  by mouth at bedtime. 30 tablet 2   No current facility-administered medications for this visit.      Musculoskeletal: Strength & Muscle Tone: within normal limits Gait & Station: normal Patient leans: N/A  Psychiatric Specialty Exam: Review of Systems  Musculoskeletal: Positive for back pain and joint pain.  Psychiatric/Behavioral: The patient has insomnia.   All other systems reviewed and are negative.   There were no vitals taken for this visit.There is no height or weight on file to calculate BMI.  General Appearance: NA  Eye Contact:  NA  Speech:  Clear and Coherent  Volume:  Normal  Mood:  Anxious  Affect:  NA  Thought Process:  Goal Directed  Orientation:  Full (Time, Place, and Person)  Thought Content: Rumination   Suicidal Thoughts:  No  Homicidal Thoughts:  No  Memory:  Immediate;   Good Recent;   Good Remote;   Good  Judgement:  Fair  Insight:  Shallow  Psychomotor Activity:  Decreased  Concentration:  Concentration: Good and Attention Span: Good  Recall:  Fair  Fund of Knowledge: Fair  Language: Good  Akathisia:  No  Handed:  Right  AIMS (if indicated): not done  Assets:  Communication Skills Desire for Improvement Resilience Social Support  ADL's:  Intact  Cognition: WNL  Sleep:  Poor   Screenings:   Assessment and Plan: This patient is a 68 year old female with a history of depression and anxiety.  She continues to have stress due to conflict with family members.  I think this is the main reason she is not sleeping well.  We will  discontinue Ambien and try trazodone 150 mg nightly.  She will continue Prozac 20 mg daily for depression, Xanax 0.5 mg daily and 1 mg at bedtime for anxiety and Depakote 750 mg at bedtime for mood stabilization.  She will call me next week if the trazodone is not helping otherwise she will return to see me in 4 weeks   Diane Rudereborah Naliya Gish, MD 06/06/2019, 10:09 AM

## 2019-06-09 ENCOUNTER — Other Ambulatory Visit: Payer: Self-pay | Admitting: Orthopedic Surgery

## 2019-06-09 DIAGNOSIS — G894 Chronic pain syndrome: Secondary | ICD-10-CM

## 2019-06-09 NOTE — Telephone Encounter (Signed)
Patient requests refill on Oxycodone/Acetaminophen 7.5-325 mgs.  Qty  42       Sig: Take 1 tablet by mouth every 4 (four) hours as needed for up to 7 days for severe pain.         Patient states she uses [facility] in [place]

## 2019-06-10 DIAGNOSIS — M7062 Trochanteric bursitis, left hip: Secondary | ICD-10-CM | POA: Diagnosis not present

## 2019-06-10 DIAGNOSIS — M25552 Pain in left hip: Secondary | ICD-10-CM | POA: Diagnosis not present

## 2019-06-10 MED ORDER — OXYCODONE-ACETAMINOPHEN 7.5-325 MG PO TABS: 1.0000 | ORAL_TABLET | ORAL | 0 refills | Status: DC | PRN

## 2019-06-11 ENCOUNTER — Other Ambulatory Visit: Payer: Self-pay | Admitting: Gastroenterology

## 2019-06-13 ENCOUNTER — Other Ambulatory Visit: Payer: Self-pay | Admitting: Orthopedic Surgery

## 2019-06-13 DIAGNOSIS — G894 Chronic pain syndrome: Secondary | ICD-10-CM

## 2019-06-13 NOTE — Telephone Encounter (Signed)
Patient stated that she called and  [name] said they do not have a refill request from our office for her.  I then called myself and was told the same thing.  Could you check on this patient's refill request for her please?  Thanks

## 2019-06-16 ENCOUNTER — Other Ambulatory Visit: Payer: Self-pay | Admitting: Radiology

## 2019-06-16 DIAGNOSIS — G894 Chronic pain syndrome: Secondary | ICD-10-CM

## 2019-06-16 MED ORDER — OXYCODONE-ACETAMINOPHEN 7.5-325 MG PO TABS: 1.0000 | ORAL_TABLET | ORAL | 0 refills | Status: DC | PRN

## 2019-06-20 ENCOUNTER — Other Ambulatory Visit: Payer: Self-pay | Admitting: Orthopedic Surgery

## 2019-06-20 DIAGNOSIS — G894 Chronic pain syndrome: Secondary | ICD-10-CM

## 2019-06-20 NOTE — Telephone Encounter (Signed)
Patient aware it will be Monday for medication refill reviews: Requests: oxyCODONE-acetaminophen (PERCOCET) 7.5-325 MG tablet 42 tablet  -[name]

## 2019-06-23 MED ORDER — OXYCODONE-ACETAMINOPHEN 7.5-325 MG PO TABS: 1.0000 | ORAL_TABLET | ORAL | 0 refills | Status: DC | PRN

## 2019-06-24 DIAGNOSIS — M545 Low back pain: Secondary | ICD-10-CM | POA: Diagnosis not present

## 2019-06-24 DIAGNOSIS — R296 Repeated falls: Secondary | ICD-10-CM | POA: Diagnosis not present

## 2019-06-24 DIAGNOSIS — R269 Unspecified abnormalities of gait and mobility: Secondary | ICD-10-CM | POA: Diagnosis not present

## 2019-06-24 DIAGNOSIS — M25552 Pain in left hip: Secondary | ICD-10-CM | POA: Diagnosis not present

## 2019-06-24 DIAGNOSIS — M5417 Radiculopathy, lumbosacral region: Secondary | ICD-10-CM | POA: Diagnosis not present

## 2019-06-27 ENCOUNTER — Other Ambulatory Visit: Payer: Self-pay

## 2019-06-27 DIAGNOSIS — G894 Chronic pain syndrome: Secondary | ICD-10-CM

## 2019-06-27 NOTE — Telephone Encounter (Signed)
Oxycodone-Acetaminophen 7.5/325 mg  Qty  42 Tablets  Take 1 tablet by mouth every 4(four) hours as needed for up to 7 days for severe pain.  PATIENT USES [name] IN [place]

## 2019-06-30 MED ORDER — OXYCODONE-ACETAMINOPHEN 7.5-325 MG PO TABS: 1.0000 | ORAL_TABLET | ORAL | 0 refills | Status: DC | PRN

## 2019-07-01 DIAGNOSIS — R296 Repeated falls: Secondary | ICD-10-CM | POA: Diagnosis not present

## 2019-07-01 DIAGNOSIS — R269 Unspecified abnormalities of gait and mobility: Secondary | ICD-10-CM | POA: Diagnosis not present

## 2019-07-01 DIAGNOSIS — M5417 Radiculopathy, lumbosacral region: Secondary | ICD-10-CM | POA: Diagnosis not present

## 2019-07-01 DIAGNOSIS — M25552 Pain in left hip: Secondary | ICD-10-CM | POA: Diagnosis not present

## 2019-07-01 DIAGNOSIS — M545 Low back pain: Secondary | ICD-10-CM | POA: Diagnosis not present

## 2019-07-03 DIAGNOSIS — M25552 Pain in left hip: Secondary | ICD-10-CM | POA: Diagnosis not present

## 2019-07-03 DIAGNOSIS — M5417 Radiculopathy, lumbosacral region: Secondary | ICD-10-CM | POA: Diagnosis not present

## 2019-07-03 DIAGNOSIS — R269 Unspecified abnormalities of gait and mobility: Secondary | ICD-10-CM | POA: Diagnosis not present

## 2019-07-03 DIAGNOSIS — R296 Repeated falls: Secondary | ICD-10-CM | POA: Diagnosis not present

## 2019-07-03 DIAGNOSIS — M545 Low back pain: Secondary | ICD-10-CM | POA: Diagnosis not present

## 2019-07-07 ENCOUNTER — Other Ambulatory Visit: Payer: Self-pay

## 2019-07-07 ENCOUNTER — Ambulatory Visit (HOSPITAL_COMMUNITY): Payer: Medicare Other | Admitting: Psychiatry

## 2019-07-07 DIAGNOSIS — G894 Chronic pain syndrome: Secondary | ICD-10-CM

## 2019-07-07 MED ORDER — OXYCODONE-ACETAMINOPHEN 7.5-325 MG PO TABS: 1.0000 | ORAL_TABLET | ORAL | 0 refills | Status: DC | PRN

## 2019-07-07 NOTE — Telephone Encounter (Signed)
Oxycodone-Acetaminophen 7.5/325 MG  Qty 42 Tablets Take 1 tablet by mouth every 4 (four) hours as needed for up to 7 days for severe pain.  PATIENT USES [name] IN [place]

## 2019-07-08 DIAGNOSIS — M545 Low back pain: Secondary | ICD-10-CM | POA: Diagnosis not present

## 2019-07-08 DIAGNOSIS — R269 Unspecified abnormalities of gait and mobility: Secondary | ICD-10-CM | POA: Diagnosis not present

## 2019-07-08 DIAGNOSIS — M5417 Radiculopathy, lumbosacral region: Secondary | ICD-10-CM | POA: Diagnosis not present

## 2019-07-08 DIAGNOSIS — R296 Repeated falls: Secondary | ICD-10-CM | POA: Diagnosis not present

## 2019-07-08 DIAGNOSIS — M25552 Pain in left hip: Secondary | ICD-10-CM | POA: Diagnosis not present

## 2019-07-09 ENCOUNTER — Other Ambulatory Visit: Payer: Self-pay

## 2019-07-09 NOTE — Patient Outreach (Signed)
[facility]) Care Management  07/09/2019  Diane [name] November 08, 1950 [id number]   Medication Adherence call to Mrs. Diane [name] Identifiers Verify spoke with patient she is past due on Lisinopril 10 mg,patient explain she is taking this medication but, patient said I am going to be honest sometimes I do not take it, som times I forget to take it,patient has pick up this medication from the pharmacy, she ask why she was only receiving 20 tablets per [facility] pharmacy they said she is on a synchronize program and patient has not pick up and will give the patient a call. [name] is showing past due under [place].   [facility] Management Direct Dial [phone]  Fax [phone] [email or web address]

## 2019-07-11 ENCOUNTER — Other Ambulatory Visit: Payer: Self-pay | Admitting: Orthopedic Surgery

## 2019-07-11 DIAGNOSIS — G894 Chronic pain syndrome: Secondary | ICD-10-CM

## 2019-07-11 NOTE — Telephone Encounter (Signed)
Patient requests refill:  oxyCODONE-acetaminophen (PERCOCET) 7.5-325 MG tablet 42 tablet  -[name], [place]    - patient aware request will be reviewed on next clinic day, Monday, 07/14/19, and aware of November appointment .

## 2019-07-14 MED ORDER — OXYCODONE-ACETAMINOPHEN 7.5-325 MG PO TABS: 1.0000 | ORAL_TABLET | ORAL | 0 refills | Status: DC | PRN

## 2019-07-15 DIAGNOSIS — M25552 Pain in left hip: Secondary | ICD-10-CM | POA: Diagnosis not present

## 2019-07-15 DIAGNOSIS — M545 Low back pain: Secondary | ICD-10-CM | POA: Diagnosis not present

## 2019-07-15 DIAGNOSIS — M5417 Radiculopathy, lumbosacral region: Secondary | ICD-10-CM | POA: Diagnosis not present

## 2019-07-15 DIAGNOSIS — R296 Repeated falls: Secondary | ICD-10-CM | POA: Diagnosis not present

## 2019-07-15 DIAGNOSIS — R269 Unspecified abnormalities of gait and mobility: Secondary | ICD-10-CM | POA: Diagnosis not present

## 2019-07-18 ENCOUNTER — Other Ambulatory Visit: Payer: Self-pay | Admitting: Orthopedic Surgery

## 2019-07-18 DIAGNOSIS — G894 Chronic pain syndrome: Secondary | ICD-10-CM

## 2019-07-18 NOTE — Telephone Encounter (Signed)
Patient called for refill:  oxyCODONE-acetaminophen (PERCOCET) 7.5-325 MG tablet 42 tablet  -[place]

## 2019-07-21 DIAGNOSIS — M7062 Trochanteric bursitis, left hip: Secondary | ICD-10-CM | POA: Diagnosis not present

## 2019-07-21 MED ORDER — OXYCODONE-ACETAMINOPHEN 7.5-325 MG PO TABS: 1.0000 | ORAL_TABLET | ORAL | 0 refills | Status: DC | PRN

## 2019-07-23 ENCOUNTER — Ambulatory Visit: Payer: Medicare Other | Admitting: Orthopedic Surgery

## 2019-07-25 DIAGNOSIS — I10 Essential (primary) hypertension: Secondary | ICD-10-CM | POA: Diagnosis not present

## 2019-07-25 DIAGNOSIS — M7062 Trochanteric bursitis, left hip: Secondary | ICD-10-CM | POA: Diagnosis not present

## 2019-07-25 DIAGNOSIS — G8929 Other chronic pain: Secondary | ICD-10-CM | POA: Diagnosis not present

## 2019-07-25 DIAGNOSIS — E039 Hypothyroidism, unspecified: Secondary | ICD-10-CM | POA: Diagnosis not present

## 2019-07-25 DIAGNOSIS — M179 Osteoarthritis of knee, unspecified: Secondary | ICD-10-CM | POA: Diagnosis not present

## 2019-07-25 DIAGNOSIS — R6889 Other general symptoms and signs: Secondary | ICD-10-CM | POA: Diagnosis not present

## 2019-07-28 ENCOUNTER — Ambulatory Visit (INDEPENDENT_AMBULATORY_CARE_PROVIDER_SITE_OTHER): Payer: Medicare Other | Admitting: Orthopedic Surgery

## 2019-07-28 ENCOUNTER — Ambulatory Visit: Payer: Medicare Other

## 2019-07-28 ENCOUNTER — Encounter: Payer: Self-pay | Admitting: Orthopedic Surgery

## 2019-07-28 ENCOUNTER — Other Ambulatory Visit: Payer: Self-pay

## 2019-07-28 VITALS — BP 143/69 | HR 67 | Ht 63.0 in | Wt 221.0 lb

## 2019-07-28 DIAGNOSIS — M1711 Unilateral primary osteoarthritis, right knee: Secondary | ICD-10-CM

## 2019-07-28 DIAGNOSIS — G894 Chronic pain syndrome: Secondary | ICD-10-CM

## 2019-07-28 MED ORDER — OXYCODONE-ACETAMINOPHEN 7.5-325 MG PO TABS: 1.0000 | ORAL_TABLET | ORAL | 0 refills | Status: DC | PRN

## 2019-07-28 MED ORDER — CYCLOBENZAPRINE HCL 10 MG PO TABS: 10.0000 mg | ORAL_TABLET | Freq: Two times a day (BID) | ORAL | 1 refills | Status: DC | PRN

## 2019-07-28 NOTE — Progress Notes (Signed)
ANNUAL FOLLOW UP FOR right TKA   Chief Complaint  Patient presents with  . Routine Post Op    07/01/12 right total knee     HPI: The patient is here for the annual  follow-up x-ray for knee replacement. The patient is not complaining of pain weakness instability or stiffness in the repaired knee.   Review of Systems  Musculoskeletal:       Left hip and left knee pain       Examination of the right KNEE  BP (!) 143/69   Pulse 67   Ht 5\' 3"  (1.6 m)   Wt 221 lb (100.2 kg)   BMI 39.15 kg/m   General the patient is normally groomed in no distress  Inspection shows : incision healed nicely without erythema, no tenderness no swelling  Range of motion total range of motion is 100 degrees  Stability the knee is stable anterior to posterior as well as medial to lateral  Strength quadriceps strength is normal  Skin no erythema around the skin incision  Neuro: normal sensation in the operative leg   Gait: normal expected gait without cane    Medical decision-making section  X-rays ordered, internal imaging shows (see full dictated report) stable implant with no signs of loosening  Diagnosis  Encounter Diagnoses  Name Primary?  Marland Kitchen Arthritis of knee, right Yes  . Chronic pain syndrome      Plan follow-up 1 year repeat x-rays

## 2019-07-29 ENCOUNTER — Ambulatory Visit (INDEPENDENT_AMBULATORY_CARE_PROVIDER_SITE_OTHER): Payer: Medicare Other | Admitting: Psychiatry

## 2019-07-29 ENCOUNTER — Encounter (HOSPITAL_COMMUNITY): Payer: Self-pay | Admitting: Psychiatry

## 2019-07-29 DIAGNOSIS — F5105 Insomnia due to other mental disorder: Secondary | ICD-10-CM

## 2019-07-29 DIAGNOSIS — F3130 Bipolar disorder, current episode depressed, mild or moderate severity, unspecified: Secondary | ICD-10-CM | POA: Diagnosis not present

## 2019-07-29 MED ORDER — DIVALPROEX SODIUM 250 MG PO DR TAB: DELAYED_RELEASE_TABLET | ORAL | 2 refills | Status: DC

## 2019-07-29 MED ORDER — TRAZODONE HCL 100 MG PO TABS: 200.0000 mg | ORAL_TABLET | Freq: Every day | ORAL | 2 refills | Status: DC

## 2019-07-29 MED ORDER — ALPRAZOLAM 0.5 MG PO TABS: ORAL_TABLET | ORAL | 2 refills | Status: DC

## 2019-07-29 MED ORDER — FLUOXETINE HCL 20 MG PO CAPS: 20.0000 mg | ORAL_CAPSULE | Freq: Two times a day (BID) | ORAL | 2 refills | Status: DC

## 2019-07-29 NOTE — Progress Notes (Signed)
Virtual Visit via Telephone Note  I connected with Diane [name] on 07/29/19 at  8:40 AM EST by telephone and verified that I am speaking with the correct person using two identifiers.   I discussed the limitations, risks, security and privacy concerns of performing an evaluation and management service by telephone and the availability of in person appointments. I also discussed with the patient that there may be a patient responsible charge related to this service. The patient expressed understanding and agreed to proceed.   I discussed the assessment and treatment plan with the patient. The patient was provided an opportunity to ask questions and all were answered. The patient agreed with the plan and demonstrated an understanding of the instructions.   The patient was advised to call back or seek an in-person evaluation if the symptoms worsen or if the condition fails to improve as anticipated.  I provided 15 minutes of non-face-to-face time during this encounter.   [name], MD  [facility] MD/PA/NP OP Progress Note  07/29/2019 9:12 AM Diane [name] Diane [name]  MRN:  [id number]  Chief Complaint:  Chief Complaint    Depression; Anxiety; Follow-up     HPI: This patient is a 68 year old black female who lives alone in [place].  She is on disability.  She returns for follow-up today for her bipolar disorder.  The patient returns after 3 months and assessed via telephone.  She states that she is doing a little bit better.  She has been getting out more with her niece.  She still has chronic bursitis as well as knee pain and these things tend to keep her up at night.  She is still not sleeping that well some nights.  I had increased the trazodone but explained we could increase it a little bit more.  I am reluctant to give her hypnotic medication because she takes both pain medicine and Xanax.  She states she is a bit more depressed.  Her boyfriend moved out several months ago because the patient's niece and  daughter got upset with him and thought he was having an affair with someone else.  She states that they are still "on and off."  She is alone a fair amount of the time and gets lonely.  She denies suicidal ideation but does feel sad a lot.  I suggested we go up on the Prozac and urged her to be more social with people at least over the phone.  She still maintains that she has cut "way back" on her alcohol use Visit Diagnosis:    ICD-10-CM   1. Bipolar I disorder, most recent episode depressed (HCC)  F31.30 divalproex (DEPAKOTE) 250 MG DR tablet  2. Insomnia due to mental disorder  F51.05 ALPRAZolam (XANAX) 0.5 MG tablet    Past Psychiatric History: The patient has a history of one [facility] in [facility].  At that time she was using drugs and alcohol.  She has had outpatient treatment ever since  Past Medical History:  Past Medical History:  Diagnosis Date  . Allergic rhinitis   . Anxiety   . Arthritis   . Cataracts, bilateral   . Chronic back pain    Sees pain mamagement clinic  . Chronic pain syndrome   . Constipation   . Depression   . GERD (gastroesophageal reflux disease)   . Headaches, cluster 3 yrs ago   last one  . High cholesterol   . Hypertension   . Hypothyroid   .  Immature cataract   . Limp    right leg  . Mania ([place])   . PTSD (post-traumatic stress disorder)   . Right lumbar radiculopathy   . Seizures ([place]) 20 yrs ago   unknown etiology-on meds  . Sleep apnea    STOP BANG score=4  . 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[facility])     Past Surgical History:  Procedure Laterality Date  . BACK SURGERY    . CATARACT EXTRACTION W/PHACO Left 01/04/2016   Procedure: CATARACT EXTRACTION PHACO AND INTRAOCULAR LENS PLACEMENT (IOC);  Surgeon: Rutherford Guys, MD;  Location: [facility];  Service: Ophthalmology;  Laterality: Left;  CDE:4.70  . CATARACT EXTRACTION W/PHACO Right 01/18/2016   Procedure: CATARACT EXTRACTION PHACO AND INTRAOCULAR LENS  PLACEMENT RIGHT EYE; CDE:  4.66;  Surgeon: Rutherford Guys, MD;  Location: [facility];  Service: Ophthalmology;  Laterality: Right;  . CHOLECYSTECTOMY    . COLONOSCOPY  10/16/2008   ZWC:HENIDPOEUMP, otherwise normal rectum; pancolonic diverticula the remainder of the colonic mucosa appeared normal. Next TCS due 10/2013.  Marland Kitchen COLONOSCOPY  06/28/2006   RMR: Internal hemorrhoids. Diminutive rectal polyps, cold biopsied/ Pancolonic diverticula. Polyps in the right colon removed   . COLONOSCOPY WITH PROPOFOL N/A 05/20/2015   RMR: Pancolonic diverticulosis. Redundant colon   . ESOPHAGOGASTRODUODENOSCOPY  10/16/2008   NTI:RWERX hiatal hernia otherwise normal esophagus, stomach  D1, D2, status post passage of a 56-French Maloney dilator  . ESOPHAGOGASTRODUODENOSCOPY  06/28/2006   VQM:GQQPYP esophagus. Small hiatal hernia. Otherwise normal stomach, D1 and D2, status post passage of a 56 [place] Maloney dilator  . ESOPHAGOGASTRODUODENOSCOPY (EGD) WITH ESOPHAGEAL DILATION N/A 07/14/2013   RMR: Abnormal esophagus of uncertain significance-status post esophageal biospy. small hiatal hernia.   Marland Kitchen ESOPHAGOGASTRODUODENOSCOPY (EGD) WITH PROPOFOL N/A 05/20/2015   RMR: NORMAL EGD status post maloney dilation   . ESOPHAGOGASTRODUODENOSCOPY (EGD) WITH PROPOFOL N/A 12/23/2018   Dr. Gala Romney: normal esophagus, s/p esophageal dilation.   Marland Kitchen FOOT SURGERY     left foot-  . INCISION AND DRAINAGE Right 01/03/2013   Procedure: INCISION AND DRAINAGE;  Surgeon: Carole Civil, MD;  Location: [facility];  Service: Orthopedics;  Laterality: Right;  . KNEE ARTHROSCOPY  01/2012   right  . KNEE ARTHROSCOPY WITH LATERAL RELEASE Right 01/03/2013   Procedure: Lateral Release Patella Right Knee;  Surgeon: Carole Civil, MD;  Location: [facility];  Service: Orthopedics;  Laterality: Right;  . left foot  x 2  . LUMBAR DISC SURGERY  L4-5  . MALONEY DILATION N/A 05/20/2015   Procedure: Venia Minks DILATION;  Surgeon: Daneil Dolin, MD;  Location: [facility];   Service: Endoscopy;  Laterality: N/A;  Maloney dilator # 9  . MALONEY DILATION N/A 12/23/2018   Procedure: Venia Minks DILATION;  Surgeon: Daneil Dolin, MD;  Location: [facility] SU[facility];  Service: Endoscopy;  Laterality: N/A;  . oophoectomy  bilateral  . PARTIAL HYSTERECTOMY    . TOTAL KNEE ARTHROPLASTY  07/01/2012   Procedure: TOTAL KNEE ARTHROPLASTY;  Surgeon: Carole Civil, MD;  Location: [facility];  Service: Orthopedics;  Laterality: Right;  . TOTAL KNEE REVISION Right 01/03/2013   Procedure: Patellaplasty Right Knee;  Surgeon: Carole Civil, MD;  Location: [facility];  Service: Orthopedics;  Laterality: Right;    Family Psychiatric History: see below  Family History:  Family History  Problem Relation Age of Onset  . Alcohol abuse Mother   . Anxiety disorder Mother   . Alcohol abuse Father   . Anxiety disorder Father   .  Bipolar disorder Sister   . Alcohol abuse Brother   . Anxiety disorder Brother   . Drug abuse Brother   . Dementia Paternal Aunt   . ADD / ADHD Grandchild   . ADD / ADHD Grandchild   . Drug abuse Brother   . Alcohol abuse Brother   . Anxiety disorder Brother   . Seizures Brother   . Alcohol abuse Brother   . Anxiety disorder Brother   . Seizures Brother   . Alcohol abuse Brother   . Anxiety disorder Brother   . Alcohol abuse Brother   . Anxiety disorder Brother   . Heart disease Other   . Diabetes Other   . Alcohol abuse Other   . Depression Neg Hx   . OCD Neg Hx   . Paranoid behavior Neg Hx   . Schizophrenia Neg Hx   . Sexual abuse Neg Hx   . Physical abuse Neg Hx   . Colon cancer Neg Hx     Social History:  Social History   Socioeconomic History  . Marital status: Legally Separated    Spouse name: Not on file  . Number of children: Not on file  . Years of education: 12th grade  . Highest education level: Not on file  Occupational History  . Occupation: disabled    Associate Professor: UNEMPLOYED  Social Needs  . Financial resource strain: Not on  file  . Food insecurity    Worry: Not on file    Inability: Not on file  . Transportation needs    Medical: Not on file    Non-medical: Not on file  Tobacco Use  . Smoking status: Former Smoker    Packs/day: 0.25    Years: 5.00    Pack years: 1.25    Types: Cigarettes    Quit date: 06/26/2003    Years since quitting: 16.1  . Smokeless tobacco: Never Used  Substance and Sexual Activity  . Alcohol use: Yes    Alcohol/week: 0.0 standard drinks    Comment: occasionally; once a month.  . Drug use: No    Comment: has a past history of street drug use  . Sexual activity: Never    Birth control/protection: Surgical  Lifestyle  . Physical activity    Days per week: Not on file    Minutes per session: Not on file  . Stress: Not on file  Relationships  . Social Musician on phone: Not on file    Gets together: Not on file    Attends religious service: Not on file    Active member of club or organization: Not on file    Attends meetings of clubs or organizations: Not on file    Relationship status: Not on file  Other Topics Concern  . Not on file  Social History Narrative  . Not on file    Allergies:   Metabolic Disorder Labs: Lab Results  Component Value Date   HGBA1C 5.8 (H) 01/25/2012   MPG 120 (H) 01/25/2012   No results found for: PROLACTIN Lab Results  Component Value Date   CHOL  12/10/2010    69        ATP III CLASSIFICATION:  <200     mg/dL   Desirable  284-132  mg/dL   Borderline High  >=440    mg/dL   High          TRIG 38 12/10/2010   HDL 28 (L) 12/10/2010   CHOLHDL 2.5  12/10/2010   VLDL 8 12/10/2010   LDLCALC  12/10/2010    33        Total Cholesterol/HDL:CHD Risk Coronary Heart Disease Risk Table                     Men   Women  1/2 Average Risk   3.4   3.3  Average Risk       5.0   4.4  2 X Average Risk   9.6   7.1  3 X Average Risk  23.4   11.0        Use the calculated Patient Ratio above and the CHD Risk Table to determine  the patient's CHD Risk.        ATP III CLASSIFICATION (LDL):  <100     mg/dL   Optimal  161-096100-129  mg/dL   Near or Above                    Optimal  130-159  mg/dL   Borderline  045-409160-189  mg/dL   High  >811>190     mg/dL   Very High   LDLCALC 71 07/12/2009   Lab Results  Component Value Date   TSH 0.52 02/11/2018   TSH <0.008 (L) 12/09/2010    Therapeutic Level Labs: No results found for: LITHIUM Lab Results  Component Value Date   VALPROATE 70.7 09/24/2018   VALPROATE 71.5 01/30/2017   No components found for:  CBMZ  Current Medications: Current Outpatient Medications  Medication Sig Dispense Refill  . ALPRAZolam (XANAX) 0.5 MG tablet Take one twice a day and two at bedtime 120 tablet 2  . amLODipine (NORVASC) 5 MG tablet Take 5 mg by mouth daily.    Marland Kitchen. atorvastatin (LIPITOR) 20 MG tablet Take 20 mg by mouth daily.    . cyclobenzaprine (FLEXERIL) 10 MG tablet TAKE ONE TABLET BY MOUTH THREE TIMES DAILY AS NEEDED FOR MUSCLE SPASMS. (Patient taking differently: Take 10 mg by mouth 3 (three) times daily as needed for muscle spasms. ) 90 tablet 5  . cyclobenzaprine (FLEXERIL) 10 MG tablet Take 1 tablet (10 mg total) by mouth 2 (two) times daily as needed for muscle spasms. 40 tablet 1  . divalproex (DEPAKOTE) 250 MG DR tablet TAKE THREE (3) TABLETS BY MOUTH AT BEDTIME. 90 tablet 2  . FLUoxetine (PROZAC) 20 MG capsule Take 1 capsule (20 mg total) by mouth 2 (two) times daily. 60 capsule 2  . hydrocortisone (ANUSOL-HC) 2.5 % rectal cream Place 1 application rectally 2 (two) times daily. (Patient taking differently: Place 1 application rectally daily as needed for hemorrhoids. ) 30 g 1  . levothyroxine (SYNTHROID, LEVOTHROID) 150 MCG tablet Take 150 mcg by mouth daily before breakfast.   5  . lisinopril (PRINIVIL,ZESTRIL) 10 MG tablet Take 10 mg by mouth daily.    Marland Kitchen. lubiprostone (AMITIZA) 24 MCG capsule Take 1 capsule (24 mcg total) by mouth 2 (two) times daily with a meal. 60 capsule 5  .  meloxicam (MOBIC) 7.5 MG tablet TAKE ONE TABLET BY MOUTH DAILY 30 tablet 5  . oxyCODONE-acetaminophen (PERCOCET) 7.5-325 MG tablet Take 1 tablet by mouth every 4 (four) hours as needed for up to 7 days for severe pain. 42 tablet 0  . pantoprazole (PROTONIX) 40 MG tablet Take 1 tablet (40 mg total) by mouth 2 (two) times daily. 60 tablet 5  . SYMPROIC 0.2 MG TABS TAKE ONE TABLET BY MOUTH DAILY FOR CONSTIPATION ON  DAYS YOU TAKE YOUR PAIN MEDICATION. 30 tablet 3  . traZODone (DESYREL) 100 MG tablet Take 2 tablets (200 mg total) by mouth at bedtime. 30 tablet 2   No current facility-administered medications for this visit.      Musculoskeletal: Strength & Muscle Tone: decreased Gait & Station: unsteady Patient leans: N/A  Psychiatric Specialty Exam: Review of Systems  Musculoskeletal: Positive for back pain and joint pain.  Psychiatric/Behavioral: Positive for depression. The patient has insomnia.   All other systems reviewed and are negative.   There were no vitals taken for this visit.There is no height or weight on file to calculate BMI.  General Appearance: NA  Eye Contact:  NA  Speech:  Clear and Coherent  Volume:  Normal  Mood:  Dysphoric  Affect:  NA  Thought Process:  Goal Directed  Orientation:  Full (Time, Place, and Person)  Thought Content: Rumination   Suicidal Thoughts:  No  Homicidal Thoughts:  No  Memory:  Immediate;   Good Recent;   Good Remote;   Fair  Judgement:  Fair  Insight:  Fair  Psychomotor Activity:  Decreased  Concentration:  Concentration: Fair and Attention Span: Fair  Recall:  Good  Fund of Knowledge: Fair  Language: Good  Akathisia:  No  Handed:  Right  AIMS (if indicated): not done  Assets:  Communication Skills Desire for Improvement Resilience Social Support Talents/Skills  ADL's:  Intact  Cognition: WNL  Sleep:  Poor   Screenings:   Assessment and Plan: This patient is a 68 year old female with a history of depression anxiety  and chronic pain and a remote history of alcohol abuse.  She is still not sleeping that well so we will increase trazodone to 200 mg at bedtime.  Since she seems a bit more depressed we will increase Prozac to 20 mg twice daily.  She will continue Xanax 0.5 mg daily and 1 mg at bedtime for anxiety and Depakote 750 mg at bedtime for mood stabilization.  She will return to see me in 3 months   [name], MD 07/29/2019, 9:12 AM

## 2019-07-30 ENCOUNTER — Other Ambulatory Visit: Payer: Self-pay

## 2019-07-30 NOTE — Patient Outreach (Signed)
[facility]) Care Management  07/30/2019  Diane [name] [birth year] [id number]   Medication Adherence call to Diane [name] HIPPA Compliant Voice message left with a call back number. Diane [name] is showing past due on Lisinopril 10 mg under [place].   [employer] Management Direct Dial [phone]  Fax [phone] [name]

## 2019-08-04 ENCOUNTER — Other Ambulatory Visit: Payer: Self-pay

## 2019-08-04 DIAGNOSIS — G894 Chronic pain syndrome: Secondary | ICD-10-CM

## 2019-08-04 MED ORDER — OXYCODONE-ACETAMINOPHEN 7.5-325 MG PO TABS: 1.0000 | ORAL_TABLET | ORAL | 0 refills | Status: DC | PRN

## 2019-08-04 NOTE — Telephone Encounter (Signed)
Oxycodone-Acetaminophen  7.5/325mg   Qty 42 Tablets  Take 1 tablet by mouth every 4 (hours) as needed for up to 7 days for severe pain.  PATIENT USES [name] IN [place]

## 2019-08-05 ENCOUNTER — Other Ambulatory Visit: Payer: Self-pay | Admitting: Gastroenterology

## 2019-08-11 ENCOUNTER — Ambulatory Visit: Payer: Medicare Other | Admitting: Gastroenterology

## 2019-08-11 ENCOUNTER — Other Ambulatory Visit: Payer: Self-pay | Admitting: Orthopedic Surgery

## 2019-08-11 DIAGNOSIS — G894 Chronic pain syndrome: Secondary | ICD-10-CM

## 2019-08-11 NOTE — Progress Notes (Deleted)
Primary Care Physician: Redmond School, MD  Primary Gastroenterologist:  Garfield Cornea, MD   No chief complaint on file.   HPI: Diane [name] is a 68 y.o. female here for follow-up.  She had a virtual visit back in May.  History of abdominal cramping, constipation, GERD, colonic adenomas.  Her next colonoscopy is due in September 2021.  ED back in April, esophagus was normal but dilation performed due to history of dysphagia.  CT in February 2020 with 4 cm simple appearing left adnexal cyst, pelvic ultrasound recommended in 4 to 6 months.  Patient advised to follow-up with GYN.  We offered to make arrangements for ultrasound as she did not have a GYN.  Again wanting to postpone due to COVID-19 pandemic.  After last office visit she was instructed to continue pantoprazole for GERD.  Offered BPE to further evaluate persistent dysphagia status post esophageal dilation.  She wanted to postpone due to the COVID-19 pandemic.  For constipation she was advised to take Symproic every day.  Add back on Amitiza 24 twice daily.   Current Outpatient Medications  Medication Sig Dispense Refill  . ALPRAZolam (XANAX) 0.5 MG tablet Take one twice a day and two at bedtime 120 tablet 2  . AMITIZA 24 MCG capsule TAKE ONE CAPSULE BY MOUTH TWICE DAILY. TAKE WITH A MEAL. 60 capsule 5  . amLODipine (NORVASC) 5 MG tablet Take 5 mg by mouth daily.    Marland Kitchen atorvastatin (LIPITOR) 20 MG tablet Take 20 mg by mouth daily.    . cyclobenzaprine (FLEXERIL) 10 MG tablet TAKE ONE TABLET BY MOUTH THREE TIMES DAILY AS NEEDED FOR MUSCLE SPASMS. (Patient taking differently: Take 10 mg by mouth 3 (three) times daily as needed for muscle spasms. ) 90 tablet 5  . cyclobenzaprine (FLEXERIL) 10 MG tablet Take 1 tablet (10 mg total) by mouth 2 (two) times daily as needed for muscle spasms. 40 tablet 1  . divalproex (DEPAKOTE) 250 MG DR tablet TAKE THREE (3) TABLETS BY MOUTH AT BEDTIME. 90 tablet 2  . FLUoxetine (PROZAC) 20 MG  capsule Take 1 capsule (20 mg total) by mouth 2 (two) times daily. 60 capsule 2  . hydrocortisone (ANUSOL-HC) 2.5 % rectal cream Place 1 application rectally 2 (two) times daily. (Patient taking differently: Place 1 application rectally daily as needed for hemorrhoids. ) 30 g 1  . levothyroxine (SYNTHROID, LEVOTHROID) 150 MCG tablet Take 150 mcg by mouth daily before breakfast.   5  . lisinopril (PRINIVIL,ZESTRIL) 10 MG tablet Take 10 mg by mouth daily.    . meloxicam (MOBIC) 7.5 MG tablet TAKE ONE TABLET BY MOUTH DAILY 30 tablet 5  . oxyCODONE-acetaminophen (PERCOCET) 7.5-325 MG tablet Take 1 tablet by mouth every 4 (four) hours as needed for up to 7 days for severe pain. 42 tablet 0  . pantoprazole (PROTONIX) 40 MG tablet Take 1 tablet (40 mg total) by mouth 2 (two) times daily. 60 tablet 5  . SYMPROIC 0.2 MG TABS TAKE ONE TABLET BY MOUTH DAILY FOR CONSTIPATION ON DAYS YOU TAKE YOUR PAIN MEDICATION. 30 tablet 3  . traZODone (DESYREL) 100 MG tablet Take 2 tablets (200 mg total) by mouth at bedtime. 30 tablet 2   No current facility-administered medications for this visit.     Allergies as of 08/11/2019 - Review Complete 07/29/2019  Allergen Reaction Noted  . Neomycin-bacitracin zn-polymyx Other (See Comments) 02/27/2008  . Penicillins Hives   . Sulfonamide derivatives Hives and Swelling 02/27/2008  ROS:  General: Negative for anorexia, weight loss, fever, chills, fatigue, weakness. ENT: Negative for hoarseness, difficulty swallowing , nasal congestion. CV: Negative for chest pain, angina, palpitations, dyspnea on exertion, peripheral edema.  Respiratory: Negative for dyspnea at rest, dyspnea on exertion, cough, sputum, wheezing.  GI: See history of present illness. GU:  Negative for dysuria, hematuria, urinary incontinence, urinary frequency, nocturnal urination.  Endo: Negative for unusual weight change.    Physical Examination:   There were no vitals taken for this visit.   General: Well-nourished, well-developed in no acute distress.  Eyes: No icterus. Mouth: Oropharyngeal mucosa moist and pink , no lesions erythema or exudate. Lungs: Clear to auscultation bilaterally.  Heart: Regular rate and rhythm, no murmurs rubs or gallops.  Abdomen: Bowel sounds are normal, nontender, nondistended, no hepatosplenomegaly or masses, no abdominal bruits or hernia , no rebound or guarding.   Extremities: No lower extremity edema. No clubbing or deformities. Neuro: Alert and oriented x 4   Skin: Warm and dry, no jaundice.   Psych: Alert and cooperative, normal mood and affect.  Labs:  ***  Imaging Studies: Dg Knee Ap/lat W/sunrise Right  Result Date: 07/28/2019 X-rays right knee prosthesis slight tilt of the patella otherwise alignment is normal No evidence of loosening Impression stable prosthesis right knee with slight tilt of the patella

## 2019-08-11 NOTE — Telephone Encounter (Signed)
Patient called for refill: oxyCODONE-acetaminophen (PERCOCET) 7.5-325 MG tablet  - [name], [facility]

## 2019-08-18 ENCOUNTER — Ambulatory Visit (INDEPENDENT_AMBULATORY_CARE_PROVIDER_SITE_OTHER): Payer: Medicare Other | Admitting: Orthopedic Surgery

## 2019-08-18 ENCOUNTER — Encounter: Payer: Self-pay | Admitting: Orthopedic Surgery

## 2019-08-18 ENCOUNTER — Other Ambulatory Visit: Payer: Self-pay

## 2019-08-18 ENCOUNTER — Ambulatory Visit: Payer: Medicare Other

## 2019-08-18 VITALS — BP 124/77 | HR 86 | Ht 63.0 in | Wt 217.0 lb

## 2019-08-18 DIAGNOSIS — M5136 Other intervertebral disc degeneration, lumbar region: Secondary | ICD-10-CM

## 2019-08-18 DIAGNOSIS — M51369 Other intervertebral disc degeneration, lumbar region without mention of lumbar back pain or lower extremity pain: Secondary | ICD-10-CM

## 2019-08-18 DIAGNOSIS — M545 Low back pain, unspecified: Secondary | ICD-10-CM

## 2019-08-18 DIAGNOSIS — M79605 Pain in left leg: Secondary | ICD-10-CM

## 2019-08-18 DIAGNOSIS — G894 Chronic pain syndrome: Secondary | ICD-10-CM | POA: Diagnosis not present

## 2019-08-18 MED ORDER — OXYCODONE-ACETAMINOPHEN 7.5-325 MG PO TABS: ORAL_TABLET | ORAL | 0 refills | Status: DC

## 2019-08-18 NOTE — Patient Instructions (Signed)
Radicular Pain Radicular pain is a type of pain that spreads from your back or neck along a spinal nerve. Spinal nerves are nerves that leave the spinal cord and go to the muscles. Radicular pain is sometimes called radiculopathy, radiculitis, or a pinched nerve. When you have this type of pain, you may also have weakness, numbness, or tingling in the area of your body that is supplied by the nerve. The pain may feel sharp and burning. Depending on which spinal nerve is affected, the pain may occur in the:  Neck area (cervical radicular pain). You may also feel pain, numbness, weakness, or tingling in the arms.  Mid-spine area (thoracic radicular pain). You would feel this pain in the back and chest. This type is rare.  Lower back area (lumbar radicular pain). You would feel this pain as low back pain. You may feel pain, numbness, weakness, or tingling in the buttocks or legs. Sciatica is a type of lumbar radicular pain that shoots down the back of the leg. Radicular pain occurs when one of the spinal nerves becomes irritated or squeezed (compressed). It is often caused by something pushing on a spinal nerve, such as one of the bones of the spine (vertebrae) or one of the round cushions between vertebrae (intervertebral disks). This can result from:  An injury.  Wear and tear or aging of a disk.  The growth of a bone spur that pushes on the nerve. Radicular pain often goes away when you follow instructions from your health care provider for relieving pain at home. Follow these instructions at home: Managing pain      If directed, put ice on the affected area: ? Put ice in a plastic bag. ? Place a towel between your skin and the bag. ? Leave the ice on for 20 minutes, 2-3 times a day.  If directed, apply heat to the affected area as often as told by your health care provider. Use the heat source that your health care provider recommends, such as a moist heat pack or a heating pad. ? Place  a towel between your skin and the heat source. ? Leave the heat on for 20-30 minutes. ? Remove the heat if your skin turns bright red. This is especially important if you are unable to feel pain, heat, or cold. You may have a greater risk of getting burned. Activity   Do not sit or rest in bed for long periods of time.  Try to stay as active as possible. Ask your health care provider what type of exercise or activity is best for you.  Avoid activities that make your pain worse, such as bending and lifting.  Do not lift anything that is heavier than 10 lb (4.5 kg), or the limit that you are told, until your health care provider says that it is safe.  Practice using proper technique when lifting items. Proper lifting technique involves bending your knees and rising up.  Do strength and range-of-motion exercises only as told by your health care provider or physical therapist. General instructions  Take over-the-counter and prescription medicines only as told by your health care provider.  Pay attention to any changes in your symptoms.  Keep all follow-up visits as told by your health care provider. This is important. ? Your health care provider may send you to a physical therapist to help with this pain. Contact a health care provider if:  Your pain and other symptoms get worse.  Your pain medicine is not   helping.  Your pain has not improved after a few weeks of home care.  You have a fever. Get help right away if:  You have severe pain, weakness, or numbness.  You have difficulty with bladder or bowel control. Summary  Radicular pain is a type of pain that spreads from your back or neck along a spinal nerve.  When you have radicular pain, you may also have weakness, numbness, or tingling in the area of your body that is supplied by the nerve.  The pain may feel sharp or burning.  Radicular pain may be treated with ice, heat, medicines, or physical therapy. This  information is not intended to replace advice given to you by your health care provider. Make sure you discuss any questions you have with your health care provider. Document Released: 10/05/2004 Document Revised: 03/12/2018 Document Reviewed: 03/12/2018 Elsevier Patient Education  2020 [employer]  

## 2019-08-18 NOTE — Progress Notes (Signed)
Diane [name]  08/18/2019  Body mass index is 38.44 kg/m.   HISTORY SECTION :  Chief Complaint  Patient presents with  . 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  She went to see her primary care doctor who sent [place]oped[place] in [place] who recommended physical therapy.  She did 4 weeks of physical therapy the pain increased shin giving way of the left side and left leg and presents here for further management    Review of Systems  Constitutional: Negative for chills, fever, malaise/fatigue and weight loss.  Musculoskeletal: Positive for joint pain.  Neurological: Positive for weakness.     has a past medical history of Allergic rhinitis, Anxiety, Arthritis, Cataracts, bilateral, Chronic back pain, Chronic pain syndrome, Constipation, Depression, GERD (gastroesophageal reflux disease), Headaches, cluster (3 yrs ago), High cholesterol, Hypertension, Hypothyroid, Immature cataract, Limp, Mania (HCC), PTSD (post-traumatic stress disorder), Right lumbar radiculopathy, Seizures (HCC) (20 yrs ago), Sleep apnea, and 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(HCC).   Past Surgical History:  Procedure Laterality Date  . BACK SURGERY    . CATARACT EXTRACTION W/PHACO Left 01/04/2016   Procedure: CATARACT EXTRACTION PHACO AND INTRAOCULAR LENS PLACEMENT (IOC);  Surgeon: Jethro BolusMark Shapiro, MD;  Location: [facility];  Service: Ophthalmology;  Laterality: Left;  CDE:4.70  . CATARACT EXTRACTION W/PHACO Right 01/18/2016   Procedure: CATARACT EXTRACTION PHACO AND INTRAOCULAR LENS PLACEMENT RIGHT EYE; CDE:  4.66;  Surgeon: Jethro BolusMark Shapiro, MD;  Location: [facility];  Service: Ophthalmology;  Laterality: Right;  . CHOLECYSTECTOMY    . COLONOSCOPY  10/16/2008   ZOX:[id number]:Hemorrhoids, otherwise normal rectum; pancolonic diverticula  the remainder of the colonic mucosa appeared normal. Next TCS due 10/2013.  Marland Kitchen. COLONOSCOPY  06/28/2006   RMR: Internal hemorrhoids. Diminutive rectal polyps, cold biopsied/ Pancolonic diverticula. Polyps in the right colon removed   . COLONOSCOPY WITH PROPOFOL N/A 05/20/2015   RMR: Pancolonic diverticulosis. Redundant colon   . ESOPHAGOGASTRODUODENOSCOPY  10/16/2008   XBJ:[id number]:Small hiatal hernia otherwise normal esophagus, stomach  D1, D2, status post passage of a 56-French Maloney dilator  . ESOPHAGOGASTRODUODENOSCOPY  06/28/2006   FAO:[id number]:Normal esophagus. Small hiatal hernia. Otherwise normal stomach, D1 and D2, status post passage of a 56 [place] Maloney dilator  . ESOPHAGOGASTRODUODENOSCOPY (EGD) WITH ESOPHAGEAL DILATION N/A 07/14/2013   RMR: Abnormal esophagus of uncertain significance-status post esophageal biospy. small hiatal hernia.   Marland Kitchen. ESOPHAGOGASTRODUODENOSCOPY (EGD) WITH PROPOFOL N/A 05/20/2015   RMR: NORMAL EGD status post maloney dilation   . ESOPHAGOGASTRODUODENOSCOPY (EGD) WITH PROPOFOL N/A 12/23/2018   Dr. Jena Gaussrourk: normal esophagus, s/p esophageal dilation.   Marland Kitchen. FOOT SURGERY     left foot-  . INCISION AND DRAINAGE Right 01/03/2013   Procedure: INCISION AND DRAINAGE;  Surgeon: Vickki HearingStanley E Harrison, MD;  Location: [facility];  Service: Orthopedics;  Laterality: Right;  . KNEE ARTHROSCOPY  01/2012   right  . KNEE ARTHROSCOPY WITH LATERAL RELEASE Right 01/03/2013   Procedure: Lateral Release Patella Right Knee;  Surgeon: Vickki HearingStanley E Harrison, MD;  Location: [facility];  Service: Orthopedics;  Laterality: Right;  . left foot  x 2  . LUMBAR DISC SURGERY  L4-5  . MALONEY DILATION N/A 05/20/2015   Procedure: Elease HashimotoMALONEY DILATION;  Surgeon: Corbin Adeobert M Rourk, MD;  Location: [facility];  Service: Endoscopy;  Laterality: N/A;  Maloney dilator # [id number]  . MALONEY DILATION N/A 12/23/2018   Procedure: Venia Minks DILATION;  Surgeon: Daneil Dolin, MD;  Location: [facility];  Service: Endoscopy;  Laterality: N/A;  . oophoectomy   bilateral  . PARTIAL HYSTERECTOMY    . TOTAL KNEE ARTHROPLASTY  07/01/2012   Procedure: TOTAL KNEE ARTHROPLASTY;  Surgeon: Carole Civil, MD;  Location: [facility];  Service: Orthopedics;  Laterality: Right;  . TOTAL KNEE REVISION Right 01/03/2013   Procedure: Patellaplasty Right Knee;  Surgeon: Carole Civil, MD;  Location: [facility];  Service: Orthopedics;  Laterality: Right;    Body mass index is 38.44 kg/m.     Current Outpatient Medications:  .  ALPRAZolam (XANAX) 0.5 MG tablet, Take one twice a day and two at bedtime, Disp: 120 tablet, Rfl: 2 .  AMITIZA 24 MCG capsule, TAKE ONE CAPSULE BY MOUTH TWICE DAILY. TAKE WITH A MEAL., Disp: 60 capsule, Rfl: 5 .  amLODipine (NORVASC) 5 MG tablet, Take 5 mg by mouth daily., Disp: , Rfl:  .  atorvastatin (LIPITOR) 20 MG tablet, Take 20 mg by mouth daily., Disp: , Rfl:  .  cyclobenzaprine (FLEXERIL) 10 MG tablet, Take 1 tablet (10 mg total) by mouth 2 (two) times daily as needed for muscle spasms., Disp: 40 tablet, Rfl: 1 .  divalproex (DEPAKOTE) 250 MG DR tablet, TAKE THREE (3) TABLETS BY MOUTH AT BEDTIME., Disp: 90 tablet, Rfl: 2 .  FLUoxetine (PROZAC) 20 MG capsule, Take 1 capsule (20 mg total) by mouth 2 (two) times daily., Disp: 60 capsule, Rfl: 2 .  hydrocortisone (ANUSOL-HC) 2.5 % rectal cream, Place 1 application rectally 2 (two) times daily. (Patient taking differently: Place 1 application rectally daily as needed for hemorrhoids. ), Disp: 30 g, Rfl: 1 .  levothyroxine (SYNTHROID, LEVOTHROID) 150 MCG tablet, Take 150 mcg by mouth daily before breakfast. , Disp: , Rfl: 5 .  lisinopril (PRINIVIL,ZESTRIL) 10 MG tablet, Take 10 mg by mouth daily., Disp: , Rfl:  .  meloxicam (MOBIC) 7.5 MG tablet, TAKE ONE TABLET BY MOUTH DAILY, Disp: 30 tablet, Rfl: 5 .  oxyCODONE-acetaminophen (PERCOCET) 7.5-325 MG tablet, TAKE ONE TABLET BY MOUTH EVERY 4 HOURS AS NEEDED FOR UP TO 7 DAYS FOR SEVERE PAIN, Disp: 42 tablet, Rfl: 0 .  pantoprazole (PROTONIX)  40 MG tablet, Take 1 tablet (40 mg total) by mouth 2 (two) times daily., Disp: 60 tablet, Rfl: 5 .  SYMPROIC 0.2 MG TABS, TAKE ONE TABLET BY MOUTH DAILY FOR CONSTIPATION ON DAYS YOU TAKE YOUR PAIN MEDICATION., Disp: 30 tablet, Rfl: 3 .  traZODone (DESYREL) 100 MG tablet, Take 2 tablets (200 mg total) by mouth at bedtime., Disp: 30 tablet, Rfl: 2   PHYSICAL EXAM SECTION: 1) BP 124/77   Pulse 86   Ht 5\' 3"  (1.6 m)   Wt 217 lb (98.4 kg)   BMI 38.44 kg/m   Body mass index is 38.44 kg/m. General appearance: Well-developed well-nourished no gross deformities  2) Cardiovascular normal pulse and perfusion in the lower  extremities normal color without edema  3) Neurologically deep tendon reflexes are equal and normal, no sensation loss or deficits no pathologic reflexes  4) Psychological: Awake alert and oriented x3 mood and affect normal  5) Skin no lacerations or ulcerations no nodularity no palpable masses, no erythema or nodularity  6) Musculoskeletal:   Back Exam   Tenderness  The patient is experiencing tenderness in the lumbar.  Range of Motion  Extension: abnormal  Flexion: abnormal  Lateral bend right: abnormal  Lateral bend left: abnormal  Rotation right: abnormal  Rotation left: abnormal   Muscle Strength  Right Quadriceps:  5/5  Left Quadriceps:  5/5  Right Hamstrings:  5/5  Left Hamstrings:  5/5   Tests  Straight leg raise right: negative Straight leg raise left: positive  Reflexes  Patellar: 2/4 Achilles: normal  Other  Sensation: decreased Gait: abnormal        MEDICAL DECISION SECTION:  No diagnosis found.  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  Patient already on Flexeril and oxycodone no improvement after therapy took meloxicam as well    Refill oxycodone   MRI LUMBAR  Call results to patient  Meds ordered this encounter  Medications   . oxyCODONE-acetaminophen (PERCOCET) 7.5-325 MG tablet    Sig: TAKE ONE TABLET BY MOUTH EVERY 4 HOURS AS NEEDED FOR UP TO 7 DAYS FOR SEVERE PAIN    Dispense:  42 tablet    Refill:  0      2:24 PM Fuller Canada, MD  08/18/2019

## 2019-08-19 ENCOUNTER — Other Ambulatory Visit: Payer: Self-pay

## 2019-08-19 NOTE — Patient Outreach (Signed)
[facility]) Care Management  08/19/2019  Diane [name] May 17, 1951 [id number]   Medication Adherence call to Diane [name] HIPPA Compliant Voice message left with a call back number. [name] is showing past due on Atorvastatin 20 mg under [place].   [facility] Direct Dial [phone]  Fax [phone] [email or web address]

## 2019-08-21 ENCOUNTER — Telehealth: Payer: Self-pay | Admitting: Gastroenterology

## 2019-08-21 DIAGNOSIS — N9489 Other specified conditions associated with female genital organs and menstrual cycle: Secondary | ICD-10-CM

## 2019-08-21 DIAGNOSIS — N958 Other specified menopausal and perimenopausal disorders: Secondary | ICD-10-CM

## 2019-08-21 NOTE — Telephone Encounter (Signed)
Patient was on my schedule recently and canceled appointment.  She has pending issues which she wanted to postpone due to Covid several months ago as discussed during virtual visit.   Most importantly she needs a pelvic ultrasound due to left adnexal cyst seen on CT requiring follow-up.  If she continues to have issues with swallowing, would offer barium pill esophagram.

## 2019-08-21 NOTE — Telephone Encounter (Signed)
Let's get pelvic u/s done now. Hold off on barium swallow.

## 2019-08-21 NOTE — Telephone Encounter (Signed)
Spoke with pt. She is ready to schedule the pelvic ultrasound at this time. Pt states that her swallowing issue aren't as bad as they were previously but they're still present from time to time. Pt is ok with doing a Barium pill esophagram when LSL feels it's time to schedule. Please advise if Barium needs to be scheduled.  Pt also scheduled an appointment 09/2019 to evaluate there abdomen. Pt isn't sure if it's weight gain in her abdomen or a possible knot on her upper abdomen.

## 2019-08-22 NOTE — Telephone Encounter (Signed)
Noted. R[facility] please arrange pelvic u/s and hold off on barium swallowing test.

## 2019-08-22 NOTE — Addendum Note (Signed)
Addended by: Hassan Rowan on: 08/22/2019 09:18 AM   Modules accepted: Orders

## 2019-08-22 NOTE — Telephone Encounter (Addendum)
Spoke to [place] at [facility] radiology. She advised to order US Pelvis complete with transvaginal.  [place] scheduled for 08/28/19 at 10:30am, arrive at 10:15am with full bladder. Called and informed pt. Letter mailed.

## 2019-08-25 ENCOUNTER — Other Ambulatory Visit: Payer: Self-pay | Admitting: Orthopedic Surgery

## 2019-08-25 DIAGNOSIS — G894 Chronic pain syndrome: Secondary | ICD-10-CM

## 2019-08-25 MED ORDER — OXYCODONE-ACETAMINOPHEN 7.5-325 MG PO TABS: ORAL_TABLET | ORAL | 0 refills | Status: DC

## 2019-08-25 NOTE — Telephone Encounter (Signed)
Patient requests refill on Oxycodone/Acetaminophen 7.5-325  Mgs.  Qty  79  Sig: TAKE ONE TABLET BY MOUTH EVERY 4 HOURS AS NEEDED FOR UP TO 7 DAYS FOR SEVERE PAIN  Patient states she uses [facility] In [place]

## 2019-08-26 ENCOUNTER — Other Ambulatory Visit: Payer: Self-pay

## 2019-08-26 ENCOUNTER — Ambulatory Visit (HOSPITAL_COMMUNITY)
Admission: RE | Admit: 2019-08-26 | Discharge: 2019-08-26 | Disposition: A | Discharge status: Home / Self Care | Payer: Medicare Other | Source: Ambulatory Visit | Attending: Orthopedic Surgery | Admitting: Orthopedic Surgery

## 2019-08-26 DIAGNOSIS — M545 Low back pain, unspecified: Secondary | ICD-10-CM

## 2019-08-26 DIAGNOSIS — M79605 Pain in left leg: Secondary | ICD-10-CM | POA: Insufficient documentation

## 2019-08-26 DIAGNOSIS — M48061 Spinal stenosis, lumbar region without neurogenic claudication: Secondary | ICD-10-CM | POA: Diagnosis not present

## 2019-08-26 DIAGNOSIS — M5136 Other intervertebral disc degeneration, lumbar region: Secondary | ICD-10-CM | POA: Diagnosis not present

## 2019-08-28 ENCOUNTER — Ambulatory Visit (HOSPITAL_COMMUNITY)
Admission: RE | Admit: 2019-08-28 | Discharge: 2019-08-28 | Disposition: A | Discharge status: Home / Self Care | Payer: Medicare Other | Source: Ambulatory Visit | Attending: Gastroenterology | Admitting: Gastroenterology

## 2019-08-28 ENCOUNTER — Other Ambulatory Visit: Payer: Self-pay

## 2019-08-28 DIAGNOSIS — N958 Other specified menopausal and perimenopausal disorders: Secondary | ICD-10-CM

## 2019-08-28 DIAGNOSIS — N9489 Other specified conditions associated with female genital organs and menstrual cycle: Secondary | ICD-10-CM | POA: Insufficient documentation

## 2019-08-28 DIAGNOSIS — Z780 Asymptomatic menopausal state: Secondary | ICD-10-CM | POA: Insufficient documentation

## 2019-09-01 ENCOUNTER — Other Ambulatory Visit: Payer: Self-pay | Admitting: Orthopedic Surgery

## 2019-09-01 DIAGNOSIS — G894 Chronic pain syndrome: Secondary | ICD-10-CM

## 2019-09-01 MED ORDER — OXYCODONE-ACETAMINOPHEN 7.5-325 MG PO TABS: ORAL_TABLET | ORAL | 0 refills | Status: DC

## 2019-09-01 NOTE — Telephone Encounter (Signed)
Call from patient; requests refill: oxyCODONE-acetaminophen (PERCOCET) 7.5-325 MG tablet / 42 tablets  -Pharmacy is [place], [facility]

## 2019-09-02 ENCOUNTER — Telehealth: Payer: Self-pay | Admitting: Orthopedic Surgery

## 2019-09-02 NOTE — Telephone Encounter (Signed)
Patient returned call regarding results (missed Dr Ruthe Mannan call today, 09/02/19) regarding MRI results.

## 2019-09-02 NOTE — Telephone Encounter (Signed)
Left message    1. 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 2. 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 The canal is patent after remote left laminotomy. 3. Prominent facet arthropathy at L3-4 and below. 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 4. L5-S1 moderate left more than right foraminal narrowing. 5. L3-4 mild borderline moderate spinal stenosis.   Electronically Signed   By: Monte Fantasia M.D.   On: 08/27/2019 06:48

## 2019-09-02 NOTE — Telephone Encounter (Signed)
IMPRESSION: 1. 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 2. 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 The canal is patent after remote left laminotomy. 3. Prominent facet arthropathy at L3-4 and below. 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 4. L5-S1 moderate left more than right foraminal narrowing. 5. L3-4 mild borderline moderate spinal stenosis.   Electronically Signed   By: Monte Fantasia M.D.   On: 08/27/2019 06:48

## 2019-09-08 ENCOUNTER — Other Ambulatory Visit: Payer: Self-pay

## 2019-09-08 ENCOUNTER — Other Ambulatory Visit: Payer: Self-pay | Admitting: Orthopedic Surgery

## 2019-09-08 DIAGNOSIS — G894 Chronic pain syndrome: Secondary | ICD-10-CM

## 2019-09-08 DIAGNOSIS — N958 Other specified menopausal and perimenopausal disorders: Secondary | ICD-10-CM

## 2019-09-08 DIAGNOSIS — N9489 Other specified conditions associated with female genital organs and menstrual cycle: Secondary | ICD-10-CM

## 2019-09-08 MED ORDER — OXYCODONE-ACETAMINOPHEN 7.5-325 MG PO TABS: ORAL_TABLET | ORAL | 0 refills | Status: DC

## 2019-09-08 NOTE — Telephone Encounter (Signed)
Patient requests refill on Oxycodone/Acetaminophen 7.5-325  Mgs.  Qty  58  Sig: TAKE ONE TABLET BY MOUTH EVERY 4 HOURS AS NEEDED FOR UP TO 7 DAYS FOR SEVERE PAIN  Patient states she uses [facility] in [place]

## 2019-09-11 ENCOUNTER — Telehealth: Payer: Self-pay | Admitting: Radiology

## 2019-09-11 ENCOUNTER — Telehealth: Payer: Self-pay | Admitting: Orthopedic Surgery

## 2019-09-11 DIAGNOSIS — G894 Chronic pain syndrome: Secondary | ICD-10-CM

## 2019-09-11 MED ORDER — OXYCODONE-ACETAMINOPHEN 7.5-325 MG PO TABS: ORAL_TABLET | ORAL | 0 refills | Status: DC

## 2019-09-11 NOTE — Telephone Encounter (Signed)
IMPRESSION: 1. 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 2. 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 The canal is patent after remote left laminotomy. 3. Prominent facet arthropathy at L3-4 and below. 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 4. L5-S1 moderate left more than right foraminal narrowing. 5. L3-4 mild borderline moderate spinal stenosis.   Electronically Signed   By: Monte Fantasia M.D.   On: 08/27/2019 06:48  MRI findings noted above  The patient will be referred to neurosurgery for early January    medication refills were done prior to the weekend holiday.

## 2019-09-11 NOTE — Telephone Encounter (Signed)
-----   Message from Carole Civil, MD sent at 09/11/2019 10:21 AM EST ----- Neurosurgery referral

## 2019-09-11 NOTE — Telephone Encounter (Signed)
I called her Referral was sent in November, patient did not call them back to schedule, I have given her the number to call and schedule. [phone]

## 2019-09-22 ENCOUNTER — Other Ambulatory Visit: Payer: Self-pay | Admitting: Orthopedic Surgery

## 2019-09-22 DIAGNOSIS — G894 Chronic pain syndrome: Secondary | ICD-10-CM

## 2019-09-22 NOTE — Telephone Encounter (Signed)
Patient requests refill on Oxycodone/Acetaminophen 7.5-325 mgs.  Qty 42  Sig: TAKE ONE TABLET BY MOUTH EVERY 4 HOURS AS NEEDED FOR UP TO 7 DAYS FOR SEVERE PAIN  Patient states she uses [facility] in [place]

## 2019-09-23 MED ORDER — OXYCODONE-ACETAMINOPHEN 7.5-325 MG PO TABS: ORAL_TABLET | ORAL | 0 refills | Status: DC

## 2019-09-24 ENCOUNTER — Encounter: Payer: Self-pay | Admitting: Gastroenterology

## 2019-09-24 ENCOUNTER — Encounter: Payer: Self-pay | Admitting: *Deleted

## 2019-09-24 ENCOUNTER — Telehealth: Payer: Self-pay | Admitting: *Deleted

## 2019-09-24 ENCOUNTER — Other Ambulatory Visit: Payer: Self-pay

## 2019-09-24 ENCOUNTER — Ambulatory Visit (INDEPENDENT_AMBULATORY_CARE_PROVIDER_SITE_OTHER): Payer: Medicare Other | Admitting: Gastroenterology

## 2019-09-24 VITALS — BP 137/75 | HR 91 | Temp 96.6°F | Ht 63.0 in | Wt 215.0 lb

## 2019-09-24 DIAGNOSIS — R634 Abnormal weight loss: Secondary | ICD-10-CM | POA: Insufficient documentation

## 2019-09-24 DIAGNOSIS — R1909 Other intra-abdominal and pelvic swelling, mass and lump: Secondary | ICD-10-CM | POA: Diagnosis not present

## 2019-09-24 DIAGNOSIS — R630 Anorexia: Secondary | ICD-10-CM | POA: Diagnosis not present

## 2019-09-24 DIAGNOSIS — K5900 Constipation, unspecified: Secondary | ICD-10-CM | POA: Diagnosis not present

## 2019-09-24 NOTE — Patient Instructions (Signed)
1. Continue Amitiza 24 mcg twice daily with food for constipation. 2. Continue Symproic once daily on days you take your pain medication. 3. Add MiraLAX 1 capful twice daily to soften stools. 4. Please have your labs done. 5. Please have your CT done. 6. We will contact you with results as available. 7. See your gynecologist as scheduled.

## 2019-09-24 NOTE — Progress Notes (Signed)
Primary Care Physician: Elfredia Nevins, MD  Primary Gastroenterologist:  Roetta Sessions, MD   Chief Complaint  Patient presents with  . feels knot in mid abdomen    HPI: Diane [name] is a 69 y.o. [name] here for follow-up.  She was last seen in May for abdominal cramping via virtual visit via telephone.  Patient has a history of chronic GERD, constipation, dysphagia.  History of colonic adenomas, next colonoscopy due September 2021.  Historically constipation has been difficult to manage.  She has been on Linzess 290 mcg, Amitiza 24 mcg twice daily without good results.  She has been on Trulance 3 mg daily along with Symproic on days she takes her pain medications.  Insurance denied Symproic, she had a try Relistor.  Although patient states she has been wrongly taken Symproic.  GERD typically well controlled on pantoprazole.  EGD April 2020 with normal esophagus status post esophageal dilation.  She has a history of left adnexal cyst seen on previous CT in February 2020, had recent follow-up pelvic ultrasound.  Appears to be a benign process but advised to see GYN because of size of the cyst.  December 2019: 254.8 pounds April 2020: 235 pounds November 2020: 221 pounds December 2020: 217 pounds Today: 215 pounds  Patient complains of progressive weight loss, poor appetite.  Eats only 1-2 meals per day, supplements with Ensure.  No nausea or vomiting.  No drive to eat.  No abdominal pain with meals.  She is concerned about enlarging "knot" in the upper abdominal region.  Notes it more with standing but also with lying down.  When you push on it hurts.  She noted over the last several months and it seems to be getting larger.  Bowel movements are sluggish.  BM once per week, stools are hard.  No melena or rectal bleeding.  No heartburn or dysphagia.   Current Outpatient Medications  Medication Sig Dispense Refill  . ALPRAZolam (XANAX) 0.5 MG tablet Take one twice a day and two at  bedtime 120 tablet 2  . AMITIZA 24 MCG capsule TAKE ONE CAPSULE BY MOUTH TWICE DAILY. TAKE WITH A MEAL. 60 capsule 5  . amLODipine (NORVASC) 5 MG tablet Take 5 mg by mouth daily.    Marland Kitchen atorvastatin (LIPITOR) 20 MG tablet Take 20 mg by mouth daily.    . cyclobenzaprine (FLEXERIL) 10 MG tablet Take 1 tablet (10 mg total) by mouth 2 (two) times daily as needed for muscle spasms. 40 tablet 1  . divalproex (DEPAKOTE) 250 MG DR tablet TAKE THREE (3) TABLETS BY MOUTH AT BEDTIME. 90 tablet 2  . FLUoxetine (PROZAC) 20 MG capsule Take 1 capsule (20 mg total) by mouth 2 (two) times daily. 60 capsule 2  . hydrocortisone (ANUSOL-HC) 2.5 % rectal cream Place 1 application rectally 2 (two) times daily. (Patient taking differently: Place 1 application rectally daily as needed for hemorrhoids. ) 30 g 1  . levothyroxine (SYNTHROID, LEVOTHROID) 150 MCG tablet Take 150 mcg by mouth daily before breakfast.   5  . lisinopril (PRINIVIL,ZESTRIL) 10 MG tablet Take 10 mg by mouth daily.    . meloxicam (MOBIC) 7.5 MG tablet TAKE ONE TABLET BY MOUTH DAILY 30 tablet 5  . oxyCODONE-acetaminophen (PERCOCET) 7.5-325 MG tablet TAKE ONE TABLET BY MOUTH EVERY 4 HOURS AS NEEDED FOR UP TO 7 DAYS FOR SEVERE PAIN 42 tablet 0  . pantoprazole (PROTONIX) 40 MG tablet Take 1 tablet (40 mg total) by mouth 2 (two)  times daily. 60 tablet 5  . SYMPROIC 0.2 MG TABS TAKE ONE TABLET BY MOUTH DAILY FOR CONSTIPATION ON DAYS YOU TAKE YOUR PAIN MEDICATION. 30 tablet 3  . traZODone (DESYREL) 100 MG tablet Take 2 tablets (200 mg total) by mouth at bedtime. 30 tablet 2   No current facility-administered medications for this visit.    Allergies as of 09/24/2019 - Review Complete 09/24/2019  Allergen Reaction Noted  . Neomycin-bacitracin zn-polymyx Other (See Comments) 02/27/2008  . Penicillins Hives   . Sulfonamide derivatives Hives and Swelling 02/27/2008    ROS:  General: Negative for fever, chills, fatigue, weakness.  See HPI ENT: Negative  for hoarseness, difficulty swallowing , nasal congestion. CV: Negative for chest pain, angina, palpitations, dyspnea on exertion, peripheral edema.  Respiratory: Negative for dyspnea at rest, dyspnea on exertion, cough, sputum, wheezing.  GI: See history of present illness. GU:  Negative for dysuria, hematuria, urinary incontinence, urinary frequency, nocturnal urination.  Endo: See HPI   Physical Examination:   BP 137/75   Pulse 91   Temp (!) 96.6 F (35.9 C) (Temporal)   Ht 5\' 3"  (1.6 m)   Wt 215 lb (97.5 kg)   BMI 38.09 kg/m   General: Well-nourished, well-developed in no acute distress.  Eyes: No icterus. Mouth: Oropharyngeal mucosa moist and pink , no lesions erythema or exudate. Lungs: Clear to auscultation bilaterally.  Heart: Regular rate and rhythm, no murmurs rubs or gallops.  Abdomen: Bowel sounds are normal, nontender, nondistended, no hepatosplenomegaly or masses, no abdominal bruits, no rebound or guarding.  Some protuberance of the upper abdomen with standing, fullness just left of midline in the epigastrium.  Also noted with lying down.  No obvious hernia.  There is a definitive fullness in the upper abdominal region, cannot exclude mass.  Possible mild diastases recti. Extremities: No lower extremity edema. No clubbing or deformities. Neuro: Alert and oriented x 4   Skin: Warm and dry, no jaundice.   Psych: Alert and cooperative, normal mood and affect.  Labs:  Lab Results  Component Value Date   CREATININE 0.88 10/28/2018   BUN 12 10/28/2018   NA 137 10/28/2018   K 3.7 10/28/2018   CL 102 10/28/2018   CO2 26 10/28/2018   Lab Results  Component Value Date   ALT 27 10/28/2018   AST 22 10/28/2018   ALKPHOS 54 10/28/2018   BILITOT 0.9 10/28/2018   Lab Results  Component Value Date   WBC 6.2 10/28/2018   HGB 13.8 10/28/2018   HCT 45.3 10/28/2018   MCV 89.0 10/28/2018   PLT 210 10/28/2018   Lab Results  Component Value Date   LIPASE 19 10/28/2018     Imaging Studies: MR Lumbar Spine Wo Contrast  Result Date: 08/27/2019 CLINICAL DATA:  Lumbar radiculopathy.  Osteoporosis EXAM: MRI LUMBAR SPINE WITHOUT CONTRAST TECHNIQUE: Multiplanar, multisequence MR imaging of the lumbar spine was performed. No intravenous contrast was administered. COMPARISON:  Lumbar spine CT 10/28/2018 FINDINGS: Segmentation:  5 lumbar type vertebrae Alignment:  Normal Vertebrae:  No fracture, evidence of discitis, or bone lesion. Conus medullaris and cauda equina: Conus extends to the L1 level. Conus and cauda equina appear normal. 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 Mild posterior element spurring. There is mild mass effect on the cord. T12- L1: Central disc protrusion and posterior longitudinal ligament ossification with mild posterior element hypertrophy. There is  spinal stenosis with cord flattening. Moderate right foraminal narrowing from disc bulge and facet spurring L1-L2: Posterior element hypertrophy greater on the right. No impingement L2-L3: Posterior element hypertrophy. Mild disc bulging with tiny right paracentral protrusion that is noncompressive L3-L4: Degenerative facet arthropathy with hypertrophy/spurring. Mild disc bulging. Mild, borderline moderate spinal stenosis without focal compression L4-L5: Greatest level of disc narrowing with chronic endplate degenerative changes anteriorly. There is an inferiorly migrating left paracentral protrusion which is chronic based on prior. The descending L5 nerve root is contacted by the herniation without definite compression. There has been prior left laminotomy. Facet ankylosis at this level. L5-S1:Facet degeneration with bulky spurring causing moderate narrowing of both foramina. Patent spinal canal. IMPRESSION: 1. 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 2. 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 The canal is patent after remote left laminotomy. 3. Prominent facet arthropathy at L3-4 and below. 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 4. L5-S1 moderate left more than right foraminal narrowing. 5. L3-4 mild borderline moderate spinal stenosis. Electronically Signed   By: Monte Fantasia M.D.   On: 08/27/2019 06:48   US PELVIC COMPLETE WITH TRANSVAGINAL  Result Date: 08/28/2019 CLINICAL DATA:  LEFT adnexal cyst on CT, hypertension, former smoker, prior hysterectomy and RIGHT oophorectomy, postmenopausal EXAM: TRANSABDOMINAL AND TRANSVAGINAL ULTRASOUND OF PELVIS TECHNIQUE: Both transabdominal and transvaginal ultrasound examinations of the pelvis were performed. Transabdominal technique was performed for global imaging of the pelvis including uterus, ovaries, adnexal regions, and pelvic cul-de-sac. It was necessary to proceed with endovaginal exam following the transabdominal exam to visualize the LEFT adnexal cyst. COMPARISON:  CT abdomen and pelvis 10/28/2018 FINDINGS: Uterus Surgically absent Endometrium N/A Right ovary Surgically absent Left ovary Measurements: 2.7 x 1.3 x 1.6 cm = volume: 3.0 mL. Grossly normal morphology, see below. Other findings No free pelvic fluid. Large cystic structure identified in LEFT adnexa, abuts LEFT ovary, 6.7 x 3.8 x 3.7 cm, generally simple features. IMPRESSION: Post hysterectomy and RIGHT oophorectomy. Large LEFT adnexal cyst 6.7 cm greatest size, showing simple features, identified adjacent to LEFT ovary, could be of ovarian or paraovarian origin. This has benign characteristics, but given its size, follow up by ultrasound is recommended in 6 months. This recommendation follows consensus guidelines: Simple Adnexal Cysts: SRU Consensus  Conference Update on Follow-up and Reporting. Radiology 2019; [id number]. Electronically Signed   By: Lavonia Dana M.D.   On: 08/28/2019 13:43

## 2019-09-24 NOTE — Telephone Encounter (Signed)
PA not required for CT per [facility] website

## 2019-09-25 NOTE — Assessment & Plan Note (Addendum)
Patient is lost 35 to 40 pounds in the past 1 year, unintentional weight loss.  Her appetite has been poor.  She feels enlarging fullness in the upper abdomen somewhat tender with palpation.  There is no obvious hernia in this area but a definite fullness is noted, cannot exclude possible mild diastases recti.  Potentially change in abdominal contour more noticeable with significant weight loss.  Recommend CT abdomen and pelvis with contrast to rule out underlying malignancy and to evaluate abdominal mass in light of significant weight loss and poor appetite.  Update labs.  Encouraged her to keep upcoming GYN appointment to evaluate left adnexal cyst.

## 2019-09-25 NOTE — Assessment & Plan Note (Signed)
Chronic constipation, currently having a bowel movement once per week.  Continues to have hard stools.  She will continue Amitiza 24 mcg twice daily with food, Symproic once daily on days she takes her pain medication.  We will also add MiraLAX 1 capful twice daily to keep stools soft.

## 2019-09-29 ENCOUNTER — Other Ambulatory Visit: Payer: Self-pay

## 2019-09-29 ENCOUNTER — Other Ambulatory Visit: Payer: Self-pay | Admitting: Orthopedic Surgery

## 2019-09-29 DIAGNOSIS — G894 Chronic pain syndrome: Secondary | ICD-10-CM

## 2019-09-29 NOTE — Telephone Encounter (Signed)
Oxycodone-Acetaminophen 7.5/325mg    Take one tablet by mouth every 4 hours as needed for up to 7 days for severe pain.  PATIENT USES [name] [place]

## 2019-09-29 NOTE — Progress Notes (Signed)
Cc'ed to pcp °

## 2019-09-30 DIAGNOSIS — M48061 Spinal stenosis, lumbar region without neurogenic claudication: Secondary | ICD-10-CM | POA: Diagnosis not present

## 2019-09-30 DIAGNOSIS — I10 Essential (primary) hypertension: Secondary | ICD-10-CM | POA: Diagnosis not present

## 2019-10-01 ENCOUNTER — Telehealth: Payer: Self-pay | Admitting: Obstetrics & Gynecology

## 2019-10-01 NOTE — Telephone Encounter (Signed)
Called patient regarding appointment and the following message was left: ° ° °We have you scheduled for an upcoming appointment at our office. At this time, we are still not allowing visitors during the appointment, however, a support person, over age 70, may accompany you to your appointment if assistance is needed for safety or care concerns. Otherwise, support persons should remain outside until the visit is complete.  ° °We ask if you are sick, have any symptoms of COVID, have had any exposure to anyone suspected or confirmed of having COVID-19, or are awaiting test results for COVID-19, to call our office as we may need to reschedule you for a virtual visit or schedule your appointment for a later date.   ° °Please know we will ask you these questions or similar questions when you arrive for your appointment and understand this is how we are keeping everyone safe.   ° °Also,to keep you safe, please use the provided hand sanitizer when you enter the office. We are asking everyone in the office to wear a mask to help prevent the spread of °germs. If you have a mask of your own, please wear it to your appointment, if not, we are happy to provide one for you. ° °Thank you for understanding and your cooperation.  ° ° °[facility] Staff ° ° ° ° ° °

## 2019-10-02 ENCOUNTER — Encounter: Payer: Self-pay | Admitting: Obstetrics & Gynecology

## 2019-10-02 ENCOUNTER — Ambulatory Visit (INDEPENDENT_AMBULATORY_CARE_PROVIDER_SITE_OTHER): Payer: Medicare Other | Admitting: Obstetrics & Gynecology

## 2019-10-02 ENCOUNTER — Other Ambulatory Visit: Payer: Self-pay

## 2019-10-02 VITALS — BP 130/72 | HR 80 | Ht 63.0 in | Wt 215.0 lb

## 2019-10-02 DIAGNOSIS — K668 Other specified disorders of peritoneum: Secondary | ICD-10-CM

## 2019-10-02 DIAGNOSIS — N9489 Other specified conditions associated with female genital organs and menstrual cycle: Secondary | ICD-10-CM

## 2019-10-02 NOTE — Progress Notes (Signed)
Follow up appointment for results  Chief Complaint  Patient presents with  . Ovarian Cyst    Blood pressure 130/72, pulse 80, height 5\' 3"  (1.6 m), weight 215 lb (97.5 kg). CLINICAL DATA:  Tripped and fell last Thursday.  Abdominal pain.  EXAM: CT ABDOMEN AND PELVIS WITH CONTRAST  TECHNIQUE: Multidetector CT imaging of the abdomen and pelvis was performed using the standard protocol following bolus administration of intravenous contrast.  CONTRAST:  125mL OMNIPAQUE IOHEXOL 300 MG/ML  SOLN  COMPARISON:  None.  FINDINGS: Lower chest: The lung bases are clear. The heart is borderline enlarged. No pericardial effusion. No pleural effusion.  Hepatobiliary: No focal hepatic lesions or intrahepatic biliary dilatation. No acute hepatic injury or perihepatic fluid collections. The gallbladder is surgically absent. No common bile duct dilatation.  Pancreas: No mass, inflammation or ductal dilatation. Mild pancreatic atrophy. No acute injury or peri pancreatic fluid collection.  Spleen: Normal size. No focal lesions. No acute injury or perisplenic fluid collections.  Adrenals/Urinary Tract: Adrenal the adrenal glands and kidneys are unremarkable. No acute renal injury or perinephric fluid collections. The bladder appears normal.  Stomach/Bowel: The stomach, duodenum, small bowel and colon are unremarkable. No acute inflammatory changes, mass lesions or obstructive findings. Colonic diverticulosis without findings for acute diverticulitis.  Vascular/Lymphatic: The aorta is normal in caliber. No dissection. The branch vessels are patent. The major venous structures are patent. No mesenteric or retroperitoneal mass or adenopathy. Small scattered lymph nodes are noted.  Reproductive: The uterus is surgically absent. There is a left adnexal cyst which appears simple. It measures 4 cm. Ultrasound follow-up is suggested. I do not see the right ovary.  Other: No  ascites or abdominal wall hernia. No inguinal mass or adenopathy.  Musculoskeletal: No significant bony findings. 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  IMPRESSION: 1. No acute abdominal/pelvic findings, mass lesions or lymphadenopathy. 2. Status post cholecystectomy.  No biliary dilatation. 3. Diffuse colonic diverticulosis without findings for acute diverticulitis. 4. 4 cm simple appearing left adnexal cyst. Disc based on its size and the patient's age I would recommend a follow-up pelvic ultrasound examination in 4-6 months.   Electronically Signed   By: Marijo Sanes M.D.   On: 10/28/2018 21:26   CLINICAL DATA:  LEFT adnexal cyst on CT, hypertension, former smoker, prior hysterectomy and RIGHT oophorectomy, postmenopausal  EXAM: TRANSABDOMINAL AND TRANSVAGINAL ULTRASOUND OF PELVIS  TECHNIQUE: Both transabdominal and transvaginal ultrasound examinations of the pelvis were performed. Transabdominal technique was performed for global imaging of the pelvis including uterus, ovaries, adnexal regions, and pelvic cul-de-sac. It was necessary to proceed with endovaginal exam following the transabdominal exam to visualize the LEFT adnexal cyst.  COMPARISON:  CT abdomen and pelvis 10/28/2018  FINDINGS: Uterus  Surgically absent  Endometrium  N/A  Right ovary  Surgically absent  Left ovary  Measurements: 2.7 x 1.3 x 1.6 cm = volume: 3.0 mL. Grossly normal morphology, see below.  Other findings  No free pelvic fluid. Large cystic structure identified in LEFT adnexa, abuts LEFT ovary, 6.7 x 3.8 x 3.7 cm, generally simple features.  IMPRESSION: Post hysterectomy and RIGHT oophorectomy.  Large LEFT adnexal cyst 6.7 cm greatest size, showing simple features, identified adjacent to LEFT ovary, could be of ovarian or paraovarian origin.  This has benign characteristics, but given its size, follow up  by ultrasound is recommended in 6 months. This recommendation follows consensus guidelines: Simple Adnexal Cysts: SRU Consensus Conference Update on Follow-up and Reporting.  Radiology 2019; [id number].   Electronically Signed   By: Ulyses Southward M.D.   On: 08/28/2019 13:43    MEDS ordered this encounter: No orders of the defined types were placed in this encounter.   Orders for this encounter: Orders Placed This Encounter  Procedures  . US PELVIS TRANSVAGINAL NON-OB (TV ONLY)  . US PELVIS (TRANSABDOMINAL ONLY)  . CA 125    Impression: 1. Adnexal mass  - CA 125 - US PELVIS TRANSVAGINAL NON-OB (TV ONLY); Future - US PELVIS (TRANSABDOMINAL ONLY); Future  2. Peritoneal inclusion cyst    Plan: Repeat sonogram 6 months for stability evaluation This appears to be peritoneal inclusion and is not ovarian, I reviewed images from CT 2/20 as well as sonogram 12/20  CA 125 is pending as well  Follow Up: Return in about 6 months (around 03/31/2020) for GYN sono, Follow up, with Dr Despina Hidden.       Face to face time:  20 minutes  Greater than 50% of the visit time was spent in counseling and coordination of care with the patient.  The summary and outline of the counseling and care coordination is summarized in the note above.   All questions were answered.  Past Medical History:  Diagnosis Date  . Allergic rhinitis   . Anxiety   . Arthritis   . Cataracts, bilateral   . Chronic back pain    Sees pain mamagement clinic  . Chronic pain syndrome   . Constipation   . Depression   . GERD (gastroesophageal reflux disease)   . Headaches, cluster 3 yrs ago   last one  . High cholesterol   . Hypertension   . Hypothyroid   . Immature cataract   . Limp    right leg  . Mania (HCC)   . PTSD (post-traumatic stress disorder)   . Right lumbar radiculopathy   . Seizures (HCC) 20 yrs ago   unknown etiology-on meds  . Sleep apnea    STOP BANG score=4  . Substance abuse in  remission [facility])     Past Surgical History:  Procedure Laterality Date  . BACK SURGERY    . CATARACT EXTRACTION W/PHACO Left 01/04/2016   Procedure: CATARACT EXTRACTION PHACO AND INTRAOCULAR LENS PLACEMENT (IOC);  Surgeon: Jethro Bolus, MD;  Location: [facility];  Service: Ophthalmology;  Laterality: Left;  CDE:4.70  . CATARACT EXTRACTION W/PHACO Right 01/18/2016   Procedure: CATARACT EXTRACTION PHACO AND INTRAOCULAR LENS PLACEMENT RIGHT EYE; CDE:  4.66;  Surgeon: Jethro Bolus, MD;  Location: [facility];  Service: Ophthalmology;  Laterality: Right;  . CHOLECYSTECTOMY    . COLONOSCOPY  10/16/2008   SJG:[id number], otherwise normal rectum; pancolonic diverticula the remainder of the colonic mucosa appeared normal. Next TCS due 10/2013.  Marland Kitchen COLONOSCOPY  06/28/2006   RMR: Internal hemorrhoids. Diminutive rectal polyps, cold biopsied/ Pancolonic diverticula. Polyps in the right colon removed   . COLONOSCOPY WITH PROPOFOL N/A 05/20/2015   RMR: Pancolonic diverticulosis. Redundant colon   . ESOPHAGOGASTRODUODENOSCOPY  10/16/2008   YYT:KPTWS hiatal hernia otherwise normal esophagus, stomach  D1, D2, status post passage of a 56-French Maloney dilator  . ESOPHAGOGASTRODUODENOSCOPY  06/28/2006   FKC:LEXNTZ esophagus. Small hiatal hernia. Otherwise normal stomach, D1 and D2, status post passage of a 56 [place] Maloney dilator  . ESOPHAGOGASTRODUODENOSCOPY (EGD) WITH ESOPHAGEAL DILATION N/A 07/14/2013   RMR: Abnormal esophagus of uncertain significance-status post esophageal biospy. small hiatal hernia.   Marland Kitchen ESOPHAGOGASTRODUODENOSCOPY (EGD) WITH PROPOFOL N/A 05/20/2015   RMR: NORMAL EGD status  post maloney dilation   . ESOPHAGOGASTRODUODENOSCOPY (EGD) WITH PROPOFOL N/A 12/23/2018   Dr. Jena Gauss: normal esophagus, s/p esophageal dilation.   Marland Kitchen FOOT SURGERY     left foot-  . INCISION AND DRAINAGE Right 01/03/2013   Procedure: INCISION AND DRAINAGE;  Surgeon: Vickki Hearing, MD;  Location: [facility];  Service: Orthopedics;   Laterality: Right;  . KNEE ARTHROSCOPY  01/2012   right  . KNEE ARTHROSCOPY WITH LATERAL RELEASE Right 01/03/2013   Procedure: Lateral Release Patella Right Knee;  Surgeon: Vickki Hearing, MD;  Location: [facility];  Service: Orthopedics;  Laterality: Right;  . left foot  x 2  . LUMBAR DISC SURGERY  L4-5  . MALONEY DILATION N/A 05/20/2015   Procedure: Elease Hashimoto DILATION;  Surgeon: Corbin Ade, MD;  Location: [facility];  Service: Endoscopy;  Laterality: N/A;  Maloney dilator # 54  . MALONEY DILATION N/A 12/23/2018   Procedure: Elease Hashimoto DILATION;  Surgeon: Corbin Ade, MD;  [facility];  Service: Endoscopy;  Laterality: N/A;  . oophoectomy  bilateral  . PARTIAL HYSTERECTOMY    . TOTAL KNEE ARTHROPLASTY  07/01/2012   Procedure: TOTAL KNEE ARTHROPLASTY;  Surgeon: Vickki Hearing, MD;  Location: [facility];  Service: Orthopedics;  Laterality: Right;  . TOTAL KNEE REVISION Right 01/03/2013   Procedure: Patellaplasty Right Knee;  Surgeon: Vickki Hearing, MD;  Location: [facility];  Service: Orthopedics;  Laterality: Right;    OB History    Gravida  2   Para  2   Term  2   Preterm      AB      Living  2     SAB      TAB      Ectopic      Multiple      Live Births                Social History   Socioeconomic History  . Marital status: Legally Separated    Spouse name: Not on file  . Number of children: Not on file  . Years of education: 12th grade  . Highest education level: Not on file  Occupational History  . Occupation: disabled    Associate Professor: UNEMPLOYED  Tobacco Use  . Smoking status: Former Smoker    Packs/day: 0.25    Years: 5.00    Pack years: 1.25    Types: Cigarettes    Quit date: 06/26/2003    Years since quitting: 16.2  . Smokeless tobacco: Never Used  Substance and Sexual Activity  . Alcohol use: Yes    Alcohol/week: 0.0 standard drinks    Comment: occasionally; once a month.  . Drug use: No    Comment: has a past history of street  drug use  . Sexual activity: Never    Birth control/protection: Surgical  Other Topics Concern  . Not on file  Social History Narrative  . Not on file   Social Determinants of Health   Financial Resource Strain:   . Difficulty of Paying Living Expenses: Not on file  Food Insecurity:   . Worried About Programme researcher, broadcasting/film/video in the Last Year: Not on file  . Ran Out of Food in the Last Year: Not on file  Transportation Needs:   . Lack of Transportation (Medical): Not on file  . Lack of Transportation (Non-Medical): Not on file  Physical Activity:   . Days of Exercise per Week: Not on file  . Minutes of  Exercise per Session: Not on file  Stress:   . Feeling of Stress : Not on file  Social Connections:   . Frequency of Communication with Friends and Family: Not on file  . Frequency of Social Gatherings with Friends and Family: Not on file  . Attends Religious Services: Not on file  . Active Member of Clubs or Organizations: Not on file  . Attends Banker Meetings: Not on file  . Marital Status: Not on file    Family History  Problem Relation Age of Onset  . Alcohol abuse Mother   . Anxiety disorder Mother   . Alcohol abuse Father   . Anxiety disorder Father   . Bipolar disorder Sister   . Alcohol abuse Brother   . Anxiety disorder Brother   . Drug abuse Brother   . Dementia Paternal Aunt   . ADD / ADHD Grandchild   . ADD / ADHD Grandchild   . Drug abuse Brother   . Alcohol abuse Brother   . Anxiety disorder Brother   . Seizures Brother   . Alcohol abuse Brother   . Anxiety disorder Brother   . Seizures Brother   . Alcohol abuse Brother   . Anxiety disorder Brother   . Alcohol abuse Brother   . Anxiety disorder Brother   . Heart disease Other   . Diabetes Other   . Alcohol abuse Other   . Depression Neg Hx   . OCD Neg Hx   . Paranoid behavior Neg Hx   . Schizophrenia Neg Hx   . Sexual abuse Neg Hx   . Physical abuse Neg Hx   . Colon cancer Neg Hx

## 2019-10-03 LAB — CA 125: Cancer Antigen (CA) 125: 11.5 U/mL (ref 0.0–38.1)

## 2019-10-06 ENCOUNTER — Other Ambulatory Visit: Payer: Self-pay | Admitting: Orthopedic Surgery

## 2019-10-06 ENCOUNTER — Other Ambulatory Visit (HOSPITAL_COMMUNITY): Payer: Self-pay | Admitting: Psychiatry

## 2019-10-06 DIAGNOSIS — G894 Chronic pain syndrome: Secondary | ICD-10-CM

## 2019-10-06 DIAGNOSIS — F3130 Bipolar disorder, current episode depressed, mild or moderate severity, unspecified: Secondary | ICD-10-CM

## 2019-10-06 MED ORDER — OXYCODONE-ACETAMINOPHEN 7.5-325 MG PO TABS: 1.0000 | ORAL_TABLET | ORAL | 0 refills | Status: DC | PRN

## 2019-10-06 NOTE — Telephone Encounter (Signed)
Patient requests refill on Oxycodone/Acetaminophen 7.5-325  Mgs.  Qty  42  Sig: TAKE ONE TABLET BY MOUTH EVERY 4 HOURS AS NEEDED FOR UP TO 7 DAYS FOR SEVERE PAIN       Patient states she uses [name] in [place]

## 2019-10-07 ENCOUNTER — Other Ambulatory Visit (HOSPITAL_COMMUNITY): Payer: Self-pay | Admitting: Psychiatry

## 2019-10-07 DIAGNOSIS — F5105 Insomnia due to other mental disorder: Secondary | ICD-10-CM

## 2019-10-08 ENCOUNTER — Other Ambulatory Visit: Payer: Self-pay

## 2019-10-08 ENCOUNTER — Other Ambulatory Visit: Payer: Self-pay | Admitting: Orthopedic Surgery

## 2019-10-08 ENCOUNTER — Ambulatory Visit (HOSPITAL_COMMUNITY)
Admission: RE | Admit: 2019-10-08 | Discharge: 2019-10-08 | Disposition: A | Discharge status: Home / Self Care | Payer: Medicare Other | Source: Ambulatory Visit | Attending: Gastroenterology | Admitting: Gastroenterology

## 2019-10-08 DIAGNOSIS — R109 Unspecified abdominal pain: Secondary | ICD-10-CM | POA: Diagnosis not present

## 2019-10-08 DIAGNOSIS — R630 Anorexia: Secondary | ICD-10-CM

## 2019-10-08 DIAGNOSIS — K5900 Constipation, unspecified: Secondary | ICD-10-CM | POA: Diagnosis not present

## 2019-10-08 DIAGNOSIS — R1909 Other intra-abdominal and pelvic swelling, mass and lump: Secondary | ICD-10-CM | POA: Diagnosis not present

## 2019-10-08 DIAGNOSIS — M1711 Unilateral primary osteoarthritis, right knee: Secondary | ICD-10-CM

## 2019-10-08 DIAGNOSIS — R634 Abnormal weight loss: Secondary | ICD-10-CM | POA: Insufficient documentation

## 2019-10-08 DIAGNOSIS — G894 Chronic pain syndrome: Secondary | ICD-10-CM

## 2019-10-08 LAB — POCT I-STAT CREATININE: Creatinine, Ser: 0.8 mg/dL (ref 0.44–1.00)

## 2019-10-08 MED ORDER — IOHEXOL 300 MG/ML  SOLN
100.0000 mL | Freq: Once | INTRAMUSCULAR | Status: AC | PRN
  Administered 2019-10-08: 100 mL via INTRAVENOUS

## 2019-10-13 ENCOUNTER — Other Ambulatory Visit: Payer: Self-pay | Admitting: Orthopedic Surgery

## 2019-10-13 DIAGNOSIS — G894 Chronic pain syndrome: Secondary | ICD-10-CM

## 2019-10-13 MED ORDER — OXYCODONE-ACETAMINOPHEN 7.5-325 MG PO TABS: 1.0000 | ORAL_TABLET | ORAL | 0 refills | Status: DC | PRN

## 2019-10-13 NOTE — Telephone Encounter (Signed)
Patient called for refill: oxyCODONE-acetaminophen (PERCOCET) 7.5-325 MG tablet - [name], [place]

## 2019-10-15 ENCOUNTER — Telehealth: Payer: Self-pay

## 2019-10-15 ENCOUNTER — Other Ambulatory Visit: Payer: Self-pay

## 2019-10-15 DIAGNOSIS — R634 Abnormal weight loss: Secondary | ICD-10-CM

## 2019-10-15 NOTE — Telephone Encounter (Signed)
-----   Message from Myra Rude, LPN sent at 11/11/4399  4:19 PM EST ----- Darlina Rumpf,  This pt called, Verlon Au wanted this pt to have a chest xray, I dont see where she put the order in and it doesn't look like it was sent to you either. I told the pt she didn't need an appointment but the orders would need to be put in and to not go to the hospital before tomorrow afternoon or Thursday to give [place] time to get them in. Please see her CT results from [facility]. Can you put them in?

## 2019-10-15 NOTE — Telephone Encounter (Signed)
Chest x-ray ordered entered. Tried to call pt, no answer, LMOVM to inform her.

## 2019-10-20 ENCOUNTER — Other Ambulatory Visit: Payer: Self-pay

## 2019-10-20 ENCOUNTER — Telehealth: Payer: Self-pay

## 2019-10-20 ENCOUNTER — Ambulatory Visit (HOSPITAL_COMMUNITY)
Admission: RE | Admit: 2019-10-20 | Discharge: 2019-10-20 | Disposition: A | Discharge status: Home / Self Care | Payer: Medicare Other | Source: Ambulatory Visit | Attending: Gastroenterology | Admitting: Gastroenterology

## 2019-10-20 DIAGNOSIS — Z79899 Other long term (current) drug therapy: Secondary | ICD-10-CM | POA: Diagnosis not present

## 2019-10-20 DIAGNOSIS — R1909 Other intra-abdominal and pelvic swelling, mass and lump: Secondary | ICD-10-CM | POA: Diagnosis not present

## 2019-10-20 DIAGNOSIS — R634 Abnormal weight loss: Secondary | ICD-10-CM

## 2019-10-20 DIAGNOSIS — I10 Essential (primary) hypertension: Secondary | ICD-10-CM | POA: Diagnosis not present

## 2019-10-20 DIAGNOSIS — K5900 Constipation, unspecified: Secondary | ICD-10-CM | POA: Diagnosis not present

## 2019-10-20 DIAGNOSIS — D649 Anemia, unspecified: Secondary | ICD-10-CM | POA: Diagnosis not present

## 2019-10-20 NOTE — Telephone Encounter (Signed)
Oxycodone-Acetaminophen 7.5/325 mg  Qty  42 Tablets  Take 1 tablet by mouth every 4(four) hours as needed for severe pain.  PATIENT USES [name] IN [place]

## 2019-10-21 ENCOUNTER — Other Ambulatory Visit: Payer: Self-pay | Admitting: Orthopedic Surgery

## 2019-10-21 DIAGNOSIS — G894 Chronic pain syndrome: Secondary | ICD-10-CM

## 2019-10-22 LAB — COMPREHENSIVE METABOLIC PANEL
AG Ratio: 1.4 (calc) (ref 1.0–2.5)
ALT: 8 U/L (ref 6–29)
AST: 11 U/L (ref 10–35)
Albumin: 3.6 g/dL (ref 3.6–5.1)
Alkaline phosphatase (APISO): 42 U/L (ref 37–153)
BUN: 14 mg/dL (ref 7–25)
CO2: 31 mmol/L (ref 20–32)
Calcium: 8.7 mg/dL (ref 8.6–10.4)
Chloride: 106 mmol/L (ref 98–110)
Creat: 0.78 mg/dL (ref 0.50–0.99)
Globulin: 2.6 g/dL (calc) (ref 1.9–3.7)
Glucose, Bld: 92 mg/dL (ref 65–139)
Potassium: 4.1 mmol/L (ref 3.5–5.3)
Sodium: 143 mmol/L (ref 135–146)
Total Bilirubin: 0.6 mg/dL (ref 0.2–1.2)
Total Protein: 6.2 g/dL (ref 6.1–8.1)

## 2019-10-22 LAB — CBC WITH DIFFERENTIAL/PLATELET
Absolute Monocytes: 491 cells/uL (ref 200–950)
Basophils Absolute: 32 cells/uL (ref 0–200)
Basophils Relative: 0.6 %
Eosinophils Absolute: 49 cells/uL (ref 15–500)
Eosinophils Relative: 0.9 %
HCT: 34.7 % — ABNORMAL LOW (ref 35.0–45.0)
Hemoglobin: 11.4 g/dL — ABNORMAL LOW (ref 11.7–15.5)
Lymphs Abs: 2079 cells/uL (ref 850–3900)
MCH: 29 pg (ref 27.0–33.0)
MCHC: 32.9 g/dL (ref 32.0–36.0)
MCV: 88.3 fL (ref 80.0–100.0)
MPV: 11.5 fL (ref 7.5–12.5)
Monocytes Relative: 9.1 %
Neutro Abs: 2749 cells/uL (ref 1500–7800)
Neutrophils Relative %: 50.9 %
Platelets: 185 10*3/uL (ref 140–400)
RBC: 3.93 10*6/uL (ref 3.80–5.10)
RDW: 12.6 % (ref 11.0–15.0)
Total Lymphocyte: 38.5 %
WBC: 5.4 10*3/uL (ref 3.8–10.8)

## 2019-10-22 LAB — TEST AUTHORIZATION

## 2019-10-22 LAB — LIPASE: Lipase: 10 U/L (ref 7–60)

## 2019-10-22 LAB — IRON, TOTAL/TOTAL IRON BINDING CAP
%SAT: 38 % (calc) (ref 16–45)
Iron: 105 ug/dL (ref 45–160)
TIBC: 275 mcg/dL (calc) (ref 250–450)

## 2019-10-22 LAB — TSH+FREE T4: TSH W/REFLEX TO FT4: 0.12 mIU/L — ABNORMAL LOW (ref 0.40–4.50)

## 2019-10-22 LAB — T4, FREE: Free T4: 1.5 ng/dL (ref 0.8–1.8)

## 2019-10-22 LAB — FERRITIN: Ferritin: 206 ng/mL (ref 16–288)

## 2019-10-27 ENCOUNTER — Ambulatory Visit: Payer: Medicare Other | Admitting: Obstetrics & Gynecology

## 2019-10-27 ENCOUNTER — Encounter: Payer: Self-pay | Admitting: Obstetrics & Gynecology

## 2019-10-27 ENCOUNTER — Other Ambulatory Visit: Payer: Self-pay

## 2019-10-27 ENCOUNTER — Ambulatory Visit (INDEPENDENT_AMBULATORY_CARE_PROVIDER_SITE_OTHER): Payer: Medicare Other | Admitting: Obstetrics & Gynecology

## 2019-10-27 ENCOUNTER — Telehealth: Payer: Self-pay | Admitting: Orthopedic Surgery

## 2019-10-27 VITALS — BP 144/81 | HR 72 | Ht 63.0 in

## 2019-10-27 DIAGNOSIS — K668 Other specified disorders of peritoneum: Secondary | ICD-10-CM | POA: Diagnosis not present

## 2019-10-27 DIAGNOSIS — N9489 Other specified conditions associated with female genital organs and menstrual cycle: Secondary | ICD-10-CM | POA: Diagnosis not present

## 2019-10-27 NOTE — Telephone Encounter (Signed)
Patient requests refill on Oxycodone/Acetaminophen 7.5-325  Mgs.  Qty  42  Sig: TAKE ONE TABLET BY MOUTH EVERY 4 HOURS AS NEEDED FOR SEVERE PAIN.  Patient states she uses [name] in [place]

## 2019-10-27 NOTE — Progress Notes (Signed)
Preoperative History and Physical  Diane [name] is a 69 y.o. [id number] with No LMP recorded. Patient has had a hysterectomy. admitted for a laparoscopic left salpingo oophorectomy and cyst removal or drainage, if It is peritoneal inclusion cyst.  Pt has expereinced increased LLQ pain Has history of chronic pain and is being actively managed for such I shared with patient and niece the uncertainty of her post operative syptoms and response based on these facts  PMH:    Past Medical History:  Diagnosis Date  . Allergic rhinitis   . Anxiety   . Arthritis   . Cataracts, bilateral   . Chronic back pain    Sees pain mamagement clinic  . Chronic pain syndrome   . Constipation   . Depression   . GERD (gastroesophageal reflux disease)   . Headaches, cluster 3 yrs ago   last one  . High cholesterol   . Hypertension   . Hypothyroid   . Immature cataract   . Limp    right leg  . Mania (HCC)   . PTSD (post-traumatic stress disorder)   . Right lumbar radiculopathy   . Seizures (HCC) 20 yrs ago   unknown etiology-on meds  . Sleep apnea    STOP BANG score=4  . 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(HCC)     PSH:     Past Surgical History:  Procedure Laterality Date  . BACK SURGERY    . CATARACT EXTRACTION W/PHACO Left 01/04/2016   Procedure: CATARACT EXTRACTION PHACO AND INTRAOCULAR LENS PLACEMENT (IOC);  Surgeon: Jethro Bolus, MD;  Location: [facility];  Service: Ophthalmology;  Laterality: Left;  CDE:4.70  . CATARACT EXTRACTION W/PHACO Right 01/18/2016   Procedure: CATARACT EXTRACTION PHACO AND INTRAOCULAR LENS PLACEMENT RIGHT EYE; CDE:  4.66;  Surgeon: Jethro Bolus, MD;  Location: [facility];  Service: Ophthalmology;  Laterality: Right;  . CHOLECYSTECTOMY    . COLONOSCOPY  10/16/2008   JOA:[id number], otherwise normal rectum; pancolonic diverticula the remainder of the colonic mucosa appeared normal. Next TCS due 10/2013.  Marland Kitchen COLONOSCOPY  06/28/2006   RMR: Internal hemorrhoids. Diminutive rectal  polyps, cold biopsied/ Pancolonic diverticula. Polyps in the right colon removed   . COLONOSCOPY WITH PROPOFOL N/A 05/20/2015   RMR: Pancolonic diverticulosis. Redundant colon   . ESOPHAGOGASTRODUODENOSCOPY  10/16/2008   FUX:NATFT hiatal hernia otherwise normal esophagus, stomach  D1, D2, status post passage of a 56-French Maloney dilator  . ESOPHAGOGASTRODUODENOSCOPY  06/28/2006   DDU:KGURKY esophagus. Small hiatal hernia. Otherwise normal stomach, D1 and D2, status post passage of a 56 [place] Maloney dilator  . ESOPHAGOGASTRODUODENOSCOPY (EGD) WITH ESOPHAGEAL DILATION N/A 07/14/2013   RMR: Abnormal esophagus of uncertain significance-status post esophageal biospy. small hiatal hernia.   Marland Kitchen ESOPHAGOGASTRODUODENOSCOPY (EGD) WITH PROPOFOL N/A 05/20/2015   RMR: NORMAL EGD status post maloney dilation   . ESOPHAGOGASTRODUODENOSCOPY (EGD) WITH PROPOFOL N/A 12/23/2018   Dr. Jena Gauss: normal esophagus, s/p esophageal dilation.   Marland Kitchen FOOT SURGERY     left foot-  . INCISION AND DRAINAGE Right 01/03/2013   Procedure: INCISION AND DRAINAGE;  Surgeon: Vickki Hearing, MD;  Location: [facility];  Service: Orthopedics;  Laterality: Right;  . KNEE ARTHROSCOPY  01/2012   right  . KNEE ARTHROSCOPY WITH LATERAL RELEASE Right 01/03/2013   Procedure: Lateral Release Patella Right Knee;  Surgeon: Vickki Hearing, MD;  Location: [facility];  Service: Orthopedics;  Laterality: Right;  . left foot  x 2  . LUMBAR DISC SURGERY  L4-5  . MALONEY DILATION N/A  05/20/2015   Procedure: Elease Hashimoto DILATION;  Surgeon: Corbin Ade, MD;  Location: [facility];  Service: Endoscopy;  Laterality: N/A;  Maloney dilator # 54  . MALONEY DILATION N/A 12/23/2018   Procedure: Elease Hashimoto DILATION;  Surgeon: Corbin Ade, MD;  Location: [facility];  Service: Endoscopy;  Laterality: N/A;  . oophoectomy  bilateral  . PARTIAL HYSTERECTOMY    . TOTAL KNEE ARTHROPLASTY  07/01/2012   Procedure: TOTAL KNEE ARTHROPLASTY;  Surgeon: Vickki Hearing, MD;   Location: [facility];  Service: Orthopedics;  Laterality: Right;  . TOTAL KNEE REVISION Right 01/03/2013   Procedure: Patellaplasty Right Knee;  Surgeon: Vickki Hearing, MD;  Location: [facility];  Service: Orthopedics;  Laterality: Right;    POb/GynH:      OB History    Gravida  2   Para  2   Term  2   Preterm      AB      Living  2     SAB      TAB      Ectopic      Multiple      Live Births              SH:   Social History   Tobacco Use  . Smoking status: Former Smoker    Packs/day: 0.25    Years: 5.00    Pack years: 1.25    Types: Cigarettes    Quit date: 06/26/2003    Years since quitting: 16.3  . Smokeless tobacco: Never Used  Substance Use Topics  . Alcohol use: Yes    Alcohol/week: 0.0 standard drinks    Comment: occasionally; once a month.  . Drug use: No    Comment: has a past history of street drug use    FH:    Family History  Problem Relation Age of Onset  . Alcohol abuse Mother   . Anxiety disorder Mother   . Alcohol abuse Father   . Anxiety disorder Father   . Bipolar disorder Sister   . Alcohol abuse Brother   . Anxiety disorder Brother   . Drug abuse Brother   . Dementia Paternal Aunt   . ADD / ADHD Grandchild   . ADD / ADHD Grandchild   . Drug abuse Brother   . Alcohol abuse Brother   . Anxiety disorder Brother   . Seizures Brother   . Alcohol abuse Brother   . Anxiety disorder Brother   . Seizures Brother   . Alcohol abuse Brother   . Anxiety disorder Brother   . Alcohol abuse Brother   . Anxiety disorder Brother   . Heart disease Other   . Diabetes Other   . Alcohol abuse Other   . Depression Neg Hx   . OCD Neg Hx   . Paranoid behavior Neg Hx   . Schizophrenia Neg Hx   . Sexual abuse Neg Hx   . Physical abuse Neg Hx   . Colon cancer Neg Hx      Allergies:  See allegies listed will not close as configured Medications:       Current Outpatient Medications:  .  ALPRAZolam (XANAX) 0.5 MG tablet, Take one  twice a day and two at bedtime, Disp: 120 tablet, Rfl: 2 .  AMITIZA 24 MCG capsule, TAKE ONE CAPSULE BY MOUTH TWICE DAILY. TAKE WITH A MEAL., Disp: 60 capsule, Rfl: 5 .  amLODipine (NORVASC) 5 MG tablet, Take 5 mg by mouth daily.,  Disp: , Rfl:  .  atorvastatin (LIPITOR) 20 MG tablet, Take 20 mg by mouth daily., Disp: , Rfl:  .  cyclobenzaprine (FLEXERIL) 10 MG tablet, TAKE ONE TABLET BY MOUTH TWICE DAILY AS NEEDED FOR MUSCLE SPASMS., Disp: 40 tablet, Rfl: 1 .  divalproex (DEPAKOTE) 250 MG DR tablet, TAKE THREE (3) TABLETS BY MOUTH AT BEDTIME., Disp: 90 tablet, Rfl: 2 .  FLUoxetine (PROZAC) 20 MG capsule, Take 1 capsule (20 mg total) by mouth 2 (two) times daily., Disp: 60 capsule, Rfl: 2 .  hydrocortisone (ANUSOL-HC) 2.5 % rectal cream, Place 1 application rectally 2 (two) times daily. (Patient taking differently: Place 1 application rectally daily as needed for hemorrhoids. ), Disp: 30 g, Rfl: 1 .  levothyroxine (SYNTHROID, LEVOTHROID) 150 MCG tablet, Take 150 mcg by mouth daily before breakfast. , Disp: , Rfl: 5 .  lisinopril (PRINIVIL,ZESTRIL) 10 MG tablet, Take 10 mg by mouth daily., Disp: , Rfl:  .  meloxicam (MOBIC) 7.5 MG tablet, TAKE ONE TABLET BY MOUTH DAILY, Disp: 30 tablet, Rfl: 5 .  oxyCODONE-acetaminophen (PERCOCET) 7.5-325 MG tablet, TAKE ONE TABLET BY MOUTH EVERY 4 HOURS AS NEEDED FOR SEVERE PAIN., Disp: 42 tablet, Rfl: 0 .  pantoprazole (PROTONIX) 40 MG tablet, Take 1 tablet (40 mg total) by mouth 2 (two) times daily., Disp: 60 tablet, Rfl: 5 .  SYMPROIC 0.2 MG TABS, TAKE ONE TABLET BY MOUTH DAILY FOR CONSTIPATION ON DAYS YOU TAKE YOUR PAIN MEDICATION., Disp: 30 tablet, Rfl: 3 .  traZODone (DESYREL) 100 MG tablet, Take 2 tablets (200 mg total) by mouth at bedtime., Disp: 30 tablet, Rfl: 2  Review of Systems:   Review of Systems  Constitutional: Negative for fever, chills, weight loss, malaise/fatigue and diaphoresis.  HENT: Negative for hearing loss, ear pain, nosebleeds,  congestion, sore throat, neck pain, tinnitus and ear discharge.   Eyes: Negative for blurred vision, double vision, photophobia, pain, discharge and redness.  Respiratory: Negative for cough, hemoptysis, sputum production, shortness of breath, wheezing and stridor.   Cardiovascular: Negative for chest pain, palpitations, orthopnea, claudication, leg swelling and PND.  Gastrointestinal: Positive for abdominal pain. Negative for heartburn, nausea, vomiting, diarrhea, constipation, blood in stool and melena.  Genitourinary: Negative for dysuria, urgency, frequency, hematuria and flank pain.  Musculoskeletal: Negative for myalgias, back pain, joint pain and falls.  Skin: Negative for itching and rash.  Neurological: Negative for dizziness, tingling, tremors, sensory change, speech change, focal weakness, seizures, loss of consciousness, weakness and headaches.  Endo/Heme/Allergies: Negative for environmental allergies and polydipsia. Does not bruise/bleed easily.  Psychiatric/Behavioral: Negative for depression, suicidal ideas, hallucinations, memory loss and substance abuse. The patient is not nervous/anxious and does not have insomnia.      PHYSICAL EXAM:  Blood pressure (!) 144/81, pulse 72, height 5\' 3"  (1.6 m).    Vitals reviewed. Constitutional: She is oriented to person, place, and time. She appears well-developed and well-nourished.  HENT:  Head: Normocephalic and atraumatic.  Right Ear: External ear normal.  Left Ear: External ear normal.  Nose: Nose normal.  Mouth/Throat: Oropharynx is clear and moist.  Eyes: Conjunctivae and EOM are normal. Pupils are equal, round, and reactive to light. Right eye exhibits no discharge. Left eye exhibits no discharge. No scleral icterus.  Neck: Normal range of motion. Neck supple. No tracheal deviation present. No thyromegaly present.  Cardiovascular: Normal rate, regular rhythm, normal heart sounds and intact distal pulses.  Exam reveals no  gallop and no friction rub.   No murmur heard. Respiratory:  Effort normal and breath sounds normal. No respiratory distress. She has no wheezes. She has no rales. She exhibits no tenderness.  GI: Soft. Bowel sounds are normal. She exhibits no distension and no mass. There is tenderness. There is no rebound and no guarding.  Genitourinary:       Vulva is normal without lesions Vagina is pink moist without discharge Cervix normal in appearance and pap is normal Uterus is uterus absent Adnexa is negative with normal sized ovaries by sonogram  Musculoskeletal: Normal range of motion. She exhibits no edema and no tenderness.  Neurological: She is alert and oriented to person, place, and time. She has normal reflexes. She displays normal reflexes. No cranial nerve deficit. She exhibits normal muscle tone. Coordination normal.  Skin: Skin is warm and dry. No rash noted. No erythema. No pallor.  Psychiatric: She has a normal mood and affect. Her behavior is normal. Judgment and thought content normal.    Labs: Results for orders placed or performed in visit on 09/24/19 (from the past 336 hour(s))  CBC with Differential   Collection Time: 10/20/19  3:34 PM  Result Value Ref Range   WBC 5.4 3.8 - 10.8 Thousand/uL   RBC 3.93 3.80 - 5.10 Million/uL   Hemoglobin 11.4 (L) 11.7 - 15.5 g/dL   HCT 16.134.7 (L) 09.635.0 - 04.545.0 %   MCV 88.3 80.0 - 100.0 fL   MCH 29.0 27.0 - 33.0 pg   MCHC 32.9 32.0 - 36.0 g/dL   RDW 40.912.6 81.111.0 - 91.415.0 %   Platelets 185 140 - 400 Thousand/uL   MPV 11.5 7.5 - 12.5 fL   Neutro Abs 2,749 1,500 - 7,800 cells/uL   Lymphs Abs 2,079 850 - 3,900 cells/uL   Absolute Monocytes 491 200 - 950 cells/uL   Eosinophils Absolute 49 15 - 500 cells/uL   Basophils Absolute 32 0 - 200 cells/uL   Neutrophils Relative % 50.9 %   Total Lymphocyte 38.5 %   Monocytes Relative 9.1 %   Eosinophils Relative 0.9 %   Basophils Relative 0.6 %  Comprehensive metabolic panel   Collection Time: 10/20/19   3:34 PM  Result Value Ref Range   Glucose, Bld 92 65 - 139 mg/dL   BUN 14 7 - 25 mg/dL   Creat 7.820.78 9.560.50 - 2.130.99 mg/dL   BUN/Creatinine Ratio NOT APPLICABLE 6 - 22 (calc)   Sodium 143 135 - 146 mmol/L   Potassium 4.1 3.5 - 5.3 mmol/L   Chloride 106 98 - 110 mmol/L   CO2 31 20 - 32 mmol/L   Calcium 8.7 8.6 - 10.4 mg/dL   Total Protein 6.2 6.1 - 8.1 g/dL   Albumin 3.6 3.6 - 5.1 g/dL   Globulin 2.6 1.9 - 3.7 g/dL (calc)   AG Ratio 1.4 1.0 - 2.5 (calc)   Total Bilirubin 0.6 0.2 - 1.2 mg/dL   Alkaline phosphatase (APISO) 42 37 - 153 U/L   AST 11 10 - 35 U/L   ALT 8 6 - 29 U/L  Lipase   Collection Time: 10/20/19  3:34 PM  Result Value Ref Range   Lipase 10 7 - 60 U/L  TSH + free T4   Collection Time: 10/20/19  3:34 PM  Result Value Ref Range   TSH W/REFLEX TO FT4 0.12 (L) 0.40 - 4.50 mIU/L  T4, free   Collection Time: 10/20/19  3:34 PM  Result Value Ref Range   Free T4 1.5 0.8 - 1.8 ng/dL  Iron,Total/Total Iron Binding  Cap   Collection Time: 10/20/19  3:34 PM  Result Value Ref Range   Iron 105 45 - 160 mcg/dL   TIBC 419 622 - 297 mcg/dL (calc)   %SAT 38 16 - 45 % (calc)  Ferritin   Collection Time: 10/20/19  3:34 PM  Result Value Ref Range   Ferritin 206 16 - 288 ng/mL  TEST AUTHORIZATION   Collection Time: 10/20/19  3:34 PM  Result Value Ref Range   TEST NAME: FERRITIN IRON AND TOTAL IRON    TEST CODE: [id number]    CLIENT CONTACT: ALICIA MOORE    REPORT ALWAYS MESSAGE SIGNATURE      EKG: Orders placed or performed during the hospital encounter of 10/28/18  . ED EKG  . ED EKG  . EKG 12-Lead  . EKG 12-Lead  . EKG    Imaging Studies: DG Chest 2 View  Result Date: 10/20/2019 CLINICAL DATA:  Unexplained weight loss for 1 year, hypertension, tobacco abuse EXAM: CHEST - 2 VIEW COMPARISON:  10/28/2018 FINDINGS: The heart size and mediastinal contours are within normal limits. Both lungs are clear. The visualized skeletal structures are unremarkable. IMPRESSION:  No active cardiopulmonary disease. Electronically Signed   By: Sharlet Salina M.D.   On: 10/20/2019 23:04   CT ABDOMEN PELVIS W CONTRAST  Result Date: 10/08/2019 CLINICAL DATA:  Upper abdominal pain and fullness. 40 lb weight loss in past year. Chronic constipation. EXAM: CT ABDOMEN AND PELVIS WITH CONTRAST TECHNIQUE: Multidetector CT imaging of the abdomen and pelvis was performed using the standard protocol following bolus administration of intravenous contrast. CONTRAST:  OMNIPAQUE IOHEXOL 300 MG/ML  SOLN COMPARISON:  10/28/2018, and more recent ultrasound on 08/28/2019 FINDINGS: Lower Chest: No acute findings. Hepatobiliary: Tiny sub-cm low-attenuation lesion in the inferior right hepatic lobe is too small to characterize, but remains stable and most likely represents a tiny cyst. No other liver lesions identified. Prior cholecystectomy. No evidence of biliary obstruction. Pancreas:  Unremarkable. No mass or inflammatory changes. Spleen: Within normal limits in size and appearance. Adrenals/Urinary Tract: No masses identified. No evidence of hydronephrosis. Stomach/Bowel: No evidence of obstruction, inflammatory process or abnormal fluid collections. Diverticulosis is seen mainly involving the descending and sigmoid colon, however there is no evidence of diverticulitis. Vascular/Lymphatic: No pathologically enlarged lymph nodes. No abdominal aortic aneurysm. Reproductive: Previous hysterectomy. A simple appearing cyst is seen in the left adnexa which measures 5.0 x 3.4 cm on image 74/2, compared to 4.8 x 3.2 cm previously. No other pelvic masses or abnormal free fluid identified. Other:  None. Musculoskeletal:  No suspicious bone lesions identified. IMPRESSION: 1. No acute findings. 2. Colonic diverticulosis, without radiographic evidence of diverticulitis. 3. Stable 5 cm benign appearing cyst in left adnexa. Recommend continued follow-up by ultrasound in 1 year. Electronically Signed   By: Danae Orleans M.D.   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 Poor appetite 09/24/2019  . Abnormal weight loss 09/24/2019  . Upper abdominal mass 09/24/2019  . Dysphagia 01/30/2019  . Simple adnexal cyst greater than 1 cm in diameter in postmenopausal patient 11/08/2018  . Abdominal pain 08/09/2015  . Dysphagia, pharyngoesophageal phase   . H/O adenomatous polyp of colon 05/05/2015  . Constipation 02/02/2015  . Knee contusion 12/09/2013  . Esophageal dysphagia 07/02/2013  . Abdominal pain, epigastric 07/02/2013  . Chronic  pain syndrome 11/28/2012  . 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 Cellulitis 11/04/2012  . Postoperative wound breakdown 11/04/2012  . 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 Knee pain 08/13/2012  . Hypokalemia 08/01/2012  . Cellulitis, wound, post-operative 07/30/2012  . Degenerative tear of medial meniscus of right knee 01/12/2012  . OA (osteoarthritis) of knee 01/12/2012  . Right knee sprain 11/02/2011  . Instability of knee joint 03/29/2011  . URTICARIA 12/21/2009  . OTHER URINARY INCONTINENCE 07/18/2009  . FOOT PAIN, LEFT 05/26/2009  . DERMATITIS, CHRONIC 10/21/2008  . IMPAIRED GLUCOSE TOLERANCE 09/09/2008  . BACK PAIN WITH RADICULOPATHY 09/09/2008  . HYPOTHYROIDISM 02/27/2008  . HYPERLIPIDEMIA 02/27/2008  . ANXIETY 02/27/2008  . Bipolar I disorder, most recent episode (or current) depressed, unspecified 02/27/2008  . HYPERTENSION 02/27/2008  . ALLERGIC RHINITIS 02/27/2008  . GERD 02/27/2008  . HEADACHE 02/27/2008    Plan: Laparoscopic removal of left tube and ovary with removal and/or drainage of cyst dependiing on the origin, ovarian vs peritoneal inclusion  Pt aware despite the surgery if it is periotneal inclusion cyst it may recur in time and is unrelated to GYN issues going forward  Florian Buff 10/27/2019 10:36 AM

## 2019-10-28 ENCOUNTER — Other Ambulatory Visit: Payer: Self-pay

## 2019-10-28 DIAGNOSIS — G894 Chronic pain syndrome: Secondary | ICD-10-CM

## 2019-10-29 ENCOUNTER — Encounter (HOSPITAL_COMMUNITY): Payer: Self-pay | Admitting: Psychiatry

## 2019-10-29 ENCOUNTER — Ambulatory Visit (INDEPENDENT_AMBULATORY_CARE_PROVIDER_SITE_OTHER): Payer: Medicare Other | Admitting: Psychiatry

## 2019-10-29 ENCOUNTER — Other Ambulatory Visit: Payer: Self-pay

## 2019-10-29 DIAGNOSIS — F3130 Bipolar disorder, current episode depressed, mild or moderate severity, unspecified: Secondary | ICD-10-CM | POA: Diagnosis not present

## 2019-10-29 DIAGNOSIS — F5105 Insomnia due to other mental disorder: Secondary | ICD-10-CM

## 2019-10-29 MED ORDER — TRAZODONE HCL 100 MG PO TABS: 200.0000 mg | ORAL_TABLET | Freq: Every day | ORAL | 2 refills | Status: DC

## 2019-10-29 MED ORDER — OXYCODONE-ACETAMINOPHEN 7.5-325 MG PO TABS: 1.0000 | ORAL_TABLET | ORAL | 0 refills | Status: DC | PRN

## 2019-10-29 MED ORDER — FLUOXETINE HCL 20 MG PO CAPS: 20.0000 mg | ORAL_CAPSULE | Freq: Two times a day (BID) | ORAL | 2 refills | Status: DC

## 2019-10-29 MED ORDER — ALPRAZOLAM 0.5 MG PO TABS: ORAL_TABLET | ORAL | 2 refills | Status: DC

## 2019-10-29 MED ORDER — DIVALPROEX SODIUM 250 MG PO DR TAB: 750.0000 mg | DELAYED_RELEASE_TABLET | Freq: Every day | ORAL | 2 refills | Status: DC

## 2019-10-29 NOTE — Progress Notes (Signed)
Virtual Visit via Telephone Note  I connected with Diane [name] on 10/29/19 at  2:00 PM EST by telephone and verified that I am speaking with the correct person using two identifiers.   I discussed the limitations, risks, security and privacy concerns of performing an evaluation and management service by telephone and the availability of in person appointments. I also discussed with the patient that there may be a patient responsible charge related to this service. The patient expressed understanding and agreed to proceed.    I discussed the assessment and treatment plan with the patient. The patient was provided an opportunity to ask questions and all were answered. The patient agreed with the plan and demonstrated an understanding of the instructions.   The patient was advised to call back or seek an in-person evaluation if the symptoms worsen or if the condition fails to improve as anticipated.  I provided 15 minutes of non-face-to-face time during this encounter.   Diane Ruder, MD  [facility] MD/PA/NP OP Progress Note  10/29/2019 2:22 PM Diane [name]  MRN:  [id number]  Chief Complaint:  Chief Complaint    Depression; Anxiety; Follow-up     HPI: This patient is a 69 year old black female who lives with her niece in [place].  She is on disability.  She returns for follow-up today for bipolar disorder and anxiety.  The patient returns after 3 months and is assessed via telephone.  She has not been feeling all that great lately.  An adnexal mass was found on CT and she is going to have to have laparoscopic surgery next month to remove it as it is probably at adnexal cyst.  She is also still dealing with significant back pain.  She states that her kids were worried about her living alone and for the past 2 weeks she has been living with her niece.  For the most part it is going okay and is good to have someone to talk to.  She denies significant depression anxiety or thoughts of suicide or  self-harm.  She is sleeping well on the trazodone.  She has made an appointment with pain management to get injections for her back pain. Visit Diagnosis:    ICD-10-CM   1. Bipolar I disorder, most recent episode depressed (HCC)  F31.30 divalproex (DEPAKOTE) 250 MG DR tablet  2. Insomnia due to mental disorder  F51.05 ALPRAZolam (XANAX) 0.5 MG tablet    Past Psychiatric History: The patient has a history of 1 [facility] in [facility].  At that time she was using drugs and alcohol.  She has had outpatient treatment ever since  Past Medical History:  Past Medical History:  Diagnosis Date  . Allergic rhinitis   . Anxiety   . Arthritis   . Cataracts, bilateral   . Chronic back pain    Sees pain mamagement clinic  . Chronic pain syndrome   . Constipation   . Depression   . GERD (gastroesophageal reflux disease)   . Headaches, cluster 3 yrs ago   last one  . High cholesterol   . Hypertension   . Hypothyroid   . Immature cataract   . Limp    right leg  . Mania (HCC)   . PTSD (post-traumatic stress disorder)   . Right lumbar radiculopathy   . Seizures (HCC) 20 yrs ago   unknown etiology-on meds  . Sleep apnea    STOP BANG score=4  . 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(HCC)  Past Surgical History:  Procedure Laterality Date  . BACK SURGERY    . CATARACT EXTRACTION W/PHACO Left 01/04/2016   Procedure: CATARACT EXTRACTION PHACO AND INTRAOCULAR LENS PLACEMENT (IOC);  Surgeon: Rutherford Guys, MD;  Location: [facility];  Service: Ophthalmology;  Laterality: Left;  CDE:4.70  . CATARACT EXTRACTION W/PHACO Right 01/18/2016   Procedure: CATARACT EXTRACTION PHACO AND INTRAOCULAR LENS PLACEMENT RIGHT EYE; CDE:  4.66;  Surgeon: Rutherford Guys, MD;  Location: [facility];  Service: Ophthalmology;  Laterality: Right;  . CHOLECYSTECTOMY    . COLONOSCOPY  10/16/2008   NAT:FTDDUKGURKY, otherwise normal rectum; pancolonic diverticula the remainder of the colonic mucosa  appeared normal. Next TCS due 10/2013.  Marland Kitchen COLONOSCOPY  06/28/2006   RMR: Internal hemorrhoids. Diminutive rectal polyps, cold biopsied/ Pancolonic diverticula. Polyps in the right colon removed   . COLONOSCOPY WITH PROPOFOL N/A 05/20/2015   RMR: Pancolonic diverticulosis. Redundant colon   . ESOPHAGOGASTRODUODENOSCOPY  10/16/2008   HCW:CBJSE hiatal hernia otherwise normal esophagus, stomach  D1, D2, status post passage of a 56-French Maloney dilator  . ESOPHAGOGASTRODUODENOSCOPY  06/28/2006   GBT:DVVOHY esophagus. Small hiatal hernia. Otherwise normal stomach, D1 and D2, status post passage of a 56 [place] Maloney dilator  . ESOPHAGOGASTRODUODENOSCOPY (EGD) WITH ESOPHAGEAL DILATION N/A 07/14/2013   RMR: Abnormal esophagus of uncertain significance-status post esophageal biospy. small hiatal hernia.   Marland Kitchen ESOPHAGOGASTRODUODENOSCOPY (EGD) WITH PROPOFOL N/A 05/20/2015   RMR: NORMAL EGD status post maloney dilation   . ESOPHAGOGASTRODUODENOSCOPY (EGD) WITH PROPOFOL N/A 12/23/2018   Dr. Gala Romney: normal esophagus, s/p esophageal dilation.   Marland Kitchen FOOT SURGERY     left foot-  . INCISION AND DRAINAGE Right 01/03/2013   Procedure: INCISION AND DRAINAGE;  Surgeon: Carole Civil, MD;  Location: [facility];  Service: Orthopedics;  Laterality: Right;  . KNEE ARTHROSCOPY  01/2012   right  . KNEE ARTHROSCOPY WITH LATERAL RELEASE Right 01/03/2013   Procedure: Lateral Release Patella Right Knee;  Surgeon: Carole Civil, MD;  Location: [facility];  Service: Orthopedics;  Laterality: Right;  . left foot  x 2  . LUMBAR DISC SURGERY  L4-5  . MALONEY DILATION N/A 05/20/2015   Procedure: Venia Minks DILATION;  Surgeon: Daneil Dolin, MD;  Location: [facility];  Service: Endoscopy;  Laterality: N/A;  Maloney dilator # 6  . MALONEY DILATION N/A 12/23/2018   Procedure: Venia Minks DILATION;  Surgeon: Daneil Dolin, MD;  Location: [facility];  Service: Endoscopy;  Laterality: N/A;  . oophoectomy  bilateral  . PARTIAL HYSTERECTOMY    .  TOTAL KNEE ARTHROPLASTY  07/01/2012   Procedure: TOTAL KNEE ARTHROPLASTY;  Surgeon: Carole Civil, MD;  Location: [facility];  Service: Orthopedics;  Laterality: Right;  . TOTAL KNEE REVISION Right 01/03/2013   Procedure: Patellaplasty Right Knee;  Surgeon: Carole Civil, MD;  Location: [facility];  Service: Orthopedics;  Laterality: Right;    Family Psychiatric History: see below  Family History:  Family History  Problem Relation Age of Onset  . Alcohol abuse Mother   . Anxiety disorder Mother   . Alcohol abuse Father   . Anxiety disorder Father   . Bipolar disorder Sister   . Alcohol abuse Brother   . Anxiety disorder Brother   . Drug abuse Brother   . Dementia Paternal Aunt   . ADD / ADHD Grandchild   . ADD / ADHD Grandchild   . Drug abuse Brother   . Alcohol abuse Brother   . Anxiety disorder Brother   .  Seizures Brother   . Alcohol abuse Brother   . Anxiety disorder Brother   . Seizures Brother   . Alcohol abuse Brother   . Anxiety disorder Brother   . Alcohol abuse Brother   . Anxiety disorder Brother   . Heart disease Other   . Diabetes Other   . Alcohol abuse Other   . Depression Neg Hx   . OCD Neg Hx   . Paranoid behavior Neg Hx   . Schizophrenia Neg Hx   . Sexual abuse Neg Hx   . Physical abuse Neg Hx   . Colon cancer Neg Hx     Social History:  Social History   Socioeconomic History  . Marital status: Legally Separated    Spouse name: Not on file  . Number of children: Not on file  . Years of education: 12th grade  . Highest education level: Not on file  Occupational History  . Occupation: disabled    Associate Professor: UNEMPLOYED  Tobacco Use  . Smoking status: Former Smoker    Packs/day: 0.25    Years: 5.00    Pack years: 1.25    Types: Cigarettes    Quit date: 06/26/2003    Years since quitting: 16.3  . Smokeless tobacco: Never Used  Substance and Sexual Activity  . Alcohol use: Yes    Alcohol/week: 0.0 standard drinks    Comment:  occasionally; once a month.  . Drug use: No    Comment: has a past history of street drug use  . Sexual activity: Never    Birth control/protection: Surgical  Other Topics Concern  . Not on file  Social History Narrative  . Not on file   Social Determinants of Health   Financial Resource Strain:   . Difficulty of Paying Living Expenses: Not on file  Food Insecurity:   . Worried About Programme researcher, broadcasting/film/video in the Last Year: Not on file  . Ran Out of Food in the Last Year: Not on file  Transportation Needs:   . Lack of Transportation (Medical): Not on file  . Lack of Transportation (Non-Medical): Not on file  Physical Activity:   . Days of Exercise per Week: Not on file  . Minutes of Exercise per Session: Not on file  Stress:   . Feeling of Stress : Not on file  Social Connections:   . Frequency of Communication with Friends and Family: Not on file  . Frequency of Social Gatherings with Friends and Family: Not on file  . Attends Religious Services: Not on file  . Active Member of Clubs or Organizations: Not on file  . Attends Banker Meetings: Not on file  . Marital Status: Not on file    Allergies:   Metabolic Disorder Labs: Lab Results  Component Value Date   HGBA1C 5.8 (H) 01/25/2012   MPG 120 (H) 01/25/2012   No results found for: PROLACTIN Lab Results  Component Value Date   CHOL  12/10/2010    69        ATP III CLASSIFICATION:  <200     mg/dL   Desirable  027-253  mg/dL   Borderline High  >=664    mg/dL   High          TRIG 38 12/10/2010   HDL 28 (L) 12/10/2010   CHOLHDL 2.5 12/10/2010   VLDL 8 12/10/2010   LDLCALC  12/10/2010    33        Total Cholesterol/HDL:CHD Risk Coronary  Heart Disease Risk Table                     Men   Women  1/2 Average Risk   3.4   3.3  Average Risk       5.0   4.4  2 X Average Risk   9.6   7.1  3 X Average Risk  23.4   11.0        Use the calculated Patient Ratio above and the CHD Risk Table to determine  the patient's CHD Risk.        ATP III CLASSIFICATION (LDL):  <100     mg/dL   Optimal  245-809  mg/dL   Near or Above                    Optimal  130-159  mg/dL   Borderline  983-382  mg/dL   High  >505     mg/dL   Very High   LDLCALC 71 07/12/2009   Lab Results  Component Value Date   TSH 0.52 02/11/2018   TSH <0.008 (L) 12/09/2010    Therapeutic Level Labs: No results found for: LITHIUM Lab Results  Component Value Date   VALPROATE 70.7 09/24/2018   VALPROATE 71.5 01/30/2017   No components found for:  CBMZ  Current Medications: Current Outpatient Medications  Medication Sig Dispense Refill  . ALPRAZolam (XANAX) 0.5 MG tablet Take one twice a day and two at bedtime 120 tablet 2  . AMITIZA 24 MCG capsule TAKE ONE CAPSULE BY MOUTH TWICE DAILY. TAKE WITH A MEAL. 60 capsule 5  . amLODipine (NORVASC) 5 MG tablet Take 5 mg by mouth daily.    Marland Kitchen atorvastatin (LIPITOR) 20 MG tablet Take 20 mg by mouth daily.    . cyclobenzaprine (FLEXERIL) 10 MG tablet TAKE ONE TABLET BY MOUTH TWICE DAILY AS NEEDED FOR MUSCLE SPASMS. 40 tablet 1  . divalproex (DEPAKOTE) 250 MG DR tablet Take 3 tablets (750 mg total) by mouth at bedtime. 90 tablet 2  . FLUoxetine (PROZAC) 20 MG capsule Take 1 capsule (20 mg total) by mouth 2 (two) times daily. 60 capsule 2  . hydrocortisone (ANUSOL-HC) 2.5 % rectal cream Place 1 application rectally 2 (two) times daily. (Patient taking differently: Place 1 application rectally daily as needed for hemorrhoids. ) 30 g 1  . levothyroxine (SYNTHROID, LEVOTHROID) 150 MCG tablet Take 150 mcg by mouth daily before breakfast.   5  . lisinopril (PRINIVIL,ZESTRIL) 10 MG tablet Take 10 mg by mouth daily.    . meloxicam (MOBIC) 7.5 MG tablet TAKE ONE TABLET BY MOUTH DAILY 30 tablet 5  . oxyCODONE-acetaminophen (PERCOCET) 7.5-325 MG tablet Take 1 tablet by mouth every 4 (four) hours as needed for up to 7 days for severe pain. 42 tablet 0  . pantoprazole (PROTONIX) 40 MG tablet  Take 1 tablet (40 mg total) by mouth 2 (two) times daily. 60 tablet 5  . SYMPROIC 0.2 MG TABS TAKE ONE TABLET BY MOUTH DAILY FOR CONSTIPATION ON DAYS YOU TAKE YOUR PAIN MEDICATION. 30 tablet 3  . traZODone (DESYREL) 100 MG tablet Take 2 tablets (200 mg total) by mouth at bedtime. 30 tablet 2   No current facility-administered medications for this visit.     Musculoskeletal: Strength & Muscle Tone: within normal limits Gait & Station: normal Patient leans: N/A  Psychiatric Specialty Exam: Review of Systems  Genitourinary: Positive for pelvic pain.  Musculoskeletal: Positive for back  pain.  All other systems reviewed and are negative.   There were no vitals taken for this visit.There is no height or weight on file to calculate BMI.  General Appearance: NA  Eye Contact:  NA  Speech:  Clear and Coherent  Volume:  Decreased  Mood:  Anxious  Affect:  NA  Thought Process:  Goal Directed  Orientation:  Full (Time, Place, and Person)  Thought Content: Rumination   Suicidal Thoughts:  No  Homicidal Thoughts:  No  Memory:  Immediate;   Good Recent;   Good Remote;   Fair  Judgement:  Fair  Insight:  Fair  Psychomotor Activity:  Decreased  Concentration:  Concentration: Good and Attention Span: Good  Recall:  Good  Fund of Knowledge: Fair  Language: Good  Akathisia:  No  Handed:  Right  AIMS (if indicated): not done  Assets:  Communication Skills Desire for Improvement Resilience Social Support  ADL's:  Intact  Cognition: WNL  Sleep:  Good   Screenings:   Assessment and Plan: This patient is a 69 year old female with a history of depression anxiety chronic pain and a remote history of alcohol abuse she seems to be sleeping well and having less depression since we increased her medications last time.  She will continue trazodone 200 mg at bedtime for sleep and Prozac 20 mg twice daily for depression.  She will continue Xanax 0.5 mg twice daily and 1 mg at bedtime for anxiety  and Depakote 750 mg at bedtime for mood stabilization.  She will return to see me in 3 months   Diane Ruder, MD 10/29/2019, 2:22 PM

## 2019-11-04 ENCOUNTER — Other Ambulatory Visit: Payer: Self-pay | Admitting: Orthopedic Surgery

## 2019-11-04 DIAGNOSIS — G894 Chronic pain syndrome: Secondary | ICD-10-CM

## 2019-11-04 MED ORDER — OXYCODONE-ACETAMINOPHEN 7.5-325 MG PO TABS: 1.0000 | ORAL_TABLET | ORAL | 0 refills | Status: DC | PRN

## 2019-11-04 NOTE — Telephone Encounter (Signed)
Patient requests refill on Oxycodone/Acetaminophen 7.5-325  Mgs.  Qty  42  Sig: Take 1 tablet by mouth every 4 (four) hours as needed for up to 7 days for severe pain  Patient states she uses [facility] in [place]

## 2019-11-06 ENCOUNTER — Other Ambulatory Visit: Payer: Self-pay | Admitting: Orthopedic Surgery

## 2019-11-06 DIAGNOSIS — M25562 Pain in left knee: Secondary | ICD-10-CM

## 2019-11-06 DIAGNOSIS — S8002XA Contusion of left knee, initial encounter: Secondary | ICD-10-CM

## 2019-11-06 DIAGNOSIS — M1711 Unilateral primary osteoarthritis, right knee: Secondary | ICD-10-CM

## 2019-11-06 DIAGNOSIS — G894 Chronic pain syndrome: Secondary | ICD-10-CM

## 2019-11-07 ENCOUNTER — Other Ambulatory Visit: Payer: Self-pay

## 2019-11-07 NOTE — Patient Outreach (Signed)
[facility]) Care Management  11/07/2019  Diane [name] 08-02-1951 [id number]   Medication Adherence call to Diane [name] HIPPA Compliant Voice message left with a call back number. Diane [name] is showing past due on Atorvastatin 20 mg and Lisinopril 10 mg under [employer].   Lillia Abed CPhT Pharmacy Technician [facility] Management Direct Dial [phone]  Fax [phone] [email or web address]

## 2019-11-10 ENCOUNTER — Other Ambulatory Visit: Payer: Self-pay | Admitting: Obstetrics & Gynecology

## 2019-11-10 ENCOUNTER — Encounter (HOSPITAL_COMMUNITY): Payer: Self-pay

## 2019-11-10 ENCOUNTER — Other Ambulatory Visit (HOSPITAL_COMMUNITY)
Admission: RE | Admit: 2019-11-10 | Discharge: 2019-11-10 | Disposition: A | Discharge status: Home / Self Care | Payer: Medicare Other | Source: Ambulatory Visit | Attending: Obstetrics & Gynecology | Admitting: Obstetrics & Gynecology

## 2019-11-10 ENCOUNTER — Other Ambulatory Visit: Payer: Self-pay | Admitting: Orthopedic Surgery

## 2019-11-10 ENCOUNTER — Other Ambulatory Visit: Payer: Self-pay

## 2019-11-10 ENCOUNTER — Encounter (HOSPITAL_COMMUNITY)
Admission: RE | Admit: 2019-11-10 | Discharge: 2019-11-10 | Disposition: A | Discharge status: Home / Self Care | Payer: Medicare Other | Source: Ambulatory Visit | Attending: Obstetrics & Gynecology | Admitting: Obstetrics & Gynecology

## 2019-11-10 DIAGNOSIS — Z01812 Encounter for preprocedural laboratory examination: Secondary | ICD-10-CM | POA: Diagnosis not present

## 2019-11-10 DIAGNOSIS — G894 Chronic pain syndrome: Secondary | ICD-10-CM

## 2019-11-10 DIAGNOSIS — Z20822 Contact with and (suspected) exposure to covid-19: Secondary | ICD-10-CM | POA: Diagnosis not present

## 2019-11-10 LAB — COMPREHENSIVE METABOLIC PANEL
ALT: 9 U/L (ref 0–44)
AST: 10 U/L — ABNORMAL LOW (ref 15–41)
Albumin: 3.4 g/dL — ABNORMAL LOW (ref 3.5–5.0)
Alkaline Phosphatase: 40 U/L (ref 38–126)
Anion gap: 6 (ref 5–15)
BUN: 16 mg/dL (ref 8–23)
CO2: 31 mmol/L (ref 22–32)
Calcium: 8.4 mg/dL — ABNORMAL LOW (ref 8.9–10.3)
Chloride: 103 mmol/L (ref 98–111)
Creatinine, Ser: 0.82 mg/dL (ref 0.44–1.00)
GFR calc Af Amer: >60 mL/min (ref >60)
GFR calc non Af Amer: >60 mL/min (ref >60)
Glucose, Bld: 95 mg/dL (ref 70–99)
Potassium: 3.7 mmol/L (ref 3.5–5.1)
Sodium: 140 mmol/L (ref 135–145)
Total Bilirubin: 0.6 mg/dL (ref 0.3–1.2)
Total Protein: 7 g/dL (ref 6.5–8.1)

## 2019-11-10 LAB — CBC
HCT: 36.7 % (ref 36.0–46.0)
Hemoglobin: 11.5 g/dL — ABNORMAL LOW (ref 12.0–15.0)
MCH: 29.4 pg (ref 26.0–34.0)
MCHC: 31.3 g/dL (ref 30.0–36.0)
MCV: 93.9 fL (ref 80.0–100.0)
Platelets: 184 10*3/uL (ref 150–400)
RBC: 3.91 MIL/uL (ref 3.87–5.11)
RDW: 13.1 % (ref 11.5–15.5)
WBC: 5.2 10*3/uL (ref 4.0–10.5)
nRBC: 0 % (ref 0.0–0.2)

## 2019-11-10 LAB — URINALYSIS, ROUTINE W REFLEX MICROSCOPIC
Glucose, UA: NEGATIVE mg/dL
Ketones, ur: NEGATIVE mg/dL
Nitrite: NEGATIVE
Protein, ur: 30 mg/dL — AB
Specific Gravity, Urine: 1.029 (ref 1.005–1.030)
pH: 5 (ref 5.0–8.0)

## 2019-11-10 LAB — SARS CORONAVIRUS 2 (TAT 6-24 HRS): SARS Coronavirus 2: NEGATIVE

## 2019-11-10 LAB — TYPE AND SCREEN
ABO/RH(D): O POS
Antibody Screen: NEGATIVE

## 2019-11-10 LAB — RAPID HIV SCREEN (HIV 1/2 AB+AG)
HIV 1/2 Antibodies: NONREACTIVE
HIV-1 P24 Antigen - HIV24: NONREACTIVE

## 2019-11-10 NOTE — Patient Instructions (Addendum)
Your procedure is scheduled on: 11/12/2019  Report to [facility] at 10:00     AM.  Call this number if you have problems the morning of surgery: [phone]   Remember:   Do not Eat or Drink after midnight         No Smoking the morning of surgery  :  Take these medicines the morning of surgery with A SIP OF WATER: Levothyroxine, Depakote, Prozac, Meloxicam, Protonix and oxycodone if needed   Do not wear jewelry, make-up or nail polish.  Do not wear lotions, powders, or perfumes. You may wear deodorant.  Do not shave 48 hours prior to surgery. Men may shave face and neck.  Do not bring valuables to the hospital.  Contacts, dentures or bridgework may not be worn into surgery.  Leave suitcase in the car. After surgery it may be brought to your room.  For patients admitted to the hospital, checkout time is 11:00 AM the day of discharge.   Patients discharged the day of surgery will not be allowed to drive home.    Special Instructions: Shower using CHG night before surgery and shower the day of surgery use CHG.  Use special wash - you have one bottle of CHG for all showers.  You should use approximately 1/2 of the bottle for each shower.  Unilateral Salpingo-Oophorectomy Unilateral salpingo-oophorectomy is the surgical removal of one fallopian tube and one ovary. The ovaries are the female reproductive organs that produce eggs. The fallopian tubes allow eggs to move from the ovaries to the uterus. You may need this procedure if you have:  An infection in the fallopian tube and ovary.  Scar tissue (adhesions) in the fallopian tube and ovary.  A cyst or tumor on the ovary.  Your uterus removed.  Cancer of the fallopian tube or ovary. There are three techniques that can be used for this procedure:  Open. One large incision will be made in your abdomen.  Laparoscopic. A thin, lighted tube with a camera (laparoscope) will be used to perform the procedure. The laparoscope will allow  your surgeon to make several small incisions in the abdomen instead of one large incision.  Robot-assisted. A computer will be used to control surgical instruments that are attached to robotic arms. A laparoscope may also be used with this technique. This procedure:  Will not stop you from becoming pregnant.  Will not cause menopause.  Will not cause problems with your menstrual periods.  Will not affect your sex drive. Tell a health care provider about:  Any allergies you have.  All medicines you are taking, including vitamins, herbs, eye drops, creams, and over-the-counter medicines.  Any problems you or family members have had with anesthetic medicines.  Any blood disorders you have.  Any surgeries you have had.  Any medical conditions you have.  Whether you are pregnant or may be pregnant. What are the risks? Generally, this is a safe procedure. However, problems may occur, including:  Infection.  Bleeding.  Allergic reactions to medicines.  Damage to other structures or organs.  Blood clots in the legs or lungs. What happens before the procedure? Staying hydrated Follow instructions from your health care provider about hydration, which may include:  Up to 2 hours before the procedure - you may continue to drink clear liquids, such as water, clear fruit juice, black coffee, and plain tea. Eating and drinking restrictions Follow instructions from your health care provider about eating and drinking, which may include:  8 hours before the procedure - stop eating heavy meals or foods such as meat, fried foods, or fatty foods.  6 hours before the procedure - stop eating light meals or foods, such as toast or cereal.  6 hours before the procedure - stop drinking milk or drinks that contain milk.  2 hours before the procedure - stop drinking clear liquids. Medicines  Ask your health care provider about: ? Changing or stopping your regular medicines. This is  especially important if you are taking diabetes medicines or blood thinners. ? Taking over-the-counter medicines, vitamins, herbs, and supplements. ? Taking medicines such as aspirin and ibuprofen. These medicines can thin your blood. Do not take these medicines unless your health care provider tells you to take them.  You may be given antibiotic medicine to help prevent infection. General instructions  Do not smoke for at least 2 weeks before your procedure, or as told by your health care provider.  You may have an exam or testing.  You may have a blood or urine sample taken.  Ask your health care provider how your surgical site will be marked or identified.  Plan to have someone take you home from the hospital or clinic.  If you will be going home right after the procedure, plan to have someone with you for 24 hours. What happens during the procedure?  To reduce your risk of infection: ? Your health care team will wash or sanitize their hands. ? Hair may be removed from the surgical area. ? Your skin will be washed with soap.  An IV will be inserted into one of your veins.  You will be given one or both of the following: ? A medicine to help you relax (sedative). ? A medicine to make you fall asleep (general anesthetic).  A small, thin tube (catheter) will be inserted through your urethra and into your bladder. The catheter will drain urine during the procedure.  Depending on the type of surgery you are having, your surgeon will do one of the following: ? Make one incision in your abdomen (open surgery). ? Make two small incisions in your abdomen (laparoscopic surgery). The laparoscope will be passed through one incision, and surgical instruments will be passed through the other. ? Make several small incisions in your abdomen (robot-assisted surgery). A laparoscope and other surgical instruments may be passed through the incisions.  Your fallopian tube and ovary will be cut  away from the uterus and removed.  Your blood vessels will be clamped and tied to prevent too much bleeding.  The incisions in your abdomen will be closed with stitches (sutures) or staples.  A bandage (dressing) may be placed over your incisions. The procedure may vary among health care providers and hospitals. What happens after the procedure?  Your blood pressure, heart rate, breathing rate, and blood oxygen level will be monitored until the medicines you were given have worn off.  You may continue to receive fluids and medicines through an IV.  You may continue to have a catheter draining your urine.  You may have to wear compression stockings. These stockings help to prevent blood clots and reduce swelling in your legs.  You will be given pain medicine as needed.  Do not drive for 24 hours if you received a sedative. Summary  Unilateral salpingo-oophorectomy is the surgical removal of one fallopian tube and one ovary.  There are three techniques that can be used for this procedure: open, laparoscopic, and robotic. Ask your  health care provider which procedure will be used in your case.  This procedure will not stop you from becoming pregnant, or cause problems with your menstrual periods or sex drive. This information is not intended to replace advice given to you by your health care provider. Make sure you discuss any questions you have with your health care provider. Document Revised: 08/10/2017 Document Reviewed: 12/07/2016 Elsevier Patient Education  [place].  Unilateral Salpingo-Oophorectomy, Care After This sheet gives you information about how to care for yourself after your procedure. Your health care provider may also give you more specific instructions. If you have problems or questions, contact your health care provider. What can I expect after the procedure? After the procedure, it is common to have:  Abdominal pain.  Some occasional vaginal bleeding  (spotting).  Tiredness. Follow these instructions at home: Incision care   Keep your incision area and your bandage (dressing) clean and dry.  Follow instructions from your health care provider about how to take care of your incision. Make sure you: ? Wash your hands with soap and water before you change your dressing. If soap and water are not available, use hand sanitizer. ? Change your dressing as told by your health care provider. ? Leave stitches (sutures), staples, skin glue, or adhesive strips in place. These skin closures may need to stay in place for 2 weeks or longer. If adhesive strip edges start to loosen and curl up, you may trim the loose edges. Do not remove adhesive strips completely unless your health care provider tells you to do that.  Check your incision area every day for signs of infection. Check for: ? Redness, swelling, or pain. ? Fluid or blood. ? Warmth. ? Pus or a bad smell. Activity  Do not drive or use heavy machinery while taking prescription pain medicine.  Do not drive for 24 hours if you received a medicine to help you relax (sedative).  Take frequent, short walks throughout the day. Rest when you get tired. Ask your health care provider what activities are safe for you.  Avoid activities that require great effort. Also, avoid heavy lifting. Do not lift anything that is heavier than 5 lb (2.3 kg), or the limit that your health care provider tells you, until he or she says that it is safe to do so.  Do not douche, use tampons, or have sex until your health care provider approves. General instructions  To prevent or treat constipation while you are taking prescription pain medicine, your health care provider may recommend that you: ? Drink enough fluid to keep your urine pale yellow. ? Take over-the-counter or prescription medicines. ? Eat foods that are high in fiber, such as fresh fruits and vegetables, whole grains, and beans. ? Limit foods that  are high in fat and processed sugars, such as fried and sweet foods.  Take over-the-counter and prescription medicines only as told by your health care provider.  Do not take baths, swim, or use a hot tub until your health care provider approves. Ask your health care provider if you may take showers. You may only be allowed to take sponge baths.  Wear compression stockings as told by your health care provider. These stockings help to prevent blood clots and reduce swelling in your legs.  Keep all follow-up visits as told by your health care provider. This is important. Contact a health care provider if:  You have pain when you urinate.  You have pus or a  bad smelling discharge coming from your vagina.  You have redness, swelling, or pain around your incision.  You have fluid or blood coming from your incision.  Your incision feels warm to the touch.  You have pus or a bad smell coming from your incision.  You have a fever.  Your incision starts to break open.  You have abdominal pain that gets worse or does not get better with medicine.  You develop a rash.  You develop nausea and vomiting.  You feel lightheaded. Get help right away if:  You develop pain in your chest or leg.  You develop shortness of breath.  You faint.  You have increased bleeding from your vagina. Summary  After the procedure, it is common to have pain, tiredness, and occasional bleeding from the vagina.  Follow instructions from your health care provider about how to take care of your incision.  Check your incision every day for signs of infection and report any symptoms to your health care provider.  Follow instructions from your health care provider about activities and restrictions. This information is not intended to replace advice given to you by your health care provider. Make sure you discuss any questions you have with your health care provider. Document Revised: 08/10/2017 Document  Reviewed: 12/07/2016 Elsevier Patient Education  2020 [employer]  General Anesthesia, Adult, Care After This sheet gives you information about how to care for yourself after your procedure. Your health care provider may also give you more specific instructions. If you have problems or questions, contact your health care provider. What can I expect after the procedure? After the procedure, the following side effects are common:  Pain or discomfort at the IV site.  Nausea.  Vomiting.  Sore throat.  Trouble concentrating.  Feeling cold or chills.  Weak or tired.  Sleepiness and fatigue.  Soreness and body aches. These side effects can affect parts of the body that were not involved in surgery. Follow these instructions at home:  For at least 24 hours after the procedure:  Have a responsible adult stay with you. It is important to have someone help care for you until you are awake and alert.  Rest as needed.  Do not: ? Participate in activities in which you could fall or become injured. ? Drive. ? Use heavy machinery. ? Drink alcohol. ? Take sleeping pills or medicines that cause drowsiness. ? Make important decisions or sign legal documents. ? Take care of children on your own. Eating and drinking  Follow any instructions from your health care provider about eating or drinking restrictions.  When you feel hungry, start by eating small amounts of foods that are soft and easy to digest (bland), such as toast. Gradually return to your regular diet.  Drink enough fluid to keep your urine pale yellow.  If you vomit, rehydrate by drinking water, juice, or clear broth. General instructions  If you have sleep apnea, surgery and certain medicines can increase your risk for breathing problems. Follow instructions from your health care provider about wearing your sleep device: ? Anytime you are sleeping, including during daytime naps. ? While taking prescription pain  medicines, sleeping medicines, or medicines that make you drowsy.  Return to your normal activities as told by your health care provider. Ask your health care provider what activities are safe for you.  Take over-the-counter and prescription medicines only as told by your health care provider.  If you smoke, do not smoke without supervision.  Keep  all follow-up visits as told by your health care provider. This is important. Contact a health care provider if:  You have nausea or vomiting that does not get better with medicine.  You cannot eat or drink without vomiting.  You have pain that does not get better with medicine.  You are unable to pass urine.  You develop a skin rash.  You have a fever.  You have redness around your IV site that gets worse. Get help right away if:  You have difficulty breathing.  You have chest pain.  You have blood in your urine or stool, or you vomit blood. Summary  After the procedure, it is common to have a sore throat or nausea. It is also common to feel tired.  Have a responsible adult stay with you for the first 24 hours after general anesthesia. It is important to have someone help care for you until you are awake and alert.  When you feel hungry, start by eating small amounts of foods that are soft and easy to digest (bland), such as toast. Gradually return to your regular diet.  Drink enough fluid to keep your urine pale yellow.  Return to your normal activities as told by your health care provider. Ask your health care provider what activities are safe for you. This information is not intended to replace advice given to you by your health care provider. Make sure you discuss any questions you have with your health care provider. Document Revised: 08/31/2017 Document Reviewed: 04/13/2017 Elsevier Patient Education  2020 [employer].

## 2019-11-10 NOTE — Telephone Encounter (Signed)
Patient called requested refill on Oxycodone/Acetaminophen 7.5-325  Mgs.  Qty  42  Sig: Take 1 tablet by mouth every 4 (four) hours as needed for up to 7 days for severe pain.  Patient states she uses [facility] in [place]

## 2019-11-11 MED ORDER — OXYCODONE-ACETAMINOPHEN 7.5-325 MG PO TABS: 1.0000 | ORAL_TABLET | ORAL | 0 refills | Status: DC | PRN

## 2019-11-12 ENCOUNTER — Encounter (HOSPITAL_COMMUNITY)
Admission: RE | Disposition: A | Discharge status: Home / Self Care | Payer: Self-pay | Source: Ambulatory Visit | Attending: Obstetrics & Gynecology

## 2019-11-12 ENCOUNTER — Ambulatory Visit (HOSPITAL_COMMUNITY)
Admission: RE | Admit: 2019-11-12 | Discharge: 2019-11-12 | Disposition: A | Discharge status: Home / Self Care | Payer: Medicare Other | Source: Ambulatory Visit | Attending: Obstetrics & Gynecology | Admitting: Obstetrics & Gynecology

## 2019-11-12 ENCOUNTER — Ambulatory Visit (HOSPITAL_COMMUNITY): Payer: Medicare Other | Admitting: Anesthesiology

## 2019-11-12 ENCOUNTER — Encounter: Payer: Self-pay | Admitting: Internal Medicine

## 2019-11-12 ENCOUNTER — Other Ambulatory Visit: Payer: Self-pay | Admitting: Obstetrics & Gynecology

## 2019-11-12 ENCOUNTER — Encounter (HOSPITAL_COMMUNITY): Payer: Self-pay | Admitting: Obstetrics & Gynecology

## 2019-11-12 DIAGNOSIS — K219 Gastro-esophageal reflux disease without esophagitis: Secondary | ICD-10-CM | POA: Diagnosis not present

## 2019-11-12 DIAGNOSIS — G4730 Sleep apnea, unspecified: Secondary | ICD-10-CM | POA: Diagnosis not present

## 2019-11-12 DIAGNOSIS — G894 Chronic pain syndrome: Secondary | ICD-10-CM | POA: Diagnosis not present

## 2019-11-12 DIAGNOSIS — F329 Major depressive disorder, single episode, unspecified: Secondary | ICD-10-CM | POA: Insufficient documentation

## 2019-11-12 DIAGNOSIS — Z87891 Personal history of nicotine dependence: Secondary | ICD-10-CM | POA: Diagnosis not present

## 2019-11-12 DIAGNOSIS — N736 Female pelvic peritoneal adhesions (postinfective): Secondary | ICD-10-CM

## 2019-11-12 DIAGNOSIS — Z79899 Other long term (current) drug therapy: Secondary | ICD-10-CM | POA: Diagnosis not present

## 2019-11-12 DIAGNOSIS — I10 Essential (primary) hypertension: Secondary | ICD-10-CM | POA: Insufficient documentation

## 2019-11-12 DIAGNOSIS — E7800 Pure hypercholesterolemia, unspecified: Secondary | ICD-10-CM | POA: Diagnosis not present

## 2019-11-12 DIAGNOSIS — E039 Hypothyroidism, unspecified: Secondary | ICD-10-CM | POA: Diagnosis not present

## 2019-11-12 DIAGNOSIS — K660 Peritoneal adhesions (postprocedural) (postinfection): Secondary | ICD-10-CM

## 2019-11-12 DIAGNOSIS — K668 Other specified disorders of peritoneum: Secondary | ICD-10-CM

## 2019-11-12 DIAGNOSIS — E785 Hyperlipidemia, unspecified: Secondary | ICD-10-CM | POA: Insufficient documentation

## 2019-11-12 DIAGNOSIS — F419 Anxiety disorder, unspecified: Secondary | ICD-10-CM | POA: Insufficient documentation

## 2019-11-12 DIAGNOSIS — F4310 Post-traumatic stress disorder, unspecified: Secondary | ICD-10-CM | POA: Diagnosis not present

## 2019-11-12 DIAGNOSIS — Z791 Long term (current) use of non-steroidal anti-inflammatories (NSAID): Secondary | ICD-10-CM | POA: Diagnosis not present

## 2019-11-12 HISTORY — PX: LAPAROSCOPIC LYSIS OF ADHESIONS: SHX5905

## 2019-11-12 SURGERY — LYSIS, ADHESIONS, LAPAROSCOPIC
Anesthesia: General | Site: Vagina

## 2019-11-12 MED ORDER — KETAMINE HCL 50 MG/5ML IJ SOSY
PREFILLED_SYRINGE | INTRAMUSCULAR | Status: AC
  Filled 2019-11-12: qty 5

## 2019-11-12 MED ORDER — EPHEDRINE 5 MG/ML INJ
INTRAVENOUS | Status: AC
  Filled 2019-11-12: qty 10

## 2019-11-12 MED ORDER — CLINDAMYCIN PHOSPHATE 900 MG/50ML IV SOLN
900.0000 mg | INTRAVENOUS | Status: AC
  Administered 2019-11-12: 900 mg via INTRAVENOUS
  Filled 2019-11-12: qty 50

## 2019-11-12 MED ORDER — PHENYLEPHRINE 40 MCG/ML (10ML) SYRINGE FOR IV PUSH (FOR BLOOD PRESSURE SUPPORT)
PREFILLED_SYRINGE | INTRAVENOUS | Status: AC
  Filled 2019-11-12: qty 10

## 2019-11-12 MED ORDER — PROPOFOL 10 MG/ML IV BOLUS
INTRAVENOUS | Status: DC | PRN
  Administered 2019-11-12: 150 mg via INTRAVENOUS

## 2019-11-12 MED ORDER — SUGAMMADEX SODIUM 200 MG/2ML IV SOLN: INTRAVENOUS | Status: DC | PRN

## 2019-11-12 MED ORDER — HYDROMORPHONE HCL 1 MG/ML IJ SOLN: 0.2500 mg | INTRAMUSCULAR | Status: DC | PRN

## 2019-11-12 MED ORDER — ONDANSETRON HCL 4 MG/2ML IJ SOLN
INTRAMUSCULAR | Status: AC
  Filled 2019-11-12: qty 2

## 2019-11-12 MED ORDER — ROCURONIUM BROMIDE 10 MG/ML (PF) SYRINGE
PREFILLED_SYRINGE | INTRAVENOUS | Status: AC
  Filled 2019-11-12: qty 10

## 2019-11-12 MED ORDER — SODIUM CHLORIDE 0.9 % IR SOLN
Status: DC | PRN
  Administered 2019-11-12: 3000 mL

## 2019-11-12 MED ORDER — PROMETHAZINE HCL 25 MG/ML IJ SOLN: 6.2500 mg | INTRAMUSCULAR | Status: DC | PRN

## 2019-11-12 MED ORDER — SUGAMMADEX SODIUM 200 MG/2ML IV SOLN
INTRAVENOUS | Status: DC | PRN
  Administered 2019-11-12: 300 mg via INTRAVENOUS

## 2019-11-12 MED ORDER — KETOROLAC TROMETHAMINE 10 MG PO TABS: 10.0000 mg | ORAL_TABLET | Freq: Three times a day (TID) | ORAL | 0 refills | Status: DC | PRN

## 2019-11-12 MED ORDER — BUPIVACAINE LIPOSOME 1.3 % IJ SUSP
20.0000 mL | Freq: Once | INTRAMUSCULAR | Status: DC
  Filled 2019-11-12: qty 20

## 2019-11-12 MED ORDER — LIDOCAINE HCL (CARDIAC) PF 50 MG/5ML IV SOSY
PREFILLED_SYRINGE | INTRAVENOUS | Status: DC | PRN
  Administered 2019-11-12: 60 mg via INTRAVENOUS

## 2019-11-12 MED ORDER — FENTANYL CITRATE (PF) 250 MCG/5ML IJ SOLN
INTRAMUSCULAR | Status: AC
  Filled 2019-11-12: qty 5

## 2019-11-12 MED ORDER — SUCCINYLCHOLINE 20MG/ML (10ML) SYRINGE FOR MEDFUSION PUMP - OPTIME
INTRAMUSCULAR | Status: DC | PRN
  Administered 2019-11-12: 110 mg via INTRAVENOUS

## 2019-11-12 MED ORDER — PHENYLEPHRINE HCL (PRESSORS) 10 MG/ML IV SOLN
INTRAVENOUS | Status: DC | PRN
  Administered 2019-11-12 (×4): 40 ug via INTRAVENOUS

## 2019-11-12 MED ORDER — GLYCOPYRROLATE PF 0.2 MG/ML IJ SOSY
PREFILLED_SYRINGE | INTRAMUSCULAR | Status: AC
  Filled 2019-11-12: qty 1

## 2019-11-12 MED ORDER — OXYCODONE-ACETAMINOPHEN 7.5-325 MG PO TABS: 1.0000 | ORAL_TABLET | ORAL | 0 refills | Status: DC | PRN

## 2019-11-12 MED ORDER — FENTANYL CITRATE (PF) 100 MCG/2ML IJ SOLN
INTRAMUSCULAR | Status: DC | PRN
  Administered 2019-11-12 (×2): 50 ug via INTRAVENOUS

## 2019-11-12 MED ORDER — OXYCODONE-ACETAMINOPHEN 7.5-325 MG PO TABS: 1.0000 | ORAL_TABLET | Freq: Four times a day (QID) | ORAL | 0 refills | Status: DC | PRN

## 2019-11-12 MED ORDER — BUPIVACAINE LIPOSOME 1.3 % IJ SUSP
INTRAMUSCULAR | Status: AC
  Filled 2019-11-12: qty 20

## 2019-11-12 MED ORDER — MEPERIDINE HCL 50 MG/ML IJ SOLN: 6.2500 mg | INTRAMUSCULAR | Status: DC | PRN

## 2019-11-12 MED ORDER — ONDANSETRON 8 MG PO TBDP: 8.0000 mg | ORAL_TABLET | Freq: Three times a day (TID) | ORAL | 0 refills | Status: DC | PRN

## 2019-11-12 MED ORDER — ONDANSETRON HCL 4 MG/2ML IJ SOLN
INTRAMUSCULAR | Status: DC | PRN
  Administered 2019-11-12: 4 mg via INTRAVENOUS

## 2019-11-12 MED ORDER — LIDOCAINE 2% (20 MG/ML) 5 ML SYRINGE
INTRAMUSCULAR | Status: AC
  Filled 2019-11-12: qty 10

## 2019-11-12 MED ORDER — SODIUM CHLORIDE 0.9 % IR SOLN
Status: DC | PRN
  Administered 2019-11-12: 1000 mL

## 2019-11-12 MED ORDER — LIDOCAINE 2% (20 MG/ML) 5 ML SYRINGE
INTRAMUSCULAR | Status: AC
  Filled 2019-11-12: qty 5

## 2019-11-12 MED ORDER — MIDAZOLAM HCL 2 MG/2ML IJ SOLN: 2.0000 mg | Freq: Once | INTRAMUSCULAR | Status: DC

## 2019-11-12 MED ORDER — KETAMINE HCL 10 MG/ML IJ SOLN
INTRAMUSCULAR | Status: DC | PRN
  Administered 2019-11-12: 20 mg via INTRAVENOUS

## 2019-11-12 MED ORDER — KETOROLAC TROMETHAMINE 30 MG/ML IJ SOLN
30.0000 mg | Freq: Once | INTRAMUSCULAR | Status: AC
  Administered 2019-11-12: 30 mg via INTRAVENOUS
  Filled 2019-11-12: qty 1

## 2019-11-12 MED ORDER — BUPIVACAINE LIPOSOME 1.3 % IJ SUSP
INTRAMUSCULAR | Status: DC | PRN
  Administered 2019-11-12: 20 mL

## 2019-11-12 MED ORDER — GLYCOPYRROLATE 0.2 MG/ML IJ SOLN
INTRAMUSCULAR | Status: DC | PRN
  Administered 2019-11-12 (×2): .1 mg via INTRAVENOUS

## 2019-11-12 MED ORDER — LACTATED RINGERS IV SOLN
Freq: Once | INTRAVENOUS | Status: AC
  Administered 2019-11-12: 1000 mL via INTRAVENOUS

## 2019-11-12 MED ORDER — GENTAMICIN SULFATE 40 MG/ML IJ SOLN
5.0000 mg/kg | INTRAVENOUS | Status: AC
  Administered 2019-11-12: 490 mg via INTRAVENOUS
  Filled 2019-11-12: qty 12.25

## 2019-11-12 MED ORDER — LACTATED RINGERS IV SOLN: INTRAVENOUS | Status: DC | PRN

## 2019-11-12 MED ORDER — EPHEDRINE SULFATE 50 MG/ML IJ SOLN
INTRAMUSCULAR | Status: DC | PRN
  Administered 2019-11-12 (×2): 5 mg via INTRAVENOUS
  Administered 2019-11-12: 10 mg via INTRAVENOUS

## 2019-11-12 MED ORDER — ROCURONIUM 10MG/ML (10ML) SYRINGE FOR MEDFUSION PUMP - OPTIME
INTRAVENOUS | Status: DC | PRN
  Administered 2019-11-12 (×2): 10 mg via INTRAVENOUS
  Administered 2019-11-12: 25 mg via INTRAVENOUS
  Administered 2019-11-12: 10 mg via INTRAVENOUS

## 2019-11-12 SURGICAL SUPPLY — 51 items
BAG HAMPER (MISCELLANEOUS) ×3 IMPLANT
BLADE SURG SZ11 CARB STEEL (BLADE) ×3 IMPLANT
CLOTH BEACON ORANGE TIMEOUT ST (SAFETY) ×3 IMPLANT
COVER LIGHT HANDLE STERIS (MISCELLANEOUS) ×6 IMPLANT
COVER WAND RF STERILE (DRAPES) ×3 IMPLANT
DERMABOND ADVANCED (GAUZE/BANDAGES/DRESSINGS) ×1
DERMABOND ADVANCED .7 DNX12 (GAUZE/BANDAGES/DRESSINGS) ×2 IMPLANT
ELECT REM PT RETURN 9FT ADLT (ELECTROSURGICAL) ×3
ELECTRODE REM PT RTRN 9FT ADLT (ELECTROSURGICAL) ×2 IMPLANT
GLOVE BIO SURGEON STRL SZ7 (GLOVE) ×3 IMPLANT
GLOVE BIOGEL PI IND STRL 7.0 (GLOVE) ×8 IMPLANT
GLOVE BIOGEL PI IND STRL 8 (GLOVE) ×2 IMPLANT
GLOVE BIOGEL PI INDICATOR 7.0 (GLOVE) ×4
GLOVE BIOGEL PI INDICATOR 8 (GLOVE) ×1
GLOVE ECLIPSE 6.5 STRL STRAW (GLOVE) ×3 IMPLANT
GLOVE ECLIPSE 8.0 STRL XLNG CF (GLOVE) ×3 IMPLANT
GOWN STRL REUS W/TWL LRG LVL3 (GOWN DISPOSABLE) ×9 IMPLANT
GOWN STRL REUS W/TWL XL LVL3 (GOWN DISPOSABLE) ×3 IMPLANT
INST SET LAPROSCOPIC GYN AP (KITS) ×3 IMPLANT
IV NS IRRIG 3000ML ARTHROMATIC (IV SOLUTION) ×3 IMPLANT
KIT TURNOVER KIT A (KITS) ×3 IMPLANT
MANIFOLD NEPTUNE II (INSTRUMENTS) ×3 IMPLANT
NEEDLE HYPO 18GX1.5 BLUNT FILL (NEEDLE) ×3 IMPLANT
NEEDLE HYPO 22GX1.5 SAFETY (NEEDLE) ×3 IMPLANT
NEEDLE INSUFFLATION 120MM (ENDOMECHANICALS) ×3 IMPLANT
NEEDLE SPNL 22GX3.5 QUINCKE BK (NEEDLE) ×3 IMPLANT
NS IRRIG 1000ML POUR BTL (IV SOLUTION) ×3 IMPLANT
PACK PERI GYN (CUSTOM PROCEDURE TRAY) ×3 IMPLANT
PAD ARMBOARD 7.5X6 YLW CONV (MISCELLANEOUS) ×3 IMPLANT
SET BASIN LINEN APH (SET/KITS/TRAYS/PACK) ×3 IMPLANT
SET TUBE IRRIG SUCTION NO TIP (IRRIGATION / IRRIGATOR) ×3 IMPLANT
SET TUBE SMOKE EVAC HIGH FLOW (TUBING) ×3 IMPLANT
SHEARS HARMONIC ACE PLUS 36CM (ENDOMECHANICALS) ×3 IMPLANT
SLEEVE ENDOPATH XCEL 5M (ENDOMECHANICALS) ×3 IMPLANT
SOL ANTI FOG 6CC (MISCELLANEOUS) ×2 IMPLANT
SOLUTION ANTI FOG 6CC (MISCELLANEOUS) ×1
SPONGE GAUZE 2X2 8PLY STRL LF (GAUZE/BANDAGES/DRESSINGS) ×3 IMPLANT
STAPLER VISISTAT 35W (STAPLE) ×3 IMPLANT
SUT MNCRL AB 4-0 PS2 18 (SUTURE) ×3 IMPLANT
SUT VICRYL 0 UR6 27IN ABS (SUTURE) ×3 IMPLANT
SYR 10ML LL (SYRINGE) ×3 IMPLANT
SYR 20ML LL LF (SYRINGE) ×6 IMPLANT
SYS BAG RETRIEVAL 10MM (BASKET)
SYSTEM BAG RETRIEVAL 10MM (BASKET) IMPLANT
TAPE PAPER 3X10 WHT MICROPORE (GAUZE/BANDAGES/DRESSINGS) ×3 IMPLANT
TRAY FOLEY W/BAG SLVR 16FR (SET/KITS/TRAYS/PACK) ×3
TRAY FOLEY W/BAG SLVR 16FR ST (SET/KITS/TRAYS/PACK) ×2 IMPLANT
TROCAR ENDO BLADELESS 11MM (ENDOMECHANICALS) ×3 IMPLANT
TROCAR XCEL NON-BLD 5MMX100MML (ENDOMECHANICALS) ×3 IMPLANT
TUBING EVAC SMOKE HEATED PNEUM (TUBING) ×3 IMPLANT
WARMER LAPAROSCOPE (MISCELLANEOUS) ×3 IMPLANT

## 2019-11-12 NOTE — Anesthesia Preprocedure Evaluation (Addendum)
Anesthesia Evaluation  Patient identified by MRN, date of birth, ID band Patient awake    Reviewed: Allergy & Precautions, NPO status , Patient's Chart, lab work & pertinent test results  Airway Mallampati: III  TM Distance: >3 FB Neck ROM: Full    Dental  (+) Upper Dentures, Loose, Missing, Dental Advisory Given,    Pulmonary sleep apnea , former smoker,    Pulmonary exam normal breath sounds clear to auscultation       Cardiovascular Exercise Tolerance: Poor METShypertension, Pt. on medications Normal cardiovascular exam Rhythm:Regular Rate:Normal  28-Oct-2018 19:46:38 [facility]-ER ROUTINE RECORD Sinus rhythm Low voltage, precordial leads Borderline T abnormalities, lateral leads Prolonged QT interval When compared with ECG of 05/19/2015 QT has lengthened Otherwise no significant change Confirmed by Samuel Jester [phone]) on 10/28/2018   EKG -11/12/19 - NSR, Normal QT Interval, nonspecific T   Neuro/Psych  Headaches, Seizures -, Well Controlled,  PSYCHIATRIC DISORDERS Anxiety Depression Bipolar Disorder  Neuromuscular disease (last seizure - 20 years ago)    GI/Hepatic GERD  Medicated and Controlled,(+)     substance abuse  ,   Endo/Other  Hypothyroidism   Renal/GU negative Renal ROS  negative genitourinary   Musculoskeletal  (+) Arthritis  (Back pain), narcotic dependent  Abdominal   Peds negative pediatric ROS (+)  Hematology   Anesthesia Other Findings   Reproductive/Obstetrics negative OB ROS                          Anesthesia Physical Anesthesia Plan  ASA: III  Anesthesia Plan: General   Post-op Pain Management:    Induction: Intravenous  PONV Risk Score and Plan: 4 or greater and Dexamethasone and Ondansetron  Airway Management Planned: Oral ETT  Additional Equipment:   Intra-op Plan:   Post-operative Plan: Extubation in OR  Informed  Consent: I have reviewed the patients History and Physical, chart, labs and discussed the procedure including the risks, benefits and alternatives for the proposed anesthesia with the patient or authorized representative who has indicated his/her understanding and acceptance.     Dental advisory given  Plan Discussed with: CRNA and Surgeon  Anesthesia Plan Comments:         Anesthesia Quick Evaluation

## 2019-11-12 NOTE — H&P (Signed)
Preoperative History and Physical  Diane [name] is a 69 y.o. [id number] with No LMP recorded. Patient has had a hysterectomy.   Diane [name] is a 69 y.o. [id number] with No LMP recorded. Patient has had a hysterectomy. admitted for a laparoscopic left salpingo oophorectomy and cyst removal or drainage, if It is peritoneal inclusion cyst.  Pt has expereinced increased LLQ pain Has history of chronic pain and is being actively managed for such I shared with patient and niece the uncertainty of her post operative syptoms and response based on these facts  PMH:    Past Medical History:  Diagnosis Date  . Allergic rhinitis   . Anxiety   . Arthritis   . Cataracts, bilateral   . Chronic back pain    Sees pain [facility] clinic  . Chronic pain syndrome   . Constipation   . Depression   . GERD (gastroesophageal reflux disease)   . Headaches, cluster 3 yrs ago   last one  . High cholesterol   . Hypertension   . Hypothyroid   . Immature cataract   . Limp    right leg  . Mania (HCC)   . PTSD (post-traumatic stress disorder)   . Right lumbar radiculopathy   . Seizures (HCC) 20 yrs ago   unknown etiology-on meds  . Sleep apnea    STOP BANG score=4  . 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(HCC)     PSH:     Past Surgical History:  Procedure Laterality Date  . BACK SURGERY    . CATARACT EXTRACTION W/PHACO Left 01/04/2016   Procedure: CATARACT EXTRACTION PHACO AND INTRAOCULAR LENS PLACEMENT (IOC);  Surgeon: Jethro Bolus, MD;  Location: [facility];  Service: Ophthalmology;  Laterality: Left;  CDE:4.70  . CATARACT EXTRACTION W/PHACO Right 01/18/2016   Procedure: CATARACT EXTRACTION PHACO AND INTRAOCULAR LENS PLACEMENT RIGHT EYE; CDE:  4.66;  Surgeon: Jethro Bolus, MD;  Location: [facility];  Service: Ophthalmology;  Laterality: Right;  . CHOLECYSTECTOMY    . COLONOSCOPY  10/16/2008   YQM:VHQIONGEXBM, otherwise normal rectum; pancolonic diverticula the remainder of the colonic mucosa appeared normal. Next  TCS due 10/2013.  Marland Kitchen COLONOSCOPY  06/28/2006   RMR: Internal hemorrhoids. Diminutive rectal polyps, cold biopsied/ Pancolonic diverticula. Polyps in the right colon removed   . COLONOSCOPY WITH PROPOFOL N/A 05/20/2015   RMR: Pancolonic diverticulosis. Redundant colon   . ESOPHAGOGASTRODUODENOSCOPY  10/16/2008   WUX:LKGMW hiatal hernia otherwise normal esophagus, stomach  D1, D2, status post passage of a 56-French Maloney dilator  . ESOPHAGOGASTRODUODENOSCOPY  06/28/2006   NUU:VOZDGU esophagus. Small hiatal hernia. Otherwise normal stomach, D1 and D2, status post passage of a 56 [place] Maloney dilator  . ESOPHAGOGASTRODUODENOSCOPY (EGD) WITH ESOPHAGEAL DILATION N/A 07/14/2013   RMR: Abnormal esophagus of uncertain significance-status post esophageal biospy. small hiatal hernia.   Marland Kitchen ESOPHAGOGASTRODUODENOSCOPY (EGD) WITH PROPOFOL N/A 05/20/2015   RMR: NORMAL EGD status post maloney dilation   . ESOPHAGOGASTRODUODENOSCOPY (EGD) WITH PROPOFOL N/A 12/23/2018   Dr. Jena Gauss: normal esophagus, s/p esophageal dilation.   Marland Kitchen FOOT SURGERY     left foot-  . INCISION AND DRAINAGE Right 01/03/2013   Procedure: INCISION AND DRAINAGE;  Surgeon: Vickki Hearing, MD;  Location: [facility];  Service: Orthopedics;  Laterality: Right;  . KNEE ARTHROSCOPY  01/2012   right  . KNEE ARTHROSCOPY WITH LATERAL RELEASE Right 01/03/2013   Procedure: Lateral Release Patella Right Knee;  Surgeon: Vickki Hearing, MD;  Location: [facility];  Service: Orthopedics;  Laterality: Right;  .  left foot  x 2  . LUMBAR DISC SURGERY  L4-5  . MALONEY DILATION N/A 05/20/2015   Procedure: Venia Minks DILATION;  Surgeon: Daneil Dolin, MD;  Location: [facility];  Service: Endoscopy;  Laterality: N/A;  Maloney dilator # 70  . MALONEY DILATION N/A 12/23/2018   Procedure: Venia Minks DILATION;  Surgeon: Daneil Dolin, MD;  Location: [facility];  Service: Endoscopy;  Laterality: N/A;  . oophoectomy  bilateral  . PARTIAL HYSTERECTOMY    . TOTAL KNEE ARTHROPLASTY   07/01/2012   Procedure: TOTAL KNEE ARTHROPLASTY;  Surgeon: Carole Civil, MD;  Location: [facility];  Service: Orthopedics;  Laterality: Right;  . TOTAL KNEE REVISION Right 01/03/2013   Procedure: Patellaplasty Right Knee;  Surgeon: Carole Civil, MD;  Location: [facility];  Service: Orthopedics;  Laterality: Right;    POb/GynH:      OB History    Gravida  2   Para  2   Term  2   Preterm      AB      Living  2     SAB      TAB      Ectopic      Multiple      Live Births              SH:   Social History   Tobacco Use  . Smoking status: Former Smoker    Packs/day: 0.25    Years: 5.00    Pack years: 1.25    Types: Cigarettes    Quit date: 06/26/2003    Years since quitting: 16.3  . Smokeless tobacco: Never Used  Substance Use Topics  . Alcohol use: Yes    Alcohol/week: 0.0 standard drinks    Comment: occasionally; once a month.  . Drug use: No    Comment: has a past history of street drug use    FH:    Family History  Problem Relation Age of Onset  . Alcohol abuse Mother   . Anxiety disorder Mother   . Alcohol abuse Father   . Anxiety disorder Father   . Bipolar disorder Sister   . Alcohol abuse Brother   . Anxiety disorder Brother   . Drug abuse Brother   . Dementia Paternal Aunt   . ADD / ADHD Grandchild   . ADD / ADHD Grandchild   . Drug abuse Brother   . Alcohol abuse Brother   . Anxiety disorder Brother   . Seizures Brother   . Alcohol abuse Brother   . Anxiety disorder Brother   . Seizures Brother   . Alcohol abuse Brother   . Anxiety disorder Brother   . Alcohol abuse Brother   . Anxiety disorder Brother   . Heart disease Other   . Diabetes Other   . Alcohol abuse Other   . Depression Neg Hx   . OCD Neg Hx   . Paranoid behavior Neg Hx   . Schizophrenia Neg Hx   . Sexual abuse Neg Hx   . Physical abuse Neg Hx   . Colon cancer Neg Hx      Allergies:   Medications:       Current Facility-Administered Medications:  .   bupivacaine liposome (EXPAREL) 1.3 % injection 266 mg, 20 mL, Infiltration, Once, [place], Mertie Clause, MD .  clindamycin (CLEOCIN) IVPB 900 mg, 900 mg, Intravenous, 60 min Pre-Op **AND** gentamicin (GARAMYCIN) 490 mg in dextrose 5 % 100 mL IVPB, 5 mg/kg, Intravenous,  60 min Pre-Op, Lazaro Arms, MD .  ketorolac (TORADOL) 30 MG/ML injection 30 mg, 30 mg, Intravenous, Once, Maxi Carreras, Amaryllis Dyke, MD .  lactated ringers infusion, , Intravenous, Once, Battula, Rajamani C, MD .  midazolam (VERSED) injection 2 mg, 2 mg, Intravenous, Once, Battula, Rajamani C, MD  Review of Systems:   Review of Systems  Constitutional: Negative for fever, chills, weight loss, malaise/fatigue and diaphoresis.  HENT: Negative for hearing loss, ear pain, nosebleeds, congestion, sore throat, neck pain, tinnitus and ear discharge.   Eyes: Negative for blurred vision, double vision, photophobia, pain, discharge and redness.  Respiratory: Negative for cough, hemoptysis, sputum production, shortness of breath, wheezing and stridor.   Cardiovascular: Negative for chest pain, palpitations, orthopnea, claudication, leg swelling and PND.  Gastrointestinal: Positive for abdominal pain. Negative for heartburn, nausea, vomiting, diarrhea, constipation, blood in stool and melena.  Genitourinary: Negative for dysuria, urgency, frequency, hematuria and flank pain.  Musculoskeletal: Negative for myalgias, back pain, joint pain and falls.  Skin: Negative for itching and rash.  Neurological: Negative for dizziness, tingling, tremors, sensory change, speech change, focal weakness, seizures, loss of consciousness, weakness and headaches.  Endo/Heme/Allergies: Negative for environmental allergies and polydipsia. Does not bruise/bleed easily.  Psychiatric/Behavioral: Negative for depression, suicidal ideas, hallucinations, memory loss and substance abuse. The patient is not nervous/anxious and does not have insomnia.      PHYSICAL EXAM:  There  were no vitals taken for this visit.    Vitals reviewed. Constitutional: She is oriented to person, place, and time. She appears well-developed and well-nourished.  HENT:  Head: Normocephalic and atraumatic.  Right Ear: External ear normal.  Left Ear: External ear normal.  Nose: Nose normal.  Mouth/Throat: Oropharynx is clear and moist.  Eyes: Conjunctivae and EOM are normal. Pupils are equal, round, and reactive to light. Right eye exhibits no discharge. Left eye exhibits no discharge. No scleral icterus.  Neck: Normal range of motion. Neck supple. No tracheal deviation present. No thyromegaly present.  Cardiovascular: Normal rate, regular rhythm, normal heart sounds and intact distal pulses.  Exam reveals no gallop and no friction rub.   No murmur heard. Respiratory: Effort normal and breath sounds normal. No respiratory distress. She has no wheezes. She has no rales. She exhibits no tenderness.  GI: Soft. Bowel sounds are normal. She exhibits no distension and no mass. There is tenderness. There is no rebound and no guarding.  Genitourinary:       Vulva is normal without lesions Vagina is pink moist without discharge Cervix normal in appearance and pap is normal Uterus is uterus absent Adnexa is negative with normal sized ovaries by sonogram  Musculoskeletal: Normal range of motion. She exhibits no edema and no tenderness.  Neurological: She is alert and oriented to person, place, and time. She has normal reflexes. She displays normal reflexes. No cranial nerve deficit. She exhibits normal muscle tone. Coordination normal.  Skin: Skin is warm and dry. No rash noted. No erythema. No pallor.  Psychiatric: She has a normal mood and affect. Her behavior is normal. Judgment and thought content normal.    Labs: Results for orders placed or performed during the hospital encounter of 11/10/19 (from the past 336 hour(s))  CBC   Collection Time: 11/10/19  1:20 PM  Result Value Ref Range    WBC 5.2 4.0 - 10.5 K/uL   RBC 3.91 3.87 - 5.11 MIL/uL   Hemoglobin 11.5 (L) 12.0 - 15.0 g/dL   HCT 01.0 93.2 - 35.5 %  MCV 93.9 80.0 - 100.0 fL   MCH 29.4 26.0 - 34.0 pg   MCHC 31.3 30.0 - 36.0 g/dL   RDW 94.5 03.8 - 88.2 %   Platelets 184 150 - 400 K/uL   nRBC 0.0 0.0 - 0.2 %  Comprehensive metabolic panel   Collection Time: 11/10/19  1:20 PM  Result Value Ref Range   Sodium 140 135 - 145 mmol/L   Potassium 3.7 3.5 - 5.1 mmol/L   Chloride 103 98 - 111 mmol/L   CO2 31 22 - 32 mmol/L   Glucose, Bld 95 70 - 99 mg/dL   BUN 16 8 - 23 mg/dL   Creatinine, Ser 8.00 0.44 - 1.00 mg/dL   Calcium 8.4 (L) 8.9 - 10.3 mg/dL   Total Protein 7.0 6.5 - 8.1 g/dL   Albumin 3.4 (L) 3.5 - 5.0 g/dL   AST 10 (L) 15 - 41 U/L   ALT 9 0 - 44 U/L   Alkaline Phosphatase 40 38 - 126 U/L   Total Bilirubin 0.6 0.3 - 1.2 mg/dL   GFR calc non Af Amer >60 >60 mL/min   GFR calc Af Amer >60 >60 mL/min   Anion gap 6 5 - 15  Rapid HIV screen (HIV 1/2 Ab+Ag)   Collection Time: 11/10/19  1:20 PM  Result Value Ref Range   HIV-1 P24 Antigen - HIV24 NON REACTIVE NON REACTIVE   HIV 1/2 Antibodies NON REACTIVE NON REACTIVE   Interpretation (HIV Ag Ab)      A non reactive test result means that HIV 1 or HIV 2 antibodies and HIV 1 p24 antigen were not detected in the specimen.  Urinalysis, Routine w reflex microscopic   Collection Time: 11/10/19  1:20 PM  Result Value Ref Range   Color, Urine YELLOW YELLOW   APPearance HAZY (A) CLEAR   Specific Gravity, Urine 1.029 1.005 - 1.030   pH 5.0 5.0 - 8.0   Glucose, UA NEGATIVE NEGATIVE mg/dL   Hgb urine dipstick SMALL (A) NEGATIVE   Bilirubin Urine SMALL (A) NEGATIVE   Ketones, ur NEGATIVE NEGATIVE mg/dL   Protein, ur 30 (A) NEGATIVE mg/dL   Nitrite NEGATIVE NEGATIVE   Leukocytes,Ua SMALL (A) NEGATIVE   RBC / HPF 11-20 0 - 5 RBC/hpf   WBC, UA 11-20 0 - 5 WBC/hpf   Bacteria, UA MANY (A) NONE SEEN   Squamous Epithelial / LPF 6-10 0 - 5   Mucus PRESENT    Budding  Yeast PRESENT   Type and screen   Collection Time: 11/10/19  1:20 PM  Result Value Ref Range   ABO/RH(D) O POS    Antibody Screen NEG    Sample Expiration      11/13/2019,2359 Performed at [facility], [address] [place], [place] [address]   Results for orders placed or performed during the hospital encounter of 11/10/19 (from the past 336 hour(s))  SARS CORONAVIRUS 2 (TAT 6-24 HRS) Nasopharyngeal Nasopharyngeal Swab   Collection Time: 11/10/19  7:16 AM   Specimen: Nasopharyngeal Swab  Result Value Ref Range   SARS Coronavirus 2 NEGATIVE NEGATIVE    EKG: Orders placed or performed during the hospital encounter of 10/28/18  . ED EKG  . ED EKG  . EKG 12-Lead  . EKG 12-Lead  . EKG    Imaging Studies: DG Chest 2 View  Result Date: 10/20/2019 CLINICAL DATA:  Unexplained weight loss for 1 year, hypertension, tobacco abuse EXAM: CHEST - 2 VIEW COMPARISON:  10/28/2018 FINDINGS:  The heart size and mediastinal contours are within normal limits. Both lungs are clear. The visualized skeletal structures are unremarkable. IMPRESSION: No active cardiopulmonary disease. Electronically Signed   By: Sharlet Salina M.D.   On: 10/20/2019 23:04      Assessment:  Left adnexal mass, cystic, seems to be peritoneal inclusion cyst LLQ pain  Patient Active Problem List   Diagnosis Date Noted  . Poor appetite 09/24/2019  . Abnormal weight loss 09/24/2019  . Upper abdominal mass 09/24/2019  . Dysphagia 01/30/2019  . Simple adnexal cyst greater than 1 cm in diameter in postmenopausal patient 11/08/2018  . Abdominal pain 08/09/2015  . Dysphagia, pharyngoesophageal phase   . H/O adenomatous polyp of colon 05/05/2015  . Constipation 02/02/2015  . Knee contusion 12/09/2013  . Esophageal dysphagia 07/02/2013  . Abdominal pain, epigastric 07/02/2013  . Chronic pain syndrome 11/28/2012  . 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 Cellulitis 11/04/2012  . Postoperative wound breakdown  11/04/2012  . 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 Knee pain 08/13/2012  . Hypokalemia 08/01/2012  . Cellulitis, wound, post-operative 07/30/2012  . Degenerative tear of medial meniscus of right knee 01/12/2012  . OA (osteoarthritis) of knee 01/12/2012  . Right knee sprain 11/02/2011  . Instability of knee joint 03/29/2011  . URTICARIA 12/21/2009  . OTHER URINARY INCONTINENCE 07/18/2009  . FOOT PAIN, LEFT 05/26/2009  . DERMATITIS, CHRONIC 10/21/2008  . IMPAIRED GLUCOSE TOLERANCE 09/09/2008  . BACK PAIN WITH RADICULOPATHY 09/09/2008  . HYPOTHYROIDISM 02/27/2008  . HYPERLIPIDEMIA 02/27/2008  . ANXIETY 02/27/2008  . Bipolar I disorder, most recent episode (or current) depressed, unspecified 02/27/2008  . HYPERTENSION 02/27/2008  . ALLERGIC RHINITIS 02/27/2008  . GERD 02/27/2008  . HEADACHE 02/27/2008    Plan: Laparoscopic removal of left tube and ovary with removal and/or drainage of cyst dependiing on the origin, ovarian vs peritoneal inclusion  Pt aware despite the surgery if it is periotneal inclusion cyst it may recur in time and is unrelated to GYN issues going forward  Lazaro Arms 11/12/2019 11:06 AM

## 2019-11-12 NOTE — Anesthesia Procedure Notes (Signed)
Procedure Name: Intubation Performed by: Anallely Rosell E, CRNA Pre-anesthesia Checklist: Patient identified, Patient being monitored, Timeout performed, Emergency Drugs available and Suction available Patient Re-evaluated:Patient Re-evaluated prior to induction Oxygen Delivery Method: Circle system utilized Preoxygenation: Pre-oxygenation with 100% oxygen Induction Type: IV induction Ventilation: Mask ventilation without difficulty Laryngoscope Size: Mac and 3 Grade View: Grade I Tube type: Oral Tube size: 7.0 mm Number of attempts: 1 Airway Equipment and Method: Stylet Placement Confirmation: ETT inserted through vocal cords under direct vision,  positive ETCO2 and breath sounds checked- equal and bilateral Secured at: 21 cm Tube secured with: Tape Dental Injury: Teeth and Oropharynx as per pre-operative assessment        

## 2019-11-12 NOTE — Op Note (Signed)
Preoperative diagnosis: 1.   Left cystic mass simple with normal Ca1 25 most consistent                                                with a peritoneal inclusion cyst                                            2.  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  Procedure: Laparoscopic lysis of adhesions, extensive, with drainage of peritoneal inclusion cyst  Surgeon:Luther Lauretta Chester, MD  Anesthesia: General tracheal  Findings: There was extensive pelvic abdominal adhesive disease The omentum was adherent to the anterior abdominal wall as well as loops of small bowel The sigmoid colon was adherent to the left pelvic sidewall The entire pelvis was essentially adhesions of bowel omentum and peritoneum  Description of operation: Patient was taken to the operating room placed in the supine position where she underwent general tracheal anesthesia She was placed in low lithotomy position and prepped and draped for laparoscopic procedure Incision was made in the umbilicus and carried down to the rectus fascia The rectus fascia was grasped with a single Coker clamp A varies needle was placed into the peritoneal cavity with 1 pass light difficulty The peritoneal cavity was insufflated without difficulty An 11 mm nonbladed direct video laparoscope trocar was placed into the peritoneal cavity under direct visualization with 1 pass light difficulty The peritoneal cavity was confirmed Peritoneal and pelvic abdominal adhesions were extensive I was able to find a small window in the left lower quadrant put in a 5 mm trocar which I did under direct visualization without difficulty I then spent about 20 minutes doing blunt dissection through that trocar to allow a second 5 mm trocar in the right lower quadrant to be  placed In order to properly identify the tract I used a 21-gauge spinal needle as a guide for that trocar Incision was made in the right lower quadrant placed light difficulty under direct visualization I then used the harmonic scalpel to take adhesions down as I could safely along with blunt dissection and traction I really used a minimal of energy to prevent any bowel injury When I was doing the left lower quadrant pelvic adhesions there was a large amount of fluid that was released when I was able to dissect the sigmoid colon off the pelvic sidewall I believe this was the peritoneal inclusion cyst that was seen on scan The ovary itself was never identified I completely liberated the sigmoid colon from the pelvic sidewall as it came over the pelvic brim and down the pelvis but was unable to ever identify the ovary and certainly no mass in the ovary Essentially I had the entire pelvis right and left lower quadrants below the pelvic brim cleared of all adhesions of the  omentum and bowel and there was no additional mass As result my conclusion this represented a peritoneal inclusion cyst and given the findings of severe pelvic abdominal heat as of disease and normal Ca1 25 and inability to identify the ovary I believe this is consistent with the intraoperative findings as well  Because the sigmoid colon was densely adherent to the pelvic sidewall and anterior abdominal wall even took great care and evaluating the entire serosal surface There were a couple of areas where the adhesions called slight serosal abrasions but nothing deeper than just superficial serosal abrasion There was no significant thickness injury at all to the sigmoid colon and it was inspected thoroughly Throughout this entire case suction irrigation was used to again dissect delineate tissues and for hemostasis At the end of the case I left probably 1500 cc of saline in the pelvis to try to prevent reformation of the pelvic  abdominal adhesive disease Given the severity of the peritoneal surfaces I doubt this will be successful but certainly a reasonable attempt to try to minimize postoperative adhesions from this point forward  All 3 trochars were removed the 2 lower quadrant ones were removed and there were hemostatic areas The umbilical one was removed as well  The umbilical fascia was closed with a single 0 Vicryl suture the subcutaneous tissue was also reapproximated with a single 0 Vicryl suture All 3 skin incisions were closed with skin staples x3 10 cc of Exparel was injected the umbilicus and 5 cc of Exparel was injected in each lower quadrant incision Blood loss for the procedure was minimal may be 20 cc total  The patient received Cleocin and gentamicin preoperatively prophylactically as well as 30 of Toradol She is discharged home be followed up in approximately week and a half for postoperative visit  Florian Buff, MD 11/12/2019 1:39 PM

## 2019-11-12 NOTE — Transfer of Care (Signed)
Immediate Anesthesia Transfer of Care Note  Patient: Diane [name]  Procedure(s) Performed: LAPAROSCOPIC LYSIS OF ADHESIONS, extensive; drainage of peritoneal cyst (N/A Abdomen)  Patient Location: PACU  Anesthesia Type:General  Level of Consciousness: awake and patient cooperative  Airway & Oxygen Therapy: Patient Spontanous Breathing and Patient connected to nasal cannula oxygen  Post-op Assessment: Report given to RN and Post -op Vital signs reviewed and stable  Post vital signs: Reviewed and stable  Last Vitals:  Vitals Value Taken Time  BP 137/71 11/12/19 1354  Temp    Pulse 82 11/12/19 1356  Resp 14 11/12/19 1356  SpO2 95 % 11/12/19 1356  Vitals shown include unvalidated device data.  Last Pain:  Vitals:   11/12/19 1122  TempSrc: Oral  PainSc: 7       Patients Stated Pain Goal: 6 (11/12/19 1122)  Complications: No apparent anesthesia complications

## 2019-11-12 NOTE — Progress Notes (Signed)
D/C instructions reviewed with Marilu Favre (friend). Voiced understanding.

## 2019-11-12 NOTE — Anesthesia Postprocedure Evaluation (Signed)
Anesthesia Post Note  Patient: Diane [name]  Procedure(s) Performed: LAPAROSCOPIC LYSIS OF ADHESIONS, extensive; drainage of peritoneal cyst (N/A Abdomen)  Patient location during evaluation: PACU Anesthesia Type: General Level of consciousness: awake Pain management: pain level controlled Vital Signs Assessment: post-procedure vital signs reviewed and stable Respiratory status: spontaneous breathing and patient connected to nasal cannula oxygen Cardiovascular status: stable Postop Assessment: no apparent nausea or vomiting Anesthetic complications: no     Last Vitals:  Vitals:   11/12/19 1356 11/12/19 1400  BP: 137/71 131/83  Pulse: 81   Resp:  15  Temp: 36.5 C   SpO2: 93% 90%    Last Pain:  Vitals:   11/12/19 1356  TempSrc:   PainSc: 0-No pain                 Edison Pace

## 2019-11-12 NOTE — Discharge Instructions (Addendum)
Monitored Anesthesia Care, Care After These instructions provide you with information about caring for yourself after your procedure. Your health care provider may also give you more specific instructions. Your treatment has been planned according to current medical practices, but problems sometimes occur. Call your health care provider if you have any problems or questions after your procedure. What can I expect after the procedure? After your procedure, you may:  Feel sleepy for several hours.  Feel clumsy and have poor balance for several hours.  Feel forgetful about what happened after the procedure.  Have poor judgment for several hours.  Feel nauseous or vomit.  Have a sore throat if you had a breathing tube during the procedure. Follow these instructions at home: For at least 24 hours after the procedure:      Have a responsible adult stay with you. It is important to have someone help care for you until you are awake and alert.  Rest as needed.  Do not: ? Participate in activities in which you could fall or become injured. ? Drive. ? Use heavy machinery. ? Drink alcohol. ? Take sleeping pills or medicines that cause drowsiness. ? Make important decisions or sign legal documents. ? Take care of children on your own. Eating and drinking  Follow the diet that is recommended by your health care provider.  If you vomit, drink water, juice, or soup when you can drink without vomiting.  Make sure you have little or no nausea before eating solid foods. General instructions  Take over-the-counter and prescription medicines only as told by your health care provider.  If you have sleep apnea, surgery and certain medicines can increase your risk for breathing problems. Follow instructions from your health care provider about wearing your sleep device: ? Anytime you are sleeping, including during daytime naps. ? While taking prescription pain medicines, sleeping medicines,  or medicines that make you drowsy.  If you smoke, do not smoke without supervision.  Keep all follow-up visits as told by your health care provider. This is important. Contact a health care provider if:  You keep feeling nauseous or you keep vomiting.  You feel light-headed.  You develop a rash.  You have a fever. Get help right away if:  You have trouble breathing. Summary  For several hours after your procedure, you may feel sleepy and have poor judgment.  Have a responsible adult stay with you for at least 24 hours or until you are awake and alert. This information is not intended to replace advice given to you by your health care provider. Make sure you discuss any questions you have with your health care provider. Document Revised: 11/26/2017 Document Reviewed: 12/19/2015 Elsevier Patient Education  [employer]. Peritoneal Inclusion Cyst, Care After This sheet gives you information about how to care for yourself after your procedure. Your health care provider may also give you more specific instructions. If you have problems or questions, contact your health care provider. What can I expect after the procedure? After the procedure, it is common to have:  Pain in the abdomen, especially at the incision areas. You will be given pain medicines to control the pain.  Tiredness. This is a normal part of the recovery process. Your energy level will return to normal over the next several weeks. Follow these instructions at home: Medicines  Take over-the-counter and prescription medicines only as told by your health care provider.  If you were prescribed an antibiotic medicine, use it as told by  your health care provider. Do not stop using the antibiotic even if you start to feel better.  Do not take aspirin because it can cause bleeding.  Ask your health care provider if the medicine prescribed to you: ? Requires you to avoid driving or using heavy machinery. ? Can  cause constipation. You may need to take these actions to prevent or treat constipation:  Drink enough fluid to keep your urine pale yellow.  Take over-the-counter or prescription medicines.  Eat foods that are high in fiber, such as beans, whole grains, and fresh fruits and vegetables.  Limit foods that are high in fat and processed sugars, such as fried or sweet foods. Incision care   Follow instructions from your health care provider about how to take care of your incisions. Make sure you: ? Wash your hands with soap and water before and after you change your bandage (dressing). If soap and water are not available, use hand sanitizer. ? Change your dressing as told by your health care provider. ? Leave stitches (sutures), skin glue, or adhesive strips in place. These skin closures may need to stay in place for 2 weeks or longer. If adhesive strip edges start to loosen and curl up, you may trim the loose edges. Do not remove adhesive strips completely unless your health care provider tells you to do that.  Check your incision areas every day for signs of infection. Check for: ? Redness, swelling, or pain. ? Fluid or blood. ? Warmth. ? Pus or a bad smell.  Do not take baths, swim, or use a hot tub until your health care provider approves. Ask your health care provider if you may take showers. You may only be allowed to take sponge baths. Activity  Rest as told by your health care provider.  Avoid sitting for a long time without moving. Get up to take short walks every 1-2 hours. This is important to improve blood flow and breathing. Ask for help if you feel weak or unsteady.  Do not lift anything that is heavier than 10 lb (4.5 kg), or the limit that you are told, until your health care provider says that it is safe.  Return to your normal activities and diet as told by your health care provider. Ask your health care provider what activities are safe for you. General  instructions  Do not douche, use tampons, or have sexual intercourse until your health care provider says it is okay to do so.  Do not use any products that contain nicotine or tobacco, such as cigarettes, e-cigarettes, and chewing tobacco. These can delay incision healing after surgery. If you need help quitting, ask your health care provider.  Keep all follow-up visits as told by your health care provider. This is important. Contact a health care provider if:  You have a fever.  You feel nauseous or you vomit.  You have pain when you urinate or have blood in your urine.  You have a rash on your body.  You have pain or redness where the IV was inserted.  You have pain that is not relieved with medicine.  You have any of these signs of infection: ? Redness, swelling, or pain around your incisions. ? Fluid or blood coming from your incisions. ? Warmth coming from an incision. ? Pus or a bad smell coming from your incisions. Get help right away if:  You have chest pain or shortness of breath.  You feel dizzy or light-headed.  You have  heavy bleeding.  You have increasing abdominal pain that is not relieved with medicines.  You have pain, swelling, or redness in your leg.  Your incision is opening (the edges are not staying together). Summary  After the procedure, it is common to have some pain in your abdomen. You will be given pain medicines to control the pain.  Follow instructions from your health care provider about how to take care of your incisions.  Do not douche, use tampons, or have sexual intercourse until your health care provider says it is okay to do so.  Keep all follow-up visits as told by your health care provider. This is important. This information is not intended to replace advice given to you by your health care provider. Make sure you discuss any questions you have with your health care provider. Document Revised: 03/27/2019 Document Reviewed:  03/27/2019 Elsevier Patient Education  2020 [employer].

## 2019-11-13 NOTE — Addendum Note (Signed)
Addendum  created 11/13/19 1130 by Moshe Salisbury, CRNA   Charge Capture section accepted

## 2019-11-14 ENCOUNTER — Other Ambulatory Visit: Payer: Self-pay | Admitting: Orthopedic Surgery

## 2019-11-14 DIAGNOSIS — G894 Chronic pain syndrome: Secondary | ICD-10-CM

## 2019-11-14 MED ORDER — OXYCODONE-ACETAMINOPHEN 7.5-325 MG PO TABS: 1.0000 | ORAL_TABLET | ORAL | 0 refills | Status: DC | PRN

## 2019-11-14 MED ORDER — OXYCODONE-ACETAMINOPHEN 7.5-325 MG PO TABS: 1.0000 | ORAL_TABLET | Freq: Four times a day (QID) | ORAL | 0 refills | Status: DC | PRN

## 2019-11-14 NOTE — Progress Notes (Signed)
Meds ordered this encounter  Medications  . oxyCODONE-acetaminophen (PERCOCET) 7.5-325 MG tablet    Sig: Take 1-2 tablets by mouth every 6 (six) hours as needed.    Dispense:  15 tablet    Refill:  0  . oxyCODONE-acetaminophen (PERCOCET) 7.5-325 MG tablet    Sig: Take 1 tablet by mouth every 4 (four) hours as needed for up to 7 days for severe pain.    Dispense:  42 tablet    Refill:  0

## 2019-11-17 ENCOUNTER — Other Ambulatory Visit: Payer: Self-pay

## 2019-11-17 DIAGNOSIS — G894 Chronic pain syndrome: Secondary | ICD-10-CM

## 2019-11-18 ENCOUNTER — Other Ambulatory Visit: Payer: Self-pay | Admitting: Orthopedic Surgery

## 2019-11-18 DIAGNOSIS — G894 Chronic pain syndrome: Secondary | ICD-10-CM

## 2019-11-18 MED ORDER — OXYCODONE-ACETAMINOPHEN 7.5-325 MG PO TABS: 1.0000 | ORAL_TABLET | ORAL | 0 refills | Status: DC | PRN

## 2019-11-24 ENCOUNTER — Other Ambulatory Visit: Payer: Self-pay | Admitting: Radiology

## 2019-11-24 ENCOUNTER — Ambulatory Visit (INDEPENDENT_AMBULATORY_CARE_PROVIDER_SITE_OTHER): Payer: Medicare Other | Admitting: Obstetrics & Gynecology

## 2019-11-24 ENCOUNTER — Other Ambulatory Visit: Payer: Self-pay

## 2019-11-24 ENCOUNTER — Encounter: Payer: Self-pay | Admitting: Obstetrics & Gynecology

## 2019-11-24 VITALS — BP 142/85 | HR 77 | Ht 64.0 in | Wt 209.0 lb

## 2019-11-24 DIAGNOSIS — Z9889 Other specified postprocedural states: Secondary | ICD-10-CM

## 2019-11-24 DIAGNOSIS — G894 Chronic pain syndrome: Secondary | ICD-10-CM

## 2019-11-24 NOTE — Progress Notes (Signed)
  HPI: Patient returns for routine postoperative follow-up having undergone 11/12/19 on laparoscopy for adnexal mass.  The patient's immediate postoperative recovery has been unremarkable. Since hospital discharge the patient reports no problems.   Current Outpatient Medications: .  ALPRAZolam (XANAX) 0.5 MG tablet, Take 0.5 mg by mouth 2 (two) times daily as needed for anxiety., Disp: , Rfl:  .  AMITIZA 24 MCG capsule, TAKE ONE CAPSULE BY MOUTH TWICE DAILY. TAKE WITH A MEAL. (Patient taking differently: Take 24 mcg by mouth 2 (two) times daily with a meal. ), Disp: 60 capsule, Rfl: 5 .  atorvastatin (LIPITOR) 20 MG tablet, Take 20 mg by mouth daily., Disp: , Rfl:  .  cyclobenzaprine (FLEXERIL) 10 MG tablet, Take 1 tablet (10 mg total) by mouth 2 (two) times daily as needed for muscle spasms., Disp: 60 tablet, Rfl: 5 .  divalproex (DEPAKOTE) 250 MG DR tablet, Take 3 tablets (750 mg total) by mouth at bedtime., Disp: 90 tablet, Rfl: 2 .  FLUoxetine (PROZAC) 20 MG capsule, Take 1 capsule (20 mg total) by mouth 2 (two) times daily., Disp: 60 capsule, Rfl: 2 .  ketorolac (TORADOL) 10 MG tablet, Take 1 tablet (10 mg total) by mouth every 8 (eight) hours as needed., Disp: 15 tablet, Rfl: 0 .  levothyroxine (SYNTHROID, LEVOTHROID) 150 MCG tablet, Take 150 mcg by mouth daily before breakfast. , Disp: , Rfl: 5 .  lisinopril (PRINIVIL,ZESTRIL) 10 MG tablet, Take 10 mg by mouth at bedtime. , Disp: , Rfl:  .  meloxicam (MOBIC) 7.5 MG tablet, Take 1 tablet (7.5 mg total) by mouth daily., Disp: 60 tablet, Rfl: 2 .  ondansetron (ZOFRAN ODT) 8 MG disintegrating tablet, Take 1 tablet (8 mg total) by mouth every 8 (eight) hours as needed for nausea or vomiting., Disp: 8 tablet, Rfl: 0 .  oxyCODONE-acetaminophen (PERCOCET) 7.5-325 MG tablet, Take 1 tablet by mouth every 4 (four) hours as needed for up to 7 days for severe pain., Disp: 42 tablet, Rfl: 0 .  pantoprazole (PROTONIX) 40 MG tablet, Take 1 tablet (40 mg  total) by mouth 2 (two) times daily., Disp: 60 tablet, Rfl: 5 .  SYMPROIC 0.2 MG TABS, TAKE ONE TABLET BY MOUTH DAILY FOR CONSTIPATION ON DAYS YOU TAKE YOUR PAIN MEDICATION. (Patient taking differently: Take 0.2 mg by mouth daily as needed (when taking oxycodone). ), Disp: 30 tablet, Rfl: 3 .  traZODone (DESYREL) 100 MG tablet, Take 2 tablets (200 mg total) by mouth at bedtime., Disp: 30 tablet, Rfl: 2 .  hydrocortisone (ANUSOL-HC) 2.5 % rectal cream, Place 1 application rectally 2 (two) times daily. (Patient not taking: Reported on 11/05/2019), Disp: 30 g, Rfl: 1 .  oxyCODONE-acetaminophen (PERCOCET) 7.5-325 MG tablet, Take 1-2 tablets by mouth every 6 (six) hours as needed., Disp: 15 tablet, Rfl: 0  No current facility-administered medications for this visit.    Blood pressure (!) 142/85, pulse 77, height 5\' 4"  (1.626 m), weight 209 lb (94.8 kg).  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

## 2019-11-24 NOTE — Telephone Encounter (Signed)
Patient called in requesting refill of medication.  

## 2019-11-25 MED ORDER — OXYCODONE-ACETAMINOPHEN 7.5-325 MG PO TABS: 1.0000 | ORAL_TABLET | ORAL | 0 refills | Status: DC | PRN

## 2019-11-25 NOTE — Telephone Encounter (Signed)
I called, someone answered and kept saying "huh", I will call back later, need to tell her Rx cannot be filled yet, too soon.

## 2019-11-28 ENCOUNTER — Ambulatory Visit (HOSPITAL_COMMUNITY): Payer: Medicare Other | Admitting: Psychiatry

## 2019-11-28 ENCOUNTER — Other Ambulatory Visit: Payer: Self-pay

## 2019-12-02 ENCOUNTER — Other Ambulatory Visit: Payer: Self-pay | Admitting: Orthopedic Surgery

## 2019-12-02 DIAGNOSIS — G894 Chronic pain syndrome: Secondary | ICD-10-CM

## 2019-12-02 MED ORDER — OXYCODONE-ACETAMINOPHEN 7.5-325 MG PO TABS: 1.0000 | ORAL_TABLET | ORAL | 0 refills | Status: DC | PRN

## 2019-12-02 NOTE — Telephone Encounter (Signed)
Patient called for refill:  oxyCODONE-acetaminophen (PERCOCET) 7.5-325 MG tablet 42 tablet  - [name], [place]

## 2019-12-04 ENCOUNTER — Other Ambulatory Visit (HOSPITAL_COMMUNITY): Payer: Self-pay | Admitting: Psychiatry

## 2019-12-09 ENCOUNTER — Other Ambulatory Visit: Payer: Self-pay | Admitting: Orthopedic Surgery

## 2019-12-09 DIAGNOSIS — G894 Chronic pain syndrome: Secondary | ICD-10-CM

## 2019-12-09 MED ORDER — OXYCODONE-ACETAMINOPHEN 7.5-325 MG PO TABS: 1.0000 | ORAL_TABLET | ORAL | 0 refills | Status: DC | PRN

## 2019-12-09 NOTE — Telephone Encounter (Signed)
Patient requests refiil on Oxycodone/Acetaminophen 705-325  Mgs.  Qty  42      Sig: Take 1 tablet by mouth every 4 (four) hours as needed for up to 7 days for severe pain.   Patient states she uses [name] in [place]

## 2019-12-10 ENCOUNTER — Encounter: Payer: Self-pay | Admitting: Gastroenterology

## 2019-12-10 ENCOUNTER — Ambulatory Visit (INDEPENDENT_AMBULATORY_CARE_PROVIDER_SITE_OTHER): Payer: Medicare Other | Admitting: Gastroenterology

## 2019-12-10 ENCOUNTER — Other Ambulatory Visit: Payer: Self-pay

## 2019-12-10 VITALS — BP 131/65 | HR 73 | Temp 97.2°F | Ht 64.0 in | Wt 206.8 lb

## 2019-12-10 DIAGNOSIS — K5900 Constipation, unspecified: Secondary | ICD-10-CM

## 2019-12-10 DIAGNOSIS — D649 Anemia, unspecified: Secondary | ICD-10-CM | POA: Insufficient documentation

## 2019-12-10 DIAGNOSIS — R1030 Lower abdominal pain, unspecified: Secondary | ICD-10-CM | POA: Diagnosis not present

## 2019-12-10 DIAGNOSIS — R634 Abnormal weight loss: Secondary | ICD-10-CM

## 2019-12-10 DIAGNOSIS — K219 Gastro-esophageal reflux disease without esophagitis: Secondary | ICD-10-CM

## 2019-12-10 NOTE — Progress Notes (Signed)
Primary Care Physician: Redmond School, MD  Primary Gastroenterologist:  Garfield Cornea, MD   Chief Complaint  Patient presents with  . Constipation    BM's are doing good, has several BM's through the day  . Abdominal Pain    lower abd, comes/goes    HPI: Diane [name] is a 69 y.o. female here for follow-up.  She was last seen in January 2021.  She has a history of chronic GERD, constipation, dysphagia.  History of colonic adenomas, next colonoscopy due in September 2021.  She has a history of progressive weight loss, poor appetite.  She was concerned about enlarging fullness in the upper abdomen at time of last office visit.  Recalls a 40 pound weight loss in 1 year we obtained a CT for further evaluation.  CT showed stable 5 cm benign-appearing cyst in the left adnexa recommending 1 year follow-up ultrasound.  This is already being followed by GYN.  Chest x-ray to further evaluate weight loss was unremarkable.  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  Patient continues to have a poor appetite. One meal and one snack per day. No n/v. No pain with eating. Feels like depression not well controlled. Has appt next month with psychiatrist. No drive to do anything. She feels tired. She has slipped and fallen couple of times. From GI standpoint, she is having BMs several per day, soft and mushy. No melena, brbpr. Heartburn well controlled. Feels like her LLQ pain is somewhat better since her surgery few weeks back. Continues to have intermittent lower abdominal pain. Not really sure what makes worse or better. No dysphagia.    She did not discuss her low TSH with her PCP yet.    Current Outpatient Medications  Medication Sig Dispense Refill  . ALPRAZolam (XANAX) 0.5 MG tablet Take 0.5 mg by mouth 2 (two) times daily as needed for anxiety.    . AMITIZA 24 MCG capsule TAKE ONE CAPSULE BY MOUTH TWICE DAILY.  TAKE WITH A MEAL. (Patient taking differently: Take 24 mcg by mouth 2 (two) times daily with a meal. ) 60 capsule 5  . atorvastatin (LIPITOR) 20 MG tablet Take 20 mg by mouth daily.    . cyclobenzaprine (FLEXERIL) 10 MG tablet Take 1 tablet (10 mg total) by mouth 2 (two) times daily as needed for muscle spasms. 60 tablet 5  . divalproex (DEPAKOTE) 250 MG DR tablet Take 3 tablets (750 mg total) by mouth at bedtime. 90 tablet 2  . FLUoxetine (PROZAC) 20 MG capsule Take 1 capsule (20 mg total) by mouth 2 (two) times daily. 60 capsule 2  . hydrocortisone (ANUSOL-HC) 2.5 % rectal cream Place 1 application rectally 2 (two) times daily. 30 g 1  . ketorolac (TORADOL) 10 MG tablet Take 1 tablet (10 mg total) by mouth every 8 (eight) hours as needed. 15 tablet 0  . levothyroxine (SYNTHROID, LEVOTHROID) 150 MCG tablet Take 150 mcg by mouth daily before breakfast.   5  . lisinopril (PRINIVIL,ZESTRIL) 10 MG tablet Take 10 mg by mouth at bedtime.     . meloxicam (MOBIC) 7.5 MG tablet Take 1 tablet (7.5 mg total) by mouth daily. 60 tablet 2  . ondansetron (ZOFRAN ODT) 8 MG disintegrating tablet Take 1 tablet (8 mg total) by mouth every 8 (eight) hours as needed for nausea or vomiting. 8 tablet 0  . oxyCODONE-acetaminophen (PERCOCET) 7.5-325 MG tablet Take 1-2  tablets by mouth every 6 (six) hours as needed. 15 tablet 0  . oxyCODONE-acetaminophen (PERCOCET) 7.5-325 MG tablet Take 1 tablet by mouth every 4 (four) hours as needed for up to 7 days for severe pain. 42 tablet 0  . pantoprazole (PROTONIX) 40 MG tablet Take 1 tablet (40 mg total) by mouth 2 (two) times daily. 60 tablet 5  . SYMPROIC 0.2 MG TABS TAKE ONE TABLET BY MOUTH DAILY FOR CONSTIPATION ON DAYS YOU TAKE YOUR PAIN MEDICATION. (Patient taking differently: Take 0.2 mg by mouth daily as needed (when taking oxycodone). ) 30 tablet 3  . traZODone (DESYREL) 100 MG tablet Take 2 tablets (200 mg total) by mouth at bedtime. 30 tablet 2   No current  facility-administered medications for this visit.    Allergies as of 12/10/2019 - Review Complete 12/10/2019  Allergen Reaction Noted  . Neomycin-bacitracin zn-polymyx Other (See Comments) 02/27/2008  . Penicillins Hives   . Sulfonamide derivatives Hives and Swelling 02/27/2008    ROS:  General: Negative for  fever, chills, fatigue. See hpi ENT: Negative for hoarseness, difficulty swallowing , nasal congestion. CV: Negative for chest pain, angina, palpitations, dyspnea on exertion, peripheral edema.  Respiratory: Negative for dyspnea at rest, dyspnea on exertion, cough, sputum, wheezing.  GI: See history of present illness. GU:  Negative for dysuria, hematuria, urinary incontinence, urinary frequency, nocturnal urination.  Endo:see hpi   Physical Examination:   BP 131/65   Pulse 73   Temp (!) 97.2 F (36.2 C) (Oral)   Ht 5\' 4"  (1.626 m)   Wt 206 lb 12.8 oz (93.8 kg)   BMI 35.50 kg/m   General: Well-nourished, well-developed in no acute distress.  Eyes: No icterus. Mouth: masked Lungs: Clear to auscultation bilaterally.  Heart: Regular rate and rhythm, no murmurs rubs or gallops.  Abdomen: Bowel sounds are normal,  nondistended, no hepatosplenomegaly or masses, no abdominal bruits or hernia , no rebound or guarding.  Mild lower abdominal tenderness with palpation.  Extremities: No lower extremity edema. No clubbing or deformities. Neuro: Alert and oriented x 4   Skin: Warm and dry, no jaundice.   Psych: Alert and cooperative, normal mood and affect.  Labs:  Lab Results  Component Value Date   CREATININE 0.82 11/10/2019   BUN 16 11/10/2019   NA 140 11/10/2019   K 3.7 11/10/2019   CL 103 11/10/2019   CO2 31 11/10/2019   Lab Results  Component Value Date   ALT 9 11/10/2019   AST 10 (L) 11/10/2019   ALKPHOS 40 11/10/2019   BILITOT 0.6 11/10/2019   Lab Results  Component Value Date   WBC 5.2 11/10/2019   HGB 11.5 (L) 11/10/2019   HCT 36.7 11/10/2019   MCV  93.9 11/10/2019   PLT 184 11/10/2019   Lab Results  Component Value Date   IRON 105 10/20/2019   TIBC 275 10/20/2019   FERRITIN 206 10/20/2019   Lab Results  Component Value Date   TSH 0.52 02/11/2018   Lab Results  Component Value Date   LIPASE 10 10/20/2019     Imaging Studies: No results found.

## 2019-12-10 NOTE — Assessment & Plan Note (Signed)
Continues to have intermittent lower abdominal pain although feels improved since her recent surgery.  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  Continues to follow with Dr. Despina Hidden.  Overall symptoms stable.

## 2019-12-10 NOTE — Assessment & Plan Note (Signed)
GERD symptoms well controlled on current regimen.  Continue pantoprazole as before.

## 2019-12-10 NOTE — Patient Instructions (Signed)
1. Continue pantoprazole twice daily before a meal.  2. Cut back on Amitiza to ONCE daily with a meal to see if you can a fewer bowel movements but still control the constipation. If you need twice daily, you can continue.  3. Please have your labs done between 8am and 10am. You must not eat or drink after midnight.  4. Once your labs are back, we will discuss possible colonoscopy.

## 2019-12-10 NOTE — Progress Notes (Signed)
Cc'ed to pcp °

## 2019-12-10 NOTE — Assessment & Plan Note (Signed)
Continued weight loss in the setting of poor appetite.  24-hour dietary recall reveals an adequate caloric intake.  May be multifactorial.  Admits to inadequate control of depression, has follow-up with her psychiatrist in the upcoming weeks.  CT abdomen pelvis, chest x-ray with no source to explain weight loss.  Previously TSH low in the setting of thyroid medication, patient did not follow-up with PCP for any potential adjustments.  She is due for colonoscopy this year for history of colonic adenomas, patient states her PCP told her she did not have to have any further colonoscopies due to her age of 60.  She may benefit from colonoscopy given weight loss, will hold off at this time until labs return.  Recheck TSH, free T4 along with fasting a.m. cortisol level.  Further recommendations to follow.

## 2019-12-10 NOTE — Assessment & Plan Note (Signed)
Chronic constipation, currently having several soft bowel movements daily, sometimes loose.  Taking Amitiza 24 mcg twice daily.  She takes Symproic once daily on days that she also takes narcotics.  She may be going a little bit too much at this point.  We will have her try backing Amitiza down to once daily to see how she does.

## 2019-12-15 ENCOUNTER — Other Ambulatory Visit: Payer: Self-pay

## 2019-12-15 DIAGNOSIS — G894 Chronic pain syndrome: Secondary | ICD-10-CM

## 2019-12-16 MED ORDER — OXYCODONE-ACETAMINOPHEN 7.5-325 MG PO TABS: 1.0000 | ORAL_TABLET | ORAL | 0 refills | Status: DC | PRN

## 2019-12-23 ENCOUNTER — Other Ambulatory Visit: Payer: Self-pay | Admitting: Orthopedic Surgery

## 2019-12-23 DIAGNOSIS — G894 Chronic pain syndrome: Secondary | ICD-10-CM

## 2019-12-23 MED ORDER — OXYCODONE-ACETAMINOPHEN 7.5-325 MG PO TABS: 1.0000 | ORAL_TABLET | ORAL | 0 refills | Status: DC | PRN

## 2019-12-23 NOTE — Telephone Encounter (Signed)
Patient requests refill: oxyCODONE-acetaminophen (PERCOCET) 7.5-325 MG tablet 42 tablet  -[name], [place]

## 2019-12-25 ENCOUNTER — Other Ambulatory Visit: Payer: Self-pay

## 2019-12-25 DIAGNOSIS — K5900 Constipation, unspecified: Secondary | ICD-10-CM

## 2019-12-25 DIAGNOSIS — R634 Abnormal weight loss: Secondary | ICD-10-CM

## 2019-12-25 DIAGNOSIS — R1030 Lower abdominal pain, unspecified: Secondary | ICD-10-CM

## 2019-12-25 DIAGNOSIS — D649 Anemia, unspecified: Secondary | ICD-10-CM

## 2019-12-25 DIAGNOSIS — K219 Gastro-esophageal reflux disease without esophagitis: Secondary | ICD-10-CM

## 2019-12-29 DIAGNOSIS — K5900 Constipation, unspecified: Secondary | ICD-10-CM | POA: Diagnosis not present

## 2019-12-29 DIAGNOSIS — R1030 Lower abdominal pain, unspecified: Secondary | ICD-10-CM | POA: Diagnosis not present

## 2019-12-29 DIAGNOSIS — K219 Gastro-esophageal reflux disease without esophagitis: Secondary | ICD-10-CM | POA: Diagnosis not present

## 2019-12-29 DIAGNOSIS — D649 Anemia, unspecified: Secondary | ICD-10-CM | POA: Diagnosis not present

## 2019-12-30 ENCOUNTER — Other Ambulatory Visit: Payer: Self-pay | Admitting: Orthopedic Surgery

## 2019-12-30 DIAGNOSIS — G894 Chronic pain syndrome: Secondary | ICD-10-CM

## 2019-12-30 LAB — CBC WITH DIFFERENTIAL/PLATELET
Absolute Monocytes: 513 cells/uL (ref 200–950)
Basophils Absolute: 11 cells/uL (ref 0–200)
Basophils Relative: 0.2 %
Eosinophils Absolute: 38 cells/uL (ref 15–500)
Eosinophils Relative: 0.7 %
HCT: 36.4 % (ref 35.0–45.0)
Hemoglobin: 11.5 g/dL — ABNORMAL LOW (ref 11.7–15.5)
Lymphs Abs: 2306 cells/uL (ref 850–3900)
MCH: 28.3 pg (ref 27.0–33.0)
MCHC: 31.6 g/dL — ABNORMAL LOW (ref 32.0–36.0)
MCV: 89.7 fL (ref 80.0–100.0)
MPV: 11.8 fL (ref 7.5–12.5)
Monocytes Relative: 9.5 %
Neutro Abs: 2533 cells/uL (ref 1500–7800)
Neutrophils Relative %: 46.9 %
Platelets: 200 10*3/uL (ref 140–400)
RBC: 4.06 10*6/uL (ref 3.80–5.10)
RDW: 12.4 % (ref 11.0–15.0)
Total Lymphocyte: 42.7 %
WBC: 5.4 10*3/uL (ref 3.8–10.8)

## 2019-12-30 LAB — IRON,TIBC AND FERRITIN PANEL
%SAT: 46 % (calc) — ABNORMAL HIGH (ref 16–45)
Ferritin: 280 ng/mL (ref 16–288)
Iron: 116 ug/dL (ref 45–160)
TIBC: 252 mcg/dL (calc) (ref 250–450)

## 2019-12-30 LAB — T4, FREE: Free T4: 1.9 ng/dL — ABNORMAL HIGH (ref 0.8–1.8)

## 2019-12-30 LAB — TSH+FREE T4: TSH W/REFLEX TO FT4: 0.01 mIU/L — ABNORMAL LOW (ref 0.40–4.50)

## 2019-12-30 LAB — CORTISOL-AM, BLOOD: Cortisol - AM: 5.8 ug/dL

## 2019-12-30 MED ORDER — OXYCODONE-ACETAMINOPHEN 7.5-325 MG PO TABS: 1.0000 | ORAL_TABLET | ORAL | 0 refills | Status: DC | PRN

## 2019-12-30 NOTE — Telephone Encounter (Signed)
Patient requests refill: °oxyCODONE-acetaminophen (PERCOCET) 7.5-325 MG tablet 42 tablet  °     [name], [place] °

## 2020-01-01 ENCOUNTER — Encounter: Payer: Self-pay | Admitting: Gastroenterology

## 2020-01-05 ENCOUNTER — Other Ambulatory Visit (HOSPITAL_COMMUNITY): Payer: Self-pay | Admitting: Psychiatry

## 2020-01-05 ENCOUNTER — Other Ambulatory Visit: Payer: Self-pay | Admitting: Nurse Practitioner

## 2020-01-05 DIAGNOSIS — F3130 Bipolar disorder, current episode depressed, mild or moderate severity, unspecified: Secondary | ICD-10-CM

## 2020-01-05 DIAGNOSIS — F5105 Insomnia due to other mental disorder: Secondary | ICD-10-CM

## 2020-01-05 DIAGNOSIS — K219 Gastro-esophageal reflux disease without esophagitis: Secondary | ICD-10-CM

## 2020-01-13 ENCOUNTER — Other Ambulatory Visit: Payer: Self-pay | Admitting: Orthopedic Surgery

## 2020-01-13 DIAGNOSIS — G894 Chronic pain syndrome: Secondary | ICD-10-CM

## 2020-01-20 ENCOUNTER — Other Ambulatory Visit: Payer: Self-pay | Admitting: Orthopedic Surgery

## 2020-01-20 DIAGNOSIS — G894 Chronic pain syndrome: Secondary | ICD-10-CM

## 2020-01-20 MED ORDER — OXYCODONE-ACETAMINOPHEN 7.5-325 MG PO TABS: ORAL_TABLET | ORAL | 0 refills | Status: DC

## 2020-01-20 NOTE — Telephone Encounter (Signed)
Patient requests refill on Oxycodone/Acetaminophen 7.5-325  Mgs.  Qty  15  Sig: Take 1-2 tablets by mouth every 6 (six) hours as needed.       Patient states she uses [name] in [place]

## 2020-01-22 ENCOUNTER — Other Ambulatory Visit: Payer: Self-pay | Admitting: Orthopedic Surgery

## 2020-01-22 DIAGNOSIS — E559 Vitamin D deficiency, unspecified: Secondary | ICD-10-CM | POA: Diagnosis not present

## 2020-01-22 DIAGNOSIS — K219 Gastro-esophageal reflux disease without esophagitis: Secondary | ICD-10-CM | POA: Diagnosis not present

## 2020-01-22 DIAGNOSIS — G894 Chronic pain syndrome: Secondary | ICD-10-CM

## 2020-01-22 DIAGNOSIS — I10 Essential (primary) hypertension: Secondary | ICD-10-CM | POA: Diagnosis not present

## 2020-01-22 DIAGNOSIS — E039 Hypothyroidism, unspecified: Secondary | ICD-10-CM | POA: Diagnosis not present

## 2020-01-22 DIAGNOSIS — Z0001 Encounter for general adult medical examination with abnormal findings: Secondary | ICD-10-CM | POA: Diagnosis not present

## 2020-01-22 DIAGNOSIS — Z1389 Encounter for screening for other disorder: Secondary | ICD-10-CM | POA: Diagnosis not present

## 2020-01-22 MED ORDER — OXYCODONE-ACETAMINOPHEN 7.5-325 MG PO TABS: ORAL_TABLET | ORAL | 0 refills | Status: DC

## 2020-01-22 NOTE — Telephone Encounter (Signed)
Oxycodone/Acetaminophen 7.5-325  Mgs.  Was filled on 01/20/20 for 15 pills and this was an incorrect number of pills.  The quantity for this prescription should have been 42 pills  Can we send another prescription to [name] in [place] for this patient?  Thanks

## 2020-01-28 ENCOUNTER — Telehealth (HOSPITAL_COMMUNITY): Payer: Self-pay | Admitting: Psychiatry

## 2020-01-28 ENCOUNTER — Other Ambulatory Visit: Payer: Self-pay

## 2020-01-28 ENCOUNTER — Telehealth (HOSPITAL_COMMUNITY): Payer: Medicare Other | Admitting: Psychiatry

## 2020-01-28 DIAGNOSIS — G894 Chronic pain syndrome: Secondary | ICD-10-CM

## 2020-01-28 DIAGNOSIS — Z91199 Patient's noncompliance with other medical treatment and regimen due to unspecified reason: Secondary | ICD-10-CM

## 2020-01-29 MED ORDER — OXYCODONE-ACETAMINOPHEN 7.5-325 MG PO TABS: ORAL_TABLET | ORAL | 0 refills | Status: DC

## 2020-02-04 ENCOUNTER — Other Ambulatory Visit: Payer: Self-pay | Admitting: Gastroenterology

## 2020-02-07 ENCOUNTER — Other Ambulatory Visit: Payer: Self-pay | Admitting: Orthopedic Surgery

## 2020-02-07 DIAGNOSIS — G894 Chronic pain syndrome: Secondary | ICD-10-CM

## 2020-02-16 IMAGING — MG DIGITAL DIAGNOSTIC BILATERAL MAMMOGRAM WITH TOMO AND CAD
6 of 10 series · 6 of 30 positions shown · non-contrast
Comparison: Previous exam(s).

CLINICAL DATA: 59-year-old female with a left breast palpable
abnormality.

EXAM:
DIGITAL DIAGNOSTIC BILATERAL MAMMOGRAM WITH CAD AND TOMO
LEFT BREAST ULTRASOUND

[L TAN synth-2D]
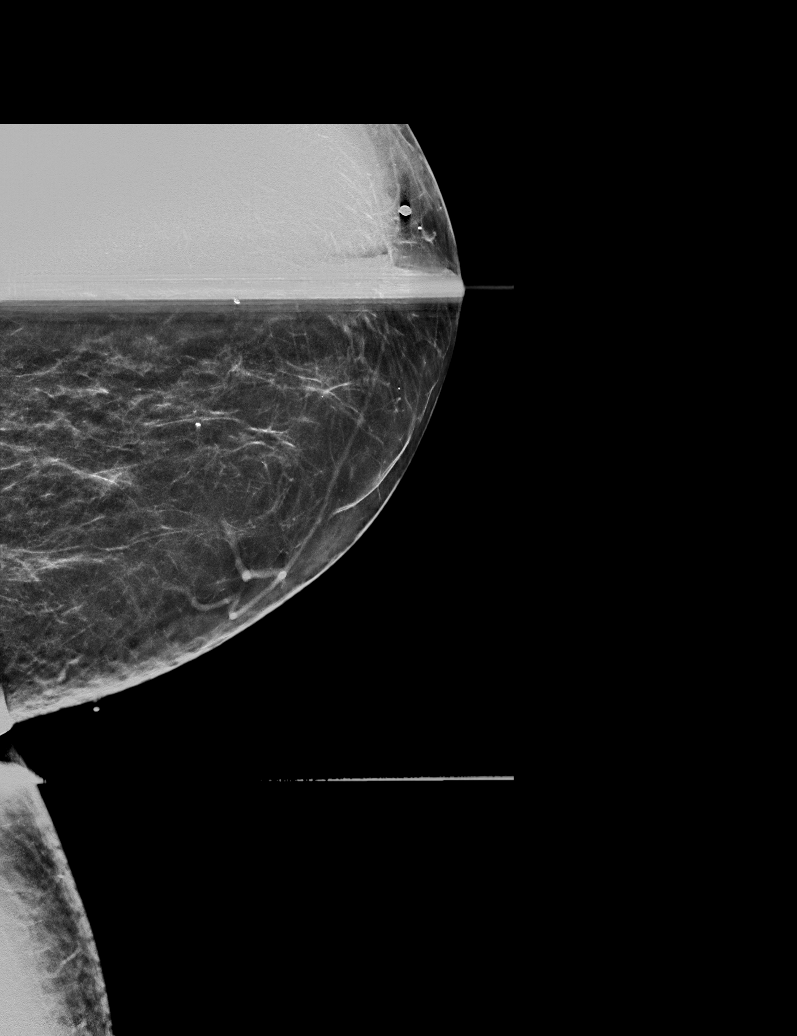

[L CC synth-2D]
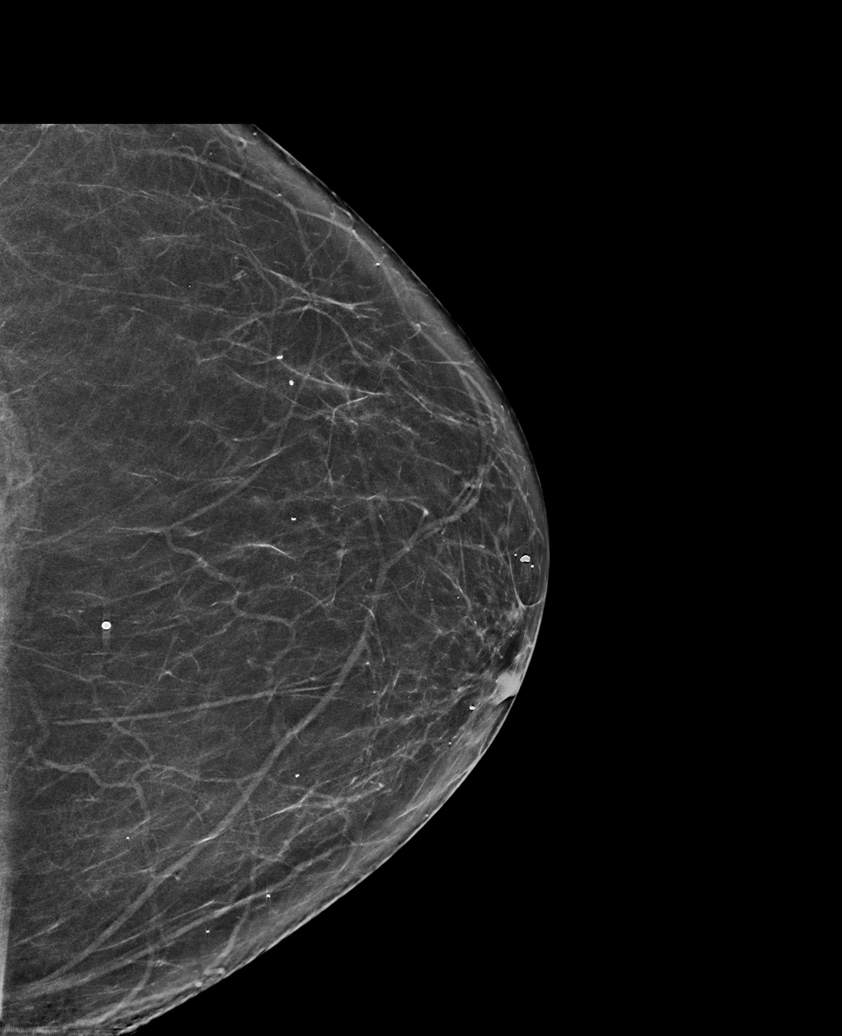

[R MLO synth-2D]
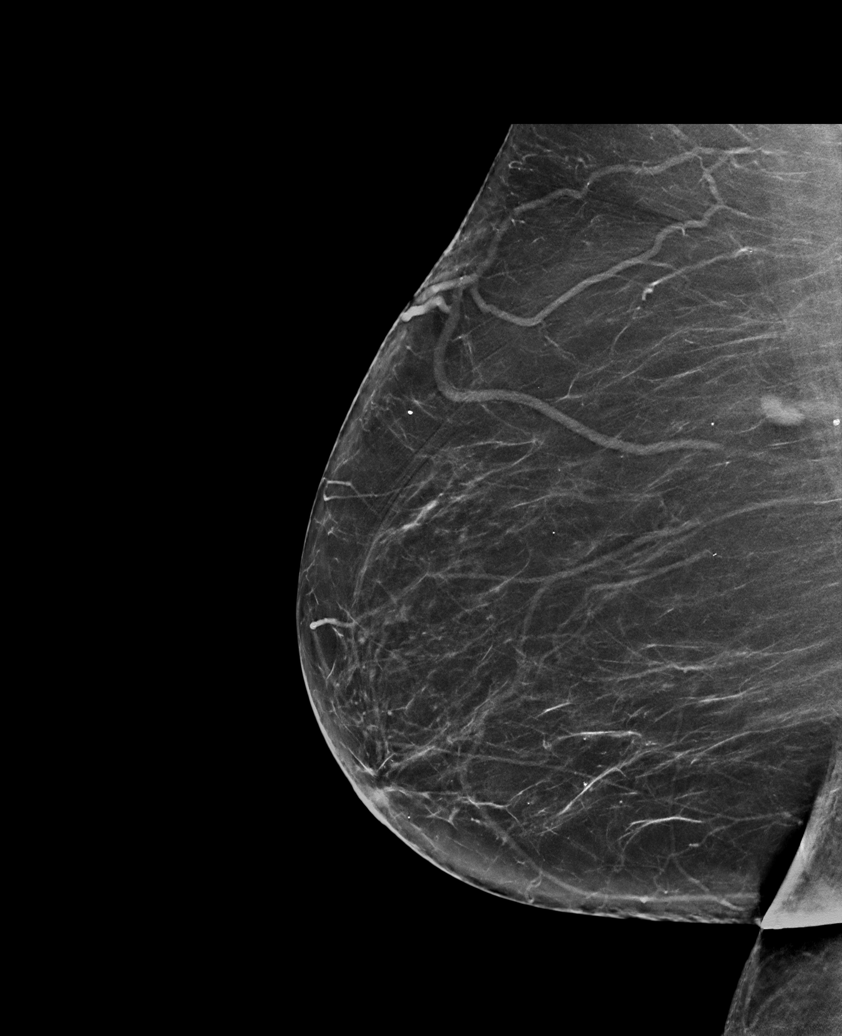

[L MLO synth-2D]
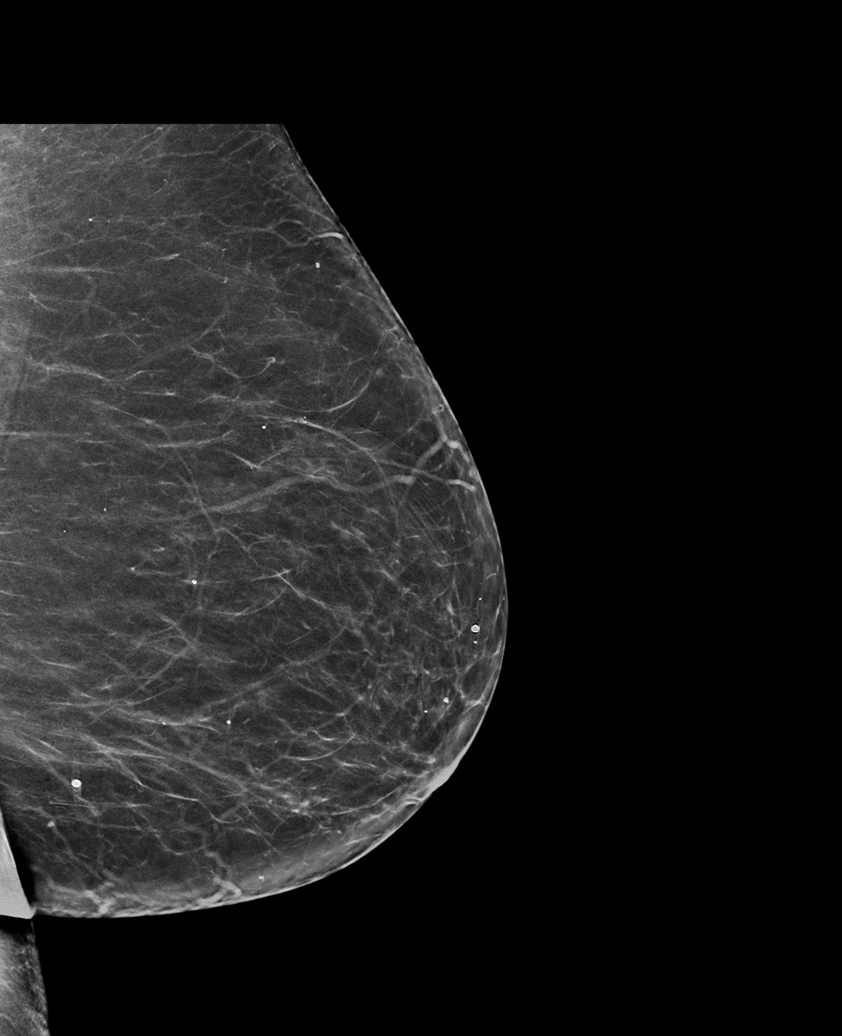

[R CC synth-2D]
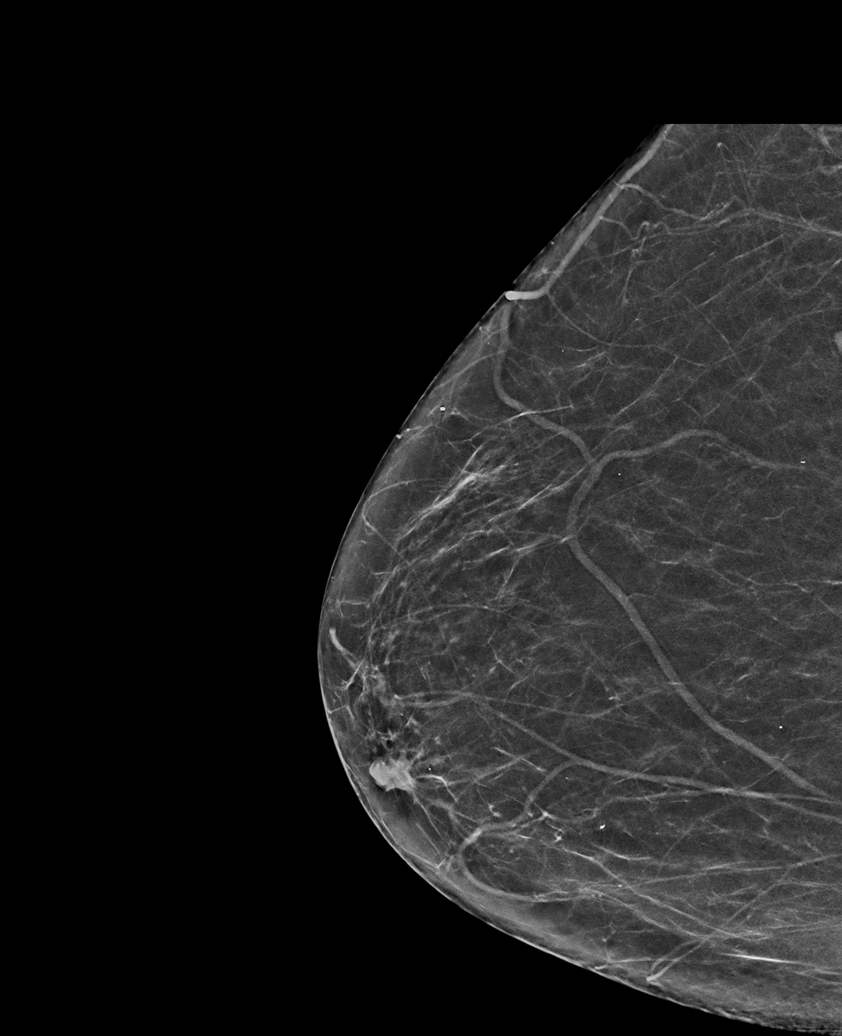

[L MLO tomo · tomo slice 36/71.0]
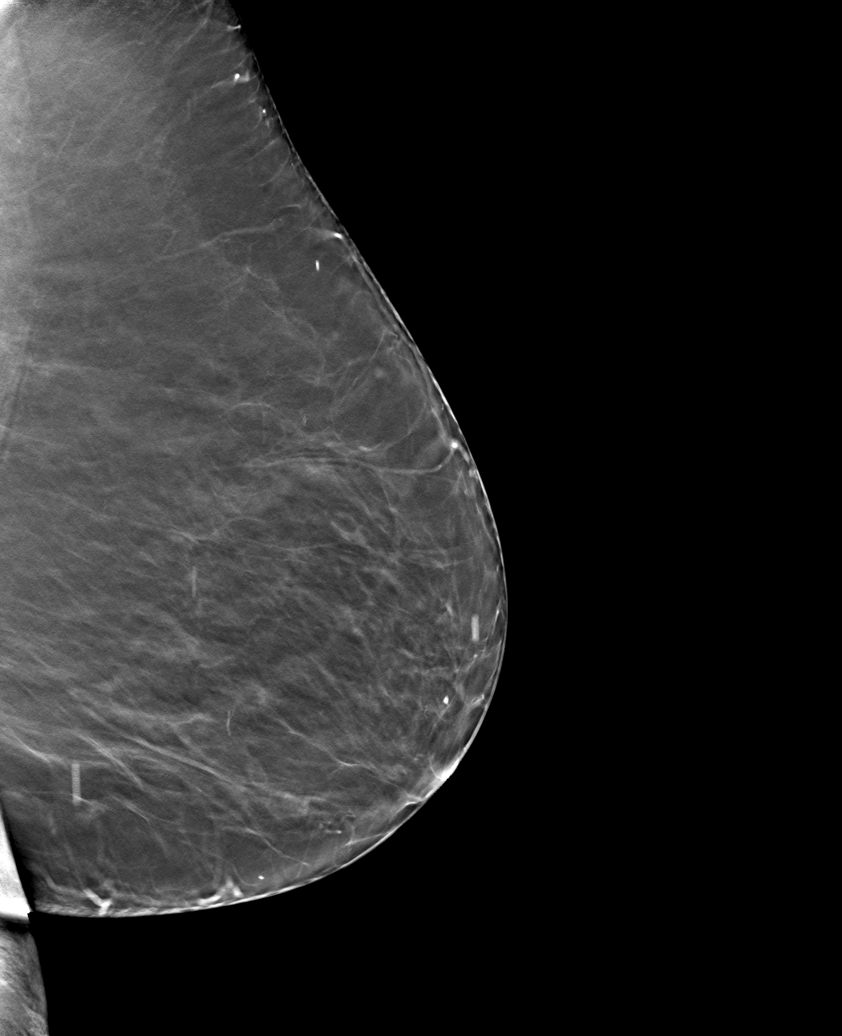

[6 of 30 positions shown; findings below may reference images not displayed]

ACR Breast Density Category b: There are scattered areas of
fibroglandular density.
FINDINGS: No suspicious masses or calcifications are seen in either breast.
Spot compression tangential tomograms were performed over the
palpable area of concern in the left breast with no abnormalities
identified.

Mammographic images were processed with CAD.

Physical examination at site of palpable concern reveals linear
oriented thickening corresponding to the inframammary fold. No
underlying palpable masses.

Targeted ultrasound of the left breast was performed. No suspicious
masses or abnormalities.
IMPRESSION: 1.  No findings of malignancy in either breast.

2. No abnormalities identified at site of palpable concern in the
lower left breast. The palpable abnormality is likely related to the
inframammary fold in this location.

RECOMMENDATION:
1. Recommend further management of the left breast palpable
abnormality be based on clinical assessment.

2.  Screening mammogram in one year.(Code:[id number]-A-2NY)

I have discussed the findings and recommendations with the patient.
Results were also provided in writing at the conclusion of the
visit. If applicable, a reminder letter will be sent to the patient
regarding the next appointment.

BI-RADS CATEGORY  1: Negative.

## 2020-02-17 ENCOUNTER — Other Ambulatory Visit: Payer: Self-pay | Admitting: Orthopedic Surgery

## 2020-02-17 DIAGNOSIS — G894 Chronic pain syndrome: Secondary | ICD-10-CM

## 2020-02-17 MED ORDER — OXYCODONE-ACETAMINOPHEN 7.5-325 MG PO TABS: ORAL_TABLET | ORAL | 0 refills | Status: DC

## 2020-02-17 NOTE — Telephone Encounter (Signed)
Patient requests refill: oxyCODONE-acetaminophen (PERCOCET) 7.5-325 MG tablet -[name], [place]

## 2020-02-18 ENCOUNTER — Emergency Department (HOSPITAL_COMMUNITY): Payer: Medicare Other

## 2020-02-18 ENCOUNTER — Other Ambulatory Visit: Payer: Self-pay

## 2020-02-18 ENCOUNTER — Observation Stay (HOSPITAL_COMMUNITY)
Admission: EM | Admit: 2020-02-18 | Discharge: 2020-02-19 | Disposition: A | Discharge status: Home / Self Care | Payer: Medicare Other | Attending: Family Medicine | Admitting: Family Medicine

## 2020-02-18 ENCOUNTER — Observation Stay (HOSPITAL_COMMUNITY): Payer: Medicare Other

## 2020-02-18 ENCOUNTER — Encounter (HOSPITAL_COMMUNITY): Payer: Self-pay

## 2020-02-18 DIAGNOSIS — Z96651 Presence of right artificial knee joint: Secondary | ICD-10-CM | POA: Diagnosis not present

## 2020-02-18 DIAGNOSIS — N289 Disorder of kidney and ureter, unspecified: Secondary | ICD-10-CM

## 2020-02-18 DIAGNOSIS — R0689 Other abnormalities of breathing: Secondary | ICD-10-CM | POA: Diagnosis not present

## 2020-02-18 DIAGNOSIS — R42 Dizziness and giddiness: Secondary | ICD-10-CM | POA: Diagnosis not present

## 2020-02-18 DIAGNOSIS — I10 Essential (primary) hypertension: Secondary | ICD-10-CM | POA: Insufficient documentation

## 2020-02-18 DIAGNOSIS — Z87891 Personal history of nicotine dependence: Secondary | ICD-10-CM | POA: Insufficient documentation

## 2020-02-18 DIAGNOSIS — R52 Pain, unspecified: Secondary | ICD-10-CM | POA: Diagnosis not present

## 2020-02-18 DIAGNOSIS — E785 Hyperlipidemia, unspecified: Secondary | ICD-10-CM | POA: Diagnosis present

## 2020-02-18 DIAGNOSIS — M549 Dorsalgia, unspecified: Secondary | ICD-10-CM | POA: Diagnosis present

## 2020-02-18 DIAGNOSIS — E7439 Other disorders of intestinal carbohydrate absorption: Secondary | ICD-10-CM | POA: Diagnosis present

## 2020-02-18 DIAGNOSIS — R269 Unspecified abnormalities of gait and mobility: Secondary | ICD-10-CM | POA: Diagnosis not present

## 2020-02-18 DIAGNOSIS — R531 Weakness: Secondary | ICD-10-CM | POA: Diagnosis not present

## 2020-02-18 DIAGNOSIS — G8929 Other chronic pain: Secondary | ICD-10-CM | POA: Diagnosis present

## 2020-02-18 DIAGNOSIS — R9431 Abnormal electrocardiogram [ECG] [EKG]: Secondary | ICD-10-CM | POA: Diagnosis present

## 2020-02-18 DIAGNOSIS — Z20822 Contact with and (suspected) exposure to covid-19: Secondary | ICD-10-CM | POA: Diagnosis not present

## 2020-02-18 DIAGNOSIS — E039 Hypothyroidism, unspecified: Secondary | ICD-10-CM | POA: Diagnosis not present

## 2020-02-18 DIAGNOSIS — R2689 Other abnormalities of gait and mobility: Secondary | ICD-10-CM | POA: Diagnosis not present

## 2020-02-18 DIAGNOSIS — S199XXA Unspecified injury of neck, initial encounter: Secondary | ICD-10-CM | POA: Diagnosis not present

## 2020-02-18 DIAGNOSIS — K219 Gastro-esophageal reflux disease without esophagitis: Secondary | ICD-10-CM | POA: Diagnosis present

## 2020-02-18 DIAGNOSIS — F418 Other specified anxiety disorders: Secondary | ICD-10-CM | POA: Diagnosis present

## 2020-02-18 DIAGNOSIS — R55 Syncope and collapse: Principal | ICD-10-CM

## 2020-02-18 DIAGNOSIS — D649 Anemia, unspecified: Secondary | ICD-10-CM | POA: Diagnosis present

## 2020-02-18 DIAGNOSIS — I959 Hypotension, unspecified: Secondary | ICD-10-CM | POA: Diagnosis not present

## 2020-02-18 DIAGNOSIS — R4020 Unspecified coma: Secondary | ICD-10-CM | POA: Diagnosis not present

## 2020-02-18 DIAGNOSIS — R519 Headache, unspecified: Secondary | ICD-10-CM | POA: Diagnosis not present

## 2020-02-18 DIAGNOSIS — Z743 Need for continuous supervision: Secondary | ICD-10-CM | POA: Diagnosis not present

## 2020-02-18 DIAGNOSIS — K5900 Constipation, unspecified: Secondary | ICD-10-CM | POA: Diagnosis present

## 2020-02-18 LAB — CBC WITH DIFFERENTIAL/PLATELET
Abs Immature Granulocytes: 0.03 10*3/uL (ref 0.00–0.07)
Basophils Absolute: 0 10*3/uL (ref 0.0–0.1)
Basophils Relative: 0 %
Eosinophils Absolute: 0 10*3/uL (ref 0.0–0.5)
Eosinophils Relative: 0 %
HCT: 35.1 % — ABNORMAL LOW (ref 36.0–46.0)
Hemoglobin: 11.1 g/dL — ABNORMAL LOW (ref 12.0–15.0)
Immature Granulocytes: 1 %
Lymphocytes Relative: 36 %
Lymphs Abs: 2.2 10*3/uL (ref 0.7–4.0)
MCH: 29 pg (ref 26.0–34.0)
MCHC: 31.6 g/dL (ref 30.0–36.0)
MCV: 91.6 fL (ref 80.0–100.0)
Monocytes Absolute: 0.7 10*3/uL (ref 0.1–1.0)
Monocytes Relative: 12 %
Neutro Abs: 3.2 10*3/uL (ref 1.7–7.7)
Neutrophils Relative %: 51 %
Platelets: 163 10*3/uL (ref 150–400)
RBC: 3.83 MIL/uL — ABNORMAL LOW (ref 3.87–5.11)
RDW: 14 % (ref 11.5–15.5)
WBC: 6.3 10*3/uL (ref 4.0–10.5)
nRBC: 0 % (ref 0.0–0.2)

## 2020-02-18 LAB — URINALYSIS, ROUTINE W REFLEX MICROSCOPIC
Glucose, UA: NEGATIVE mg/dL
Hgb urine dipstick: NEGATIVE
Ketones, ur: NEGATIVE mg/dL
Nitrite: POSITIVE — AB
Protein, ur: NEGATIVE mg/dL
Specific Gravity, Urine: 1.017 (ref 1.005–1.030)
pH: 5 (ref 5.0–8.0)

## 2020-02-18 LAB — COMPREHENSIVE METABOLIC PANEL
ALT: 11 U/L (ref 0–44)
AST: 14 U/L — ABNORMAL LOW (ref 15–41)
Albumin: 3.5 g/dL (ref 3.5–5.0)
Alkaline Phosphatase: 45 U/L (ref 38–126)
Anion gap: 9 (ref 5–15)
BUN: 16 mg/dL (ref 8–23)
CO2: 27 mmol/L (ref 22–32)
Calcium: 8.6 mg/dL — ABNORMAL LOW (ref 8.9–10.3)
Chloride: 103 mmol/L (ref 98–111)
Creatinine, Ser: 1.25 mg/dL — ABNORMAL HIGH (ref 0.44–1.00)
GFR calc Af Amer: 51 mL/min — ABNORMAL LOW (ref >60)
GFR calc non Af Amer: 44 mL/min — ABNORMAL LOW (ref >60)
Glucose, Bld: 85 mg/dL (ref 70–99)
Potassium: 3.5 mmol/L (ref 3.5–5.1)
Sodium: 139 mmol/L (ref 135–145)
Total Bilirubin: 0.7 mg/dL (ref 0.3–1.2)
Total Protein: 6.6 g/dL (ref 6.5–8.1)

## 2020-02-18 LAB — SALICYLATE LEVEL: Salicylate Lvl: <7 mg/dL — ABNORMAL LOW (ref 7.0–30.0)

## 2020-02-18 LAB — MAGNESIUM: Magnesium: 1.7 mg/dL (ref 1.7–2.4)

## 2020-02-18 LAB — ETHANOL: Alcohol, Ethyl (B): <10 mg/dL (ref <10)

## 2020-02-18 LAB — TSH: TSH: 7.105 u[IU]/mL — ABNORMAL HIGH (ref 0.350–4.500)

## 2020-02-18 LAB — SARS CORONAVIRUS 2 BY RT PCR (HOSPITAL ORDER, PERFORMED IN ~~LOC~~ HOSPITAL LAB): SARS Coronavirus 2: NEGATIVE

## 2020-02-18 MED ORDER — MAGNESIUM SULFATE 2 GM/50ML IV SOLN
2.0000 g | Freq: Once | INTRAVENOUS | Status: AC
  Administered 2020-02-19: 2 g via INTRAVENOUS
  Filled 2020-02-18: qty 50

## 2020-02-18 MED ORDER — ACETAMINOPHEN 650 MG RE SUPP: 650.0000 mg | Freq: Four times a day (QID) | RECTAL | Status: DC | PRN

## 2020-02-18 MED ORDER — SODIUM CHLORIDE 0.9% FLUSH
3.0000 mL | Freq: Two times a day (BID) | INTRAVENOUS | Status: DC
  Administered 2020-02-19 (×2): 3 mL via INTRAVENOUS

## 2020-02-18 MED ORDER — SODIUM CHLORIDE 0.9 % IV BOLUS
500.0000 mL | Freq: Once | INTRAVENOUS | Status: AC
  Administered 2020-02-18: 500 mL via INTRAVENOUS

## 2020-02-18 MED ORDER — ACETAMINOPHEN 325 MG PO TABS
650.0000 mg | ORAL_TABLET | Freq: Four times a day (QID) | ORAL | Status: DC | PRN
  Administered 2020-02-19: 650 mg via ORAL
  Filled 2020-02-18: qty 2

## 2020-02-18 MED ORDER — PROCHLORPERAZINE EDISYLATE 10 MG/2ML IJ SOLN: 5.0000 mg | INTRAMUSCULAR | Status: DC | PRN

## 2020-02-18 MED ORDER — SODIUM CHLORIDE 0.9 % IV BOLUS: 500.0000 mL | Freq: Once | INTRAVENOUS | Status: AC

## 2020-02-18 MED ORDER — POTASSIUM CHLORIDE CRYS ER 20 MEQ PO TBCR
20.0000 meq | EXTENDED_RELEASE_TABLET | Freq: Once | ORAL | Status: AC
  Administered 2020-02-19: 20 meq via ORAL
  Filled 2020-02-18: qty 1

## 2020-02-18 NOTE — H&P (Signed)
History and Physical    Diane [name] BOF:[id number] DOB: 12/18/1950 DOA: 02/18/2020  PCP: Diane School, MD  Patient coming from: Home.  I have personally briefly reviewed patient's old medical records in [facility]  Chief Complaint: Weakness and dizziness.  HPI: Diane [name] is a 69 y.o. female with medical history significant of allergic rhinitis, anxiety, depression, PTSD, mania, osteoarthritis, bilateral cataracts, chronic back pain, constipation, GERD, hyperlipidemia, hypertension, hypothyroidism, sleep apnea, substance abuse in remission who is coming to the emergency department after complaining of having progressively worse weakness and dizziness for the past 6 days.  She denies chest pain, palpitations, dyspnea, diaphoresis, PND, orthopnea or pitting edema of the lower extremities.  She states that her appetite is decreased, but denies fever, chills, rhinorrhea, sore throat, wheezing or hemoptysis.  No abdominal pain, nausea, vomiting, diarrhea, constipation, melena or hematochezia.  No dysuria, frequency or materia.  No polyuria, polydipsia, polyphagia or blurred vision.  ED Course: Initial vital signs were temperature 98.8 F, pulse 80, respiration 18, blood pressure 127/60 mmHg and O2 sat 91% on room air. The patient was given 2 500 mL NS boluses in the emergency department. She still felt lightheaded after the fluids.  Urinalysis shows positive nitrites, small leukocyte esterase, few bacteria with 0-5 RBC and 0-5 WBC on microscopic examination. Her CBC showed a white count 6.3, hemoglobin 11.1 g/dL and platelets 163. Alcohol and salicylate levels were normal. CMP shows normal electrolytes when calcium is corrected to albumin. Creatinine was 1.25 mg/dL and AST 14 units/L. The rest of the CMP measurements are within expected range. TSH was 7.105. EKG was sinus rhythm and with abnormal R wave progression, nonspecific T abnormalities and prolonged QT interval  Imaging: Her chest  radiograph showed a. CT head and MRI brain did not show any acute intracranial abnormality. CT cervical spine was unremarkable. There was no acute fracture identified. Please see images and full radiology report for further detail.  Review of Systems: As per HPI otherwise all other systems reviewed and are negative.  Past Medical History:  Diagnosis Date  . Allergic rhinitis   . Anxiety   . Arthritis   . Cataracts, bilateral   . Chronic back pain    Sees pain mamagement clinic  . Chronic pain syndrome   . Constipation   . Depression   . GERD (gastroesophageal reflux disease)   . Headaches, cluster 3 yrs ago   last one  . High cholesterol   . Hypertension   . Hypothyroid   . Immature cataract   . Limp    right leg  . Mania ([place])   . PTSD (post-traumatic stress disorder)   . Right lumbar radiculopathy   . Seizures ([place]) 20 yrs ago   unknown etiology-on meds  . Sleep apnea    STOP BANG score=4  . 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[facility])     Past Surgical History:  Procedure Laterality Date  . BACK SURGERY    . CATARACT EXTRACTION W/PHACO Left 01/04/2016   Procedure: CATARACT EXTRACTION PHACO AND INTRAOCULAR LENS PLACEMENT (IOC);  Surgeon: Diane Guys, MD;  Location: [facility];  Service: Ophthalmology;  Laterality: Left;  CDE:4.70  . CATARACT EXTRACTION W/PHACO Right 01/18/2016   Procedure: CATARACT EXTRACTION PHACO AND INTRAOCULAR LENS PLACEMENT RIGHT EYE; CDE:  4.66;  Surgeon: Diane Guys, MD;  Location: [facility];  Service: Ophthalmology;  Laterality: Right;  . CHOLECYSTECTOMY    . COLONOSCOPY  10/16/2008   DPO:EUMPNTIRWER, otherwise normal rectum; pancolonic diverticula the  remainder of the colonic mucosa appeared normal. Next TCS due 10/2013.  Marland Kitchen. COLONOSCOPY  06/28/2006   RMR: Internal hemorrhoids. Diminutive rectal polyps, cold biopsied/ Pancolonic diverticula. Polyps in the right colon removed   . COLONOSCOPY WITH PROPOFOL N/A 05/20/2015   RMR: Pancolonic diverticulosis. Redundant  colon   . ESOPHAGOGASTRODUODENOSCOPY  10/16/2008   BMW:[id number]:Small hiatal hernia otherwise normal esophagus, stomach  D1, D2, status post passage of a 56-French Maloney dilator  . ESOPHAGOGASTRODUODENOSCOPY  06/28/2006   MWN:[id number]:Normal esophagus. Small hiatal hernia. Otherwise normal stomach, D1 and D2, status post passage of a 56 [place] Maloney dilator  . ESOPHAGOGASTRODUODENOSCOPY (EGD) WITH ESOPHAGEAL DILATION N/A 07/14/2013   RMR: Abnormal esophagus of uncertain significance-status post esophageal biospy. small hiatal hernia.   Marland Kitchen. ESOPHAGOGASTRODUODENOSCOPY (EGD) WITH PROPOFOL N/A 05/20/2015   RMR: NORMAL EGD status post maloney dilation   . ESOPHAGOGASTRODUODENOSCOPY (EGD) WITH PROPOFOL N/A 12/23/2018   Dr. Jena Gaussrourk: normal esophagus, s/p esophageal dilation.   Marland Kitchen. FOOT SURGERY     left foot-  . INCISION AND DRAINAGE Right 01/03/2013   Procedure: INCISION AND DRAINAGE;  Surgeon: Diane HearingStanley E Harrison, MD;  Location: [facility];  Service: Orthopedics;  Laterality: Right;  . KNEE ARTHROSCOPY  01/2012   right  . KNEE ARTHROSCOPY WITH LATERAL RELEASE Right 01/03/2013   Procedure: Lateral Release Patella Right Knee;  Surgeon: Diane HearingStanley E Harrison, MD;  Location: [facility];  Service: Orthopedics;  Laterality: Right;  . LAPAROSCOPIC LYSIS OF ADHESIONS N/A 11/12/2019   Procedure: LAPAROSCOPIC LYSIS OF ADHESIONS, extensive; drainage of peritoneal cyst;  Surgeon: Diane [name], Luther H, MD;  Location: [facility];  Service: Gynecology;  Laterality: N/A;  . left foot  x 2  . LUMBAR DISC SURGERY  L4-5  . MALONEY DILATION N/A 05/20/2015   Procedure: Elease HashimotoMALONEY DILATION;  Surgeon: Diane Adeobert M Rourk, MD;  Location: [facility];  Service: Endoscopy;  Laterality: N/A;  Maloney dilator # [id number]  . MALONEY DILATION N/A 12/23/2018   Procedure: Elease HashimotoMALONEY DILATION;  Surgeon: Diane Adeourk, Robert M, MD;  Location: [facility];  Service: Endoscopy;  Laterality: N/A;  . oophoectomy  bilateral  . PARTIAL HYSTERECTOMY    . TOTAL KNEE ARTHROPLASTY  07/01/2012   Procedure: TOTAL KNEE  ARTHROPLASTY;  Surgeon: Diane HearingStanley E Harrison, MD;  Location: [facility];  Service: Orthopedics;  Laterality: Right;  . TOTAL KNEE REVISION Right 01/03/2013   Procedure: Patellaplasty Right Knee;  Surgeon: Diane HearingStanley E Harrison, MD;  Location: [facility];  Service: Orthopedics;  Laterality: Right;    Social History  reports that she quit smoking about 16 years ago. Her smoking use included cigarettes. She has a 1.25 pack-year smoking history. She has never used smokeless tobacco. She reports current alcohol use. She reports previous drug use.  Allergies  Allergen Reactions  . Neomycin-Bacitracin Zn-Polymyx Other (See Comments)    Reaction: skin starts to become raw and peels off  . Penicillins Hives  . Sulfonamide Derivatives Hives and Swelling    Family History  Problem Relation Age of Onset  . Alcohol abuse Mother   . Anxiety disorder Mother   . Alcohol abuse Father   . Anxiety disorder Father   . Bipolar disorder Sister   . Alcohol abuse Brother   . Anxiety disorder Brother   . Drug abuse Brother   . Dementia Paternal Aunt   . ADD / ADHD Grandchild   . ADD / ADHD Grandchild   . Drug abuse Brother   . Alcohol abuse Brother   . Anxiety disorder Brother   .  Seizures Brother   . Alcohol abuse Brother   . Anxiety disorder Brother   . Seizures Brother   . Alcohol abuse Brother   . Anxiety disorder Brother   . Alcohol abuse Brother   . Anxiety disorder Brother   . Heart disease Other   . Diabetes Other   . Alcohol abuse Other   . Depression Neg Hx   . OCD Neg Hx   . Paranoid behavior Neg Hx   . Schizophrenia Neg Hx   . Sexual abuse Neg Hx   . Physical abuse Neg Hx   . Colon cancer Neg Hx    Prior to Admission medications   Medication Sig Start Date End Date Taking? Authorizing Provider  ALPRAZolam (XANAX) 0.5 MG tablet TAKE ONE TABLET BY MOUTH TWICE DAILY AND TWO TABLETS AT BEDTIME. Patient taking differently: Take 0.5 mg by mouth See admin instructions. Take one tablet by mouth  twice daily and take two tablets at bedtime 01/06/20  Yes Myrlene Broker, MD  atorvastatin (LIPITOR) 20 MG tablet Take 20 mg by mouth daily.   Yes [provider]  cyclobenzaprine (FLEXERIL) 10 MG tablet Take 1 tablet (10 mg total) by mouth 2 (two) times daily as needed for muscle spasms. 11/06/19  Yes Diane Hearing, MD  divalproex (DEPAKOTE) 250 MG DR tablet TAKE THREE (3) TABLETS BY MOUTH AT BEDTIME. Patient taking differently: Take 750 mg by mouth at bedtime.  01/06/20  Yes Myrlene Broker, MD  FLUoxetine (PROZAC) 20 MG capsule TAKE ONE CAPSULE BY MOUTH TWICE DAILY. Patient taking differently: Take 20 mg by mouth 2 (two) times daily.  01/06/20  Yes Myrlene Broker, MD  hydrocortisone (ANUSOL-HC) 2.5 % rectal cream Place 1 application rectally 2 (two) times daily. 02/06/18  Yes Gelene Mink, NP  levothyroxine (SYNTHROID, LEVOTHROID) 150 MCG tablet Take 150 mcg by mouth daily before breakfast.  03/29/17  Yes [provider]  lisinopril (PRINIVIL,ZESTRIL) 10 MG tablet Take 10 mg by mouth at bedtime.    Yes [provider]  lubiprostone (AMITIZA) 24 MCG capsule TAKE ONE CAPSULE BY MOUTH TWICE DAILY. TAKE WITH A MEAL. Patient taking differently: Take 24 mcg by mouth 2 (two) times daily with a meal.  02/04/20  Yes Clearance Coots, Kristen S, PA-C  meloxicam (MOBIC) 7.5 MG tablet Take 1 tablet (7.5 mg total) by mouth daily. 11/06/19  Yes Diane Hearing, MD  oxyCODONE-acetaminophen (PERCOCET) 7.5-325 MG tablet One q 6 hrs prn pain Patient taking differently: Take 1 tablet by mouth every 6 (six) hours as needed for moderate pain or severe pain.  02/17/20  Yes Diane Hearing, MD  pantoprazole (PROTONIX) 40 MG tablet TAKE ONE TABLET BY MOUTH TWICE DAILY. Patient taking differently: Take 40 mg by mouth 2 (two) times daily.  01/08/20  Yes Harper, Kristen S, PA-C  SYMPROIC 0.2 MG TABS TAKE ONE TABLET BY MOUTH DAILY FOR CONSTIPATION ON DAYS YOU TAKE YOUR PAIN MEDICATION. Patient taking  differently: Take 0.2 mg by mouth daily as needed (when taking oxycodone).  06/12/19  Yes Tiffany Kocher, PA-C  traZODone (DESYREL) 100 MG tablet TAKE TWO (2) TABLETS BY MOUTH AT BEDTIME. Patient taking differently: Take 200 mg by mouth at bedtime.  01/05/20  Yes Myrlene Broker, MD  ketorolac (TORADOL) 10 MG tablet Take 1 tablet (10 mg total) by mouth every 8 (eight) hours as needed. Patient not taking: Reported on 02/18/2020 11/12/19   Diane Arms, MD  ondansetron [facility] ODT) 8  MG disintegrating tablet Take 1 tablet (8 mg total) by mouth every 8 (eight) hours as needed for nausea or vomiting. Patient not taking: Reported on 02/18/2020 11/12/19   Diane Arms, MD  oxyCODONE-acetaminophen (PERCOCET) 7.5-325 MG tablet Take 1-2 tablets by mouth every 6 (six) hours as needed. Patient not taking: Reported on 02/18/2020 11/14/19   Diane Hearing, MD    Physical Exam: Vitals:   02/18/20 1931 02/18/20 2036 02/18/20 2100 02/18/20 2130  BP: 139/60 (!) 139/57 (!) 143/73 (!) 149/71  Pulse: 73 65 68 68  Resp: (!) 21 19 18 17   Temp:      TempSrc:      SpO2: 99% 95% 99% 99%  Weight:      Height:       Constitutional: NAD, calm, comfortable Eyes: PERRL, lids and conjunctivae normal ENMT: Mucous membranes are mildly dry. Posterior pharynx clear of any exudate or lesions. Neck: normal, supple, no masses, no thyromegaly Respiratory: clear to auscultation bilaterally, no wheezing, no crackles. Normal respiratory effort. No accessory muscle use.  Cardiovascular: Regular rate and rhythm, no murmurs / rubs / gallops. No extremity edema. 2+ pedal pulses. No carotid bruits.  Abdomen: Obese, nondistended.  BS positive.  Soft, no tenderness, no masses palpated. No hepatosplenomegaly. Musculoskeletal: no clubbing / cyanosis. Good ROM, no contractures. Normal muscle tone.  Skin: no rashes, lesions, ulcers on limited dermatological examination. Neurologic: CN 2-12 grossly intact. Sensation intact, DTR normal.  Strength 5/5 in all 4.  Psychiatric: Normal judgment and insight. Alert and oriented x 3. Normal mood.   Labs on Admission: I have personally reviewed following labs and imaging studies  CBC: Recent Labs  Lab 02/18/20 1721  WBC 6.3  NEUTROABS 3.2  HGB 11.1*  HCT 35.1*  MCV 91.6  PLT 163    Basic Metabolic Panel: Recent Labs  Lab 02/18/20 1721  NA 139  K 3.5  CL 103  CO2 27  GLUCOSE 85  BUN 16  CREATININE 1.25*  CALCIUM 8.6*  MG 1.7    GFR: Estimated Creatinine Clearance: 45.7 mL/min (A) (by C-G formula based on SCr of 1.25 mg/dL (H)).  Liver Function Tests: Recent Labs  Lab 02/18/20 1721  AST 14*  ALT 11  ALKPHOS 45  BILITOT 0.7  PROT 6.6  ALBUMIN 3.5    Urine analysis:    Component Value Date/Time   COLORURINE YELLOW 02/18/2020 1930   APPEARANCEUR HAZY (A) 02/18/2020 1930   LABSPEC 1.017 02/18/2020 1930   PHURINE 5.0 02/18/2020 1930   GLUCOSEU NEGATIVE 02/18/2020 1930   HGBUR NEGATIVE 02/18/2020 1930   HGBUR negative 01/10/2010 1059   BILIRUBINUR SMALL (A) 02/18/2020 1930   KETONESUR NEGATIVE 02/18/2020 1930   PROTEINUR NEGATIVE 02/18/2020 1930   UROBILINOGEN 0.2 01/10/2010 1059   NITRITE POSITIVE (A) 02/18/2020 1930   LEUKOCYTESUR SMALL (A) 02/18/2020 1930    Radiological Exams on Admission: CT Head Wo Contrast  Result Date: 02/18/2020 CLINICAL DATA:  Generalized weakness x6 days. EXAM: CT HEAD WITHOUT CONTRAST TECHNIQUE: Contiguous axial images were obtained from the base of the skull through the vertex without intravenous contrast. COMPARISON:  December 09, 2018 FINDINGS: Brain: There is mild cerebral atrophy with widening of the extra-axial spaces and ventricular dilatation. There are areas of decreased attenuation within the white matter tracts of the supratentorial brain, consistent with microvascular disease changes. Vascular: No hyperdense vessel or unexpected calcification. Skull: Normal. Negative for fracture or focal lesion. Sinuses/Orbits:  No acute finding. Other: None. IMPRESSION: 1. Generalized cerebral  atrophy. 2. No acute intracranial abnormality. Electronically Signed   By: Aram Candela M.D.   On: 02/18/2020 17:41   CT Cervical Spine Wo Contrast  Result Date: 02/18/2020 CLINICAL DATA:  Larey Seat, pain, generalized weakness for 6 days EXAM: CT CERVICAL SPINE WITHOUT CONTRAST TECHNIQUE: Multidetector CT imaging of the cervical spine was performed without intravenous contrast. Multiplanar CT image reconstructions were also generated. COMPARISON:  03/01/2019 FINDINGS: Alignment: Alignment is anatomic. Skull base and vertebrae: No acute fracture. Soft tissues and spinal canal: No prevertebral fluid or swelling. No visible canal hematoma. Disc levels:  No significant spondylosis or facet hypertrophy. Upper chest: Airway is patent.  Lung apices are clear. Other: Reconstructed images demonstrate no additional findings. IMPRESSION: 1. Unremarkable cervical spine.  No acute fracture. Electronically Signed   By: Sharlet Salina M.D.   On: 02/18/2020 17:41   MR BRAIN WO CONTRAST  Result Date: 02/18/2020 CLINICAL DATA:  69 year old female with weakness and dizziness for 6 days. Decreased p.o. EXAM: MRI HEAD WITHOUT CONTRAST TECHNIQUE: Multiplanar, multiecho pulse sequences of the brain and surrounding structures were obtained without intravenous contrast. COMPARISON:  Head CT earlier today. FINDINGS: Brain: No restricted diffusion to suggest acute infarction. No midline shift, mass effect, evidence of mass lesion, ventriculomegaly, extra-axial collection or acute intracranial hemorrhage. Cervicomedullary junction and pituitary are within normal limits. Wallace Cullens and white matter signal is within normal limits for age throughout the brain. No chronic cerebral blood products. Vascular: Major intracranial vascular flow voids are preserved. Skull and upper cervical spine: Negative for age visible cervical spine. Chronic hyperostosis of the calvarium. Visualized  bone marrow signal is within normal limits. Sinuses/Orbits: Postoperative changes to both globes. Trace paranasal sinus mucosal thickening and right mastoid fluid. Other: Visible internal auditory structures appear normal. Normal stylomastoid foramina. Scalp and face soft tissues appear negative. IMPRESSION: No acute intracranial abnormality. Normal for age noncontrast MRI appearance of the brain. Electronically Signed   By: Odessa Fleming M.D.   On: 02/18/2020 20:24   EKG: Independently reviewed.  Vent. rate 72 BPM PR interval * ms QRS duration 100 ms QT/QTc 471/516 ms P-R-T axes 123 156 * Right and left arm electrode reversal, interpretation assumes no reversal Sinus rhythm Right axis deviation Abnormal R-wave progression, early transition Nonspecific T abnormalities, diffuse leads Prolonged QT interval  Assessment/Plan Principal Problem:   Syncope Observation/telemetry. Gentle/time-limited IV fluids. Hold lisinopril. Check echocardiogram. Check carotid Doppler.  Active Problems:   Prolonged QT interval Magnesium sulfate 2 g IVPB x1. K-Dur 20 mEq p.o. daily. Start daily mag oxide QT prolonging meds. This should help with constipation.    Hypothyroidism Continue levothyroxine 150 mcg p.o. daily.    Glucose intolerance Carbohydrate modified diet. CBG monitoring before meals and bedtime. Check hemoglobin A1c.    Hyperlipidemia On atorvastatin 20 mg p.o. daily.    GERD Continue Protonix 40 mg p.o. twice daily    Constipation Continue lubiprostone 24 mcg daily. Add daily mag oxide 400 given prolonged QT finding.    Normocytic anemia Monitor H&H.    Anxiety with depression   History of bipolar 1 disorder. Continue fluoxetine 20 mg p.o. twice daily. Continue trazodone 200 mg p.o. at bedtime. Continue Depakote 750 mg p.o. at bedtime. Continue alprazolam 0.5 mg p.o. twice daily/QHS as needed.    Chronic back pain Continue oxycodone as needed. Hold cyclobenzaprine for  now.    DVT prophylaxis: SCDs. Code Status:   Full code. Family Communication:   Disposition Plan:   Patient is from:  Home.  Anticipated DC to:  Home.  Anticipated DC date:  02/19/2020.  Anticipated DC barriers: Clinical improvement. Consults called: Admission status:  Observation telemetry.  Severity of Illness: Medium severity.  Bobette Mo MD [facility]  How to contact the [facility] Attending or Consulting provider 7A - 7P or covering provider during after hours 7P -7A, for this patient?   1. Check the care team in [facility] and look for a) attending/consulting [facility] provider listed and b) the [facility] team listed 2. Log into [email or web address] and use [facility] universal password to access. If you do not have the password, please contact the hospital operator. 3. Locate the [facility] provider you are looking for under [facility] and page to a number that you can be directly reached. 4. If you still have difficulty reaching the provider, please page the [facility] (Director on Call) for the Hospitalists listed on amion for assistance.  02/18/2020, 10:04 PM   This document was prepared using Dragon voice recognition software and may contain some unintended transcription errors.

## 2020-02-18 NOTE — ED Provider Notes (Signed)
[facility] Provider Note   CSN: [id number] Arrival date & time: 02/18/20  1634     History Chief Complaint  Patient presents with  . Weakness    Diane [name] is a 69 y.o. female.  Patient presents with EMS for worsening general weakness and dizziness for the past 6 days.  Patient has not been eating or drinking very well.  Patient has mild dysuria.  Patient was getting into the car to come to the hospital and had syncopal episode in the parking lot.  Patient fell and hit her face with the fall.  Patient denies any new medications.  Glucose was normal on route.  No fevers.  No history of stroke or cardiac history.  Patient denies chest pain or shortness of breath.  No blood in the stools.        Past Medical History:  Diagnosis Date  . Allergic rhinitis   . Anxiety   . Arthritis   . Cataracts, bilateral   . Chronic back pain    Sees pain [facility]  . Chronic pain syndrome   . Constipation   . Depression   . GERD (gastroesophageal reflux disease)   . Headaches, cluster 3 yrs ago   last one  . High cholesterol   . Hypertension   . Hypothyroid   . Immature cataract   . Limp    right leg  . Mania (HCC)   . PTSD (post-traumatic stress disorder)   . Right lumbar radiculopathy   . Seizures (HCC) 20 yrs ago   unknown etiology-on meds  . Sleep apnea    STOP BANG score=4  . 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[facility](HCC)     Patient Active Problem List   Diagnosis Date Noted  . Syncope 02/18/2020  . Prolonged QT interval 02/18/2020  . Normocytic anemia 12/10/2019  . Poor appetite 09/24/2019  . Abnormal weight loss 09/24/2019  . Upper abdominal mass 09/24/2019  . Dysphagia 01/30/2019  . Simple adnexal cyst greater than 1 cm in diameter in postmenopausal patient 11/08/2018  . Abdominal pain 08/09/2015  . Dysphagia, pharyngoesophageal phase   . H/O adenomatous polyp of colon 05/05/2015  . Constipation 02/02/2015  . Knee contusion 12/09/2013  .  Esophageal dysphagia 07/02/2013  . Abdominal pain, epigastric 07/02/2013  . Chronic pain syndrome 11/28/2012  . 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 Cellulitis 11/04/2012  . Postoperative wound breakdown 11/04/2012  . 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 Knee pain 08/13/2012  . Hypokalemia 08/01/2012  . Cellulitis, wound, post-operative 07/30/2012  . Degenerative tear of medial meniscus of right knee 01/12/2012  . OA (osteoarthritis) of knee 01/12/2012  . Right knee sprain 11/02/2011  . Instability of knee joint 03/29/2011  . URTICARIA 12/21/2009  . OTHER URINARY INCONTINENCE 07/18/2009  . FOOT PAIN, LEFT 05/26/2009  . DERMATITIS, CHRONIC 10/21/2008  . IMPAIRED GLUCOSE TOLERANCE 09/09/2008  . BACK PAIN WITH RADICULOPATHY 09/09/2008  . HYPOTHYROIDISM 02/27/2008  . Hyperlipidemia 02/27/2008  . ANXIETY 02/27/2008  . Bipolar I disorder, most recent episode (or current) depressed, unspecified 02/27/2008  . HYPERTENSION 02/27/2008  . ALLERGIC RHINITIS 02/27/2008  . GERD 02/27/2008  . HEADACHE 02/27/2008    Past Surgical History:  Procedure Laterality Date  . BACK SURGERY    . CATARACT EXTRACTION W/PHACO Left 01/04/2016   Procedure: CATARACT EXTRACTION PHACO AND INTRAOCULAR LENS PLACEMENT (IOC);  Surgeon: Jethro BolusMark Shapiro, MD;  Location: [facility];  Service: Ophthalmology;  Laterality: Left;  CDE:4.70  .  CATARACT EXTRACTION W/PHACO Right 01/18/2016   Procedure: CATARACT EXTRACTION PHACO AND INTRAOCULAR LENS PLACEMENT RIGHT EYE; CDE:  4.66;  Surgeon: Jethro Bolus, MD;  Location: [facility];  Service: Ophthalmology;  Laterality: Right;  . CHOLECYSTECTOMY    . COLONOSCOPY  10/16/2008   QQI:WLNLGXQJJHE, otherwise normal rectum; pancolonic diverticula the remainder of the colonic mucosa appeared normal. Next TCS due 10/2013.  Marland Kitchen COLONOSCOPY  06/28/2006   RMR: Internal hemorrhoids. Diminutive rectal polyps, cold biopsied/ Pancolonic diverticula. Polyps in the right colon removed     . COLONOSCOPY WITH PROPOFOL N/A 05/20/2015   RMR: Pancolonic diverticulosis. Redundant colon   . ESOPHAGOGASTRODUODENOSCOPY  10/16/2008   RDE:YCXKG hiatal hernia otherwise normal esophagus, stomach  D1, D2, status post passage of a 56-French Maloney dilator  . ESOPHAGOGASTRODUODENOSCOPY  06/28/2006   YJE:HUDJSH esophagus. Small hiatal hernia. Otherwise normal stomach, D1 and D2, status post passage of a 56 [place] Maloney dilator  . ESOPHAGOGASTRODUODENOSCOPY (EGD) WITH ESOPHAGEAL DILATION N/A 07/14/2013   RMR: Abnormal esophagus of uncertain significance-status post esophageal biospy. small hiatal hernia.   Marland Kitchen ESOPHAGOGASTRODUODENOSCOPY (EGD) WITH PROPOFOL N/A 05/20/2015   RMR: NORMAL EGD status post maloney dilation   . ESOPHAGOGASTRODUODENOSCOPY (EGD) WITH PROPOFOL N/A 12/23/2018   Dr. Jena Gauss: normal esophagus, s/p esophageal dilation.   Marland Kitchen FOOT SURGERY     left foot-  . INCISION AND DRAINAGE Right 01/03/2013   Procedure: INCISION AND DRAINAGE;  Surgeon: Vickki Hearing, MD;  Location: [facility];  Service: Orthopedics;  Laterality: Right;  . KNEE ARTHROSCOPY  01/2012   right  . KNEE ARTHROSCOPY WITH LATERAL RELEASE Right 01/03/2013   Procedure: Lateral Release Patella Right Knee;  Surgeon: Vickki Hearing, MD;  Location: [facility];  Service: Orthopedics;  Laterality: Right;  . LAPAROSCOPIC LYSIS OF ADHESIONS N/A 11/12/2019   Procedure: LAPAROSCOPIC LYSIS OF ADHESIONS, extensive; drainage of peritoneal cyst;  Surgeon: Lazaro Arms, MD;  Location: [facility];  Service: Gynecology;  Laterality: N/A;  . left foot  x 2  . LUMBAR DISC SURGERY  L4-5  . MALONEY DILATION N/A 05/20/2015   Procedure: Elease Hashimoto DILATION;  Surgeon: Corbin Ade, MD;  Location: [facility];  Service: Endoscopy;  Laterality: N/A;  Maloney dilator # [id number]  . MALONEY DILATION N/A 12/23/2018   Procedure: Elease Hashimoto DILATION;  Surgeon: Corbin Ade, MD;  Location: [facility];  Service: Endoscopy;  Laterality: N/A;  . oophoectomy  bilateral  .  PARTIAL HYSTERECTOMY    . TOTAL KNEE ARTHROPLASTY  07/01/2012   Procedure: TOTAL KNEE ARTHROPLASTY;  Surgeon: Vickki Hearing, MD;  Location: [facility];  Service: Orthopedics;  Laterality: Right;  . TOTAL KNEE REVISION Right 01/03/2013   Procedure: Patellaplasty Right Knee;  Surgeon: Vickki Hearing, MD;  Location: [facility];  Service: Orthopedics;  Laterality: Right;     OB History    Gravida  2   Para  2   Term  2   Preterm      AB      Living  2     SAB      TAB      Ectopic      Multiple      Live Births              Family History  Problem Relation Age of Onset  . Alcohol abuse Mother   . Anxiety disorder Mother   . Alcohol abuse Father   . Anxiety disorder Father   . Bipolar disorder Sister   .  Alcohol abuse Brother   . Anxiety disorder Brother   . Drug abuse Brother   . Dementia Paternal Aunt   . ADD / ADHD Grandchild   . ADD / ADHD Grandchild   . Drug abuse Brother   . Alcohol abuse Brother   . Anxiety disorder Brother   . Seizures Brother   . Alcohol abuse Brother   . Anxiety disorder Brother   . Seizures Brother   . Alcohol abuse Brother   . Anxiety disorder Brother   . Alcohol abuse Brother   . Anxiety disorder Brother   . Heart disease Other   . Diabetes Other   . Alcohol abuse Other   . Depression Neg Hx   . OCD Neg Hx   . Paranoid behavior Neg Hx   . Schizophrenia Neg Hx   . Sexual abuse Neg Hx   . Physical abuse Neg Hx   . Colon cancer Neg Hx     Social History   Tobacco Use  . Smoking status: Former Smoker    Packs/day: 0.25    Years: 5.00    Pack years: 1.25    Types: Cigarettes    Quit date: 06/26/2003    Years since quitting: 16.6  . Smokeless tobacco: Never Used  Substance Use Topics  . Alcohol use: Yes    Alcohol/week: 0.0 standard drinks    Comment: occasionally; once a month.  . Drug use: Not Currently    Comment: has a past history of street drug use    Home Medications Prior to Admission medications     Medication Sig Start Date End Date Taking? Authorizing Provider  ALPRAZolam (XANAX) 0.5 MG tablet TAKE ONE TABLET BY MOUTH TWICE DAILY AND TWO TABLETS AT BEDTIME. Patient taking differently: Take 0.5 mg by mouth See admin instructions. Take one tablet by mouth twice daily and take two tablets at bedtime 01/06/20  Yes Myrlene Broker, MD  atorvastatin (LIPITOR) 20 MG tablet Take 20 mg by mouth daily.   Yes [provider]  cyclobenzaprine (FLEXERIL) 10 MG tablet Take 1 tablet (10 mg total) by mouth 2 (two) times daily as needed for muscle spasms. 11/06/19  Yes Vickki Hearing, MD  divalproex (DEPAKOTE) 250 MG DR tablet TAKE THREE (3) TABLETS BY MOUTH AT BEDTIME. Patient taking differently: Take 750 mg by mouth at bedtime.  01/06/20  Yes Myrlene Broker, MD  FLUoxetine (PROZAC) 20 MG capsule TAKE ONE CAPSULE BY MOUTH TWICE DAILY. Patient taking differently: Take 20 mg by mouth 2 (two) times daily.  01/06/20  Yes Myrlene Broker, MD  hydrocortisone (ANUSOL-HC) 2.5 % rectal cream Place 1 application rectally 2 (two) times daily. 02/06/18  Yes Gelene Mink, NP  levothyroxine (SYNTHROID, LEVOTHROID) 150 MCG tablet Take 150 mcg by mouth daily before breakfast.  03/29/17  Yes [provider]  lisinopril (PRINIVIL,ZESTRIL) 10 MG tablet Take 10 mg by mouth at bedtime.    Yes [provider]  lubiprostone (AMITIZA) 24 MCG capsule TAKE ONE CAPSULE BY MOUTH TWICE DAILY. TAKE WITH A MEAL. Patient taking differently: Take 24 mcg by mouth 2 (two) times daily with a meal.  02/04/20  Yes Clearance Coots, Kristen S, PA-C  meloxicam (MOBIC) 7.5 MG tablet Take 1 tablet (7.5 mg total) by mouth daily. 11/06/19  Yes Vickki Hearing, MD  oxyCODONE-acetaminophen (PERCOCET) 7.5-325 MG tablet One q 6 hrs prn pain Patient taking differently: Take 1 tablet by mouth every 6 (six) hours as needed for moderate pain  or severe pain.  02/17/20  Yes Vickki Hearing, MD  pantoprazole (PROTONIX) 40 MG tablet TAKE  ONE TABLET BY MOUTH TWICE DAILY. Patient taking differently: Take 40 mg by mouth 2 (two) times daily.  01/08/20  Yes Harper, Kristen S, PA-C  SYMPROIC 0.2 MG TABS TAKE ONE TABLET BY MOUTH DAILY FOR CONSTIPATION ON DAYS YOU TAKE YOUR PAIN MEDICATION. Patient taking differently: Take 0.2 mg by mouth daily as needed (when taking oxycodone).  06/12/19  Yes Tiffany Kocher, PA-C  traZODone (DESYREL) 100 MG tablet TAKE TWO (2) TABLETS BY MOUTH AT BEDTIME. Patient taking differently: Take 200 mg by mouth at bedtime.  01/05/20  Yes Myrlene Broker, MD  ketorolac (TORADOL) 10 MG tablet Take 1 tablet (10 mg total) by mouth every 8 (eight) hours as needed. Patient not taking: Reported on 02/18/2020 11/12/19   Lazaro Arms, MD  ondansetron (ZOFRAN ODT) 8 MG disintegrating tablet Take 1 tablet (8 mg total) by mouth every 8 (eight) hours as needed for nausea or vomiting. Patient not taking: Reported on 02/18/2020 11/12/19   Lazaro Arms, MD  oxyCODONE-acetaminophen (PERCOCET) 7.5-325 MG tablet Take 1-2 tablets by mouth every 6 (six) hours as needed. Patient not taking: Reported on 02/18/2020 11/14/19   Vickki Hearing, MD    Allergies    Neomycin-bacitracin zn-polymyx, Penicillins, and Sulfonamide derivatives  Review of Systems   Review of Systems  Constitutional: Positive for fatigue. Negative for chills and fever.  HENT: Negative for congestion.   Eyes: Negative for visual disturbance.  Respiratory: Negative for shortness of breath.   Cardiovascular: Negative for chest pain.  Gastrointestinal: Positive for nausea. Negative for abdominal pain and vomiting.  Genitourinary: Positive for dysuria. Negative for flank pain.  Musculoskeletal: Negative for back pain, neck pain and neck stiffness.  Skin: Negative for rash.  Neurological: Positive for dizziness, syncope, weakness and light-headedness. Negative for headaches.    Physical Exam Updated Vital Signs BP (!) 149/71   Pulse 68   Temp 98.8 F (37.1 C)  (Oral)   Resp 17   Ht 5\' 3"  (1.6 m)   Wt 91.6 kg   SpO2 99%   BMI 35.78 kg/m   Physical Exam Vitals and nursing note reviewed.  Constitutional:      Appearance: She is well-developed.  HENT:     Head: Normocephalic and atraumatic.     Mouth/Throat:     Mouth: Mucous membranes are dry.  Eyes:     General:        Right eye: No discharge.        Left eye: No discharge.     Conjunctiva/sclera: Conjunctivae normal.  Neck:     Trachea: No tracheal deviation.  Cardiovascular:     Rate and Rhythm: Normal rate and regular rhythm.  Pulmonary:     Effort: Pulmonary effort is normal.     Breath sounds: Normal breath sounds.  Abdominal:     General: There is no distension.     Palpations: Abdomen is soft.     Tenderness: There is no abdominal tenderness. There is no guarding.  Musculoskeletal:        General: No tenderness.     Cervical back: Normal range of motion and neck supple.  Skin:    General: Skin is warm.     Findings: No rash.  Neurological:     General: No focal deficit present.     Mental Status: She is alert and oriented to person, place, and  time.     Comments: Patient has general weakness, mild shaky with arm movements.  No arm drift, patient unable to lift leg above 15 degrees due to general weakness bilateral.  General fatigue appearance.  Extraocular muscle function intact.  Pupils equal bilateral.  Psychiatric:        Mood and Affect: Affect is blunt and flat.        Behavior: Behavior is slowed.        Judgment: Judgment is not impulsive.     Comments: Tired appearance     ED Results / Procedures / Treatments   Labs (all labs ordered are listed, but only abnormal results are displayed) Labs Reviewed  COMPREHENSIVE METABOLIC PANEL - Abnormal; Notable for the following components:      Result Value   Creatinine, Ser 1.25 (*)    Calcium 8.6 (*)    AST 14 (*)    GFR calc non Af Amer 44 (*)    GFR calc Af Amer 51 (*)    All other components within normal  limits  SALICYLATE LEVEL - Abnormal; Notable for the following components:   Salicylate Lvl <7.0 (*)    All other components within normal limits  CBC WITH DIFFERENTIAL/PLATELET - Abnormal; Notable for the following components:   RBC 3.83 (*)    Hemoglobin 11.1 (*)    HCT 35.1 (*)    All other components within normal limits  URINALYSIS, ROUTINE W REFLEX MICROSCOPIC - Abnormal; Notable for the following components:   APPearance HAZY (*)    Bilirubin Urine SMALL (*)    Nitrite POSITIVE (*)    Leukocytes,Ua SMALL (*)    Bacteria, UA FEW (*)    All other components within normal limits  TSH - Abnormal; Notable for the following components:   TSH 7.105 (*)    All other components within normal limits  SARS CORONAVIRUS 2 BY RT PCR (HOSPITAL ORDER, PERFORMED IN [place] [facility])  ETHANOL  MAGNESIUM    EKG EKG Interpretation  Date/Time:  Wednesday February 18 2020 17:43:15 EDT Ventricular Rate:  72 PR Interval:    QRS Duration: 100 QT Interval:  471 QTC Calculation: 516 R Axis:   156 Text Interpretation: Right and left arm electrode reversal, interpretation assumes no reversal Sinus rhythm Right axis deviation Abnormal R-wave progression, early transition Nonspecific T abnormalities, diffuse leads Prolonged QT interval Confirmed by Blane Ohara [phone]) on 02/18/2020 6:49:38 PM   Radiology CT Head Wo Contrast  Result Date: 02/18/2020 CLINICAL DATA:  Generalized weakness x6 days. EXAM: CT HEAD WITHOUT CONTRAST TECHNIQUE: Contiguous axial images were obtained from the base of the skull through the vertex without intravenous contrast. COMPARISON:  December 09, 2018 FINDINGS: Brain: There is mild cerebral atrophy with widening of the extra-axial spaces and ventricular dilatation. There are areas of decreased attenuation within the white matter tracts of the supratentorial brain, consistent with microvascular disease changes. Vascular: No hyperdense vessel or unexpected calcification.  Skull: Normal. Negative for fracture or focal lesion. Sinuses/Orbits: No acute finding. Other: None. IMPRESSION: 1. Generalized cerebral atrophy. 2. No acute intracranial abnormality. Electronically Signed   By: Aram Candela M.D.   On: 02/18/2020 17:41   CT Cervical Spine Wo Contrast  Result Date: 02/18/2020 CLINICAL DATA:  Larey Seat, pain, generalized weakness for 6 days EXAM: CT CERVICAL SPINE WITHOUT CONTRAST TECHNIQUE: Multidetector CT imaging of the cervical spine was performed without intravenous contrast. Multiplanar CT image reconstructions were also generated. COMPARISON:  03/01/2019 FINDINGS: Alignment: Alignment is  anatomic. Skull base and vertebrae: No acute fracture. Soft tissues and spinal canal: No prevertebral fluid or swelling. No visible canal hematoma. Disc levels:  No significant spondylosis or facet hypertrophy. Upper chest: Airway is patent.  Lung apices are clear. Other: Reconstructed images demonstrate no additional findings. IMPRESSION: 1. Unremarkable cervical spine.  No acute fracture. Electronically Signed   By: Randa Ngo M.D.   On: 02/18/2020 17:41   MR BRAIN WO CONTRAST  Result Date: 02/18/2020 CLINICAL DATA:  69 year old female with weakness and dizziness for 6 days. Decreased p.o. EXAM: MRI HEAD WITHOUT CONTRAST TECHNIQUE: Multiplanar, multiecho pulse sequences of the brain and surrounding structures were obtained without intravenous contrast. COMPARISON:  Head CT earlier today. FINDINGS: Brain: No restricted diffusion to suggest acute infarction. No midline shift, mass effect, evidence of mass lesion, ventriculomegaly, extra-axial collection or acute intracranial hemorrhage. Cervicomedullary junction and pituitary are within normal limits. Pearline Cables and white matter signal is within normal limits for age throughout the brain. No chronic cerebral blood products. Vascular: Major intracranial vascular flow voids are preserved. Skull and upper cervical spine: Negative for age  visible cervical spine. Chronic hyperostosis of the calvarium. Visualized bone marrow signal is within normal limits. Sinuses/Orbits: Postoperative changes to both globes. Trace paranasal sinus mucosal thickening and right mastoid fluid. Other: Visible internal auditory structures appear normal. Normal stylomastoid foramina. Scalp and face soft tissues appear negative. IMPRESSION: No acute intracranial abnormality. Normal for age noncontrast MRI appearance of the brain. Electronically Signed   By: Genevie Ann M.D.   On: 02/18/2020 20:24    Procedures Procedures (including critical care time)  Medications Ordered in ED Medications  magnesium sulfate IVPB 2 g 50 mL (has no administration in time range)  sodium chloride 0.9 % bolus 500 mL (500 mLs Intravenous Given by EMS 02/18/20 1741)  sodium chloride 0.9 % bolus 500 mL (500 mLs Intravenous New Bag/Given 02/18/20 2143)    ED Course  I have reviewed the triage vital signs and the nursing notes.  Pertinent labs & imaging results that were available during my care of the patient were reviewed by me and considered in my medical decision making (see chart for details).    MDM Rules/Calculators/A&P                      Patient presents with worsening general symptoms including dizziness, general weakness, decreased appetite with slight lightheaded/syncopal episode. Likely multifactorial differential includes metabolic, medication side effect, anemia, subdural/intracranial/stroke, thyroid related, infectious/urine infection, other. Plan to check blood work, urinalysis, CT scan of the head, fluid bolus and reassessment.  Blood work reviewed TSH 7.1, creatinine elevated 1.25 likely dehydration, urinalysis reviewed possible mild infection with positive nitrite and bacteria, hemoglobin 11. With persistent dizziness MRI ordered and reviewed no acute stroke.  No bleeding on CT scan of the head.  Repeat IV fluids given.  Discussed with hospitalist for admission  for further work-up of syncope and dizziness.  Final Clinical Impression(s) / ED Diagnoses Final diagnoses:  Syncope and collapse  General weakness  Gait abnormality  Acute kidney insufficiency    Rx / DC Orders ED Discharge Orders    None       Elnora Morrison, MD 02/18/20 2149

## 2020-02-18 NOTE — ED Triage Notes (Addendum)
EMS reports pt has had generalized weakness x 6 days, not eating or drinking well.  Reports was getting in her car to come to hospital and had syncopal episode in the parking lot at her apt complex.  Reports frequent urination, burning with urination, and strong odor.    EMS says pt fell face first.  Reports headache after the fall. cbg 112

## 2020-02-19 ENCOUNTER — Observation Stay (HOSPITAL_COMMUNITY): Payer: Medicare Other

## 2020-02-19 ENCOUNTER — Observation Stay (HOSPITAL_BASED_OUTPATIENT_CLINIC_OR_DEPARTMENT_OTHER): Payer: Medicare Other

## 2020-02-19 DIAGNOSIS — R55 Syncope and collapse: Secondary | ICD-10-CM

## 2020-02-19 DIAGNOSIS — M545 Low back pain: Secondary | ICD-10-CM

## 2020-02-19 DIAGNOSIS — D649 Anemia, unspecified: Secondary | ICD-10-CM

## 2020-02-19 DIAGNOSIS — R9431 Abnormal electrocardiogram [ECG] [EKG]: Secondary | ICD-10-CM

## 2020-02-19 DIAGNOSIS — G8929 Other chronic pain: Secondary | ICD-10-CM

## 2020-02-19 LAB — GLUCOSE, CAPILLARY
Glucose-Capillary: 64 mg/dL — ABNORMAL LOW (ref 70–99)
Glucose-Capillary: 80 mg/dL (ref 70–99)
Glucose-Capillary: 96 mg/dL (ref 70–99)
Glucose-Capillary: 96 mg/dL (ref 70–99)

## 2020-02-19 LAB — BASIC METABOLIC PANEL
Anion gap: 8 (ref 5–15)
BUN: 14 mg/dL (ref 8–23)
CO2: 28 mmol/L (ref 22–32)
Calcium: 8.5 mg/dL — ABNORMAL LOW (ref 8.9–10.3)
Chloride: 104 mmol/L (ref 98–111)
Creatinine, Ser: 1.02 mg/dL — ABNORMAL HIGH (ref 0.44–1.00)
GFR calc Af Amer: >60 mL/min (ref >60)
GFR calc non Af Amer: 56 mL/min — ABNORMAL LOW (ref >60)
Glucose, Bld: 89 mg/dL (ref 70–99)
Potassium: 3.8 mmol/L (ref 3.5–5.1)
Sodium: 140 mmol/L (ref 135–145)

## 2020-02-19 LAB — HEMOGLOBIN A1C
Hgb A1c MFr Bld: 5.5 % (ref 4.8–5.6)
Mean Plasma Glucose: 111.15 mg/dL

## 2020-02-19 LAB — CBC
HCT: 34.8 % — ABNORMAL LOW (ref 36.0–46.0)
Hemoglobin: 10.9 g/dL — ABNORMAL LOW (ref 12.0–15.0)
MCH: 28.1 pg (ref 26.0–34.0)
MCHC: 31.3 g/dL (ref 30.0–36.0)
MCV: 89.7 fL (ref 80.0–100.0)
Platelets: 173 10*3/uL (ref 150–400)
RBC: 3.88 MIL/uL (ref 3.87–5.11)
RDW: 13.8 % (ref 11.5–15.5)
WBC: 7.2 10*3/uL (ref 4.0–10.5)
nRBC: 0 % (ref 0.0–0.2)

## 2020-02-19 LAB — ECHOCARDIOGRAM COMPLETE
Height: 63 in
Weight: 3171.1 oz

## 2020-02-19 MED ORDER — TRAZODONE HCL 50 MG PO TABS
200.0000 mg | ORAL_TABLET | Freq: Every day | ORAL | Status: DC
  Administered 2020-02-19: 200 mg via ORAL
  Filled 2020-02-19: qty 4

## 2020-02-19 MED ORDER — FLUOXETINE HCL 20 MG PO CAPS
20.0000 mg | ORAL_CAPSULE | Freq: Two times a day (BID) | ORAL | Status: DC
  Administered 2020-02-19 (×2): 20 mg via ORAL
  Filled 2020-02-19 (×2): qty 1

## 2020-02-19 MED ORDER — CEPHALEXIN 500 MG PO CAPS: 500.0000 mg | ORAL_CAPSULE | Freq: Three times a day (TID) | ORAL | 0 refills | Status: AC

## 2020-02-19 MED ORDER — HYDROCORTISONE 2.5 % RE CREA
1.0000 "application " | TOPICAL_CREAM | Freq: Two times a day (BID) | RECTAL | Status: DC
  Administered 2020-02-19: 1 via RECTAL
  Filled 2020-02-19: qty 28.35

## 2020-02-19 MED ORDER — PANTOPRAZOLE SODIUM 40 MG PO TBEC
40.0000 mg | DELAYED_RELEASE_TABLET | Freq: Two times a day (BID) | ORAL | Status: DC
  Administered 2020-02-19 (×2): 40 mg via ORAL
  Filled 2020-02-19 (×2): qty 1

## 2020-02-19 MED ORDER — SODIUM CHLORIDE 0.9 % IV SOLN
1.0000 g | Freq: Once | INTRAVENOUS | Status: AC
  Administered 2020-02-19: 1 g via INTRAVENOUS
  Filled 2020-02-19: qty 10

## 2020-02-19 MED ORDER — NALDEMEDINE TOSYLATE 0.2 MG PO TABS: 0.2000 mg | ORAL_TABLET | Freq: Every day | ORAL | Status: DC | PRN

## 2020-02-19 MED ORDER — LISINOPRIL 10 MG PO TABS: 10.0000 mg | ORAL_TABLET | Freq: Every day | ORAL | Status: DC

## 2020-02-19 MED ORDER — OXYCODONE-ACETAMINOPHEN 7.5-325 MG PO TABS: 1.0000 | ORAL_TABLET | Freq: Four times a day (QID) | ORAL | Status: DC | PRN

## 2020-02-19 MED ORDER — LUBIPROSTONE 24 MCG PO CAPS
24.0000 ug | ORAL_CAPSULE | Freq: Two times a day (BID) | ORAL | Status: DC
  Administered 2020-02-19: 24 ug via ORAL
  Filled 2020-02-19: qty 1

## 2020-02-19 MED ORDER — ACETAMINOPHEN 325 MG PO TABS: 650.0000 mg | ORAL_TABLET | Freq: Four times a day (QID) | ORAL | 1 refills | Status: DC | PRN

## 2020-02-19 MED ORDER — LEVOTHYROXINE SODIUM 50 MCG PO TABS
150.0000 ug | ORAL_TABLET | Freq: Every day | ORAL | Status: DC
  Administered 2020-02-19: 150 ug via ORAL
  Filled 2020-02-19: qty 3

## 2020-02-19 MED ORDER — MECLIZINE HCL 25 MG PO TABS: 25.0000 mg | ORAL_TABLET | Freq: Three times a day (TID) | ORAL | 0 refills | Status: DC | PRN

## 2020-02-19 MED ORDER — ALPRAZOLAM 0.5 MG PO TABS: 0.5000 mg | ORAL_TABLET | ORAL | Status: DC

## 2020-02-19 MED ORDER — ALPRAZOLAM 1 MG PO TABS
1.0000 mg | ORAL_TABLET | Freq: Every day | ORAL | Status: DC
  Administered 2020-02-19: 1 mg via ORAL
  Filled 2020-02-19: qty 1

## 2020-02-19 MED ORDER — ATORVASTATIN CALCIUM 10 MG PO TABS
20.0000 mg | ORAL_TABLET | Freq: Every day | ORAL | Status: DC
  Administered 2020-02-19: 20 mg via ORAL
  Filled 2020-02-19: qty 2

## 2020-02-19 MED ORDER — ALPRAZOLAM 0.5 MG PO TABS
0.5000 mg | ORAL_TABLET | Freq: Two times a day (BID) | ORAL | Status: DC
  Administered 2020-02-19 (×2): 0.5 mg via ORAL
  Filled 2020-02-19 (×2): qty 1

## 2020-02-19 MED ORDER — DIVALPROEX SODIUM 250 MG PO DR TAB
750.0000 mg | DELAYED_RELEASE_TABLET | Freq: Every day | ORAL | Status: DC
  Administered 2020-02-19: 750 mg via ORAL
  Filled 2020-02-19: qty 3

## 2020-02-19 NOTE — Discharge Summary (Addendum)
Diane [name], is a 69 y.o. female  DOB [birth year]  MRN [id number].  Admission date:  02/18/2020  Admitting Physician  Bobette Mo, MD  Discharge Date:  02/19/2020   Primary MD  Elfredia Nevins, MD  Recommendations for primary care physician for things to follow:   1)Follow-up with your primary care physician for reevaluation in 1 to 2 weeks 2)Please call [phone] on Friday, 02/20/2020 after 3 PM to get results of the urine culture test  Admission Diagnosis  Syncope and collapse [R55] Gait abnormality [R26.9] Syncope [R55] Acute kidney insufficiency [N28.9] General weakness [R53.1]   Discharge Diagnosis  Syncope and collapse [R55] Gait abnormality [R26.9] Syncope [R55] Acute kidney insufficiency [N28.9] General weakness [R53.1]  *  Principal Problem:   Syncope Active Problems:   Hypothyroidism   Hyperlipidemia   Anxiety with depression   GERD   Chronic back pain   Constipation   Normocytic anemia   Prolonged QT interval   Glucose intolerance      Past Medical History:  Diagnosis Date  . Allergic rhinitis   . Anxiety   . Arthritis   . Cataracts, bilateral   . Chronic back pain    Sees pain [facility]  . Chronic pain syndrome   . Constipation   . Depression   . GERD (gastroesophageal reflux disease)   . Headaches, cluster 3 yrs ago   last one  . High cholesterol   . Hypertension   . Hypothyroid   . Immature cataract   . Limp    right leg  . Mania (HCC)   . PTSD (post-traumatic stress disorder)   . Right lumbar radiculopathy   . Seizures (HCC) 20 yrs ago   unknown etiology-on meds  . Sleep apnea    STOP BANG score=4  . 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[facility])     Past Surgical History:  Procedure Laterality Date  . BACK SURGERY    . CATARACT EXTRACTION W/PHACO Left 01/04/2016   Procedure: CATARACT EXTRACTION PHACO AND INTRAOCULAR LENS PLACEMENT (IOC);   Surgeon: Jethro Bolus, MD;  Location: [facility];  Service: Ophthalmology;  Laterality: Left;  CDE:4.70  . CATARACT EXTRACTION W/PHACO Right 01/18/2016   Procedure: CATARACT EXTRACTION PHACO AND INTRAOCULAR LENS PLACEMENT RIGHT EYE; CDE:  4.66;  Surgeon: Jethro Bolus, MD;  Location: [facility];  Service: Ophthalmology;  Laterality: Right;  . CHOLECYSTECTOMY    . COLONOSCOPY  10/16/2008   QMV:HQIONGEXBMW, otherwise normal rectum; pancolonic diverticula the remainder of the colonic mucosa appeared normal. Next TCS due 10/2013.  Marland Kitchen COLONOSCOPY  06/28/2006   RMR: Internal hemorrhoids. Diminutive rectal polyps, cold biopsied/ Pancolonic diverticula. Polyps in the right colon removed   . COLONOSCOPY WITH PROPOFOL N/A 05/20/2015   RMR: Pancolonic diverticulosis. Redundant colon   . ESOPHAGOGASTRODUODENOSCOPY  10/16/2008   UXL:KGMWN hiatal hernia otherwise normal esophagus, stomach  D1, D2, status post passage of a 56-French Maloney dilator  . ESOPHAGOGASTRODUODENOSCOPY  06/28/2006   UUV:OZDGUY esophagus. Small hiatal hernia. Otherwise normal stomach, D1 and D2, status post  passage of a 28 [place] Maloney dilator  . ESOPHAGOGASTRODUODENOSCOPY (EGD) WITH ESOPHAGEAL DILATION N/A 07/14/2013   RMR: Abnormal esophagus of uncertain significance-status post esophageal biospy. small hiatal hernia.   Marland Kitchen ESOPHAGOGASTRODUODENOSCOPY (EGD) WITH PROPOFOL N/A 05/20/2015   RMR: NORMAL EGD status post maloney dilation   . ESOPHAGOGASTRODUODENOSCOPY (EGD) WITH PROPOFOL N/A 12/23/2018   Dr. Jena Gauss: normal esophagus, s/p esophageal dilation.   Marland Kitchen FOOT SURGERY     left foot-  . INCISION AND DRAINAGE Right 01/03/2013   Procedure: INCISION AND DRAINAGE;  Surgeon: Vickki Hearing, MD;  Location: [facility];  Service: Orthopedics;  Laterality: Right;  . KNEE ARTHROSCOPY  01/2012   right  . KNEE ARTHROSCOPY WITH LATERAL RELEASE Right 01/03/2013   Procedure: Lateral Release Patella Right Knee;  Surgeon: Vickki Hearing, MD;  Location: [facility];   Service: Orthopedics;  Laterality: Right;  . LAPAROSCOPIC LYSIS OF ADHESIONS N/A 11/12/2019   Procedure: LAPAROSCOPIC LYSIS OF ADHESIONS, extensive; drainage of peritoneal cyst;  Surgeon: Lazaro Arms, MD;  Location: [facility];  Service: Gynecology;  Laterality: N/A;  . left foot  x 2  . LUMBAR DISC SURGERY  L4-5  . MALONEY DILATION N/A 05/20/2015   Procedure: Elease Hashimoto DILATION;  Surgeon: Corbin Ade, MD;  Location: [facility];  Service: Endoscopy;  Laterality: N/A;  Maloney dilator # [id number]  . MALONEY DILATION N/A 12/23/2018   Procedure: Elease Hashimoto DILATION;  Surgeon: Corbin Ade, MD;  Location: [facility];  Service: Endoscopy;  Laterality: N/A;  . oophoectomy  bilateral  . PARTIAL HYSTERECTOMY    . TOTAL KNEE ARTHROPLASTY  07/01/2012   Procedure: TOTAL KNEE ARTHROPLASTY;  Surgeon: Vickki Hearing, MD;  Location: [facility];  Service: Orthopedics;  Laterality: Right;  . TOTAL KNEE REVISION Right 01/03/2013   Procedure: Patellaplasty Right Knee;  Surgeon: Vickki Hearing, MD;  Location: [facility];  Service: Orthopedics;  Laterality: Right;       HPI  from the history and physical done on the day of admission:     Chief Complaint: Weakness and dizziness.  HPI: Diane [name] is a 69 y.o. female with medical history significant of allergic rhinitis, anxiety, depression, PTSD, mania, osteoarthritis, bilateral cataracts, chronic back pain, constipation, GERD, hyperlipidemia, hypertension, hypothyroidism, sleep apnea, substance abuse in remission who is coming to the emergency department after complaining of having progressively worse weakness and dizziness for the past 6 days.  She denies chest pain, palpitations, dyspnea, diaphoresis, PND, orthopnea or pitting edema of the lower extremities.  She states that her appetite is decreased, but denies fever, chills, rhinorrhea, sore throat, wheezing or hemoptysis.  No abdominal pain, nausea, vomiting, diarrhea, constipation, melena or hematochezia.  No dysuria,  frequency or materia.  No polyuria, polydipsia, polyphagia or blurred vision.  ED Course: Initial vital signs were temperature 98.8 F, pulse 80, respiration 18, blood pressure 127/60 mmHg and O2 sat 91% on room air. The patient was given 2 500 mL NS boluses in the emergency department. She still felt lightheaded after the fluids.  Urinalysis shows positive nitrites, small leukocyte esterase, few bacteria with 0-5 RBC and 0-5 WBC on microscopic examination. Her CBC showed a white count 6.3, hemoglobin 11.1 g/dL and platelets 161. Alcohol and salicylate levels were normal. CMP shows normal electrolytes when calcium is corrected to albumin. Creatinine was 1.25 mg/dL and AST 14 units/L. The rest of the CMP measurements are within expected range. TSH was 7.105. EKG was sinus rhythm and with abnormal R wave progression,  nonspecific T abnormalities and prolonged QT interval  Imaging: Her chest radiograph showed a. CT head and MRI brain did not show any acute intracranial abnormality. CT cervical spine was unremarkable. There was no acute fracture identified. Please see images and full radiology report for further detail.  Review of Systems: As per HPI otherwise all other systems reviewed and are negative   Hospital Course:      1)Syncope-patient was placed on telemetry monitored unit, no significant arrhythmias noted,  -Prolonged QT intervals noted however patient is on multiple antipsychotic and psychiatric medications  -EKG without ACS type findings  --echocardiogram without significant aortic stenosis or other outflow obstruction, and  EF 55% without segmental/Regional wall motion abnormalities.  carotid artery Dopplers without hemodynamically significant stenosis -pt remains chest pain-free, -Dizziness improved significantly  2) presumed UTI-treated with IV Rocephin, discharged on p.o. Keflex  3) bipolar disorder/depression/anxiety--- patient is on multiple  antipsychotic/antidepressants----denies SI/HI---- -psychiatric medications probably contributing to prolonged QT interval -Up with PCP for further adjustments  4)chronic back pain--continue PTA meds  5) mild chronic anemia--- hemoglobin has been stable around 11 since February 2021 -Follow-up with PCP for further work-up -No evidence of ongoing bleeding at this time  6) generalized weakness and deconditioning the patient with dizziness and gait concerns----Dix-Hallpike maneuver negative today -Home health physical therapy advised to improve stability and gait   Discharge Condition: stable  Follow UP   Follow-up Information    Elfredia Nevins, MD Follow up.   Specialty: Internal Medicine Contact information: [address] [place] [address] [phone]                Consults obtained -   Diet and Activity recommendation:  As advised  Discharge Instructions     Discharge Instructions    Call MD for:  difficulty breathing, headache or visual disturbances   Complete by: As directed    Call MD for:  persistant dizziness or light-headedness   Complete by: As directed    Call MD for:  persistant nausea and vomiting   Complete by: As directed    Call MD for:  severe uncontrolled pain   Complete by: As directed    Call MD for:  temperature >100.4   Complete by: As directed    Diet - low sodium heart healthy   Complete by: As directed    Discharge instructions   Complete by: As directed    1)Follow-up with your primary care physician for reevaluation in 1 to 2 weeks 2)Please call [phone] on Friday, 02/20/2020 after 3 PM to get results of the urine culture test   Increase activity slowly   Complete by: As directed         Discharge Medications     Allergies as of 02/19/2020      Reactions   Neomycin-bacitracin Zn-polymyx Other (See Comments)   Reaction: skin starts to become raw and peels off   Penicillins Hives   Sulfonamide Derivatives  Hives, Swelling      Medication List    STOP taking these medications   ketorolac 10 MG tablet Commonly known as: TORADOL   ondansetron 8 MG disintegrating tablet Commonly known as: Zofran ODT     TAKE these medications   acetaminophen 325 MG tablet Commonly known as: TYLENOL Take 2 tablets (650 mg total) by mouth every 6 (six) hours as needed for mild pain (or Fever >/= 101).   ALPRAZolam 0.5 MG tablet Commonly known as: XANAX TAKE ONE TABLET BY MOUTH TWICE DAILY  AND TWO TABLETS AT BEDTIME. What changed: See the new instructions.   atorvastatin 20 MG tablet Commonly known as: LIPITOR Take 20 mg by mouth daily.   cephALEXin 500 MG capsule Commonly known as: Keflex Take 1 capsule (500 mg total) by mouth 3 (three) times daily for 9 doses. Start taking on: February 20, 2020   cyclobenzaprine 10 MG tablet Commonly known as: FLEXERIL Take 1 tablet (10 mg total) by mouth 2 (two) times daily as needed for muscle spasms.   divalproex 250 MG DR tablet Commonly known as: DEPAKOTE TAKE THREE (3) TABLETS BY MOUTH AT BEDTIME. What changed: See the new instructions.   FLUoxetine 20 MG capsule Commonly known as: PROZAC TAKE ONE CAPSULE BY MOUTH TWICE DAILY.   hydrocortisone 2.5 % rectal cream Commonly known as: ANUSOL-HC Place 1 application rectally 2 (two) times daily.   levothyroxine 150 MCG tablet Commonly known as: SYNTHROID Take 150 mcg by mouth daily before breakfast.   lisinopril 10 MG tablet Commonly known as: ZESTRIL Take 10 mg by mouth at bedtime.   lubiprostone 24 MCG capsule Commonly known as: AMITIZA TAKE ONE CAPSULE BY MOUTH TWICE DAILY. TAKE WITH A MEAL. What changed: See the new instructions.   meloxicam 7.5 MG tablet Commonly known as: MOBIC Take 1 tablet (7.5 mg total) by mouth daily.   oxyCODONE-acetaminophen 7.5-325 MG tablet Commonly known as: PERCOCET One q 6 hrs prn pain What changed:   how much to take  how to take this  when to take  this  reasons to take this  additional instructions  Another medication with the same name was removed. Continue taking this medication, and follow the directions you see here.   pantoprazole 40 MG tablet Commonly known as: PROTONIX TAKE ONE TABLET BY MOUTH TWICE DAILY.   Symproic 0.2 MG Tabs Generic drug: Naldemedine Tosylate TAKE ONE TABLET BY MOUTH DAILY FOR CONSTIPATION ON DAYS YOU TAKE YOUR PAIN MEDICATION. What changed: See the new instructions.   traZODone 100 MG tablet Commonly known as: DESYREL TAKE TWO (2) TABLETS BY MOUTH AT BEDTIME. What changed: See the new instructions.       Major procedures and Radiology Reports - PLEASE review detailed and final reports for all details, in brief -   CT Head Wo Contrast  Result Date: 02/18/2020 CLINICAL DATA:  Generalized weakness x6 days. EXAM: CT HEAD WITHOUT CONTRAST TECHNIQUE: Contiguous axial images were obtained from the base of the skull through the vertex without intravenous contrast. COMPARISON:  December 09, 2018 FINDINGS: Brain: There is mild cerebral atrophy with widening of the extra-axial spaces and ventricular dilatation. There are areas of decreased attenuation within the white matter tracts of the supratentorial brain, consistent with microvascular disease changes. Vascular: No hyperdense vessel or unexpected calcification. Skull: Normal. Negative for fracture or focal lesion. Sinuses/Orbits: No acute finding. Other: None. IMPRESSION: 1. Generalized cerebral atrophy. 2. No acute intracranial abnormality. Electronically Signed   By: Aram Candelahaddeus  Houston M.D.   On: 02/18/2020 17:41   CT Cervical Spine Wo Contrast  Result Date: 02/18/2020 CLINICAL DATA:  Larey SeatFell, pain, generalized weakness for 6 days EXAM: CT CERVICAL SPINE WITHOUT CONTRAST TECHNIQUE: Multidetector CT imaging of the cervical spine was performed without intravenous contrast. Multiplanar CT image reconstructions were also generated. COMPARISON:  03/01/2019 FINDINGS:  Alignment: Alignment is anatomic. Skull base and vertebrae: No acute fracture. Soft tissues and spinal canal: No prevertebral fluid or swelling. No visible canal hematoma. Disc levels:  No significant spondylosis or facet hypertrophy. Upper chest:  Airway is patent.  Lung apices are clear. Other: Reconstructed images demonstrate no additional findings. IMPRESSION: 1. Unremarkable cervical spine.  No acute fracture. Electronically Signed   By: Randa Ngo M.D.   On: 02/18/2020 17:41   MR BRAIN WO CONTRAST  Result Date: 02/18/2020 CLINICAL DATA:  69 year old female with weakness and dizziness for 6 days. Decreased p.o. EXAM: MRI HEAD WITHOUT CONTRAST TECHNIQUE: Multiplanar, multiecho pulse sequences of the brain and surrounding structures were obtained without intravenous contrast. COMPARISON:  Head CT earlier today. FINDINGS: Brain: No restricted diffusion to suggest acute infarction. No midline shift, mass effect, evidence of mass lesion, ventriculomegaly, extra-axial collection or acute intracranial hemorrhage. Cervicomedullary junction and pituitary are within normal limits. Pearline Cables and white matter signal is within normal limits for age throughout the brain. No chronic cerebral blood products. Vascular: Major intracranial vascular flow voids are preserved. Skull and upper cervical spine: Negative for age visible cervical spine. Chronic hyperostosis of the calvarium. Visualized bone marrow signal is within normal limits. Sinuses/Orbits: Postoperative changes to both globes. Trace paranasal sinus mucosal thickening and right mastoid fluid. Other: Visible internal auditory structures appear normal. Normal stylomastoid foramina. Scalp and face soft tissues appear negative. IMPRESSION: No acute intracranial abnormality. Normal for age noncontrast MRI appearance of the brain. Electronically Signed   By: Genevie Ann M.D.   On: 02/18/2020 20:24   US Carotid Bilateral  Result Date: 02/19/2020 CLINICAL DATA:  Syncope  EXAM: BILATERAL CAROTID DUPLEX ULTRASOUND TECHNIQUE: Pearline Cables scale imaging, color Doppler and duplex ultrasound were performed of bilateral carotid and vertebral arteries in the neck. COMPARISON:  None. FINDINGS: Criteria: Quantification of carotid stenosis is based on velocity parameters that correlate the residual internal carotid diameter with NASCET-based stenosis levels, using the diameter of the distal internal carotid lumen as the denominator for stenosis measurement. The following velocity measurements were obtained: RIGHT ICA: 69/24 cm/sec CCA: 52/77 cm/sec SYSTOLIC ICA/CCA RATIO:  0.8 ECA:  66 cm/sec LEFT ICA: 63/21 cm/sec CCA: 82/42 cm/sec SYSTOLIC ICA/CCA RATIO:  0.9 ECA:  77 cm/sec RIGHT CAROTID ARTERY: No significant atherosclerotic plaque or evidence of stenosis in the internal carotid artery. RIGHT VERTEBRAL ARTERY:  Patent with normal antegrade flow. LEFT CAROTID ARTERY: No significant atherosclerotic plaque or evidence of stenosis in the internal carotid artery. LEFT VERTEBRAL ARTERY:  Patent with normal antegrade flow. IMPRESSION: 1. No significant atherosclerotic plaque or stenosis in either internal carotid artery. 2. Vertebral arteries are patent with normal antegrade flow. Signed, Criselda Peaches, MD, [facility]iology Electronically Signed   By: Jacqulynn Cadet M.D.   On: 02/19/2020 16:01   DG CHEST PORT 1 VIEW  Result Date: 02/18/2020 CLINICAL DATA:  Generalized weakness EXAM: PORTABLE CHEST 1 VIEW COMPARISON:  10/20/2019 FINDINGS: The heart size and mediastinal contours are within normal limits. Both lungs are clear. The visualized skeletal structures are unremarkable. IMPRESSION: No active disease. Electronically Signed   By: Constance Holster M.D.   On: 02/18/2020 22:46   ECHOCARDIOGRAM COMPLETE  Result Date: 02/19/2020    ECHOCARDIOGRAM REPORT   Patient Name:   Diane [name] Date of Exam: 02/19/2020 Medical Rec #:   [id number]     Height:       63.0 in Accession #:    [id number]    Weight:       198.2 lb Date of Birth:  06/08/1951    BSA:          1.926 m Patient Age:  68 years      BP:           140/58 mmHg Patient Gender: F             HR:           60 bpm. [facility] Procedure: 2D Echo Indications:    Syncope 780.2 / R55  History:        Patient has no prior history of Echocardiogram examinations.                 Signs/Symptoms:Syncope; Risk Factors:Dyslipidemia, Former Smoker                 and Hypertension. Bipolar.  Sonographer:    Jeryl Columbia RDCS (AE) Referring Phys: [id number] DAVID MANUEL ORTIZ IMPRESSIONS  1. Left ventricular ejection fraction, by estimation, is 55%. The left ventricle has normal function. The left ventricle has no regional wall motion abnormalities. There is moderate left ventricular hypertrophy. Left ventricular diastolic parameters were normal.  2. Right ventricular systolic function is normal. The right ventricular size is normal. There is normal pulmonary artery systolic pressure.  3. The mitral valve is normal in structure. No evidence of mitral valve regurgitation. No evidence of mitral stenosis.  4. The aortic valve is tricuspid. Aortic valve regurgitation is not visualized. Mild aortic valve sclerosis is present, with no evidence of aortic valve stenosis.  5. The inferior vena cava is normal in size with greater than 50% respiratory variability, suggesting right atrial pressure of 3 mmHg. FINDINGS  Left Ventricle: Left ventricular ejection fraction, by estimation, is 55%. The left ventricle has normal function. The left ventricle has no regional wall motion abnormalities. The left ventricular internal cavity size was normal in size. There is moderate left ventricular hypertrophy. Left ventricular diastolic parameters were normal. Right Ventricle: The right ventricular size is normal. No increase in right ventricular wall thickness. Right ventricular systolic function is  normal. There is normal pulmonary artery systolic pressure. The tricuspid regurgitant velocity is 2.16 m/s, and  with an assumed right atrial pressure of 10 mmHg, the estimated right ventricular systolic pressure is 28.7 mmHg. Left Atrium: Left atrial size was normal in size. Right Atrium: Right atrial size was normal in size. Pericardium: There is no evidence of pericardial effusion. Mitral Valve: The mitral valve is normal in structure. Normal mobility of the mitral valve leaflets. No evidence of mitral valve regurgitation. No evidence of mitral valve stenosis. Tricuspid Valve: The tricuspid valve is normal in structure. Tricuspid valve regurgitation is not demonstrated. No evidence of tricuspid stenosis. Aortic Valve: The aortic valve is tricuspid. Aortic valve regurgitation is not visualized. Mild aortic valve sclerosis is present, with no evidence of aortic valve stenosis. Pulmonic Valve: The pulmonic valve was normal in structure. Pulmonic valve regurgitation is not visualized. No evidence of pulmonic stenosis. Aorta: The aortic root is normal in size and structure. Venous: The inferior vena cava is normal in size with greater than 50% respiratory variability, suggesting right atrial pressure of 3 mmHg. IAS/Shunts: No atrial level shunt detected by color flow Doppler.  LEFT VENTRICLE PLAX 2D LVIDd:         3.67 cm  Diastology LVIDs:         2.13 cm  LV e' lateral:   6.24 cm/s LV PW:         1.44 cm  LV E/e' lateral: 6.8 LV IVS:        1.50 cm  LV e' medial:  4.70 cm/s LVOT diam:     1.95 cm  LV E/e' medial:  9.0 LVOT Area:     2.99 cm  RIGHT VENTRICLE RV S prime:     7.30 cm/s TAPSE (M-mode): 1.0 cm LEFT ATRIUM           Index       RIGHT ATRIUM          Index LA diam:      2.70 cm 1.40 cm/m  RA Area:     9.32 cm LA Vol (A2C): 26.3 ml 13.65 ml/m RA Volume:   19.70 ml 10.23 ml/m LA Vol (A4C): 12.8 ml 6.65 ml/m   AORTA Ao Root diam: 2.70 cm MITRAL VALVE               TRICUSPID VALVE MV Area (PHT): 2.09  cm    TR Peak grad:   18.7 mmHg MV Decel Time: 363 msec    TR Vmax:        216.00 cm/s MV E velocity: 42.20 cm/s MV A velocity: 47.10 cm/s  SHUNTS MV E/A ratio:  0.90        Systemic Diam: 1.95 cm Charlton Haws MD Electronically signed by Charlton Haws MD Signature Date/Time: 02/19/2020/2:40:12 PM    Final     Micro Results    Recent Results (from the past 240 hour(s))  SARS Coronavirus 2 by RT PCR (hospital order, performed in [facility] hospital lab) Nasopharyngeal Nasopharyngeal Swab     Status: None   Collection Time: 02/18/20  9:35 PM   Specimen: Nasopharyngeal Swab  Result Value Ref Range Status   SARS Coronavirus 2 NEGATIVE NEGATIVE Final    Comment: (NOTE) SARS-CoV-2 target nucleic acids are NOT DETECTED. The SARS-CoV-2 RNA is generally detectable in upper and lower respiratory specimens during the acute phase of infection. The lowest concentration of SARS-CoV-2 viral copies this assay can detect is 250 copies / mL. A negative result does not preclude SARS-CoV-2 infection and should not be used as the sole basis for treatment or other patient management decisions.  A negative result may occur with improper specimen collection / handling, submission of specimen other than nasopharyngeal swab, presence of viral mutation(s) within the areas targeted by this assay, and inadequate number of viral copies (<250 copies / mL). A negative result must be combined with clinical observations, patient history, and epidemiological information. Fact Sheet for Patients:   BoilerBrush.com.cy Fact Sheet for Healthcare Providers: https://pope.com/ This test is not yet approved or cleared  by the [place] FDA and has been authorized for detection and/or diagnosis of SARS-CoV-2 by FDA under an Emergency Use Authorization (EUA).  This EUA will remain in effect (meaning this test can be used) for the duration of the COVID-19 declaration under  Section 564(b)(1) of the Act, 21 U.S.C. section 360bbb-3(b)(1), unless the authorization is terminated or revoked sooner. Performed at [facility], [address] [place], [place] [address]        Today   Subjective    Diane [name] today has no new complaints -Eating and drinking okay No fever  Or chills   No Nausea, Vomiting or Diarrhea          Patient has been seen and examined prior to discharge   Objective   Blood pressure 119/70, pulse 66, temperature 98.1 F (36.7 C), temperature source Oral, resp. rate 17, height 5\' 3"  (1.6 m), weight 89.9 kg, SpO2 97 %.   Intake/Output Summary (Last 24 hours) at  02/19/2020 1712 Last data filed at 02/19/2020 0554 Gross per 24 hour  Intake 255.09 ml  Output 400 ml  Net -144.91 ml    Exam Gen:- Awake Alert, no acute distress  HEENT:- [email or web address].[email or web address], No sclera icterus Neck-Supple Neck,No JVD,.  Lungs-  CTAB , good air movement bilaterally  CV- S1, S2 normal, regular Abd-  +ve B.Sounds, Abd Soft, No tenderness,    Extremity/Skin:- No  edema,   good pulses Psych-affect is appropriate, oriented x3 Neuro-generalized weakness no new focal deficits, no tremors    Data Review   CBC w Diff:  Lab Results  Component Value Date   WBC 7.2 02/19/2020   HGB 10.9 (L) 02/19/2020   HCT 34.8 (L) 02/19/2020   PLT 173 02/19/2020   LYMPHOPCT 36 02/18/2020   MONOPCT 12 02/18/2020   EOSPCT 0 02/18/2020   BASOPCT 0 02/18/2020    CMP:  Lab Results  Component Value Date   NA 140 02/19/2020   K 3.8 02/19/2020   CL 104 02/19/2020   CO2 28 02/19/2020   BUN 14 02/19/2020   CREATININE 1.02 (H) 02/19/2020   CREATININE 0.78 10/20/2019   PROT 6.6 02/18/2020   ALBUMIN 3.5 02/18/2020   BILITOT 0.7 02/18/2020   ALKPHOS 45 02/18/2020   AST 14 (L) 02/18/2020   ALT 11 02/18/2020  .   Total Discharge time is about 33 minutes  Shon Hale M.D on 02/19/2020 at 5:12 PM  Go to [email or web address] -  for contact info  [facility] - Office   [phone]

## 2020-02-19 NOTE — Progress Notes (Signed)
Received pt. Sitting in bed, tele off, IV dc'd,.  Waiting on ride to come.

## 2020-02-19 NOTE — Progress Notes (Signed)
Unable to perform orthostatic VS due to patient is shaking and very weak. Unsteady to stand up. Will try again

## 2020-02-19 NOTE — Discharge Instructions (Signed)
1)Follow-up with your primary care physician for reevaluation in 1 to 2 weeks 2)Please call [phone] on Friday, 02/20/2020 after 3 PM to get results of the urine culture test

## 2020-02-19 NOTE — Progress Notes (Signed)
2D echo complete 

## 2020-02-19 NOTE — Plan of Care (Signed)
  Problem: Education: Goal: Knowledge of General Education information will improve Description: Including pain rating scale, medication(s)/side effects and non-pharmacologic comfort measures Outcome: Progressing   Problem: Clinical Measurements: Goal: Cardiovascular complication will be avoided Outcome: Progressing   Problem: Clinical Measurements: Goal: Respiratory complications will improve Outcome: Progressing   Problem: Coping: Goal: Level of anxiety will decrease Outcome: Progressing   Problem: Elimination: Goal: Will not experience complications related to bowel motility Outcome: Progressing   Problem: Skin Integrity: Goal: Risk for impaired skin integrity will decrease Outcome: Progressing   Problem: Education: Goal: Knowledge of condition and prescribed therapy will improve Outcome: Progressing   Problem: Safety: Goal: Ability to remain free from injury will improve Outcome: Progressing

## 2020-02-23 ENCOUNTER — Other Ambulatory Visit: Payer: Self-pay

## 2020-02-23 ENCOUNTER — Encounter: Payer: Self-pay | Admitting: Cardiology

## 2020-02-23 DIAGNOSIS — R2689 Other abnormalities of gait and mobility: Secondary | ICD-10-CM | POA: Diagnosis not present

## 2020-02-23 DIAGNOSIS — R55 Syncope and collapse: Secondary | ICD-10-CM | POA: Diagnosis not present

## 2020-02-23 DIAGNOSIS — I10 Essential (primary) hypertension: Secondary | ICD-10-CM | POA: Diagnosis not present

## 2020-02-23 DIAGNOSIS — E559 Vitamin D deficiency, unspecified: Secondary | ICD-10-CM | POA: Diagnosis not present

## 2020-02-23 DIAGNOSIS — G894 Chronic pain syndrome: Secondary | ICD-10-CM

## 2020-02-23 DIAGNOSIS — G4730 Sleep apnea, unspecified: Secondary | ICD-10-CM | POA: Diagnosis not present

## 2020-02-23 DIAGNOSIS — R5383 Other fatigue: Secondary | ICD-10-CM | POA: Diagnosis not present

## 2020-02-23 DIAGNOSIS — E538 Deficiency of other specified B group vitamins: Secondary | ICD-10-CM | POA: Diagnosis not present

## 2020-02-23 DIAGNOSIS — Z1389 Encounter for screening for other disorder: Secondary | ICD-10-CM | POA: Diagnosis not present

## 2020-02-24 MED ORDER — OXYCODONE-ACETAMINOPHEN 7.5-325 MG PO TABS: ORAL_TABLET | ORAL | 0 refills | Status: DC

## 2020-02-25 ENCOUNTER — Other Ambulatory Visit (HOSPITAL_COMMUNITY): Payer: Self-pay | Admitting: Internal Medicine

## 2020-02-25 DIAGNOSIS — Z1231 Encounter for screening mammogram for malignant neoplasm of breast: Secondary | ICD-10-CM

## 2020-02-25 DIAGNOSIS — E2839 Other primary ovarian failure: Secondary | ICD-10-CM

## 2020-03-01 ENCOUNTER — Other Ambulatory Visit: Payer: Self-pay

## 2020-03-01 ENCOUNTER — Other Ambulatory Visit: Payer: Self-pay | Admitting: Orthopedic Surgery

## 2020-03-01 DIAGNOSIS — G894 Chronic pain syndrome: Secondary | ICD-10-CM

## 2020-03-01 MED ORDER — OXYCODONE-ACETAMINOPHEN 7.5-325 MG PO TABS: ORAL_TABLET | ORAL | 0 refills | Status: DC

## 2020-03-01 NOTE — Telephone Encounter (Signed)
Patient called for refill:  oxyCODONE-acetaminophen (PERCOCET) 7.5-325 MG tablet 42 tablet  -[name], [employer]

## 2020-03-02 MED ORDER — OXYCODONE-ACETAMINOPHEN 7.5-325 MG PO TABS: ORAL_TABLET | ORAL | 0 refills | Status: DC

## 2020-03-03 ENCOUNTER — Ambulatory Visit: Payer: Medicare Other | Admitting: Gastroenterology

## 2020-03-04 ENCOUNTER — Other Ambulatory Visit (HOSPITAL_COMMUNITY): Payer: Self-pay | Admitting: Psychiatry

## 2020-03-05 NOTE — Telephone Encounter (Signed)
Call for earlier appt

## 2020-03-11 ENCOUNTER — Other Ambulatory Visit: Payer: Self-pay

## 2020-03-11 DIAGNOSIS — G894 Chronic pain syndrome: Secondary | ICD-10-CM

## 2020-03-12 NOTE — Telephone Encounter (Signed)
Earlier appt was made with patient

## 2020-03-16 ENCOUNTER — Encounter (HOSPITAL_COMMUNITY): Payer: Self-pay | Admitting: Psychiatry

## 2020-03-16 ENCOUNTER — Telehealth (INDEPENDENT_AMBULATORY_CARE_PROVIDER_SITE_OTHER): Payer: Medicare Other | Admitting: Psychiatry

## 2020-03-16 ENCOUNTER — Other Ambulatory Visit: Payer: Self-pay

## 2020-03-16 DIAGNOSIS — F5105 Insomnia due to other mental disorder: Secondary | ICD-10-CM

## 2020-03-16 DIAGNOSIS — F3130 Bipolar disorder, current episode depressed, mild or moderate severity, unspecified: Secondary | ICD-10-CM

## 2020-03-16 MED ORDER — TRAZODONE HCL 100 MG PO TABS: ORAL_TABLET | ORAL | 2 refills | Status: DC

## 2020-03-16 MED ORDER — ALPRAZOLAM 0.5 MG PO TABS: ORAL_TABLET | ORAL | 2 refills | Status: DC

## 2020-03-16 MED ORDER — OXYCODONE-ACETAMINOPHEN 7.5-325 MG PO TABS: ORAL_TABLET | ORAL | 0 refills | Status: DC

## 2020-03-16 MED ORDER — DIVALPROEX SODIUM 250 MG PO DR TAB: DELAYED_RELEASE_TABLET | ORAL | 2 refills | Status: DC

## 2020-03-16 MED ORDER — FLUOXETINE HCL 20 MG PO CAPS: 20.0000 mg | ORAL_CAPSULE | Freq: Two times a day (BID) | ORAL | 2 refills | Status: DC

## 2020-03-16 NOTE — Progress Notes (Signed)
Virtual Visit via Telephone Note  I connected with Diane [name] on 03/16/20 at  4:30 PM EDT by telephone and verified that I am speaking with the correct person using two identifiers.   I discussed the limitations, risks, security and privacy concerns of performing an evaluation and management service by telephone and the availability of in person appointments. I also discussed with the patient that there may be a patient responsible charge related to this service. The patient expressed understanding and agreed to proceed.      I discussed the assessment and treatment plan with the patient. The patient was provided an opportunity to ask questions and all were answered. The patient agreed with the plan and demonstrated an understanding of the instructions.   The patient was advised to call back or seek an in-person evaluation if the symptoms worsen or if the condition fails to improve as anticipated.  I provided 15 minutes of non-face-to-face time during this encounter. Location: Provider office, patient home  Diane Diane [name] Diane [name] Kowal, MD  [facility] MD/PA/NP OP Progress Note  03/16/2020 4:49 PM Diane [name]  MRN:  [id number]  Chief Complaint:  Chief Complaint    Depression; Anxiety; Follow-up     HPI: This patient is a 69 year old black female who lives with her niece in [place].  She is on disability.  The patient and her niece return for follow-up after about 4 months.  The patient has a history of bipolar disorder.  Since I last saw her she was hospitalized last month after she became acutely dizzy and fell.  She was found to be slightly dehydrated and had a mild urinary tract infection.  Her brain CT and MRI were negative as were her carotid ultrasounds her EKG and echocardiogram.  Most of her labs were normal but her TSH was elevated to 7.1.  Her niece Diane [name] tells me that they have been back to see Dr. Carver FilaFrisco since the hospitalization and she is not sure if he has rechecked her thyroid but her thyroxine  dosage is still the same.  The patient tells me that she feels tired and weak and spends a lot of time in bed.  According to the niece she is not eating much and has lost quite a bit of weight.  Her weight is 198 and in 2018 it was 250.  Family states that she will drink smoothies but does not want to eat food.  She is not sure if this is because she cannot water does not want to.  I explained that thyroid abnormalities can have a lot of effects on the body so we need to get this straightened out before we go any further.  She is going to call Dr. Sherwood GamblerFusco and is also going to see why the home PT has not been set up. Visit Diagnosis:    ICD-10-CM   1. 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 Bipolar I disorder, most recent episode depressed (HCC)  F31.30 divalproex (DEPAKOTE) 250 MG DR tablet    Past Psychiatric History: The patient has a history of 1 psychiatric admission in 2000 and the behavioral health hospital.  At the time she was using drugs and alcohol.  She has been in outpatient ever since  Past Medical History:  Past Medical History:  Diagnosis Date  . Allergic rhinitis   . Anxiety   . Arthritis   . Cataracts, bilateral   . Chronic back pain  Sees pain mamagement clinic  . Chronic pain syndrome   . Constipation   . Depression   . GERD (gastroesophageal reflux disease)   . Headaches, cluster 3 yrs ago   last one  . High cholesterol   . Hypertension   . Hypothyroid   . Immature cataract   . Limp    right leg  . Mania (HCC)   . PTSD (post-traumatic stress disorder)   . Right lumbar radiculopathy   . Seizures (HCC) 20 yrs ago   unknown etiology-on meds  . Sleep apnea    STOP BANG score=4  . 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[facility])     Past Surgical History:  Procedure Laterality Date  . BACK SURGERY    . CATARACT EXTRACTION W/PHACO Left 01/04/2016   Procedure: CATARACT EXTRACTION PHACO AND INTRAOCULAR LENS PLACEMENT (IOC);   Surgeon: Jethro Bolus, MD;  Location: [facility];  Service: Ophthalmology;  Laterality: Left;  CDE:4.70  . CATARACT EXTRACTION W/PHACO Right 01/18/2016   Procedure: CATARACT EXTRACTION PHACO AND INTRAOCULAR LENS PLACEMENT RIGHT EYE; CDE:  4.66;  Surgeon: Jethro Bolus, MD;  Location: [facility];  Service: Ophthalmology;  Laterality: Right;  . CHOLECYSTECTOMY    . COLONOSCOPY  10/16/2008   CHY:IFOYDXAJOIN, otherwise normal rectum; pancolonic diverticula the remainder of the colonic mucosa appeared normal. Next TCS due 10/2013.  Marland Kitchen COLONOSCOPY  06/28/2006   RMR: Internal hemorrhoids. Diminutive rectal polyps, cold biopsied/ Pancolonic diverticula. Polyps in the right colon removed   . COLONOSCOPY WITH PROPOFOL N/A 05/20/2015   RMR: Pancolonic diverticulosis. Redundant colon   . ESOPHAGOGASTRODUODENOSCOPY  10/16/2008   OMV:EHMCN hiatal hernia otherwise normal esophagus, stomach  D1, D2, status post passage of a 56-French Maloney dilator  . ESOPHAGOGASTRODUODENOSCOPY  06/28/2006   OBS:JGGEZM esophagus. Small hiatal hernia. Otherwise normal stomach, D1 and D2, status post passage of a 56 [place] Maloney dilator  . ESOPHAGOGASTRODUODENOSCOPY (EGD) WITH ESOPHAGEAL DILATION N/A 07/14/2013   RMR: Abnormal esophagus of uncertain significance-status post esophageal biospy. small hiatal hernia.   Marland Kitchen ESOPHAGOGASTRODUODENOSCOPY (EGD) WITH PROPOFOL N/A 05/20/2015   RMR: NORMAL EGD status post maloney dilation   . ESOPHAGOGASTRODUODENOSCOPY (EGD) WITH PROPOFOL N/A 12/23/2018   Dr. Jena Gauss: normal esophagus, s/p esophageal dilation.   Marland Kitchen FOOT SURGERY     left foot-  . INCISION AND DRAINAGE Right 01/03/2013   Procedure: INCISION AND DRAINAGE;  Surgeon: Vickki Hearing, MD;  Location: [facility];  Service: Orthopedics;  Laterality: Right;  . KNEE ARTHROSCOPY  01/2012   right  . KNEE ARTHROSCOPY WITH LATERAL RELEASE Right 01/03/2013   Procedure: Lateral Release Patella Right Knee;  Surgeon: Vickki Hearing, MD;  Location: [facility];   Service: Orthopedics;  Laterality: Right;  . LAPAROSCOPIC LYSIS OF ADHESIONS N/A 11/12/2019   Procedure: LAPAROSCOPIC LYSIS OF ADHESIONS, extensive; drainage of peritoneal cyst;  Surgeon: Lazaro Arms, MD;  Location: [facility];  Service: Gynecology;  Laterality: N/A;  . left foot  x 2  . LUMBAR DISC SURGERY  L4-5  . MALONEY DILATION N/A 05/20/2015   Procedure: Elease Hashimoto DILATION;  Surgeon: Corbin Ade, MD;  Location: [facility];  Service: Endoscopy;  Laterality: N/A;  Maloney dilator # 54  . MALONEY DILATION N/A 12/23/2018   Procedure: Elease Hashimoto DILATION;  Surgeon: Corbin Ade, MD;  Location: [facility];  Service: Endoscopy;  Laterality: N/A;  . oophoectomy  bilateral  . PARTIAL HYSTERECTOMY    . TOTAL KNEE ARTHROPLASTY  07/01/2012   Procedure: TOTAL KNEE ARTHROPLASTY;  Surgeon: Fernande Boyden  Romeo Apple, MD;  Location: [facility];  Service: Orthopedics;  Laterality: Right;  . TOTAL KNEE REVISION Right 01/03/2013   Procedure: Patellaplasty Right Knee;  Surgeon: Vickki Hearing, MD;  Location: [facility];  Service: Orthopedics;  Laterality: Right;    Family Psychiatric History: see below  Family History:  Family History  Problem Relation Age of Onset  . Alcohol abuse Mother   . Anxiety disorder Mother   . Alcohol abuse Father   . Anxiety disorder Father   . Bipolar disorder Sister   . Alcohol abuse Brother   . Anxiety disorder Brother   . Drug abuse Brother   . Dementia Paternal Aunt   . ADD / ADHD Grandchild   . ADD / ADHD Grandchild   . Drug abuse Brother   . Alcohol abuse Brother   . Anxiety disorder Brother   . Seizures Brother   . Alcohol abuse Brother   . Anxiety disorder Brother   . Seizures Brother   . Alcohol abuse Brother   . Anxiety disorder Brother   . Alcohol abuse Brother   . Anxiety disorder Brother   . Heart disease Other   . Diabetes Other   . Alcohol abuse Other   . Depression Neg Hx   . OCD Neg Hx   . Paranoid behavior Neg Hx   . Schizophrenia Neg Hx   . Sexual  abuse Neg Hx   . Physical abuse Neg Hx   . Colon cancer Neg Hx     Social History:  Social History   Socioeconomic History  . Marital status: Legally Separated    Spouse name: Not on file  . Number of children: Not on file  . Years of education: 12th grade  . Highest education level: Not on file  Occupational History  . Occupation: disabled    Associate Professor: UNEMPLOYED  Tobacco Use  . Smoking status: Former Smoker    Packs/day: 0.25    Years: 5.00    Pack years: 1.25    Types: Cigarettes    Quit date: 06/26/2003    Years since quitting: 16.7  . Smokeless tobacco: Never Used  Vaping Use  . Vaping Use: Never used  Substance and Sexual Activity  . Alcohol use: Yes    Alcohol/week: 0.0 standard drinks    Comment: occasionally; once a month.  . Drug use: Not Currently    Comment: has a past history of street drug use  . Sexual activity: Never    Birth control/protection: Surgical  Other Topics Concern  . Not on file  Social History Narrative  . Not on file   Social Determinants of Health   Financial Resource Strain:   . Difficulty of Paying Living Expenses:   Food Insecurity:   . Worried About Programme researcher, broadcasting/film/video in the Last Year:   . Barista in the Last Year:   Transportation Needs:   . Freight forwarder (Medical):   Marland Kitchen Lack of Transportation (Non-Medical):   Physical Activity:   . Days of Exercise per Week:   . Minutes of Exercise per Session:   Stress:   . Feeling of Stress :   Social Connections:   . Frequency of Communication with Friends and Family:   . Frequency of Social Gatherings with Friends and Family:   . Attends Religious Services:   . Active Member of Clubs or Organizations:   . Attends Banker Meetings:   Marland Kitchen Marital Status:  Allergies:  Allergies  Allergen Reactions  . Neomycin-Bacitracin Zn-Polymyx Other (See Comments)    Reaction: skin starts to become raw and peels off  . Penicillins Hives  . Sulfonamide  Derivatives Hives and Swelling    Metabolic Disorder Labs: Lab Results  Component Value Date   HGBA1C 5.5 02/19/2020   MPG 111.15 02/19/2020   MPG 120 (H) 01/25/2012   No results found for: PROLACTIN Lab Results  Component Value Date   CHOL  12/10/2010    69        ATP III CLASSIFICATION:  <200     mg/dL   Desirable  308-657  mg/dL   Borderline High  >=846    mg/dL   High          TRIG 38 12/10/2010   HDL 28 (L) 12/10/2010   CHOLHDL 2.5 12/10/2010   VLDL 8 12/10/2010   LDLCALC  12/10/2010    33        Total Cholesterol/HDL:CHD Risk Coronary Heart Disease Risk Table                     Men   Women  1/2 Average Risk   3.4   3.3  Average Risk       5.0   4.4  2 X Average Risk   9.6   7.1  3 X Average Risk  23.4   11.0        Use the calculated Patient Ratio above and the CHD Risk Table to determine the patient's CHD Risk.        ATP III CLASSIFICATION (LDL):  <100     mg/dL   Optimal  962-952  mg/dL   Near or Above                    Optimal  130-159  mg/dL   Borderline  841-324  mg/dL   High  >401     mg/dL   Very High   LDLCALC 71 07/12/2009   Lab Results  Component Value Date   TSH 7.105 (H) 02/18/2020   TSH 0.52 02/11/2018    Therapeutic Level Labs: No results found for: LITHIUM Lab Results  Component Value Date   VALPROATE 70.7 09/24/2018   VALPROATE 71.5 01/30/2017   No components found for:  CBMZ  Current Medications: Current Outpatient Medications  Medication Sig Dispense Refill  . acetaminophen (TYLENOL) 325 MG tablet Take 2 tablets (650 mg total) by mouth every 6 (six) hours as needed for mild pain (or Fever >/= 101). 12 tablet 1  . ALPRAZolam (XANAX) 0.5 MG tablet TAKE ONE TABLET BY MOUTH TWICE DAILY AND TWO TABLETS AT BEDTIME. 120 tablet 2  . atorvastatin (LIPITOR) 20 MG tablet Take 20 mg by mouth daily.    . cyclobenzaprine (FLEXERIL) 10 MG tablet Take 1 tablet (10 mg total) by mouth 2 (two) times daily as needed for muscle spasms. 60  tablet 5  . divalproex (DEPAKOTE) 250 MG DR tablet TAKE THREE (3) TABLETS BY MOUTH AT BEDTIME. 90 tablet 2  . FLUoxetine (PROZAC) 20 MG capsule Take 1 capsule (20 mg total) by mouth 2 (two) times daily. 60 capsule 2  . hydrocortisone (ANUSOL-HC) 2.5 % rectal cream Place 1 application rectally 2 (two) times daily. 30 g 1  . levothyroxine (SYNTHROID, LEVOTHROID) 150 MCG tablet Take 150 mcg by mouth daily before breakfast.   5  . lisinopril (PRINIVIL,ZESTRIL) 10 MG tablet Take 10 mg by mouth  at bedtime.     Marland Kitchen lubiprostone (AMITIZA) 24 MCG capsule TAKE ONE CAPSULE BY MOUTH TWICE DAILY. TAKE WITH A MEAL. (Patient taking differently: Take 24 mcg by mouth 2 (two) times daily with a meal. ) 60 capsule 5  . meloxicam (MOBIC) 7.5 MG tablet Take 1 tablet (7.5 mg total) by mouth daily. 60 tablet 2  . oxyCODONE-acetaminophen (PERCOCET) 7.5-325 MG tablet One q 6 hrs prn pain 42 tablet 0  . pantoprazole (PROTONIX) 40 MG tablet TAKE ONE TABLET BY MOUTH TWICE DAILY. (Patient taking differently: Take 40 mg by mouth 2 (two) times daily. ) 60 tablet 5  . SYMPROIC 0.2 MG TABS TAKE ONE TABLET BY MOUTH DAILY FOR CONSTIPATION ON DAYS YOU TAKE YOUR PAIN MEDICATION. (Patient taking differently: Take 0.2 mg by mouth daily as needed (when taking oxycodone). ) 30 tablet 3  . traZODone (DESYREL) 100 MG tablet TAKE TWO (2) TABLETS BY MOUTH AT BEDTIME 60 tablet 2   No current facility-administered medications for this visit.     Musculoskeletal: Strength & Muscle Tone: within normal limits Gait & Station: normal Patient leans: N/A  Psychiatric Specialty Exam: Review of Systems  Constitutional: Positive for appetite change, fatigue and unexpected weight change.  Musculoskeletal: Positive for back pain.  Neurological: Positive for light-headedness.  Psychiatric/Behavioral: Positive for dysphoric mood.  All other systems reviewed and are negative.   There were no vitals taken for this visit.There is no height or weight  on file to calculate BMI.  General Appearance: NA  Eye Contact:  NA  Speech:  Clear and Coherent  Volume:  Normal  Mood:  Dysphoric  Affect:  NA  Thought Process:  Goal Directed  Orientation:  Full (Time, Place, and Person)  Thought Content: Rumination   Suicidal Thoughts:  No  Homicidal Thoughts:  No  Memory:  Immediate;   Good Recent;   Fair Remote;   Poor  Judgement:  Poor  Insight:  Shallow  Psychomotor Activity:  Decreased  Concentration:  Concentration: Poor and Attention Span: Poor  Recall:  [employer] of Knowledge: Fair  Language: Good  Akathisia:  No  Handed:  Right  AIMS (if indicated): not done  Assets:  Communication Skills Desire for Improvement Resilience Social Support  ADL's:  Intact  Cognition: WNL  Sleep:  Good   Screenings:   Assessment and Plan: This patient is a 69 year old female with a history of depression anxiety chronic pain and a remote history of alcohol abuse.  It is unclear what is really going on with her since most of her studies in the hospital were normal.  Her TSH is elevated and this needs to be explored.  Her niece will call her primary physician about this.  For now we will continue Prozac 20 mg twice daily for depression, Xanax 0.5 mg twice daily and 1 mg at bedtime for anxiety, Depakote 750 mg at bedtime for mood stabilization and trazodone 100 to 200 mg at bedtime for sleep.  She will return to see me in 4 weeks   Diane Ruder, MD 03/16/2020, 4:49 PM

## 2020-03-19 ENCOUNTER — Ambulatory Visit: Payer: Medicare Other | Admitting: Gastroenterology

## 2020-03-23 DIAGNOSIS — E063 Autoimmune thyroiditis: Secondary | ICD-10-CM | POA: Diagnosis not present

## 2020-03-23 DIAGNOSIS — G250 Essential tremor: Secondary | ICD-10-CM | POA: Diagnosis not present

## 2020-03-23 DIAGNOSIS — M1991 Primary osteoarthritis, unspecified site: Secondary | ICD-10-CM | POA: Diagnosis not present

## 2020-03-24 ENCOUNTER — Telehealth (HOSPITAL_COMMUNITY): Payer: Self-pay

## 2020-03-24 NOTE — Telephone Encounter (Signed)
Medication mangement - Spoke to Ms. Diane [name] who reported she was trying to contact Dr. Tenny Craw back who had tried to call her the previous week but she had to get a replacement phone. Requested Dr. Tenny Craw call back to discuss Diane [name] if she still needed to speak to her.

## 2020-03-29 DIAGNOSIS — R2689 Other abnormalities of gait and mobility: Secondary | ICD-10-CM | POA: Diagnosis not present

## 2020-03-31 ENCOUNTER — Ambulatory Visit (INDEPENDENT_AMBULATORY_CARE_PROVIDER_SITE_OTHER): Payer: Medicare Other

## 2020-03-31 DIAGNOSIS — N9489 Other specified conditions associated with female genital organs and menstrual cycle: Secondary | ICD-10-CM | POA: Diagnosis not present

## 2020-03-31 NOTE — Progress Notes (Signed)
PELVIC US TA/TV:normal vaginal cuff,bilat oophorectomy,right adnexa wnl,simple left adnexal cyst with posterior calcified  wall,no color flow visualized  5.7 x 3.8 x 4 cm,no free fluid,left adnexal pain during ultrasound

## 2020-04-02 ENCOUNTER — Telehealth: Payer: Self-pay | Admitting: Orthopedic Surgery

## 2020-04-02 ENCOUNTER — Encounter: Payer: Self-pay | Admitting: Cardiology

## 2020-04-02 ENCOUNTER — Other Ambulatory Visit: Payer: Self-pay

## 2020-04-02 ENCOUNTER — Ambulatory Visit (INDEPENDENT_AMBULATORY_CARE_PROVIDER_SITE_OTHER): Payer: Medicare Other | Admitting: Cardiology

## 2020-04-02 VITALS — BP 142/80 | HR 84 | Ht 63.0 in | Wt 195.8 lb

## 2020-04-02 DIAGNOSIS — I10 Essential (primary) hypertension: Secondary | ICD-10-CM | POA: Diagnosis not present

## 2020-04-02 DIAGNOSIS — R42 Dizziness and giddiness: Secondary | ICD-10-CM | POA: Diagnosis not present

## 2020-04-02 DIAGNOSIS — R55 Syncope and collapse: Secondary | ICD-10-CM | POA: Diagnosis not present

## 2020-04-02 NOTE — Telephone Encounter (Signed)
Call/voice message received asking for Dr Romeo Apple to call regarding concern about medication; gave ph#'s [phone] or [phone]

## 2020-04-02 NOTE — Progress Notes (Signed)
Cardiology Office Note  Date: 04/02/2020   ID: [name], [name] [birth year], MRN [id number]  PCP:  Elfredia Nevins, MD  Cardiologist:  Nona Dell, MD Electrophysiologist:  None   Chief Complaint  Patient presents with  . Orthostatic lightheadedness    History of Present Illness: Diane [name] is a 69 y.o. female referred for cardiology consultation by Dr. Sherwood Gambler for the evaluation of presyncope.  Chart review finds recent hospitalization in June with weakness and dizziness, reportedly had an episode of syncope while standing.  She was admitted for observation, no arrhythmias documented by telemetry.  Echocardiogram revealed normal LVEF and carotid Doppler showed no evidence of obstructive stenosis.  She did have a prolonged QTc by ECG.  She was also given fluids with suspected mild intravascular volume contraction and treated for UTI.  She is here today for evaluation with her niece.  She reports having orthostatic lightheadedness for quite some time, at least a period of months.  The episode of syncope was the only such event that she has had, usually she states that she feels lightheaded when she first stands up but is able to let the symptoms pass after standing still for a few moments.  She does not describe any sense of palpitations.  Blood pressure today is mildly elevated.  She is on lisinopril 10 mg daily.  She did have mild orthostatic change in systolic blood pressure but no substantial elevation in heart rate beyond the 80s, systolic went from the 140s supine to the 120s seated and in the 110-115 range standing.  I reviewed her medications which are outlined below.  Although Amitiza can be associated with hypotension, her blood pressure is not low today.  Chronic opioid use can be associated with autonomic dysfunction which is likely a source of her blood pressure change.  She is also on trazodone and Prozac, both of which can be associated with prolonged QT interval,  however I personally reviewed a repeat ECG today which shows sinus rhythm with nonspecific T wave changes and QTC normal at 390 ms.  She has pending consultation with neurology.  Past Medical History:  Diagnosis Date  . Allergic rhinitis   . Anxiety   . Arthritis   . Cataracts, bilateral   . Chronic back pain    Sees pain mamagement clinic  . Chronic pain syndrome   . Constipation   . Depression   . Essential hypertension   . GERD (gastroesophageal reflux disease)   . Headaches, cluster   . History of seizures   . Hypothyroid   . Mania (HCC)   . Mixed hyperlipidemia   . PTSD (post-traumatic stress disorder)   . Right lumbar radiculopathy   . Sleep apnea    STOP BANG score=4  . 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[facility])     Past Surgical History:  Procedure Laterality Date  . BACK SURGERY    . BILATERAL OOPHORECTOMY    . CATARACT EXTRACTION W/PHACO Left 01/04/2016   Procedure: CATARACT EXTRACTION PHACO AND INTRAOCULAR LENS PLACEMENT (IOC);  Surgeon: Jethro Bolus, MD;  Location: [facility];  Service: Ophthalmology;  Laterality: Left;  CDE:4.70  . CATARACT EXTRACTION W/PHACO Right 01/18/2016   Procedure: CATARACT EXTRACTION PHACO AND INTRAOCULAR LENS PLACEMENT RIGHT EYE; CDE:  4.66;  Surgeon: Jethro Bolus, MD;  Location: [facility];  Service: Ophthalmology;  Laterality: Right;  . CHOLECYSTECTOMY    . COLONOSCOPY  10/16/2008   GQB:[id number], otherwise normal rectum; pancolonic diverticula the remainder of the colonic mucosa  appeared normal. Next TCS due 10/2013.  Marland Kitchen. COLONOSCOPY  06/28/2006   RMR: Internal hemorrhoids. Diminutive rectal polyps, cold biopsied/ Pancolonic diverticula. Polyps in the right colon removed   . COLONOSCOPY WITH PROPOFOL N/A 05/20/2015   RMR: Pancolonic diverticulosis. Redundant colon   . ESOPHAGOGASTRODUODENOSCOPY  10/16/2008   YNW:GNFAORMR:Small hiatal hernia otherwise normal esophagus, stomach  D1, D2, status post passage of a 56-French Maloney dilator  .  ESOPHAGOGASTRODUODENOSCOPY  06/28/2006   ZHY:QMVHQIRMR:Normal esophagus. Small hiatal hernia. Otherwise normal stomach, D1 and D2, status post passage of a 56 [place] Maloney dilator  . ESOPHAGOGASTRODUODENOSCOPY (EGD) WITH ESOPHAGEAL DILATION N/A 07/14/2013   RMR: Abnormal esophagus of uncertain significance-status post esophageal biospy. small hiatal hernia.   Marland Kitchen. ESOPHAGOGASTRODUODENOSCOPY (EGD) WITH PROPOFOL N/A 05/20/2015   RMR: NORMAL EGD status post maloney dilation   . ESOPHAGOGASTRODUODENOSCOPY (EGD) WITH PROPOFOL N/A 12/23/2018   Dr. Jena Gaussrourk: normal esophagus, s/p esophageal dilation.   Marland Kitchen. FOOT SURGERY     left foot-  . INCISION AND DRAINAGE Right 01/03/2013   Procedure: INCISION AND DRAINAGE;  Surgeon: Vickki HearingStanley E Harrison, MD;  Location: [facility];  Service: Orthopedics;  Laterality: Right;  . KNEE ARTHROSCOPY  01/2012   right  . KNEE ARTHROSCOPY WITH LATERAL RELEASE Right 01/03/2013   Procedure: Lateral Release Patella Right Knee;  Surgeon: Vickki HearingStanley E Harrison, MD;  Location: [facility];  Service: Orthopedics;  Laterality: Right;  . LAPAROSCOPIC LYSIS OF ADHESIONS N/A 11/12/2019   Procedure: LAPAROSCOPIC LYSIS OF ADHESIONS, extensive; drainage of peritoneal cyst;  Surgeon: Lazaro ArmsEure, Luther H, MD;  Location: [facility];  Service: Gynecology;  Laterality: N/A;  . LUMBAR DISC SURGERY  L4-5  . MALONEY DILATION N/A 05/20/2015   Procedure: Elease HashimotoMALONEY DILATION;  Surgeon: Corbin Adeobert M Rourk, MD;  Location: [facility];  Service: Endoscopy;  Laterality: N/A;  Maloney dilator # 54  . MALONEY DILATION N/A 12/23/2018   Procedure: Elease HashimotoMALONEY DILATION;  Surgeon: Corbin Adeourk, Robert M, MD;  [facility];  Service: Endoscopy;  Laterality: N/A;  . PARTIAL HYSTERECTOMY    . TOTAL KNEE ARTHROPLASTY  07/01/2012   Procedure: TOTAL KNEE ARTHROPLASTY;  Surgeon: Vickki HearingStanley E Harrison, MD;  Location: [facility];  Service: Orthopedics;  Laterality: Right;  . TOTAL KNEE REVISION Right 01/03/2013   Procedure: Patellaplasty Right Knee;  Surgeon: Vickki HearingStanley E Harrison,  MD;  Location: [facility];  Service: Orthopedics;  Laterality: Right;    Current Outpatient Medications  Medication Sig Dispense Refill  . ALPRAZolam (XANAX) 0.5 MG tablet TAKE ONE TABLET BY MOUTH TWICE DAILY AND TWO TABLETS AT BEDTIME. 120 tablet 2  . atorvastatin (LIPITOR) 20 MG tablet Take 20 mg by mouth daily.    . cyclobenzaprine (FLEXERIL) 10 MG tablet Take 1 tablet (10 mg total) by mouth 2 (two) times daily as needed for muscle spasms. 60 tablet 5  . divalproex (DEPAKOTE) 250 MG DR tablet TAKE THREE (3) TABLETS BY MOUTH AT BEDTIME. 90 tablet 2  . FLUoxetine (PROZAC) 20 MG capsule Take 1 capsule (20 mg total) by mouth 2 (two) times daily. 60 capsule 2  . hydrocortisone (ANUSOL-HC) 2.5 % rectal cream Place 1 application rectally 2 (two) times daily. 30 g 1  . lisinopril (PRINIVIL,ZESTRIL) 10 MG tablet Take 10 mg by mouth at bedtime.     Marland Kitchen. lubiprostone (AMITIZA) 24 MCG capsule TAKE ONE CAPSULE BY MOUTH TWICE DAILY. TAKE WITH A MEAL. 60 capsule 5  . meloxicam (MOBIC) 7.5 MG tablet Take 1 tablet (7.5 mg total) by mouth daily. 60 tablet 2  .  oxyCODONE-acetaminophen (PERCOCET) 7.5-325 MG tablet One q 6 hrs prn pain 42 tablet 0  . pantoprazole (PROTONIX) 40 MG tablet TAKE ONE TABLET BY MOUTH TWICE DAILY. 60 tablet 5  . SYMPROIC 0.2 MG TABS TAKE ONE TABLET BY MOUTH DAILY FOR CONSTIPATION ON DAYS YOU TAKE YOUR PAIN MEDICATION. 30 tablet 3  . traZODone (DESYREL) 100 MG tablet TAKE TWO (2) TABLETS BY MOUTH AT BEDTIME 60 tablet 2  . levothyroxine (SYNTHROID) 125 MCG tablet Take 125 mcg by mouth daily.     No current facility-administered medications for this visit.   Allergies:  Neomycin-bacitracin zn-polymyx, Penicillins, and Sulfonamide derivatives   Social History: The patient  reports that she quit smoking about 16 years ago. Her smoking use included cigarettes. She has a 1.25 pack-year smoking history. She has never used smokeless tobacco. She reports current alcohol use. She reports previous drug  use.   Family History: The patient's family history includes ADD / ADHD in her grandchild and grandchild; Alcohol abuse in her brother, brother, brother, brother, brother, father, mother, and another family member; Anxiety disorder in her brother, brother, brother, brother, brother, father, and mother; Bipolar disorder in her sister; Dementia in her paternal aunt; Diabetes in an other family member; Drug abuse in her brother and brother; Heart disease in an other family member; Seizures in her brother and brother.   ROS:   No exertional chest pain or palpitations.  Physical Exam: VS:  BP (!) 142/80   Pulse 84   Ht 5\' 3"  (1.6 m)   Wt 195 lb 12.8 oz (88.8 kg)   SpO2 99%   BMI 34.68 kg/m , BMI Body mass index is 34.68 kg/m.  Wt Readings from Last 3 Encounters:  04/02/20 195 lb 12.8 oz (88.8 kg)  02/19/20 198 lb 3.1 oz (89.9 kg)  12/10/19 206 lb 12.8 oz (93.8 kg)    General: Patient appears comfortable at rest.  Using a rolling walker. HEENT: Conjunctiva and lids normal, wearing a mask. Neck: Supple, no elevated JVP or carotid bruits, no thyromegaly. Lungs: Clear to auscultation, nonlabored breathing at rest. Cardiac: Regular rate and rhythm, no S3 or significant systolic murmur, no pericardial rub. Extremities: No pitting edema, distal pulses 2+. Skin: Warm and dry. Musculoskeletal: No kyphosis. Neuropsychiatric: Alert and oriented x3, affect somewhat flat.  ECG:  An ECG dated 02/18/2020 was personally reviewed today and demonstrated:  Sinus rhythm with suspected arm lead reversal, nonspecific T wave changes, prolonged QTc.  Recent Labwork: 02/18/2020: ALT 11; AST 14; Magnesium 1.7; TSH 7.105 02/19/2020: BUN 14; Creatinine, Ser 1.02; Hemoglobin 10.9; Platelets 173; Potassium 3.8; Sodium 140  June 2021: Hemoglobin 12.6, platelets 227, BUN 15, creatinine 1.11, potassium 4.2, AST 11, ALT 8, random cortisol 7.4, vitamin B12 830 May 2021: Cholesterol 159, triglycerides 126, HDL 49, LDL 88,  TSH 0.437  Other Studies Reviewed Today:  Echocardiogram 02/19/2020: 1. Left ventricular ejection fraction, by estimation, is 55%. The left  ventricle has normal function. The left ventricle has no regional wall  motion abnormalities. There is moderate left ventricular hypertrophy. Left  ventricular diastolic parameters  were normal.  2. Right ventricular systolic function is normal. The right ventricular  size is normal. There is normal pulmonary artery systolic pressure.  3. The mitral valve is normal in structure. No evidence of mitral valve  regurgitation. No evidence of mitral stenosis.  4. The aortic valve is tricuspid. Aortic valve regurgitation is not  visualized. Mild aortic valve sclerosis is present, with no evidence of  aortic valve stenosis.  5. The inferior vena cava is normal in size with greater than 50%  respiratory variability, suggesting right atrial pressure of 3 mmHg.   Carotid Dopplers 02/19/2020: IMPRESSION: 1. No significant atherosclerotic plaque or stenosis in either internal carotid artery. 2. Vertebral arteries are patent with normal antegrade flow.  Assessment and Plan:  1.  Orthostatic dizziness including a single episode of syncope as discussed above.  Could have been contribution from mild intravascular volume depletion and active UTI, was given fluids during hospital observation.  No cardiac arrhythmias documented, LVEF normal at 55%, and carotid Dopplers without any significant stenosis.  Symptoms are noncardiac and most likely related to autonomic dysfunction perhaps in the setting of long-term opioid use (had mild orthostatic change today, but no hypotension).  While her QTc was prolonged by hospital ECG, her repeat tracing shows normal QTc today.  She is on both Prozac and trazodone, both of which can prolong QT interval and should be followed by her behavioral health specialist.  She has pending consultation with neurology.  I did ask her to cut  her lisinopril dose in half to see if this might ameliorate symptoms.  No further cardiac testing is planned, she should keep follow-up with PCP.  2.  Essential hypertension, on lisinopril per PCP.  I asked her to cut the dose in half as discussed above.  Medication Adjustments/Labs and Tests Ordered: Current medicines are reviewed at length with the patient today.  Concerns regarding medicines are outlined above.   Tests Ordered: Orders Placed This Encounter  Procedures  . EKG 12-Lead    Medication Changes: No orders of the defined types were placed in this encounter.   Disposition:  Follow up with PCP.  Signed, Jonelle Sidle, MD, [facility] 04/02/2020 12:12 PM    [facility] at [facility] [address] [place], [place], [place] [address] Phone: [phone]; Fax: [phone]

## 2020-04-02 NOTE — Patient Instructions (Addendum)
Medication Instructions:   Your physician has recommended you make the following change in your medication:   Please break your lisinopril in half daily. Follow up with your family doctor for management  Continue other medications the same  Labwork:  NONE  Testing/Procedures:  NONE  Follow-Up:  Your physician recommends that you schedule a follow-up appointment in: as needed.  Please follow up with your family doctor and neurologist.  Any Other Special Instructions Will Be Listed Below (If Applicable).  If you need a refill on your cardiac medications before your next appointment, please call your pharmacy.

## 2020-04-05 NOTE — Telephone Encounter (Signed)
Called left message for her to call back let me know what questions/ problems she is having

## 2020-04-06 ENCOUNTER — Other Ambulatory Visit: Payer: Self-pay

## 2020-04-06 DIAGNOSIS — G894 Chronic pain syndrome: Secondary | ICD-10-CM

## 2020-04-07 MED ORDER — OXYCODONE-ACETAMINOPHEN 7.5-325 MG PO TABS: ORAL_TABLET | ORAL | 0 refills | Status: DC

## 2020-04-07 NOTE — Telephone Encounter (Signed)
Patient has sent my chart message, I have forwarded to [facility]

## 2020-04-13 ENCOUNTER — Other Ambulatory Visit: Payer: Self-pay

## 2020-04-13 DIAGNOSIS — G894 Chronic pain syndrome: Secondary | ICD-10-CM

## 2020-04-14 MED ORDER — OXYCODONE-ACETAMINOPHEN 7.5-325 MG PO TABS: ORAL_TABLET | ORAL | 0 refills | Status: DC

## 2020-04-19 ENCOUNTER — Ambulatory Visit (INDEPENDENT_AMBULATORY_CARE_PROVIDER_SITE_OTHER): Payer: Medicare Other | Admitting: Obstetrics & Gynecology

## 2020-04-19 ENCOUNTER — Encounter: Payer: Self-pay | Admitting: Obstetrics & Gynecology

## 2020-04-19 VITALS — BP 119/75 | HR 98 | Ht 63.0 in | Wt 196.0 lb

## 2020-04-19 DIAGNOSIS — K668 Other specified disorders of peritoneum: Secondary | ICD-10-CM

## 2020-04-19 NOTE — Progress Notes (Signed)
Follow up appointment for results  Chief Complaint  Patient presents with  . Follow-up    Discuss [place] Results    Blood pressure 119/75, pulse 98, height 5\' 3"  (1.6 m), weight 196 lb (88.9 kg).  GYNECOLOGIC SONOGRAM   Diane [name] is a 69 y.o. 77 No LMP recorded. Patient has had a hysterectomy. and bilateral oophorectomy. She is here for a pelvic sonogram to f/u left adnexal peritoneal inclusion cyst.   Uterus                      Surgically removed,normal vaginal cuff  Endometrium          n/a  Right ovary             Oophorectomy,right adnexa WNL  Left ovary                Oophorectomy,simple left adnexal cyst with posterior calcified wall,no color flow visualized  5.7 x 3.8 x 4 cm  No free fluid   Technician Comments:  PELVIC [id number] TA/TV:normal vaginal cuff,bilat oophorectomy,right adnexa wnl,simple left adnexal cyst with posterior calcified  wall,no color flow visualized  5.7 x 3.8 x 4 cm,no free fluid,left adnexal pain during ultrasound    [place] 03/31/2020 5:19 PM  Clinical Impression and recommendations:  I have reviewed the sonogram results above, and compared with u/s of Aug 28, 2019. Patient is s/p laparoscopic lysis of adhesions.  Combined with the patient's current clinical course, below are my impressions and any appropriate recommendations for management based on the sonographic findings:   FINDINGS: single simple cyst in the left adnexa, slightly smaller than December views, with no internal flow noted .    IMPRESSION: Single simple cyst in the left adnexa,                                       Consistent with recurrence of peritoneal cyst.  January 04/04/2020    MEDS ordered this encounter: No orders of the defined types were placed in this encounter.   Orders for this encounter: No orders of the defined types were placed in this encounter.   Impression:   ICD-10-CM   1. 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       Face to face time:  10 minutes  Greater than 50% of the visit time was spent in counseling and coordination of care with the patient.  The summary and outline of the counseling and care coordination is summarized in the note above.   All questions were answered.  Past Medical History:  Diagnosis Date  . Allergic rhinitis   . Anxiety   . Arthritis   . Cataracts, bilateral   . Chronic back pain    Sees pain mamagement clinic  . Chronic pain syndrome   . Constipation   . Depression   . Essential hypertension   . GERD (gastroesophageal reflux disease)   . Headaches, cluster   . History of seizures   . Hypothyroid   . Mania (HCC)   . Mixed hyperlipidemia   . PTSD (post-traumatic stress disorder)   . Right lumbar radiculopathy   . Sleep apnea    STOP BANG score=4  . 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(  [facility])     Past Surgical History:  Procedure Laterality Date  . BACK SURGERY    . BILATERAL OOPHORECTOMY    . CATARACT EXTRACTION W/PHACO Left 01/04/2016   Procedure: CATARACT EXTRACTION PHACO AND INTRAOCULAR LENS PLACEMENT (IOC);  Surgeon: Jethro Bolus, MD;  Location: [facility];  Service: Ophthalmology;  Laterality: Left;  CDE:4.70  . CATARACT EXTRACTION W/PHACO Right 01/18/2016   Procedure: CATARACT EXTRACTION PHACO AND INTRAOCULAR LENS PLACEMENT RIGHT EYE; CDE:  4.66;  Surgeon: Jethro Bolus, MD;  Location: [facility];  Service: Ophthalmology;  Laterality: Right;  . CHOLECYSTECTOMY    . COLONOSCOPY  10/16/2008   BJS:[id number], otherwise normal rectum; pancolonic diverticula the remainder of the colonic mucosa appeared normal. Next TCS due 10/2013.  Marland Kitchen COLONOSCOPY  06/28/2006   RMR: Internal hemorrhoids. Diminutive rectal polyps, cold biopsied/ Pancolonic diverticula. Polyps in the right colon removed   . COLONOSCOPY WITH PROPOFOL N/A 05/20/2015   RMR: Pancolonic  diverticulosis. Redundant colon   . ESOPHAGOGASTRODUODENOSCOPY  10/16/2008   VPX:TGGYI hiatal hernia otherwise normal esophagus, stomach  D1, D2, status post passage of a 56-French Maloney dilator  . ESOPHAGOGASTRODUODENOSCOPY  06/28/2006   RSW:NIOEVO esophagus. Small hiatal hernia. Otherwise normal stomach, D1 and D2, status post passage of a 56 [place] Maloney dilator  . ESOPHAGOGASTRODUODENOSCOPY (EGD) WITH ESOPHAGEAL DILATION N/A 07/14/2013   RMR: Abnormal esophagus of uncertain significance-status post esophageal biospy. small hiatal hernia.   Marland Kitchen ESOPHAGOGASTRODUODENOSCOPY (EGD) WITH PROPOFOL N/A 05/20/2015   RMR: NORMAL EGD status post maloney dilation   . ESOPHAGOGASTRODUODENOSCOPY (EGD) WITH PROPOFOL N/A 12/23/2018   Dr. Jena Gauss: normal esophagus, s/p esophageal dilation.   Marland Kitchen FOOT SURGERY     left foot-  . INCISION AND DRAINAGE Right 01/03/2013   Procedure: INCISION AND DRAINAGE;  Surgeon: Vickki Hearing, MD;  Location: [facility];  Service: Orthopedics;  Laterality: Right;  . KNEE ARTHROSCOPY  01/2012   right  . KNEE ARTHROSCOPY WITH LATERAL RELEASE Right 01/03/2013   Procedure: Lateral Release Patella Right Knee;  Surgeon: Vickki Hearing, MD;  Location: [facility];  Service: Orthopedics;  Laterality: Right;  . LAPAROSCOPIC LYSIS OF ADHESIONS N/A 11/12/2019   Procedure: LAPAROSCOPIC LYSIS OF ADHESIONS, extensive; drainage of peritoneal cyst;  Surgeon: Lazaro Arms, MD;  Location: [facility];  Service: Gynecology;  Laterality: N/A;  . LUMBAR DISC SURGERY  L4-5  . MALONEY DILATION N/A 05/20/2015   Procedure: Elease Hashimoto DILATION;  Surgeon: Corbin Ade, MD;  Location: [facility];  Service: Endoscopy;  Laterality: N/A;  Maloney dilator # 54  . MALONEY DILATION N/A 12/23/2018   Procedure: Elease Hashimoto DILATION;  Surgeon: Corbin Ade, MD;  [facility];  Service: Endoscopy;  Laterality: N/A;  . PARTIAL HYSTERECTOMY    . TOTAL KNEE ARTHROPLASTY  07/01/2012   Procedure: TOTAL KNEE ARTHROPLASTY;   Surgeon: Vickki Hearing, MD;  Location: [facility];  Service: Orthopedics;  Laterality: Right;  . TOTAL KNEE REVISION Right 01/03/2013   Procedure: Patellaplasty Right Knee;  Surgeon: Vickki Hearing, MD;  Location: [facility];  Service: Orthopedics;  Laterality: Right;    OB History    Gravida  2   Para  2   Term  2   Preterm      AB      Living  2     SAB      IAB      Ectopic      Multiple      Live Births  Allergies  Allergen Reactions  . Neomycin-Bacitracin Zn-Polymyx Other (See Comments)    Reaction: skin starts to become raw and peels off  . Penicillins Hives  . Sulfonamide Derivatives Hives and Swelling    Social History   Socioeconomic History  . Marital status: Legally Separated    Spouse name: Not on file  . Number of children: Not on file  . Years of education: 12th grade  . Highest education level: Not on file  Occupational History  . Occupation: disabled    Associate Professor: UNEMPLOYED  Tobacco Use  . Smoking status: Former Smoker    Packs/day: 0.25    Years: 5.00    Pack years: 1.25    Types: Cigarettes    Quit date: 06/26/2003    Years since quitting: 17.2  . Smokeless tobacco: Never Used  Vaping Use  . Vaping Use: Never used  Substance and Sexual Activity  . Alcohol use: Yes    Alcohol/week: 0.0 standard drinks    Comment: occasionally; once a month.  . Drug use: Not Currently    Comment: has a past history of street drug use  . Sexual activity: Yes    Birth control/protection: Surgical  Other Topics Concern  . Not on file  Social History Narrative  . Not on file   Social Determinants of Health   Financial Resource Strain: Not on file  Food Insecurity: Not on file  Transportation Needs: Not on file  Physical Activity: Not on file  Stress: Not on file  Social Connections: Not on file    Family History  Problem Relation Age of Onset  . Alcohol abuse Mother   . Anxiety disorder Mother   . Alcohol abuse Father    . Anxiety disorder Father   . Bipolar disorder Sister   . Alcohol abuse Brother   . Anxiety disorder Brother   . Drug abuse Brother   . Dementia Paternal Aunt   . ADD / ADHD Grandchild   . ADD / ADHD Grandchild   . Drug abuse Brother   . Alcohol abuse Brother   . Anxiety disorder Brother   . Seizures Brother   . Alcohol abuse Brother   . Anxiety disorder Brother   . Seizures Brother   . Alcohol abuse Brother   . Anxiety disorder Brother   . Alcohol abuse Brother   . Anxiety disorder Brother   . Heart disease Other   . Diabetes Other   . Alcohol abuse Other   . Depression Neg Hx   . OCD Neg Hx   . Paranoid behavior Neg Hx   . Schizophrenia Neg Hx   . Sexual abuse Neg Hx   . Physical abuse Neg Hx   . Colon cancer Neg Hx

## 2020-04-20 ENCOUNTER — Other Ambulatory Visit: Payer: Self-pay

## 2020-04-20 DIAGNOSIS — G894 Chronic pain syndrome: Secondary | ICD-10-CM

## 2020-04-21 MED ORDER — OXYCODONE-ACETAMINOPHEN 7.5-325 MG PO TABS: ORAL_TABLET | ORAL | 0 refills | Status: DC

## 2020-04-22 DIAGNOSIS — K219 Gastro-esophageal reflux disease without esophagitis: Secondary | ICD-10-CM | POA: Diagnosis not present

## 2020-04-22 DIAGNOSIS — G8929 Other chronic pain: Secondary | ICD-10-CM | POA: Diagnosis not present

## 2020-04-22 DIAGNOSIS — Z96651 Presence of right artificial knee joint: Secondary | ICD-10-CM | POA: Diagnosis not present

## 2020-04-22 DIAGNOSIS — R2689 Other abnormalities of gait and mobility: Secondary | ICD-10-CM | POA: Diagnosis not present

## 2020-04-22 DIAGNOSIS — R5383 Other fatigue: Secondary | ICD-10-CM | POA: Diagnosis not present

## 2020-04-22 DIAGNOSIS — E063 Autoimmune thyroiditis: Secondary | ICD-10-CM | POA: Diagnosis not present

## 2020-04-22 DIAGNOSIS — I10 Essential (primary) hypertension: Secondary | ICD-10-CM | POA: Diagnosis not present

## 2020-04-22 DIAGNOSIS — F17200 Nicotine dependence, unspecified, uncomplicated: Secondary | ICD-10-CM | POA: Diagnosis not present

## 2020-04-27 ENCOUNTER — Other Ambulatory Visit: Payer: Self-pay

## 2020-04-27 DIAGNOSIS — G894 Chronic pain syndrome: Secondary | ICD-10-CM

## 2020-04-27 MED ORDER — OXYCODONE-ACETAMINOPHEN 7.5-325 MG PO TABS: ORAL_TABLET | ORAL | 0 refills | Status: DC

## 2020-04-28 DIAGNOSIS — G8929 Other chronic pain: Secondary | ICD-10-CM | POA: Diagnosis not present

## 2020-04-28 DIAGNOSIS — R5383 Other fatigue: Secondary | ICD-10-CM | POA: Diagnosis not present

## 2020-04-28 DIAGNOSIS — K219 Gastro-esophageal reflux disease without esophagitis: Secondary | ICD-10-CM | POA: Diagnosis not present

## 2020-04-28 DIAGNOSIS — I10 Essential (primary) hypertension: Secondary | ICD-10-CM | POA: Diagnosis not present

## 2020-04-28 DIAGNOSIS — R2689 Other abnormalities of gait and mobility: Secondary | ICD-10-CM | POA: Diagnosis not present

## 2020-04-28 DIAGNOSIS — E063 Autoimmune thyroiditis: Secondary | ICD-10-CM | POA: Diagnosis not present

## 2020-04-28 DIAGNOSIS — Z96651 Presence of right artificial knee joint: Secondary | ICD-10-CM | POA: Diagnosis not present

## 2020-04-28 DIAGNOSIS — F17200 Nicotine dependence, unspecified, uncomplicated: Secondary | ICD-10-CM | POA: Diagnosis not present

## 2020-04-29 ENCOUNTER — Telehealth (INDEPENDENT_AMBULATORY_CARE_PROVIDER_SITE_OTHER): Payer: Medicare Other | Admitting: Psychiatry

## 2020-04-29 ENCOUNTER — Other Ambulatory Visit: Payer: Self-pay

## 2020-04-29 ENCOUNTER — Encounter (HOSPITAL_COMMUNITY): Payer: Self-pay | Admitting: Psychiatry

## 2020-04-29 DIAGNOSIS — F3130 Bipolar disorder, current episode depressed, mild or moderate severity, unspecified: Secondary | ICD-10-CM | POA: Diagnosis not present

## 2020-04-29 DIAGNOSIS — F5105 Insomnia due to other mental disorder: Secondary | ICD-10-CM | POA: Diagnosis not present

## 2020-04-29 MED ORDER — ALPRAZOLAM 0.5 MG PO TABS: ORAL_TABLET | ORAL | 2 refills | Status: DC

## 2020-04-29 MED ORDER — TRAZODONE HCL 100 MG PO TABS: 100.0000 mg | ORAL_TABLET | Freq: Every day | ORAL | 2 refills | Status: DC

## 2020-04-29 MED ORDER — FLUOXETINE HCL 20 MG PO CAPS: 20.0000 mg | ORAL_CAPSULE | Freq: Two times a day (BID) | ORAL | 2 refills | Status: DC

## 2020-04-29 MED ORDER — DIVALPROEX SODIUM 250 MG PO DR TAB: DELAYED_RELEASE_TABLET | ORAL | 2 refills | Status: DC

## 2020-04-29 NOTE — Progress Notes (Signed)
Virtual Visit via Telephone Note  I connected with Diane [name] on 04/29/20 at  1:00 PM EDT by telephone and verified that I am speaking with the correct person using two identifiers.   I discussed the limitations, risks, security and privacy concerns of performing an evaluation and management service by telephone and the availability of in person appointments. I also discussed with the patient that there may be a patient responsible charge related to this service. The patient expressed understanding and agreed to proceed.    I discussed the assessment and treatment plan with the patient. The patient was provided an opportunity to ask questions and all were answered. The patient agreed with the plan and demonstrated an understanding of the instructions.   The patient was advised to call back or seek an in-person evaluation if the symptoms worsen or if the condition fails to improve as anticipated.  I provided 15 minutes of non-face-to-face time during this encounter. Location: Provider office, patient home  [name], MD  [facility] MD/PA/NP OP Progress Note  04/29/2020 1:23 PM Diane [name]  MRN:  [id number]  Chief Complaint:  Chief Complaint    Depression; Anxiety; Follow-up     HPI: This patient is a 69 year old black female who lives with her niece in [place]. She is on disability.  The patient returns after 6 weeks. I spoke to both the patient and her niece.  The niece reports that the patient is doing much better. She felt that she was overusing her medicine so she is taking control of them. She has also cut down her Depakote to 325 mg daily and cut her trazodone down to 100 mg at bedtime. With these changes the patient is more alert and awake. She is getting out of the house and is actually driving her vehicle. Her thyroid has been straightened out and her primary doctor has increased her Synthroid. She has also gone through physical therapy and this is all helped. At times she still has  memory issues but not most of the time. She is doing better with her eating. In discussion with the patient she seems to be in good spirits denies significant depression anxiety or confusion. Visit Diagnosis:    ICD-10-CM   1. Bipolar I disorder, most recent episode depressed (HCC)  F31.30 divalproex (DEPAKOTE) 250 MG DR tablet  2. 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 At that time she was using drugs and alcohol. She has been in outpatient care ever since  Past Medical History:  Past Medical History:  Diagnosis Date  . Allergic rhinitis   . Anxiety   . Arthritis   . Cataracts, bilateral   . Chronic back pain    Sees pain mamagement clinic  . Chronic pain syndrome   . Constipation   . Depression   . Essential hypertension   . GERD (gastroesophageal reflux disease)   . Headaches, cluster   . History of seizures   . Hypothyroid   . Mania (HCC)   . Mixed hyperlipidemia   . PTSD (post-traumatic stress disorder)   . Right lumbar radiculopathy   . Sleep apnea    STOP BANG score=4  . 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[facility])     Past Surgical History:  Procedure Laterality Date  . BACK SURGERY    . BILATERAL OOPHORECTOMY    .  CATARACT EXTRACTION W/PHACO Left 01/04/2016   Procedure: CATARACT EXTRACTION PHACO AND INTRAOCULAR LENS PLACEMENT (IOC);  Surgeon: Jethro BolusMark Shapiro, MD;  Location: [facility];  Service: Ophthalmology;  Laterality: Left;  CDE:4.70  . CATARACT EXTRACTION W/PHACO Right 01/18/2016   Procedure: CATARACT EXTRACTION PHACO AND INTRAOCULAR LENS PLACEMENT RIGHT EYE; CDE:  4.66;  Surgeon: Jethro BolusMark Shapiro, MD;  Location: [facility];  Service: Ophthalmology;  Laterality: Right;  . CHOLECYSTECTOMY    . COLONOSCOPY  10/16/2008   BJY:[id number]:Hemorrhoids, otherwise normal rectum; pancolonic diverticula the remainder of the colonic mucosa  appeared normal. Next TCS due 10/2013.  Marland Kitchen. COLONOSCOPY  06/28/2006   RMR: Internal hemorrhoids. Diminutive rectal polyps, cold biopsied/ Pancolonic diverticula. Polyps in the right colon removed   . COLONOSCOPY WITH PROPOFOL N/A 05/20/2015   RMR: Pancolonic diverticulosis. Redundant colon   . ESOPHAGOGASTRODUODENOSCOPY  10/16/2008   MVH:[id number]:Small hiatal hernia otherwise normal esophagus, stomach  D1, D2, status post passage of a 56-French Maloney dilator  . ESOPHAGOGASTRODUODENOSCOPY  06/28/2006   EXB:[id number]:Normal esophagus. Small hiatal hernia. Otherwise normal stomach, D1 and D2, status post passage of a 56 [place] Maloney dilator  . ESOPHAGOGASTRODUODENOSCOPY (EGD) WITH ESOPHAGEAL DILATION N/A 07/14/2013   RMR: Abnormal esophagus of uncertain significance-status post esophageal biospy. small hiatal hernia.   Marland Kitchen. ESOPHAGOGASTRODUODENOSCOPY (EGD) WITH PROPOFOL N/A 05/20/2015   RMR: NORMAL EGD status post maloney dilation   . ESOPHAGOGASTRODUODENOSCOPY (EGD) WITH PROPOFOL N/A 12/23/2018   Dr. Jena Gaussrourk: normal esophagus, s/p esophageal dilation.   Marland Kitchen. FOOT SURGERY     left foot-  . INCISION AND DRAINAGE Right 01/03/2013   Procedure: INCISION AND DRAINAGE;  Surgeon: Vickki HearingStanley E Harrison, MD;  Location: [facility];  Service: Orthopedics;  Laterality: Right;  . KNEE ARTHROSCOPY  01/2012   right  . KNEE ARTHROSCOPY WITH LATERAL RELEASE Right 01/03/2013   Procedure: Lateral Release Patella Right Knee;  Surgeon: Vickki HearingStanley E Harrison, MD;  Location: [facility];  Service: Orthopedics;  Laterality: Right;  . LAPAROSCOPIC LYSIS OF ADHESIONS N/A 11/12/2019   Procedure: LAPAROSCOPIC LYSIS OF ADHESIONS, extensive; drainage of peritoneal cyst;  Surgeon: Lazaro ArmsEure, Luther H, MD;  Location: [facility];  Service: Gynecology;  Laterality: N/A;  . LUMBAR DISC SURGERY  L4-5  . MALONEY DILATION N/A 05/20/2015   Procedure: Elease HashimotoMALONEY DILATION;  Surgeon: Corbin Adeobert M Rourk, MD;  Location: [facility];  Service: Endoscopy;  Laterality: N/A;  Maloney dilator # [id number]  . MALONEY  DILATION N/A 12/23/2018   Procedure: Elease HashimotoMALONEY DILATION;  Surgeon: Corbin Adeourk, Robert M, MD;  Location[facility];  Service: Endoscopy;  Laterality: N/A;  . PARTIAL HYSTERECTOMY    . TOTAL KNEE ARTHROPLASTY  07/01/2012   Procedure: TOTAL KNEE ARTHROPLASTY;  Surgeon: Vickki HearingStanley E Harrison, MD;  Location: [facility];  Service: Orthopedics;  Laterality: Right;  . TOTAL KNEE REVISION Right 01/03/2013   Procedure: Patellaplasty Right Knee;  Surgeon: Vickki HearingStanley E Harrison, MD;  Location: [facility];  Service: Orthopedics;  Laterality: Right;    Family Psychiatric History: see below  Family History:  Family History  Problem Relation Age of Onset  . Alcohol abuse Mother   . Anxiety disorder Mother   . Alcohol abuse Father   . Anxiety disorder Father   . Bipolar disorder Sister   . Alcohol abuse Brother   . Anxiety disorder Brother   . Drug abuse Brother   . Dementia Paternal Aunt   . ADD / ADHD Grandchild   . ADD / ADHD Grandchild   . Drug abuse Brother   . Alcohol abuse Brother   .  Anxiety disorder Brother   . Seizures Brother   . Alcohol abuse Brother   . Anxiety disorder Brother   . Seizures Brother   . Alcohol abuse Brother   . Anxiety disorder Brother   . Alcohol abuse Brother   . Anxiety disorder Brother   . Heart disease Other   . Diabetes Other   . Alcohol abuse Other   . Depression Neg Hx   . OCD Neg Hx   . Paranoid behavior Neg Hx   . Schizophrenia Neg Hx   . Sexual abuse Neg Hx   . Physical abuse Neg Hx   . Colon cancer Neg Hx     Social History:  Social History   Socioeconomic History  . Marital status: Legally Separated    Spouse name: Not on file  . Number of children: Not on file  . Years of education: 12th grade  . Highest education level: Not on file  Occupational History  . Occupation: disabled    Associate Professor: UNEMPLOYED  Tobacco Use  . Smoking status: Former Smoker    Packs/day: 0.25    Years: 5.00    Pack years: 1.25    Types: Cigarettes    Quit date:  06/26/2003    Years since quitting: 16.8  . Smokeless tobacco: Never Used  Vaping Use  . Vaping Use: Never used  Substance and Sexual Activity  . Alcohol use: Yes    Alcohol/week: 0.0 standard drinks    Comment: occasionally; once a month.  . Drug use: Not Currently    Comment: has a past history of street drug use  . Sexual activity: Yes    Birth control/protection: Surgical  Other Topics Concern  . Not on file  Social History Narrative  . Not on file   Social Determinants of Health   Financial Resource Strain:   . Difficulty of Paying Living Expenses: Not on file  Food Insecurity:   . Worried About Programme researcher, broadcasting/film/video in the Last Year: Not on file  . Ran Out of Food in the Last Year: Not on file  Transportation Needs:   . Lack of Transportation (Medical): Not on file  . Lack of Transportation (Non-Medical): Not on file  Physical Activity:   . Days of Exercise per Week: Not on file  . Minutes of Exercise per Session: Not on file  Stress:   . Feeling of Stress : Not on file  Social Connections:   . Frequency of Communication with Friends and Family: Not on file  . Frequency of Social Gatherings with Friends and Family: Not on file  . Attends Religious Services: Not on file  . Active Member of Clubs or Organizations: Not on file  . Attends Banker Meetings: Not on file  . Marital Status: Not on file    Allergies:  Allergies  Allergen Reactions  . Neomycin-Bacitracin Zn-Polymyx Other (See Comments)    Reaction: skin starts to become raw and peels off  . Penicillins Hives  . Sulfonamide Derivatives Hives and Swelling    Metabolic Disorder Labs: Lab Results  Component Value Date   HGBA1C 5.5 02/19/2020   MPG 111.15 02/19/2020   MPG 120 (H) 01/25/2012   No results found for: PROLACTIN Lab Results  Component Value Date   CHOL  12/10/2010    69        ATP III CLASSIFICATION:  <200     mg/dL   Desirable  683-419  mg/dL   Borderline High  >=  240     mg/dL   High          TRIG 38 12/10/2010   HDL 28 (L) 12/10/2010   CHOLHDL 2.5 12/10/2010   VLDL 8 12/10/2010   LDLCALC  12/10/2010    33        Total Cholesterol/HDL:CHD Risk Coronary Heart Disease Risk Table                     Men   Women  1/2 Average Risk   3.4   3.3  Average Risk       5.0   4.4  2 X Average Risk   9.6   7.1  3 X Average Risk  23.4   11.0        Use the calculated Patient Ratio above and the CHD Risk Table to determine the patient's CHD Risk.        ATP III CLASSIFICATION (LDL):  <100     mg/dL   Optimal  161-096  mg/dL   Near or Above                    Optimal  130-159  mg/dL   Borderline  045-409  mg/dL   High  >811     mg/dL   Very High   LDLCALC 71 07/12/2009   Lab Results  Component Value Date   TSH 7.105 (H) 02/18/2020   TSH 0.52 02/11/2018    Therapeutic Level Labs: No results found for: LITHIUM Lab Results  Component Value Date   VALPROATE 70.7 09/24/2018   VALPROATE 71.5 01/30/2017   No components found for:  CBMZ  Current Medications: Current Outpatient Medications  Medication Sig Dispense Refill  . ALPRAZolam (XANAX) 0.5 MG tablet TAKE ONE TABLET BY MOUTH TWICE DAILY AND TWO TABLETS AT BEDTIME. 120 tablet 2  . atorvastatin (LIPITOR) 20 MG tablet Take 20 mg by mouth daily.    . cyclobenzaprine (FLEXERIL) 10 MG tablet Take 1 tablet (10 mg total) by mouth 2 (two) times daily as needed for muscle spasms. 60 tablet 5  . divalproex (DEPAKOTE) 250 MG DR tablet .take one and one half at bedtime 45 tablet 2  . FLUoxetine (PROZAC) 20 MG capsule Take 1 capsule (20 mg total) by mouth 2 (two) times daily. 60 capsule 2  . hydrocortisone (ANUSOL-HC) 2.5 % rectal cream Place 1 application rectally 2 (two) times daily. 30 g 1  . levothyroxine (SYNTHROID) 125 MCG tablet Take 125 mcg by mouth daily.    Marland Kitchen lisinopril (PRINIVIL,ZESTRIL) 10 MG tablet Take 10 mg by mouth at bedtime.     Marland Kitchen lubiprostone (AMITIZA) 24 MCG capsule TAKE ONE CAPSULE BY MOUTH  TWICE DAILY. TAKE WITH A MEAL. 60 capsule 5  . meloxicam (MOBIC) 7.5 MG tablet Take 1 tablet (7.5 mg total) by mouth daily. 60 tablet 2  . oxyCODONE-acetaminophen (PERCOCET) 7.5-325 MG tablet One q 4 hrs prn pain 42 tablet 0  . pantoprazole (PROTONIX) 40 MG tablet TAKE ONE TABLET BY MOUTH TWICE DAILY. 60 tablet 5  . SYMPROIC 0.2 MG TABS TAKE ONE TABLET BY MOUTH DAILY FOR CONSTIPATION ON DAYS YOU TAKE YOUR PAIN MEDICATION. (Patient not taking: Reported on 04/19/2020) 30 tablet 3  . traZODone (DESYREL) 100 MG tablet Take 1 tablet (100 mg total) by mouth at bedtime. 60 tablet 2   No current facility-administered medications for this visit.     Musculoskeletal: Strength & Muscle Tone: decreased Gait & Station: unsteady  Patient leans: N/A  Psychiatric Specialty Exam: Review of Systems  Musculoskeletal: Positive for back pain.  All other systems reviewed and are negative.   There were no vitals taken for this visit.There is no height or weight on file to calculate BMI.  General Appearance: NA  Eye Contact:  NA  Speech:  Clear and Coherent  Volume:  Normal  Mood:  Euthymic  Affect:  NA  Thought Process:  Goal Directed  Orientation:  Full (Time, Place, and Person)  Thought Content: WDL   Suicidal Thoughts:  No  Homicidal Thoughts:  No  Memory:  Immediate;   Good Recent;   Fair Remote;   Fair  Judgement:  Fair  Insight:  Shallow  Psychomotor Activity:  Decreased  Concentration:  Concentration: Fair and Attention Span: Fair  Recall:  [employer] of Knowledge: Fair  Language: Good  Akathisia:  No  Handed:  Right  AIMS (if indicated): not done  Assets:  Communication Skills Desire for Improvement Resilience Social Support Talents/Skills  ADL's:  Intact  Cognition: WNL  Sleep:  Good   Screenings:   Assessment and Plan: This patient is a 69 year old female with a history of depression anxiety chronic pain and a remote history of alcohol abuse. Her niece is done a great job in  taking care of her making sure her medications are accurate and that she is med compliant getting enough nutrition and sleep. The patient is doing much better. For now she will continue Prozac 20 mg twice daily for depression, Xanax 0.5 mg twice daily 1 mg at bedtime for anxiety, Depakote 350 mg for mood stabilization and trazodone 100 mg at bedtime for sleep. She will return to see me in 3 months   [name], MD 04/29/2020, 1:23 PM

## 2020-05-03 DIAGNOSIS — K219 Gastro-esophageal reflux disease without esophagitis: Secondary | ICD-10-CM | POA: Diagnosis not present

## 2020-05-03 DIAGNOSIS — I10 Essential (primary) hypertension: Secondary | ICD-10-CM | POA: Diagnosis not present

## 2020-05-03 DIAGNOSIS — F17200 Nicotine dependence, unspecified, uncomplicated: Secondary | ICD-10-CM | POA: Diagnosis not present

## 2020-05-03 DIAGNOSIS — E063 Autoimmune thyroiditis: Secondary | ICD-10-CM | POA: Diagnosis not present

## 2020-05-03 DIAGNOSIS — G8929 Other chronic pain: Secondary | ICD-10-CM | POA: Diagnosis not present

## 2020-05-03 DIAGNOSIS — R2689 Other abnormalities of gait and mobility: Secondary | ICD-10-CM | POA: Diagnosis not present

## 2020-05-03 DIAGNOSIS — R5383 Other fatigue: Secondary | ICD-10-CM | POA: Diagnosis not present

## 2020-05-03 DIAGNOSIS — Z96651 Presence of right artificial knee joint: Secondary | ICD-10-CM | POA: Diagnosis not present

## 2020-05-04 ENCOUNTER — Other Ambulatory Visit: Payer: Self-pay

## 2020-05-04 ENCOUNTER — Other Ambulatory Visit: Payer: Self-pay | Admitting: Orthopedic Surgery

## 2020-05-04 DIAGNOSIS — S8002XA Contusion of left knee, initial encounter: Secondary | ICD-10-CM

## 2020-05-04 DIAGNOSIS — M1711 Unilateral primary osteoarthritis, right knee: Secondary | ICD-10-CM

## 2020-05-04 DIAGNOSIS — G894 Chronic pain syndrome: Secondary | ICD-10-CM

## 2020-05-04 DIAGNOSIS — M25562 Pain in left knee: Secondary | ICD-10-CM

## 2020-05-04 MED ORDER — OXYCODONE-ACETAMINOPHEN 7.5-325 MG PO TABS: ORAL_TABLET | ORAL | 0 refills | Status: DC

## 2020-05-11 ENCOUNTER — Other Ambulatory Visit: Payer: Self-pay

## 2020-05-11 DIAGNOSIS — G894 Chronic pain syndrome: Secondary | ICD-10-CM

## 2020-05-11 MED ORDER — OXYCODONE-ACETAMINOPHEN 7.5-325 MG PO TABS: ORAL_TABLET | ORAL | 0 refills | Status: DC

## 2020-05-14 ENCOUNTER — Encounter: Payer: Self-pay | Admitting: Gastroenterology

## 2020-05-14 ENCOUNTER — Other Ambulatory Visit: Payer: Self-pay

## 2020-05-14 ENCOUNTER — Ambulatory Visit (INDEPENDENT_AMBULATORY_CARE_PROVIDER_SITE_OTHER): Payer: Medicare Other | Admitting: Gastroenterology

## 2020-05-14 VITALS — BP 142/75 | HR 70 | Temp 97.0°F | Ht 63.0 in | Wt 198.0 lb

## 2020-05-14 DIAGNOSIS — R1319 Other dysphagia: Secondary | ICD-10-CM

## 2020-05-14 DIAGNOSIS — D649 Anemia, unspecified: Secondary | ICD-10-CM

## 2020-05-14 DIAGNOSIS — K219 Gastro-esophageal reflux disease without esophagitis: Secondary | ICD-10-CM | POA: Diagnosis not present

## 2020-05-14 DIAGNOSIS — K5900 Constipation, unspecified: Secondary | ICD-10-CM | POA: Diagnosis not present

## 2020-05-14 DIAGNOSIS — R1310 Dysphagia, unspecified: Secondary | ICD-10-CM | POA: Diagnosis not present

## 2020-05-14 MED ORDER — SYMPROIC 0.2 MG PO TABS: 0.2000 mg | ORAL_TABLET | Freq: Every day | ORAL | 2 refills | Status: DC

## 2020-05-14 NOTE — Patient Instructions (Addendum)
1. Continue Amitiza 24 mcg twice daily with food. 2. Add back Symproic 0.2 mg daily for constipation. 3. Continue pantoprazole 40 mg twice daily. 4. Call if you want to schedule x-ray of your esophagus to evaluate your swallowing. 5. Looked at your labs from 02/2020, you continue to have some anemia occurring for past several months. Please go to the lab for follow up anemia labs. 6. Office visit in 3 months, will plan on colonoscopy at that time.

## 2020-05-14 NOTE — Progress Notes (Signed)
Primary Care Physician: Elfredia Nevins, MD  Primary Gastroenterologist:  Roetta Sessions, MD   Chief Complaint  Patient presents with  . Gastroesophageal Reflux  . Constipation    HPI: Diane [name] is a 69 y.o. female here for follow-up.  Last seen in March.  History of GERD, constipation, dysphagia, colonic adenomas due for colonoscopy this month.  Also with history of weight loss, evaluated with CT in January 2021 that showed a 5 cm benign-appearing cyst in the left adnexa recommend a 1 year follow-up ultrasound.  She is being followed by GYN.  Chest x-ray was unremarkable for weight loss.  She had a low TSH, high free T4 back in April, we referred her back to her PCP for management.  Fasting cortisol level was normal.  She was admitted in June with syncope unclear etiology, presumed UTI, prolonged QT interval.  Weight stable last 3 months.  Having problems with constipation.  May go up to 2 weeks at a time without a bowel movement.  Sometimes only skips a couple of days and may even have stool couple days in a row.  Stools often hard.  No melena or rectal bleeding.  At some point she came off Symproic.  States that the pharmacy did not have any and needed to order it but she never heard back.  She was on it when she was in the hospital back in June.  She has been requiring more more pain medication.  I last saw her she was not taking it daily but now she is.  Amitiza 24 mcg twice daily not controlling her constipation.  Did better when she was on Symproic.  Denies heartburn.  She has some dysphagia to bread, meat, soda, some heels.  Sometimes food comes back up.  Happening intermittently but not that often.   Neurology work-up pending for dizziness.   Current Outpatient Medications  Medication Sig Dispense Refill  . ALPRAZolam (XANAX) 0.5 MG tablet TAKE ONE TABLET BY MOUTH TWICE DAILY AND TWO TABLETS AT BEDTIME. 120 tablet 2  . atorvastatin (LIPITOR) 20 MG tablet Take 20 mg by mouth  daily.    . cyclobenzaprine (FLEXERIL) 10 MG tablet TAKE ONE TABLET BY MOUTH TWICE DAILY AS NEEDED FOR MUSCLE SPASMS. 60 tablet 5  . divalproex (DEPAKOTE) 250 MG DR tablet .take one and one half at bedtime 45 tablet 2  . FLUoxetine (PROZAC) 20 MG capsule Take 1 capsule (20 mg total) by mouth 2 (two) times daily. 60 capsule 2  . hydrocortisone (ANUSOL-HC) 2.5 % rectal cream Place 1 application rectally 2 (two) times daily. (Patient taking differently: Place 1 application rectally as needed. ) 30 g 1  . levothyroxine (SYNTHROID) 125 MCG tablet Take 125 mcg by mouth daily.    Marland Kitchen lisinopril (PRINIVIL,ZESTRIL) 10 MG tablet Take 10 mg by mouth at bedtime.     Marland Kitchen lubiprostone (AMITIZA) 24 MCG capsule TAKE ONE CAPSULE BY MOUTH TWICE DAILY. TAKE WITH A MEAL. 60 capsule 5  . meloxicam (MOBIC) 7.5 MG tablet TAKE ONE TABLET BY MOUTH EVERY DAY 60 tablet 2  . oxyCODONE-acetaminophen (PERCOCET) 7.5-325 MG tablet One q 4 hrs prn pain 42 tablet 0  . pantoprazole (PROTONIX) 40 MG tablet TAKE ONE TABLET BY MOUTH TWICE DAILY. 60 tablet 5  . traZODone (DESYREL) 100 MG tablet Take 1 tablet (100 mg total) by mouth at bedtime. 60 tablet 2   No current facility-administered medications for this visit.    Allergies as of 05/14/2020 -  Review Complete 05/14/2020  Allergen Reaction Noted  . Neomycin-bacitracin zn-polymyx Other (See Comments) 02/27/2008  . Penicillins Hives   . Sulfonamide derivatives Hives and Swelling 02/27/2008    ROS:  General: Negative for anorexia, weight loss, fever, chills, fatigue, weakness. +dizzy ENT: Negative for hoarseness, difficulty swallowing , nasal congestion. CV: Negative for chest pain, angina, palpitations, dyspnea on exertion, peripheral edema.  Respiratory: Negative for dyspnea at rest, dyspnea on exertion, cough, sputum, wheezing.  GI: See history of present illness. GU:  Negative for dysuria, hematuria, urinary incontinence, urinary frequency, nocturnal urination.  Endo:  Negative for unusual weight change.    Physical Examination:   BP (!) 142/75   Pulse 70   Temp (!) 97 F (36.1 C) (Temporal)   Ht 5\' 3"  (1.6 m)   Wt 198 lb (89.8 kg)   BMI 35.07 kg/m   General: Well-nourished, well-developed in no acute distress.  Eyes: No icterus. Mouth: masked Lungs: Clear to auscultation bilaterally.  Heart: Regular rate and rhythm, no murmurs rubs or gallops.  Abdomen: Bowel sounds are normal, nondistended, no hepatosplenomegaly or masses, no abdominal bruits or hernia , no rebound or guarding.  Mild diffuse tenderness Extremities: No lower extremity edema. No clubbing or deformities. Neuro: Alert and oriented x 4   Skin: Warm and dry, no jaundice.   Psych: Alert and cooperative, normal mood and affect.  Labs:  Lab Results  Component Value Date   CREATININE 1.02 (H) 02/19/2020   BUN 14 02/19/2020   NA 140 02/19/2020   K 3.8 02/19/2020   CL 104 02/19/2020   CO2 28 02/19/2020   Lab Results  Component Value Date   ALT 11 02/18/2020   AST 14 (L) 02/18/2020   ALKPHOS 45 02/18/2020   BILITOT 0.7 02/18/2020   Lab Results  Component Value Date   WBC 7.2 02/19/2020   HGB 10.9 (L) 02/19/2020   HCT 34.8 (L) 02/19/2020   MCV 89.7 02/19/2020   PLT 173 02/19/2020   Lab Results  Component Value Date   IRON 116 12/29/2019   TIBC 252 12/29/2019   FERRITIN 280 12/29/2019   No results found for: VITAMINB12 No results found for: FOLATE  Lab Results  Component Value Date   TSH 7.105 (H) 02/18/2020     Imaging Studies: No results found.  Impression/plan:  Pleasant 69 year old female presenting for follow-up of GERD, constipation, dysphagia, GERD for surveillance colonoscopy for history of colon adenomas.  GERD: Well-controlled on pantoprazole 40 mg twice daily.  Constipation: Poorly managed.  No longer on Symproic.  New prescription sent to pharmacy.  She will take 1 daily on days she takes her opioids which is most likely every day.  Continue  Amitiza 24 mcg twice daily given difficult to manage constipation.  Call if symptoms do not improve.  Dysphagia: Offered barium pill esophagram given normal esophagus status post dilation in April 2020.  At this time she does not feel her symptoms are very frequent and would like to postpone work-up.  Will call if she needs to schedule air esophagram.  History of colonic adenomas, due for surveillance colonoscopy at this time.  Patient is currently undergoing evaluation for dizziness, neurology work-up pending.  She request to postpone procedure for now.  Return to the office in 3 months to schedule colonoscopy.  Normocytic anemia: Occurring over the past several months.  No evidence of iron deficiency in April 2021.  Update labs at this time.

## 2020-05-18 ENCOUNTER — Other Ambulatory Visit: Payer: Self-pay

## 2020-05-18 DIAGNOSIS — G894 Chronic pain syndrome: Secondary | ICD-10-CM

## 2020-05-19 MED ORDER — OXYCODONE-ACETAMINOPHEN 7.5-325 MG PO TABS: ORAL_TABLET | ORAL | 0 refills | Status: DC

## 2020-05-20 ENCOUNTER — Telehealth: Payer: Self-pay

## 2020-05-20 DIAGNOSIS — D649 Anemia, unspecified: Secondary | ICD-10-CM | POA: Diagnosis not present

## 2020-05-20 NOTE — Telephone Encounter (Signed)
PA for Symproic 0.2 mg has been completed through [email or web address]. waiting on an approval or denial.

## 2020-05-21 DIAGNOSIS — R5383 Other fatigue: Secondary | ICD-10-CM | POA: Diagnosis not present

## 2020-05-21 DIAGNOSIS — R2689 Other abnormalities of gait and mobility: Secondary | ICD-10-CM | POA: Diagnosis not present

## 2020-05-21 DIAGNOSIS — I10 Essential (primary) hypertension: Secondary | ICD-10-CM | POA: Diagnosis not present

## 2020-05-21 LAB — CBC WITH DIFFERENTIAL/PLATELET
Basophils Absolute: 0 10*3/uL (ref 0.0–0.2)
Basos: 0 %
EOS (ABSOLUTE): 0 10*3/uL (ref 0.0–0.4)
Eos: 1 %
Hematocrit: 36.4 % (ref 34.0–46.6)
Hemoglobin: 11.7 g/dL (ref 11.1–15.9)
Immature Grans (Abs): 0 10*3/uL (ref 0.0–0.1)
Immature Granulocytes: 0 %
Lymphocytes Absolute: 1.7 10*3/uL (ref 0.7–3.1)
Lymphs: 31 %
MCH: 29.2 pg (ref 26.6–33.0)
MCHC: 32.1 g/dL (ref 31.5–35.7)
MCV: 91 fL (ref 79–97)
Monocytes Absolute: 0.5 10*3/uL (ref 0.1–0.9)
Monocytes: 9 %
Neutrophils Absolute: 3.3 10*3/uL (ref 1.4–7.0)
Neutrophils: 59 %
Platelets: 220 10*3/uL (ref 150–450)
RBC: 4.01 x10E6/uL (ref 3.77–5.28)
RDW: 12.6 % (ref 11.7–15.4)
WBC: 5.6 10*3/uL (ref 3.4–10.8)

## 2020-05-21 LAB — IRON,TIBC AND FERRITIN PANEL
Ferritin: 341 ng/mL — ABNORMAL HIGH (ref 15–150)
Iron Saturation: 33 % (ref 15–55)
Iron: 82 ug/dL (ref 27–139)
Total Iron Binding Capacity: 247 ug/dL — ABNORMAL LOW (ref 250–450)
UIBC: 165 ug/dL (ref 118–369)

## 2020-05-25 ENCOUNTER — Other Ambulatory Visit: Payer: Self-pay | Admitting: Orthopedic Surgery

## 2020-05-25 ENCOUNTER — Telehealth: Payer: Self-pay | Admitting: Internal Medicine

## 2020-05-25 DIAGNOSIS — I10 Essential (primary) hypertension: Secondary | ICD-10-CM | POA: Diagnosis not present

## 2020-05-25 DIAGNOSIS — G894 Chronic pain syndrome: Secondary | ICD-10-CM

## 2020-05-25 DIAGNOSIS — Z96651 Presence of right artificial knee joint: Secondary | ICD-10-CM | POA: Diagnosis not present

## 2020-05-25 DIAGNOSIS — F17200 Nicotine dependence, unspecified, uncomplicated: Secondary | ICD-10-CM | POA: Diagnosis not present

## 2020-05-25 DIAGNOSIS — G8929 Other chronic pain: Secondary | ICD-10-CM | POA: Diagnosis not present

## 2020-05-25 DIAGNOSIS — R2689 Other abnormalities of gait and mobility: Secondary | ICD-10-CM | POA: Diagnosis not present

## 2020-05-25 DIAGNOSIS — E063 Autoimmune thyroiditis: Secondary | ICD-10-CM | POA: Diagnosis not present

## 2020-05-25 DIAGNOSIS — K219 Gastro-esophageal reflux disease without esophagitis: Secondary | ICD-10-CM | POA: Diagnosis not present

## 2020-05-25 DIAGNOSIS — R5383 Other fatigue: Secondary | ICD-10-CM | POA: Diagnosis not present

## 2020-05-25 NOTE — Telephone Encounter (Signed)
Oxycodone/Acetaminophen 7.5-325  mgs.  Qty  42      Sig: One q 4 hrs prn pain   Patient states she uses [facility] in [place]

## 2020-05-25 NOTE — Telephone Encounter (Signed)
Last medication that was sent in is not covered by her insurance, can something else be sent in or can her insurance be called for an authorization

## 2020-05-26 MED ORDER — OXYCODONE-ACETAMINOPHEN 7.5-325 MG PO TABS: ORAL_TABLET | ORAL | 0 refills | Status: DC

## 2020-05-28 NOTE — Telephone Encounter (Signed)
FYI Left a detailed message for pt. Pt has tried Amitiza, Linzess, and Symproic. Symprotic isn't covered under insurance and PA was denied. Will discuss with pts insurance.

## 2020-06-01 ENCOUNTER — Other Ambulatory Visit: Payer: Self-pay

## 2020-06-01 DIAGNOSIS — G894 Chronic pain syndrome: Secondary | ICD-10-CM

## 2020-06-01 MED ORDER — OXYCODONE-ACETAMINOPHEN 7.5-325 MG PO TABS: ORAL_TABLET | ORAL | 0 refills | Status: DC

## 2020-06-04 NOTE — Telephone Encounter (Signed)
PA was denied for Symproic. Called pts insurance company and they are faxing a form over. Medication can be approved per the denial letter, if non cancer pain is listed. Will submit form when received.

## 2020-06-07 ENCOUNTER — Telehealth: Payer: Self-pay | Admitting: Internal Medicine

## 2020-06-07 ENCOUNTER — Encounter: Payer: Self-pay | Admitting: Neurology

## 2020-06-07 ENCOUNTER — Ambulatory Visit (INDEPENDENT_AMBULATORY_CARE_PROVIDER_SITE_OTHER): Payer: Medicare Other | Admitting: Neurology

## 2020-06-07 VITALS — BP 108/60 | HR 77 | Ht 63.0 in | Wt 197.3 lb

## 2020-06-07 DIAGNOSIS — R251 Tremor, unspecified: Secondary | ICD-10-CM

## 2020-06-07 DIAGNOSIS — F439 Reaction to severe stress, unspecified: Secondary | ICD-10-CM | POA: Diagnosis not present

## 2020-06-07 DIAGNOSIS — F418 Other specified anxiety disorders: Secondary | ICD-10-CM | POA: Diagnosis not present

## 2020-06-07 DIAGNOSIS — R2689 Other abnormalities of gait and mobility: Secondary | ICD-10-CM

## 2020-06-07 NOTE — Progress Notes (Signed)
Subjective:    Patient ID: Diane [name] is a 69 y.o. female.  HPI     Huston Foley, MD, PhD [facility] [address] [address] [place], [place] [address]  Dear Dr. Sherwood Gambler,   I saw your patient, Diane [name], upon your kind request, in my Neurologic clinic today for initial consultation of her tremor and gait disorder.  The patient is accompanied by her niece today. Patient lives with her niece. As you know, Ms. Wollam is a 30 year old right-handed woman with an underlying medical history of hypothyroidism, arthritis, sleep apnea, lumbar radiculopathy, status post back surgery, history of seizures, reflux disease, hypertension, chronic back pain, anxiety, allergic rhinitis, mood disorder with bipolar disorder per chart review, history of syncope, Status post right total knee replacement, status post foot surgeries, and obesity, who reports a several year history of hand tremors.  It has become worse in the past few months.  She has had gait instability.  She has fallen.  She has no family history of Parkinson's disease.  She is 1 of a total of 11 siblings.  She does report recent stress, she lost her brother a month ago and her sister is not doing well, she has cancer.  Another brother also has cancer.  She reports feeling dizzy when she stands up and when she tries to walk.  Sometimes it feels like the room is spinning but sometimes she is just not able to really describe how she feels, not necessarily lightheaded.  She has had intermittent headaches.  She has not been sleeping very well.  She had recent sleep studies and is waiting to get her CPAP machine as I understand.  She had a head CT without contrast and cervical spine CT without contrast on 02/18/2020 and I reviewed the results: IMPRESSION: 1. Generalized cerebral atrophy. 2. No acute intracranial abnormality.  IMPRESSION: 1. Unremarkable cervical spine.  No acute fracture.   She subsequently had a brain  MRI without contrast on 02/18/2020 through the emergency room for weakness and fall, I reviewed the results: IMPRESSION: No acute intracranial abnormality. Normal for age noncontrast MRI appearance of the brain.  Of note, she is on multiple medications including potentially sedating medications.  She is followed by behavioral health.  She takes narcotic pain medication, Percocet generic, 2 to 5 pills/day on average.  She has been on Depakote, currently 250 mg at bedtime.  She is on Prozac 20 mg twice daily, she takes Xanax 0.5 mg strength 2 pills at bedtime and up to 1 pill twice daily as needed during the day.  She takes trazodone milligrams strength, 1 pill at night although it was not one-point written for 2 pills.  She is on cyclobenzaprine, which is written for twice daily as needed but takes 1 at bedtime as needed.  She has been on Depakote for years.   Her Past Medical History Is Significant For: Past Medical History:  Diagnosis Date  . Allergic rhinitis   . Anxiety   . Arthritis   . Cataracts, bilateral   . Chronic back pain    Sees pain mamagement clinic  . Chronic pain syndrome   . Constipation   . Depression   . Essential hypertension   . GERD (gastroesophageal reflux disease)   . Headaches, cluster   . History of seizures   . Hypothyroid   . Mania (HCC)   . Mixed hyperlipidemia   . PTSD (post-traumatic stress disorder)   . Right lumbar  radiculopathy   . Sleep apnea    STOP BANG score=4  . 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[facility])     Her Past Surgical History Is Significant For: Past Surgical History:  Procedure Laterality Date  . BACK SURGERY    . BILATERAL OOPHORECTOMY    . CATARACT EXTRACTION W/PHACO Left 01/04/2016   Procedure: CATARACT EXTRACTION PHACO AND INTRAOCULAR LENS PLACEMENT (IOC);  Surgeon: Jethro Bolus, MD;  Location: [facility];  Service: Ophthalmology;  Laterality: Left;  CDE:4.70  . CATARACT EXTRACTION W/PHACO Right 01/18/2016   Procedure: CATARACT EXTRACTION  PHACO AND INTRAOCULAR LENS PLACEMENT RIGHT EYE; CDE:  4.66;  Surgeon: Jethro Bolus, MD;  Location: [facility];  Service: Ophthalmology;  Laterality: Right;  . CHOLECYSTECTOMY    . COLONOSCOPY  10/16/2008   KTG:[id number], otherwise normal rectum; pancolonic diverticula the remainder of the colonic mucosa appeared normal. Next TCS due 10/2013.  Marland Kitchen COLONOSCOPY  06/28/2006   RMR: Internal hemorrhoids. Diminutive rectal polyps, cold biopsied/ Pancolonic diverticula. Polyps in the right colon removed   . COLONOSCOPY WITH PROPOFOL N/A 05/20/2015   RMR: Pancolonic diverticulosis. Redundant colon   . ESOPHAGOGASTRODUODENOSCOPY  10/16/2008   AJG:OTLXB hiatal hernia otherwise normal esophagus, stomach  D1, D2, status post passage of a 56-French Maloney dilator  . ESOPHAGOGASTRODUODENOSCOPY  06/28/2006   WIO:MBTDHR esophagus. Small hiatal hernia. Otherwise normal stomach, D1 and D2, status post passage of a 56 [place] Maloney dilator  . ESOPHAGOGASTRODUODENOSCOPY (EGD) WITH ESOPHAGEAL DILATION N/A 07/14/2013   RMR: Abnormal esophagus of uncertain significance-status post esophageal biospy. small hiatal hernia.   Marland Kitchen ESOPHAGOGASTRODUODENOSCOPY (EGD) WITH PROPOFOL N/A 05/20/2015   RMR: NORMAL EGD status post maloney dilation   . ESOPHAGOGASTRODUODENOSCOPY (EGD) WITH PROPOFOL N/A 12/23/2018   Dr. Jena Gauss: normal esophagus, s/p esophageal dilation.   Marland Kitchen FOOT SURGERY     left foot-  . INCISION AND DRAINAGE Right 01/03/2013   Procedure: INCISION AND DRAINAGE;  Surgeon: Vickki Hearing, MD;  Location: [facility];  Service: Orthopedics;  Laterality: Right;  . KNEE ARTHROSCOPY  01/2012   right  . KNEE ARTHROSCOPY WITH LATERAL RELEASE Right 01/03/2013   Procedure: Lateral Release Patella Right Knee;  Surgeon: Vickki Hearing, MD;  Location: [facility];  Service: Orthopedics;  Laterality: Right;  . LAPAROSCOPIC LYSIS OF ADHESIONS N/A 11/12/2019   Procedure: LAPAROSCOPIC LYSIS OF ADHESIONS, extensive; drainage of peritoneal cyst;   Surgeon: Lazaro Arms, MD;  Location: [facility];  Service: Gynecology;  Laterality: N/A;  . LUMBAR DISC SURGERY  L4-5  . MALONEY DILATION N/A 05/20/2015   Procedure: Elease Hashimoto DILATION;  Surgeon: Corbin Ade, MD;  Location: [facility];  Service: Endoscopy;  Laterality: N/A;  Maloney dilator # 54  . MALONEY DILATION N/A 12/23/2018   Procedure: Elease Hashimoto DILATION;  Surgeon: Corbin Ade, MD;  [facility]: [facility];  Service: Endoscopy;  Laterality: N/A;  . PARTIAL HYSTERECTOMY    . TOTAL KNEE ARTHROPLASTY  07/01/2012   Procedure: TOTAL KNEE ARTHROPLASTY;  Surgeon: Vickki Hearing, MD;  Location: [facility];  Service: Orthopedics;  Laterality: Right;  . TOTAL KNEE REVISION Right 01/03/2013   Procedure: Patellaplasty Right Knee;  Surgeon: Vickki Hearing, MD;  Location: [facility];  Service: Orthopedics;  Laterality: Right;    Her Family History Is Significant For: Family History  Problem Relation Age of Onset  . Alcohol abuse Mother   . Anxiety disorder Mother   . Alcohol abuse Father   . Anxiety disorder Father   . Bipolar disorder Sister   .  Alcohol abuse Brother   . Anxiety disorder Brother   . Drug abuse Brother   . Dementia Paternal Aunt   . ADD / ADHD Grandchild   . ADD / ADHD Grandchild   . Drug abuse Brother   . Alcohol abuse Brother   . Anxiety disorder Brother   . Seizures Brother   . Alcohol abuse Brother   . Anxiety disorder Brother   . Seizures Brother   . Alcohol abuse Brother   . Anxiety disorder Brother   . Alcohol abuse Brother   . Anxiety disorder Brother   . Heart disease Other   . Diabetes Other   . Alcohol abuse Other   . Depression Neg Hx   . OCD Neg Hx   . Paranoid behavior Neg Hx   . Schizophrenia Neg Hx   . Sexual abuse Neg Hx   . Physical abuse Neg Hx   . Colon cancer Neg Hx     Her Social History Is Significant For: Social History   Socioeconomic History  . Marital status: Legally Separated    Spouse name: Not on file  . Number of children: Not  on file  . Years of education: 12th grade  . Highest education level: Not on file  Occupational History  . Occupation: disabled    Associate Professor: UNEMPLOYED  Tobacco Use  . Smoking status: Former Smoker    Packs/day: 0.25    Years: 5.00    Pack years: 1.25    Types: Cigarettes    Quit date: 06/26/2003    Years since quitting: 16.9  . Smokeless tobacco: Never Used  Vaping Use  . Vaping Use: Never used  Substance and Sexual Activity  . Alcohol use: Yes    Alcohol/week: 0.0 standard drinks    Comment: occasionally; once a month.  . Drug use: Not Currently    Comment: has a past history of street drug use  . Sexual activity: Yes    Birth control/protection: Surgical  Other Topics Concern  . Not on file  Social History Narrative  . Not on file   Social Determinants of Health   Financial Resource Strain:   . Difficulty of Paying Living Expenses: Not on file  Food Insecurity:   . Worried About Programme researcher, broadcasting/film/video in the Last Year: Not on file  . Ran Out of Food in the Last Year: Not on file  Transportation Needs:   . Lack of Transportation (Medical): Not on file  . Lack of Transportation (Non-Medical): Not on file  Physical Activity:   . Days of Exercise per Week: Not on file  . Minutes of Exercise per Session: Not on file  Stress:   . Feeling of Stress : Not on file  Social Connections:   . Frequency of Communication with Friends and Family: Not on file  . Frequency of Social Gatherings with Friends and Family: Not on file  . Attends Religious Services: Not on file  . Active Member of Clubs or Organizations: Not on file  . Attends Banker Meetings: Not on file  . Marital Status: Not on file    Her Allergies Are:  Allergies  Allergen Reactions  . Neomycin-Bacitracin Zn-Polymyx Other (See Comments)    Reaction: skin starts to become raw and peels off  . Penicillins Hives  . Sulfonamide Derivatives Hives and Swelling  :   Her Current Medications Are:   Outpatient Encounter Medications as of 06/07/2020  Medication Sig  . ALPRAZolam (XANAX) 0.5  MG tablet TAKE ONE TABLET BY MOUTH TWICE DAILY AND TWO TABLETS AT BEDTIME.  Marland Kitchen atorvastatin (LIPITOR) 20 MG tablet Take 20 mg by mouth daily.  . cyclobenzaprine (FLEXERIL) 10 MG tablet TAKE ONE TABLET BY MOUTH TWICE DAILY AS NEEDED FOR MUSCLE SPASMS.  Marland Kitchen divalproex (DEPAKOTE) 250 MG DR tablet .take one and one half at bedtime  . FLUoxetine (PROZAC) 20 MG capsule Take 1 capsule (20 mg total) by mouth 2 (two) times daily.  . hydrocortisone (ANUSOL-HC) 2.5 % rectal cream Place 1 application rectally 2 (two) times daily. (Patient taking differently: Place 1 application rectally as needed. )  . levothyroxine (SYNTHROID) 125 MCG tablet Take 125 mcg by mouth daily.  Marland Kitchen lisinopril (PRINIVIL,ZESTRIL) 10 MG tablet Take 10 mg by mouth at bedtime.   Marland Kitchen lubiprostone (AMITIZA) 24 MCG capsule TAKE ONE CAPSULE BY MOUTH TWICE DAILY. TAKE WITH A MEAL.  . meloxicam (MOBIC) 7.5 MG tablet TAKE ONE TABLET BY MOUTH EVERY DAY  . Naldemedine Tosylate (SYMPROIC) 0.2 MG TABS Take 0.2 mg by mouth daily.  Marland Kitchen oxyCODONE-acetaminophen (PERCOCET) 7.5-325 MG tablet One q 4 hrs prn pain  . pantoprazole (PROTONIX) 40 MG tablet TAKE ONE TABLET BY MOUTH TWICE DAILY.  . traZODone (DESYREL) 100 MG tablet Take 1 tablet (100 mg total) by mouth at bedtime.   No facility-administered encounter medications on file as of 06/07/2020.  :   Review of Systems:  Out of a complete 14 point review of systems, all are reviewed and negative with the exception of these symptoms as listed below:  Review of Systems  Neurological:       Here for consult on worsening tremors.  Pt repots sx have been present for several years not but increased as of late. She also reports increased dizziness and a pounding at the base of her head.    Objective:  Neurological Exam  Physical Exam Physical Examination:   Vitals:   06/07/20 1440  BP: 108/60  Pulse: 77   SpO2: 94%    General Examination: The patient is a very pleasant 69 y.o. female in no acute distress. She appears well-developed and well-nourished and well groomed.  She is quiet.  History is primarily provided by her niece.    HEENT: Normocephalic, atraumatic, pupils are equal, round and reactive to light and accommodation. Extraocular tracking is good without limitation to gaze excursion or nystagmus noted. Normal smooth pursuit is noted. Hearing is grossly intact. Face is symmetric with normal facial animation and normal facial sensation. Speech is clear with no dysarthria noted. There is no hypophonia, but she is somewhat soft-spoken.  She has no lip, neck or jaw tremor.  Neck is supple, no carotid bruits.  Airway examination reveals mild mouth dryness.  Tongue protrudes centrally and palate elevates symmetrically, no orofacial or lingual dyskinesias.  Chest: Clear to auscultation without wheezing, rhonchi or crackles noted.  Heart: S1+S2+0, regular and normal without murmurs, rubs or gallops noted.   Abdomen: Soft, non-tender and non-distended with normal bowel sounds appreciated on auscultation.  Extremities: There is trace pitting edema in the distal lower extremities bilaterally.   Skin: Warm and dry without trophic changes noted.  Musculoskeletal: exam reveals right knee swelling.  Also discomfort in the left knee.   Neurologically:  Mental status: The patient is awake, alert and oriented in all 4 spheres. Her speech is scant, she gives minimal history, she answers questions in few word sentences only.  No dysarthria.  Mood is constricted and affect is blunted.  Cranial nerves II - XII are as described above under HEENT exam. In addition: shoulder shrug is normal with equal shoulder height noted. Motor exam: Normal bulk, strength and tone is noted. There is no resting tremor, no drift.   On 06/07/2020: Archimedes spiral drawing she has no significant trembling with either hand,  insecurity with the left more than right hand.  Handwriting is legible, not tremulous, not micrographic.  She has a minimal to mild postural tremor in both upper extremities, mild action tremor, slightly worse on the left.  No intention tremor.  She has no lower extremity tremor.  Reflexes are 1+ in the upper and lower extremities.  Fine motor skills and coordination: Finger-to-nose is slow and deliberate, heel-to-shin is difficult with the right leg because of the swelling, no dysmetria.  Heel-to-shin is slightly better on the left, no dysmetria.    Cerebellar testing: No dysmetria or intention tremor on finger to nose testing. There is no truncal or gait ataxia.  Sensory exam: intact to light touch in the upper and lower extremities.  Gait, station and balance: She stands slowly and has to push herself up.  She stands wider base.  She did not bring a cane or walker today.  She walks slowly and cautiously and has a limp on the right.  She turns slowly.  No shuffling, she has preserved arm swing.   Assessment and Plan:   Assessment and Plan:  In summary, Risha Domenick GongM Dinan is a very pleasant 69 y.o.-year old female with an underlying medical history of hypothyroidism, arthritis, sleep apnea, lumbar radiculopathy, status post back surgery, history of seizures, reflux disease, hypertension, chronic back pain, anxiety, allergic rhinitis, mood disorder with bipolar disorder per chart review, history of syncope, Status post right total knee replacement, status post foot surgeries, and obesity, who presents for evaluation of her tremor and gait disorder.  Examination is not telltale for a single unifying diagnosis.  I think her tremor as well as her gait disturbance are multifactorial in nature.  She does not have any evidence of cerebellar dysfunction, no parkinsonism, history and exam not telltale for essential tremor.  Tremor may be due to underlying suboptimally treated anxiety, increase in stress, certain  medications including the Depakote and Prozac.  Similarly, her gait disturbance appears to be a multifactorial issue secondary to arthritis, knee pain bilaterally, significant swelling of the right knee, arthritis of the lower back, certain medications that can affect the balance including narcotic pain medication, and her psychotropic and neurotropic medications.  Unfortunately, there is not a whole lot I can offer her at this time, we talked about the importance of fall prevention.  She is advised to use her walker at all times for gait safety.  She has undergone physical therapy.  She had recent imaging tests which did not show any acute findings.  She is reminded to stay well rested and well-hydrated.  She is advised to talk to you about getting her CPAP machine.  Unfortunately, there is currently a shortage of new CPAP machines as you know.  She is advised to follow-up with me on an as-needed basis.  I did not suggest any new testing from my end of things.  I answered all their questions today and the patient and her niece were in agreement.  Sincerely,   Huston FoleySaima Arhum Peeples, MD, PhD

## 2020-06-07 NOTE — Telephone Encounter (Signed)
[phone] patient returned call about medications, please call back

## 2020-06-07 NOTE — Patient Instructions (Signed)
I believe you have a multifactorial gait disorder, meaning, that it is Diane [name] is due to a combination of factors. These factors include: normal aging, degenerative arthritis of your back, arthritis affecting both knees, having fallen, medication side effect as you are taking multiple medications that can cause dizziness and sleepiness.    Similarly, I think your tremor is also due to multiple causes including increase in anxiety and recent increase in stress, certain medications can also exacerbate tremors including your Prozac and Depakote.  I would not recommend adding any new medication for tremors as medications for tremors can cause sleepiness and dizziness.  I did not see any signs of parkinsonism or Parkinson's-like changes thankfully.  You had a recent brain MRI and CT scans as well of your head and neck.  Please remember to stand up slowly and get your bearings first turn slowly, no bending down to pick anything, no heavy lifting, be extra careful at night and first thing in the morning. Also, be careful in the Bathroom and the kitchen.   Remember to drink plenty of fluid, eat healthy meals and do not skip any meals. Try to eat protein with a every meal and eat a healthy snack such as fruit or nuts or yogurt in between meals. Try to keep a regular sleep-wake schedule.  Please use your walker at all times for gait safety.    Please talk to Dr. Sherwood Gambler about getting your CPAP machine, as I understand you have been diagnosed with sleep apnea.    As far as your medications are concerned, I would like to suggest no new medications.  Unfortunately, there is not a whole lot of suggestions I have for you.  You have also had home health physical therapy.  I do not think you need any additional testing from my end of things.  I would be happy to see you back as needed.

## 2020-06-08 ENCOUNTER — Other Ambulatory Visit: Payer: Self-pay

## 2020-06-08 DIAGNOSIS — G894 Chronic pain syndrome: Secondary | ICD-10-CM

## 2020-06-08 DIAGNOSIS — D509 Iron deficiency anemia, unspecified: Secondary | ICD-10-CM

## 2020-06-09 MED ORDER — OXYCODONE-ACETAMINOPHEN 7.5-325 MG PO TABS: ORAL_TABLET | ORAL | 0 refills | Status: DC

## 2020-06-09 NOTE — Telephone Encounter (Signed)
Spoke with pts niece. Waiting on pts insurance to fax new forms for appeal. Will contact pt once I hear back from her insurance company.

## 2020-06-14 ENCOUNTER — Encounter: Payer: Self-pay | Admitting: *Deleted

## 2020-06-14 ENCOUNTER — Other Ambulatory Visit: Payer: Self-pay | Admitting: *Deleted

## 2020-06-14 DIAGNOSIS — D509 Iron deficiency anemia, unspecified: Secondary | ICD-10-CM

## 2020-06-15 ENCOUNTER — Other Ambulatory Visit: Payer: Self-pay

## 2020-06-15 DIAGNOSIS — G894 Chronic pain syndrome: Secondary | ICD-10-CM

## 2020-06-15 MED ORDER — OXYCODONE-ACETAMINOPHEN 7.5-325 MG PO TABS: ORAL_TABLET | ORAL | 0 refills | Status: DC

## 2020-06-17 DIAGNOSIS — Z23 Encounter for immunization: Secondary | ICD-10-CM | POA: Diagnosis not present

## 2020-06-17 DIAGNOSIS — N342 Other urethritis: Secondary | ICD-10-CM | POA: Diagnosis not present

## 2020-06-22 ENCOUNTER — Other Ambulatory Visit: Payer: Self-pay

## 2020-06-22 DIAGNOSIS — G894 Chronic pain syndrome: Secondary | ICD-10-CM

## 2020-06-22 MED ORDER — OXYCODONE-ACETAMINOPHEN 7.5-325 MG PO TABS: ORAL_TABLET | ORAL | 0 refills | Status: DC

## 2020-06-24 DIAGNOSIS — J019 Acute sinusitis, unspecified: Secondary | ICD-10-CM | POA: Diagnosis not present

## 2020-06-29 ENCOUNTER — Other Ambulatory Visit: Payer: Self-pay

## 2020-06-29 DIAGNOSIS — G894 Chronic pain syndrome: Secondary | ICD-10-CM

## 2020-06-29 MED ORDER — OXYCODONE-ACETAMINOPHEN 7.5-325 MG PO TABS: ORAL_TABLET | ORAL | 0 refills | Status: DC

## 2020-06-30 ENCOUNTER — Other Ambulatory Visit: Payer: Self-pay | Admitting: Gastroenterology

## 2020-06-30 DIAGNOSIS — D509 Iron deficiency anemia, unspecified: Secondary | ICD-10-CM | POA: Diagnosis not present

## 2020-06-30 NOTE — Telephone Encounter (Signed)
Pt's pain isn't related to cancer or previous cancer treatments.

## 2020-06-30 NOTE — Telephone Encounter (Signed)
Patient has chronic non-cancer pain

## 2020-06-30 NOTE — Telephone Encounter (Signed)
Received a letter from [employer]. Symproic can only be approved if pt has chroninc non-cancer pain or chronic pain related to prior cancer or it's treatments. Samples of Symproic 0.2 mg have been provided for pt and are ready for pickup.

## 2020-07-01 LAB — CBC WITH DIFFERENTIAL/PLATELET
Absolute Monocytes: 525 cells/uL (ref 200–950)
Basophils Absolute: 26 cells/uL (ref 0–200)
Basophils Relative: 0.3 %
Eosinophils Absolute: 9 cells/uL — ABNORMAL LOW (ref 15–500)
Eosinophils Relative: 0.1 %
HCT: 36.2 % (ref 35.0–45.0)
Hemoglobin: 11.7 g/dL (ref 11.7–15.5)
Lymphs Abs: 3861 cells/uL (ref 850–3900)
MCH: 28.4 pg (ref 27.0–33.0)
MCHC: 32.3 g/dL (ref 32.0–36.0)
MCV: 87.9 fL (ref 80.0–100.0)
MPV: 11.7 fL (ref 7.5–12.5)
Monocytes Relative: 6.1 %
Neutro Abs: 4180 cells/uL (ref 1500–7800)
Neutrophils Relative %: 48.6 %
Platelets: 235 10*3/uL (ref 140–400)
RBC: 4.12 10*6/uL (ref 3.80–5.10)
RDW: 12.7 % (ref 11.0–15.0)
Total Lymphocyte: 44.9 %
WBC: 8.6 10*3/uL (ref 3.8–10.8)

## 2020-07-01 LAB — IRON,TIBC AND FERRITIN PANEL
%SAT: 43 % (calc) (ref 16–45)
Ferritin: 238 ng/mL (ref 16–288)
Iron: 114 ug/dL (ref 45–160)
TIBC: 268 mcg/dL (calc) (ref 250–450)

## 2020-07-05 ENCOUNTER — Other Ambulatory Visit: Payer: Self-pay | Admitting: Gastroenterology

## 2020-07-05 ENCOUNTER — Other Ambulatory Visit (HOSPITAL_COMMUNITY): Payer: Self-pay | Admitting: Psychiatry

## 2020-07-05 DIAGNOSIS — F5105 Insomnia due to other mental disorder: Secondary | ICD-10-CM

## 2020-07-05 DIAGNOSIS — K219 Gastro-esophageal reflux disease without esophagitis: Secondary | ICD-10-CM

## 2020-07-06 ENCOUNTER — Other Ambulatory Visit: Payer: Self-pay

## 2020-07-06 DIAGNOSIS — G894 Chronic pain syndrome: Secondary | ICD-10-CM

## 2020-07-07 MED ORDER — OXYCODONE-ACETAMINOPHEN 7.5-325 MG PO TABS: ORAL_TABLET | ORAL | 0 refills | Status: DC

## 2020-07-08 NOTE — Telephone Encounter (Signed)
Helmut Muster: was she able to get her symproic approved. She has non cancer chronic pain and below you noted that this is acceptable reason for them to approve symproic. That is the FDA indication for this med.

## 2020-07-08 NOTE — Telephone Encounter (Signed)
An appeal has been submitted. Waiting on an approval or denial. Pt was provided samples last week.

## 2020-07-12 NOTE — Telephone Encounter (Signed)
noted 

## 2020-07-13 ENCOUNTER — Other Ambulatory Visit: Payer: Self-pay

## 2020-07-13 DIAGNOSIS — G894 Chronic pain syndrome: Secondary | ICD-10-CM

## 2020-07-14 MED ORDER — OXYCODONE-ACETAMINOPHEN 7.5-325 MG PO TABS: ORAL_TABLET | ORAL | 0 refills | Status: DC

## 2020-07-19 NOTE — Telephone Encounter (Signed)
Called [employer] at [phone], to inquire about the appeal results. Symproic 0.2 mg has been approved through 09/10/2021. Spoke with pts niece. She is aware of the approval.

## 2020-07-20 ENCOUNTER — Other Ambulatory Visit: Payer: Self-pay

## 2020-07-20 DIAGNOSIS — G894 Chronic pain syndrome: Secondary | ICD-10-CM

## 2020-07-20 MED ORDER — OXYCODONE-ACETAMINOPHEN 7.5-325 MG PO TABS: ORAL_TABLET | ORAL | 0 refills | Status: DC

## 2020-07-27 ENCOUNTER — Other Ambulatory Visit: Payer: Self-pay

## 2020-07-27 DIAGNOSIS — G894 Chronic pain syndrome: Secondary | ICD-10-CM

## 2020-07-27 MED ORDER — OXYCODONE-ACETAMINOPHEN 7.5-325 MG PO TABS: ORAL_TABLET | ORAL | 0 refills | Status: DC

## 2020-07-28 ENCOUNTER — Ambulatory Visit: Payer: Medicare Other | Admitting: Orthopedic Surgery

## 2020-07-29 ENCOUNTER — Ambulatory Visit: Payer: Medicare Other | Admitting: Orthopedic Surgery

## 2020-07-29 ENCOUNTER — Other Ambulatory Visit (HOSPITAL_COMMUNITY): Payer: Self-pay | Admitting: Psychiatry

## 2020-07-29 ENCOUNTER — Other Ambulatory Visit: Payer: Self-pay

## 2020-07-29 ENCOUNTER — Telehealth (INDEPENDENT_AMBULATORY_CARE_PROVIDER_SITE_OTHER): Payer: Medicare Other | Admitting: Psychiatry

## 2020-07-29 ENCOUNTER — Encounter (HOSPITAL_COMMUNITY): Payer: Self-pay | Admitting: Psychiatry

## 2020-07-29 DIAGNOSIS — F5105 Insomnia due to other mental disorder: Secondary | ICD-10-CM

## 2020-07-29 DIAGNOSIS — F3130 Bipolar disorder, current episode depressed, mild or moderate severity, unspecified: Secondary | ICD-10-CM | POA: Diagnosis not present

## 2020-07-29 MED ORDER — TRAZODONE HCL 100 MG PO TABS
100.0000 mg | ORAL_TABLET | Freq: Every day | ORAL | 2 refills | Status: DC
Start: 2020-07-29 — End: 2020-10-28

## 2020-07-29 MED ORDER — DIVALPROEX SODIUM 250 MG PO DR TAB: DELAYED_RELEASE_TABLET | ORAL | 2 refills | Status: DC

## 2020-07-29 MED ORDER — FLUOXETINE HCL 20 MG PO CAPS
20.0000 mg | ORAL_CAPSULE | Freq: Two times a day (BID) | ORAL | 2 refills | Status: DC
Start: 2020-07-29 — End: 2020-10-28

## 2020-07-29 MED ORDER — ALPRAZOLAM 0.5 MG PO TABS: ORAL_TABLET | ORAL | 2 refills | Status: DC

## 2020-07-29 NOTE — Progress Notes (Signed)
Virtual Visit via Telephone Note  I connected with Diane [name] on 07/29/20 at  3:20 PM EST by telephone and verified that I am speaking with the correct person using two identifiers.  Location: Patient: home Provider: office   I discussed the limitations, risks, security and privacy concerns of performing an evaluation and management service by telephone and the availability of in person appointments. I also discussed with the patient that there may be a patient responsible charge related to this service. The patient expressed understanding and agreed to proceed.    I discussed the assessment and treatment plan with the patient. The patient was provided an opportunity to ask questions and all were answered. The patient agreed with the plan and demonstrated an understanding of the instructions.   The patient was advised to call back or seek an in-person evaluation if the symptoms worsen or if the condition fails to improve as anticipated.  I provided 15 minutes of non-face-to-face time during this encounter.   Diane Ruder, MD  [facility] MD/PA/NP OP Progress Note  07/29/2020 3:37 PM [name] [name]  MRN:  [id number]  Chief Complaint:  Chief Complaint    Depression; Anxiety; Follow-up     HPI: This patient is a 69 year old black female who lives with her niece in [place]. She is on disability.  The patient returns for follow-up after 3 months. I spoke with both her and her niece. The patient states that she is doing well. She is still getting out and driving at times. She saw neurology a couple of months ago for her hand tremor but the physician felt that it was multifactorial and had to deal with stress medications and aging. She does not seem to be letting it bother her. He also suggested she walk with her walker but she does not do this all the time according to her niece.  The patient states that her mood has been fairly good. This is despite the fact that 2 of her siblings recently died  from cancer. She is sleeping well most of the time and her energy is good. She is eating well. She denies anxiety depression or suicidal ideation Visit Diagnosis:    ICD-10-CM   1. Bipolar I disorder, most recent episode depressed (HCC)  F31.30 divalproex (DEPAKOTE) 250 MG DR tablet  2. 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 At that time she was using drugs and alcohol. She has been in outpatient care ever since  Past Medical History:  Past Medical History:  Diagnosis Date  . Allergic rhinitis   . Anxiety   . Arthritis   . Cataracts, bilateral   . Chronic back pain    Sees pain mamagement clinic  . Chronic pain syndrome   . Constipation   . Depression   . Essential hypertension   . GERD (gastroesophageal reflux disease)   . Headaches, cluster   . History of seizures   . Hypothyroid   . Mania (HCC)   . Mixed hyperlipidemia   . PTSD (post-traumatic stress disorder)   . Right lumbar radiculopathy   . Sleep apnea    STOP BANG score=4  . 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[facility])     Past Surgical History:  Procedure Laterality Date  . BACK SURGERY    . BILATERAL OOPHORECTOMY    . CATARACT EXTRACTION W/PHACO Left  01/04/2016   Procedure: CATARACT EXTRACTION PHACO AND INTRAOCULAR LENS PLACEMENT (IOC);  Surgeon: Jethro BolusMark Shapiro, MD;  Location: [facility];  Service: Ophthalmology;  Laterality: Left;  CDE:4.70  . CATARACT EXTRACTION W/PHACO Right 01/18/2016   Procedure: CATARACT EXTRACTION PHACO AND INTRAOCULAR LENS PLACEMENT RIGHT EYE; CDE:  4.66;  Surgeon: Jethro BolusMark Shapiro, MD;  Location: [facility];  Service: Ophthalmology;  Laterality: Right;  . CHOLECYSTECTOMY    . COLONOSCOPY  10/16/2008   ZOX:[id number]:Hemorrhoids, otherwise normal rectum; pancolonic diverticula the remainder of the colonic mucosa appeared normal. Next TCS due 10/2013.  Marland Kitchen.  COLONOSCOPY  06/28/2006   RMR: Internal hemorrhoids. Diminutive rectal polyps, cold biopsied/ Pancolonic diverticula. Polyps in the right colon removed   . COLONOSCOPY WITH PROPOFOL N/A 05/20/2015   RMR: Pancolonic diverticulosis. Redundant colon   . ESOPHAGOGASTRODUODENOSCOPY  10/16/2008   XBJ:[id number]:Small hiatal hernia otherwise normal esophagus, stomach  D1, D2, status post passage of a 56-French Maloney dilator  . ESOPHAGOGASTRODUODENOSCOPY  06/28/2006   FAO:[id number]:Normal esophagus. Small hiatal hernia. Otherwise normal stomach, D1 and D2, status post passage of a 56 [place] Maloney dilator  . ESOPHAGOGASTRODUODENOSCOPY (EGD) WITH ESOPHAGEAL DILATION N/A 07/14/2013   RMR: Abnormal esophagus of uncertain significance-status post esophageal biospy. small hiatal hernia.   Marland Kitchen. ESOPHAGOGASTRODUODENOSCOPY (EGD) WITH PROPOFOL N/A 05/20/2015   RMR: NORMAL EGD status post maloney dilation   . ESOPHAGOGASTRODUODENOSCOPY (EGD) WITH PROPOFOL N/A 12/23/2018   Dr. Jena Gaussrourk: normal esophagus, s/p esophageal dilation.   Marland Kitchen. FOOT SURGERY     left foot-  . INCISION AND DRAINAGE Right 01/03/2013   Procedure: INCISION AND DRAINAGE;  Surgeon: Vickki HearingStanley E Harrison, MD;  Location: [facility];  Service: Orthopedics;  Laterality: Right;  . KNEE ARTHROSCOPY  01/2012   right  . KNEE ARTHROSCOPY WITH LATERAL RELEASE Right 01/03/2013   Procedure: Lateral Release Patella Right Knee;  Surgeon: Vickki HearingStanley E Harrison, MD;  Location: [facility];  Service: Orthopedics;  Laterality: Right;  . LAPAROSCOPIC LYSIS OF ADHESIONS N/A 11/12/2019   Procedure: LAPAROSCOPIC LYSIS OF ADHESIONS, extensive; drainage of peritoneal cyst;  Surgeon: Lazaro ArmsEure, Luther H, MD;  Location: [facility];  Service: Gynecology;  Laterality: N/A;  . LUMBAR DISC SURGERY  L4-5  . MALONEY DILATION N/A 05/20/2015   Procedure: Elease HashimotoMALONEY DILATION;  Surgeon: Corbin Adeobert M Rourk, MD;  Location: [facility];  Service: Endoscopy;  Laterality: N/A;  Maloney dilator # 54  . MALONEY DILATION N/A 12/23/2018   Procedure: Elease HashimotoMALONEY  DILATION;  Surgeon: Corbin Adeourk, Robert M, MD;  Location: [facility];  Service: Endoscopy;  Laterality: N/A;  . PARTIAL HYSTERECTOMY    . TOTAL KNEE ARTHROPLASTY  07/01/2012   Procedure: TOTAL KNEE ARTHROPLASTY;  Surgeon: Vickki HearingStanley E Harrison, MD;  Location: [facility];  Service: Orthopedics;  Laterality: Right;  . TOTAL KNEE REVISION Right 01/03/2013   Procedure: Patellaplasty Right Knee;  Surgeon: Vickki HearingStanley E Harrison, MD;  Location: [facility];  Service: Orthopedics;  Laterality: Right;    Family Psychiatric History: see below  Family History:  Family History  Problem Relation Age of Onset  . Alcohol abuse Mother   . Anxiety disorder Mother   . Alcohol abuse Father   . Anxiety disorder Father   . Bipolar disorder Sister   . Alcohol abuse Brother   . Anxiety disorder Brother   . Drug abuse Brother   . Dementia Paternal Aunt   . ADD / ADHD Grandchild   . ADD / ADHD Grandchild   . Drug abuse Brother   . Alcohol abuse Brother   . Anxiety  disorder Brother   . Seizures Brother   . Alcohol abuse Brother   . Anxiety disorder Brother   . Seizures Brother   . Alcohol abuse Brother   . Anxiety disorder Brother   . Alcohol abuse Brother   . Anxiety disorder Brother   . Heart disease Other   . Diabetes Other   . Alcohol abuse Other   . Depression Neg Hx   . OCD Neg Hx   . Paranoid behavior Neg Hx   . Schizophrenia Neg Hx   . Sexual abuse Neg Hx   . Physical abuse Neg Hx   . Colon cancer Neg Hx     Social History:  Social History   Socioeconomic History  . Marital status: Legally Separated    Spouse name: Not on file  . Number of children: Not on file  . Years of education: 12th grade  . Highest education level: Not on file  Occupational History  . Occupation: disabled    Associate Professor: UNEMPLOYED  Tobacco Use  . Smoking status: Former Smoker    Packs/day: 0.25    Years: 5.00    Pack years: 1.25    Types: Cigarettes    Quit date: 06/26/2003    Years since quitting: 17.1  .  Smokeless tobacco: Never Used  Vaping Use  . Vaping Use: Never used  Substance and Sexual Activity  . Alcohol use: Yes    Alcohol/week: 0.0 standard drinks    Comment: occasionally; once a month.  . Drug use: Not Currently    Comment: has a past history of street drug use  . Sexual activity: Yes    Birth control/protection: Surgical  Other Topics Concern  . Not on file  Social History Narrative  . Not on file   Social Determinants of Health   Financial Resource Strain:   . Difficulty of Paying Living Expenses: Not on file  Food Insecurity:   . Worried About Programme researcher, broadcasting/film/video in the Last Year: Not on file  . Ran Out of Food in the Last Year: Not on file  Transportation Needs:   . Lack of Transportation (Medical): Not on file  . Lack of Transportation (Non-Medical): Not on file  Physical Activity:   . Days of Exercise per Week: Not on file  . Minutes of Exercise per Session: Not on file  Stress:   . Feeling of Stress : Not on file  Social Connections:   . Frequency of Communication with Friends and Family: Not on file  . Frequency of Social Gatherings with Friends and Family: Not on file  . Attends Religious Services: Not on file  . Active Member of Clubs or Organizations: Not on file  . Attends Banker Meetings: Not on file  . Marital Status: Not on file    Allergies:  Allergies  Allergen Reactions  . Neomycin-Bacitracin Zn-Polymyx Other (See Comments)    Reaction: skin starts to become raw and peels off  . Penicillins Hives  . Sulfonamide Derivatives Hives and Swelling    Metabolic Disorder Labs: Lab Results  Component Value Date   HGBA1C 5.5 02/19/2020   MPG 111.15 02/19/2020   MPG 120 (H) 01/25/2012   No results found for: PROLACTIN Lab Results  Component Value Date   CHOL  12/10/2010    69        ATP III CLASSIFICATION:  <200     mg/dL   Desirable  161-096  mg/dL   Borderline High  >=045  mg/dL   High          TRIG 38 12/10/2010    HDL 28 (L) 12/10/2010   CHOLHDL 2.5 12/10/2010   VLDL 8 12/10/2010   LDLCALC  12/10/2010    33        Total Cholesterol/HDL:CHD Risk Coronary Heart Disease Risk Table                     Men   Women  1/2 Average Risk   3.4   3.3  Average Risk       5.0   4.4  2 X Average Risk   9.6   7.1  3 X Average Risk  23.4   11.0        Use the calculated Patient Ratio above and the CHD Risk Table to determine the patient's CHD Risk.        ATP III CLASSIFICATION (LDL):  <100     mg/dL   Optimal  539-767  mg/dL   Near or Above                    Optimal  130-159  mg/dL   Borderline  341-937  mg/dL   High  >902     mg/dL   Very High   LDLCALC 71 07/12/2009   Lab Results  Component Value Date   TSH 7.105 (H) 02/18/2020   TSH 0.52 02/11/2018    Therapeutic Level Labs: No results found for: LITHIUM Lab Results  Component Value Date   VALPROATE 70.7 09/24/2018   VALPROATE 71.5 01/30/2017   No components found for:  CBMZ  Current Medications: Current Outpatient Medications  Medication Sig Dispense Refill  . ALPRAZolam (XANAX) 0.5 MG tablet TAKE ONE TABLET BY MOUTH TWICE DAILY AND TWO TABLETS AT BEDTIME. 120 tablet 2  . atorvastatin (LIPITOR) 20 MG tablet Take 20 mg by mouth daily.    . cyclobenzaprine (FLEXERIL) 10 MG tablet TAKE ONE TABLET BY MOUTH TWICE DAILY AS NEEDED FOR MUSCLE SPASMS. 60 tablet 5  . divalproex (DEPAKOTE) 250 MG DR tablet .take one and one half at bedtime 45 tablet 2  . FLUoxetine (PROZAC) 20 MG capsule Take 1 capsule (20 mg total) by mouth 2 (two) times daily. 60 capsule 2  . hydrocortisone (ANUSOL-HC) 2.5 % rectal cream Place 1 application rectally 2 (two) times daily. (Patient taking differently: Place 1 application rectally as needed. ) 30 g 1  . levothyroxine (SYNTHROID) 125 MCG tablet Take 125 mcg by mouth daily.    Marland Kitchen lisinopril (PRINIVIL,ZESTRIL) 10 MG tablet Take 10 mg by mouth at bedtime.     Marland Kitchen lubiprostone (AMITIZA) 24 MCG capsule TAKE ONE CAPSULE BY  MOUTH TWICE DAILY. TAKE WITH A MEAL. 60 capsule 5  . meloxicam (MOBIC) 7.5 MG tablet TAKE ONE TABLET BY MOUTH EVERY DAY 60 tablet 2  . Naldemedine Tosylate (SYMPROIC) 0.2 MG TABS Take 0.2 mg by mouth daily. 30 tablet 2  . oxyCODONE-acetaminophen (PERCOCET) 7.5-325 MG tablet One q 4 hrs prn pain 42 tablet 0  . pantoprazole (PROTONIX) 40 MG tablet TAKE ONE TABLET BY MOUTH TWICE DAILY 60 tablet 5  . traZODone (DESYREL) 100 MG tablet Take 1 tablet (100 mg total) by mouth at bedtime. 30 tablet 2   No current facility-administered medications for this visit.     Musculoskeletal: Strength & Muscle Tone: decreased Gait & Station: unsteady Patient leans: N/A  Psychiatric Specialty Exam: Review of Systems  Musculoskeletal: Positive for  back pain and gait problem.  Neurological: Positive for tremors.  All other systems reviewed and are negative.   There were no vitals taken for this visit.There is no height or weight on file to calculate BMI.  General Appearance: NA  Eye Contact:  NA  Speech:  Clear and Coherent  Volume:  Normal  Mood:  Euthymic  Affect:  NA  Thought Process:  Goal Directed  Orientation:  Full (Time, Place, and Person)  Thought Content: Rumination   Suicidal Thoughts:  No  Homicidal Thoughts:  No  Memory:  Immediate;   Good Recent;   Fair Remote;   NA  Judgement:  Fair  Insight:  Fair  Psychomotor Activity:  Decreased  Concentration:  Concentration: Fair and Attention Span: Fair  Recall:  [employer] of Knowledge: Fair  Language: Good  Akathisia:  No  Handed:  Right  AIMS (if indicated): not done  Assets:  Communication Skills Desire for Improvement Resilience Social Support Talents/Skills  ADL's:  Intact  Cognition: WNL  Sleep:  Good   Screenings:   Assessment and Plan: This patient is a 70 year old female with a history of depression anxiety chronic pain and a remote history of alcohol abuse. The patient is doing better now that her niece is living  with her. Her mood seems to be stable so she will continue Prozac 20 mg twice daily for depression, Xanax 0.5 mg twice daily and 1 mg at bedtime for anxiety, Depakote 375 mg for mood stabilization and trazodone 100 mg at bedtime for sleep. She will return to see me in 3 months   Diane Ruder, MD 07/29/2020, 3:37 PM

## 2020-08-03 ENCOUNTER — Other Ambulatory Visit: Payer: Self-pay

## 2020-08-03 DIAGNOSIS — G894 Chronic pain syndrome: Secondary | ICD-10-CM

## 2020-08-03 MED ORDER — OXYCODONE-ACETAMINOPHEN 7.5-325 MG PO TABS: ORAL_TABLET | ORAL | 0 refills | Status: DC

## 2020-08-04 ENCOUNTER — Other Ambulatory Visit: Payer: Self-pay | Admitting: Gastroenterology

## 2020-08-09 ENCOUNTER — Other Ambulatory Visit: Payer: Self-pay

## 2020-08-09 ENCOUNTER — Ambulatory Visit: Payer: Medicare Other

## 2020-08-09 ENCOUNTER — Ambulatory Visit (INDEPENDENT_AMBULATORY_CARE_PROVIDER_SITE_OTHER): Payer: Medicare Other | Admitting: Orthopedic Surgery

## 2020-08-09 ENCOUNTER — Encounter: Payer: Self-pay | Admitting: Orthopedic Surgery

## 2020-08-09 VITALS — BP 134/63 | HR 91 | Ht 63.0 in | Wt 194.0 lb

## 2020-08-09 DIAGNOSIS — Z96651 Presence of right artificial knee joint: Secondary | ICD-10-CM

## 2020-08-09 DIAGNOSIS — M1711 Unilateral primary osteoarthritis, right knee: Secondary | ICD-10-CM

## 2020-08-09 DIAGNOSIS — G894 Chronic pain syndrome: Secondary | ICD-10-CM | POA: Diagnosis not present

## 2020-08-09 NOTE — Progress Notes (Signed)
  Chief Complaint  Patient presents with  . Routine Post Op    right knee replaced 07/01/12   8-year follow-up on her right knee which is persistently been painful  She worked up for infection was clear she did have a patellar revision it did not help still complains of anterolateral knee pain  She has an excellent range of motion she feels stable her x-rays look good but she has chronic pain is being treated for that as well  BP 134/63   Pulse 91   Ht 5\' 3"  (1.6 m)   Wt 194 lb (88 kg)   BMI 34.37 kg/m    Encounter Diagnoses  Name Primary?  . 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 Arthritis of knee, right    I will see her in a year

## 2020-08-10 ENCOUNTER — Other Ambulatory Visit: Payer: Self-pay

## 2020-08-10 DIAGNOSIS — G894 Chronic pain syndrome: Secondary | ICD-10-CM

## 2020-08-11 MED ORDER — OXYCODONE-ACETAMINOPHEN 7.5-325 MG PO TABS: ORAL_TABLET | ORAL | 0 refills | Status: DC

## 2020-08-17 ENCOUNTER — Other Ambulatory Visit: Payer: Self-pay

## 2020-08-17 DIAGNOSIS — G894 Chronic pain syndrome: Secondary | ICD-10-CM

## 2020-08-17 MED ORDER — OXYCODONE-ACETAMINOPHEN 7.5-325 MG PO TABS: ORAL_TABLET | ORAL | 0 refills | Status: DC

## 2020-08-24 ENCOUNTER — Other Ambulatory Visit: Payer: Self-pay

## 2020-08-24 ENCOUNTER — Encounter: Payer: Self-pay | Admitting: Gastroenterology

## 2020-08-24 ENCOUNTER — Ambulatory Visit (INDEPENDENT_AMBULATORY_CARE_PROVIDER_SITE_OTHER): Payer: Medicare Other | Admitting: Gastroenterology

## 2020-08-24 ENCOUNTER — Encounter: Payer: Self-pay | Admitting: *Deleted

## 2020-08-24 VITALS — BP 125/67 | HR 74 | Temp 96.7°F | Ht 63.0 in | Wt 192.8 lb

## 2020-08-24 DIAGNOSIS — Z86010 Personal history of colonic polyps: Secondary | ICD-10-CM

## 2020-08-24 DIAGNOSIS — K5900 Constipation, unspecified: Secondary | ICD-10-CM

## 2020-08-24 DIAGNOSIS — G894 Chronic pain syndrome: Secondary | ICD-10-CM

## 2020-08-24 DIAGNOSIS — K219 Gastro-esophageal reflux disease without esophagitis: Secondary | ICD-10-CM

## 2020-08-24 MED ORDER — CLENPIQ 10-3.5-12 MG-GM -GM/160ML PO SOLN: 1.0000 | Freq: Once | ORAL | 0 refills | Status: AC

## 2020-08-24 NOTE — Progress Notes (Signed)
Primary Care Physician: Elfredia NevinsFusco, Lawrence, MD  Primary Gastroenterologist:  Roetta SessionsMichael Rourk, MD   Chief Complaint  Patient presents with  . Gastroesophageal Reflux    HPI: Diane [name] is a 69 y.o. female here for follow-up.  Last seen in September.  History of GERD, constipation, dysphagia, colonic adenomas, normocytic anemia.  Finally got Symproic approved.  Labs in October, hemoglobin now normal.  Iron studies normal.  She is due for colonoscopy for surveillance purposes.  Last colonoscopy in 2016, pancolonic diverticulosis, redundant colon.  BM twice per day now that she is back on Symproic. Continues Amitiza as well. No melena, brbpr. Prior to BM, bad abdominal cramping. Stools are soft. Happening almost every time has a BM. Symptoms prior to getting back on Symproic. Pain goes away with BM. No n/v. No heartburn on pantoprazole BID.  Weight overall stable since July.    Current Outpatient Medications  Medication Sig Dispense Refill  . ALPRAZolam (XANAX) 0.5 MG tablet TAKE ONE TABLET BY MOUTH TWICE DAILY AND TWO TABLETS AT BEDTIME. 120 tablet 2  . atorvastatin (LIPITOR) 20 MG tablet Take 20 mg by mouth daily.    . cyclobenzaprine (FLEXERIL) 10 MG tablet TAKE ONE TABLET BY MOUTH TWICE DAILY AS NEEDED FOR MUSCLE SPASMS. 60 tablet 5  . divalproex (DEPAKOTE) 250 MG DR tablet .take one and one half at bedtime 45 tablet 2  . FLUoxetine (PROZAC) 20 MG capsule Take 1 capsule (20 mg total) by mouth 2 (two) times daily. 60 capsule 2  . hydrocortisone (ANUSOL-HC) 2.5 % rectal cream Place 1 application rectally 2 (two) times daily. (Patient taking differently: Place 1 application rectally as needed.) 30 g 1  . levothyroxine (SYNTHROID) 125 MCG tablet Take 125 mcg by mouth daily.    Marland Kitchen. lisinopril (PRINIVIL,ZESTRIL) 10 MG tablet Take 10 mg by mouth at bedtime.     Marland Kitchen. lubiprostone (AMITIZA) 24 MCG capsule TAKE ONE CAPSULE BY MOUTH TWICE DAILY. TAKE WITH A MEAL. 60 capsule 11  . meloxicam  (MOBIC) 7.5 MG tablet TAKE ONE TABLET BY MOUTH EVERY DAY 60 tablet 2  . Naldemedine Tosylate (SYMPROIC) 0.2 MG TABS Take 0.2 mg by mouth daily. 30 tablet 2  . oxyCODONE-acetaminophen (PERCOCET) 7.5-325 MG tablet One q 4 hrs prn pain 42 tablet 0  . pantoprazole (PROTONIX) 40 MG tablet TAKE ONE TABLET BY MOUTH TWICE DAILY 60 tablet 5  . traZODone (DESYREL) 100 MG tablet Take 1 tablet (100 mg total) by mouth at bedtime. 30 tablet 2   No current facility-administered medications for this visit.    Allergies as of 08/24/2020 - Review Complete 08/24/2020  Allergen Reaction Noted  . Neomycin-bacitracin zn-polymyx Other (See Comments) 02/27/2008  . Penicillins Hives   . Sulfonamide derivatives Hives and Swelling 02/27/2008   Past Medical History:  Diagnosis Date  . Allergic rhinitis   . Anxiety   . Arthritis   . Cataracts, bilateral   . Chronic back pain    Sees pain mamagement clinic  . Chronic pain syndrome   . Constipation   . Depression   . Essential hypertension   . GERD (gastroesophageal reflux disease)   . Headaches, cluster   . History of seizures   . Hypothyroid   . Mania (HCC)   . Mixed hyperlipidemia   . PTSD (post-traumatic stress disorder)   . Right lumbar radiculopathy   . Sleep apnea    STOP BANG score=4  . 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[facility](HCC)    Past  Surgical History:  Procedure Laterality Date  . BACK SURGERY    . BILATERAL OOPHORECTOMY    . CATARACT EXTRACTION W/PHACO Left 01/04/2016   Procedure: CATARACT EXTRACTION PHACO AND INTRAOCULAR LENS PLACEMENT (IOC);  Surgeon: Jethro Bolus, MD;  Location: [facility];  Service: Ophthalmology;  Laterality: Left;  CDE:4.70  . CATARACT EXTRACTION W/PHACO Right 01/18/2016   Procedure: CATARACT EXTRACTION PHACO AND INTRAOCULAR LENS PLACEMENT RIGHT EYE; CDE:  4.66;  Surgeon: Jethro Bolus, MD;  Location: [facility];  Service: Ophthalmology;  Laterality: Right;  . CHOLECYSTECTOMY    . COLONOSCOPY  10/16/2008   FUX:[id number], otherwise  normal rectum; pancolonic diverticula the remainder of the colonic mucosa appeared normal. Next TCS due 10/2013.  Marland Kitchen COLONOSCOPY  06/28/2006   RMR: Internal hemorrhoids. Diminutive rectal polyps, cold biopsied/ Pancolonic diverticula. Polyps in the right colon removed   . COLONOSCOPY WITH PROPOFOL N/A 05/20/2015   RMR: Pancolonic diverticulosis. Redundant colon   . ESOPHAGOGASTRODUODENOSCOPY  10/16/2008   RKY:HCWCB hiatal hernia otherwise normal esophagus, stomach  D1, D2, status post passage of a 56-French Maloney dilator  . ESOPHAGOGASTRODUODENOSCOPY  06/28/2006   JSE:GBTDVV esophagus. Small hiatal hernia. Otherwise normal stomach, D1 and D2, status post passage of a 56 [place] Maloney dilator  . ESOPHAGOGASTRODUODENOSCOPY (EGD) WITH ESOPHAGEAL DILATION N/A 07/14/2013   RMR: Abnormal esophagus of uncertain significance-status post esophageal biospy. small hiatal hernia.   Marland Kitchen ESOPHAGOGASTRODUODENOSCOPY (EGD) WITH PROPOFOL N/A 05/20/2015   RMR: NORMAL EGD status post maloney dilation   . ESOPHAGOGASTRODUODENOSCOPY (EGD) WITH PROPOFOL N/A 12/23/2018   Dr. Jena Gauss: normal esophagus, s/p esophageal dilation.   Marland Kitchen FOOT SURGERY     left foot-  . INCISION AND DRAINAGE Right 01/03/2013   Procedure: INCISION AND DRAINAGE;  Surgeon: Vickki Hearing, MD;  Location: [facility];  Service: Orthopedics;  Laterality: Right;  . KNEE ARTHROSCOPY  01/2012   right  . KNEE ARTHROSCOPY WITH LATERAL RELEASE Right 01/03/2013   Procedure: Lateral Release Patella Right Knee;  Surgeon: Vickki Hearing, MD;  Location: [facility];  Service: Orthopedics;  Laterality: Right;  . LAPAROSCOPIC LYSIS OF ADHESIONS N/A 11/12/2019   Procedure: LAPAROSCOPIC LYSIS OF ADHESIONS, extensive; drainage of peritoneal cyst;  Surgeon: Lazaro Arms, MD;  Location: [facility];  Service: Gynecology;  Laterality: N/A;  . LUMBAR DISC SURGERY  L4-5  . MALONEY DILATION N/A 05/20/2015   Procedure: Elease Hashimoto DILATION;  Surgeon: Corbin Ade, MD;  Location: [facility];   Service: Endoscopy;  Laterality: N/A;  Maloney dilator # 54  . MALONEY DILATION N/A 12/23/2018   Procedure: Elease Hashimoto DILATION;  Surgeon: Corbin Ade, MD;  Location: [facility];  Service: Endoscopy;  Laterality: N/A;  . PARTIAL HYSTERECTOMY    . TOTAL KNEE ARTHROPLASTY  07/01/2012   Procedure: TOTAL KNEE ARTHROPLASTY;  Surgeon: Vickki Hearing, MD;  Location: [facility];  Service: Orthopedics;  Laterality: Right;  . TOTAL KNEE REVISION Right 01/03/2013   Procedure: Patellaplasty Right Knee;  Surgeon: Vickki Hearing, MD;  Location: [facility];  Service: Orthopedics;  Laterality: Right;   Family History  Problem Relation Age of Onset  . Alcohol abuse Mother   . Anxiety disorder Mother   . Alcohol abuse Father   . Anxiety disorder Father   . Bipolar disorder Sister   . Alcohol abuse Brother   . Anxiety disorder Brother   . Drug abuse Brother   . Dementia Paternal Aunt   . ADD / ADHD Grandchild   . ADD / ADHD Grandchild   .  Drug abuse Brother   . Alcohol abuse Brother   . Anxiety disorder Brother   . Seizures Brother   . Alcohol abuse Brother   . Anxiety disorder Brother   . Seizures Brother   . Alcohol abuse Brother   . Anxiety disorder Brother   . Alcohol abuse Brother   . Anxiety disorder Brother   . Heart disease Other   . Diabetes Other   . Alcohol abuse Other   . Depression Neg Hx   . OCD Neg Hx   . Paranoid behavior Neg Hx   . Schizophrenia Neg Hx   . Sexual abuse Neg Hx   . Physical abuse Neg Hx   . Colon cancer Neg Hx    Social History   Tobacco Use  . Smoking status: Former Smoker    Packs/day: 0.25    Years: 5.00    Pack years: 1.25    Types: Cigarettes    Quit date: 06/26/2003    Years since quitting: 17.1  . Smokeless tobacco: Never Used  Vaping Use  . Vaping Use: Never used  Substance Use Topics  . Alcohol use: Yes    Alcohol/week: 0.0 standard drinks    Comment: occasionally; once a month.  . Drug use: Not Currently    Comment: has a past  history of street drug use    ROS:  General: Negative for anorexia, weight loss, fever, chills, fatigue, weakness. ENT: Negative for hoarseness, difficulty swallowing , nasal congestion. CV: Negative for chest pain, angina, palpitations, dyspnea on exertion, peripheral edema.  Respiratory: Negative for dyspnea at rest, dyspnea on exertion, cough, sputum, wheezing.  GI: See history of present illness. GU:  Negative for dysuria, hematuria, urinary incontinence, urinary frequency, nocturnal urination.  Endo: Negative for unusual weight change.    Physical Examination:   BP 125/67   Pulse 74   Temp (!) 96.7 F (35.9 C) (Temporal)   Ht 5\' 3"  (1.6 m)   Wt 192 lb 12.8 oz (87.5 kg)   BMI 34.15 kg/m   General: Well-nourished, well-developed in no acute distress.  Eyes: No icterus. Mouth: masked Lungs: Clear to auscultation bilaterally.  Heart: Regular rate and rhythm, no murmurs rubs or gallops.  Abdomen: Bowel sounds are normal, nontender, nondistended, no hepatosplenomegaly or masses, no abdominal bruits or hernia , no rebound or guarding.   Extremities: No lower extremity edema. No clubbing or deformities. Neuro: Alert and oriented x 4   Skin: Warm and dry, no jaundice.   Psych: Alert and cooperative, normal mood and affect.  Labs:  Lab Results  Component Value Date   WBC 8.6 06/30/2020   HGB 11.7 06/30/2020   HCT 36.2 06/30/2020   MCV 87.9 06/30/2020   PLT 235 06/30/2020   Lab Results  Component Value Date   IRON 114 06/30/2020   TIBC 268 06/30/2020   FERRITIN 238 06/30/2020     Imaging Studies: DG Knee AP/LAT W/Sunrise Right  Result Date: 08/09/2020 Images right knee 8 years after total knee with patellar revision Knee remains painful with chronic pain Alignment looks good no loosening Stable implant no loosening  Impression/plan:  Pleasant 69 year old female presenting for follow-up of GERD, constipation, dysphagia, due for surveillance colonoscopy for history  of colonic adenomas.  GERD: Well-controlled on pantoprazole 40 mg twice daily.  Constipation: Doing well at this time.  We were finally able to get Symproic approved for her.  She is back on that once daily along with Amitiza 24 mcg twice daily.  Having a bowel movement 1-2 times per day.  She complains of some abdominal cramping prior to BMs which has been going on for a while.  Stools are soft.  Abdominal cramping resolves after BM.  We will continue to monitor.  She had a CT abdomen pelvis with contrast back in January with no evidence of obstruction, she did have diverticulosis.  Colonoscopy as planned.  History of colonic adenomas: Due for surveillance.  Colonoscopy in the near future with Dr. Jena Gauss with propofol due to polypharmacy. ASA II.  I have discussed the risks, alternatives, benefits with regards to but not limited to the risk of reaction to medication, bleeding, infection, perforation and the patient is agreeable to proceed. Written consent to be obtained.  Normocytic anemia: Resolved.

## 2020-08-24 NOTE — Patient Instructions (Addendum)
1. For constipation, please continue Amitiza 24 mcg twice daily and Symproic 0.2 mg daily. 2. For reflux, continue pantoprazole 40 mg once to twice daily.  Try to get by with the lowest dose possible. 3. Colonoscopy as scheduled.  Please see separate instructions.

## 2020-08-24 NOTE — Progress Notes (Signed)
CC'ED TO PCP 

## 2020-08-25 MED ORDER — OXYCODONE-ACETAMINOPHEN 7.5-325 MG PO TABS: ORAL_TABLET | ORAL | 0 refills | Status: DC

## 2020-08-31 ENCOUNTER — Other Ambulatory Visit: Payer: Self-pay

## 2020-08-31 DIAGNOSIS — G894 Chronic pain syndrome: Secondary | ICD-10-CM

## 2020-09-01 MED ORDER — OXYCODONE-ACETAMINOPHEN 7.5-325 MG PO TABS: ORAL_TABLET | ORAL | 0 refills | Status: DC

## 2020-09-07 ENCOUNTER — Other Ambulatory Visit: Payer: Self-pay

## 2020-09-07 DIAGNOSIS — G894 Chronic pain syndrome: Secondary | ICD-10-CM

## 2020-09-08 MED ORDER — OXYCODONE-ACETAMINOPHEN 7.5-325 MG PO TABS: ORAL_TABLET | ORAL | 0 refills | Status: DC

## 2020-09-14 ENCOUNTER — Other Ambulatory Visit: Payer: Self-pay

## 2020-09-14 DIAGNOSIS — G894 Chronic pain syndrome: Secondary | ICD-10-CM

## 2020-09-15 MED ORDER — OXYCODONE-ACETAMINOPHEN 7.5-325 MG PO TABS: ORAL_TABLET | ORAL | 0 refills | Status: DC

## 2020-09-21 ENCOUNTER — Other Ambulatory Visit: Payer: Self-pay

## 2020-09-21 DIAGNOSIS — G894 Chronic pain syndrome: Secondary | ICD-10-CM

## 2020-09-22 ENCOUNTER — Other Ambulatory Visit (HOSPITAL_COMMUNITY)
Admission: RE | Admit: 2020-09-22 | Discharge: 2020-09-22 | Disposition: A | Discharge status: Home / Self Care | Payer: Medicare Other | Source: Ambulatory Visit | Attending: Internal Medicine | Admitting: Internal Medicine

## 2020-09-22 ENCOUNTER — Telehealth: Payer: Self-pay | Admitting: *Deleted

## 2020-09-22 MED ORDER — OXYCODONE-ACETAMINOPHEN 7.5-325 MG PO TABS: ORAL_TABLET | ORAL | 0 refills | Status: DC

## 2020-09-22 NOTE — Telephone Encounter (Signed)
Spoke with patient niece Delice Bison. She is sick and needs to r/s procedure. Patient has been rescheduled to 2/11 at 9:00am. Aware will mail new prep instruction with new pre-op/covid test. Endo aware.

## 2020-09-28 ENCOUNTER — Other Ambulatory Visit: Payer: Self-pay

## 2020-09-28 DIAGNOSIS — G894 Chronic pain syndrome: Secondary | ICD-10-CM

## 2020-09-28 MED ORDER — OXYCODONE-ACETAMINOPHEN 7.5-325 MG PO TABS: ORAL_TABLET | ORAL | 0 refills | Status: DC

## 2020-10-05 ENCOUNTER — Other Ambulatory Visit: Payer: Self-pay

## 2020-10-05 ENCOUNTER — Other Ambulatory Visit (HOSPITAL_COMMUNITY): Payer: Self-pay | Admitting: Psychiatry

## 2020-10-05 DIAGNOSIS — F5105 Insomnia due to other mental disorder: Secondary | ICD-10-CM

## 2020-10-05 DIAGNOSIS — F3130 Bipolar disorder, current episode depressed, mild or moderate severity, unspecified: Secondary | ICD-10-CM

## 2020-10-05 DIAGNOSIS — G894 Chronic pain syndrome: Secondary | ICD-10-CM

## 2020-10-06 MED ORDER — OXYCODONE-ACETAMINOPHEN 7.5-325 MG PO TABS
ORAL_TABLET | ORAL | 0 refills | Status: DC
Start: 2020-10-06 — End: 2020-10-12

## 2020-10-12 ENCOUNTER — Other Ambulatory Visit: Payer: Self-pay

## 2020-10-12 DIAGNOSIS — G894 Chronic pain syndrome: Secondary | ICD-10-CM

## 2020-10-13 MED ORDER — OXYCODONE-ACETAMINOPHEN 7.5-325 MG PO TABS: ORAL_TABLET | ORAL | 0 refills | Status: DC

## 2020-10-19 ENCOUNTER — Other Ambulatory Visit: Payer: Self-pay

## 2020-10-19 DIAGNOSIS — G894 Chronic pain syndrome: Secondary | ICD-10-CM

## 2020-10-20 ENCOUNTER — Other Ambulatory Visit (HOSPITAL_COMMUNITY)
Admission: RE | Admit: 2020-10-20 | Discharge: 2020-10-20 | Disposition: A | Discharge status: Home / Self Care | Payer: Medicare Other | Source: Ambulatory Visit | Attending: Internal Medicine | Admitting: Internal Medicine

## 2020-10-20 ENCOUNTER — Other Ambulatory Visit: Payer: Self-pay

## 2020-10-20 DIAGNOSIS — Z20822 Contact with and (suspected) exposure to covid-19: Secondary | ICD-10-CM | POA: Insufficient documentation

## 2020-10-20 DIAGNOSIS — Z01812 Encounter for preprocedural laboratory examination: Secondary | ICD-10-CM | POA: Diagnosis not present

## 2020-10-20 MED ORDER — OXYCODONE-ACETAMINOPHEN 7.5-325 MG PO TABS
ORAL_TABLET | ORAL | 0 refills | Status: DC
Start: 2020-10-20 — End: 2020-10-26

## 2020-10-20 NOTE — Progress Notes (Signed)
I called patient's home and mobile phone. Both her voicemail boxes were full. I'll try again later. I called back a little after 1:00, her niece answered. She said she woke her Aunt up and reminded her of her appointment. Patient must have went back to sleep. Her niece is calling her as I type. Hopefully she'll call back and let me know when she can come. I called her niece back and she is going to make sure her Celine Ahr gets here before 4:00.

## 2020-10-21 LAB — SARS CORONAVIRUS 2 (TAT 6-24 HRS): SARS Coronavirus 2: NEGATIVE

## 2020-10-22 ENCOUNTER — Telehealth: Payer: Self-pay

## 2020-10-22 NOTE — Telephone Encounter (Signed)
Tried to call pt, spoke to niece (pt was asleep). TCS w/Propofol w/Dr. Jena Gauss ASA 2 rescheduled to 11/11/20--PM. Niece requested PM as pt isn't a morning person. COVID test 11/09/20. Endo scheduler informed. Appt letter and new instructions mailed. Pt has prep.

## 2020-10-22 NOTE — OR Nursing (Signed)
Patient left a voicemail that she ate and did not follow the instructions for her colonoscopy and she wanted to reschedule. Darlina Rumpf at the office notified.

## 2020-10-22 NOTE — Telephone Encounter (Signed)
Diane [name] at [facility] endo called office, pt had left message that she ate and needed to cancel TCS for today.  Dr. Jena Gauss, she is ASA 2 Propofol can she be rescheduled or does she need OV to reschedule?

## 2020-10-22 NOTE — Telephone Encounter (Signed)
I guess we can just reschedule

## 2020-10-26 ENCOUNTER — Other Ambulatory Visit: Payer: Self-pay

## 2020-10-26 DIAGNOSIS — G894 Chronic pain syndrome: Secondary | ICD-10-CM

## 2020-10-27 MED ORDER — OXYCODONE-ACETAMINOPHEN 7.5-325 MG PO TABS: ORAL_TABLET | ORAL | 0 refills | Status: DC

## 2020-10-28 ENCOUNTER — Encounter (HOSPITAL_COMMUNITY): Payer: Self-pay | Admitting: Psychiatry

## 2020-10-28 ENCOUNTER — Telehealth (INDEPENDENT_AMBULATORY_CARE_PROVIDER_SITE_OTHER): Payer: Medicare Other | Admitting: Psychiatry

## 2020-10-28 ENCOUNTER — Other Ambulatory Visit: Payer: Self-pay

## 2020-10-28 DIAGNOSIS — F5105 Insomnia due to other mental disorder: Secondary | ICD-10-CM

## 2020-10-28 DIAGNOSIS — F3130 Bipolar disorder, current episode depressed, mild or moderate severity, unspecified: Secondary | ICD-10-CM | POA: Diagnosis not present

## 2020-10-28 MED ORDER — FLUOXETINE HCL 20 MG PO CAPS: 20.0000 mg | ORAL_CAPSULE | Freq: Two times a day (BID) | ORAL | 2 refills | Status: DC

## 2020-10-28 MED ORDER — ALPRAZOLAM 0.5 MG PO TABS: 0.5000 mg | ORAL_TABLET | Freq: Three times a day (TID) | ORAL | 2 refills | Status: DC

## 2020-10-28 MED ORDER — DIVALPROEX SODIUM 125 MG PO DR TAB: 375.0000 mg | DELAYED_RELEASE_TABLET | Freq: Every day | ORAL | 2 refills | Status: DC

## 2020-10-28 MED ORDER — TRAZODONE HCL 100 MG PO TABS: 100.0000 mg | ORAL_TABLET | Freq: Every day | ORAL | 2 refills | Status: DC

## 2020-10-28 NOTE — Progress Notes (Signed)
Virtual Visit via Telephone Note  I connected with Diane [name] on 10/28/20 at 10:00 AM EST by telephone and verified that I am speaking with the correct person using two identifiers.  Location: Patient: home Provider: office   I discussed the limitations, risks, security and privacy concerns of performing an evaluation and management service by telephone and the availability of in person appointments. I also discussed with the patient that there may be a patient responsible charge related to this service. The patient expressed understanding and agreed to proceed.     I discussed the assessment and treatment plan with the patient. The patient was provided an opportunity to ask questions and all were answered. The patient agreed with the plan and demonstrated an understanding of the instructions.   The patient was advised to call back or seek an in-person evaluation if the symptoms worsen or if the condition fails to improve as anticipated.  I provided 15 minutes of non-face-to-face time during this encounter.   Diannia Rudereborah Aarib Pulido, MD  [facility] MD/PA/NP OP Progress Note  10/28/2020 10:14 AM Diane [name]  MRN:  [id number]  Chief Complaint:  Chief Complaint    Depression; Anxiety; Follow-up     HPI: This patient is a 70 year old black female who [place]  She is on disability.  The patient returns for follow-up after 3 months.  I briefly spoke with her niece as well.  The patient states that she is "depressed" she does not feel like doing anything or getting out of the bed.  However her niece reports that she is having spells of feeling dizzy and lightheaded.  This is making her feel unsteady and not wanting to do much.  She is not on any new medications.  I noticed that she is taking both Percocet and Xanax.  She claims she does not take them together.  She also takes trazodone at night.  Since she takes it 2 of the 0.5 mg Xanax at bedtime I am going to cut this down to just  1 at bedtime to try to prevent some of the dizzy spells.  I suggested the patient be seen today at primary care or soon as possible to make sure she is not having orthostatic hypotension or a cardiac reason for her lightheadedness.  She denies thoughts of self-harm or suicide and she is lucid.  She denies racing thoughts or any other manic symptoms. Visit Diagnosis:    ICD-10-CM   1. 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 Bipolar I disorder, most recent episode depressed (HCC)  F31.30 divalproex (DEPAKOTE) 125 MG DR tablet    Past Psychiatric History: Patient has a history [facility]. At that time she was using drugs and alcohol. She has been in outpatient care ever since  Past Medical History:  Past Medical History:  Diagnosis Date  . Allergic rhinitis   . Anxiety   . Arthritis   . Cataracts, bilateral   . Chronic back pain    Sees pain mamagement clinic  . Chronic pain syndrome   . Constipation   . Depression   . Essential hypertension   . GERD (gastroesophageal reflux disease)   . Headaches, cluster   . History of seizures   . Hypothyroid   . Mania (HCC)   . Mixed hyperlipidemia   . PTSD (post-traumatic stress disorder)   . Right lumbar radiculopathy   .  Sleep apnea    STOP BANG score=4  . 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[facility])     Past Surgical History:  Procedure Laterality Date  . BACK SURGERY    . BILATERAL OOPHORECTOMY    . CATARACT EXTRACTION W/PHACO Left 01/04/2016   Procedure: CATARACT EXTRACTION PHACO AND INTRAOCULAR LENS PLACEMENT (IOC);  Surgeon: Jethro Bolus, MD;  Location: [facility];  Service: Ophthalmology;  Laterality: Left;  CDE:4.70  . CATARACT EXTRACTION W/PHACO Right 01/18/2016   Procedure: CATARACT EXTRACTION PHACO AND INTRAOCULAR LENS PLACEMENT RIGHT EYE; CDE:  4.66;  Surgeon: Jethro Bolus, MD;  Location: [facility];  Service: Ophthalmology;  Laterality:  Right;  . CHOLECYSTECTOMY    . COLONOSCOPY  10/16/2008   IWP:[id number], otherwise normal rectum; pancolonic diverticula the remainder of the colonic mucosa appeared normal. Next TCS due 10/2013.  Marland Kitchen COLONOSCOPY  06/28/2006   RMR: Internal hemorrhoids. Diminutive rectal polyps, cold biopsied/ Pancolonic diverticula. Polyps in the right colon removed   . COLONOSCOPY WITH PROPOFOL N/A 05/20/2015   RMR: Pancolonic diverticulosis. Redundant colon   . ESOPHAGOGASTRODUODENOSCOPY  10/16/2008   NLZ:JQBHA hiatal hernia otherwise normal esophagus, stomach  D1, D2, status post passage of a 56-French Maloney dilator  . ESOPHAGOGASTRODUODENOSCOPY  06/28/2006   LPF:[id number] esophagus. Small hiatal hernia. Otherwise normal stomach, D1 and D2, status post passage of a 56 [place] Maloney dilator  . ESOPHAGOGASTRODUODENOSCOPY (EGD) WITH ESOPHAGEAL DILATION N/A 07/14/2013   RMR: Abnormal esophagus of uncertain significance-status post esophageal biospy. small hiatal hernia.   Marland Kitchen ESOPHAGOGASTRODUODENOSCOPY (EGD) WITH PROPOFOL N/A 05/20/2015   RMR: NORMAL EGD status post maloney dilation   . ESOPHAGOGASTRODUODENOSCOPY (EGD) WITH PROPOFOL N/A 12/23/2018   Dr. Jena Gauss: normal esophagus, s/p esophageal dilation.   Marland Kitchen FOOT SURGERY     left foot-  . INCISION AND DRAINAGE Right 01/03/2013   Procedure: INCISION AND DRAINAGE;  Surgeon: Vickki Hearing, MD;  Location: [facility];  Service: Orthopedics;  Laterality: Right;  . KNEE ARTHROSCOPY  01/2012   right  . KNEE ARTHROSCOPY WITH LATERAL RELEASE Right 01/03/2013   Procedure: Lateral Release Patella Right Knee;  Surgeon: Vickki Hearing, MD;  Location: [facility];  Service: Orthopedics;  Laterality: Right;  . LAPAROSCOPIC LYSIS OF ADHESIONS N/A 11/12/2019   Procedure: LAPAROSCOPIC LYSIS OF ADHESIONS, extensive; drainage of peritoneal cyst;  Surgeon: Lazaro Arms, MD;  Location: [facility];  Service: Gynecology;  Laterality: N/A;  . LUMBAR DISC SURGERY  L4-5  . MALONEY DILATION N/A  05/20/2015   Procedure: Elease Hashimoto DILATION;  Surgeon: Corbin Ade, MD;  Location: [facility];  Service: Endoscopy;  Laterality: N/A;  Maloney dilator # 54  . MALONEY DILATION N/A 12/23/2018   Procedure: Elease Hashimoto DILATION;  Surgeon: Corbin Ade, MD;  Location: [facility];  Service: Endoscopy;  Laterality: N/A;  . PARTIAL HYSTERECTOMY    . TOTAL KNEE ARTHROPLASTY  07/01/2012   Procedure: TOTAL KNEE ARTHROPLASTY;  Surgeon: Vickki Hearing, MD;  Location: [facility];  Service: Orthopedics;  Laterality: Right;  . TOTAL KNEE REVISION Right 01/03/2013   Procedure: Patellaplasty Right Knee;  Surgeon: Vickki Hearing, MD;  Location: [facility];  Service: Orthopedics;  Laterality: Right;    Family Psychiatric History: see below  Family History:  Family History  Problem Relation Age of Onset  . Alcohol abuse Mother   . Anxiety disorder Mother   . Alcohol abuse Father   . Anxiety disorder Father   . Bipolar disorder Sister   . Alcohol abuse Brother   . Anxiety  disorder Brother   . Drug abuse Brother   . Dementia Paternal Aunt   . ADD / ADHD Grandchild   . ADD / ADHD Grandchild   . Drug abuse Brother   . Alcohol abuse Brother   . Anxiety disorder Brother   . Seizures Brother   . Alcohol abuse Brother   . Anxiety disorder Brother   . Seizures Brother   . Alcohol abuse Brother   . Anxiety disorder Brother   . Alcohol abuse Brother   . Anxiety disorder Brother   . Heart disease Other   . Diabetes Other   . Alcohol abuse Other   . Depression Neg Hx   . OCD Neg Hx   . Paranoid behavior Neg Hx   . Schizophrenia Neg Hx   . Sexual abuse Neg Hx   . Physical abuse Neg Hx   . Colon cancer Neg Hx     Social History:  Social History   Socioeconomic History  . Marital status: Legally Separated    Spouse name: Not on file  . Number of children: Not on file  . Years of education: 12th grade  . Highest education level: Not on file  Occupational History  . Occupation: disabled    Associate Professor:  UNEMPLOYED  Tobacco Use  . Smoking status: Former Smoker    Packs/day: 0.25    Years: 5.00    Pack years: 1.25    Types: Cigarettes    Quit date: 06/26/2003    Years since quitting: 17.3  . Smokeless tobacco: Never Used  Vaping Use  . Vaping Use: Never used  Substance and Sexual Activity  . Alcohol use: Yes    Alcohol/week: 0.0 standard drinks    Comment: occasionally; once a month.  . Drug use: Not Currently    Comment: has a past history of street drug use  . Sexual activity: Yes    Birth control/protection: Surgical  Other Topics Concern  . Not on file  Social History Narrative  . Not on file   Social Determinants of Health   Financial Resource Strain: Not on file  Food Insecurity: Not on file  Transportation Needs: Not on file  Physical Activity: Not on file  Stress: Not on file  Social Connections: Not on file    Allergies:  Allergies  Allergen Reactions  . Neomycin-Bacitracin Zn-Polymyx Other (See Comments)    Reaction: skin starts to become raw and peels off  . Penicillins Hives  . Sulfonamide Derivatives Hives and Swelling    Metabolic Disorder Labs: Lab Results  Component Value Date   HGBA1C 5.5 02/19/2020   MPG 111.15 02/19/2020   MPG 120 (H) 01/25/2012   No results found for: PROLACTIN Lab Results  Component Value Date   CHOL  12/10/2010    69        ATP III CLASSIFICATION:  <200     mg/dL   Desirable  557-322  mg/dL   Borderline High  >=025    mg/dL   High          TRIG 38 12/10/2010   HDL 28 (L) 12/10/2010   CHOLHDL 2.5 12/10/2010   VLDL 8 12/10/2010   LDLCALC  12/10/2010    33        Total Cholesterol/HDL:CHD Risk Coronary Heart Disease Risk Table                     Men   Women  1/2 Average Risk   3.4  3.3  Average Risk       5.0   4.4  2 X Average Risk   9.6   7.1  3 X Average Risk  23.4   11.0        Use the calculated Patient Ratio above and the CHD Risk Table to determine the patient's CHD Risk.        ATP III  CLASSIFICATION (LDL):  <100     mg/dL   Optimal  409-811  mg/dL   Near or Above                    Optimal  130-159  mg/dL   Borderline  914-782  mg/dL   High  >956     mg/dL   Very High   LDLCALC 71 07/12/2009   Lab Results  Component Value Date   TSH 7.105 (H) 02/18/2020   TSH 0.52 02/11/2018    Therapeutic Level Labs: No results found for: LITHIUM Lab Results  Component Value Date   VALPROATE 70.7 09/24/2018   VALPROATE 71.5 01/30/2017   No components found for:  CBMZ  Current Medications: Current Outpatient Medications  Medication Sig Dispense Refill  . ALPRAZolam (XANAX) 0.5 MG tablet Take 1 tablet (0.5 mg total) by mouth 3 (three) times daily. 90 tablet 2  . ascorbic acid (VITAMIN C) 500 MG tablet Take 500 mg by mouth daily.    Marland Kitchen atorvastatin (LIPITOR) 20 MG tablet Take 20 mg by mouth daily.    Marland Kitchen BLACK ELDERBERRY PO Take 1 tablet by mouth daily.    Marland Kitchen CLENPIQ 10-3.5-12 MG-GM -GM/160ML SOLN Take 320 mLs by mouth as directed.    . cyclobenzaprine (FLEXERIL) 10 MG tablet TAKE ONE TABLET BY MOUTH TWICE DAILY AS NEEDED FOR MUSCLE SPASMS. (Patient taking differently: Take 10 mg by mouth 2 (two) times daily as needed for muscle spasms.) 60 tablet 5  . divalproex (DEPAKOTE) 125 MG DR tablet Take 3 tablets (375 mg total) by mouth at bedtime. 90 tablet 2  . ferrous sulfate 325 (65 FE) MG tablet Take 325 mg by mouth daily with breakfast.    . FLUoxetine (PROZAC) 20 MG capsule Take 1 capsule (20 mg total) by mouth 2 (two) times daily. 60 capsule 2  . hydrocortisone (ANUSOL-HC) 2.5 % rectal cream Place 1 application rectally 2 (two) times daily. (Patient taking differently: Place 1 application rectally as needed.) 30 g 1  . levothyroxine (SYNTHROID) 125 MCG tablet Take 125 mcg by mouth daily before breakfast.    . lisinopril (PRINIVIL,ZESTRIL) 10 MG tablet Take 10 mg by mouth at bedtime.     Marland Kitchen lubiprostone (AMITIZA) 24 MCG capsule TAKE ONE CAPSULE BY MOUTH TWICE DAILY. TAKE WITH A  MEAL. (Patient taking differently: Take 24 mcg by mouth 2 (two) times daily with a meal.) 60 capsule 11  . meloxicam (MOBIC) 7.5 MG tablet TAKE ONE TABLET BY MOUTH EVERY DAY (Patient taking differently: Take 7.5 mg by mouth daily.) 60 tablet 2  . Naldemedine Tosylate (SYMPROIC) 0.2 MG TABS Take 0.2 mg by mouth daily. (Patient not taking: Reported on 09/20/2020) 30 tablet 2  . oxyCODONE-acetaminophen (PERCOCET) 7.5-325 MG tablet One q 4 hrs prn pain 42 tablet 0  . pantoprazole (PROTONIX) 40 MG tablet TAKE ONE TABLET BY MOUTH TWICE DAILY (Patient taking differently: Take 40 mg by mouth daily.) 60 tablet 5  . traZODone (DESYREL) 100 MG tablet Take 1 tablet (100 mg total) by mouth at bedtime. 30 tablet 2  No current facility-administered medications for this visit.     Musculoskeletal: Strength & Muscle Tone: decreased Gait & Station: unsteady Patient leans: N/A  Psychiatric Specialty Exam: Review of Systems  Musculoskeletal: Positive for arthralgias and back pain.  Neurological: Positive for dizziness and light-headedness.  Psychiatric/Behavioral: Positive for dysphoric mood.  All other systems reviewed and are negative.   There were no vitals taken for this visit.There is no height or weight on file to calculate BMI.  General Appearance: NA  Eye Contact:  NA  Speech:  Clear and Coherent  Volume:  Decreased  Mood:  Dysphoric  Affect:  NA  Thought Process:  Goal Directed  Orientation:  Full (Time, Place, and Person)  Thought Content: Rumination   Suicidal Thoughts:  No  Homicidal Thoughts:  No  Memory:  Immediate;   Good  Judgement:  Fair  Insight:  Fair  Psychomotor Activity:  Decreased  Concentration:  Concentration: Good and Attention Span: Good  Recall:  Good  Fund of Knowledge: Fair  Language: Good  Akathisia:  No  Handed:  Right  AIMS (if indicated): not done  Assets:  Communication Skills Desire for Improvement Resilience Social Support Talents/Skills  ADL's:   Intact  Cognition: WNL  Sleep:  Good   Screenings:   Assessment and Plan: This patient is a 70 year old female with a history of depression anxiety chronic pain and remote history of alcohol abuse.  Her niece reports that she is having more lightheaded episodes.  I strongly suggested she get seen by primary care as we have no way to measure her blood pressure or do lab work.  I will cut down her Xanax to 0.5 mg 3 times a day.  She will Prozac 20 mg, Jester Klingberg 5 mg for mood stabilization daily and trazodone 100 mg at bedtime for sleep.  She will return to see me in 4 weeks or call sooner as needed   [name], MD 10/28/2020, 10:14 AM

## 2020-11-02 ENCOUNTER — Other Ambulatory Visit: Payer: Self-pay

## 2020-11-02 DIAGNOSIS — G894 Chronic pain syndrome: Secondary | ICD-10-CM

## 2020-11-03 ENCOUNTER — Other Ambulatory Visit: Payer: Self-pay | Admitting: Orthopedic Surgery

## 2020-11-03 DIAGNOSIS — M1711 Unilateral primary osteoarthritis, right knee: Secondary | ICD-10-CM

## 2020-11-03 DIAGNOSIS — M25562 Pain in left knee: Secondary | ICD-10-CM

## 2020-11-03 DIAGNOSIS — G894 Chronic pain syndrome: Secondary | ICD-10-CM

## 2020-11-03 DIAGNOSIS — S8002XA Contusion of left knee, initial encounter: Secondary | ICD-10-CM

## 2020-11-03 MED ORDER — OXYCODONE-ACETAMINOPHEN 7.5-325 MG PO TABS: ORAL_TABLET | ORAL | 0 refills | Status: DC

## 2020-11-03 NOTE — Telephone Encounter (Signed)
Rx requests 

## 2020-11-09 ENCOUNTER — Other Ambulatory Visit: Payer: Self-pay

## 2020-11-09 ENCOUNTER — Other Ambulatory Visit (HOSPITAL_COMMUNITY)
Admission: RE | Admit: 2020-11-09 | Discharge: 2020-11-09 | Disposition: A | Discharge status: Home / Self Care | Payer: Medicare Other | Source: Ambulatory Visit | Attending: Internal Medicine | Admitting: Internal Medicine

## 2020-11-09 DIAGNOSIS — G894 Chronic pain syndrome: Secondary | ICD-10-CM

## 2020-11-09 DIAGNOSIS — Z20822 Contact with and (suspected) exposure to covid-19: Secondary | ICD-10-CM | POA: Diagnosis not present

## 2020-11-09 DIAGNOSIS — Z01812 Encounter for preprocedural laboratory examination: Secondary | ICD-10-CM | POA: Insufficient documentation

## 2020-11-10 ENCOUNTER — Other Ambulatory Visit: Payer: Self-pay | Admitting: Orthopedic Surgery

## 2020-11-10 DIAGNOSIS — G894 Chronic pain syndrome: Secondary | ICD-10-CM

## 2020-11-10 LAB — SARS CORONAVIRUS 2 (TAT 6-24 HRS): SARS Coronavirus 2: NEGATIVE

## 2020-11-10 NOTE — Telephone Encounter (Signed)
Rx request 

## 2020-11-11 ENCOUNTER — Encounter (HOSPITAL_COMMUNITY): Payer: Self-pay | Admitting: Internal Medicine

## 2020-11-11 ENCOUNTER — Encounter (HOSPITAL_COMMUNITY)
Admission: RE | Disposition: A | Discharge status: Home / Self Care | Payer: Self-pay | Source: Ambulatory Visit | Attending: Internal Medicine

## 2020-11-11 ENCOUNTER — Ambulatory Visit (HOSPITAL_COMMUNITY): Payer: Medicare Other | Admitting: Anesthesiology

## 2020-11-11 ENCOUNTER — Ambulatory Visit (HOSPITAL_COMMUNITY)
Admission: RE | Admit: 2020-11-11 | Discharge: 2020-11-11 | Disposition: A | Discharge status: Home / Self Care | Payer: Medicare Other | Source: Ambulatory Visit | Attending: Internal Medicine | Admitting: Internal Medicine

## 2020-11-11 ENCOUNTER — Other Ambulatory Visit: Payer: Self-pay

## 2020-11-11 DIAGNOSIS — K5909 Other constipation: Secondary | ICD-10-CM | POA: Insufficient documentation

## 2020-11-11 DIAGNOSIS — Z96651 Presence of right artificial knee joint: Secondary | ICD-10-CM | POA: Diagnosis not present

## 2020-11-11 DIAGNOSIS — Z86010 Personal history of colonic polyps: Secondary | ICD-10-CM | POA: Insufficient documentation

## 2020-11-11 DIAGNOSIS — G4730 Sleep apnea, unspecified: Secondary | ICD-10-CM | POA: Diagnosis not present

## 2020-11-11 DIAGNOSIS — K640 First degree hemorrhoids: Secondary | ICD-10-CM | POA: Insufficient documentation

## 2020-11-11 DIAGNOSIS — Z791 Long term (current) use of non-steroidal anti-inflammatories (NSAID): Secondary | ICD-10-CM | POA: Insufficient documentation

## 2020-11-11 DIAGNOSIS — Z79890 Hormone replacement therapy (postmenopausal): Secondary | ICD-10-CM | POA: Insufficient documentation

## 2020-11-11 DIAGNOSIS — Z87891 Personal history of nicotine dependence: Secondary | ICD-10-CM | POA: Diagnosis not present

## 2020-11-11 DIAGNOSIS — Z79899 Other long term (current) drug therapy: Secondary | ICD-10-CM | POA: Diagnosis not present

## 2020-11-11 DIAGNOSIS — K5730 Diverticulosis of large intestine without perforation or abscess without bleeding: Secondary | ICD-10-CM | POA: Insufficient documentation

## 2020-11-11 DIAGNOSIS — K6389 Other specified diseases of intestine: Secondary | ICD-10-CM | POA: Insufficient documentation

## 2020-11-11 DIAGNOSIS — Z1211 Encounter for screening for malignant neoplasm of colon: Secondary | ICD-10-CM | POA: Insufficient documentation

## 2020-11-11 HISTORY — PX: COLONOSCOPY WITH PROPOFOL: SHX5780

## 2020-11-11 SURGERY — COLONOSCOPY WITH PROPOFOL
Anesthesia: General

## 2020-11-11 MED ORDER — LIDOCAINE HCL (CARDIAC) PF 50 MG/5ML IV SOSY
PREFILLED_SYRINGE | INTRAVENOUS | Status: DC | PRN
  Administered 2020-11-11: 50 mg via INTRAVENOUS

## 2020-11-11 MED ORDER — STERILE WATER FOR IRRIGATION IR SOLN
Status: DC | PRN
  Administered 2020-11-11: 1.5 mL

## 2020-11-11 MED ORDER — OXYCODONE-ACETAMINOPHEN 7.5-325 MG PO TABS: ORAL_TABLET | ORAL | 0 refills | Status: DC

## 2020-11-11 MED ORDER — PROPOFOL 500 MG/50ML IV EMUL
INTRAVENOUS | Status: DC | PRN
  Administered 2020-11-11 (×2): 100 ug/kg/min via INTRAVENOUS

## 2020-11-11 MED ORDER — LACTATED RINGERS IV SOLN: INTRAVENOUS | Status: DC

## 2020-11-11 MED ORDER — PROPOFOL 10 MG/ML IV BOLUS
INTRAVENOUS | Status: DC | PRN
  Administered 2020-11-11: 100 mg via INTRAVENOUS
  Administered 2020-11-11: 20 mg via INTRAVENOUS

## 2020-11-11 NOTE — H&P (Signed)
@LOGO @   Primary Care Physician:  , MD Primary Gastroenterologist:  Dr. Elfredia Nevins  Pre-Procedure History & Physical: HPI:  Diane [name] is a 70 y.o. female here for surveillance colonoscopy.  History of colonic adenoma.  Chronically constipated.  Past Medical History:  Diagnosis Date  . Allergic rhinitis   . Anxiety   . Arthritis   . Cataracts, bilateral   . Chronic back pain    Sees pain mamagement clinic  . Chronic pain syndrome   . Constipation   . Depression   . Essential hypertension   . GERD (gastroesophageal reflux disease)   . Headaches, cluster   . History of seizures   . Hypothyroid   . Mania (HCC)   . Mixed hyperlipidemia   . PTSD (post-traumatic stress disorder)   . Right lumbar radiculopathy   . Sleep apnea    STOP BANG score=4  . 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[facility]     Past Surgical History:  Procedure Laterality Date  . BACK SURGERY    . BILATERAL OOPHORECTOMY    . CATARACT EXTRACTION W/PHACO Left 01/04/2016   Procedure: CATARACT EXTRACTION PHACO AND INTRAOCULAR LENS PLACEMENT (IOC);  Surgeon: 01/06/2016, MD;  Location: [facility];  Service: Ophthalmology;  Laterality: Left;  CDE:4.70  . CATARACT EXTRACTION W/PHACO Right 01/18/2016   Procedure: CATARACT EXTRACTION PHACO AND INTRAOCULAR LENS PLACEMENT RIGHT EYE; CDE:  4.66;  Surgeon: 03/19/2016, MD;  Location: [facility];  Service: Ophthalmology;  Laterality: Right;  . CHOLECYSTECTOMY    . COLONOSCOPY  10/16/2008   12/14/2008, otherwise normal rectum; pancolonic diverticula the remainder of the colonic mucosa appeared normal. Next TCS due 10/2013.  11/2013 COLONOSCOPY  06/28/2006   RMR: Internal hemorrhoids. Diminutive rectal polyps, cold biopsied/ Pancolonic diverticula. Polyps in the right colon removed   . COLONOSCOPY WITH PROPOFOL N/A 05/20/2015   RMR: Pancolonic diverticulosis. Redundant colon   . ESOPHAGOGASTRODUODENOSCOPY  10/16/2008   12/14/2008 hiatal hernia otherwise normal esophagus, stomach   D1, D2, status post passage of a 56-French Maloney dilator  . ESOPHAGOGASTRODUODENOSCOPY  06/28/2006   06/30/2006 esophagus. Small hiatal hernia. Otherwise normal stomach, D1 and D2, status post passage of a 56 [id number] Maloney dilator  . ESOPHAGOGASTRODUODENOSCOPY (EGD) WITH ESOPHAGEAL DILATION N/A 07/14/2013   RMR: Abnormal esophagus of uncertain significance-status post esophageal biospy. small hiatal hernia.   13/11/2012 ESOPHAGOGASTRODUODENOSCOPY (EGD) WITH PROPOFOL N/A 05/20/2015   RMR: NORMAL EGD status post maloney dilation   . ESOPHAGOGASTRODUODENOSCOPY (EGD) WITH PROPOFOL N/A 12/23/2018   Dr. 12/25/2018: normal esophagus, s/p esophageal dilation.   Jena Gauss FOOT SURGERY     left foot-  . INCISION AND DRAINAGE Right 01/03/2013   Procedure: INCISION AND DRAINAGE;  Surgeon: 01/05/2013, MD;  Location: [facility];  Service: Orthopedics;  Laterality: Right;  . KNEE ARTHROSCOPY  01/2012   right  . KNEE ARTHROSCOPY WITH LATERAL RELEASE Right 01/03/2013   Procedure: Lateral Release Patella Right Knee;  Surgeon: 01/05/2013, MD;  Location: [facility];  Service: Orthopedics;  Laterality: Right;  . LAPAROSCOPIC LYSIS OF ADHESIONS N/A 11/12/2019   Procedure: LAPAROSCOPIC LYSIS OF ADHESIONS, extensive; drainage of peritoneal cyst;  Surgeon: 01/12/2020, MD;  Location: [facility];  Service: Gynecology;  Laterality: N/A;  . LUMBAR DISC SURGERY  L4-5  . MALONEY DILATION N/A 05/20/2015   Procedure: 07/20/2015 DILATION;  Surgeon: Elease Hashimoto, MD;  Location: [facility];  Service: Endoscopy;  Laterality: N/A;  Maloney dilator # 54  . MALONEY DILATION N/A 12/23/2018  Procedure: MALONEY DILATION;  Surgeon: Corbin Ade, MD;  Location: [facility];  Service: Endoscopy;  Laterality: N/A;  . PARTIAL HYSTERECTOMY    . TOTAL KNEE ARTHROPLASTY  07/01/2012   Procedure: TOTAL KNEE ARTHROPLASTY;  Surgeon: Vickki Hearing, MD;  Location: [facility];  Service: Orthopedics;  Laterality: Right;  . TOTAL KNEE REVISION Right 01/03/2013    Procedure: Patellaplasty Right Knee;  Surgeon: Vickki Hearing, MD;  Location: [facility];  Service: Orthopedics;  Laterality: Right;    Prior to Admission medications   Medication Sig Start Date End Date Taking? Authorizing Provider  ALPRAZolam Prudy Feeler) 0.5 MG tablet Take 1 tablet (0.5 mg total) by mouth 3 (three) times daily. 10/28/20  Yes Myrlene Broker, MD  ascorbic acid (VITAMIN C) 500 MG tablet Take 500 mg by mouth daily.   Yes [provider]  atorvastatin (LIPITOR) 20 MG tablet Take 20 mg by mouth daily.   Yes [provider]  BLACK ELDERBERRY PO Take 1 tablet by mouth daily.   Yes [provider]  CLENPIQ 10-3.5-12 MG-GM -GM/160ML SOLN Take 320 mLs by mouth as directed. 08/24/20  Yes [provider]  cyclobenzaprine (FLEXERIL) 10 MG tablet Take 1 tablet (10 mg total) by mouth 2 (two) times daily as needed for muscle spasms. 11/04/20 12/04/20 Yes Vickki Hearing, MD  divalproex (DEPAKOTE) 125 MG DR tablet Take 3 tablets (375 mg total) by mouth at bedtime. 10/28/20  Yes Myrlene Broker, MD  ferrous sulfate 325 (65 FE) MG tablet Take 325 mg by mouth daily with breakfast.   Yes [provider]  FLUoxetine (PROZAC) 20 MG capsule Take 1 capsule (20 mg total) by mouth 2 (two) times daily. 10/28/20  Yes Myrlene Broker, MD  hydrocortisone (ANUSOL-HC) 2.5 % rectal cream Place 1 application rectally 2 (two) times daily. Patient taking differently: Place 1 application rectally as needed. 02/06/18  Yes Gelene Mink, NP  levothyroxine (SYNTHROID) 125 MCG tablet Take 125 mcg by mouth daily before breakfast. 03/29/20  Yes [provider]  lisinopril (PRINIVIL,ZESTRIL) 10 MG tablet Take 10 mg by mouth at bedtime.    Yes [provider]  lubiprostone (AMITIZA) 24 MCG capsule TAKE ONE CAPSULE BY MOUTH TWICE DAILY. TAKE WITH A MEAL. Patient taking differently: Take 24 mcg by mouth 2 (two) times daily with a meal. 08/04/20  Yes Tiffany Kocher, PA-C   meloxicam (MOBIC) 7.5 MG tablet Take 1 tablet (7.5 mg total) by mouth daily. 11/04/20 12/04/20 Yes Vickki Hearing, MD  oxyCODONE-acetaminophen (PERCOCET) 7.5-325 MG tablet One q 4 hrs prn pain 11/03/20  Yes Vickki Hearing, MD  pantoprazole (PROTONIX) 40 MG tablet TAKE ONE TABLET BY MOUTH TWICE DAILY Patient taking differently: Take 40 mg by mouth daily. 07/06/20  Yes Tiffany Kocher, PA-C  traZODone (DESYREL) 100 MG tablet Take 1 tablet (100 mg total) by mouth at bedtime. 10/28/20  Yes Myrlene Broker, MD  Naldemedine Tosylate (SYMPROIC) 0.2 MG TABS Take 0.2 mg by mouth daily. Patient not taking: Reported on 09/20/2020 05/14/20   Tiffany Kocher, PA-C    Allergies as of 08/24/2020 - Review Complete 08/24/2020  Allergen Reaction Noted  . Neomycin-bacitracin zn-polymyx Other (See Comments) 02/27/2008  . Penicillins Hives   . Sulfonamide derivatives Hives and Swelling 02/27/2008    Family History  Problem Relation Age of Onset  . Alcohol abuse Mother   . Anxiety disorder Mother   . Alcohol abuse Father   . Anxiety  disorder Father   . Bipolar disorder Sister   . Alcohol abuse Brother   . Anxiety disorder Brother   . Drug abuse Brother   . Dementia Paternal Aunt   . ADD / ADHD Grandchild   . ADD / ADHD Grandchild   . Drug abuse Brother   . Alcohol abuse Brother   . Anxiety disorder Brother   . Seizures Brother   . Alcohol abuse Brother   . Anxiety disorder Brother   . Seizures Brother   . Alcohol abuse Brother   . Anxiety disorder Brother   . Alcohol abuse Brother   . Anxiety disorder Brother   . Heart disease Other   . Diabetes Other   . Alcohol abuse Other   . Depression Neg Hx   . OCD Neg Hx   . Paranoid behavior Neg Hx   . Schizophrenia Neg Hx   . Sexual abuse Neg Hx   . Physical abuse Neg Hx   . Colon cancer Neg Hx     Social History   Socioeconomic History  . Marital status: Legally Separated    Spouse name: Not on file  . Number of children: Not on file   . Years of education: 12th grade  . Highest education level: Not on file  Occupational History  . Occupation: disabled    Associate Professor: UNEMPLOYED  Tobacco Use  . Smoking status: Former Smoker    Packs/day: 0.25    Years: 5.00    Pack years: 1.25    Types: Cigarettes    Quit date: 06/26/2003    Years since quitting: 17.3  . Smokeless tobacco: Never Used  Vaping Use  . Vaping Use: Never used  Substance and Sexual Activity  . Alcohol use: Yes    Alcohol/week: 0.0 standard drinks    Comment: occasionally; once a month.  . Drug use: Not Currently    Comment: has a past history of street drug use  . Sexual activity: Yes    Birth control/protection: Surgical  Other Topics Concern  . Not on file  Social History Narrative  . Not on file   Social Determinants of Health   Financial Resource Strain: Not on file  Food Insecurity: Not on file  Transportation Needs: Not on file  Physical Activity: Not on file  Stress: Not on file  Social Connections: Not on file  Intimate Partner Violence: Not on file    Review of Systems: See HPI, otherwise negative ROS  Physical Exam: BP 132/76   Pulse 77   Temp 98 F (36.7 C) (Oral)   Resp 15   Ht 5\' 3"  (1.6 m)   Wt 87.1 kg   SpO2 96%   BMI 34.01 kg/m  General:   Alert,  Well-developed, well-nourished, pleasant and cooperative in NAD  Mouth:  No deformity or lesions. Neck:  Supple; no masses or thyromegaly. No significant cervical adenopathy. Lungs:  Clear throughout to auscultation.   No wheezes, crackles, or rhonchi. No acute distress. Heart:  Regular rate and rhythm; no murmurs, clicks, rubs,  or gallops. Abdomen: Non-distended, normal bowel sounds.  Soft and nontender without appreciable mass or hepatosplenomegaly.  Pulses:  Normal pulses noted. Extremities:  Without clubbing or edema.  Impression/Plan: 70 year old lady with a history of colonic adenoma; here for surveillance colonoscopy per plan.  I have offered the patient a  surveillance colonoscopy. The risks, benefits, limitations, alternatives and imponderables have been reviewed with the patient. Questions have been answered. All parties are agreeable.  Notice: This dictation was prepared with Dragon dictation along with smaller phrase technology. Any transcriptional errors that result from this process are unintentional and may not be corrected upon review.

## 2020-11-11 NOTE — Anesthesia Postprocedure Evaluation (Signed)
Anesthesia Post Note  Patient: Diane [name]  Procedure(s) Performed: COLONOSCOPY WITH PROPOFOL (N/A )  Patient location during evaluation: Endoscopy Anesthesia Type: General Level of consciousness: awake and alert and patient cooperative Pain management: satisfactory to patient Vital Signs Assessment: post-procedure vital signs reviewed and stable Respiratory status: spontaneous breathing Cardiovascular status: stable Postop Assessment: no apparent nausea or vomiting Anesthetic complications: no   No complications documented.   Last Vitals:  Vitals:   11/11/20 1212 11/11/20 1256  BP: 132/76 102/61  Pulse: 77 65  Resp: 15 20  Temp: 36.7 C (!) 36.4 C  SpO2: 96% 98%    Last Pain:  Vitals:   11/11/20 1256  TempSrc: Oral  PainSc: 0-No pain                 Alden Bensinger

## 2020-11-11 NOTE — Discharge Instructions (Signed)
Colonoscopy Discharge Instructions  Read the instructions outlined below and refer to this sheet in the next few weeks. These discharge instructions provide you with general information on caring for yourself after you leave the hospital. Your doctor may also give you specific instructions. While your treatment has been planned according to the most current medical practices available, unavoidable complications occasionally occur. If you have any problems or questions after discharge, call Dr. Jena Gauss at [phone]. ACTIVITY  You may resume your regular activity, but move at a slower pace for the next 24 hours.   Take frequent rest periods for the next 24 hours.   Walking will help get rid of the air and reduce the bloated feeling in your belly (abdomen).   No driving for 24 hours (because of the medicine (anesthesia) used during the test).    Do not sign any important legal documents or operate any machinery for 24 hours (because of the anesthesia used during the test).  NUTRITION  Drink plenty of fluids.   You may resume your normal diet as instructed by your doctor.   Begin with a light meal and progress to your normal diet. Heavy or fried foods are harder to digest and may make you feel sick to your stomach (nauseated).   Avoid alcoholic beverages for 24 hours.  MEDICATIONS  You may resume your normal medications unless your doctor tells you otherwise.  WHAT YOU CAN EXPECT TODAY  Some feelings of bloating in the abdomen.   Passage of more gas than usual.   Spotting of blood in your stool or on the toilet paper.  Marland Kitchen FINDING OUT THE RESULTS OF YOUR TEST Not all test results are available during your visit. If your test results are not back during the visit, make an appointment with your caregiver to find out the results. Do not assume everything is normal if you have not heard from your caregiver or the medical facility. It is important for you to follow up on all of your test  results.  SEEK IMMEDIATE MEDICAL ATTENTION IF:  You have more than a spotting of blood in your stool.   Your belly is swollen (abdominal distention).   You are nauseated or vomiting.   You have a temperature over 101.   You have abdominal pain or discomfort that is severe or gets worse throughout the day.    No polyps found today.  Diverticulosis information provided  A future colonoscopy is not recommended unless new symptoms develop  Office visit with [place] in 6 months  At patient request, I called [name] at [phone] -reviewed results and recommendations   Diverticulosis  Diverticulosis is a condition that develops when small pouches (diverticula) form in the wall of the large intestine (colon). The colon is where water is absorbed and stool (feces) is formed. The pouches form when the inside layer of the colon pushes through weak spots in the outer layers of the colon. You may have a few pouches or many of them. The pouches usually do not cause problems unless they become inflamed or infected. When this happens, the condition is called diverticulitis. What are the causes? The cause of this condition is not known. What increases the risk? The following factors may make you more likely to develop this condition:  Being older than age 87. Your risk for this condition increases with age. Diverticulosis is rare among people younger than age 74. By age 23, many people have it.  Eating a low-fiber diet.  Having frequent constipation.  Being overweight.  Not getting enough exercise.  Smoking.  Taking over-the-counter pain medicines, like aspirin and ibuprofen.  Having a family history of diverticulosis. What are the signs or symptoms? In most people, there are no symptoms of this condition. If you do have symptoms, they may include:  Bloating.  Cramps in the abdomen.  Constipation or diarrhea.  Pain in the lower left side of the abdomen. How is this  diagnosed? Because diverticulosis usually has no symptoms, it is most often diagnosed during an exam for other colon problems. The condition may be diagnosed by:  Using a flexible scope to examine the colon (colonoscopy).  Taking an X-ray of the colon after dye has been put into the colon (barium enema).  Having a CT scan. How is this treated? You may not need treatment for this condition. Your health care provider may recommend treatment to prevent problems. You may need treatment if you have symptoms or if you previously had diverticulitis. Treatment may include:  Eating a high-fiber diet.  Taking a fiber supplement.  Taking a live bacteria supplement (probiotic).  Taking medicine to relax your colon.   Follow these instructions at home: Medicines  Take over-the-counter and prescription medicines only as told by your health care provider.  If told by your health care provider, take a fiber supplement or probiotic. Constipation prevention Your condition may cause constipation. To prevent or treat constipation, you may need to:  Drink enough fluid to keep your urine pale yellow.  Take over-the-counter or prescription medicines.  Eat foods that are high in fiber, such as beans, whole grains, and fresh fruits and vegetables.  Limit foods that are high in fat and processed sugars, such as fried or sweet foods.   General instructions  Try not to strain when you have a bowel movement.  Keep all follow-up visits as told by your health care provider. This is important. Contact a health care provider if you:  Have pain in your abdomen.  Have bloating.  Have cramps.  Have not had a bowel movement in 3 days. Get help right away if:  Your pain gets worse.  Your bloating becomes very bad.  You have a fever or chills, and your symptoms suddenly get worse.  You vomit.  You have bowel movements that are bloody or black.  You have bleeding from your  rectum. Summary  Diverticulosis is a condition that develops when small pouches (diverticula) form in the wall of the large intestine (colon).  You may have a few pouches or many of them.  This condition is most often diagnosed during an exam for other colon problems.  Treatment may include increasing the fiber in your diet, taking supplements, or taking medicines. This information is not intended to replace advice given to you by your health care provider. Make sure you discuss any questions you have with your health care provider. Document Revised: 03/27/2019 Document Reviewed: 03/27/2019 Elsevier Patient Education  2021 [employer].

## 2020-11-11 NOTE — Anesthesia Preprocedure Evaluation (Addendum)
Anesthesia Evaluation  Patient identified by MRN, date of birth, ID band Patient awake    Reviewed: Allergy & Precautions, NPO status , Patient's Chart, lab work & pertinent test results  History of Anesthesia Complications Negative for: history of anesthetic complications  Airway Mallampati: II  TM Distance: >3 FB Neck ROM: Full    Dental  (+) Dental Advisory Given, Loose, Missing,  Slightly loose:   Pulmonary sleep apnea , former smoker,    Pulmonary exam normal breath sounds clear to auscultation       Cardiovascular Exercise Tolerance: Good hypertension, Pt. on medications Normal cardiovascular exam+ dysrhythmias (Prolonged QT interval)  Rhythm:Regular Rate:Normal     Neuro/Psych  Headaches, PSYCHIATRIC DISORDERS Anxiety Depression Bipolar Disorder  Neuromuscular disease    GI/Hepatic GERD  Medicated,(+)     substance abuse (benzos)  alcohol use,   Endo/Other  Hypothyroidism   Renal/GU negative Renal ROS  negative genitourinary   Musculoskeletal  (+) Arthritis  (back pain), narcotic dependent  Abdominal   Peds negative pediatric ROS (+)  Hematology  (+) anemia ,   Anesthesia Other Findings   Reproductive/Obstetrics                           Anesthesia Physical Anesthesia Plan  ASA: III  Anesthesia Plan: General   Post-op Pain Management:    Induction: Intravenous  PONV Risk Score and Plan: TIVA  Airway Management Planned: Nasal Cannula and Natural Airway  Additional Equipment:   Intra-op Plan:   Post-operative Plan:   Informed Consent: I have reviewed the patients History and Physical, chart, labs and discussed the procedure including the risks, benefits and alternatives for the proposed anesthesia with the patient or authorized representative who has indicated his/her understanding and acceptance.     Dental advisory given  Plan Discussed with: CRNA and  Surgeon  Anesthesia Plan Comments:         Anesthesia Quick Evaluation

## 2020-11-11 NOTE — Transfer of Care (Signed)
Immediate Anesthesia Transfer of Care Note  Patient: Diane [name]  Procedure(s) Performed: COLONOSCOPY WITH PROPOFOL (N/A )  Patient Location: Endoscopy Unit  Anesthesia Type:General  Level of Consciousness: awake and patient cooperative  Airway & Oxygen Therapy: Patient Spontanous Breathing  Post-op Assessment: Report given to RN and Post -op Vital signs reviewed and stable  Post vital signs: Reviewed and stable  Last Vitals:  Vitals Value Taken Time  BP    Temp    Pulse    Resp    SpO2    SEE VITAL SIGN FLOWSHEET  Last Pain:  Vitals:   11/11/20 1228  TempSrc:   PainSc: 0-No pain      Patients Stated Pain Goal: 8 (11/11/20 1212)  Complications: No complications documented.

## 2020-11-16 ENCOUNTER — Other Ambulatory Visit: Payer: Self-pay

## 2020-11-16 DIAGNOSIS — G894 Chronic pain syndrome: Secondary | ICD-10-CM

## 2020-11-17 ENCOUNTER — Telehealth (HOSPITAL_COMMUNITY): Payer: Self-pay | Admitting: Psychiatry

## 2020-11-17 MED ORDER — OXYCODONE-ACETAMINOPHEN 7.5-325 MG PO TABS
ORAL_TABLET | ORAL | 0 refills | Status: DC
Start: 2020-11-17 — End: 2020-11-24

## 2020-11-17 NOTE — Telephone Encounter (Signed)
Called to reschedule pt's appt, unable to leave vm due to voicemail box being full

## 2020-11-22 NOTE — Op Note (Signed)
[facility] Patient Name: Diane [name] Procedure Date: 11/11/2020 12:19 PM MRN: [id number] Date of Birth: 07-Sep-1951 Attending MD: Gennette Pacobert Michael Rayshad Riviello , MD CSN: [id number] Age: 70 Admit Type: Outpatient Procedure:                Colonoscopy Indications:              High risk colon cancer surveillance: Personal                            history of colonic polyps Providers:                Gennette Pacobert Michael Monifa Blanchette, MD, Angelica RanSusan Goss, Pandora LeiterNeville                            David, Technician Referring MD:              Medicines:                Propofol per Anesthesia Complications:            No immediate complications. Estimated Blood Loss:     Estimated blood loss: none. Estimated blood loss:                            none. Procedure:                Pre-Anesthesia Assessment:                           - Prior to the procedure, a History and Physical                            was performed, and patient medications and                            allergies were reviewed. The patient's tolerance of                            previous anesthesia was also reviewed. The risks                            and benefits of the procedure and the sedation                            options and risks were discussed with the patient.                            All questions were answered, and informed consent                            was obtained. Prior Anticoagulants: The patient has                            taken no previous anticoagulant or antiplatelet                            agents. ASA Grade Assessment: III -  A patient with                            severe systemic disease. After reviewing the risks                            and benefits, the patient was deemed in                            satisfactory condition to undergo the procedure.                           After obtaining informed consent, the colonoscope                            was passed under direct vision. Throughout the                             procedure, the patient's blood pressure, pulse, and                            oxygen saturations were monitored continuously. The                            CF-HQ190L ([id number]) scope was introduced through                            the anus and advanced to the the cecum, identified                            by appendiceal orifice and ileocecal valve. The                            colonoscopy was performed without difficulty. The                            patient tolerated the procedure well. The quality                            of the bowel preparation was adequate. Scope In: 12:37:07 PM Scope Out: 12:51:50 PM Scope Withdrawal Time: 0 hours 7 minutes 52 seconds  Total Procedure Duration: 0 hours 14 minutes 43 seconds  Findings:      The perianal and digital rectal examinations were normal.      A diffuse area of moderate melanosis was found in the entire colon.      Non-bleeding internal hemorrhoids were found during retroflexion. The       hemorrhoids were moderate, medium-sized and Grade I (internal       hemorrhoids that do not prolapse).      Multiple medium-mouthed diverticula were found in the entire colon.      The exam was otherwise without abnormality on direct and retroflexion       views. Impression:               - Melanosis in the colon.                           -  Non-bleeding internal hemorrhoids.                           - The examination was otherwise normal on direct                            and retroflexion views.                           - No specimens collected. Moderate Sedation:      Moderate (conscious) sedation was personally administered by an       anesthesia professional. The following parameters were monitored: oxygen       saturation, heart rate, blood pressure, respiratory rate, EKG, adequacy       of pulmonary ventilation, and response to care. Recommendation:           - Patient has a contact number available for                             emergencies. The signs and symptoms of potential                            delayed complications were discussed with the                            patient. Return to normal activities tomorrow.                            Written discharge instructions were provided to the                            patient.                           - Advance diet as tolerated.                           - Continue present medications.                           - No repeat colonoscopy due to age.                           - Return to GI clinic (date not yet determined). Procedure Code(s):        --- Professional ---                           [phone], Colonoscopy, flexible; diagnostic, including                            collection of specimen(s) by brushing or washing,                            when performed (separate procedure) Diagnosis Code(s):        --- Professional ---  Z86.010, Personal history of colonic polyps                           K63.89, Other specified diseases of intestine                           K64.0, First degree hemorrhoids CPT copyright 2019 [employer]. All rights reserved. The codes documented in this report are preliminary and upon coder review may  be revised to meet current compliance requirements. Gerrit Friends. Tashianna Broome, MD Gennette Pac, MD 11/22/2020 1:27:48 PM This report has been signed electronically. Number of Addenda: 0

## 2020-11-23 ENCOUNTER — Encounter (HOSPITAL_COMMUNITY): Payer: Self-pay | Admitting: Internal Medicine

## 2020-11-24 ENCOUNTER — Other Ambulatory Visit: Payer: Self-pay

## 2020-11-24 DIAGNOSIS — G894 Chronic pain syndrome: Secondary | ICD-10-CM

## 2020-11-25 ENCOUNTER — Telehealth (HOSPITAL_COMMUNITY): Payer: Medicare Other | Admitting: Psychiatry

## 2020-11-25 MED ORDER — OXYCODONE-ACETAMINOPHEN 7.5-325 MG PO TABS
ORAL_TABLET | ORAL | 0 refills | Status: DC
Start: 2020-11-25 — End: 2020-12-01

## 2020-12-01 ENCOUNTER — Other Ambulatory Visit: Payer: Self-pay

## 2020-12-01 DIAGNOSIS — G894 Chronic pain syndrome: Secondary | ICD-10-CM

## 2020-12-02 MED ORDER — OXYCODONE-ACETAMINOPHEN 7.5-325 MG PO TABS
ORAL_TABLET | ORAL | 0 refills | Status: DC
Start: 2020-12-02 — End: 2020-12-08

## 2020-12-03 ENCOUNTER — Other Ambulatory Visit: Payer: Self-pay | Admitting: Orthopedic Surgery

## 2020-12-03 DIAGNOSIS — G894 Chronic pain syndrome: Secondary | ICD-10-CM

## 2020-12-03 DIAGNOSIS — M1711 Unilateral primary osteoarthritis, right knee: Secondary | ICD-10-CM

## 2020-12-08 ENCOUNTER — Other Ambulatory Visit: Payer: Self-pay

## 2020-12-08 DIAGNOSIS — G894 Chronic pain syndrome: Secondary | ICD-10-CM

## 2020-12-09 MED ORDER — OXYCODONE-ACETAMINOPHEN 7.5-325 MG PO TABS: ORAL_TABLET | ORAL | 0 refills | Status: DC

## 2020-12-16 ENCOUNTER — Other Ambulatory Visit: Payer: Self-pay

## 2020-12-16 DIAGNOSIS — G894 Chronic pain syndrome: Secondary | ICD-10-CM

## 2020-12-17 MED ORDER — OXYCODONE-ACETAMINOPHEN 7.5-325 MG PO TABS
ORAL_TABLET | ORAL | 0 refills | Status: DC
Start: 2020-12-17 — End: 2020-12-23

## 2020-12-23 ENCOUNTER — Other Ambulatory Visit: Payer: Self-pay

## 2020-12-23 DIAGNOSIS — G894 Chronic pain syndrome: Secondary | ICD-10-CM

## 2020-12-27 MED ORDER — OXYCODONE-ACETAMINOPHEN 7.5-325 MG PO TABS
ORAL_TABLET | ORAL | 0 refills | Status: DC
Start: 2020-12-27 — End: 2021-01-01

## 2021-01-01 ENCOUNTER — Other Ambulatory Visit: Payer: Self-pay

## 2021-01-01 DIAGNOSIS — G894 Chronic pain syndrome: Secondary | ICD-10-CM

## 2021-01-03 ENCOUNTER — Other Ambulatory Visit (HOSPITAL_COMMUNITY): Payer: Self-pay | Admitting: Psychiatry

## 2021-01-03 ENCOUNTER — Other Ambulatory Visit: Payer: Self-pay | Admitting: Gastroenterology

## 2021-01-03 DIAGNOSIS — F5105 Insomnia due to other mental disorder: Secondary | ICD-10-CM

## 2021-01-03 DIAGNOSIS — F3130 Bipolar disorder, current episode depressed, mild or moderate severity, unspecified: Secondary | ICD-10-CM

## 2021-01-03 DIAGNOSIS — K219 Gastro-esophageal reflux disease without esophagitis: Secondary | ICD-10-CM

## 2021-01-03 MED ORDER — OXYCODONE-ACETAMINOPHEN 7.5-325 MG PO TABS: ORAL_TABLET | ORAL | 0 refills | Status: DC

## 2021-01-10 ENCOUNTER — Other Ambulatory Visit: Payer: Self-pay

## 2021-01-10 DIAGNOSIS — G894 Chronic pain syndrome: Secondary | ICD-10-CM

## 2021-01-10 MED ORDER — OXYCODONE-ACETAMINOPHEN 7.5-325 MG PO TABS: ORAL_TABLET | ORAL | 0 refills | Status: DC

## 2021-01-15 ENCOUNTER — Other Ambulatory Visit: Payer: Self-pay

## 2021-01-15 DIAGNOSIS — G894 Chronic pain syndrome: Secondary | ICD-10-CM

## 2021-01-17 MED ORDER — OXYCODONE-ACETAMINOPHEN 7.5-325 MG PO TABS: ORAL_TABLET | ORAL | 0 refills | Status: DC

## 2021-01-24 ENCOUNTER — Other Ambulatory Visit: Payer: Self-pay | Admitting: Orthopedic Surgery

## 2021-01-24 DIAGNOSIS — G894 Chronic pain syndrome: Secondary | ICD-10-CM

## 2021-01-24 MED ORDER — OXYCODONE-ACETAMINOPHEN 7.5-325 MG PO TABS: ORAL_TABLET | ORAL | 0 refills | Status: DC

## 2021-01-24 NOTE — Telephone Encounter (Signed)
Patient requests prescription for Oxycodone/Acetaminophen 7.5-325 mgs.  Qty  42  Sig: TAKE ONE TABLET BY MOUTH EVERY 4 HOURS AS NEEDED FOR PAIN  Patient states she uses [name]

## 2021-01-30 ENCOUNTER — Other Ambulatory Visit: Payer: Self-pay

## 2021-01-30 DIAGNOSIS — G894 Chronic pain syndrome: Secondary | ICD-10-CM

## 2021-01-31 MED ORDER — OXYCODONE-ACETAMINOPHEN 7.5-325 MG PO TABS
ORAL_TABLET | ORAL | 0 refills | Status: DC
Start: 2021-01-31 — End: 2021-02-08

## 2021-02-08 ENCOUNTER — Other Ambulatory Visit: Payer: Self-pay | Admitting: Orthopedic Surgery

## 2021-02-08 DIAGNOSIS — G894 Chronic pain syndrome: Secondary | ICD-10-CM

## 2021-02-08 NOTE — Telephone Encounter (Signed)
Patient requests prescription for Oxycodone/Acetaminphen 7.5-325 mgs.  Qty  42  Sig: TAKE ONE TABLET BY MOUTH EVERY 4 HOURS AS NEEDED FOR PAIN  Patient states she uses [name] in [place]

## 2021-02-09 MED ORDER — OXYCODONE-ACETAMINOPHEN 7.5-325 MG PO TABS: ORAL_TABLET | ORAL | 0 refills | Status: DC

## 2021-02-15 ENCOUNTER — Other Ambulatory Visit: Payer: Self-pay | Admitting: Orthopedic Surgery

## 2021-02-15 DIAGNOSIS — G894 Chronic pain syndrome: Secondary | ICD-10-CM

## 2021-02-15 NOTE — Telephone Encounter (Signed)
Patient requests refill of oxyCODONE-acetaminophen (PERCOCET) 7.5-325 MG tablet 42 tablet  Pharmacy: [name], [place]   *note-last seen by Dr Romeo Apple 08/09/20. Please advise if an appointment is needed prior to next scheduled visit 08/08/2021.

## 2021-02-16 MED ORDER — OXYCODONE-ACETAMINOPHEN 7.5-325 MG PO TABS
ORAL_TABLET | ORAL | 0 refills | Status: DC
Start: 2021-02-16 — End: 2021-02-22

## 2021-02-22 ENCOUNTER — Other Ambulatory Visit: Payer: Self-pay | Admitting: Orthopedic Surgery

## 2021-02-22 DIAGNOSIS — G894 Chronic pain syndrome: Secondary | ICD-10-CM

## 2021-02-22 MED ORDER — OXYCODONE-ACETAMINOPHEN 7.5-325 MG PO TABS
ORAL_TABLET | ORAL | 0 refills | Status: DC
Start: 2021-02-22 — End: 2021-03-08

## 2021-02-22 NOTE — Telephone Encounter (Signed)
Patient requests refill of oxyCODONE-acetaminophen (PERCOCET) 7.5-325 MG tablet 42 tablet  Pharmacy: [name], [place] 

## 2021-03-01 ENCOUNTER — Other Ambulatory Visit: Payer: Self-pay | Admitting: Orthopedic Surgery

## 2021-03-01 DIAGNOSIS — G894 Chronic pain syndrome: Secondary | ICD-10-CM

## 2021-03-01 MED ORDER — OXYCODONE-ACETAMINOPHEN 7.5-325 MG PO TABS: ORAL_TABLET | ORAL | 0 refills | Status: DC

## 2021-03-01 NOTE — Telephone Encounter (Signed)
Patient requests refill of oxyCODONE-acetaminophen (PERCOCET) 7.5-325 MG tablet 42 tablet  Pharmacy: [name], [place]

## 2021-03-08 ENCOUNTER — Other Ambulatory Visit: Payer: Self-pay | Admitting: Orthopedic Surgery

## 2021-03-08 DIAGNOSIS — G894 Chronic pain syndrome: Secondary | ICD-10-CM

## 2021-03-08 NOTE — Telephone Encounter (Signed)
Patient requests Oxycodone/Acetaminophen 7.5-325 mgs.  Qty  42  Sig: One q 4 hrs prn pain  Patient states she uses [facility]

## 2021-03-09 MED ORDER — OXYCODONE-ACETAMINOPHEN 7.5-325 MG PO TABS: ORAL_TABLET | ORAL | 0 refills | Status: DC

## 2021-03-15 ENCOUNTER — Other Ambulatory Visit: Payer: Self-pay | Admitting: Orthopedic Surgery

## 2021-03-15 DIAGNOSIS — G894 Chronic pain syndrome: Secondary | ICD-10-CM

## 2021-03-15 NOTE — Telephone Encounter (Signed)
Patient requests refill: °oxyCODONE-acetaminophen (PERCOCET) 7.5-325 MG tablet 42 tablet  °     [name], [place] °

## 2021-03-16 MED ORDER — OXYCODONE-ACETAMINOPHEN 7.5-325 MG PO TABS
ORAL_TABLET | ORAL | 0 refills | Status: DC
Start: 2021-03-16 — End: 2021-03-24

## 2021-03-22 ENCOUNTER — Other Ambulatory Visit: Payer: Self-pay | Admitting: Orthopedic Surgery

## 2021-03-22 DIAGNOSIS — G894 Chronic pain syndrome: Secondary | ICD-10-CM

## 2021-03-22 NOTE — Telephone Encounter (Signed)
Patient requests refill: °oxyCODONE-acetaminophen (PERCOCET) 7.5-325 MG tablet 42 tablet  °     [name], [place] °

## 2021-03-24 ENCOUNTER — Other Ambulatory Visit: Payer: Self-pay | Admitting: Orthopedic Surgery

## 2021-03-24 DIAGNOSIS — K5900 Constipation, unspecified: Secondary | ICD-10-CM | POA: Diagnosis not present

## 2021-03-24 DIAGNOSIS — R5383 Other fatigue: Secondary | ICD-10-CM | POA: Diagnosis not present

## 2021-03-24 DIAGNOSIS — I10 Essential (primary) hypertension: Secondary | ICD-10-CM | POA: Diagnosis not present

## 2021-03-24 DIAGNOSIS — R42 Dizziness and giddiness: Secondary | ICD-10-CM | POA: Diagnosis not present

## 2021-03-24 DIAGNOSIS — M5431 Sciatica, right side: Secondary | ICD-10-CM | POA: Diagnosis not present

## 2021-03-24 DIAGNOSIS — H8110 Benign paroxysmal vertigo, unspecified ear: Secondary | ICD-10-CM | POA: Diagnosis not present

## 2021-03-24 DIAGNOSIS — G894 Chronic pain syndrome: Secondary | ICD-10-CM

## 2021-03-24 DIAGNOSIS — E039 Hypothyroidism, unspecified: Secondary | ICD-10-CM | POA: Diagnosis not present

## 2021-03-24 DIAGNOSIS — G8929 Other chronic pain: Secondary | ICD-10-CM | POA: Diagnosis not present

## 2021-03-24 MED ORDER — OXYCODONE-ACETAMINOPHEN 7.5-325 MG PO TABS
ORAL_TABLET | ORAL | 0 refills | Status: DC
Start: 2021-03-24 — End: 2021-03-30

## 2021-03-24 NOTE — Progress Notes (Signed)
Meds ordered this encounter  Medications   oxyCODONE-acetaminophen (PERCOCET) 7.5-325 MG tablet    Sig: One q 4 hrs prn pain    Dispense:  42 tablet    Refill:  0

## 2021-03-25 ENCOUNTER — Other Ambulatory Visit (HOSPITAL_COMMUNITY): Payer: Self-pay | Admitting: Psychiatry

## 2021-03-25 DIAGNOSIS — F3130 Bipolar disorder, current episode depressed, mild or moderate severity, unspecified: Secondary | ICD-10-CM

## 2021-03-29 ENCOUNTER — Other Ambulatory Visit: Payer: Self-pay | Admitting: Internal Medicine

## 2021-03-29 DIAGNOSIS — Z139 Encounter for screening, unspecified: Secondary | ICD-10-CM

## 2021-03-30 ENCOUNTER — Telehealth: Payer: Self-pay | Admitting: Orthopedic Surgery

## 2021-03-30 DIAGNOSIS — G894 Chronic pain syndrome: Secondary | ICD-10-CM

## 2021-03-30 NOTE — Telephone Encounter (Signed)
  Dr Mort Sawyers patient requests refill: oxyCODONE-acetaminophen (PERCOCET) 7.5-325 MG tablet 42 tablet    [name], [place]

## 2021-03-31 ENCOUNTER — Other Ambulatory Visit: Payer: Self-pay

## 2021-03-31 ENCOUNTER — Ambulatory Visit
Admission: RE | Admit: 2021-03-31 | Discharge: 2021-03-31 | Disposition: A | Discharge status: Home / Self Care | Payer: Medicare Other | Source: Ambulatory Visit | Attending: Internal Medicine | Admitting: Internal Medicine

## 2021-03-31 DIAGNOSIS — Z139 Encounter for screening, unspecified: Secondary | ICD-10-CM

## 2021-03-31 DIAGNOSIS — Z1231 Encounter for screening mammogram for malignant neoplasm of breast: Secondary | ICD-10-CM | POA: Diagnosis not present

## 2021-03-31 MED ORDER — OXYCODONE-ACETAMINOPHEN 7.5-325 MG PO TABS
ORAL_TABLET | ORAL | 0 refills | Status: DC
Start: 2021-03-31 — End: 2021-04-11

## 2021-04-04 ENCOUNTER — Other Ambulatory Visit (HOSPITAL_COMMUNITY): Payer: Self-pay | Admitting: Psychiatry

## 2021-04-04 DIAGNOSIS — F5105 Insomnia due to other mental disorder: Secondary | ICD-10-CM

## 2021-04-05 ENCOUNTER — Other Ambulatory Visit: Payer: Self-pay | Admitting: Gastroenterology

## 2021-04-08 ENCOUNTER — Other Ambulatory Visit: Payer: Self-pay | Admitting: Orthopedic Surgery

## 2021-04-08 DIAGNOSIS — G894 Chronic pain syndrome: Secondary | ICD-10-CM

## 2021-04-08 NOTE — Telephone Encounter (Signed)
oxyCODONE-acetaminophen (PERCOCET) 7.5-325 MG tablet 42 tablet    [name], [place]

## 2021-04-11 MED ORDER — OXYCODONE-ACETAMINOPHEN 7.5-325 MG PO TABS
ORAL_TABLET | ORAL | 0 refills | Status: DC
Start: 2021-04-11 — End: 2021-04-18

## 2021-04-18 ENCOUNTER — Other Ambulatory Visit: Payer: Self-pay | Admitting: Orthopedic Surgery

## 2021-04-18 DIAGNOSIS — E063 Autoimmune thyroiditis: Secondary | ICD-10-CM | POA: Diagnosis not present

## 2021-04-18 DIAGNOSIS — E039 Hypothyroidism, unspecified: Secondary | ICD-10-CM | POA: Diagnosis not present

## 2021-04-18 DIAGNOSIS — I10 Essential (primary) hypertension: Secondary | ICD-10-CM | POA: Diagnosis not present

## 2021-04-18 DIAGNOSIS — R55 Syncope and collapse: Secondary | ICD-10-CM | POA: Diagnosis not present

## 2021-04-18 DIAGNOSIS — E559 Vitamin D deficiency, unspecified: Secondary | ICD-10-CM | POA: Diagnosis not present

## 2021-04-18 DIAGNOSIS — T50905A Adverse effect of unspecified drugs, medicaments and biological substances, initial encounter: Secondary | ICD-10-CM | POA: Diagnosis not present

## 2021-04-18 DIAGNOSIS — E538 Deficiency of other specified B group vitamins: Secondary | ICD-10-CM | POA: Diagnosis not present

## 2021-04-18 DIAGNOSIS — G894 Chronic pain syndrome: Secondary | ICD-10-CM

## 2021-04-18 DIAGNOSIS — N3281 Overactive bladder: Secondary | ICD-10-CM | POA: Diagnosis not present

## 2021-04-18 DIAGNOSIS — R42 Dizziness and giddiness: Secondary | ICD-10-CM | POA: Diagnosis not present

## 2021-04-18 MED ORDER — OXYCODONE-ACETAMINOPHEN 7.5-325 MG PO TABS
ORAL_TABLET | ORAL | 0 refills | Status: DC
Start: 2021-04-18 — End: 2021-04-25

## 2021-04-18 NOTE — Telephone Encounter (Signed)
Patient called for refill:  oxyCODONE-acetaminophen (PERCOCET) 7.5-325 MG tablet 42 tablet     [name], [place], [place]

## 2021-04-20 ENCOUNTER — Encounter: Payer: Self-pay | Admitting: Internal Medicine

## 2021-04-25 ENCOUNTER — Other Ambulatory Visit: Payer: Self-pay | Admitting: Orthopedic Surgery

## 2021-04-25 DIAGNOSIS — G894 Chronic pain syndrome: Secondary | ICD-10-CM

## 2021-04-25 MED ORDER — OXYCODONE-ACETAMINOPHEN 7.5-325 MG PO TABS: ORAL_TABLET | ORAL | 0 refills | Status: DC

## 2021-04-25 NOTE — Telephone Encounter (Signed)
Patient called for refill:  oxyCODONE-acetaminophen (PERCOCET) 7.5-325 MG tablet 42 tablet     [name], [place], [place] 

## 2021-05-02 ENCOUNTER — Other Ambulatory Visit: Payer: Self-pay | Admitting: Orthopedic Surgery

## 2021-05-02 DIAGNOSIS — G894 Chronic pain syndrome: Secondary | ICD-10-CM

## 2021-05-02 MED ORDER — OXYCODONE-ACETAMINOPHEN 7.5-325 MG PO TABS
ORAL_TABLET | ORAL | 0 refills | Status: DC
Start: 2021-05-02 — End: 2021-05-09

## 2021-05-02 NOTE — Telephone Encounter (Signed)
Patient requests refill: °oxyCODONE-acetaminophen (PERCOCET) 7.5-325 MG tablet 42 tablet  °     [name], [place] °

## 2021-05-09 ENCOUNTER — Other Ambulatory Visit: Payer: Self-pay | Admitting: Orthopedic Surgery

## 2021-05-09 DIAGNOSIS — G894 Chronic pain syndrome: Secondary | ICD-10-CM

## 2021-05-09 MED ORDER — OXYCODONE-ACETAMINOPHEN 7.5-325 MG PO TABS: ORAL_TABLET | ORAL | 0 refills | Status: DC

## 2021-05-09 NOTE — Telephone Encounter (Signed)
Patient requests refill: oxyCODONE-acetaminophen (PERCOCET) 7.5-325 MG tablet 42 tablet   [name]

## 2021-05-13 ENCOUNTER — Ambulatory Visit: Payer: Medicare Other | Admitting: Gastroenterology

## 2021-05-17 ENCOUNTER — Other Ambulatory Visit: Payer: Self-pay | Admitting: Orthopedic Surgery

## 2021-05-17 DIAGNOSIS — G894 Chronic pain syndrome: Secondary | ICD-10-CM

## 2021-05-17 MED ORDER — OXYCODONE-ACETAMINOPHEN 7.5-325 MG PO TABS: ORAL_TABLET | ORAL | 0 refills | Status: DC

## 2021-05-17 NOTE — Telephone Encounter (Signed)
Patient requests refill: oxyCODONE-acetaminophen (PERCOCET) 7.5-325 MG tablet 42 tablet   [name]  

## 2021-05-24 ENCOUNTER — Other Ambulatory Visit: Payer: Self-pay | Admitting: Radiology

## 2021-05-24 DIAGNOSIS — G894 Chronic pain syndrome: Secondary | ICD-10-CM

## 2021-05-24 MED ORDER — OXYCODONE-ACETAMINOPHEN 7.5-325 MG PO TABS
ORAL_TABLET | ORAL | 0 refills | Status: DC
Start: 2021-05-24 — End: 2021-05-31

## 2021-05-24 NOTE — Telephone Encounter (Signed)
Patient called, requested refill.

## 2021-05-31 ENCOUNTER — Other Ambulatory Visit: Payer: Self-pay | Admitting: Orthopedic Surgery

## 2021-05-31 DIAGNOSIS — G894 Chronic pain syndrome: Secondary | ICD-10-CM

## 2021-05-31 NOTE — Telephone Encounter (Signed)
Patient requests refill: oxyCODONE-acetaminophen (PERCOCET) 7.5-325 MG tablet 42 tablet      [name]

## 2021-06-01 MED ORDER — OXYCODONE-ACETAMINOPHEN 7.5-325 MG PO TABS
ORAL_TABLET | ORAL | 0 refills | Status: DC
Start: 2021-06-01 — End: 2021-06-08

## 2021-06-08 ENCOUNTER — Other Ambulatory Visit: Payer: Self-pay | Admitting: Orthopedic Surgery

## 2021-06-08 DIAGNOSIS — G894 Chronic pain syndrome: Secondary | ICD-10-CM

## 2021-06-08 MED ORDER — OXYCODONE-ACETAMINOPHEN 7.5-325 MG PO TABS: ORAL_TABLET | ORAL | 0 refills | Status: DC

## 2021-06-08 NOTE — Telephone Encounter (Signed)
Patient called requesting refill on her pain medicine  oxyCODONE-acetaminophen (PERCOCET) 7.5-325 MG tablet  Pharmacy:  [facility]

## 2021-06-15 ENCOUNTER — Other Ambulatory Visit: Payer: Self-pay | Admitting: Radiology

## 2021-06-15 DIAGNOSIS — G894 Chronic pain syndrome: Secondary | ICD-10-CM

## 2021-06-15 MED ORDER — OXYCODONE-ACETAMINOPHEN 7.5-325 MG PO TABS: ORAL_TABLET | ORAL | 0 refills | Status: DC

## 2021-06-15 NOTE — Telephone Encounter (Signed)
Patient called, requested refill Oxycodone.   [name].

## 2021-06-22 ENCOUNTER — Other Ambulatory Visit: Payer: Self-pay | Admitting: Orthopedic Surgery

## 2021-06-22 DIAGNOSIS — G894 Chronic pain syndrome: Secondary | ICD-10-CM

## 2021-06-22 NOTE — Telephone Encounter (Signed)
Patient called for refill: oxyCODONE-acetaminophen (PERCOCET) 7.5-325 MG tablet 42 tablet       [name]

## 2021-06-23 MED ORDER — OXYCODONE-ACETAMINOPHEN 7.5-325 MG PO TABS: ORAL_TABLET | ORAL | 0 refills | Status: DC

## 2021-06-23 NOTE — Telephone Encounter (Signed)
Patient is calling again today about her pain medicine being refilled   oxyCODONE-acetaminophen (PERCOCET) 7.5-325 MG tablet  Pharmacy [facility] in [place]

## 2021-06-28 ENCOUNTER — Other Ambulatory Visit: Payer: Self-pay | Admitting: Gastroenterology

## 2021-06-28 ENCOUNTER — Other Ambulatory Visit (HOSPITAL_COMMUNITY): Payer: Self-pay | Admitting: Psychiatry

## 2021-06-28 DIAGNOSIS — F3130 Bipolar disorder, current episode depressed, mild or moderate severity, unspecified: Secondary | ICD-10-CM

## 2021-06-28 DIAGNOSIS — K219 Gastro-esophageal reflux disease without esophagitis: Secondary | ICD-10-CM

## 2021-06-28 NOTE — Telephone Encounter (Signed)
Call for appt

## 2021-06-29 ENCOUNTER — Other Ambulatory Visit (HOSPITAL_COMMUNITY): Payer: Self-pay | Admitting: Psychiatry

## 2021-06-29 DIAGNOSIS — F5105 Insomnia due to other mental disorder: Secondary | ICD-10-CM

## 2021-06-30 ENCOUNTER — Other Ambulatory Visit: Payer: Self-pay | Admitting: Radiology

## 2021-06-30 DIAGNOSIS — G894 Chronic pain syndrome: Secondary | ICD-10-CM

## 2021-06-30 MED ORDER — OXYCODONE-ACETAMINOPHEN 7.5-325 MG PO TABS: ORAL_TABLET | ORAL | 0 refills | Status: DC

## 2021-06-30 NOTE — Telephone Encounter (Signed)
Patient called, asked for refill oxycodone.   [name].

## 2021-07-13 ENCOUNTER — Other Ambulatory Visit: Payer: Self-pay

## 2021-07-13 DIAGNOSIS — G894 Chronic pain syndrome: Secondary | ICD-10-CM

## 2021-07-13 MED ORDER — OXYCODONE-ACETAMINOPHEN 7.5-325 MG PO TABS: ORAL_TABLET | ORAL | 0 refills | Status: DC

## 2021-07-13 NOTE — Telephone Encounter (Signed)
Oxycodone-Acetaminophen  7.5/325 mg  Qty 42 Tablets  One q 4 hrs prn pain   PATIENT USES [name] IN [place]  

## 2021-07-20 ENCOUNTER — Other Ambulatory Visit: Payer: Self-pay | Admitting: Orthopedic Surgery

## 2021-07-20 DIAGNOSIS — G894 Chronic pain syndrome: Secondary | ICD-10-CM

## 2021-07-20 MED ORDER — OXYCODONE-ACETAMINOPHEN 7.5-325 MG PO TABS: ORAL_TABLET | ORAL | 0 refills | Status: DC

## 2021-07-20 NOTE — Telephone Encounter (Signed)
Patient requests refill: oxyCODONE-acetaminophen (PERCOCET) 7.5-325 MG tablet 42 tablet      [name]

## 2021-07-27 ENCOUNTER — Other Ambulatory Visit: Payer: Self-pay

## 2021-07-27 DIAGNOSIS — G894 Chronic pain syndrome: Secondary | ICD-10-CM

## 2021-07-27 MED ORDER — OXYCODONE-ACETAMINOPHEN 7.5-325 MG PO TABS: ORAL_TABLET | ORAL | 0 refills | Status: DC

## 2021-07-27 NOTE — Telephone Encounter (Signed)
Oxycodone-Acetaminophen  7.5/325 mg  Qty 42 Tablets  One q 4 hrs prn pain   PATIENT USES [name] IN [place]

## 2021-07-28 ENCOUNTER — Other Ambulatory Visit: Payer: Self-pay | Admitting: Gastroenterology

## 2021-07-28 ENCOUNTER — Other Ambulatory Visit: Payer: Self-pay | Admitting: Orthopedic Surgery

## 2021-07-28 DIAGNOSIS — M1711 Unilateral primary osteoarthritis, right knee: Secondary | ICD-10-CM

## 2021-07-28 DIAGNOSIS — G894 Chronic pain syndrome: Secondary | ICD-10-CM

## 2021-08-08 ENCOUNTER — Encounter: Payer: Self-pay | Admitting: Orthopedic Surgery

## 2021-08-08 ENCOUNTER — Ambulatory Visit (INDEPENDENT_AMBULATORY_CARE_PROVIDER_SITE_OTHER): Payer: Medicare Other | Admitting: Orthopedic Surgery

## 2021-08-08 ENCOUNTER — Other Ambulatory Visit: Payer: Self-pay

## 2021-08-08 ENCOUNTER — Other Ambulatory Visit: Payer: Self-pay | Admitting: Orthopedic Surgery

## 2021-08-08 ENCOUNTER — Ambulatory Visit: Payer: Medicare Other

## 2021-08-08 DIAGNOSIS — Z96651 Presence of right artificial knee joint: Secondary | ICD-10-CM

## 2021-08-08 DIAGNOSIS — G894 Chronic pain syndrome: Secondary | ICD-10-CM

## 2021-08-08 DIAGNOSIS — M1710 Unilateral primary osteoarthritis, unspecified knee: Secondary | ICD-10-CM

## 2021-08-08 MED ORDER — OXYCODONE-ACETAMINOPHEN 7.5-325 MG PO TABS: ORAL_TABLET | ORAL | 0 refills | Status: DC

## 2021-08-08 NOTE — Patient Instructions (Signed)
The number to [facility] is [phone]

## 2021-08-08 NOTE — Progress Notes (Signed)
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  The patient had a patella revision for subluxation and tilt  She was never able to reach pain-free status and has been treated for chronic pain with oxycodone 7.5 mg since close to that time  She also complains of bilateral radiculopathy from her hips to her toes and feet with burning numbness and tingling  The range of motion in her right knee 0 to 115 degrees and is stable she had some mild quad weakness  Her x-ray shows no loosening it does show the 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

## 2021-08-15 ENCOUNTER — Other Ambulatory Visit: Payer: Self-pay

## 2021-08-15 DIAGNOSIS — G894 Chronic pain syndrome: Secondary | ICD-10-CM

## 2021-08-15 MED ORDER — OXYCODONE-ACETAMINOPHEN 7.5-325 MG PO TABS: ORAL_TABLET | ORAL | 0 refills | Status: DC

## 2021-08-15 NOTE — Telephone Encounter (Signed)
Requesting refill for Oxycodone-Acetaminophen  7.5/325 mg  PATIENT USES [name] IN [place]

## 2021-08-17 ENCOUNTER — Encounter: Payer: Self-pay | Admitting: Internal Medicine

## 2021-08-17 ENCOUNTER — Ambulatory Visit: Payer: Medicare Other | Admitting: Gastroenterology

## 2021-08-29 ENCOUNTER — Other Ambulatory Visit: Payer: Self-pay | Admitting: Orthopedic Surgery

## 2021-08-29 DIAGNOSIS — G894 Chronic pain syndrome: Secondary | ICD-10-CM

## 2021-08-29 MED ORDER — OXYCODONE-ACETAMINOPHEN 7.5-325 MG PO TABS: ORAL_TABLET | ORAL | 0 refills | Status: DC

## 2021-08-29 NOTE — Telephone Encounter (Signed)
Patient requests refill: °oxyCODONE-acetaminophen (PERCOCET) 7.5-325 MG tablet 42 tablet  °     [name], [place] °

## 2021-09-06 ENCOUNTER — Emergency Department (HOSPITAL_COMMUNITY)
Admission: EM | Admit: 2021-09-06 | Discharge: 2021-09-06 | Disposition: A | Discharge status: Home / Self Care | Payer: Medicare Other | Attending: Emergency Medicine | Admitting: Emergency Medicine

## 2021-09-06 ENCOUNTER — Other Ambulatory Visit: Payer: Self-pay | Admitting: Orthopedic Surgery

## 2021-09-06 ENCOUNTER — Encounter (HOSPITAL_COMMUNITY): Payer: Self-pay

## 2021-09-06 ENCOUNTER — Other Ambulatory Visit: Payer: Self-pay

## 2021-09-06 ENCOUNTER — Emergency Department (HOSPITAL_COMMUNITY): Payer: Medicare Other

## 2021-09-06 DIAGNOSIS — S99921A Unspecified injury of right foot, initial encounter: Secondary | ICD-10-CM | POA: Diagnosis present

## 2021-09-06 DIAGNOSIS — R079 Chest pain, unspecified: Secondary | ICD-10-CM | POA: Diagnosis not present

## 2021-09-06 DIAGNOSIS — I10 Essential (primary) hypertension: Secondary | ICD-10-CM | POA: Diagnosis not present

## 2021-09-06 DIAGNOSIS — Z043 Encounter for examination and observation following other accident: Secondary | ICD-10-CM | POA: Diagnosis not present

## 2021-09-06 DIAGNOSIS — Z79899 Other long term (current) drug therapy: Secondary | ICD-10-CM | POA: Insufficient documentation

## 2021-09-06 DIAGNOSIS — Z87891 Personal history of nicotine dependence: Secondary | ICD-10-CM | POA: Insufficient documentation

## 2021-09-06 DIAGNOSIS — E039 Hypothyroidism, unspecified: Secondary | ICD-10-CM | POA: Diagnosis not present

## 2021-09-06 DIAGNOSIS — M25561 Pain in right knee: Secondary | ICD-10-CM | POA: Diagnosis not present

## 2021-09-06 DIAGNOSIS — S93601A Unspecified sprain of right foot, initial encounter: Secondary | ICD-10-CM | POA: Diagnosis not present

## 2021-09-06 DIAGNOSIS — W19XXXA Unspecified fall, initial encounter: Secondary | ICD-10-CM | POA: Insufficient documentation

## 2021-09-06 DIAGNOSIS — S301XXA Contusion of abdominal wall, initial encounter: Secondary | ICD-10-CM | POA: Diagnosis not present

## 2021-09-06 DIAGNOSIS — R109 Unspecified abdominal pain: Secondary | ICD-10-CM | POA: Diagnosis not present

## 2021-09-06 DIAGNOSIS — G894 Chronic pain syndrome: Secondary | ICD-10-CM

## 2021-09-06 LAB — CBC
HCT: 37.6 % (ref 36.0–46.0)
Hemoglobin: 11.8 g/dL — ABNORMAL LOW (ref 12.0–15.0)
MCH: 28 pg (ref 26.0–34.0)
MCHC: 31.4 g/dL (ref 30.0–36.0)
MCV: 89.3 fL (ref 80.0–100.0)
Platelets: 182 10*3/uL (ref 150–400)
RBC: 4.21 MIL/uL (ref 3.87–5.11)
RDW: 12.9 % (ref 11.5–15.5)
WBC: 6 10*3/uL (ref 4.0–10.5)
nRBC: 0 % (ref 0.0–0.2)

## 2021-09-06 LAB — BASIC METABOLIC PANEL
Anion gap: 8 (ref 5–15)
BUN: 17 mg/dL (ref 8–23)
CO2: 28 mmol/L (ref 22–32)
Calcium: 8.4 mg/dL — ABNORMAL LOW (ref 8.9–10.3)
Chloride: 98 mmol/L (ref 98–111)
Creatinine, Ser: 0.9 mg/dL (ref 0.44–1.00)
GFR, Estimated: >60 mL/min (ref >60)
Glucose, Bld: 97 mg/dL (ref 70–99)
Potassium: 3.9 mmol/L (ref 3.5–5.1)
Sodium: 134 mmol/L — ABNORMAL LOW (ref 135–145)

## 2021-09-06 MED ORDER — IOHEXOL 300 MG/ML  SOLN
100.0000 mL | Freq: Once | INTRAMUSCULAR | Status: AC | PRN
  Administered 2021-09-06: 16:00:00 100 mL via INTRAVENOUS

## 2021-09-06 MED ORDER — OXYCODONE-ACETAMINOPHEN 5-325 MG PO TABS
1.0000 | ORAL_TABLET | Freq: Once | ORAL | Status: AC
  Administered 2021-09-06: 20:00:00 1 via ORAL
  Filled 2021-09-06: qty 1

## 2021-09-06 MED ORDER — OXYCODONE-ACETAMINOPHEN 7.5-325 MG PO TABS: ORAL_TABLET | ORAL | 0 refills | Status: DC

## 2021-09-06 NOTE — ED Triage Notes (Signed)
Patient with complaints of right lower leg pain after fall the previous night.

## 2021-09-06 NOTE — ED Provider Notes (Signed)
[facility] Provider Note   CSN: [id number] Arrival date & time: 09/06/21  1326     History Chief Complaint  Patient presents with   Leg Pain    Diane [name] is a 70 y.o. female.   Leg Pain Associated symptoms: no fever   Patient Diane [name] after a fall.  Fell last night.  Unsure exactly happened but pain in right knee right foot and left flank.  Pain worse in the left flank and right foot.  Does have a history of chronic pain.  May have been her head.  No loss of consciousness however.  Not on blood thinners.  Has not been able ambulate due to the right foot pain.  No blood in the urine.    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[facility])     Patient Active Problem List   Diagnosis Date Noted   Syncope 02/18/2020   Prolonged QT interval 02/18/2020   Glucose intolerance 02/18/2020   Normocytic anemia 12/10/2019   Poor appetite 09/24/2019   Abnormal weight loss 09/24/2019   Upper abdominal mass 09/24/2019   Dysphagia 01/30/2019   Simple adnexal cyst greater than 1 cm in diameter in postmenopausal patient 11/08/2018   Abdominal pain 08/09/2015   Dysphagia, pharyngoesophageal phase    H/O adenomatous polyp of colon 05/05/2015   Constipation 02/02/2015   Knee contusion 12/09/2013   Esophageal dysphagia 07/02/2013   Abdominal pain, epigastric 07/02/2013   Chronic pain syndrome 11/28/2012   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   Cellulitis 11/04/2012   Postoperative  wound breakdown 11/04/2012   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   Knee pain 08/13/2012   Hypokalemia 08/01/2012   Cellulitis, wound, post-operative 07/30/2012   Degenerative tear of medial meniscus of right knee 01/12/2012   OA (osteoarthritis) of knee 01/12/2012   Right knee sprain 11/02/2011   Instability of knee joint 03/29/2011   URTICARIA 12/21/2009   OTHER URINARY INCONTINENCE 07/18/2009   FOOT PAIN, LEFT 05/26/2009   DERMATITIS, CHRONIC 10/21/2008   IMPAIRED GLUCOSE TOLERANCE 09/09/2008   Chronic back pain 09/09/2008   Hypothyroidism 02/27/2008   Hyperlipidemia 02/27/2008   Anxiety with depression 02/27/2008   Bipolar I disorder, most recent episode (or current) depressed, unspecified 02/27/2008   HYPERTENSION 02/27/2008   ALLERGIC RHINITIS 02/27/2008   GERD 02/27/2008   HEADACHE 02/27/2008    Past Surgical History:  Procedure Laterality Date   BACK SURGERY     BILATERAL OOPHORECTOMY     CATARACT EXTRACTION W/PHACO Left 01/04/2016   Procedure: CATARACT EXTRACTION PHACO AND INTRAOCULAR LENS PLACEMENT ([place]);  Surgeon: Rutherford Guys, MD;  Location: [facility];  Service: Ophthalmology;  Laterality: Left;  CDE:4.70   CATARACT EXTRACTION W/PHACO Right 01/18/2016   Procedure: CATARACT EXTRACTION PHACO AND INTRAOCULAR LENS PLACEMENT RIGHT EYE; CDE:  4.66;  Surgeon: Rutherford Guys, MD;  Location: [facility];  Service: Ophthalmology;  Laterality: Right;   CHOLECYSTECTOMY     COLONOSCOPY  10/16/2008   CXK:[id number], otherwise normal rectum; pancolonic diverticula the remainder of the colonic mucosa appeared normal. Next TCS due 10/2013.   COLONOSCOPY  06/28/2006   RMR: Internal hemorrhoids. Diminutive rectal polyps, cold biopsied/ Pancolonic diverticula. Polyps in the right colon removed    COLONOSCOPY WITH PROPOFOL N/A 05/20/2015   RMR: Pancolonic diverticulosis. Redundant colon    COLONOSCOPY WITH PROPOFOL N/A 11/11/2020   Procedure: COLONOSCOPY WITH PROPOFOL;  Surgeon: Corbin Ade, MD;  Location: [facility];  Service: Endoscopy;  Laterality: N/A;  pm ASA 2   ESOPHAGOGASTRODUODENOSCOPY  10/16/2008   WYO:VZCHY hiatal hernia otherwise normal esophagus, stomach  D1, D2, status post passage of a 56-French Maloney dilator   ESOPHAGOGASTRODUODENOSCOPY  06/28/2006   IFO:YDXAJO esophagus. Small hiatal hernia. Otherwise normal stomach, D1 and D2, status post passage of a 56 French Maloney dilator   ESOPHAGOGASTRODUODENOSCOPY (EGD) WITH ESOPHAGEAL DILATION N/A 07/14/2013   RMR: Abnormal esophagus of uncertain significance-status post esophageal biospy. small hiatal hernia.    ESOPHAGOGASTRODUODENOSCOPY (EGD) WITH PROPOFOL N/A 05/20/2015   RMR: NORMAL EGD status post maloney dilation    ESOPHAGOGASTRODUODENOSCOPY (EGD) WITH PROPOFOL N/A 12/23/2018   Dr. Jena Gauss: normal esophagus, s/p esophageal dilation.    FOOT SURGERY     left foot-   INCISION AND DRAINAGE Right 01/03/2013   Procedure: INCISION AND DRAINAGE;  Surgeon: Vickki Hearing, MD;  Location: [facility];  Service: Orthopedics;  Laterality: Right;   KNEE ARTHROSCOPY  01/2012   right   KNEE ARTHROSCOPY WITH LATERAL RELEASE Right 01/03/2013   Procedure: Lateral Release Patella Right Knee;  Surgeon: Vickki Hearing, MD;  Location: [facility];  Service: Orthopedics;  Laterality: Right;   LAPAROSCOPIC LYSIS OF ADHESIONS N/A 11/12/2019   Procedure: LAPAROSCOPIC LYSIS OF ADHESIONS, extensive; drainage of peritoneal cyst;  Surgeon: Lazaro Arms, MD;  Location: [facility];  Service: Gynecology;  Laterality: N/A;   LUMBAR DISC SURGERY  L4-5   MALONEY DILATION N/A 05/20/2015   Procedure: Elease Hashimoto DILATION;  Surgeon: Corbin Ade, MD;  Location: [facility];  Service: Endoscopy;  Laterality: N/AElease Hashimoto dilator # [id number]   MALONEY DILATION N/A 12/23/2018   Procedure: Elease Hashimoto DILATION;  Surgeon: Corbin Ade, MD;  [facility];  Service: Endoscopy;  Laterality: N/A;   PARTIAL HYSTERECTOMY     TOTAL KNEE ARTHROPLASTY  07/01/2012    Procedure: TOTAL KNEE ARTHROPLASTY;  Surgeon: Vickki Hearing, MD;  Location: [facility];  Service: Orthopedics;  Laterality: Right;   TOTAL KNEE REVISION Right 01/03/2013   Procedure: Patellaplasty Right Knee;  Surgeon: Vickki Hearing, MD;  Location: [facility];  Service: Orthopedics;  Laterality: Right;     OB History     Gravida  2   Para  2   Term  2   Preterm      AB      Living  2      SAB      IAB      Ectopic      Multiple      Live Births              Family History  Problem Relation Age of Onset   Alcohol abuse Mother    Anxiety disorder Mother    Alcohol abuse Father    Anxiety disorder Father    Breast cancer Sister    Bipolar disorder Sister  Breast cancer Sister    Dementia Paternal Aunt    Alcohol abuse Brother    Anxiety disorder Brother    Drug abuse Brother    Drug abuse Brother    Alcohol abuse Brother    Anxiety disorder Brother    Seizures Brother    Alcohol abuse Brother    Anxiety disorder Brother    Seizures Brother    Alcohol abuse Brother    Anxiety disorder Brother    Alcohol abuse Brother    Anxiety disorder Brother    ADD / ADHD Grandchild    ADD / ADHD Grandchild    Heart disease Other    Diabetes Other    Alcohol abuse Other    Depression Neg Hx    OCD Neg Hx    Paranoid behavior Neg Hx    Schizophrenia Neg Hx    Sexual abuse Neg Hx    Physical abuse Neg Hx    Colon cancer Neg Hx     Social History   Tobacco Use   Smoking status: Former    Packs/day: 0.25    Years: 5.00    Pack years: 1.25    Types: Cigarettes    Quit date: 06/26/2003    Years since quitting: 18.2   Smokeless tobacco: Never  Vaping Use   Vaping Use: Never used  Substance Use Topics   Alcohol use: Yes    Alcohol/week: 0.0 standard drinks    Comment: occasionally; once a month.   Drug use: Not Currently    Comment: has a past history of street drug use    Home Medications Prior to Admission medications   Medication Sig Start  Date End Date Taking? Authorizing Provider  ALPRAZolam Duanne Moron) 0.5 MG tablet TAKE ONE TABLET BY MOUTH THREE TIMES DAILY 06/29/21   Cloria Spring, MD  ascorbic acid (VITAMIN C) 500 MG tablet Take 500 mg by mouth daily.    [provider]  atorvastatin (LIPITOR) 20 MG tablet Take 20 mg by mouth daily.    [provider]  cyclobenzaprine (FLEXERIL) 10 MG tablet TAKE ONE TABLET BY MOUTH THREE TIMES DAILY AS NEEDED FOR MUSCLE SPASMS 07/28/21   Carole Civil, MD  divalproex (DEPAKOTE) 125 MG DR tablet TAKE THREE (3) TABLETS BY MOUTH AT BEDTIME 06/28/21   Cloria Spring, MD  ferrous sulfate 325 (65 FE) MG tablet Take 325 mg by mouth daily with breakfast.    [provider]  FLUoxetine (PROZAC) 20 MG capsule TAKE ONE CAPSULE BY MOUTH TWICE DAILY 06/28/21   Cloria Spring, MD  hydrocortisone (ANUSOL-HC) 2.5 % rectal cream Place 1 application rectally 2 (two) times daily. Patient taking differently: Place 1 application rectally as needed. 02/06/18   Annitta Needs, NP  levothyroxine (SYNTHROID) 125 MCG tablet Take 125 mcg by mouth daily before breakfast. 03/29/20   [provider]  lisinopril (PRINIVIL,ZESTRIL) 10 MG tablet Take 10 mg by mouth at bedtime.     [provider]  lubiprostone (AMITIZA) 24 MCG capsule TAKE ONE CAPSULE BY MOUTH TWICE DAILY WITH A MEAL 07/28/21   Mahala Menghini, PA-C  oxyCODONE-acetaminophen (PERCOCET) 7.5-325 MG tablet One q 4 hrs prn pain 09/06/21   Carole Civil, MD  pantoprazole (PROTONIX) 40 MG tablet TAKE ONE TABLET BY MOUTH TWICE DAILY 06/30/21   Annitta Needs, NP  SYMPROIC 0.2 MG TABS TAKE ONE TABLET BY MOUTH DAILY 06/30/21   Annitta Needs, NP  traZODone (DESYREL) 100 MG tablet  TAKE ONE TABLET BY MOUTH AT BEDTIME 06/28/21   Cloria Spring, MD    Allergies    Neomycin-bacitracin zn-polymyx, Penicillins, and Sulfonamide derivatives  Review of Systems   Review of Systems  Constitutional:  Negative for appetite  change and fever.  HENT:  Negative for congestion.   Respiratory:  Negative for shortness of breath.   Cardiovascular:  Negative for chest pain.  Gastrointestinal:  Negative for abdominal pain.  Genitourinary:  Positive for flank pain.  Musculoskeletal:        Right foot right knee left flank pain.  Neurological:  Negative for weakness and numbness.  Psychiatric/Behavioral:  Negative for confusion.    Physical Exam Updated Vital Signs BP (!) 157/108 (BP Location: Right Arm)    Pulse 86    Temp 98.6 F (37 C) (Oral)    Resp 18    Ht 5\' 3"  (1.6 m)    Wt 91.6 kg    SpO2 95%    BMI 35.78 kg/m   Physical Exam Vitals and nursing note reviewed.  HENT:     Head: Normocephalic and atraumatic.  Cardiovascular:     Rate and Rhythm: Regular rhythm.  Abdominal:     Tenderness: There is abdominal tenderness.     Comments: Left upper quadrant and left flank tenderness.  Moderate in left flank.  No ecchymosis.  Musculoskeletal:        General: Tenderness present.     Cervical back: Neck supple. No tenderness.     Comments: Tenderness and swelling in right foot.  No tenderness over ankle.  Some tenderness over knee.  No tenderness over either hip.  No left lower extremity tenderness.  No upper extremity tenderness.  No real lumbar spine tenderness more in the left flank area.  Skin:    Capillary Refill: Capillary refill takes less than 2 seconds.  Neurological:     Mental Status: She is alert and oriented to person, place, and time.    ED Results / Procedures / Treatments   Labs (all labs ordered are listed, but only abnormal results are displayed) Labs Reviewed  CBC - Abnormal; Notable for the following components:      Result Value   Hemoglobin 11.8 (*)    All other components within normal limits  BASIC METABOLIC PANEL - Abnormal; Notable for the following components:   Sodium 134 (*)    Calcium 8.4 (*)    All other components within normal limits    EKG None  Radiology DG  Chest 2 View  Result Date: 09/06/2021 CLINICAL DATA:  Pain post fall EXAM: CHEST - 2 VIEW COMPARISON:  February 18, 2020 FINDINGS: The heart size and mediastinal contours are within normal limits. No focal consolidation. No pleural effusion. No pneumothorax. The visualized skeletal structures are unremarkable. IMPRESSION: No active cardiopulmonary disease. Electronically Signed   By: Dahlia Bailiff M.D.   On: 09/06/2021 17:07   CT ABDOMEN PELVIS W CONTRAST  Result Date: 09/06/2021 CLINICAL DATA:  Left-sided abdominal pain after fall last night. EXAM: CT ABDOMEN AND PELVIS WITH CONTRAST TECHNIQUE: Multidetector CT imaging of the abdomen and pelvis was performed using the standard protocol following bolus administration of intravenous contrast. CONTRAST:  138mL OMNIPAQUE IOHEXOL 300 MG/ML  SOLN COMPARISON:  October 08, 2019. FINDINGS: Lower chest: No acute abnormality. Hepatobiliary: No focal liver abnormality is seen. Status post cholecystectomy. No biliary dilatation. Pancreas: Unremarkable. No pancreatic ductal dilatation or surrounding inflammatory changes. Spleen: Normal in size without focal abnormality. Adrenals/Urinary Tract:  Adrenal glands are unremarkable. Kidneys are normal, without renal calculi, focal lesion, or hydronephrosis. Bladder is unremarkable. Stomach/Bowel: Stomach appears normal. There is no evidence of bowel obstruction or inflammation. Stool is noted throughout the colon. Vascular/Lymphatic: No significant vascular findings are present. No enlarged abdominal or pelvic lymph nodes. Reproductive: Status post hysterectomy. Grossly stable 5.4 cm left adnexal cyst is noted. Other: No abdominal wall hernia or abnormality. No abdominopelvic ascites. Musculoskeletal: No acute or significant osseous findings. IMPRESSION: No acute abnormality seen in the abdomen or pelvis. Grossly stable 5.4 cm left adnexal cyst. Recommend follow-up US in 6-12 months. Note: This recommendation does not apply to  premenarchal patients and to those with increased risk (genetic, family history, elevated tumor markers or other high-risk factors) of ovarian cancer. Reference: JACR 2020 Feb; 17(2):248-254 Electronically Signed   By: Marijo Conception M.D.   On: 09/06/2021 16:29   DG Knee Complete 4 Views Right  Result Date: 09/06/2021 CLINICAL DATA:  Knee pain after fall EXAM: RIGHT KNEE - COMPLETE 4+ VIEW COMPARISON:  August 08, 2021 FINDINGS: Three component right knee arthroplasty without evidence of loosening or periprosthetic fracture. No acute osseous abnormality. No significant joint effusion. IMPRESSION: Three component right knee arthroplasty without evidence of loosening or periprosthetic fracture. Electronically Signed   By: Dahlia Bailiff M.D.   On: 09/06/2021 17:08   DG Foot Complete Right  Result Date: 09/06/2021 CLINICAL DATA:  70 year old female with a history of fall EXAM: RIGHT FOOT COMPLETE - 3+ VIEW COMPARISON:  06/02/2013 FINDINGS: No acute displaced fracture. No subluxation/dislocation. Degenerative changes of the interphalangeal joints and the midfoot. Enthesopathic changes at the Achilles insertion with some fragmentation. Enthesopathic changes at the plantar fascia insertion. No radiopaque foreign body. IMPRESSION: Negative for acute bony abnormality. Degenerative changes of the forefoot, midfoot, hindfoot. Electronically Signed   By: Corrie Mckusick D.O.   On: 09/06/2021 17:05    Procedures Procedures   Medications Ordered in ED Medications  oxyCODONE-acetaminophen (PERCOCET/ROXICET) 5-325 MG per tablet 1 tablet (has no administration in time range)  iohexol (OMNIPAQUE) 300 MG/ML solution 100 mL (100 mLs Intravenous Contrast Given 09/06/21 1606)    ED Course  I have reviewed the triage vital signs and the nursing notes.  Pertinent labs & imaging results that were available during my care of the patient were reviewed by me and considered in my medical decision making (see chart for  details).    MDM Rules/Calculators/A&P                         Patient with fall last night.  Complaining of pain in right foot right knee and left flank.  Imaging done and overall reassuring.  No fracture seen.  CT scan done pelvis and abdomen showed no acute injury.  We will treat with boot to help with the pain.  Follow-up with Dr. Aline Brochure who she is seen previously    Final Clinical Impression(s) / ED Diagnoses Final diagnoses:  Fall, initial encounter  Sprain of right foot, initial encounter  Contusion of flank, initial encounter    Rx / DC Orders ED Discharge Orders     None        Davonna Belling, MD 09/06/21 1927

## 2021-09-06 NOTE — Telephone Encounter (Signed)
Patient requests refill: oxyCODONE-acetaminophen (PERCOCET) 7.5-325 MG tablet 42 tablet       [name], [place]

## 2021-09-06 NOTE — Discharge Instructions (Signed)
Follow-up with Dr. Romeo Apple for the foot sprain and continued pain

## 2021-09-15 ENCOUNTER — Other Ambulatory Visit: Payer: Self-pay

## 2021-09-15 ENCOUNTER — Encounter: Payer: Self-pay | Admitting: Orthopedic Surgery

## 2021-09-15 ENCOUNTER — Ambulatory Visit (INDEPENDENT_AMBULATORY_CARE_PROVIDER_SITE_OTHER): Payer: Medicare Other | Admitting: Orthopedic Surgery

## 2021-09-15 VITALS — BP 142/75 | HR 83 | Ht 63.0 in | Wt 202.0 lb

## 2021-09-15 DIAGNOSIS — G894 Chronic pain syndrome: Secondary | ICD-10-CM | POA: Diagnosis not present

## 2021-09-15 DIAGNOSIS — S93601A Unspecified sprain of right foot, initial encounter: Secondary | ICD-10-CM

## 2021-09-15 DIAGNOSIS — S93491A Sprain of other ligament of right ankle, initial encounter: Secondary | ICD-10-CM

## 2021-09-15 MED ORDER — IBUPROFEN 800 MG PO TABS: 800.0000 mg | ORAL_TABLET | Freq: Three times a day (TID) | ORAL | 1 refills | Status: DC | PRN

## 2021-09-15 MED ORDER — OXYCODONE-ACETAMINOPHEN 7.5-325 MG PO TABS: ORAL_TABLET | ORAL | 0 refills | Status: DC

## 2021-09-15 NOTE — Progress Notes (Signed)
Chief Complaint  Patient presents with   Ankle Pain    Right  / since fall 09/06/21    Foot Pain    Right     History Diane [name] is 71 years old she had a right total knee back in 2013 she had revision of the patellar component and she has had chronic pain since that time.  She is managed with oxycodone 7.5 mg every 4 hours as needed she fell on December 26 presented to the ER on December 27 complaining of right foot and ankle pain and left flank pain  She also has a history of degenerative disc disease.  Her CT scan of her abdomen was negative  She was placed in a short cam walker given a prescription for Percocet and is here for follow-up visit  Problem list medical history and allergies were reviewed nothing new to add  BP (!) 142/75    Pulse 83    Ht 5\' 3"  (1.6 m)    Wt 202 lb (91.6 kg)    BMI 35.78 kg/m   Right foot and ankle are severely swollen Tenderness is palpated over the anterior talofibular ligament as well as the lateral foot proximally around the fifth metatarsal Ankle range of motion is painful but the foot is easily brought to the neutral plantigrade position The drawer test is obscured by the pain and swelling The color and capillary refill are normal Foot compartments are soft  Imaging x-ray right foot and x-ray right ankle My interpretation after reviewing those images is that there is no fracture there is some arthritis in the midfoot  ER record was also reviewed confirming the history as given  Encounter Diagnoses  Name Primary?   Chronic pain syndrome Yes   Sprain of anterior talofibular ligament of right ankle, initial encounter    Sprain of right foot, initial encounter     Recommend 4-week follow-up  In the interim she should take ibuprofen 800 mg 3 times a day I refilled her chronic pain medication which she can take for pain Soak the foot twice a day with Epson salt

## 2021-09-22 ENCOUNTER — Other Ambulatory Visit: Payer: Self-pay | Admitting: Orthopedic Surgery

## 2021-09-22 DIAGNOSIS — G894 Chronic pain syndrome: Secondary | ICD-10-CM

## 2021-09-22 MED ORDER — OXYCODONE-ACETAMINOPHEN 7.5-325 MG PO TABS: ORAL_TABLET | ORAL | 0 refills | Status: DC

## 2021-09-22 NOTE — Telephone Encounter (Signed)
oxyCODONE-acetaminophen (PERCOCET) 7.5-325 MG tablet 42 tablet     [name] for refill

## 2021-09-26 ENCOUNTER — Other Ambulatory Visit (HOSPITAL_COMMUNITY): Payer: Self-pay | Admitting: Psychiatry

## 2021-09-26 DIAGNOSIS — F3130 Bipolar disorder, current episode depressed, mild or moderate severity, unspecified: Secondary | ICD-10-CM

## 2021-09-27 NOTE — Telephone Encounter (Signed)
Call for appt

## 2021-09-28 NOTE — Telephone Encounter (Signed)
Called and Diane [name]  picked up and stated she can take a message for patient due to her not being available at the time. Informed her to have patient call office and she agreed to give patient the message.

## 2021-09-29 ENCOUNTER — Other Ambulatory Visit: Payer: Self-pay | Admitting: Orthopedic Surgery

## 2021-09-29 DIAGNOSIS — G894 Chronic pain syndrome: Secondary | ICD-10-CM

## 2021-09-29 MED ORDER — OXYCODONE-ACETAMINOPHEN 7.5-325 MG PO TABS: ORAL_TABLET | ORAL | 0 refills | Status: DC

## 2021-09-29 NOTE — Telephone Encounter (Signed)
Patient called for refill: oxyCODONE-acetaminophen (PERCOCET) 7.5-325 MG tablet 42 tablet      [name] in [place]

## 2021-09-30 ENCOUNTER — Other Ambulatory Visit (HOSPITAL_COMMUNITY): Payer: Self-pay | Admitting: Psychiatry

## 2021-09-30 DIAGNOSIS — F5105 Insomnia due to other mental disorder: Secondary | ICD-10-CM

## 2021-09-30 NOTE — Telephone Encounter (Signed)
Call for appt

## 2021-10-06 ENCOUNTER — Ambulatory Visit (INDEPENDENT_AMBULATORY_CARE_PROVIDER_SITE_OTHER): Payer: Medicare Other | Admitting: Orthopedic Surgery

## 2021-10-06 ENCOUNTER — Encounter: Payer: Self-pay | Admitting: Orthopedic Surgery

## 2021-10-06 ENCOUNTER — Other Ambulatory Visit: Payer: Self-pay

## 2021-10-06 DIAGNOSIS — G894 Chronic pain syndrome: Secondary | ICD-10-CM | POA: Diagnosis not present

## 2021-10-06 DIAGNOSIS — S93491A Sprain of other ligament of right ankle, initial encounter: Secondary | ICD-10-CM

## 2021-10-06 DIAGNOSIS — S93601D Unspecified sprain of right foot, subsequent encounter: Secondary | ICD-10-CM | POA: Diagnosis not present

## 2021-10-06 MED ORDER — OXYCODONE-ACETAMINOPHEN 10-325 MG PO TABS: 1.0000 | ORAL_TABLET | ORAL | 0 refills | Status: DC | PRN

## 2021-10-06 NOTE — Progress Notes (Signed)
°  Chief Complaint  Patient presents with   Ankle Pain    Has pain right  foot and ankle/ was worked in for appointment today states cant WB fell 09/06/21    Was injured her right foot and ankle was seen on January 5 came in sooner than her appointment due to what she describes as throbbing pain  She is on oxycodone chronically she was put on ibuprofen placed in a cam walker for pain without fractures thought to have a sprained ankle and sprain foot  Examination of the right ankle and foot shows that the swelling is actually down she still tender over the lateral and medial side of the ankle joint and more pain at the midfoot forefoot with some swelling still noted  I do not see any acute process going on other than what we have talked about  Recommend she go on a walker  Increase oxycodone to 10 mg  Keep previous appointment  Continue soaking  Continue ibuprofen Encounter Diagnoses  Name Primary?   Sprain of anterior talofibular ligament of right ankle, initial encounter Yes   Chronic pain syndrome    Sprain of right foot, subsequent encounter    Meds ordered this encounter  Medications   oxyCODONE-acetaminophen (PERCOCET) 10-325 MG tablet    Sig: Take 1 tablet by mouth every 4 (four) hours as needed for up to 5 days for pain.    Dispense:  30 tablet    Refill:  0

## 2021-10-07 ENCOUNTER — Encounter (HOSPITAL_COMMUNITY): Payer: Self-pay | Admitting: Psychiatry

## 2021-10-07 ENCOUNTER — Encounter (HOSPITAL_COMMUNITY): Payer: Self-pay

## 2021-10-07 ENCOUNTER — Telehealth (INDEPENDENT_AMBULATORY_CARE_PROVIDER_SITE_OTHER): Payer: Medicare Other | Admitting: Psychiatry

## 2021-10-07 DIAGNOSIS — F3130 Bipolar disorder, current episode depressed, mild or moderate severity, unspecified: Secondary | ICD-10-CM | POA: Diagnosis not present

## 2021-10-07 DIAGNOSIS — F5105 Insomnia due to other mental disorder: Secondary | ICD-10-CM | POA: Diagnosis not present

## 2021-10-07 MED ORDER — TRAZODONE HCL 50 MG PO TABS: 50.0000 mg | ORAL_TABLET | Freq: Every day | ORAL | 2 refills | Status: DC

## 2021-10-07 MED ORDER — DIVALPROEX SODIUM 125 MG PO DR TAB: DELAYED_RELEASE_TABLET | ORAL | 2 refills | Status: DC

## 2021-10-07 MED ORDER — DULOXETINE HCL 60 MG PO CPEP: 60.0000 mg | ORAL_CAPSULE | Freq: Every day | ORAL | 2 refills | Status: DC

## 2021-10-07 MED ORDER — ALPRAZOLAM 0.5 MG PO TABS: 0.5000 mg | ORAL_TABLET | Freq: Three times a day (TID) | ORAL | 2 refills | Status: DC

## 2021-10-07 NOTE — Progress Notes (Signed)
Virtual Visit via Telephone Note  I connected with Diane [name] on 10/07/21 at 10:00 AM EST by telephone and verified that I am speaking with the correct person using two identifiers.  Location: Patient: home Provider: office   I discussed the limitations, risks, security and privacy concerns of performing an evaluation and management service by telephone and the availability of in person appointments. I also discussed with the patient that there may be a patient responsible charge related to this service. The patient expressed understanding and agreed to proceed.      I discussed the assessment and treatment plan with the patient. The patient was provided an opportunity to ask questions and all were answered. The patient agreed with the plan and demonstrated an understanding of the instructions.   The patient was advised to call back or seek an in-person evaluation if the symptoms worsen or if the condition fails to improve as anticipated.  I provided 20 minutes of non-face-to-face time during this encounter.   Levonne Spiller, MD  [facility] MD/PA/NP OP Progress Note  10/07/2021 10:22 AM Diane [name]  MRN:  [id number]  Chief Complaint:  Chief Complaint   Depression; Anxiety; Follow-up    HPI: This patient is a 71 year old black female who is living with her ex-boyfriend in [place].  She is on disability.  The patient returns after long absence.  She was last seen 11 months ago.  Since I last saw her she has had several falls and was seen in the emergency room last month.  She fortunately she did not break anything but sprained her foot.  She also has chronic back pain.  She states that she falls fairly often now.  Her blood pressures have been high during her last few visits and I urged her to address this with her primary physician.  The patient states that she and her niece had a falling out and she is now living with her ex-boyfriend at least temporarily.  She feels more depressed and  sleeps a lot.  I suggested that we cut down the trazodone.  She does not think her antidepressant is really working well which is Prozac.  I suggested that we switch this to Cymbalta which would help with chronic pain and depression and she agrees.   Visit Diagnosis:    ICD-10-CM   1. Bipolar I disorder, most recent episode depressed ([place])  F31.30 divalproex (DEPAKOTE) 125 MG DR tablet    2. 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 At that time she was using drugs and alcohol. She has been in outpatient care ever since  Past Medical History:  Past Medical History:  Diagnosis Date   Allergic rhinitis    Anxiety    Arthritis    Cataracts, bilateral    Chronic back pain    Sees pain mamagement clinic   Chronic pain syndrome    Constipation    Depression    Essential hypertension    GERD (gastroesophageal reflux disease)    Headaches, cluster    History of seizures    Hypothyroid    Mania (HCC)    Mixed hyperlipidemia    PTSD (post-traumatic stress disorder)    Right lumbar radiculopathy    Sleep apnea    STOP BANG score=4   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([place])  Past Surgical History:  Procedure Laterality Date   BACK SURGERY     BILATERAL OOPHORECTOMY     CATARACT EXTRACTION W/PHACO Left 01/04/2016   Procedure: CATARACT EXTRACTION PHACO AND INTRAOCULAR LENS PLACEMENT (IOC);  Surgeon: Rutherford Guys, MD;  Location: [facility];  Service: Ophthalmology;  Laterality: Left;  CDE:4.70   CATARACT EXTRACTION W/PHACO Right 01/18/2016   Procedure: CATARACT EXTRACTION PHACO AND INTRAOCULAR LENS PLACEMENT RIGHT EYE; CDE:  4.66;  Surgeon: Rutherford Guys, MD;  Location: [facility];  Service: Ophthalmology;  Laterality: Right;   CHOLECYSTECTOMY     COLONOSCOPY  10/16/2008   [id number], otherwise normal rectum; pancolonic diverticula  the remainder of the colonic mucosa appeared normal. Next TCS due 10/2013.   COLONOSCOPY  06/28/2006   RMR: Internal hemorrhoids. Diminutive rectal polyps, cold biopsied/ Pancolonic diverticula. Polyps in the right colon removed    COLONOSCOPY WITH PROPOFOL N/A 05/20/2015   RMR: Pancolonic diverticulosis. Redundant colon    COLONOSCOPY WITH PROPOFOL N/A 11/11/2020   Procedure: COLONOSCOPY WITH PROPOFOL;  Surgeon: Daneil Dolin, MD;  [facility]: [facility];  Service: Endoscopy;  Laterality: N/A;  pm ASA 2   ESOPHAGOGASTRODUODENOSCOPY  10/16/2008   [id number] hiatal hernia otherwise normal esophagus, stomach  D1, D2, status post passage of a 56-French Maloney dilator   ESOPHAGOGASTRODUODENOSCOPY  06/28/2006   [id number] esophagus. Small hiatal hernia. Otherwise normal stomach, D1 and D2, status post passage of a 56 French Maloney dilator   ESOPHAGOGASTRODUODENOSCOPY (EGD) WITH ESOPHAGEAL DILATION N/A 07/14/2013   RMR: Abnormal esophagus of uncertain significance-status post esophageal biospy. small hiatal hernia.    ESOPHAGOGASTRODUODENOSCOPY (EGD) WITH PROPOFOL N/A 05/20/2015   RMR: NORMAL EGD status post maloney dilation    ESOPHAGOGASTRODUODENOSCOPY (EGD) WITH PROPOFOL N/A 12/23/2018   Dr. Gala Romney: normal esophagus, s/p esophageal dilation.    FOOT SURGERY     left foot-   INCISION AND DRAINAGE Right 01/03/2013   Procedure: INCISION AND DRAINAGE;  Surgeon: Carole Civil, MD;  Location: [facility];  Service: Orthopedics;  Laterality: Right;   KNEE ARTHROSCOPY  01/2012   right   KNEE ARTHROSCOPY WITH LATERAL RELEASE Right 01/03/2013   Procedure: Lateral Release Patella Right Knee;  Surgeon: Carole Civil, MD;  Location: [facility];  Service: Orthopedics;  Laterality: Right;   LAPAROSCOPIC LYSIS OF ADHESIONS N/A 11/12/2019   Procedure: LAPAROSCOPIC LYSIS OF ADHESIONS, extensive; drainage of peritoneal cyst;  Surgeon: Florian Buff, MD;  Location: [facility];  Service: Gynecology;  Laterality: N/A;   LUMBAR  DISC SURGERY  L4-5   MALONEY DILATION N/A 05/20/2015   Procedure: Venia Minks DILATION;  Surgeon: Daneil Dolin, MD;  Location: [facility];  Service: Endoscopy;  Laterality: N/AVenia Minks dilator # 23   MALONEY DILATION N/A 12/23/2018   Procedure: Venia Minks DILATION;  Surgeon: Daneil Dolin, MD;  [facility];  Service: Endoscopy;  Laterality: N/A;   PARTIAL HYSTERECTOMY     TOTAL KNEE ARTHROPLASTY  07/01/2012   Procedure: TOTAL KNEE ARTHROPLASTY;  Surgeon: Carole Civil, MD;  Location: [facility];  Service: Orthopedics;  Laterality: Right;   TOTAL KNEE REVISION Right 01/03/2013   Procedure: Patellaplasty Right Knee;  Surgeon: Carole Civil, MD;  Location: [facility];  Service: Orthopedics;  Laterality: Right;    Family Psychiatric History: see below  Family History:  Family History  Problem Relation Age of Onset   Alcohol abuse Mother    Anxiety disorder Mother    Alcohol abuse Father    Anxiety disorder Father  Breast cancer Sister    Bipolar disorder Sister    Breast cancer Sister    Dementia Paternal Aunt    Alcohol abuse Brother    Anxiety disorder Brother    Drug abuse Brother    Drug abuse Brother    Alcohol abuse Brother    Anxiety disorder Brother    Seizures Brother    Alcohol abuse Brother    Anxiety disorder Brother    Seizures Brother    Alcohol abuse Brother    Anxiety disorder Brother    Alcohol abuse Brother    Anxiety disorder Brother    ADD / ADHD Grandchild    ADD / ADHD Grandchild    Heart disease Other    Diabetes Other    Alcohol abuse Other    Depression Neg Hx    OCD Neg Hx    Paranoid behavior Neg Hx    Schizophrenia Neg Hx    Sexual abuse Neg Hx    Physical abuse Neg Hx    Colon cancer Neg Hx     Social History:  Social History   Socioeconomic History   Marital status: Legally Separated    Spouse name: Not on file   Number of children: Not on file   Years of education: 12th grade   Highest education level: Not on file   Occupational History   Occupation: disabled    Employer: UNEMPLOYED  Tobacco Use   Smoking status: Former    Packs/day: 0.25    Years: 5.00    Pack years: 1.25    Types: Cigarettes    Quit date: 06/26/2003    Years since quitting: 18.2   Smokeless tobacco: Never  Vaping Use   Vaping Use: Never used  Substance and Sexual Activity   Alcohol use: Yes    Alcohol/week: 0.0 standard drinks    Comment: occasionally; once a month.   Drug use: Not Currently    Comment: has a past history of street drug use   Sexual activity: Yes    Birth control/protection: Surgical  Other Topics Concern   Not on file  Social History Narrative   Not on file   Social Determinants of Health   Financial Resource Strain: Not on file  Food Insecurity: Not on file  Transportation Needs: Not on file  Physical Activity: Not on file  Stress: Not on file  Social Connections: Not on file    Allergies:  Allergies  Allergen Reactions   Neomycin-Bacitracin Zn-Polymyx Other (See Comments)    Reaction: skin starts to become raw and peels off   Penicillins Hives   Sulfonamide Derivatives Hives and Swelling    Metabolic Disorder Labs: Lab Results  Component Value Date   HGBA1C 5.5 02/19/2020   MPG 111.15 02/19/2020   MPG 120 (H) 01/25/2012   No results found for: PROLACTIN Lab Results  Component Value Date   CHOL  12/10/2010    69        ATP III CLASSIFICATION:  <200     mg/dL   Desirable  200-239  mg/dL   Borderline High  >=240    mg/dL   High          TRIG 38 12/10/2010   HDL 28 (L) 12/10/2010   CHOLHDL 2.5 12/10/2010   VLDL 8 12/10/2010   LDLCALC  12/10/2010    33        Total Cholesterol/HDL:CHD Risk Coronary Heart Disease Risk Table  Men   Women  1/2 Average Risk   3.4   3.3  Average Risk       5.0   4.4  2 X Average Risk   9.6   7.1  3 X Average Risk  23.4   11.0        Use the calculated Patient Ratio above and the CHD Risk Table to determine the  patient's CHD Risk.        ATP III CLASSIFICATION (LDL):  <100     mg/dL   Optimal  100-129  mg/dL   Near or Above                    Optimal  130-159  mg/dL   Borderline  160-189  mg/dL   High  >190     mg/dL   Very High   LDLCALC 71 07/12/2009   Lab Results  Component Value Date   TSH 7.105 (H) 02/18/2020   TSH 0.52 02/11/2018    Therapeutic Level Labs: No results found for: LITHIUM Lab Results  Component Value Date   VALPROATE 70.7 09/24/2018   VALPROATE 71.5 01/30/2017   No components found for:  CBMZ  Current Medications: Current Outpatient Medications  Medication Sig Dispense Refill   DULoxetine (CYMBALTA) 60 MG capsule Take 1 capsule (60 mg total) by mouth daily. 30 capsule 2   traZODone (DESYREL) 50 MG tablet Take 1 tablet (50 mg total) by mouth at bedtime. 30 tablet 2   ALPRAZolam (XANAX) 0.5 MG tablet Take 1 tablet (0.5 mg total) by mouth 3 (three) times daily. 90 tablet 2   ascorbic acid (VITAMIN C) 500 MG tablet Take 500 mg by mouth daily.     atorvastatin (LIPITOR) 20 MG tablet Take 20 mg by mouth daily.     cyclobenzaprine (FLEXERIL) 10 MG tablet TAKE ONE TABLET BY MOUTH THREE TIMES DAILY AS NEEDED FOR MUSCLE SPASMS 90 tablet 5   divalproex (DEPAKOTE) 125 MG DR tablet TAKE THREE (3) TABLETS BY MOUTH AT BEDTIME 90 tablet 2   ferrous sulfate 325 (65 FE) MG tablet Take 325 mg by mouth daily with breakfast.     hydrocortisone (ANUSOL-HC) 2.5 % rectal cream Place 1 application rectally 2 (two) times daily. (Patient taking differently: Place 1 application rectally as needed.) 30 g 1   ibuprofen (ADVIL) 800 MG tablet Take 1 tablet (800 mg total) by mouth every 8 (eight) hours as needed. 90 tablet 1   levothyroxine (SYNTHROID) 100 MCG tablet Take 100 mcg by mouth daily.     levothyroxine (SYNTHROID) 125 MCG tablet Take 125 mcg by mouth daily before breakfast.     lisinopril (PRINIVIL,ZESTRIL) 10 MG tablet Take 10 mg by mouth at bedtime.      lubiprostone (AMITIZA) 24  MCG capsule TAKE ONE CAPSULE BY MOUTH TWICE DAILY WITH A MEAL 60 capsule 11   oxybutynin (DITROPAN-XL) 10 MG 24 hr tablet Take 10 mg by mouth at bedtime.     oxyCODONE-acetaminophen (PERCOCET) 10-325 MG tablet Take 1 tablet by mouth every 4 (four) hours as needed for up to 5 days for pain. 30 tablet 0   pantoprazole (PROTONIX) 40 MG tablet TAKE ONE TABLET BY MOUTH TWICE DAILY 60 tablet 5   SYMPROIC 0.2 MG TABS TAKE ONE TABLET BY MOUTH DAILY 30 tablet 2   No current facility-administered medications for this visit.     Musculoskeletal: Strength & Muscle Tone: na Gait & Station: na Patient leans: N/A  Psychiatric  Specialty Exam: Review of Systems  Constitutional:  Positive for fatigue.  Musculoskeletal:  Positive for arthralgias, back pain and joint swelling.  Neurological:  Positive for dizziness and syncope.  Psychiatric/Behavioral:  Positive for dysphoric mood.   All other systems reviewed and are negative.  There were no vitals taken for this visit.There is no height or weight on file to calculate BMI.  General Appearance: NA  Eye Contact:  NA  Speech:  NA  Volume:  Normal  Mood:  Depressed  Affect:  NA  Thought Process:  Goal Directed  Orientation:  Full (Time, Place, and Person)  Thought Content: Rumination   Suicidal Thoughts:  No  Homicidal Thoughts:  No  Memory:  Immediate;   Good Recent;   Good Remote;   NA  Judgement:  Fair  Insight:  Shallow  Psychomotor Activity:  Decreased  Concentration:  Concentration: Fair and Attention Span: Fair  Recall:  Good  Fund of Knowledge: Good  Language: Good  Akathisia:  No  Handed:  Right  AIMS (if indicated): not done  Assets:  Communication Skills Desire for Improvement Resilience Social Support  ADL's:  Intact  Cognition: WNL  Sleep:  Good   Screenings: PHQ2-9    Flowsheet Row Video Visit from 10/07/2021 in [facility]  PHQ-2 Total Score 4  PHQ-9 Total Score 13       Flowsheet Row ED from 09/06/2021 in [facility] (Discharged) from 11/11/2020 in [facility] No Risk Error: Question 6 not populated        Assessment and Plan: 71 year old female with a history of depression anxiety chronic pain and remote history of alcohol abuse.  She is again having the lightheaded episodes and I urged her to contact her primary doctor.  As she is getting older we will need to cut down on the over sedating medications.  Since she is sleeping too much we will cut down trazodone to 50 mg at bedtime.  She will continue Xanax 0.5 mg 3 times daily.  Since the Prozac is no longer helping the depression we will switch to Cymbalta 60 mg daily.  She will continue Depakote 125 mg 3 times daily for mood stabilization.  She will return to see me in 4 weeks   Levonne Spiller, MD 10/07/2021, 10:22 AM

## 2021-10-11 ENCOUNTER — Telehealth: Payer: Self-pay

## 2021-10-11 NOTE — Telephone Encounter (Signed)
PA for symproic was approved from 09/27/21 through 09/10/22. Approval letter has been scanned into patient's chart.

## 2021-10-13 ENCOUNTER — Ambulatory Visit (INDEPENDENT_AMBULATORY_CARE_PROVIDER_SITE_OTHER): Payer: Medicare Other | Admitting: Orthopedic Surgery

## 2021-10-13 ENCOUNTER — Ambulatory Visit: Payer: Medicare Other | Admitting: Orthopedic Surgery

## 2021-10-13 ENCOUNTER — Other Ambulatory Visit: Payer: Self-pay | Admitting: Orthopedic Surgery

## 2021-10-13 ENCOUNTER — Other Ambulatory Visit: Payer: Self-pay

## 2021-10-13 DIAGNOSIS — G894 Chronic pain syndrome: Secondary | ICD-10-CM

## 2021-10-13 DIAGNOSIS — S93491A Sprain of other ligament of right ankle, initial encounter: Secondary | ICD-10-CM

## 2021-10-13 DIAGNOSIS — S93601D Unspecified sprain of right foot, subsequent encounter: Secondary | ICD-10-CM

## 2021-10-13 MED ORDER — OXYCODONE-ACETAMINOPHEN 10-325 MG PO TABS: 1.0000 | ORAL_TABLET | ORAL | 0 refills | Status: DC | PRN

## 2021-10-13 NOTE — Telephone Encounter (Signed)
Patient called requesting refill for her  pain medicine.     oxyCODONE-acetaminophen (PERCOCET) 10-325 MG tablet (Expired)  Pharm[facility]: [facility]

## 2021-10-13 NOTE — Telephone Encounter (Signed)
Refill request sent to provider.

## 2021-10-13 NOTE — Progress Notes (Signed)
Chief Complaint  Patient presents with   Ankle Injury    RT DOI 09/06/21 fall   Week 5 since injury   71 year old female had a twisting fall injury back in December she is week 5 since injury we had to change her treatment regimen to a walker with a cam boot after the cam boot alone and a cane did not seem to relieve her pain we also increased her oxycodone to 10 mg instead of 7-1/2.  The 7 and half milligrams she is on for chronic pain  She has slight improvement now that she is on the walker and the boot.  Reexamination reveals tenderness over the anterior talofibular ligament and the midfoot  I reviewed the x-rays again I do not see a fracture  Recommend she continue with weightbearing with the cam walker and the walker and the 10 mg of oxycodone  Follow-up in 3 weeks  Meds ordered this encounter  Medications   oxyCODONE-acetaminophen (PERCOCET) 10-325 MG tablet    Sig: Take 1 tablet by mouth every 4 (four) hours as needed for up to 5 days for pain.    Dispense:  30 tablet    Refill:  0

## 2021-10-20 ENCOUNTER — Other Ambulatory Visit: Payer: Self-pay | Admitting: Radiology

## 2021-10-20 ENCOUNTER — Ambulatory Visit: Payer: Medicare Other | Admitting: Orthopedic Surgery

## 2021-10-20 DIAGNOSIS — G894 Chronic pain syndrome: Secondary | ICD-10-CM

## 2021-10-20 DIAGNOSIS — S93601D Unspecified sprain of right foot, subsequent encounter: Secondary | ICD-10-CM

## 2021-10-20 DIAGNOSIS — S93491A Sprain of other ligament of right ankle, initial encounter: Secondary | ICD-10-CM

## 2021-10-20 MED ORDER — OXYCODONE-ACETAMINOPHEN 10-325 MG PO TABS: 1.0000 | ORAL_TABLET | ORAL | 0 refills | Status: DC | PRN

## 2021-10-20 NOTE — Telephone Encounter (Signed)
Patient called, requested refill percocet.  [name].

## 2021-10-27 ENCOUNTER — Other Ambulatory Visit: Payer: Self-pay | Admitting: Orthopedic Surgery

## 2021-10-27 DIAGNOSIS — S93601D Unspecified sprain of right foot, subsequent encounter: Secondary | ICD-10-CM

## 2021-10-27 DIAGNOSIS — S93491A Sprain of other ligament of right ankle, initial encounter: Secondary | ICD-10-CM

## 2021-10-27 DIAGNOSIS — G894 Chronic pain syndrome: Secondary | ICD-10-CM

## 2021-10-27 MED ORDER — OXYCODONE-ACETAMINOPHEN 10-325 MG PO TABS: 1.0000 | ORAL_TABLET | ORAL | 0 refills | Status: DC | PRN

## 2021-10-27 NOTE — Telephone Encounter (Signed)
Patient called for refill: oxyCODONE-acetaminophen (PERCOCET) 10-325 MG tablet 30 tablet       [facility] in [place]

## 2021-11-03 ENCOUNTER — Encounter: Payer: Self-pay | Admitting: Orthopedic Surgery

## 2021-11-03 ENCOUNTER — Other Ambulatory Visit: Payer: Self-pay

## 2021-11-03 ENCOUNTER — Ambulatory Visit (INDEPENDENT_AMBULATORY_CARE_PROVIDER_SITE_OTHER): Payer: Medicare Other | Admitting: Orthopedic Surgery

## 2021-11-03 DIAGNOSIS — G894 Chronic pain syndrome: Secondary | ICD-10-CM | POA: Diagnosis not present

## 2021-11-03 DIAGNOSIS — S93491A Sprain of other ligament of right ankle, initial encounter: Secondary | ICD-10-CM | POA: Diagnosis not present

## 2021-11-03 DIAGNOSIS — S93601D Unspecified sprain of right foot, subsequent encounter: Secondary | ICD-10-CM

## 2021-11-03 MED ORDER — OXYCODONE-ACETAMINOPHEN 10-325 MG PO TABS: 1.0000 | ORAL_TABLET | ORAL | 0 refills | Status: DC | PRN

## 2021-11-03 NOTE — Progress Notes (Signed)
Chief complaint right ankle pain and foot pain  Patient filled September 06, 2021.  We saw her and treated her for ankle sprain and foot sprain.  She did not improve so we made her nonweightbearing and increased her pain medication to 10 mg of oxycodone instead of 7.5 mg.  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    Dispense:  30 tablet    Refill:  0

## 2021-11-03 NOTE — Patient Instructions (Addendum)
Continue using the walker in the boot to unload the foot  We will order an MRI of your foot and we refilled your pain medication you will take Percocet 10 mg with 325 of acetaminophen every For the MRI please go ahead and call to schedule your appointment with [facility] within at least one (1) week.   Central Scheduling [phone]

## 2021-11-04 ENCOUNTER — Telehealth (HOSPITAL_COMMUNITY): Payer: Medicare Other | Admitting: Psychiatry

## 2021-11-10 ENCOUNTER — Other Ambulatory Visit: Payer: Self-pay

## 2021-11-10 DIAGNOSIS — S93601D Unspecified sprain of right foot, subsequent encounter: Secondary | ICD-10-CM

## 2021-11-10 DIAGNOSIS — G894 Chronic pain syndrome: Secondary | ICD-10-CM

## 2021-11-10 DIAGNOSIS — S93491A Sprain of other ligament of right ankle, initial encounter: Secondary | ICD-10-CM

## 2021-11-10 MED ORDER — OXYCODONE-ACETAMINOPHEN 10-325 MG PO TABS: 1.0000 | ORAL_TABLET | ORAL | 0 refills | Status: DC | PRN

## 2021-11-10 NOTE — Telephone Encounter (Signed)
Oxycodone-Hydrocodone-10/325 MG  Qty 30 Tablets ? ?Take 1 tablet by mouth every 4(four) hours as needed for up to 5 days for pain. ? ?PATIENT USES [facility] IN [place] ?

## 2021-11-10 NOTE — Telephone Encounter (Signed)
Refill request forwarded to provider

## 2021-11-15 ENCOUNTER — Other Ambulatory Visit: Payer: Self-pay | Admitting: Orthopedic Surgery

## 2021-11-15 DIAGNOSIS — S93491A Sprain of other ligament of right ankle, initial encounter: Secondary | ICD-10-CM

## 2021-11-15 DIAGNOSIS — G894 Chronic pain syndrome: Secondary | ICD-10-CM

## 2021-11-15 DIAGNOSIS — S93601D Unspecified sprain of right foot, subsequent encounter: Secondary | ICD-10-CM

## 2021-11-15 MED ORDER — OXYCODONE-ACETAMINOPHEN 10-325 MG PO TABS: 1.0000 | ORAL_TABLET | ORAL | 0 refills | Status: DC | PRN

## 2021-11-15 NOTE — Progress Notes (Signed)
Meds ordered this encounter  Medications   oxyCODONE-acetaminophen (PERCOCET) 10-325 MG tablet    Sig: Take 1 tablet by mouth every 4 (four) hours as needed for up to 5 days for pain.    Dispense:  30 tablet    Refill:  0    

## 2021-11-17 ENCOUNTER — Telehealth (HOSPITAL_COMMUNITY): Payer: Medicare Other | Admitting: Psychiatry

## 2021-11-17 ENCOUNTER — Other Ambulatory Visit: Payer: Self-pay

## 2021-11-17 ENCOUNTER — Telehealth: Payer: Self-pay

## 2021-11-17 ENCOUNTER — Ambulatory Visit (HOSPITAL_COMMUNITY)
Admission: RE | Admit: 2021-11-17 | Discharge: 2021-11-17 | Disposition: A | Discharge status: Home / Self Care | Payer: Medicare Other | Source: Ambulatory Visit | Attending: Orthopedic Surgery | Admitting: Orthopedic Surgery

## 2021-11-17 DIAGNOSIS — S92341A Displaced fracture of fourth metatarsal bone, right foot, initial encounter for closed fracture: Secondary | ICD-10-CM | POA: Diagnosis not present

## 2021-11-17 DIAGNOSIS — S93491A Sprain of other ligament of right ankle, initial encounter: Secondary | ICD-10-CM | POA: Diagnosis not present

## 2021-11-17 DIAGNOSIS — S92331A Displaced fracture of third metatarsal bone, right foot, initial encounter for closed fracture: Secondary | ICD-10-CM | POA: Diagnosis not present

## 2021-11-17 DIAGNOSIS — S92321A Displaced fracture of second metatarsal bone, right foot, initial encounter for closed fracture: Secondary | ICD-10-CM | POA: Diagnosis not present

## 2021-11-17 DIAGNOSIS — M7989 Other specified soft tissue disorders: Secondary | ICD-10-CM | POA: Diagnosis not present

## 2021-11-17 DIAGNOSIS — G894 Chronic pain syndrome: Secondary | ICD-10-CM

## 2021-11-17 DIAGNOSIS — S93601D Unspecified sprain of right foot, subsequent encounter: Secondary | ICD-10-CM

## 2021-11-17 MED ORDER — OXYCODONE-ACETAMINOPHEN 10-325 MG PO TABS: 1.0000 | ORAL_TABLET | ORAL | 0 refills | Status: DC | PRN

## 2021-11-17 NOTE — Telephone Encounter (Signed)
Patient is asking for refill ? ?Hydrocodone-Acetaminophen 5/325 mg   ? ?Take 1 tablet by mouth every 6(six) hours as needed for pain. ?

## 2021-11-18 DIAGNOSIS — M810 Age-related osteoporosis without current pathological fracture: Secondary | ICD-10-CM | POA: Diagnosis not present

## 2021-11-18 DIAGNOSIS — Z780 Asymptomatic menopausal state: Secondary | ICD-10-CM | POA: Diagnosis not present

## 2021-11-21 ENCOUNTER — Ambulatory Visit (INDEPENDENT_AMBULATORY_CARE_PROVIDER_SITE_OTHER): Payer: Medicare Other | Admitting: Orthopedic Surgery

## 2021-11-21 ENCOUNTER — Other Ambulatory Visit: Payer: Self-pay

## 2021-11-21 DIAGNOSIS — S93621D Sprain of tarsometatarsal ligament of right foot, subsequent encounter: Secondary | ICD-10-CM | POA: Diagnosis not present

## 2021-11-21 DIAGNOSIS — S92324G Nondisplaced fracture of second metatarsal bone, right foot, subsequent encounter for fracture with delayed healing: Secondary | ICD-10-CM | POA: Diagnosis not present

## 2021-11-21 DIAGNOSIS — S92334G Nondisplaced fracture of third metatarsal bone, right foot, subsequent encounter for fracture with delayed healing: Secondary | ICD-10-CM

## 2021-11-21 DIAGNOSIS — S92344G Nondisplaced fracture of fourth metatarsal bone, right foot, subsequent encounter for fracture with delayed healing: Secondary | ICD-10-CM

## 2021-11-21 NOTE — Progress Notes (Signed)
Chief Complaint  ?Patient presents with  ? Foot Injury  ?  RT foot//MRI results//pain is about the same ?DOI 09/06/21  ? ?Encounter Diagnoses  ?Name Primary?  ? Sprain of tarsometatarsal ligament of right foot, subsequent encounter Yes  ? Closed nondisplaced fracture of second metatarsal bone of right foot with delayed healing, subsequent encounter   ? Nondisplaced fracture of third metatarsal bone, right foot, subsequent encounter for fracture with delayed healing   ? Closed nondisplaced fracture of fourth metatarsal bone of right foot with delayed healing, subsequent encounter   ? ? ?MRI was done due to persistent pain on the dorsum of the right foot ?She is not diabetic ? ?I interpret the MRI as follows ?Her MRI shows that she has fracture of the base second third and fourth metatarsal  ? ?although the MRI report reads mild dorsal subluxation of the second and third TMT joints ? 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 ?Ms. Raap wants to go in a cast instead of the cam walker so she is going to come back on Thursday to get that because the close that she is wearing today and will not allow cast application ? ? ? ?

## 2021-11-24 ENCOUNTER — Other Ambulatory Visit: Payer: Self-pay

## 2021-11-24 ENCOUNTER — Ambulatory Visit (INDEPENDENT_AMBULATORY_CARE_PROVIDER_SITE_OTHER): Payer: Medicare Other | Admitting: Orthopedic Surgery

## 2021-11-24 DIAGNOSIS — S93621D Sprain of tarsometatarsal ligament of right foot, subsequent encounter: Secondary | ICD-10-CM | POA: Diagnosis not present

## 2021-11-24 DIAGNOSIS — S93601D Unspecified sprain of right foot, subsequent encounter: Secondary | ICD-10-CM

## 2021-11-24 DIAGNOSIS — S92344G Nondisplaced fracture of fourth metatarsal bone, right foot, subsequent encounter for fracture with delayed healing: Secondary | ICD-10-CM

## 2021-11-24 DIAGNOSIS — S92324G Nondisplaced fracture of second metatarsal bone, right foot, subsequent encounter for fracture with delayed healing: Secondary | ICD-10-CM

## 2021-11-24 DIAGNOSIS — G894 Chronic pain syndrome: Secondary | ICD-10-CM

## 2021-11-24 DIAGNOSIS — S92334G Nondisplaced fracture of third metatarsal bone, right foot, subsequent encounter for fracture with delayed healing: Secondary | ICD-10-CM

## 2021-11-24 DIAGNOSIS — S93491A Sprain of other ligament of right ankle, initial encounter: Secondary | ICD-10-CM

## 2021-11-24 MED ORDER — OXYCODONE-ACETAMINOPHEN 10-325 MG PO TABS: 1.0000 | ORAL_TABLET | ORAL | 0 refills | Status: DC | PRN

## 2021-11-24 NOTE — Progress Notes (Signed)
Diane [name] has come back today for her cast ? ?Chief Complaint  ?Patient presents with  ? Cast  ?  Patient here to place cast  ? ? ?Encounter Diagnoses  ?Name Primary?  ? Sprain of anterior talofibular ligament of right ankle, initial encounter   ? Chronic pain syndrome   ? Sprain of right foot, subsequent encounter   ? Closed nondisplaced fracture of second metatarsal bone of right foot with delayed healing, subsequent encounter Yes  ? Nondisplaced fracture of third metatarsal bone, right foot, subsequent encounter for fracture with delayed healing   ? Closed nondisplaced fracture of fourth metatarsal bone of right foot with delayed healing, subsequent encounter   ? ? ? ? ?In review she had a foot injury x-rays were very benign she continued to have pain despite wearing a cam walker and therefore had an MRI her MRI shows that she has multiple fractures near Lisfranc's joint without any subluxation on plain film ? ?She did not want to continue in the cam walker and preferred to wear a cast ? ?She was placed in a short leg walking cast follow-up in 4 to 6 weeks ? ?Meds ordered this encounter  ?Medications  ? oxyCODONE-acetaminophen (PERCOCET) 10-325 MG tablet  ?  Sig: Take 1 tablet by mouth every 4 (four) hours as needed for up to 5 days for pain.  ?  Dispense:  30 tablet  ?  Refill:  0  ? ? ? ?

## 2021-11-28 ENCOUNTER — Telehealth (INDEPENDENT_AMBULATORY_CARE_PROVIDER_SITE_OTHER): Payer: Medicare Other | Admitting: Psychiatry

## 2021-11-28 ENCOUNTER — Other Ambulatory Visit: Payer: Self-pay

## 2021-11-28 ENCOUNTER — Encounter (HOSPITAL_COMMUNITY): Payer: Self-pay | Admitting: Psychiatry

## 2021-11-28 DIAGNOSIS — F3130 Bipolar disorder, current episode depressed, mild or moderate severity, unspecified: Secondary | ICD-10-CM

## 2021-11-28 DIAGNOSIS — F5105 Insomnia due to other mental disorder: Secondary | ICD-10-CM | POA: Diagnosis not present

## 2021-11-28 MED ORDER — TRAZODONE HCL 50 MG PO TABS: 50.0000 mg | ORAL_TABLET | Freq: Every day | ORAL | 2 refills | Status: DC

## 2021-11-28 MED ORDER — DULOXETINE HCL 60 MG PO CPEP: 60.0000 mg | ORAL_CAPSULE | Freq: Every day | ORAL | 2 refills | Status: DC

## 2021-11-28 MED ORDER — DIVALPROEX SODIUM 125 MG PO DR TAB: DELAYED_RELEASE_TABLET | ORAL | 2 refills | Status: DC

## 2021-11-28 MED ORDER — ALPRAZOLAM 0.5 MG PO TABS: 0.5000 mg | ORAL_TABLET | Freq: Three times a day (TID) | ORAL | 2 refills | Status: DC

## 2021-11-28 NOTE — Progress Notes (Signed)
Virtual Visit via Telephone Note ? ?I connected with Diane [name] on 11/28/21 at  9:00 AM EDT by telephone and verified that I am speaking with the correct person using two identifiers. ? ?Location: ?Patient: home ?Provider: office ?  ?I discussed the limitations, risks, security and privacy concerns of performing an evaluation and management service by telephone and the availability of in person appointments. I also discussed with the patient that there may be a patient responsible charge related to this service. The patient expressed understanding and agreed to proceed. ? ? ?  ?I discussed the assessment and treatment plan with the patient. The patient was provided an opportunity to ask questions and all were answered. The patient agreed with the plan and demonstrated an understanding of the instructions. ?  ?The patient was advised to call back or seek an in-person evaluation if the symptoms worsen or if the condition fails to improve as anticipated. ? ?I provided 12 minutes of non-face-to-face time during this encounter. ? ? ?Levonne Spiller, MD ? ?BH MD/PA/NP OP Progress Note ? ?11/28/2021 9:21 AM ?Diane [name]  ?MRN:  [id number] ? ?Chief Complaint:  ?Chief Complaint  ?Patient presents with  ? Depression  ? Anxiety  ? Follow-up  ? ?HPI: This patient is a 71 year old black female who is living with an ex-boyfriend in [place].  She is on disability. ? ?The patient returns for follow-up after about 2 months.  She told me last time that she had several falls and had sprained her ankle.  However it turned out recently on an MRI that the ankle is actually broken.  Last week she was put in a cast. ? ?The patient states that she is feeling a bit better with the Cymbalta but still is very tired through the day.  She is sleeping well with the trazodone.  She takes her more sedating medicines in the later part of the day some not sure why she is still is tired in the morning.  She did not want to cut back on the trazodone.  She  denies any thoughts of self-harm or suicidal ideation.  She states she feels sad often because her family seems to be ignoring her.  She claims that she is avoiding drugs and alcohol ?Visit Diagnosis:  ?  ICD-10-CM   ?1. Bipolar I disorder, most recent episode depressed ([place])  F31.30 divalproex (DEPAKOTE) 125 MG DR tablet  ?  ?2. Insomnia due to other mental disorder (CODE)  F51.05 ALPRAZolam (XANAX) 0.5 MG tablet  ?  ? ? ?Past Psychiatric History: Patient has a history of [facility] in [facility]. At that time she was using drugs and alcohol. She has been in outpatient care ever since ? ?Past Medical History:  ?Past Medical History:  ?Diagnosis Date  ? Allergic rhinitis   ? Anxiety   ? Arthritis   ? Cataracts, bilateral   ? Chronic back pain   ? Sees pain [facility] clinic  ? Chronic pain syndrome   ? Constipation   ? Depression   ? Essential hypertension   ? GERD (gastroesophageal reflux disease)   ? Headaches, cluster   ? History of seizures   ? Hypothyroid   ? Mania ([place])   ? Mixed hyperlipidemia   ? PTSD (post-traumatic stress disorder)   ? Right lumbar radiculopathy   ? Sleep apnea   ? STOP BANG score=4  ? 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)   ?  ?Past Surgical History:  ?  Procedure Laterality Date  ? BACK SURGERY    ? BILATERAL OOPHORECTOMY    ? CATARACT EXTRACTION W/PHACO Left 01/04/2016  ? Procedure: CATARACT EXTRACTION PHACO AND INTRAOCULAR LENS PLACEMENT (IOC);  Surgeon: Rutherford Guys, MD;  Location: [facility];  Service: Ophthalmology;  Laterality: Left;  CDE:4.70  ? CATARACT EXTRACTION W/PHACO Right 01/18/2016  ? Procedure: CATARACT EXTRACTION PHACO AND INTRAOCULAR LENS PLACEMENT RIGHT EYE; CDE:  4.66;  Surgeon: Rutherford Guys, MD;  Location: [facility];  Service: Ophthalmology;  Laterality: Right;  ? CHOLECYSTECTOMY    ? COLONOSCOPY  10/16/2008  ? [id number], otherwise normal rectum; pancolonic diverticula the remainder of the colonic mucosa appeared normal. Next TCS due  10/2013.  ? COLONOSCOPY  06/28/2006  ? RMR: Internal hemorrhoids. Diminutive rectal polyps, cold biopsied/ Pancolonic diverticula. Polyps in the right colon removed   ? COLONOSCOPY WITH PROPOFOL N/A 05/20/2015  ? RMR: Pancolonic diverticulosis. Redundant colon   ? COLONOSCOPY WITH PROPOFOL N/A 11/11/2020  ? Procedure: COLONOSCOPY WITH PROPOFOL;  Surgeon: Daneil Dolin, MD;  [facility];  Service: Endoscopy;  Laterality: N/A;  pm ASA 2  ? ESOPHAGOGASTRODUODENOSCOPY  10/16/2008  ? [id number] hiatal hernia otherwise normal esophagus, stomach  D1, D2, status post passage of a 56-French Maloney dilator  ? ESOPHAGOGASTRODUODENOSCOPY  06/28/2006  ? [id number] esophagus. Small hiatal hernia. Otherwise normal stomach, D1 and D2, status post passage of a 56 [place] Maloney dilator  ? ESOPHAGOGASTRODUODENOSCOPY (EGD) WITH ESOPHAGEAL DILATION N/A 07/14/2013  ? RMR: Abnormal esophagus of uncertain significance-status post esophageal biospy. small hiatal hernia.   ? ESOPHAGOGASTRODUODENOSCOPY (EGD) WITH PROPOFOL N/A 05/20/2015  ? RMR: NORMAL EGD status post maloney dilation   ? ESOPHAGOGASTRODUODENOSCOPY (EGD) WITH PROPOFOL N/A 12/23/2018  ? Dr. Gala Romney: normal esophagus, s/p esophageal dilation.   ? FOOT SURGERY    ? left foot-  ? INCISION AND DRAINAGE Right 01/03/2013  ? Procedure: INCISION AND DRAINAGE;  Surgeon: Carole Civil, MD;  Location: [facility];  Service: Orthopedics;  Laterality: Right;  ? KNEE ARTHROSCOPY  01/2012  ? right  ? KNEE ARTHROSCOPY WITH LATERAL RELEASE Right 01/03/2013  ? Procedure: Lateral Release Patella Right Knee;  Surgeon: Carole Civil, MD;  Location: [facility];  Service: Orthopedics;  Laterality: Right;  ? LAPAROSCOPIC LYSIS OF ADHESIONS N/A 11/12/2019  ? Procedure: LAPAROSCOPIC LYSIS OF ADHESIONS, extensive; drainage of peritoneal cyst;  Surgeon: Florian Buff, MD;  Location: [facility];  Service: Gynecology;  Laterality: N/A;  ? LUMBAR DISC SURGERY  L4-5  ? MALONEY DILATION N/A 05/20/2015  ? Procedure:  MALONEY DILATION;  Surgeon: Daneil Dolin, MD;  Location: [facility];  Service: Endoscopy;  Laterality: N/AVenia Minks dilator # 1  ? MALONEY DILATION N/A 12/23/2018  ? Procedure: MALONEY DILATION;  Surgeon: Daneil Dolin, MD;  [facility];  Service: Endoscopy;  Laterality: N/A;  ? PARTIAL HYSTERECTOMY    ? TOTAL KNEE ARTHROPLASTY  07/01/2012  ? Procedure: TOTAL KNEE ARTHROPLASTY;  Surgeon: Carole Civil, MD;  Location: [facility];  Service: Orthopedics;  Laterality: Right;  ? TOTAL KNEE REVISION Right 01/03/2013  ? Procedure: Patellaplasty Right Knee;  Surgeon: Carole Civil, MD;  Location: [facility];  Service: Orthopedics;  Laterality: Right;  ? ? ?Family Psychiatric History: see below ? ?Family History:  ?Family History  ?Problem Relation Age of Onset  ? Alcohol abuse Mother   ? Anxiety disorder Mother   ? Alcohol abuse Father   ? Anxiety disorder Father   ? Breast cancer  Sister   ? Bipolar disorder Sister   ? Breast cancer Sister   ? Dementia Paternal Aunt   ? Alcohol abuse Brother   ? Anxiety disorder Brother   ? Drug abuse Brother   ? Drug abuse Brother   ? Alcohol abuse Brother   ? Anxiety disorder Brother   ? Seizures Brother   ? Alcohol abuse Brother   ? Anxiety disorder Brother   ? Seizures Brother   ? Alcohol abuse Brother   ? Anxiety disorder Brother   ? Alcohol abuse Brother   ? Anxiety disorder Brother   ? ADD / ADHD Grandchild   ? ADD / ADHD Grandchild   ? Heart disease Other   ? Diabetes Other   ? Alcohol abuse Other   ? Depression Neg Hx   ? OCD Neg Hx   ? Paranoid behavior Neg Hx   ? Schizophrenia Neg Hx   ? Sexual abuse Neg Hx   ? Physical abuse Neg Hx   ? Colon cancer Neg Hx   ? ? ?Social History:  ?Social History  ? ?Socioeconomic History  ? Marital status: Legally Separated  ?  Spouse name: Not on file  ? Number of children: Not on file  ? Years of education: 12th grade  ? Highest education level: Not on file  ?Occupational History  ? Occupation: disabled  ?  Employer: UNEMPLOYED   ?Tobacco Use  ? Smoking status: Former  ?  Packs/day: 0.25  ?  Years: 5.00  ?  Pack years: 1.25  ?  Types: Cigarettes  ?  Quit date: 06/26/2003  ?  Years since quitting: 18.4  ? Smokeless tobacco: Never

## 2021-12-01 ENCOUNTER — Other Ambulatory Visit: Payer: Self-pay | Admitting: Orthopedic Surgery

## 2021-12-01 DIAGNOSIS — G894 Chronic pain syndrome: Secondary | ICD-10-CM

## 2021-12-01 DIAGNOSIS — S93491A Sprain of other ligament of right ankle, initial encounter: Secondary | ICD-10-CM

## 2021-12-01 DIAGNOSIS — S93601D Unspecified sprain of right foot, subsequent encounter: Secondary | ICD-10-CM

## 2021-12-01 NOTE — Telephone Encounter (Signed)
See below for message from pharmacy ?

## 2021-12-01 NOTE — Telephone Encounter (Signed)
Kristen from [facility] called at 2:35 pm left voicemail wanting to know if you can call in a new script they do not have the Percocet in stock and for right now they have the Oxycodone in stock but for how long they don't know  ? ? ?Please call the drug store back.  ?[phone]  ?

## 2021-12-01 NOTE — Telephone Encounter (Signed)
Patient requests refill: ?oxyCODONE-acetaminophen (PERCOCET) 10-325 MG tablet 30 tablet  ?     [name], [place] ?

## 2021-12-01 NOTE — Telephone Encounter (Signed)
Request sent to provider.

## 2021-12-02 ENCOUNTER — Other Ambulatory Visit: Payer: Self-pay | Admitting: Orthopedic Surgery

## 2021-12-02 MED ORDER — OXYCODONE HCL 5 MG PO TABS
ORAL_TABLET | ORAL | 0 refills | Status: DC
Start: 2021-12-02 — End: 2021-12-08

## 2021-12-02 NOTE — Progress Notes (Signed)
Meds ordered this encounter  ?Medications  ? oxyCODONE (OXY IR/ROXICODONE) 5 MG immediate release tablet  ?  Sig: 2 every 4 hrs as needed for severe pain  ?  Dispense:  20 tablet  ?  Refill:  0  ? ? ?

## 2021-12-08 ENCOUNTER — Other Ambulatory Visit: Payer: Self-pay

## 2021-12-08 MED ORDER — OXYCODONE HCL 5 MG PO TABS: ORAL_TABLET | ORAL | 0 refills | Status: DC

## 2021-12-08 NOTE — Telephone Encounter (Signed)
Oxycodone 5 MG Immediate Release Tablet ? ?Take 2 tablets every 4 hours as needed for severe pain. ? ?PATIENT USES [name] IN [place] ?

## 2021-12-08 NOTE — Telephone Encounter (Signed)
Refill request sent to provider.

## 2021-12-15 ENCOUNTER — Other Ambulatory Visit: Payer: Self-pay

## 2021-12-15 ENCOUNTER — Other Ambulatory Visit: Payer: Self-pay | Admitting: Orthopedic Surgery

## 2021-12-15 DIAGNOSIS — G894 Chronic pain syndrome: Secondary | ICD-10-CM

## 2021-12-15 DIAGNOSIS — S93491A Sprain of other ligament of right ankle, initial encounter: Secondary | ICD-10-CM

## 2021-12-15 DIAGNOSIS — S93601D Unspecified sprain of right foot, subsequent encounter: Secondary | ICD-10-CM

## 2021-12-15 MED ORDER — OXYCODONE-ACETAMINOPHEN 7.5-325 MG PO TABS: 1.0000 | ORAL_TABLET | ORAL | 0 refills | Status: DC | PRN

## 2021-12-15 NOTE — Progress Notes (Signed)
Meds ordered this encounter  Medications   oxyCODONE-acetaminophen (PERCOCET) 7.5-325 MG tablet    Sig: Take 1 tablet by mouth every 4 (four) hours as needed for up to 5 days for severe pain.    Dispense:  30 tablet    Refill:  0    

## 2021-12-15 NOTE — Telephone Encounter (Signed)
Request sent to provider.

## 2021-12-15 NOTE — Telephone Encounter (Signed)
Patient made aware that provider was not going to refill the oxycodone 10mg . He did send in Oxy 7.5. Patient in agreement with treatment plan.  ?

## 2021-12-15 NOTE — Telephone Encounter (Signed)
Oxycodone-Acetaminophen 10/325 MG  Qty 30 Tablets ? ?Take 1 tablet by mouth every 4 (four) hours as needed for up to 5 days for severe pain ? ? ?[name] [place] ?

## 2021-12-15 NOTE — Telephone Encounter (Signed)
She doesn't need 10 mg anymore  ? ?

## 2021-12-22 ENCOUNTER — Telehealth: Payer: Self-pay

## 2021-12-22 ENCOUNTER — Other Ambulatory Visit: Payer: Self-pay

## 2021-12-22 MED ORDER — OXYCODONE-ACETAMINOPHEN 7.5-325 MG PO TABS: 1.0000 | ORAL_TABLET | ORAL | 0 refills | Status: DC | PRN

## 2021-12-22 NOTE — Telephone Encounter (Signed)
Request sent to provider.

## 2021-12-22 NOTE — Telephone Encounter (Signed)
Oxycodone-Acetaminophen 7.5/325 MG  Qty 30 Tablets ? ?Take 1 tablet by mouth every 4 (four) hours as needed for up to 5 days for severe pain. ? ?PATIENT USES [facility] IN [place] ?

## 2021-12-26 ENCOUNTER — Other Ambulatory Visit: Payer: Self-pay | Admitting: Gastroenterology

## 2021-12-26 ENCOUNTER — Other Ambulatory Visit (HOSPITAL_COMMUNITY): Payer: Self-pay | Admitting: Psychiatry

## 2021-12-26 DIAGNOSIS — K219 Gastro-esophageal reflux disease without esophagitis: Secondary | ICD-10-CM

## 2021-12-26 NOTE — Telephone Encounter (Signed)
Last ov 08/24/20

## 2021-12-29 ENCOUNTER — Other Ambulatory Visit: Payer: Self-pay | Admitting: Orthopedic Surgery

## 2021-12-30 ENCOUNTER — Encounter: Payer: Self-pay | Admitting: Gastroenterology

## 2021-12-30 ENCOUNTER — Ambulatory Visit (INDEPENDENT_AMBULATORY_CARE_PROVIDER_SITE_OTHER): Payer: Medicare Other | Admitting: Gastroenterology

## 2021-12-30 VITALS — BP 108/60 | HR 76 | Temp 97.7°F | Ht 63.0 in | Wt 208.8 lb

## 2021-12-30 DIAGNOSIS — K5900 Constipation, unspecified: Secondary | ICD-10-CM | POA: Diagnosis not present

## 2021-12-30 DIAGNOSIS — R1032 Left lower quadrant pain: Secondary | ICD-10-CM | POA: Diagnosis not present

## 2021-12-30 DIAGNOSIS — R1310 Dysphagia, unspecified: Secondary | ICD-10-CM

## 2021-12-30 DIAGNOSIS — K219 Gastro-esophageal reflux disease without esophagitis: Secondary | ICD-10-CM

## 2021-12-30 DIAGNOSIS — R1319 Other dysphagia: Secondary | ICD-10-CM

## 2021-12-30 MED ORDER — LACTULOSE 10 GM/15ML PO SOLN: 20.0000 g | Freq: Two times a day (BID) | ORAL | 5 refills | Status: AC

## 2021-12-30 MED ORDER — OMEPRAZOLE 20 MG PO CPDR: 20.0000 mg | DELAYED_RELEASE_CAPSULE | Freq: Two times a day (BID) | ORAL | 1 refills | Status: DC

## 2021-12-30 NOTE — Progress Notes (Signed)
? ? ? ?GI Office Note   ? ?Referring Provider: Redmond School, MD ?Primary Care Physician:  Redmond School, MD  ?Primary Gastroenterologist: Garfield Cornea, MD ? ? ?Chief Complaint  ? ?Chief Complaint  ?Patient presents with  ? Follow-up  ?  Pt complains of lower left abd pain  ? ? ?History of Present Illness  ? ?Diane [name] is a 71 y.o. female presenting today for follow up. Last seen 08/2020. History of GERD, constipation, dysphagia, colonic adenomas, normocytic anemia.  ? ?Historically has had difficult to manage constipation. At time of last ov she was on Symproic and Amitiza. She does not seem clear on what she is currently taking. Called the pharmacy and last Symproic RX refilled 09/02/21. She filled Amitiza last week but before that it had been 07/2021. She says she is taking Amitiza BID and Symproic on days she takes pain meds. She states she is also taking miralax. Still having trouble with irregular BMs. No melena, brbpr. She has had some ongoing LLQ pain still.  ? ?She had trouble once with two small pills not wanting to go down. Had to spit back up. No problems swallowing food. She is having heartburn breakthrough symptoms. OJ makes worse. Taking pantoprazole BID.  ? 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 No future colonoscopy needed for screening/surveillance due to age.  ? ?CT A/P with contrast 08/2021:  ?IMPRESSION: ?No acute abnormality seen in the abdomen or pelvis. ?  ?Grossly stable 5.4 cm left adnexal cyst. Recommend follow-up US in ?6-12 months. Note: This recommendation does not apply to ?premenarchal patients and to those with increased risk (genetic, ?family history, elevated tumor markers or other high-risk factors) ?of ovarian cancer. Reference: JACR 2020 Feb; 17(2):248-254 ? 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 ?-Procedure: Laparoscopic lysis of adhesions, extensive, with drainage of peritoneal inclusion  cyst ? ?Medications  ? ?Current Outpatient Medications  ?Medication Sig Dispense Refill  ? ALPRAZolam (XANAX) 0.5 MG tablet Take 1 tablet (0.5 mg total) by mouth 3 (three) times daily. 90 tablet 2  ? atorvastatin (LIPITOR) 20 MG tablet Take 20 mg by mouth daily.    ? cyclobenzaprine (FLEXERIL) 10 MG tablet TAKE ONE TABLET BY MOUTH THREE TIMES DAILY AS NEEDED FOR MUSCLE SPASMS 90 tablet 5  ? divalproex (DEPAKOTE) 125 MG DR tablet TAKE THREE (3) TABLETS BY MOUTH AT BEDTIME 90 tablet 2  ? DULoxetine (CYMBALTA) 60 MG capsule Take 1 capsule (60 mg total) by mouth daily. 30 capsule 2  ? ferrous sulfate 325 (65 FE) MG tablet Take 325 mg by mouth daily with breakfast.    ? hydrocortisone (ANUSOL-HC) 2.5 % rectal cream Place 1 application rectally 2 (two) times daily. (Patient taking differently: Place 1 application. rectally as needed.) 30 g 1  ? ibuprofen (ADVIL) 800 MG tablet Take 1 tablet (800 mg total) by mouth every 8 (eight) hours as needed. 90 tablet 1  ?       ? levothyroxine (SYNTHROID) 100 MCG tablet Take 100 mcg by mouth daily.    ? lisinopril (PRINIVIL,ZESTRIL) 10 MG tablet Take 10 mg by mouth at bedtime.     ? lubiprostone (AMITIZA) 24 MCG capsule TAKE ONE CAPSULE BY MOUTH TWICE DAILY WITH A MEAL 60 capsule 11  ? Naldemedine Tosylate (SYMPROIC) 0.2 MG TABS Take by mouth.    ? omeprazole (PRILOSEC) 20 MG capsule Take 1 capsule (20 mg total) by mouth 2 (two) times daily before a meal. 180 capsule  1  ? oxybutynin (DITROPAN-XL) 10 MG 24 hr tablet Take 10 mg by mouth at bedtime.    ? traZODone (DESYREL) 50 MG tablet Take 1 tablet (50 mg total) by mouth at bedtime. 30 tablet 2  ? meloxicam (MOBIC) 7.5 MG tablet Take 1 tablet (7.5 mg total) by mouth daily. 30 tablet 5  ? oxyCODONE-acetaminophen (PERCOCET) 7.5-325 MG tablet TAKE ONE TABLET BY MOUTH EVERY 4 HOURS AS NEEDED FOR SEVERE PAIN 30 tablet 0  ? ?No current facility-administered medications for this visit.  ? ? ?Allergies  ? ?Allergies as of 12/30/2021 - Review  Complete 12/30/2021  ?Allergen Reaction Noted  ? Neomycin-bacitracin zn-polymyx Other (See Comments) 02/27/2008  ? Penicillins Hives   ? Sulfonamide derivatives Hives and Swelling 02/27/2008  ? ?  ?Past Medical History  ? ?Past Medical History:  ?Diagnosis Date  ? Allergic rhinitis   ? Anxiety   ? Arthritis   ? Cataracts, bilateral   ? Chronic back pain   ? Sees pain [facility] clinic  ? Chronic pain syndrome   ? Constipation   ? Depression   ? Essential hypertension   ? GERD (gastroesophageal reflux disease)   ? Headaches, cluster   ? History of seizures   ? Hypothyroid   ? Mania ([place])   ? Mixed hyperlipidemia   ? PTSD (post-traumatic stress disorder)   ? Right lumbar radiculopathy   ? Sleep apnea   ? STOP BANG score=4  ? 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[facility])   ? ? ?Past Surgical History  ? ?Past Surgical History:  ?Procedure Laterality Date  ? BACK SURGERY    ? BILATERAL OOPHORECTOMY    ? CATARACT EXTRACTION W/PHACO Left 01/04/2016  ? Procedure: CATARACT EXTRACTION PHACO AND INTRAOCULAR LENS PLACEMENT (IOC);  Surgeon: Rutherford Guys, MD;  [facility]: [facility];  Service: Ophthalmology;  Laterality: Left;  CDE:4.70  ? CATARACT EXTRACTION W/PHACO Right 01/18/2016  ? Procedure: CATARACT EXTRACTION PHACO AND INTRAOCULAR LENS PLACEMENT RIGHT EYE; CDE:  4.66;  Surgeon: Rutherford Guys, MD;  Location: [facility];  Service: Ophthalmology;  Laterality: Right;  ? CHOLECYSTECTOMY    ? COLONOSCOPY  10/16/2008  ? [id number], otherwise normal rectum; pancolonic diverticula the remainder of the colonic mucosa appeared normal. Next TCS due 10/2013.  ? COLONOSCOPY  06/28/2006  ? RMR: Internal hemorrhoids. Diminutive rectal polyps, cold biopsied/ Pancolonic diverticula. Polyps in the right colon removed   ? COLONOSCOPY WITH PROPOFOL N/A 05/20/2015  ? RMR: Pancolonic diverticulosis. Redundant colon   ? COLONOSCOPY WITH PROPOFOL N/A 11/11/2020  ? Procedure: COLONOSCOPY WITH PROPOFOL;  Surgeon: Daneil Dolin, MD;  [facility];  Service:  Endoscopy;  Laterality: N/A;  pm ASA 2  ? ESOPHAGOGASTRODUODENOSCOPY  10/16/2008  ? [id number] hiatal hernia otherwise normal esophagus, stomach  D1, D2, status post passage of a 56-French Maloney dilator  ? ESOPHAGOGASTRODUODENOSCOPY  06/28/2006  ? [id number] esophagus. Small hiatal hernia. Otherwise normal stomach, D1 and D2, status post passage of a 56 [place] Maloney dilator  ? ESOPHAGOGASTRODUODENOSCOPY (EGD) WITH ESOPHAGEAL DILATION N/A 07/14/2013  ? RMR: Abnormal esophagus of uncertain significance-status post esophageal biospy. small hiatal hernia.   ? ESOPHAGOGASTRODUODENOSCOPY (EGD) WITH PROPOFOL N/A 05/20/2015  ? RMR: NORMAL EGD status post maloney dilation   ? ESOPHAGOGASTRODUODENOSCOPY (EGD) WITH PROPOFOL N/A 12/23/2018  ? Dr. Gala Romney: normal esophagus, s/p esophageal dilation.   ? FOOT SURGERY    ? left foot-  ? INCISION AND DRAINAGE Right 01/03/2013  ? Procedure: INCISION AND DRAINAGE;  Surgeon: Carole Civil,  MD;  Location: [facility];  Service: Orthopedics;  Laterality: Right;  ? KNEE ARTHROSCOPY  01/2012  ? right  ? KNEE ARTHROSCOPY WITH LATERAL RELEASE Right 01/03/2013  ? Procedure: Lateral Release Patella Right Knee;  Surgeon: Carole Civil, MD;  Location: [facility];  Service: Orthopedics;  Laterality: Right;  ? LAPAROSCOPIC LYSIS OF ADHESIONS N/A 11/12/2019  ? Procedure: LAPAROSCOPIC LYSIS OF ADHESIONS, extensive; drainage of peritoneal cyst;  Surgeon: Florian Buff, MD;  Location: [facility];  Service: Gynecology;  Laterality: N/A;  ? LUMBAR DISC SURGERY  L4-5  ? MALONEY DILATION N/A 05/20/2015  ? Procedure: MALONEY DILATION;  Surgeon: Daneil Dolin, MD;  Location: [facility];  Service: Endoscopy;  Laterality: N/AVenia Minks dilator # 65  ? MALONEY DILATION N/A 12/23/2018  ? Procedure: MALONEY DILATION;  Surgeon: Daneil Dolin, MD;  [facility];  Service: Endoscopy;  Laterality: N/A;  ? PARTIAL HYSTERECTOMY    ? TOTAL KNEE ARTHROPLASTY  07/01/2012  ? Procedure: TOTAL KNEE ARTHROPLASTY;  Surgeon:  Carole Civil, MD;  Location: [facility];  Service: Orthopedics;  Laterality: Right;  ? TOTAL KNEE REVISION Right 01/03/2013  ? Procedure: Patellaplasty Right Knee;  Surgeon: Carole Civil, MD;  Location:

## 2021-12-30 NOTE — Patient Instructions (Signed)
Stop pantoprazole. Start omeprazole 20mg  twice daily before breakfast and evening meal. Prescriptions sent to pharmacy. ?Continue Linzess and Symproic. ?Add lactulose 30 mL (2 tablespoons) twice daily for constipation. Prescription sent to pharmacy.  ?Xray of esophagus due to swallowing issues.  ?I will let Dr. know about last CT scan findings, regarding the cyst in the pelvis. ?Return office visit in six months.  ?

## 2022-01-05 ENCOUNTER — Encounter: Payer: Self-pay | Admitting: Orthopedic Surgery

## 2022-01-05 ENCOUNTER — Ambulatory Visit (INDEPENDENT_AMBULATORY_CARE_PROVIDER_SITE_OTHER): Payer: Medicare Other | Admitting: Orthopedic Surgery

## 2022-01-05 VITALS — Ht 63.0 in | Wt 208.0 lb

## 2022-01-05 DIAGNOSIS — G894 Chronic pain syndrome: Secondary | ICD-10-CM

## 2022-01-05 DIAGNOSIS — S92344G Nondisplaced fracture of fourth metatarsal bone, right foot, subsequent encounter for fracture with delayed healing: Secondary | ICD-10-CM

## 2022-01-05 DIAGNOSIS — S92324G Nondisplaced fracture of second metatarsal bone, right foot, subsequent encounter for fracture with delayed healing: Secondary | ICD-10-CM

## 2022-01-05 DIAGNOSIS — S93601D Unspecified sprain of right foot, subsequent encounter: Secondary | ICD-10-CM

## 2022-01-05 DIAGNOSIS — S92334G Nondisplaced fracture of third metatarsal bone, right foot, subsequent encounter for fracture with delayed healing: Secondary | ICD-10-CM

## 2022-01-05 MED ORDER — OXYCODONE-ACETAMINOPHEN 7.5-325 MG PO TABS: ORAL_TABLET | ORAL | 0 refills | Status: DC

## 2022-01-05 MED ORDER — MELOXICAM 7.5 MG PO TABS: 7.5000 mg | ORAL_TABLET | Freq: Every day | ORAL | 5 refills | Status: DC

## 2022-01-05 NOTE — Progress Notes (Signed)
FOLLOW UP  ? ?Encounter Diagnoses  ?Name Primary?  ? Chronic pain syndrome Yes  ? Sprain of right foot, subsequent encounter   ? Closed nondisplaced fracture of second metatarsal bone of right foot with delayed healing, subsequent encounter   ? Nondisplaced fracture of third metatarsal bone, right foot, subsequent encounter for fracture with delayed healing   ? Closed nondisplaced fracture of fourth metatarsal bone of right foot with delayed healing, subsequent encounter   ? ? ? ?Chief Complaint  ?Patient presents with  ? Fracture  ?  Rt foot DOI 09/06/21  ? Medication Refill  ?  Oxycodone  ? ? ? ?[name] came in today for reevaluation of the injuries mentioned above these were only found on further diagnostic imaging ? ?She wore a cast for 6 weeks ? ?She says the foot feels better ? ?We took the cast off ? ?Other than some dry skin the foot has no swelling no tenderness I recommend regular shoewear I refilled her chronic pain medication she will follow-up with [place] as needed ? ?Meds ordered this encounter  ?Medications  ? meloxicam (MOBIC) 7.5 MG tablet  ?  Sig: Take 1 tablet (7.5 mg total) by mouth daily.  ?  Dispense:  30 tablet  ?  Refill:  5  ? oxyCODONE-acetaminophen (PERCOCET) 7.5-325 MG tablet  ?  Sig: TAKE ONE TABLET BY MOUTH EVERY 4 HOURS AS NEEDED FOR SEVERE PAIN  ?  Dispense:  30 tablet  ?  Refill:  0  ? ? ?

## 2022-01-05 NOTE — Patient Instructions (Signed)
Follow up as needed

## 2022-01-09 ENCOUNTER — Telehealth: Payer: Self-pay | Admitting: Gastroenterology

## 2022-01-09 DIAGNOSIS — N949 Unspecified condition associated with female genital organs and menstrual cycle: Secondary | ICD-10-CM

## 2022-01-09 NOTE — Telephone Encounter (Signed)
Please refer back to gyn for left adnexal cyst. 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 Recent CT 08/2021 recommending surveillance u/s.  ?

## 2022-01-09 NOTE — Telephone Encounter (Signed)
Referral sent to GYN via Epic. ?

## 2022-01-09 NOTE — Addendum Note (Signed)
Addended by: Corrie Mckusick on: 01/09/2022 03:53 PM ? ? Modules accepted: Orders ? ?

## 2022-01-10 ENCOUNTER — Telehealth: Payer: Self-pay | Admitting: Radiology

## 2022-01-10 NOTE — Telephone Encounter (Signed)
Patient called said her foot is still painful, swollen and she wants to know what Dr Aline Brochure recommends to do.  She says she is elevating above heart level.  Please call.   ?

## 2022-01-10 NOTE — Telephone Encounter (Signed)
Continue elevation  ? ?Take the medicine as ordered  ? ?Schedule appt for next week

## 2022-01-11 NOTE — Telephone Encounter (Signed)
Spoke with patient. Advised her to continue to elevate foot, take meds as prescribed, and scheduled her to come in and see Dr.Harrison on Monday 01/16/22 at 12pm ?

## 2022-01-12 ENCOUNTER — Telehealth: Payer: Self-pay | Admitting: Orthopedic Surgery

## 2022-01-12 ENCOUNTER — Other Ambulatory Visit: Payer: Self-pay

## 2022-01-12 MED ORDER — OXYCODONE-ACETAMINOPHEN 7.5-325 MG PO TABS: ORAL_TABLET | ORAL | 0 refills | Status: DC

## 2022-01-12 NOTE — Telephone Encounter (Signed)
Request sent to provider.

## 2022-01-12 NOTE — Telephone Encounter (Signed)
Done

## 2022-01-12 NOTE — Telephone Encounter (Signed)
Patient requests refill:  ?oxyCODONE-acetaminophen (PERCOCET) 7.5-325 MG tablet 30 tablet  ?     [name], [place], [place] ?

## 2022-01-16 ENCOUNTER — Ambulatory Visit (INDEPENDENT_AMBULATORY_CARE_PROVIDER_SITE_OTHER): Payer: Medicare Other | Admitting: Orthopedic Surgery

## 2022-01-16 ENCOUNTER — Ambulatory Visit (INDEPENDENT_AMBULATORY_CARE_PROVIDER_SITE_OTHER): Payer: Medicare Other

## 2022-01-16 DIAGNOSIS — S92344G Nondisplaced fracture of fourth metatarsal bone, right foot, subsequent encounter for fracture with delayed healing: Secondary | ICD-10-CM | POA: Diagnosis not present

## 2022-01-16 DIAGNOSIS — S93601D Unspecified sprain of right foot, subsequent encounter: Secondary | ICD-10-CM | POA: Diagnosis not present

## 2022-01-16 DIAGNOSIS — S92334G Nondisplaced fracture of third metatarsal bone, right foot, subsequent encounter for fracture with delayed healing: Secondary | ICD-10-CM

## 2022-01-16 DIAGNOSIS — S92324G Nondisplaced fracture of second metatarsal bone, right foot, subsequent encounter for fracture with delayed healing: Secondary | ICD-10-CM

## 2022-01-16 DIAGNOSIS — G894 Chronic pain syndrome: Secondary | ICD-10-CM

## 2022-01-16 MED ORDER — OXYCODONE-ACETAMINOPHEN 7.5-325 MG PO TABS: 1.0000 | ORAL_TABLET | ORAL | 0 refills | Status: DC | PRN

## 2022-01-16 NOTE — Progress Notes (Signed)
FOLLOW UP  ? ?Encounter Diagnoses  ?Name Primary?  ? Chronic pain syndrome   ? Sprain of right foot, subsequent encounter   ? Closed nondisplaced fracture of second metatarsal bone of right foot with delayed healing, subsequent encounter   ? Nondisplaced fracture of third metatarsal bone, right foot, subsequent encounter for fracture with delayed healing   ? Closed nondisplaced fracture of fourth metatarsal bone of right foot with delayed healing, subsequent encounter Yes  ? ? ? ?Chief Complaint  ?Patient presents with  ? Pain  ?  Rt foot DOI 09/06/21, started hurting again last week ? ?  ? ? ? ?Recently treated for multiple fractures in the foot best diagnosed by MRI after plain films did not show all of the injuries ? ?We put her in a boot after that a cast seem to be getting better but last week pain started getting worse again in the midfoot and lateral ankle ? ?Examination shows swelling across the dorsum of the foot with swelling around the anterolateral ankle stable drawer stable inversion test tenderness at the base of the metatarsals ? ?Repeat imaging ? ?Lisfranc joint is intact well aligned Lisfranc joint are lining up perfectly no displacement angulation there is some osteopenia ? ?Recommendations ? ?SEMIRIGID ORTHOTICS  ? ?X 4 WKS  ? ?Meds ordered this encounter  ?Medications  ? oxyCODONE-acetaminophen (PERCOCET) 7.5-325 MG tablet  ?  Sig: Take 1 tablet by mouth every 4 (four) hours as needed for up to 5 days for severe pain. TAKE ONE TABLET BY MOUTH EVERY 4 HOURS AS NEEDED FOR SEVERE PAIN  ?  Dispense:  30 tablet  ?  Refill:  0  ? ? ? ? ?

## 2022-01-23 ENCOUNTER — Telehealth: Payer: Self-pay | Admitting: Orthopedic Surgery

## 2022-01-23 NOTE — Telephone Encounter (Signed)
[facility] called to ask about the orthotics that Dr Romeo Apple recommended, per last visit on 01/16/22: "SEMIRIGID ORTHOTICS" - please advise.  ?

## 2022-01-24 ENCOUNTER — Ambulatory Visit (INDEPENDENT_AMBULATORY_CARE_PROVIDER_SITE_OTHER): Payer: Medicare Other | Admitting: Obstetrics & Gynecology

## 2022-01-24 ENCOUNTER — Encounter: Payer: Self-pay | Admitting: Obstetrics & Gynecology

## 2022-01-24 VITALS — BP 151/74 | HR 71 | Ht 63.0 in | Wt 211.4 lb

## 2022-01-24 DIAGNOSIS — Z9889 Other specified postprocedural states: Secondary | ICD-10-CM

## 2022-01-24 DIAGNOSIS — R102 Pelvic and perineal pain: Secondary | ICD-10-CM | POA: Diagnosis not present

## 2022-01-24 DIAGNOSIS — Z90721 Acquired absence of ovaries, unilateral: Secondary | ICD-10-CM

## 2022-01-24 DIAGNOSIS — Z90710 Acquired absence of both cervix and uterus: Secondary | ICD-10-CM

## 2022-01-24 DIAGNOSIS — E669 Obesity, unspecified: Secondary | ICD-10-CM

## 2022-01-24 DIAGNOSIS — N9489 Other specified conditions associated with female genital organs and menstrual cycle: Secondary | ICD-10-CM

## 2022-01-24 NOTE — Telephone Encounter (Signed)
Spoke with patient. She states that [facility] in [place] and they said they didn't do them anymore. Advised her to go to [facility] in [place] to see if they have them. If they don't she will call the office back.  ?

## 2022-01-24 NOTE — Telephone Encounter (Signed)
Can patient buy the semirigid orthotics online such as [employer] or do we need to fax a script over to somewhere? ? ?-Abby ?

## 2022-01-24 NOTE — Telephone Encounter (Signed)
Hse was supoosed to got to [facility]

## 2022-01-24 NOTE — Progress Notes (Signed)
? ?  GYN VISIT ?Patient name: Diane [name] MRN [id number]  Date of birth: December 08, 1950 ?Chief Complaint:   ?constipated (Has a BM once every 2 weeks; pain in left lower stomach for a while) ? ?History of Present Illness:   ?Diane [name] is a 71 y.o. G2P2002 PM, PH female being seen today for the following concerns: ? ?Adnexal mass: This has been an ongoing issue for about 2 years with pelvic pain on her left side.  Pain is sharp cramping pain.  8/10.  Pain comes every other day.  Pain does not radiate.  She also struggles with constipation not sure if pain is related to her constipation. ?Last [place] 08/2021 cyst stable in size 5.7cm. ? ?Records reviewed- previously seen by Dr. Despina Hidden in 2021- s/p Laparoscopic procedure 11/2019- severe pelvic adhesions notes, drainage  ? ? ?No LMP recorded. Patient has had a hysterectomy. ? ? ?  01/24/2022  ?  2:56 PM 11/28/2021  ?  9:12 AM 10/07/2021  ? 10:05 AM  ?Depression screen PHQ 2/9  ?Decreased Interest 0    ?Down, Depressed, Hopeless 0    ?PHQ - 2 Score 0    ?Altered sleeping 2    ?Tired, decreased energy 3    ?Change in appetite 3    ?Feeling bad or failure about yourself  0    ?Trouble concentrating 0    ?Moving slowly or fidgety/restless 0    ?Suicidal thoughts 0    ?PHQ-9 Score 8    ?Difficult doing work/chores     ?  ? Information is confidential and restricted. Go to Review Flowsheets to unlock data.  ? ? ? ?Review of Systems:   ?Pertinent items are noted in HPI ?Denies fever/chills, dizziness, headaches, visual disturbances, fatigue, shortness of breath, chest pain, abdominal pain, vomiting. ?Pertinent History Reviewed:  ?Reviewed past medical,surgical, social, obstetrical and family history.  ?Reviewed problem list, medications and allergies. ?Physical Assessment:  ? ?Vitals:  ? 01/24/22 1436  ?BP: (!) 151/74  ?Pulse: 71  ?Weight: 211 lb 6.4 oz (95.9 kg)  ?Height: 5\' 3"  (1.6 m)  ?Body mass index is 37.45 kg/m?. ? ?     Physical Examination:  ? General appearance: alert, well  appearing, and in no distress ? Psych: mood appropriate, normal affect ? Skin: warm & dry  ? Cardiovascular: normal heart rate noted ? Respiratory: normal respiratory effort, no distress ? Abdomen: soft, no rebound, no guarding, +LLQ tenderness ? Pelvic: VULVA: normal appearing vulva with no masses, tenderness or lesions, on bimanual exam- pt reported left-sided tenderness- no masses or abnormalities appreciated ? Extremities: no edema  ? ?Chaperone:  Dr.    ? ?Assessment & Plan:  ?1) Left adnexal mass/inclusion cyst ?-Inclusion cyst remains present, last imaging Dec 2022 ?-reassured that based on prior imaging and surgery do not think this is cancer ?-discussed that based on prior op note, strong concern for adhesions and inability to successfully remove cyst ?-additionally Dr. Jan 2023 note reviewed and do not recommend surgical intervention.  Discussed with surgery there is strong risk for complications including bowel injury requiring further intervention ?-may consider referral to [facility] ?-plan for Forestine Chute next available and will review at that time ? ? ?[place], DO ?Attending Obstetrician & Gynecologist, Faculty Practice ?[facility] ? ? ? ?

## 2022-01-26 ENCOUNTER — Telehealth: Payer: Self-pay | Admitting: *Deleted

## 2022-01-26 ENCOUNTER — Other Ambulatory Visit: Payer: Self-pay | Admitting: Orthopedic Surgery

## 2022-01-26 ENCOUNTER — Other Ambulatory Visit (HOSPITAL_COMMUNITY): Payer: Self-pay | Admitting: Psychiatry

## 2022-01-26 DIAGNOSIS — T50915A Adverse effect of multiple unspecified drugs, medicaments and biological substances, initial encounter: Secondary | ICD-10-CM | POA: Diagnosis not present

## 2022-01-26 DIAGNOSIS — G8929 Other chronic pain: Secondary | ICD-10-CM | POA: Diagnosis not present

## 2022-01-26 DIAGNOSIS — G894 Chronic pain syndrome: Secondary | ICD-10-CM

## 2022-01-26 DIAGNOSIS — K5903 Drug induced constipation: Secondary | ICD-10-CM | POA: Diagnosis not present

## 2022-01-26 DIAGNOSIS — M1711 Unilateral primary osteoarthritis, right knee: Secondary | ICD-10-CM

## 2022-01-26 DIAGNOSIS — E063 Autoimmune thyroiditis: Secondary | ICD-10-CM | POA: Diagnosis not present

## 2022-01-26 DIAGNOSIS — M84474S Pathological fracture, right foot, sequela: Secondary | ICD-10-CM | POA: Diagnosis not present

## 2022-01-26 DIAGNOSIS — E039 Hypothyroidism, unspecified: Secondary | ICD-10-CM | POA: Diagnosis not present

## 2022-01-26 NOTE — Telephone Encounter (Signed)
Patient called back to relay she now cannot find the prescription for the orthotics for [employer]. May she have it rewritten? Said she will come this afternoon to pick up; states she is in the area.

## 2022-01-26 NOTE — Telephone Encounter (Signed)
Patient called in. Needs her BPE scheduled.  Called and got scheduled. Pt aware of appt details

## 2022-01-27 ENCOUNTER — Other Ambulatory Visit: Payer: Self-pay

## 2022-01-27 DIAGNOSIS — S92344G Nondisplaced fracture of fourth metatarsal bone, right foot, subsequent encounter for fracture with delayed healing: Secondary | ICD-10-CM

## 2022-01-27 DIAGNOSIS — S93491A Sprain of other ligament of right ankle, initial encounter: Secondary | ICD-10-CM

## 2022-01-27 DIAGNOSIS — S93621D Sprain of tarsometatarsal ligament of right foot, subsequent encounter: Secondary | ICD-10-CM

## 2022-01-27 DIAGNOSIS — S92334G Nondisplaced fracture of third metatarsal bone, right foot, subsequent encounter for fracture with delayed healing: Secondary | ICD-10-CM

## 2022-01-27 DIAGNOSIS — S92324G Nondisplaced fracture of second metatarsal bone, right foot, subsequent encounter for fracture with delayed healing: Secondary | ICD-10-CM

## 2022-01-27 NOTE — Telephone Encounter (Signed)
New dme order faxed to [facility] or [facility] orthotics

## 2022-01-30 ENCOUNTER — Other Ambulatory Visit: Payer: Self-pay | Admitting: Radiology

## 2022-01-30 MED ORDER — OXYCODONE-ACETAMINOPHEN 7.5-325 MG PO TABS: 1.0000 | ORAL_TABLET | ORAL | 0 refills | Status: DC | PRN

## 2022-01-31 ENCOUNTER — Ambulatory Visit (HOSPITAL_COMMUNITY): Payer: Medicare Other

## 2022-02-07 ENCOUNTER — Other Ambulatory Visit: Payer: Self-pay

## 2022-02-07 ENCOUNTER — Other Ambulatory Visit: Payer: Self-pay | Admitting: Orthopedic Surgery

## 2022-02-07 NOTE — Telephone Encounter (Signed)
Oxycodone-Acetaminophen 7.5/325 MG  Qty 30 Tablets  Take 1 tablet by mouth every 4 (four) hours as needed for severe pain.  PATIENT USES [facility] IN [place]

## 2022-02-16 ENCOUNTER — Ambulatory Visit (INDEPENDENT_AMBULATORY_CARE_PROVIDER_SITE_OTHER): Payer: Medicare Other | Admitting: Orthopedic Surgery

## 2022-02-16 DIAGNOSIS — M79671 Pain in right foot: Secondary | ICD-10-CM | POA: Diagnosis not present

## 2022-02-16 DIAGNOSIS — S92324G Nondisplaced fracture of second metatarsal bone, right foot, subsequent encounter for fracture with delayed healing: Secondary | ICD-10-CM

## 2022-02-16 DIAGNOSIS — G894 Chronic pain syndrome: Secondary | ICD-10-CM | POA: Diagnosis not present

## 2022-02-16 DIAGNOSIS — S93491A Sprain of other ligament of right ankle, initial encounter: Secondary | ICD-10-CM | POA: Diagnosis not present

## 2022-02-16 DIAGNOSIS — G8929 Other chronic pain: Secondary | ICD-10-CM | POA: Diagnosis not present

## 2022-02-16 DIAGNOSIS — S92334G Nondisplaced fracture of third metatarsal bone, right foot, subsequent encounter for fracture with delayed healing: Secondary | ICD-10-CM

## 2022-02-16 DIAGNOSIS — S92344G Nondisplaced fracture of fourth metatarsal bone, right foot, subsequent encounter for fracture with delayed healing: Secondary | ICD-10-CM

## 2022-02-16 DIAGNOSIS — S93621D Sprain of tarsometatarsal ligament of right foot, subsequent encounter: Secondary | ICD-10-CM | POA: Diagnosis not present

## 2022-02-16 MED ORDER — OXYCODONE-ACETAMINOPHEN 7.5-325 MG PO TABS: ORAL_TABLET | ORAL | 0 refills | Status: DC

## 2022-02-16 NOTE — Progress Notes (Signed)
FOLLOW UP   Encounter Diagnoses  Name Primary?   Chronic pain syndrome Yes   Chronic foot pain, right    Closed nondisplaced fracture of second metatarsal bone of right foot with delayed healing, subsequent encounter    Nondisplaced fracture of third metatarsal bone, right foot, subsequent encounter for fracture with delayed healing    Closed nondisplaced fracture of fourth metatarsal bone of right foot with delayed healing, subsequent encounter    Sprain of anterior talofibular ligament of right ankle, initial encounter    Sprain of tarsometatarsal ligament of right foot, subsequent encounter      Chief Complaint  Patient presents with   Follow-up    Recheck on right foot, DOI 09-06-21.    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  After being treated for sprain of the ankle she eventually had further imaging studies which included an MRI of the right foot which was done on November 17, 2021  On that imaging sequence she had multiple fractures at the base of the second through the fourth metatarsals which was described as likely due to a combination of posttraumatic arthritis and reactive edema/stress changes to the tarsometatarsal joints with mild dorsal subluxation of the second and third tarsometatarsal joints and a chronic partial tear of Lisfranc ligament  She was subsequently treated with a cast and a cam walking boot and then placed in rigid orthotics but continues to have pain across Lisfranc joint  Exam shows tenderness across the tarsometatarsal joint starting in the medial to the lateral side of the foot with some dorsal swelling although there is no motion at the tarsometatarsal joint  As I discussed with her we have tried everything that I know to do at this  point.  Recommend she see a foot and ankle surgeon for possible TMT fusion   Meds ordered this encounter  Medications   oxyCODONE-acetaminophen (PERCOCET) 7.5-325 MG tablet    Sig: TAKE ONE TABLET BY MOUTH EVERY 4 HOURS AS NEEDED FOR SEVERE PAIN    Dispense:  30 tablet    Refill:  0

## 2022-02-23 ENCOUNTER — Other Ambulatory Visit: Payer: Self-pay | Admitting: Orthopedic Surgery

## 2022-02-23 DIAGNOSIS — G894 Chronic pain syndrome: Secondary | ICD-10-CM

## 2022-02-23 MED ORDER — OXYCODONE-ACETAMINOPHEN 7.5-325 MG PO TABS: ORAL_TABLET | ORAL | 0 refills | Status: DC

## 2022-02-23 NOTE — Telephone Encounter (Signed)
Patient called for refill: oxyCODONE-acetaminophen (PERCOCET) 7.5-325 MG tablet 30 tablet      [name], [place], [place]

## 2022-02-27 ENCOUNTER — Other Ambulatory Visit: Payer: Self-pay | Admitting: Gastroenterology

## 2022-02-27 ENCOUNTER — Other Ambulatory Visit (HOSPITAL_COMMUNITY): Payer: Self-pay | Admitting: Psychiatry

## 2022-02-27 DIAGNOSIS — K219 Gastro-esophageal reflux disease without esophagitis: Secondary | ICD-10-CM

## 2022-02-27 DIAGNOSIS — F3130 Bipolar disorder, current episode depressed, mild or moderate severity, unspecified: Secondary | ICD-10-CM

## 2022-02-27 NOTE — Telephone Encounter (Signed)
Call for appt

## 2022-02-28 ENCOUNTER — Encounter (HOSPITAL_COMMUNITY): Payer: Self-pay | Admitting: Psychiatry

## 2022-02-28 ENCOUNTER — Other Ambulatory Visit: Payer: Self-pay | Admitting: Orthopedic Surgery

## 2022-02-28 ENCOUNTER — Telehealth (INDEPENDENT_AMBULATORY_CARE_PROVIDER_SITE_OTHER): Payer: Medicare Other | Admitting: Psychiatry

## 2022-02-28 DIAGNOSIS — F5105 Insomnia due to other mental disorder: Secondary | ICD-10-CM

## 2022-02-28 DIAGNOSIS — F3130 Bipolar disorder, current episode depressed, mild or moderate severity, unspecified: Secondary | ICD-10-CM | POA: Diagnosis not present

## 2022-02-28 MED ORDER — FLUOXETINE HCL 20 MG PO CAPS: 40.0000 mg | ORAL_CAPSULE | Freq: Every morning | ORAL | 2 refills | Status: DC

## 2022-02-28 MED ORDER — TRAZODONE HCL 50 MG PO TABS
50.0000 mg | ORAL_TABLET | Freq: Every day | ORAL | 2 refills | Status: DC
Start: 2022-02-28 — End: 2022-05-30

## 2022-02-28 MED ORDER — OXYCODONE-ACETAMINOPHEN 7.5-325 MG PO TABS: 1.0000 | ORAL_TABLET | ORAL | 0 refills | Status: DC | PRN

## 2022-02-28 MED ORDER — DIVALPROEX SODIUM 125 MG PO DR TAB: DELAYED_RELEASE_TABLET | ORAL | 2 refills | Status: DC

## 2022-02-28 MED ORDER — ALPRAZOLAM 0.5 MG PO TABS: 0.5000 mg | ORAL_TABLET | Freq: Three times a day (TID) | ORAL | 2 refills | Status: DC

## 2022-02-28 NOTE — Progress Notes (Signed)
Meds ordered this encounter  Medications   oxyCODONE-acetaminophen (PERCOCET) 7.5-325 MG tablet    Sig: Take 1 tablet by mouth every 4 (four) hours as needed for up to 5 days for severe pain.    Dispense:  30 tablet    Refill:  0    For 6/22

## 2022-02-28 NOTE — Progress Notes (Signed)
Virtual Visit via Telephone Note  I connected with Diane [name] on 02/28/22 at  2:00 PM EDT by telephone and verified that I am speaking with the correct person using two identifiers.  Location: Patient: home Provider: office   I discussed the limitations, risks, security and privacy concerns of performing an evaluation and management service by telephone and the availability of in person appointments. I also discussed with the patient that there may be a patient responsible charge related to this service. The patient expressed understanding and agreed to proceed.      I discussed the assessment and treatment plan with the patient. The patient was provided an opportunity to ask questions and all were answered. The patient agreed with the plan and demonstrated an understanding of the instructions.   The patient was advised to call back or seek an in-person evaluation if the symptoms worsen or if the condition fails to improve as anticipated.  I provided 12 minutes of non-face-to-face time during this encounter.   [name], MD  [facility] MD/PA/NP OP Progress Note  02/28/2022 2:15 PM Diane [name]  MRN:  [id number]  Chief Complaint:  Chief Complaint  Patient presents with   Anxiety   Depression   Follow-up   HPI: This patient is a 71 year old black female who is living with an ex-boyfriend in [place].  She is on disability.  The patient returns for follow-up after 3 months.  Last time she told me that she had broken her right ankle.  This still has not healed correctly and her orthopedic is sending her to a [facility] for another opinion.  Despite this she states her mood has been fairly stable.  She denies significant depression or anxiety.  She is sleeping fairly well.  Her energy is not the greatest but she tries to do things around the house.  She is getting along well with her ex-boyfriend.  She has not had any manic symptoms or severe depression or thoughts of self-harm or suicide.   She denies use of drugs or alcohol Visit Diagnosis:    ICD-10-CM   1. 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 Bipolar I disorder, most recent episode depressed (HCC)  F31.30 divalproex (DEPAKOTE) 125 MG DR tablet      Past Psychiatric History: Patient has a history of psychiatric admission in [facility] at the behavioral [facility]. At that time she was using drugs and alcohol. She has been in outpatient care ever since  Past Medical History:  Past Medical History:  Diagnosis Date   Allergic rhinitis    Anxiety    Arthritis    Cataracts, bilateral    Chronic back pain    Sees pain mamagement clinic   Chronic pain syndrome    Constipation    Depression    Essential hypertension    GERD (gastroesophageal reflux disease)    Headaches, cluster    History of seizures    Hypothyroid    Mania (HCC)    Mixed hyperlipidemia    PTSD (post-traumatic stress disorder)    Right lumbar radiculopathy    Sleep apnea    STOP BANG score=4   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[facility])     Past Surgical History:  Procedure Laterality Date   BACK SURGERY     BILATERAL OOPHORECTOMY     CATARACT EXTRACTION W/PHACO Left 01/04/2016   Procedure: CATARACT EXTRACTION PHACO AND INTRAOCULAR LENS PLACEMENT (IOC);  Surgeon: Loraine Leriche  Gershon Crane, MD;  Location: [facility];  Service: Ophthalmology;  Laterality: Left;  CDE:4.70   CATARACT EXTRACTION W/PHACO Right 01/18/2016   Procedure: CATARACT EXTRACTION PHACO AND INTRAOCULAR LENS PLACEMENT RIGHT EYE; CDE:  4.66;  Surgeon: Rutherford Guys, MD;  Location: [facility];  Service: Ophthalmology;  Laterality: Right;   CHOLECYSTECTOMY     COLONOSCOPY  10/16/2008   [id number], otherwise normal rectum; pancolonic diverticula the remainder of the colonic mucosa appeared normal. Next TCS due 10/2013.   COLONOSCOPY  06/28/2006   RMR: Internal hemorrhoids. Diminutive rectal polyps, cold biopsied/ Pancolonic diverticula. Polyps in the right  colon removed    COLONOSCOPY WITH PROPOFOL N/A 05/20/2015   RMR: Pancolonic diverticulosis. Redundant colon    COLONOSCOPY WITH PROPOFOL N/A 11/11/2020   Procedure: COLONOSCOPY WITH PROPOFOL;  Surgeon: Daneil Dolin, MD;  Location: [facility];  Service: Endoscopy;  Laterality: N/A;  pm ASA 2   ESOPHAGOGASTRODUODENOSCOPY  10/16/2008   [id number] hiatal hernia otherwise normal esophagus, stomach  D1, D2, status post passage of a 56-French Maloney dilator   ESOPHAGOGASTRODUODENOSCOPY  06/28/2006   [id number] esophagus. Small hiatal hernia. Otherwise normal stomach, D1 and D2, status post passage of a 56 French Maloney dilator   ESOPHAGOGASTRODUODENOSCOPY (EGD) WITH ESOPHAGEAL DILATION N/A 07/14/2013   RMR: Abnormal esophagus of uncertain significance-status post esophageal biospy. small hiatal hernia.    ESOPHAGOGASTRODUODENOSCOPY (EGD) WITH PROPOFOL N/A 05/20/2015   RMR: NORMAL EGD status post maloney dilation    ESOPHAGOGASTRODUODENOSCOPY (EGD) WITH PROPOFOL N/A 12/23/2018   Dr. Gala Romney: normal esophagus, s/p esophageal dilation.    FOOT SURGERY     left foot-   INCISION AND DRAINAGE Right 01/03/2013   Procedure: INCISION AND DRAINAGE;  Surgeon: Carole Civil, MD;  Location: [facility];  Service: Orthopedics;  Laterality: Right;   KNEE ARTHROSCOPY  01/2012   right   KNEE ARTHROSCOPY WITH LATERAL RELEASE Right 01/03/2013   Procedure: Lateral Release Patella Right Knee;  Surgeon: Carole Civil, MD;  Location: [facility];  Service: Orthopedics;  Laterality: Right;   LAPAROSCOPIC LYSIS OF ADHESIONS N/A 11/12/2019   Procedure: LAPAROSCOPIC LYSIS OF ADHESIONS, extensive; drainage of peritoneal cyst;  Surgeon: Florian Buff, MD;  Location: [facility];  Service: Gynecology;  Laterality: N/A;   LUMBAR DISC SURGERY  L4-5   MALONEY DILATION N/A 05/20/2015   Procedure: Venia Minks DILATION;  Surgeon: Daneil Dolin, MD;  Location: [facility];  Service: Endoscopy;  Laterality: N/AVenia Minks dilator # 90   MALONEY DILATION  N/A 12/23/2018   Procedure: Venia Minks DILATION;  Surgeon: Daneil Dolin, MD;  Location[facility];  Service: Endoscopy;  Laterality: N/A;   PARTIAL HYSTERECTOMY     TOTAL KNEE ARTHROPLASTY  07/01/2012   Procedure: TOTAL KNEE ARTHROPLASTY;  Surgeon: Carole Civil, MD;  Location: [facility];  Service: Orthopedics;  Laterality: Right;   TOTAL KNEE REVISION Right 01/03/2013   Procedure: Patellaplasty Right Knee;  Surgeon: Carole Civil, MD;  Location: [facility];  Service: Orthopedics;  Laterality: Right;    Family Psychiatric History: See below  Family History:  Family History  Problem Relation Age of Onset   Alcohol abuse Father    Anxiety disorder Father    Alcohol abuse Mother    Anxiety disorder Mother    Alcohol abuse Brother    Anxiety disorder Brother    Drug abuse Brother    Drug abuse Brother    Alcohol abuse Brother    Anxiety disorder Brother    Seizures Brother  Alcohol abuse Brother    Anxiety disorder Brother    Seizures Brother    Alcohol abuse Brother    Anxiety disorder Brother    Alcohol abuse Brother    Anxiety disorder Brother    Breast cancer Sister    Bipolar disorder Sister    Breast cancer Sister    Dementia Paternal Aunt    ADD / ADHD Grandchild    ADD / ADHD Grandchild    Heart disease Other    Diabetes Other    Alcohol abuse Other    Thyroid disease Son    Thyroid disease Daughter    Depression Neg Hx    OCD Neg Hx    Paranoid behavior Neg Hx    Schizophrenia Neg Hx    Sexual abuse Neg Hx    Physical abuse Neg Hx    Colon cancer Neg Hx     Social History:  Social History   Socioeconomic History   Marital status: Legally Separated    Spouse name: Not on file   Number of children: Not on file   Years of education: 12th grade   Highest education level: Not on file  Occupational History   Occupation: disabled    Employer: UNEMPLOYED  Tobacco Use   Smoking status: Former    Packs/day: 0.25    Years: 5.00    Total pack years:  1.25    Types: Cigarettes    Quit date: 06/26/2003    Years since quitting: 18.6   Smokeless tobacco: Never  Vaping Use   Vaping Use: Never used  Substance and Sexual Activity   Alcohol use: Yes    Alcohol/week: 0.0 standard drinks of alcohol    Comment: occasionally; once a month.   Drug use: Not Currently    Comment: has a past history of street drug use   Sexual activity: Yes    Birth control/protection: Surgical    Comment: hyst  Other Topics Concern   Not on file  Social History Narrative   Not on file   Social Determinants of Health   Financial Resource Strain: Low Risk  (01/24/2022)   Overall Financial Resource Strain (CARDIA)    Difficulty of Paying Living Expenses: Not hard at all  Food Insecurity: No Food Insecurity (01/24/2022)   Hunger Vital Sign    Worried About Running Out of Food in the Last Year: Never true    Champ in the Last Year: Never true  Transportation Needs: No Transportation Needs (01/24/2022)   PRAPARE - Hydrologist (Medical): No    Lack of Transportation (Non-Medical): No  Physical Activity: Inactive (01/24/2022)   Exercise Vital Sign    Days of Exercise per Week: 0 days    Minutes of Exercise per Session: 0 min  Stress: No Stress Concern Present (01/24/2022)   [place]    Feeling of Stress : Only a little  Social Connections: Socially Isolated (01/24/2022)   Social Connection and Isolation Panel [NHANES]    Frequency of Communication with Friends and Family: Once a week    Frequency of Social Gatherings with Friends and Family: Once a week    Attends Religious Services: 1 to 4 times per year    Active Member of [employer] or Organizations: No    Attends Archivist Meetings: Never    Marital Status: Separated    Allergies:  Allergies  Allergen Reactions   Neomycin-Bacitracin  Zn-Polymyx Other (See Comments)    Reaction: skin starts  to become raw and peels off   Penicillins Hives   Sulfonamide Derivatives Hives and Swelling    Metabolic Disorder Labs: Lab Results  Component Value Date   HGBA1C 5.5 02/19/2020   MPG 111.15 02/19/2020   MPG 120 (H) 01/25/2012   No results found for: "PROLACTIN" Lab Results  Component Value Date   CHOL  12/10/2010    69        ATP III CLASSIFICATION:  <200     mg/dL   Desirable  200-239  mg/dL   Borderline High  >=240    mg/dL   High          TRIG 38 12/10/2010   HDL 28 (L) 12/10/2010   CHOLHDL 2.5 12/10/2010   VLDL 8 12/10/2010   LDLCALC  12/10/2010    33        Total Cholesterol/HDL:CHD Risk Coronary Heart Disease Risk Table                     Men   Women  1/2 Average Risk   3.4   3.3  Average Risk       5.0   4.4  2 X Average Risk   9.6   7.1  3 X Average Risk  23.4   11.0        Use the calculated Patient Ratio above and the CHD Risk Table to determine the patient's CHD Risk.        ATP III CLASSIFICATION (LDL):  <100     mg/dL   Optimal  100-129  mg/dL   Near or Above                    Optimal  130-159  mg/dL   Borderline  160-189  mg/dL   High  >190     mg/dL   Very High   LDLCALC 71 07/12/2009   Lab Results  Component Value Date   TSH 7.105 (H) 02/18/2020   TSH 0.52 02/11/2018    Therapeutic Level Labs: No results found for: "LITHIUM" Lab Results  Component Value Date   VALPROATE 70.7 09/24/2018   VALPROATE 71.5 01/30/2017   No results found for: "CBMZ"  Current Medications: Current Outpatient Medications  Medication Sig Dispense Refill   ALPRAZolam (XANAX) 0.5 MG tablet Take 1 tablet (0.5 mg total) by mouth 3 (three) times daily. 90 tablet 2   atorvastatin (LIPITOR) 20 MG tablet Take 20 mg by mouth daily.     cyclobenzaprine (FLEXERIL) 10 MG tablet TAKE ONE TABLET BY MOUTH THREE TIMES DAILY AS NEEDED FOR MUSCLE SPASMS 90 tablet 5   divalproex (DEPAKOTE) 125 MG DR tablet TAKE THREE (3) TABLETS BY MOUTH AT BEDTIME 90 tablet 2    DULoxetine (CYMBALTA) 60 MG capsule TAKE ONE CAPSULE BY MOUTH ONCE DAILY 30 capsule 2   ENULOSE 10 GM/15ML SOLN Take 20 g by mouth 2 (two) times daily.     ferrous sulfate 325 (65 FE) MG tablet Take 325 mg by mouth daily with breakfast.     FLUoxetine (PROZAC) 20 MG capsule Take 2 capsules (40 mg total) by mouth every morning. 30 capsule 2   hydrocortisone (ANUSOL-HC) 2.5 % rectal cream Place 1 application rectally 2 (two) times daily. (Patient taking differently: Place 1 application. rectally as needed.) 30 g 1   ibuprofen (ADVIL) 800 MG tablet Take 1 tablet (800 mg total) by mouth every  8 (eight) hours as needed. 90 tablet 1   lactulose (CHRONULAC) 10 GM/15ML solution Take 30 mLs (20 g total) by mouth 2 (two) times daily. 1800 mL 5   levothyroxine (SYNTHROID) 100 MCG tablet Take 100 mcg by mouth daily.     lisinopril (PRINIVIL,ZESTRIL) 10 MG tablet Take 10 mg by mouth at bedtime.      lubiprostone (AMITIZA) 24 MCG capsule TAKE ONE CAPSULE BY MOUTH TWICE DAILY WITH A MEAL 60 capsule 11   meloxicam (MOBIC) 7.5 MG tablet Take 1 tablet (7.5 mg total) by mouth daily. 30 tablet 5   Naldemedine Tosylate (SYMPROIC) 0.2 MG TABS Take by mouth.     omeprazole (PRILOSEC) 20 MG capsule Take 1 capsule (20 mg total) by mouth 2 (two) times daily before a meal. 180 capsule 1   oxybutynin (DITROPAN-XL) 10 MG 24 hr tablet Take 10 mg by mouth at bedtime.     oxyCODONE-acetaminophen (PERCOCET) 7.5-325 MG tablet TAKE ONE TABLET BY MOUTH EVERY 4 HOURS AS NEEDED FOR SEVERE PAIN 30 tablet 0   [START ON 03/02/2022] oxyCODONE-acetaminophen (PERCOCET) 7.5-325 MG tablet Take 1 tablet by mouth every 4 (four) hours as needed for up to 5 days for severe pain. 30 tablet 0   traZODone (DESYREL) 50 MG tablet Take 1 tablet (50 mg total) by mouth at bedtime. 30 tablet 2   No current facility-administered medications for this visit.     Musculoskeletal: Strength & Muscle Tone: na Gait & Station: na Patient leans:  N/A  Psychiatric Specialty Exam: Review of Systems  Musculoskeletal:  Positive for arthralgias, back pain and gait problem.  All other systems reviewed and are negative.   There were no vitals taken for this visit.There is no height or weight on file to calculate BMI.  General Appearance: NA  Eye Contact:  NA  Speech:  Clear and Coherent  Volume:  Normal  Mood:  Euthymic  Affect:  NA  Thought Process:  Goal Directed  Orientation:  Full (Time, Place, and Person)  Thought Content: WDL   Suicidal Thoughts:  No  Homicidal Thoughts:  No  Memory:  Immediate;   Good Recent;   Good Remote;   Fair  Judgement:  Good  Insight:  Fair  Psychomotor Activity:  Decreased  Concentration:  Concentration: Good and Attention Span: Good  Recall:  Good  Fund of Knowledge: Good  Language: Good  Akathisia:  No  Handed:  Right  AIMS (if indicated): not done  Assets:  Communication Skills Desire for Improvement Resilience Social Support  ADL's:  Intact  Cognition: WNL  Sleep:  Good   Screenings: GAD-7    Flowsheet Row Office Visit from 01/24/2022 in [facility]  Total GAD-7 Score 16      PHQ2-9    Flowsheet Row Video [facility] Office Visit from 01/24/2022 in [facility] Video Visit from 11/28/2021 in [facility] Video Visit from 10/07/2021 in [facility]  PHQ-2 Total Score 1 0 2 4  PHQ-9 Total Score -- 8 7 13       Flowsheet Row Video Visit from 02/28/2022 in [facility] Video Visit from 11/28/2021 [facility] from 09/06/2021 in [facility]  C-SSRS RISK CATEGORY No Risk No Risk No Risk        Assessment and Plan: This patient is a 70 year old female with a history depression  anxiety chronic pain in a  remote history of alcohol and drug abuse.  She is stable on her current regimen.  She will continue Cymbalta 60 mg daily for depression, Depakote 125 mg 3 times daily for mood stabilization, Xanax 0.5 mg up to 3 times daily for anxiety and trazodone 50 mg at bedtime for sleep.  She will return to see me in 3 months  Collaboration of Care: Collaboration of Care: Primary Care Provider AEB notes will be shared with PCP at patient's request  Patient/Guardian was advised Release of Information must be obtained prior to any record release in order to collaborate their care with an outside provider. Patient/Guardian was advised if they have not already done so to contact the registration department to sign all necessary forms in order for [place] to release information regarding their care.   Consent: Patient/Guardian gives verbal consent for treatment and assignment of benefits for services provided during this visit. Patient/Guardian expressed understanding and agreed to proceed.    Levonne Spiller, MD 02/28/2022, 2:15 PM

## 2022-03-02 ENCOUNTER — Ambulatory Visit (INDEPENDENT_AMBULATORY_CARE_PROVIDER_SITE_OTHER): Payer: Medicare Other | Admitting: Orthopedic Surgery

## 2022-03-02 DIAGNOSIS — S93621S Sprain of tarsometatarsal ligament of right foot, sequela: Secondary | ICD-10-CM | POA: Diagnosis not present

## 2022-03-02 DIAGNOSIS — M12571 Traumatic arthropathy, right ankle and foot: Secondary | ICD-10-CM | POA: Diagnosis not present

## 2022-03-03 ENCOUNTER — Encounter: Payer: Self-pay | Admitting: Orthopedic Surgery

## 2022-03-03 NOTE — Progress Notes (Addendum)
Office Visit Note   Patient: Diane [name]           Date of Birth: 01/08/1951           MRN: [id number] Visit Date: 03/02/2022              Requested by: Vickki Hearing, MD [address] [place],  [place] [address] PCP: Elfredia Nevins, MD  Chief Complaint  Patient presents with   Right Foot - Pain      HPI: Patient is a 71 year old woman who is seen for initial evaluation referral from Dr. Romeo Apple.  Patient complains of chronic midfoot pain with displacement across the Lisfranc complex.  Patient has had an MRI scan on March 9.  Past medical history negative for diabetes.  Assessment & Plan: Visit Diagnoses:  1. Traumatic arthritis of right foot   2. Lisfranc's sprain, right, sequela     Plan: Discussed conservative treatment with a stiff soled shoe versus surgical fusion across the Lisfranc complex.  Risks and benefits were discussed patient states she understands wished to proceed with surgery at this time.  May also require a gastrocnemius recession.  Follow-Up Instructions: No follow-ups on file.   Ortho Exam  Patient is alert, oriented, no adenopathy, well-dressed, normal affect, normal respiratory effort. Examination patient has a palpable pulse she does have Achilles contracture with dorsiflexion only to neutral.  She is tender to palpation across the Lisfranc complex.  Distraction across the midfoot is painful.  Imaging: No results found. No images are attached to the encounter.  Labs: Lab Results  Component Value Date   HGBA1C 5.5 02/19/2020   HGBA1C 5.8 (H) 01/25/2012   HGBA1C 5.7 07/13/2009   ESRSEDRATE 2 03/26/2018   ESRSEDRATE 4 03/16/2014   ESRSEDRATE 40 (H) 12/30/2012   CRP 0.8 03/26/2018   CRP <0.5 03/16/2014   CRP 1.6 (H) 12/30/2012   REPTSTATUS 10/31/2018 FINAL 10/28/2018   GRAMSTAIN  01/03/2013    RARE WBC PRESENT, PREDOMINANTLY MONONUCLEAR NO SQUAMOUS EPITHELIAL CELLS SEEN NO ORGANISMS SEEN   GRAMSTAIN  01/03/2013    RARE WBC  PRESENT, PREDOMINANTLY PMN NO SQUAMOUS EPITHELIAL CELLS SEEN NO ORGANISMS SEEN   GRAMSTAIN  01/03/2013    RARE WBC PRESENT,BOTH PMN AND MONONUCLEAR NO SQUAMOUS EPITHELIAL CELLS SEEN NO ORGANISMS SEEN   GRAMSTAIN  01/03/2013    RARE WBC PRESENT, PREDOMINANTLY PMN NO SQUAMOUS EPITHELIAL CELLS SEEN NO ORGANISMS SEEN   GRAMSTAIN  01/03/2013    RARE WBC PRESENT,BOTH PMN AND MONONUCLEAR NO SQUAMOUS EPITHELIAL CELLS SEEN NO ORGANISMS SEEN   GRAMSTAIN  01/03/2013    RARE WBC PRESENT, PREDOMINANTLY MONONUCLEAR NO SQUAMOUS EPITHELIAL CELLS SEEN NO ORGANISMS SEEN   CULT >=100,000 COLONIES/mL ESCHERICHIA COLI (A) 10/28/2018   LABORGA ESCHERICHIA COLI (A) 10/28/2018     Lab Results  Component Value Date   ALBUMIN 3.5 02/18/2020   ALBUMIN 3.4 (L) 11/10/2019   ALBUMIN 4.1 10/28/2018    Lab Results  Component Value Date   MG 1.7 02/18/2020   MG 2.2 10/28/2018   No results found for: "VD25OH"  No results found for: "PREALBUMIN"    Latest Ref Rng & Units 09/06/2021    3:19 PM 06/30/2020    2:32 PM 05/20/2020    2:02 PM  CBC EXTENDED  WBC 4.0 - 10.5 K/uL 6.0  8.6  5.6   RBC 3.87 - 5.11 MIL/uL 4.21  4.12  4.01   Hemoglobin 12.0 - 15.0 g/dL 62.3  76.2  83.1   HCT  36.0 - 46.0 % 37.6  36.2  36.4   Platelets 150 - 400 K/uL 182  235  220   NEUT# 1,500 - 7,800 cells/uL  4,180  3.3   Lymph# 850 - 3,900 cells/uL  3,861  1.7      There is no height or weight on file to calculate BMI.  Orders:  No orders of the defined types were placed in this encounter.  No orders of the defined types were placed in this encounter.    Procedures: No procedures performed  Clinical Data: No additional findings.  ROS:  All other systems negative, except as noted in the HPI. Review of Systems  Objective: Vital Signs: There were no vitals taken for this visit.  Specialty Comments:  No specialty comments available.  PMFS History: Patient Active Problem List   Diagnosis Date Noted    Syncope 02/18/2020   Prolonged QT interval 02/18/2020   Glucose intolerance 02/18/2020   Normocytic anemia 12/10/2019   Poor appetite 09/24/2019   Abnormal weight loss 09/24/2019   Upper abdominal mass 09/24/2019   Dysphagia 01/30/2019   Simple adnexal cyst greater than 1 cm in diameter in postmenopausal patient 11/08/2018   Abdominal pain 08/09/2015   Dysphagia, pharyngoesophageal phase    H/O adenomatous polyp of colon 05/05/2015   Constipation 02/02/2015   Knee contusion 12/09/2013   Esophageal dysphagia 07/02/2013   Abdominal pain, epigastric 07/02/2013   Chronic pain syndrome 11/28/2012   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   Cellulitis 11/04/2012   Postoperative wound breakdown 11/04/2012   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   Knee pain 08/13/2012   Hypokalemia 08/01/2012   Cellulitis, wound, post-operative 07/30/2012   Degenerative tear of medial meniscus of right knee 01/12/2012   OA (osteoarthritis) of knee 01/12/2012   Right knee sprain 11/02/2011   Instability of knee joint 03/29/2011   URTICARIA 12/21/2009   OTHER URINARY INCONTINENCE 07/18/2009   FOOT PAIN, LEFT 05/26/2009   DERMATITIS, CHRONIC 10/21/2008   IMPAIRED GLUCOSE TOLERANCE 09/09/2008   Chronic back pain 09/09/2008   Hypothyroidism 02/27/2008   Hyperlipidemia 02/27/2008   Anxiety with depression 02/27/2008   Bipolar I disorder, most recent episode (or current) depressed, unspecified 02/27/2008   HYPERTENSION 02/27/2008   ALLERGIC RHINITIS 02/27/2008   GERD 02/27/2008   HEADACHE 02/27/2008   Past Medical History:  Diagnosis Date   Allergic rhinitis    Anxiety    Arthritis    Cataracts, bilateral    Chronic back pain    Sees pain mamagement clinic   Chronic pain syndrome    Constipation    Depression    Essential hypertension    GERD (gastroesophageal reflux disease)    Headaches, cluster    History of seizures    Hypothyroid    Mania (HCC)    Mixed hyperlipidemia     PTSD (post-traumatic stress disorder)    Right lumbar radiculopathy    Sleep apnea    STOP BANG score=4   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(HCC)     Family History  Problem Relation Age of Onset   Alcohol abuse Father    Anxiety disorder Father    Alcohol abuse Mother    Anxiety disorder Mother    Alcohol abuse Brother    Anxiety disorder Brother    Drug abuse Brother    Drug abuse Brother    Alcohol abuse Brother    Anxiety disorder Brother    Seizures Brother    Alcohol abuse Brother  Anxiety disorder Brother    Seizures Brother    Alcohol abuse Brother    Anxiety disorder Brother    Alcohol abuse Brother    Anxiety disorder Brother    Breast cancer Sister    Bipolar disorder Sister    Breast cancer Sister    Dementia Paternal Aunt    ADD / ADHD Grandchild    ADD / ADHD Grandchild    Heart disease Other    Diabetes Other    Alcohol abuse Other    Thyroid disease Son    Thyroid disease Daughter    Depression Neg Hx    OCD Neg Hx    Paranoid behavior Neg Hx    Schizophrenia Neg Hx    Sexual abuse Neg Hx    Physical abuse Neg Hx    Colon cancer Neg Hx     Past Surgical History:  Procedure Laterality Date   BACK SURGERY     BILATERAL OOPHORECTOMY     CATARACT EXTRACTION W/PHACO Left 01/04/2016   Procedure: CATARACT EXTRACTION PHACO AND INTRAOCULAR LENS PLACEMENT (IOC);  Surgeon: Jethro Bolus, MD;  Location: [facility];  Service: Ophthalmology;  Laterality: Left;  CDE:4.70   CATARACT EXTRACTION W/PHACO Right 01/18/2016   Procedure: CATARACT EXTRACTION PHACO AND INTRAOCULAR LENS PLACEMENT RIGHT EYE; CDE:  4.66;  Surgeon: Jethro Bolus, MD;  Location: [facility];  Service: Ophthalmology;  Laterality: Right;   CHOLECYSTECTOMY     COLONOSCOPY  10/16/2008   WGN:FAOZHYQMVHQ, otherwise normal rectum; pancolonic diverticula the remainder of the colonic mucosa appeared normal. Next TCS due 10/2013.   COLONOSCOPY  06/28/2006   RMR: Internal hemorrhoids. Diminutive rectal polyps, cold  biopsied/ Pancolonic diverticula. Polyps in the right colon removed    COLONOSCOPY WITH PROPOFOL N/A 05/20/2015   RMR: Pancolonic diverticulosis. Redundant colon    COLONOSCOPY WITH PROPOFOL N/A 11/11/2020   Procedure: COLONOSCOPY WITH PROPOFOL;  Surgeon: Corbin Ade, MD;  [facility];  Service: Endoscopy;  Laterality: N/A;  pm ASA 2   ESOPHAGOGASTRODUODENOSCOPY  10/16/2008   ION:GEXBM hiatal hernia otherwise normal esophagus, stomach  D1, D2, status post passage of a 56-French Maloney dilator   ESOPHAGOGASTRODUODENOSCOPY  06/28/2006   WUX:LKGMWN esophagus. Small hiatal hernia. Otherwise normal stomach, D1 and D2, status post passage of a 56 French Maloney dilator   ESOPHAGOGASTRODUODENOSCOPY (EGD) WITH ESOPHAGEAL DILATION N/A 07/14/2013   RMR: Abnormal esophagus of uncertain significance-status post esophageal biospy. small hiatal hernia.    ESOPHAGOGASTRODUODENOSCOPY (EGD) WITH PROPOFOL N/A 05/20/2015   RMR: NORMAL EGD status post maloney dilation    ESOPHAGOGASTRODUODENOSCOPY (EGD) WITH PROPOFOL N/A 12/23/2018   Dr. Jena Gauss: normal esophagus, s/p esophageal dilation.    FOOT SURGERY     left foot-   INCISION AND DRAINAGE Right 01/03/2013   Procedure: INCISION AND DRAINAGE;  Surgeon: Vickki Hearing, MD;  Location: [facility];  Service: Orthopedics;  Laterality: Right;   KNEE ARTHROSCOPY  01/2012   right   KNEE ARTHROSCOPY WITH LATERAL RELEASE Right 01/03/2013   Procedure: Lateral Release Patella Right Knee;  Surgeon: Vickki Hearing, MD;  Location: [facility];  Service: Orthopedics;  Laterality: Right;   LAPAROSCOPIC LYSIS OF ADHESIONS N/A 11/12/2019   Procedure: LAPAROSCOPIC LYSIS OF ADHESIONS, extensive; drainage of peritoneal cyst;  Surgeon: Lazaro Arms, MD;  Location: [facility];  Service: Gynecology;  Laterality: N/A;   LUMBAR DISC SURGERY  L4-5   MALONEY DILATION N/A 05/20/2015   Procedure: Elease Hashimoto DILATION;  Surgeon: Corbin Ade, MD;  Location: [facility];  Service: Endoscopy;   Laterality: N/AElease Hashimoto dilator # 54   MALONEY DILATION N/A 12/23/2018   Procedure: Elease Hashimoto DILATION;  Surgeon: Corbin Ade, MD;  [facility];  Service: Endoscopy;  Laterality: N/A;   PARTIAL HYSTERECTOMY     TOTAL KNEE ARTHROPLASTY  07/01/2012   Procedure: TOTAL KNEE ARTHROPLASTY;  Surgeon: Vickki Hearing, MD;  [facility]: [facility];  Service: Orthopedics;  Laterality: Right;   TOTAL KNEE REVISION Right 01/03/2013   Procedure: Patellaplasty Right Knee;  Surgeon: Vickki Hearing, MD;  Location: [facility];  Service: Orthopedics;  Laterality: Right;   Social History   Occupational History   Occupation: disabled    Employer: UNEMPLOYED  Tobacco Use   Smoking status: Former    Packs/day: 0.25    Years: 5.00    Total pack years: 1.25    Types: Cigarettes    Quit date: 06/26/2003    Years since quitting: 18.6   Smokeless tobacco: Never  Vaping Use   Vaping Use: Never used  Substance and Sexual Activity   Alcohol use: Yes    Alcohol/week: 0.0 standard drinks of alcohol    Comment: occasionally; once a month.   Drug use: Not Currently    Comment: has a past history of street drug use   Sexual activity: Yes    Birth control/protection: Surgical    Comment: hyst

## 2022-03-08 ENCOUNTER — Other Ambulatory Visit: Payer: Self-pay | Admitting: Obstetrics & Gynecology

## 2022-03-08 DIAGNOSIS — L720 Epidermal cyst: Secondary | ICD-10-CM

## 2022-03-09 ENCOUNTER — Encounter: Payer: Self-pay | Admitting: Obstetrics & Gynecology

## 2022-03-09 ENCOUNTER — Ambulatory Visit (INDEPENDENT_AMBULATORY_CARE_PROVIDER_SITE_OTHER): Payer: Medicare Other | Admitting: Obstetrics & Gynecology

## 2022-03-09 ENCOUNTER — Telehealth: Payer: Self-pay | Admitting: Radiology

## 2022-03-09 ENCOUNTER — Ambulatory Visit (INDEPENDENT_AMBULATORY_CARE_PROVIDER_SITE_OTHER): Payer: Medicare Other

## 2022-03-09 ENCOUNTER — Other Ambulatory Visit: Payer: Self-pay | Admitting: Orthopedic Surgery

## 2022-03-09 VITALS — BP 131/77 | HR 71 | Ht 63.0 in

## 2022-03-09 DIAGNOSIS — K668 Other specified disorders of peritoneum: Secondary | ICD-10-CM

## 2022-03-09 DIAGNOSIS — L720 Epidermal cyst: Secondary | ICD-10-CM

## 2022-03-09 MED ORDER — OXYCODONE-ACETAMINOPHEN 7.5-325 MG PO TABS: 1.0000 | ORAL_TABLET | Freq: Three times a day (TID) | ORAL | 0 refills | Status: DC | PRN

## 2022-03-09 NOTE — Progress Notes (Signed)
PELVIC US TA/TV: normal vaginal cuff,right adnexa WNL,simple left unilocular cyst, posterior calcified wall,no color flow visualized 6 x 3.9 x 3.7 cm,no free fluid,left adnexal discomfort during ultrasound  Chaperone:Whitney

## 2022-03-09 NOTE — Progress Notes (Signed)
Follow up appointment for results: LLQ pain recurrent  Chief Complaint  Patient presents with   Follow-up    [place] today    Blood pressure 131/77, pulse 71, height 5\' 3"  (1.6 m).  US PELVIC COMPLETE WITH TRANSVAGINAL  Result Date: 03/09/2022 Images from the original result were not included.  ..an Sales executive of Ultrasound Medicine Diplomatic Services operational officer) accredited practice Center for [facility] @ [place] [place], [address] Ordering Provider: Janyth Pupa, DO                                                                                                                                   GYNECOLOGIC SONOGRAM Diane [name] is a 71 y.o. [id number] No LMP recorded. Patient has had a hysterectomy. She is here for a pelvic sonogram for follow up left peritoneal inclusion cyst. Uterus                   Surgically removed,normal vaginal cuff Endometrium        N/A Right ovary             Right oophorectomy,right adnexa WNL Left ovary                Left oophorectomy,simple left unilocular cyst, posterior calcified wall,no color flow visualized 6 x 3.9 x 3.7 cm No free fluid Technician Comments: PELVIC US TA/TV: normal vaginal cuff,right adnexa WNL,simple left unilocular cyst, posterior calcified wall,no color flow visualized 6 x 3.9 x 3.7 cm,no free fluid,left adnexal discomfort during ultrasound Chaperone:[address] Heide Guile 03/09/2022 3:44 PM Clinical Impression and recommendations: I have reviewed the sonogram results above, combined with the patient's current clinical course, below are my impressions and any appropriate recommendations for management based on the sonographic findings. Uterus absent Endometrium absent Ovaries: both absent Recurrent left peritoneal inclusion cyst Diane [name] 03/09/2022 4:11 PM   See My op note from 3/21-->history of peritonel inclusion cyst, extensive LOA done which resulted in drainage of peritoneal inclusion cyst Her constipation I'm[proved for 2  months or so and then continued  I feel strongly no further intraperitoneal surgery would be beneficial for this patient and potentially harmful  Will discuss with IR folks but again will not do anything for her functional bowel issues, unlikelyy the 6 cm perioneal inclusion cyst is primarily repsponsible just secondarily the result of her adhesions  MEDS ordered this encounter: No orders of the defined types were placed in this encounter.   Orders for this encounter: No orders of the defined types were placed in this encounter.   Impression + Management Plan   ICD-10-CM   1. Peritoneal inclusion cyst, recurrent  K66.8    do not recommend surgery or other interventions, doubt IR drainage would be helpful      Follow Up: prn     All questions were answered.  Past Medical History:  Diagnosis Date   Allergic  rhinitis    Anxiety    Arthritis    Cataracts, bilateral    Chronic back pain    Sees pain mamagement clinic   Chronic pain syndrome    Constipation    Depression    Essential hypertension    GERD (gastroesophageal reflux disease)    Headaches, cluster    History of seizures    Hypothyroid    Mania (HCC)    Mixed hyperlipidemia    PTSD (post-traumatic stress disorder)    Right lumbar radiculopathy    Sleep apnea    STOP BANG score=4   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[facility])     Past Surgical History:  Procedure Laterality Date   BACK SURGERY     BILATERAL OOPHORECTOMY     CATARACT EXTRACTION W/PHACO Left 01/04/2016   Procedure: CATARACT EXTRACTION PHACO AND INTRAOCULAR LENS PLACEMENT (IOC);  Surgeon: Jethro Bolus, MD;  [facility];  Service: Ophthalmology;  Laterality: Left;  CDE:4.70   CATARACT EXTRACTION W/PHACO Right 01/18/2016   Procedure: CATARACT EXTRACTION PHACO AND INTRAOCULAR LENS PLACEMENT RIGHT EYE; CDE:  4.66;  Surgeon: Jethro Bolus, MD;  [facility];  Service: Ophthalmology;  Laterality: Right;   CHOLECYSTECTOMY     COLONOSCOPY  10/16/2008    NWG:NFAOZHYQMVH, otherwise normal rectum; pancolonic diverticula the remainder of the colonic mucosa appeared normal. Next TCS due 10/2013.   COLONOSCOPY  06/28/2006   RMR: Internal hemorrhoids. Diminutive rectal polyps, cold biopsied/ Pancolonic diverticula. Polyps in the right colon removed    COLONOSCOPY WITH PROPOFOL N/A 05/20/2015   RMR: Pancolonic diverticulosis. Redundant colon    COLONOSCOPY WITH PROPOFOL N/A 11/11/2020   Procedure: COLONOSCOPY WITH PROPOFOL;  Surgeon: Corbin Ade, MD;  [facility];  Service: Endoscopy;  Laterality: N/A;  pm ASA 2   ESOPHAGOGASTRODUODENOSCOPY  10/16/2008   QIO:NGEXB hiatal hernia otherwise normal esophagus, stomach  D1, D2, status post passage of a 56-French Maloney dilator   ESOPHAGOGASTRODUODENOSCOPY  06/28/2006   MWU:XLKGMW esophagus. Small hiatal hernia. Otherwise normal stomach, D1 and D2, status post passage of a 56 French Maloney dilator   ESOPHAGOGASTRODUODENOSCOPY (EGD) WITH ESOPHAGEAL DILATION N/A 07/14/2013   RMR: Abnormal esophagus of uncertain significance-status post esophageal biospy. small hiatal hernia.    ESOPHAGOGASTRODUODENOSCOPY (EGD) WITH PROPOFOL N/A 05/20/2015   RMR: NORMAL EGD status post maloney dilation    ESOPHAGOGASTRODUODENOSCOPY (EGD) WITH PROPOFOL N/A 12/23/2018   Dr. Jena Gauss: normal esophagus, s/p esophageal dilation.    FOOT SURGERY     left foot-   INCISION AND DRAINAGE Right 01/03/2013   Procedure: INCISION AND DRAINAGE;  Surgeon: Vickki Hearing, MD;  [facility];  Service: Orthopedics;  Laterality: Right;   KNEE ARTHROSCOPY  01/2012   right   KNEE ARTHROSCOPY WITH LATERAL RELEASE Right 01/03/2013   Procedure: Lateral Release Patella Right Knee;  Surgeon: Vickki Hearing, MD;  Location: [facility];  Service: Orthopedics;  Laterality: Right;   LAPAROSCOPIC LYSIS OF ADHESIONS N/A 11/12/2019   Procedure: LAPAROSCOPIC LYSIS OF ADHESIONS, extensive; drainage of peritoneal cyst;  Surgeon: Lazaro Arms, MD;   Location: [facility];  Service: Gynecology;  Laterality: N/A;   LUMBAR DISC SURGERY  L4-5   MALONEY DILATION N/A 05/20/2015   Procedure: Elease Hashimoto DILATION;  Surgeon: Corbin Ade, MD;  [facility]: [facility];  Service: Endoscopy;  Laterality: N/AElease Hashimoto dilator # 54   MALONEY DILATION N/A 12/23/2018   Procedure: Elease Hashimoto DILATION;  Surgeon: Corbin Ade, MD;  [facility];  Service:  Endoscopy;  Laterality: N/A;   PARTIAL HYSTERECTOMY     TOTAL KNEE ARTHROPLASTY  07/01/2012   Procedure: TOTAL KNEE ARTHROPLASTY;  Surgeon: Carole Civil, MD;  Location: [facility];  Service: Orthopedics;  Laterality: Right;   TOTAL KNEE REVISION Right 01/03/2013   Procedure: Patellaplasty Right Knee;  Surgeon: Carole Civil, MD;  Location: [facility];  Service: Orthopedics;  Laterality: Right;    OB History     Gravida  2   Para  2   Term  2   Preterm      AB      Living  2      SAB      IAB      Ectopic      Multiple      Live Births              Allergies  Allergen Reactions   Neomycin-Bacitracin Zn-Polymyx Other (See Comments)    Reaction: skin starts to become raw and peels off   Penicillins Hives   Sulfonamide Derivatives Hives and Swelling    Social History   Socioeconomic History   Marital status: Legally Separated    Spouse name: Not on file   Number of children: Not on file   Years of education: 12th grade   Highest education level: Not on file  Occupational History   Occupation: disabled    Employer: UNEMPLOYED  Tobacco Use   Smoking status: Former    Packs/day: 0.25    Years: 5.00    Total pack years: 1.25    Types: Cigarettes    Quit date: 06/26/2003    Years since quitting: 18.7   Smokeless tobacco: Never  Vaping Use   Vaping Use: Never used  Substance and Sexual Activity   Alcohol use: Yes    Alcohol/week: 0.0 standard drinks of alcohol    Comment: occasionally; once a month.   Drug use: Not Currently    Comment: has a past history of  street drug use   Sexual activity: Yes    Birth control/protection: Surgical    Comment: hyst  Other Topics Concern   Not on file  Social History Narrative   Not on file   Social Determinants of Health   Financial Resource Strain: Low Risk  (01/24/2022)   Overall Financial Resource Strain (CARDIA)    Difficulty of Paying Living Expenses: Not hard at all  Food Insecurity: No Food Insecurity (01/24/2022)   Hunger Vital Sign    Worried About Running Out of Food in the Last Year: Never true    [place] in the Last Year: Never true  Transportation Needs: No Transportation Needs (01/24/2022)   PRAPARE - Hydrologist (Medical): No    Lack of Transportation (Non-Medical): No  Physical Activity: Inactive (01/24/2022)   Exercise Vital Sign    Days of Exercise per Week: 0 days    Minutes of Exercise per Session: 0 min  Stress: No Stress Concern Present (01/24/2022)   [place]    Feeling of Stress : Only a little  Social Connections: Socially Isolated (01/24/2022)   Social Connection and Isolation Panel [NHANES]    Frequency of Communication with Friends and Family: Once a week    Frequency of Social Gatherings with Friends and Family: Once a week    Attends Religious Services: 1 to 4 times per year    Active Member of [employer]  or Organizations: No    Attends Banker Meetings: Never    Marital Status: Separated    Family History  Problem Relation Age of Onset   Alcohol abuse Father    Anxiety disorder Father    Alcohol abuse Mother    Anxiety disorder Mother    Alcohol abuse Brother    Anxiety disorder Brother    Drug abuse Brother    Drug abuse Brother    Alcohol abuse Brother    Anxiety disorder Brother    Seizures Brother    Alcohol abuse Brother    Anxiety disorder Brother    Seizures Brother    Alcohol abuse Brother    Anxiety disorder Brother    Alcohol abuse  Brother    Anxiety disorder Brother    Breast cancer Sister    Bipolar disorder Sister    Breast cancer Sister    Dementia Paternal Aunt    ADD / ADHD Grandchild    ADD / ADHD Grandchild    Heart disease Other    Diabetes Other    Alcohol abuse Other    Thyroid disease Son    Thyroid disease Daughter    Depression Neg Hx    OCD Neg Hx    Paranoid behavior Neg Hx    Schizophrenia Neg Hx    Sexual abuse Neg Hx    Physical abuse Neg Hx    Colon cancer Neg Hx

## 2022-03-09 NOTE — Telephone Encounter (Signed)
Patient is in a lot of pain with her foot, and would like to get her surgery date ASAP.  Can you please call her to discuss?  Thanks!

## 2022-03-09 NOTE — Telephone Encounter (Signed)
Patient called asking for refill pain meds.  Last given oxycodone 7.5/325 on 03/02/22 by Dr Romeo Apple.  Can you please advise on refill pain meds for her?  Thanks.

## 2022-03-13 NOTE — Telephone Encounter (Signed)
I called and there was no answer and no voicemail set up to leave a message.  Will try again at a later time.

## 2022-03-16 ENCOUNTER — Telehealth: Payer: Self-pay | Admitting: Orthopedic Surgery

## 2022-03-16 ENCOUNTER — Other Ambulatory Visit: Payer: Self-pay | Admitting: Orthopedic Surgery

## 2022-03-16 DIAGNOSIS — G894 Chronic pain syndrome: Secondary | ICD-10-CM

## 2022-03-16 MED ORDER — OXYCODONE-ACETAMINOPHEN 7.5-325 MG PO TABS: ORAL_TABLET | ORAL | 0 refills | Status: DC

## 2022-03-16 NOTE — Telephone Encounter (Signed)
YES GOOD ADVICE C/W CURRENT PROTOCOLS

## 2022-03-16 NOTE — Telephone Encounter (Signed)
Patient called with question about her medication and what she states she has seen in [place], per referral - has concerns. Please call patient.

## 2022-03-16 NOTE — Telephone Encounter (Signed)
  Can you please "refuse this request" it will not let me close it out. Thanks!!

## 2022-03-16 NOTE — Telephone Encounter (Signed)
Patient wants to ask about Dr Lajoyce Corners / he cut her medicine back (Percocet) and wants her to stop taking it before her foot surgery scheduled for July 29th. She wants to know if Dr Romeo Apple agrees with this plan, I have told her yes that is the recommendation before surgery is to be off the Percocet so it will help after surgery , recommendation is 30 days, if she does not stop it her pain may not be very well controlled after surgery  She voiced understanding states she will try   To you FYI

## 2022-03-16 NOTE — Telephone Encounter (Signed)
Pt informed

## 2022-03-30 ENCOUNTER — Encounter (HOSPITAL_COMMUNITY): Payer: Self-pay | Admitting: Orthopedic Surgery

## 2022-03-30 NOTE — Progress Notes (Signed)
PCP - Elfredia Nevins, MD Cardiologist - Jonelle Sidle, MD EKG - DOS Chest x-ray -  ECHO -  Cardiac Cath -  CPAP -  ERAS clears 0845 COVID TEST- n/a  Anesthesia review: n/a  -------------  SDW INSTRUCTIONS:  Your procedure is scheduled on 7/21. Please report to [facility] Main Entrance "A" at 0845 A.M., and check in at the Admitting office. Call this number if you have problems the morning of surgery: [phone]   Remember: Do not eat after midnight the night before your surgery  You may drink clear liquids until 0815AM the morning of your surgery.   Clear liquids allowed are: Water, Non-Citrus Juices (without pulp), Carbonated Beverages, Clear Tea, Black Coffee Only, and Gatorade   Medications to take morning of surgery with a sip of water include: ALPRAZolam Prudy Feeler) if needed atorvastatin (LIPITOR) DULoxetine (CYMBALTA) FLUoxetine (PROZAC) levothyroxine (SYNTHROID) 100  omeprazole (PRILOSEC)  cyclobenzaprine (FLEXERIL)  if needed  As of today, STOP taking any Aspirin (unless otherwise instructed by your surgeon), Aleve, Naproxen, Ibuprofen, Motrin, Advil, Goody's, BC's, all herbal medications, fish oil, and all vitamins.    The Morning of Surgery Do not wear jewelry, make-up or nail polish. Do not wear lotions, powders, or perfumes/colognes, or deodorant Do not bring valuables to the hospital. [facility] is not responsible for any belongings or valuables.  If you are a smoker, DO NOT Smoke 24 hours prior to surgery  If you wear a CPAP at night please bring your mask the morning of surgery   Remember that you must have someone to transport you home after your surgery, and remain with you for 24 hours if you are discharged the same day.  Please bring cases for contacts, glasses, hearing aids, dentures or bridgework because it cannot be worn into surgery.   Patients discharged the day of surgery will not be allowed to drive home.   Please shower the NIGHT  BEFORE/MORNING OF SURGERY (use antibacterial soap like DIAL soap if possible). Wear comfortable clothes the morning of surgery. Oral Hygiene is also important to reduce your risk of infection.  Remember - BRUSH YOUR TEETH THE MORNING OF SURGERY WITH YOUR REGULAR TOOTHPASTE  Patient denies shortness of breath, fever, cough and chest pain.

## 2022-03-31 ENCOUNTER — Encounter (HOSPITAL_COMMUNITY): Payer: Self-pay | Admitting: Orthopedic Surgery

## 2022-03-31 ENCOUNTER — Ambulatory Visit (HOSPITAL_COMMUNITY): Payer: Medicare Other | Admitting: Anesthesiology

## 2022-03-31 ENCOUNTER — Encounter (HOSPITAL_COMMUNITY)
Admission: RE | Disposition: A | Discharge status: Home / Self Care | Payer: Self-pay | Source: Ambulatory Visit | Attending: Orthopedic Surgery

## 2022-03-31 ENCOUNTER — Ambulatory Visit (HOSPITAL_COMMUNITY)
Admission: RE | Admit: 2022-03-31 | Discharge: 2022-03-31 | Disposition: A | Discharge status: Home / Self Care | Payer: Medicare Other | Source: Ambulatory Visit | Attending: Orthopedic Surgery | Admitting: Orthopedic Surgery

## 2022-03-31 ENCOUNTER — Ambulatory Visit (HOSPITAL_BASED_OUTPATIENT_CLINIC_OR_DEPARTMENT_OTHER): Payer: Medicare Other | Admitting: Anesthesiology

## 2022-03-31 ENCOUNTER — Ambulatory Visit (HOSPITAL_COMMUNITY): Payer: Medicare Other

## 2022-03-31 ENCOUNTER — Other Ambulatory Visit: Payer: Self-pay

## 2022-03-31 DIAGNOSIS — I10 Essential (primary) hypertension: Secondary | ICD-10-CM | POA: Diagnosis not present

## 2022-03-31 DIAGNOSIS — Z87891 Personal history of nicotine dependence: Secondary | ICD-10-CM | POA: Diagnosis not present

## 2022-03-31 DIAGNOSIS — M19071 Primary osteoarthritis, right ankle and foot: Secondary | ICD-10-CM

## 2022-03-31 DIAGNOSIS — G4730 Sleep apnea, unspecified: Secondary | ICD-10-CM | POA: Diagnosis not present

## 2022-03-31 DIAGNOSIS — E039 Hypothyroidism, unspecified: Secondary | ICD-10-CM

## 2022-03-31 HISTORY — PX: FOOT ARTHRODESIS: SHX1655

## 2022-03-31 HISTORY — PX: GASTROCNEMIUS RECESSION: SHX863

## 2022-03-31 LAB — CBC
HCT: 39.7 % (ref 36.0–46.0)
Hemoglobin: 12.1 g/dL (ref 12.0–15.0)
MCH: 29 pg (ref 26.0–34.0)
MCHC: 30.5 g/dL (ref 30.0–36.0)
MCV: 95.2 fL (ref 80.0–100.0)
Platelets: 158 10*3/uL (ref 150–400)
RBC: 4.17 MIL/uL (ref 3.87–5.11)
RDW: 13 % (ref 11.5–15.5)
WBC: 5.2 10*3/uL (ref 4.0–10.5)
nRBC: 0 % (ref 0.0–0.2)

## 2022-03-31 LAB — BASIC METABOLIC PANEL
Anion gap: 8 (ref 5–15)
BUN: 11 mg/dL (ref 8–23)
CO2: 24 mmol/L (ref 22–32)
Calcium: 8.5 mg/dL — ABNORMAL LOW (ref 8.9–10.3)
Chloride: 107 mmol/L (ref 98–111)
Creatinine, Ser: 1.1 mg/dL — ABNORMAL HIGH (ref 0.44–1.00)
GFR, Estimated: 54 mL/min — ABNORMAL LOW (ref >60)
Glucose, Bld: 96 mg/dL (ref 70–99)
Potassium: 4.4 mmol/L (ref 3.5–5.1)
Sodium: 139 mmol/L (ref 135–145)

## 2022-03-31 SURGERY — FUSION, JOINT, FOOT
Anesthesia: Monitor Anesthesia Care | Site: Foot | Laterality: Right

## 2022-03-31 MED ORDER — FENTANYL CITRATE (PF) 100 MCG/2ML IJ SOLN: 25.0000 ug | INTRAMUSCULAR | Status: DC | PRN

## 2022-03-31 MED ORDER — ORAL CARE MOUTH RINSE: 15.0000 mL | Freq: Once | OROMUCOSAL | Status: AC

## 2022-03-31 MED ORDER — BUPIVACAINE HCL (PF) 0.25 % IJ SOLN: INTRAMUSCULAR | Status: DC | PRN

## 2022-03-31 MED ORDER — PHENYLEPHRINE 80 MCG/ML (10ML) SYRINGE FOR IV PUSH (FOR BLOOD PRESSURE SUPPORT)
PREFILLED_SYRINGE | INTRAVENOUS | Status: DC | PRN
  Administered 2022-03-31 (×3): 80 ug via INTRAVENOUS

## 2022-03-31 MED ORDER — FENTANYL CITRATE (PF) 100 MCG/2ML IJ SOLN: 50.0000 ug | Freq: Once | INTRAMUSCULAR | Status: AC

## 2022-03-31 MED ORDER — ROPIVACAINE HCL 5 MG/ML IJ SOLN
INTRAMUSCULAR | Status: DC | PRN
  Administered 2022-03-31: 20 mL via PERINEURAL
  Administered 2022-03-31: 30 mL via PERINEURAL

## 2022-03-31 MED ORDER — LACTATED RINGERS IV SOLN: INTRAVENOUS | Status: DC

## 2022-03-31 MED ORDER — AMISULPRIDE (ANTIEMETIC) 5 MG/2ML IV SOLN: 10.0000 mg | Freq: Once | INTRAVENOUS | Status: DC | PRN

## 2022-03-31 MED ORDER — ROCURONIUM BROMIDE 10 MG/ML (PF) SYRINGE
PREFILLED_SYRINGE | INTRAVENOUS | Status: AC
  Filled 2022-03-31: qty 10

## 2022-03-31 MED ORDER — FENTANYL CITRATE (PF) 250 MCG/5ML IJ SOLN
INTRAMUSCULAR | Status: AC
  Filled 2022-03-31: qty 5

## 2022-03-31 MED ORDER — VANCOMYCIN HCL IN DEXTROSE 1-5 GM/200ML-% IV SOLN
1000.0000 mg | INTRAVENOUS | Status: AC
  Administered 2022-03-31: 1000 mg via INTRAVENOUS
  Filled 2022-03-31: qty 200

## 2022-03-31 MED ORDER — OXYCODONE HCL 5 MG/5ML PO SOLN: 5.0000 mg | Freq: Once | ORAL | Status: DC | PRN

## 2022-03-31 MED ORDER — ONDANSETRON HCL 4 MG/2ML IJ SOLN: 4.0000 mg | Freq: Once | INTRAMUSCULAR | Status: DC | PRN

## 2022-03-31 MED ORDER — CHLORHEXIDINE GLUCONATE 0.12 % MT SOLN
15.0000 mL | Freq: Once | OROMUCOSAL | Status: AC
Start: 2022-03-31 — End: 2022-03-31
  Administered 2022-03-31: 15 mL via OROMUCOSAL
  Filled 2022-03-31: qty 15

## 2022-03-31 MED ORDER — MIDAZOLAM HCL 2 MG/2ML IJ SOLN: 1.0000 mg | Freq: Once | INTRAMUSCULAR | Status: AC

## 2022-03-31 MED ORDER — PROPOFOL 500 MG/50ML IV EMUL
INTRAVENOUS | Status: DC | PRN
  Administered 2022-03-31: 100 ug/kg/min via INTRAVENOUS

## 2022-03-31 MED ORDER — OXYCODONE-ACETAMINOPHEN 5-325 MG PO TABS: 1.0000 | ORAL_TABLET | ORAL | 0 refills | Status: DC | PRN

## 2022-03-31 MED ORDER — MIDAZOLAM HCL 2 MG/2ML IJ SOLN
INTRAMUSCULAR | Status: AC
  Administered 2022-03-31: 1 mg via INTRAVENOUS
  Filled 2022-03-31: qty 2

## 2022-03-31 MED ORDER — SODIUM CHLORIDE 0.9 % IR SOLN
Status: DC | PRN
  Administered 2022-03-31: 1000 mL

## 2022-03-31 MED ORDER — FENTANYL CITRATE (PF) 100 MCG/2ML IJ SOLN
INTRAMUSCULAR | Status: AC
  Administered 2022-03-31: 50 ug via INTRAVENOUS
  Filled 2022-03-31: qty 2

## 2022-03-31 MED ORDER — DEXAMETHASONE SODIUM PHOSPHATE 10 MG/ML IJ SOLN
INTRAMUSCULAR | Status: DC | PRN
  Administered 2022-03-31: 5 mg

## 2022-03-31 MED ORDER — PROPOFOL 10 MG/ML IV BOLUS
INTRAVENOUS | Status: DC | PRN
  Administered 2022-03-31: 20 mg via INTRAVENOUS

## 2022-03-31 MED ORDER — ACETAMINOPHEN 500 MG PO TABS
1000.0000 mg | ORAL_TABLET | Freq: Once | ORAL | Status: AC
  Administered 2022-03-31: 1000 mg via ORAL
  Filled 2022-03-31: qty 2

## 2022-03-31 MED ORDER — OXYCODONE HCL 5 MG PO TABS: 5.0000 mg | ORAL_TABLET | Freq: Once | ORAL | Status: DC | PRN

## 2022-03-31 MED ORDER — LIDOCAINE 2% (20 MG/ML) 5 ML SYRINGE
INTRAMUSCULAR | Status: AC
  Filled 2022-03-31: qty 5

## 2022-03-31 MED ORDER — PROPOFOL 10 MG/ML IV BOLUS
INTRAVENOUS | Status: AC
Start: 2022-03-31 — End: ?
  Filled 2022-03-31: qty 20

## 2022-03-31 SURGICAL SUPPLY — 51 items
BAG COUNTER SPONGE SURGICOUNT (BAG) ×2 IMPLANT
BIT DRILL 3.0 6IN (DRILL) IMPLANT
BLADE SAW SGTL HD 18.5X60.5X1. (BLADE) ×2 IMPLANT
BLADE SAW SGTL NAR THIN XSHT (BLADE) ×1 IMPLANT
BNDG COHESIVE 4X5 TAN STRL (GAUZE/BANDAGES/DRESSINGS) ×2 IMPLANT
BNDG COHESIVE 6X5 TAN ST LF (GAUZE/BANDAGES/DRESSINGS) ×1 IMPLANT
BNDG COHESIVE 6X5 TAN STRL LF (GAUZE/BANDAGES/DRESSINGS) ×2 IMPLANT
BNDG GAUZE ELAST 4 BULKY (GAUZE/BANDAGES/DRESSINGS) ×4 IMPLANT
COTTON STERILE ROLL (GAUZE/BANDAGES/DRESSINGS) ×2 IMPLANT
COVER MAYO STAND STRL (DRAPES) ×2 IMPLANT
COVER SURGICAL LIGHT HANDLE (MISCELLANEOUS) ×4 IMPLANT
DRAPE INCISE IOBAN 66X45 STRL (DRAPES) ×2 IMPLANT
DRAPE OEC MINIVIEW 54X84 (DRAPES) ×1 IMPLANT
DRAPE U-SHAPE 47X51 STRL (DRAPES) ×2 IMPLANT
DRILL 3.0 6IN (DRILL) ×2
DRSG ADAPTIC 3X8 NADH LF (GAUZE/BANDAGES/DRESSINGS) ×2 IMPLANT
DRSG EMULSION OIL 3X3 NADH (GAUZE/BANDAGES/DRESSINGS) ×2 IMPLANT
DURAPREP 26ML APPLICATOR (WOUND CARE) ×2 IMPLANT
ELECT REM PT RETURN 9FT ADLT (ELECTROSURGICAL) ×2
ELECTRODE REM PT RTRN 9FT ADLT (ELECTROSURGICAL) ×1 IMPLANT
GAUZE PACKING 2X5 YD STRL (GAUZE/BANDAGES/DRESSINGS) ×1 IMPLANT
GAUZE SPONGE 4X4 12PLY STRL (GAUZE/BANDAGES/DRESSINGS) ×2 IMPLANT
GLOVE BIOGEL PI IND STRL 9 (GLOVE) ×1 IMPLANT
GLOVE BIOGEL PI INDICATOR 9 (GLOVE) ×1
GLOVE SURG ORTHO 9.0 STRL STRW (GLOVE) ×2 IMPLANT
GOWN STRL REUS W/ TWL XL LVL3 (GOWN DISPOSABLE) ×3 IMPLANT
GOWN STRL REUS W/TWL XL LVL3 (GOWN DISPOSABLE) ×6
GUIDEWIRE 1.6 6IN (WIRE) ×2 IMPLANT
KIT BASIN OR (CUSTOM PROCEDURE TRAY) ×2 IMPLANT
KIT STAPLE ARCUS 16X13 STRL (Staple) ×1 IMPLANT
KIT TURNOVER KIT B (KITS) ×2 IMPLANT
MANIFOLD NEPTUNE II (INSTRUMENTS) ×2 IMPLANT
NS IRRIG 1000ML POUR BTL (IV SOLUTION) ×2 IMPLANT
PACK ORTHO EXTREMITY (CUSTOM PROCEDURE TRAY) ×2 IMPLANT
PAD ABD 8X10 STRL (GAUZE/BANDAGES/DRESSINGS) ×1 IMPLANT
PAD ARMBOARD 7.5X6 YLW CONV (MISCELLANEOUS) ×4 IMPLANT
PAD CAST 4YDX4 CTTN HI CHSV (CAST SUPPLIES) ×1 IMPLANT
PADDING CAST ABS 4INX4YD NS (CAST SUPPLIES) ×1
PADDING CAST ABS COTTON 4X4 ST (CAST SUPPLIES) ×1 IMPLANT
PADDING CAST COTTON 4X4 STRL (CAST SUPPLIES) ×2
SCREW CANN SHT HDLS 4X40 (Screw) ×1 IMPLANT
SCREW CANN SHT HDLS 4X42 (Screw) ×1 IMPLANT
SPONGE T-LAP 18X18 ~~LOC~~+RFID (SPONGE) ×2 IMPLANT
SUCTION FRAZIER HANDLE 10FR (MISCELLANEOUS) ×2
SUCTION TUBE FRAZIER 10FR DISP (MISCELLANEOUS) ×1 IMPLANT
SUT ETHILON 2 0 PSLX (SUTURE) ×6 IMPLANT
TOWEL GREEN STERILE (TOWEL DISPOSABLE) ×2 IMPLANT
TOWEL GREEN STERILE FF (TOWEL DISPOSABLE) ×2 IMPLANT
TUBE CONNECTING 12X1/4 (SUCTIONS) ×2 IMPLANT
UNDERPAD 30X36 HEAVY ABSORB (UNDERPADS AND DIAPERS) ×2 IMPLANT
WATER STERILE IRR 1000ML POUR (IV SOLUTION) ×2 IMPLANT

## 2022-03-31 NOTE — Transfer of Care (Signed)
Immediate Anesthesia Transfer of Care Note  Patient: Chantele Worthy Flank  Procedure(s) Performed: RIGHT FOOT TARSAL-METATARSAL FUSION (Right: Foot) RIGHT ACHILLES LENGTHENING (Right: Foot)  Patient Location: PACU  Anesthesia Type:MAC combined with regional for post-op pain  Level of Consciousness: drowsy and patient cooperative  Airway & Oxygen Therapy: Patient Spontanous Breathing and Patient connected to face mask oxygen  Post-op Assessment: Report given to RN, Post -op Vital signs reviewed and stable and Patient moving all extremities  Post vital signs: Reviewed and stable  Last Vitals:  Vitals Value Taken Time  BP 110/65 03/31/22 1315  Temp 36.1 C 03/31/22 1310  Pulse 63 03/31/22 1318  Resp 12 03/31/22 1318  SpO2 97 % 03/31/22 1318  Vitals shown include unvalidated device data.  Last Pain:  Vitals:   03/31/22 1310  PainSc: Asleep      Patients Stated Pain Goal: 3 (62/95/28 [id number])  Complications: No notable events documented.

## 2022-03-31 NOTE — Anesthesia Preprocedure Evaluation (Signed)
Anesthesia Evaluation  Patient identified by MRN, date of birth, ID band Patient awake    Reviewed: Allergy & Precautions, NPO status , Patient's Chart, lab work & pertinent test results  History of Anesthesia Complications Negative for: history of anesthetic complications  Airway Mallampati: III  TM Distance: >3 FB Neck ROM: Full    Dental   Pulmonary sleep apnea , former smoker,    Pulmonary exam normal        Cardiovascular hypertension, Normal cardiovascular exam     Neuro/Psych Anxiety Depression Bipolar Disorder negative neurological ROS     GI/Hepatic GERD  ,(+)     substance abuse  ,   Endo/Other  Hypothyroidism   Renal/GU negative Renal ROS  negative genitourinary   Musculoskeletal  (+) Arthritis ,   Abdominal   Peds  Hematology negative hematology ROS (+)   Anesthesia Other Findings   Reproductive/Obstetrics                             Anesthesia Physical Anesthesia Plan  ASA: 3  Anesthesia Plan: MAC   Post-op Pain Management: Regional block*, Tylenol PO (pre-op)* and Toradol IV (intra-op)*   Induction: Intravenous  PONV Risk Score and Plan: 2 and Propofol infusion, TIVA and Treatment may vary due to age or medical condition  Airway Management Planned: Natural Airway, Nasal Cannula and Simple Face Mask  Additional Equipment: None  Intra-op Plan:   Post-operative Plan:   Informed Consent: I have reviewed the patients History and Physical, chart, labs and discussed the procedure including the risks, benefits and alternatives for the proposed anesthesia with the patient or authorized representative who has indicated his/her understanding and acceptance.       Plan Discussed with:   Anesthesia Plan Comments:         Anesthesia Quick Evaluation

## 2022-03-31 NOTE — Anesthesia Procedure Notes (Signed)
Anesthesia Regional Block: Popliteal block   Pre-Anesthetic Checklist: , timeout performed,  Correct Patient, Correct Site, Correct Laterality,  Correct Procedure, Correct Position, site marked,  Risks and benefits discussed,  Surgical consent,  Pre-op evaluation,  At surgeon's request and post-op pain management  Laterality: Right  Prep: chloraprep       Needles:  Injection technique: Single-shot  Needle Type: Echogenic Stimulator Needle     Needle Length: 10cm  Needle Gauge: 20     Additional Needles:   Procedures:,,,, ultrasound used (permanent image in chart),,    Narrative:  Start time: 03/31/2022 10:54 AM End time: 03/31/2022 10:56 AM  Performed by: Personally  Anesthesiologist: Lucretia Kern, MD  Additional Notes: Standard monitors applied. Skin prepped. Good needle visualization with ultrasound. Injection made in 5cc increments with no resistance to injection. Patient tolerated the procedure well.

## 2022-03-31 NOTE — Anesthesia Postprocedure Evaluation (Signed)
Anesthesia Post Note  Patient: Diane [name]  Procedure(s) Performed: RIGHT FOOT TARSAL-METATARSAL FUSION (Right: Foot) RIGHT ACHILLES LENGTHENING (Right: Foot)     Patient location during evaluation: PACU Anesthesia Type: MAC Level of consciousness: awake and alert Pain management: pain level controlled Vital Signs Assessment: post-procedure vital signs reviewed and stable Respiratory status: spontaneous breathing, nonlabored ventilation and respiratory function stable Cardiovascular status: blood pressure returned to baseline and stable Postop Assessment: no apparent nausea or vomiting Anesthetic complications: no   No notable events documented.  Last Vitals:  Vitals:   03/31/22 1315 03/31/22 1330  BP: 110/65 125/75  Pulse: 64 63  Resp: 12 12  Temp:  (!) 36.1 C  SpO2: 97% 100%    Last Pain:  Vitals:   03/31/22 1330  PainSc: 0-No pain                 Lidia Collum

## 2022-03-31 NOTE — Op Note (Signed)
03/31/2022  1:19 PM  PATIENT:  Diane [name]    PRE-OPERATIVE DIAGNOSIS:  Arthritis Right Midfoot  POST-OPERATIVE DIAGNOSIS:  Same  PROCEDURE:  RIGHT FOOT TARSAL-METATARSAL FUSION,  SURGEON:  Nadara Mustard, MD  PHYSICIAN ASSISTANT:None ANESTHESIA:   General  PREOPERATIVE INDICATIONS:  Diane [name] is a  71 y.o. female with a diagnosis of Arthritis Right Midfoot who failed conservative measures and elected for surgical management.    The risks benefits and alternatives were discussed with the patient preoperatively including but not limited to the risks of infection, bleeding, nerve injury, cardiopulmonary complications, the need for revision surgery, among others, and the patient was willing to proceed.  OPERATIVE IMPLANTS: 2 headless 4.0 cannulated screws and one staple.  @ENCIMAGES @  OPERATIVE FINDINGS: C-arm fluoroscopy verified reduction base of the first metatarsal medial cuneiform and Lisfranc complex.  OPERATIVE PROCEDURE: Patient brought the operating room after undergoing a regional anesthetic.  After adequate levels anesthesia were obtained patient's right lower extremity was prepped using DuraPrep draped into a sterile field a timeout was called.  A dorsal incision was made on the right foot over the first webspace.  Blunt dissection was carried down to the EHL tendon which was retracted laterally to protect the neurovascular bundle.  Retractors were placed there was degenerative instability of the base of the first metatarsal medial cuneiform.  A oscillating saw was used to resect the articular cartilage from the joint this was debrided with a curette.  The wound was reduced stabilized with a K wire and a 4.0 headless cannulated screw was used to stabilize the joint.  This was then reinforced with a staple medially.  A guidewire was inserted from the medial cuneiform to the base of the second metatarsal C-arm possibly verified alignment and the Lisfranc complex was stabilized  with a 4.0 headless cannulated screw.  The wound was irrigated normal saline.  Incision closed using 2-0 nylon a sterile dressing was applied patient was taken the PACU in stable condition.   DISCHARGE PLANNING:  Antibiotic duration: Preoperative antibiotics  Weightbearing: Touchdown weightbearing on the right  Pain medication: Prescription for Percocet  Dressing care/ Wound VAC: Dry dressing  Ambulatory devices: Crutches  Discharge to: Home.  Follow-up: In the office 1 week post operative.

## 2022-03-31 NOTE — H&P (Signed)
Diane [name] is an 71 y.o. female.   Chief Complaint: Right midfoot pain. HPI: Patient is a 71 year old woman who complains of chronic midfoot pain across the Lisfranc complex.  MRI scan has been obtained in March.  Patient denies any specific trauma states this is progressive arthritic pain.  Past medical history negative for diabetes.  Patient complains of pain with activities of daily living despite conservative treatment.  Past Medical History:  Diagnosis Date   Allergic rhinitis    Anxiety    Arthritis    Cataracts, bilateral    Chronic back pain    Sees pain mamagement clinic   Chronic pain syndrome    Constipation    Depression    Essential hypertension    GERD (gastroesophageal reflux disease)    Headaches, cluster    History of seizures    Hypothyroid    Mania (HCC)    Mixed hyperlipidemia    PTSD (post-traumatic stress disorder)    Right lumbar radiculopathy    Sleep apnea    STOP BANG score=4   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[facility])     Past Surgical History:  Procedure Laterality Date   BACK SURGERY     BILATERAL OOPHORECTOMY     CATARACT EXTRACTION W/PHACO Left 01/04/2016   Procedure: CATARACT EXTRACTION PHACO AND INTRAOCULAR LENS PLACEMENT (IOC);  Surgeon: Jethro Bolus, MD;  Location: [facility];  Service: Ophthalmology;  Laterality: Left;  CDE:4.70   CATARACT EXTRACTION W/PHACO Right 01/18/2016   Procedure: CATARACT EXTRACTION PHACO AND INTRAOCULAR LENS PLACEMENT RIGHT EYE; CDE:  4.66;  Surgeon: Jethro Bolus, MD;  Location: [facility];  Service: Ophthalmology;  Laterality: Right;   CHOLECYSTECTOMY     COLONOSCOPY  10/16/2008   VPX:[id number], otherwise normal rectum; pancolonic diverticula the remainder of the colonic mucosa appeared normal. Next TCS due 10/2013.   COLONOSCOPY  06/28/2006   RMR: Internal hemorrhoids. Diminutive rectal polyps, cold biopsied/ Pancolonic diverticula. Polyps in the right colon removed    COLONOSCOPY WITH PROPOFOL N/A 05/20/2015   RMR: Pancolonic  diverticulosis. Redundant colon    COLONOSCOPY WITH PROPOFOL N/A 11/11/2020   Procedure: COLONOSCOPY WITH PROPOFOL;  Surgeon: Corbin Ade, MD;  Location: [facility];  Service: Endoscopy;  Laterality: N/A;  pm ASA 2   ESOPHAGOGASTRODUODENOSCOPY  10/16/2008   EVO:JJKKX hiatal hernia otherwise normal esophagus, stomach  D1, D2, status post passage of a 56-French Maloney dilator   ESOPHAGOGASTRODUODENOSCOPY  06/28/2006   FGH:WEXHBZ esophagus. Small hiatal hernia. Otherwise normal stomach, D1 and D2, status post passage of a 56 French Maloney dilator   ESOPHAGOGASTRODUODENOSCOPY (EGD) WITH ESOPHAGEAL DILATION N/A 07/14/2013   RMR: Abnormal esophagus of uncertain significance-status post esophageal biospy. small hiatal hernia.    ESOPHAGOGASTRODUODENOSCOPY (EGD) WITH PROPOFOL N/A 05/20/2015   RMR: NORMAL EGD status post maloney dilation    ESOPHAGOGASTRODUODENOSCOPY (EGD) WITH PROPOFOL N/A 12/23/2018   Dr. Jena Gauss: normal esophagus, s/p esophageal dilation.    FOOT SURGERY     left foot-   INCISION AND DRAINAGE Right 01/03/2013   Procedure: INCISION AND DRAINAGE;  Surgeon: Vickki Hearing, MD;  Location: [facility];  Service: Orthopedics;  Laterality: Right;   KNEE ARTHROSCOPY  01/2012   right   KNEE ARTHROSCOPY WITH LATERAL RELEASE Right 01/03/2013   Procedure: Lateral Release Patella Right Knee;  Surgeon: Vickki Hearing, MD;  Location: [facility];  Service: Orthopedics;  Laterality: Right;   LAPAROSCOPIC LYSIS OF ADHESIONS N/A 11/12/2019   Procedure: LAPAROSCOPIC LYSIS OF ADHESIONS, extensive; drainage of peritoneal  cyst;  Surgeon: Lazaro Arms, MD;  Location: [facility];  Service: Gynecology;  Laterality: N/A;   LUMBAR DISC SURGERY  L4-5   MALONEY DILATION N/A 05/20/2015   Procedure: Elease Hashimoto DILATION;  Surgeon: Corbin Ade, MD;  Location: [facility];  Service: Endoscopy;  Laterality: N/AElease Hashimoto dilator # 54   MALONEY DILATION N/A 12/23/2018   Procedure: Elease Hashimoto DILATION;  Surgeon: Corbin Ade, MD;   Location: [facility];  Service: Endoscopy;  Laterality: N/A;   PARTIAL HYSTERECTOMY     TOTAL KNEE ARTHROPLASTY  07/01/2012   Procedure: TOTAL KNEE ARTHROPLASTY;  Surgeon: Vickki Hearing, MD;  Location: [facility];  Service: Orthopedics;  Laterality: Right;   TOTAL KNEE REVISION Right 01/03/2013   Procedure: Patellaplasty Right Knee;  Surgeon: Vickki Hearing, MD;  Location: [facility];  Service: Orthopedics;  Laterality: Right;    Family History  Problem Relation Age of Onset   Alcohol abuse Father    Anxiety disorder Father    Alcohol abuse Mother    Anxiety disorder Mother    Alcohol abuse Brother    Anxiety disorder Brother    Drug abuse Brother    Drug abuse Brother    Alcohol abuse Brother    Anxiety disorder Brother    Seizures Brother    Alcohol abuse Brother    Anxiety disorder Brother    Seizures Brother    Alcohol abuse Brother    Anxiety disorder Brother    Alcohol abuse Brother    Anxiety disorder Brother    Breast cancer Sister    Bipolar disorder Sister    Breast cancer Sister    Dementia Paternal Aunt    ADD / ADHD Grandchild    ADD / ADHD Grandchild    Heart disease Other    Diabetes Other    Alcohol abuse Other    Thyroid disease Son    Thyroid disease Daughter    Depression Neg Hx    OCD Neg Hx    Paranoid behavior Neg Hx    Schizophrenia Neg Hx    Sexual abuse Neg Hx    Physical abuse Neg Hx    Colon cancer Neg Hx    Social History:  reports that she quit smoking about 18 years ago. Her smoking use included cigarettes. She has a 1.25 pack-year smoking history. She has never used smokeless tobacco. She reports that she does not currently use alcohol. She reports that she does not currently use drugs.  Allergies:  Allergies  Allergen Reactions   Neomycin-Bacitracin Zn-Polymyx Other (See Comments)    Reaction: skin starts to become raw and peels off   Penicillins Hives   Sulfonamide Derivatives Hives and Swelling    No medications prior to  admission.    No results found. However, due to the size of the patient record, not all encounters were searched. Please check Results Review for a complete set of results. No results found.  Review of Systems  All other systems reviewed and are negative.   Height 5\' 3"  (1.6 m), weight 97.5 kg. Physical Exam  Patient is alert, oriented, no adenopathy, well-dressed, normal affect, normal respiratory effort. Examination patient has a palpable pulse she does have Achilles contracture with dorsiflexion only to neutral.  She is tender to palpation across the Lisfranc complex.  Distraction across the midfoot is painful. Assessment/Plan 1. Traumatic arthritis of right foot   2. Lisfranc's sprain, right, sequela       Plan: Discussed  conservative treatment with a stiff soled shoe versus surgical fusion across the Lisfranc complex.  Risks and benefits were discussed patient states she understands wished to proceed with surgery at this time.  May also require a gastrocnemius recession.  Nadara Mustard, MD 03/31/2022, 6:46 AM

## 2022-03-31 NOTE — Anesthesia Procedure Notes (Signed)
Anesthesia Regional Block: Adductor canal block   Pre-Anesthetic Checklist: , timeout performed,  Correct Patient, Correct Site, Correct Laterality,  Correct Procedure, Correct Position, site marked,  Risks and benefits discussed,  Surgical consent,  Pre-op evaluation,  At surgeon's request and post-op pain management  Laterality: Right  Prep: chloraprep       Needles:  Injection technique: Single-shot  Needle Type: Echogenic Stimulator Needle     Needle Length: 10cm  Needle Gauge: 20     Additional Needles:   Procedures:,,,, ultrasound used (permanent image in chart),,    Narrative:  Start time: 03/31/2022 10:50 AM End time: 03/31/2022 10:54 AM Injection made incrementally with aspirations every 5 mL.  Performed by: Personally  Anesthesiologist: Lucretia Kern, MD  Additional Notes: Standard monitors applied. Skin prepped. Good needle visualization with ultrasound. Injection made in 5cc increments with no resistance to injection. Patient tolerated the procedure well.

## 2022-03-31 NOTE — Anesthesia Procedure Notes (Signed)
Procedure Name: MAC Date/Time: 03/31/2022 12:12 PM  Performed by: Amadeo Garnet, CRNAPre-anesthesia Checklist: Patient identified, Suction available, Emergency Drugs available and Patient being monitored Patient Re-evaluated:Patient Re-evaluated prior to induction Oxygen Delivery Method: Simple face mask Preoxygenation: Pre-oxygenation with 100% oxygen Induction Type: IV induction Placement Confirmation: positive ETCO2 Dental Injury: Teeth and Oropharynx as per pre-operative assessment

## 2022-04-03 ENCOUNTER — Encounter (HOSPITAL_COMMUNITY): Payer: Self-pay | Admitting: Orthopedic Surgery

## 2022-04-10 ENCOUNTER — Other Ambulatory Visit: Payer: Self-pay | Admitting: Orthopedic Surgery

## 2022-04-10 MED ORDER — OXYCODONE-ACETAMINOPHEN 5-325 MG PO TABS: 1.0000 | ORAL_TABLET | ORAL | 0 refills | Status: DC | PRN

## 2022-04-20 ENCOUNTER — Ambulatory Visit: Payer: Medicare Other | Admitting: Orthopedic Surgery

## 2022-04-20 ENCOUNTER — Encounter: Payer: Self-pay | Admitting: Orthopedic Surgery

## 2022-04-20 ENCOUNTER — Ambulatory Visit (INDEPENDENT_AMBULATORY_CARE_PROVIDER_SITE_OTHER): Payer: Medicare Other | Admitting: Orthopedic Surgery

## 2022-04-20 DIAGNOSIS — M12571 Traumatic arthropathy, right ankle and foot: Secondary | ICD-10-CM

## 2022-04-20 DIAGNOSIS — S93621S Sprain of tarsometatarsal ligament of right foot, sequela: Secondary | ICD-10-CM

## 2022-04-20 MED ORDER — OXYCODONE-ACETAMINOPHEN 5-325 MG PO TABS: 1.0000 | ORAL_TABLET | ORAL | 0 refills | Status: DC | PRN

## 2022-04-20 NOTE — Progress Notes (Signed)
Office Visit Note   Patient: Diane [name]           Date of Birth: 03/10/1951           MRN: [id number] Visit Date: 04/20/2022              Requested by: Elfredia Nevins, MD [address] [place],  [place] [address] PCP: Elfredia Nevins, MD  Chief Complaint  Patient presents with   Right Foot - Routine Post Op    03/31/22 right foot tarsal-MT fusion and right achilles lengthening      HPI: Patient is a 71 year old woman who presents 3 weeks status post Lisfranc fusion stabilization right midfoot.  Patient is currently weightbearing in a postoperative shoe with a cane.  Assessment & Plan: Visit Diagnoses:  1. Traumatic arthritis of right foot   2. Lisfranc's sprain, right, sequela     Plan: Three-view radiographs of the right foot at follow-up.  Recommend Achilles stretching.  Discussed that we can continue providing pain medicine for her foot.  Discussed that she would need to follow-up with her primary care physician for her other pain issues.  Follow-Up Instructions: Return in about 2 weeks (around 05/04/2022).   Ortho Exam  Patient is alert, oriented, no adenopathy, well-dressed, normal affect, normal respiratory effort. Examination patient has dorsiflexion to neutral.  The surgical incision is well-healed no redness no cellulitis no drainage we will harvest the sutures today.  Patient was given instructions regarding scar massage to prevent keloiding of the scar.  Imaging: No results found. No images are attached to the encounter.  Labs: Lab Results  Component Value Date   HGBA1C 5.5 02/19/2020   HGBA1C 5.8 (H) 01/25/2012   HGBA1C 5.7 07/13/2009   ESRSEDRATE 2 03/26/2018   ESRSEDRATE 4 03/16/2014   ESRSEDRATE 40 (H) 12/30/2012   CRP 0.8 03/26/2018   CRP <0.5 03/16/2014   CRP 1.6 (H) 12/30/2012   REPTSTATUS 10/31/2018 FINAL 10/28/2018   GRAMSTAIN  01/03/2013    RARE WBC PRESENT, PREDOMINANTLY MONONUCLEAR NO SQUAMOUS EPITHELIAL CELLS SEEN NO ORGANISMS  SEEN   GRAMSTAIN  01/03/2013    RARE WBC PRESENT, PREDOMINANTLY PMN NO SQUAMOUS EPITHELIAL CELLS SEEN NO ORGANISMS SEEN   GRAMSTAIN  01/03/2013    RARE WBC PRESENT,BOTH PMN AND MONONUCLEAR NO SQUAMOUS EPITHELIAL CELLS SEEN NO ORGANISMS SEEN   GRAMSTAIN  01/03/2013    RARE WBC PRESENT, PREDOMINANTLY PMN NO SQUAMOUS EPITHELIAL CELLS SEEN NO ORGANISMS SEEN   GRAMSTAIN  01/03/2013    RARE WBC PRESENT,BOTH PMN AND MONONUCLEAR NO SQUAMOUS EPITHELIAL CELLS SEEN NO ORGANISMS SEEN   GRAMSTAIN  01/03/2013    RARE WBC PRESENT, PREDOMINANTLY MONONUCLEAR NO SQUAMOUS EPITHELIAL CELLS SEEN NO ORGANISMS SEEN   CULT >=100,000 COLONIES/mL ESCHERICHIA COLI (A) 10/28/2018   LABORGA ESCHERICHIA COLI (A) 10/28/2018     Lab Results  Component Value Date   ALBUMIN 3.5 02/18/2020   ALBUMIN 3.4 (L) 11/10/2019   ALBUMIN 4.1 10/28/2018    Lab Results  Component Value Date   MG 1.7 02/18/2020   MG 2.2 10/28/2018   No results found for: "VD25OH"  No results found for: "PREALBUMIN"    Latest Ref Rng & Units 03/31/2022    9:37 AM 09/06/2021    3:19 PM 06/30/2020    2:32 PM  CBC EXTENDED  WBC 4.0 - 10.5 K/uL 5.2  6.0  8.6   RBC 3.87 - 5.11 MIL/uL 4.17  4.21  4.12   Hemoglobin 12.0 - 15.0 g/dL 19.1  47.8  11.7   HCT 36.0 - 46.0 % 39.7  37.6  36.2   Platelets 150 - 400 K/uL 158  182  235   NEUT# 1,500 - 7,800 cells/uL   4,180   Lymph# 850 - 3,900 cells/uL   3,861      There is no height or weight on file to calculate BMI.  Orders:  No orders of the defined types were placed in this encounter.  Meds ordered this encounter  Medications   oxyCODONE-acetaminophen (PERCOCET/ROXICET) 5-325 MG tablet    Sig: Take 1 tablet by mouth every 4 (four) hours as needed.    Dispense:  30 tablet    Refill:  0     Procedures: No procedures performed  Clinical Data: No additional findings.  ROS:  All other systems negative, except as noted in the HPI. Review of Systems  Objective: Vital  Signs: There were no vitals taken for this visit.  Specialty Comments:  No specialty comments available.  PMFS History: Patient Active Problem List   Diagnosis Date Noted   Primary osteoarthritis of right foot    Syncope 02/18/2020   Prolonged QT interval 02/18/2020   Glucose intolerance 02/18/2020   Normocytic anemia 12/10/2019   Poor appetite 09/24/2019   Abnormal weight loss 09/24/2019   Upper abdominal mass 09/24/2019   Dysphagia 01/30/2019   Simple adnexal cyst greater than 1 cm in diameter in postmenopausal patient 11/08/2018   Abdominal pain 08/09/2015   Dysphagia, pharyngoesophageal phase    H/O adenomatous polyp of colon 05/05/2015   Constipation 02/02/2015   Knee contusion 12/09/2013   Esophageal dysphagia 07/02/2013   Abdominal pain, epigastric 07/02/2013   Chronic pain syndrome 11/28/2012   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   Cellulitis 11/04/2012   Postoperative wound breakdown 11/04/2012   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   Knee pain 08/13/2012   Hypokalemia 08/01/2012   Cellulitis, wound, post-operative 07/30/2012   Degenerative tear of medial meniscus of right knee 01/12/2012   OA (osteoarthritis) of knee 01/12/2012   Right knee sprain 11/02/2011   Instability of knee joint 03/29/2011   URTICARIA 12/21/2009   OTHER URINARY INCONTINENCE 07/18/2009   FOOT PAIN, LEFT 05/26/2009   DERMATITIS, CHRONIC 10/21/2008   IMPAIRED GLUCOSE TOLERANCE 09/09/2008   Chronic back pain 09/09/2008   Hypothyroidism 02/27/2008   Hyperlipidemia 02/27/2008   Anxiety with depression 02/27/2008   Bipolar I disorder, most recent episode (or current) depressed, unspecified 02/27/2008   HYPERTENSION 02/27/2008   ALLERGIC RHINITIS 02/27/2008   GERD 02/27/2008   HEADACHE 02/27/2008   Past Medical History:  Diagnosis Date   Allergic rhinitis    Anxiety    Arthritis    Cataracts, bilateral    Chronic back pain    Sees pain mamagement clinic   Chronic  pain syndrome    Constipation    Depression    Essential hypertension    GERD (gastroesophageal reflux disease)    Headaches, cluster    History of seizures    Hypothyroid    Mania (HCC)    Mixed hyperlipidemia    PTSD (post-traumatic stress disorder)    Right lumbar radiculopathy    Sleep apnea    STOP BANG score=4   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(HCC)     Family History  Problem Relation Age of Onset   Alcohol abuse Father    Anxiety disorder Father    Alcohol abuse Mother    Anxiety disorder Mother    Alcohol abuse Brother  Anxiety disorder Brother    Drug abuse Brother    Drug abuse Brother    Alcohol abuse Brother    Anxiety disorder Brother    Seizures Brother    Alcohol abuse Brother    Anxiety disorder Brother    Seizures Brother    Alcohol abuse Brother    Anxiety disorder Brother    Alcohol abuse Brother    Anxiety disorder Brother    Breast cancer Sister    Bipolar disorder Sister    Breast cancer Sister    Dementia Paternal Aunt    ADD / ADHD Grandchild    ADD / ADHD Grandchild    Heart disease Other    Diabetes Other    Alcohol abuse Other    Thyroid disease Son    Thyroid disease Daughter    Depression Neg Hx    OCD Neg Hx    Paranoid behavior Neg Hx    Schizophrenia Neg Hx    Sexual abuse Neg Hx    Physical abuse Neg Hx    Colon cancer Neg Hx     Past Surgical History:  Procedure Laterality Date   BACK SURGERY     BILATERAL OOPHORECTOMY     CATARACT EXTRACTION W/PHACO Left 01/04/2016   Procedure: CATARACT EXTRACTION PHACO AND INTRAOCULAR LENS PLACEMENT (IOC);  Surgeon: Jethro Bolus, MD;  Location: [facility];  Service: Ophthalmology;  Laterality: Left;  CDE:4.70   CATARACT EXTRACTION W/PHACO Right 01/18/2016   Procedure: CATARACT EXTRACTION PHACO AND INTRAOCULAR LENS PLACEMENT RIGHT EYE; CDE:  4.66;  Surgeon: Jethro Bolus, MD;  Location: [facility];  Service: Ophthalmology;  Laterality: Right;   CHOLECYSTECTOMY     COLONOSCOPY  10/16/2008    HFS:[id number], otherwise normal rectum; pancolonic diverticula the remainder of the colonic mucosa appeared normal. Next TCS due 10/2013.   COLONOSCOPY  06/28/2006   RMR: Internal hemorrhoids. Diminutive rectal polyps, cold biopsied/ Pancolonic diverticula. Polyps in the right colon removed    COLONOSCOPY WITH PROPOFOL N/A 05/20/2015   RMR: Pancolonic diverticulosis. Redundant colon    COLONOSCOPY WITH PROPOFOL N/A 11/11/2020   Procedure: COLONOSCOPY WITH PROPOFOL;  Surgeon: Corbin Ade, MD;  [facility]: [facility];  Service: Endoscopy;  Laterality: N/A;  pm ASA 2   ESOPHAGOGASTRODUODENOSCOPY  10/16/2008   XID:HWYSH hiatal hernia otherwise normal esophagus, stomach  D1, D2, status post passage of a 56-French Maloney dilator   ESOPHAGOGASTRODUODENOSCOPY  06/28/2006   UOH:FGBMSX esophagus. Small hiatal hernia. Otherwise normal stomach, D1 and D2, status post passage of a 56 French Maloney dilator   ESOPHAGOGASTRODUODENOSCOPY (EGD) WITH ESOPHAGEAL DILATION N/A 07/14/2013   RMR: Abnormal esophagus of uncertain significance-status post esophageal biospy. small hiatal hernia.    ESOPHAGOGASTRODUODENOSCOPY (EGD) WITH PROPOFOL N/A 05/20/2015   RMR: NORMAL EGD status post maloney dilation    ESOPHAGOGASTRODUODENOSCOPY (EGD) WITH PROPOFOL N/A 12/23/2018   Dr. Jena Gauss: normal esophagus, s/p esophageal dilation.    FOOT ARTHRODESIS Right 03/31/2022   Procedure: RIGHT FOOT TARSAL-METATARSAL FUSION;  Surgeon: Nadara Mustard, MD;  Location: [facility] OR;  Service: Orthopedics;  Laterality: Right;   FOOT SURGERY     left foot-   GASTROCNEMIUS RECESSION Right 03/31/2022   Procedure: RIGHT ACHILLES LENGTHENING;  Surgeon: Nadara Mustard, MD;  Location: [facility] OR;  Service: Orthopedics;  Laterality: Right;   INCISION AND DRAINAGE Right 01/03/2013   Procedure: INCISION AND DRAINAGE;  Surgeon: Vickki Hearing, MD;  Location: [facility];  Service: Orthopedics;  Laterality: Right;   KNEE ARTHROSCOPY  01/2012  right   KNEE  ARTHROSCOPY WITH LATERAL RELEASE Right 01/03/2013   Procedure: Lateral Release Patella Right Knee;  Surgeon: Vickki Hearing, MD;  Location: [facility];  Service: Orthopedics;  Laterality: Right;   LAPAROSCOPIC LYSIS OF ADHESIONS N/A 11/12/2019   Procedure: LAPAROSCOPIC LYSIS OF ADHESIONS, extensive; drainage of peritoneal cyst;  Surgeon: Lazaro Arms, MD;  Location: [facility];  Service: Gynecology;  Laterality: N/A;   LUMBAR DISC SURGERY  L4-5   MALONEY DILATION N/A 05/20/2015   Procedure: Elease Hashimoto DILATION;  Surgeon: Corbin Ade, MD;  Location: [facility];  Service: Endoscopy;  Laterality: N/AElease Hashimoto dilator # 54   MALONEY DILATION N/A 12/23/2018   Procedure: Elease Hashimoto DILATION;  Surgeon: Corbin Ade, MD;  [facility];  Service: Endoscopy;  Laterality: N/A;   PARTIAL HYSTERECTOMY     TOTAL KNEE ARTHROPLASTY  07/01/2012   Procedure: TOTAL KNEE ARTHROPLASTY;  Surgeon: Vickki Hearing, MD;  Location: [facility];  Service: Orthopedics;  Laterality: Right;   TOTAL KNEE REVISION Right 01/03/2013   Procedure: Patellaplasty Right Knee;  Surgeon: Vickki Hearing, MD;  Location: [facility];  Service: Orthopedics;  Laterality: Right;   Social History   Occupational History   Occupation: disabled    Associate Professor: UNEMPLOYED  Tobacco Use   Smoking status: Former    Packs/day: 0.25    Years: 5.00    Total pack years: 1.25    Types: Cigarettes    Quit date: 06/26/2003    Years since quitting: 18.8   Smokeless tobacco: Never  Vaping Use   Vaping Use: Never used  Substance and Sexual Activity   Alcohol use: Not Currently    Comment: occasionally; once a month.   Drug use: Not Currently    Comment: has a past history of street drug use   Sexual activity: Yes    Birth control/protection: Surgical    Comment: hyst

## 2022-04-27 ENCOUNTER — Other Ambulatory Visit (HOSPITAL_COMMUNITY): Payer: Self-pay | Admitting: Psychiatry

## 2022-04-27 NOTE — Telephone Encounter (Signed)
Call for appt

## 2022-04-28 ENCOUNTER — Other Ambulatory Visit: Payer: Self-pay | Admitting: Orthopedic Surgery

## 2022-05-02 ENCOUNTER — Other Ambulatory Visit: Payer: Self-pay | Admitting: *Deleted

## 2022-05-02 NOTE — Patient Outreach (Signed)
  Care Coordination   05/02/2022 Name: Diane [name] MRN: [id number] DOB: 05/26/1951   Care Coordination Outreach Attempts:  An unsuccessful telephone outreach was attempted today to offer the patient information about available care coordination services as a benefit of their health plan.   Follow Up Plan:  Additional outreach attempts will be made to offer the patient care coordination information and services.   Encounter Outcome:  No Answer  Care Coordination Interventions Activated:  No   Care Coordination Interventions:  No, not indicated    Kemper Durie, RN, MSN, [facility] Care Coordinator [phone]

## 2022-05-04 ENCOUNTER — Encounter: Payer: Self-pay | Admitting: Orthopedic Surgery

## 2022-05-04 ENCOUNTER — Ambulatory Visit (INDEPENDENT_AMBULATORY_CARE_PROVIDER_SITE_OTHER): Payer: Medicare Other | Admitting: Orthopedic Surgery

## 2022-05-04 ENCOUNTER — Ambulatory Visit: Payer: Self-pay

## 2022-05-04 DIAGNOSIS — M12571 Traumatic arthropathy, right ankle and foot: Secondary | ICD-10-CM

## 2022-05-04 MED ORDER — OXYCODONE-ACETAMINOPHEN 5-325 MG PO TABS: 1.0000 | ORAL_TABLET | ORAL | 0 refills | Status: DC | PRN

## 2022-05-04 NOTE — Progress Notes (Signed)
Office Visit Note   Patient: Diane [name]           Date of Birth: 1951/06/15           MRN: [id number] Visit Date: 05/04/2022              Requested by: Elfredia Nevins, MD [address] [place],  [place] [address] PCP: Elfredia Nevins, MD  Chief Complaint  Patient presents with   Right Foot - Routine Post Op    03/31/22 right foot tarsal-MT fusion and right achilles lengthening      HPI: Patient is a 71 year old woman who is status post right midfoot fusion.  Patient states she still has midfoot pain.  Assessment & Plan: Visit Diagnoses:  1. Traumatic arthritis of right foot     Plan: We will set her up for physical therapy for range of motion ankle and strengthening of the foot and ankle.  She will continue with her postoperative shoe.  Three-view radiographs of the right foot at follow-up.  Follow-Up Instructions: Return in about 4 weeks (around 06/01/2022).   Ortho Exam  Patient is alert, oriented, no adenopathy, well-dressed, normal affect, normal respiratory effort. Examination patient's incision is nicely healed there is no keloiding.  Her swelling is decreased she has dorsiflexion of the ankle just past neutral.  There is no redness no cellulitis no drainage no signs of infection.  Imaging: No results found. No images are attached to the encounter.  Labs: Lab Results  Component Value Date   HGBA1C 5.5 02/19/2020   HGBA1C 5.8 (H) 01/25/2012   HGBA1C 5.7 07/13/2009   ESRSEDRATE 2 03/26/2018   ESRSEDRATE 4 03/16/2014   ESRSEDRATE 40 (H) 12/30/2012   CRP 0.8 03/26/2018   CRP <0.5 03/16/2014   CRP 1.6 (H) 12/30/2012   REPTSTATUS 10/31/2018 FINAL 10/28/2018   GRAMSTAIN  01/03/2013    RARE WBC PRESENT, PREDOMINANTLY MONONUCLEAR NO SQUAMOUS EPITHELIAL CELLS SEEN NO ORGANISMS SEEN   GRAMSTAIN  01/03/2013    RARE WBC PRESENT, PREDOMINANTLY PMN NO SQUAMOUS EPITHELIAL CELLS SEEN NO ORGANISMS SEEN   GRAMSTAIN  01/03/2013    RARE WBC PRESENT,BOTH PMN  AND MONONUCLEAR NO SQUAMOUS EPITHELIAL CELLS SEEN NO ORGANISMS SEEN   GRAMSTAIN  01/03/2013    RARE WBC PRESENT, PREDOMINANTLY PMN NO SQUAMOUS EPITHELIAL CELLS SEEN NO ORGANISMS SEEN   GRAMSTAIN  01/03/2013    RARE WBC PRESENT,BOTH PMN AND MONONUCLEAR NO SQUAMOUS EPITHELIAL CELLS SEEN NO ORGANISMS SEEN   GRAMSTAIN  01/03/2013    RARE WBC PRESENT, PREDOMINANTLY MONONUCLEAR NO SQUAMOUS EPITHELIAL CELLS SEEN NO ORGANISMS SEEN   CULT >=100,000 COLONIES/mL ESCHERICHIA COLI (A) 10/28/2018   LABORGA ESCHERICHIA COLI (A) 10/28/2018     Lab Results  Component Value Date   ALBUMIN 3.5 02/18/2020   ALBUMIN 3.4 (L) 11/10/2019   ALBUMIN 4.1 10/28/2018    Lab Results  Component Value Date   MG 1.7 02/18/2020   MG 2.2 10/28/2018   No results found for: "VD25OH"  No results found for: "PREALBUMIN"    Latest Ref Rng & Units 03/31/2022    9:37 AM 09/06/2021    3:19 PM 06/30/2020    2:32 PM  CBC EXTENDED  WBC 4.0 - 10.5 K/uL 5.2  6.0  8.6   RBC 3.87 - 5.11 MIL/uL 4.17  4.21  4.12   Hemoglobin 12.0 - 15.0 g/dL 15.8  68.2  57.4   HCT 36.0 - 46.0 % 39.7  37.6  36.2   Platelets 150 - 400 K/uL  158  182  235   NEUT# 1,500 - 7,800 cells/uL   4,180   Lymph# 850 - 3,900 cells/uL   3,861      There is no height or weight on file to calculate BMI.  Orders:  Orders Placed This Encounter  Procedures   XR Foot Complete Right   Ambulatory referral to Physical Therapy   Meds ordered this encounter  Medications   oxyCODONE-acetaminophen (PERCOCET/ROXICET) 5-325 MG tablet    Sig: Take 1 tablet by mouth every 4 (four) hours as needed.    Dispense:  30 tablet    Refill:  0     Procedures: No procedures performed  Clinical Data: No additional findings.  ROS:  All other systems negative, except as noted in the HPI. Review of Systems  Objective: Vital Signs: There were no vitals taken for this visit.  Specialty Comments:  No specialty comments available.  PMFS  History: Patient Active Problem List   Diagnosis Date Noted   Primary osteoarthritis of right foot    Syncope 02/18/2020   Prolonged QT interval 02/18/2020   Glucose intolerance 02/18/2020   Normocytic anemia 12/10/2019   Poor appetite 09/24/2019   Abnormal weight loss 09/24/2019   Upper abdominal mass 09/24/2019   Dysphagia 01/30/2019   Simple adnexal cyst greater than 1 cm in diameter in postmenopausal patient 11/08/2018   Abdominal pain 08/09/2015   Dysphagia, pharyngoesophageal phase    H/O adenomatous polyp of colon 05/05/2015   Constipation 02/02/2015   Knee contusion 12/09/2013   Esophageal dysphagia 07/02/2013   Abdominal pain, epigastric 07/02/2013   Chronic pain syndrome 11/28/2012   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   Cellulitis 11/04/2012   Postoperative wound breakdown 11/04/2012   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   Knee pain 08/13/2012   Hypokalemia 08/01/2012   Cellulitis, wound, post-operative 07/30/2012   Degenerative tear of medial meniscus of right knee 01/12/2012   OA (osteoarthritis) of knee 01/12/2012   Right knee sprain 11/02/2011   Instability of knee joint 03/29/2011   URTICARIA 12/21/2009   OTHER URINARY INCONTINENCE 07/18/2009   FOOT PAIN, LEFT 05/26/2009   DERMATITIS, CHRONIC 10/21/2008   IMPAIRED GLUCOSE TOLERANCE 09/09/2008   Chronic back pain 09/09/2008   Hypothyroidism 02/27/2008   Hyperlipidemia 02/27/2008   Anxiety with depression 02/27/2008   Bipolar I disorder, most recent episode (or current) depressed, unspecified 02/27/2008   HYPERTENSION 02/27/2008   ALLERGIC RHINITIS 02/27/2008   GERD 02/27/2008   HEADACHE 02/27/2008   Past Medical History:  Diagnosis Date   Allergic rhinitis    Anxiety    Arthritis    Cataracts, bilateral    Chronic back pain    Sees pain mamagement clinic   Chronic pain syndrome    Constipation    Depression    Essential hypertension    GERD (gastroesophageal reflux disease)     Headaches, cluster    History of seizures    Hypothyroid    Mania (HCC)    Mixed hyperlipidemia    PTSD (post-traumatic stress disorder)    Right lumbar radiculopathy    Sleep apnea    STOP BANG score=4   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(HCC)     Family History  Problem Relation Age of Onset   Alcohol abuse Father    Anxiety disorder Father    Alcohol abuse Mother    Anxiety disorder Mother    Alcohol abuse Brother    Anxiety disorder Brother    Drug abuse Brother  Drug abuse Brother    Alcohol abuse Brother    Anxiety disorder Brother    Seizures Brother    Alcohol abuse Brother    Anxiety disorder Brother    Seizures Brother    Alcohol abuse Brother    Anxiety disorder Brother    Alcohol abuse Brother    Anxiety disorder Brother    Breast cancer Sister    Bipolar disorder Sister    Breast cancer Sister    Dementia Paternal Aunt    ADD / ADHD Grandchild    ADD / ADHD Grandchild    Heart disease Other    Diabetes Other    Alcohol abuse Other    Thyroid disease Son    Thyroid disease Daughter    Depression Neg Hx    OCD Neg Hx    Paranoid behavior Neg Hx    Schizophrenia Neg Hx    Sexual abuse Neg Hx    Physical abuse Neg Hx    Colon cancer Neg Hx     Past Surgical History:  Procedure Laterality Date   BACK SURGERY     BILATERAL OOPHORECTOMY     CATARACT EXTRACTION W/PHACO Left 01/04/2016   Procedure: CATARACT EXTRACTION PHACO AND INTRAOCULAR LENS PLACEMENT (IOC);  Surgeon: Jethro Bolus, MD;  Location: [facility];  Service: Ophthalmology;  Laterality: Left;  CDE:4.70   CATARACT EXTRACTION W/PHACO Right 01/18/2016   Procedure: CATARACT EXTRACTION PHACO AND INTRAOCULAR LENS PLACEMENT RIGHT EYE; CDE:  4.66;  Surgeon: Jethro Bolus, MD;  Location: [facility];  Service: Ophthalmology;  Laterality: Right;   CHOLECYSTECTOMY     COLONOSCOPY  10/16/2008   OVF:[id number], otherwise normal rectum; pancolonic diverticula the remainder of the colonic mucosa appeared normal. Next  TCS due 10/2013.   COLONOSCOPY  06/28/2006   RMR: Internal hemorrhoids. Diminutive rectal polyps, cold biopsied/ Pancolonic diverticula. Polyps in the right colon removed    COLONOSCOPY WITH PROPOFOL N/A 05/20/2015   RMR: Pancolonic diverticulosis. Redundant colon    COLONOSCOPY WITH PROPOFOL N/A 11/11/2020   Procedure: COLONOSCOPY WITH PROPOFOL;  Surgeon: Corbin Ade, MD;  [facility];  Service: Endoscopy;  Laterality: N/A;  pm ASA 2   ESOPHAGOGASTRODUODENOSCOPY  10/16/2008   ZYS:AYTKZ hiatal hernia otherwise normal esophagus, stomach  D1, D2, status post passage of a 56-French Maloney dilator   ESOPHAGOGASTRODUODENOSCOPY  06/28/2006   SWF:UXNATF esophagus. Small hiatal hernia. Otherwise normal stomach, D1 and D2, status post passage of a 56 French Maloney dilator   ESOPHAGOGASTRODUODENOSCOPY (EGD) WITH ESOPHAGEAL DILATION N/A 07/14/2013   RMR: Abnormal esophagus of uncertain significance-status post esophageal biospy. small hiatal hernia.    ESOPHAGOGASTRODUODENOSCOPY (EGD) WITH PROPOFOL N/A 05/20/2015   RMR: NORMAL EGD status post maloney dilation    ESOPHAGOGASTRODUODENOSCOPY (EGD) WITH PROPOFOL N/A 12/23/2018   Dr. Jena Gauss: normal esophagus, s/p esophageal dilation.    FOOT ARTHRODESIS Right 03/31/2022   Procedure: RIGHT FOOT TARSAL-METATARSAL FUSION;  Surgeon: Nadara Mustard, MD;  Location: [facility] OR;  Service: Orthopedics;  Laterality: Right;   FOOT SURGERY     left foot-   GASTROCNEMIUS RECESSION Right 03/31/2022   Procedure: RIGHT ACHILLES LENGTHENING;  Surgeon: Nadara Mustard, MD;  Location: [facility] OR;  Service: Orthopedics;  Laterality: Right;   INCISION AND DRAINAGE Right 01/03/2013   Procedure: INCISION AND DRAINAGE;  Surgeon: Vickki Hearing, MD;  Location: [facility];  Service: Orthopedics;  Laterality: Right;   KNEE ARTHROSCOPY  01/2012   right   KNEE ARTHROSCOPY WITH LATERAL RELEASE Right 01/03/2013  Procedure: Lateral Release Patella Right Knee;  Surgeon: Vickki Hearing, MD;   Location: [facility];  Service: Orthopedics;  Laterality: Right;   LAPAROSCOPIC LYSIS OF ADHESIONS N/A 11/12/2019   Procedure: LAPAROSCOPIC LYSIS OF ADHESIONS, extensive; drainage of peritoneal cyst;  Surgeon: Lazaro Arms, MD;  Location: [facility];  Service: Gynecology;  Laterality: N/A;   LUMBAR DISC SURGERY  L4-5   MALONEY DILATION N/A 05/20/2015   Procedure: Elease Hashimoto DILATION;  Surgeon: Corbin Ade, MD;  Location: [facility];  Service: Endoscopy;  Laterality: N/AElease Hashimoto dilator # 54   MALONEY DILATION N/A 12/23/2018   Procedure: Elease Hashimoto DILATION;  Surgeon: Corbin Ade, MD;  [facility];  Service: Endoscopy;  Laterality: N/A;   PARTIAL HYSTERECTOMY     TOTAL KNEE ARTHROPLASTY  07/01/2012   Procedure: TOTAL KNEE ARTHROPLASTY;  Surgeon: Vickki Hearing, MD;  Location: [facility];  Service: Orthopedics;  Laterality: Right;   TOTAL KNEE REVISION Right 01/03/2013   Procedure: Patellaplasty Right Knee;  Surgeon: Vickki Hearing, MD;  Location: [facility];  Service: Orthopedics;  Laterality: Right;   Social History   Occupational History   Occupation: disabled    Associate Professor: UNEMPLOYED  Tobacco Use   Smoking status: Former    Packs/day: 0.25    Years: 5.00    Total pack years: 1.25    Types: Cigarettes    Quit date: 06/26/2003    Years since quitting: 18.8   Smokeless tobacco: Never  Vaping Use   Vaping Use: Never used  Substance and Sexual Activity   Alcohol use: Not Currently    Comment: occasionally; once a month.   Drug use: Not Currently    Comment: has a past history of street drug use   Sexual activity: Yes    Birth control/protection: Surgical    Comment: hyst

## 2022-05-05 ENCOUNTER — Other Ambulatory Visit: Payer: Self-pay | Admitting: *Deleted

## 2022-05-05 ENCOUNTER — Encounter: Payer: Self-pay | Admitting: *Deleted

## 2022-05-05 NOTE — Patient Instructions (Signed)
Visit Information  Thank you for taking time to visit with me today. Please don't hesitate to contact me if I can be of assistance to you before our next scheduled telephone appointment.  Following are the goals we discussed today:  Confirm with PCP that AWV was completed this year.  Our next appointment is by telephone on 9/26  Please call the care guide team at [phone] if you need to cancel or reschedule your appointment.   Please call the Suicide and Crisis Lifeline: 988 call the [place] National Suicide Prevention Lifeline: 934-719-2332 or TTY: 437-417-5819 TTY 920-216-9746) to talk to a trained counselor call 1-800-273-TALK (toll free, 24 hour hotline) call the [facility]: [phone] call 911 if you are experiencing a Mental Health or Behavioral Health Crisis or need someone to talk to.  Patient verbalizes understanding of instructions and care plan provided today and agrees to view in MyChart. Active MyChart status and patient understanding of how to access instructions and care plan via MyChart confirmed with patient.     The patient has been provided with contact information for the care management team and has been advised to call with any health related questions or concerns.   Kemper Durie, RN, MSN, [facility] Care Management Care Management Coordinator [phone]

## 2022-05-05 NOTE — Patient Outreach (Signed)
  Care Coordination   Initial Visit Note   05/05/2022 Name: Diane [name] MRN: [id number] DOB: [birth year]  Diane [name] is a 71 y.o. year old female who sees Elfredia Nevins, MD for primary care. I spoke with  Diane [name] by phone today.  What matters to the patients health and wellness today?  Having pain in right foot, post surgery.  Was taking Percocet, ran out and pharmacy has been unable to obtain prescribed dose for refill.  They are working with provider to get medication filled, advised to inquire about getting from a different pharmacy.  She had follow up with ortho yesterday, waiting for outpatient therapy to call to schedule sessions.    Report last seen at PCP office on 8/8, unsure when her last AWV was.  Will collaborate with PCP office to schedule if not done.    Goals Addressed               This Visit's Progress     Recover from foot surgery (pt-stated)        Care Coordination Interventions: Reviewed provider established plan for pain management Discussed importance of adherence to all scheduled medical appointments Reviewed with patient prescribed pharmacological and nonpharmacological pain relief strategies Advised patient to discuss scheduling AWV with provider Assessed social determinant of health barriers         SDOH assessments and interventions completed:  Yes  SDOH Interventions Today    Flowsheet Row Most Recent Value  SDOH Interventions   Food Insecurity Interventions Intervention Not Indicated  Housing Interventions Intervention Not Indicated  Transportation Interventions Intervention Not Indicated        Care Coordination Interventions Activated:  Yes  Care Coordination Interventions:  Yes, provided   Follow up plan: Follow up call scheduled for 9/26    Encounter Outcome:  Pt. Visit Completed   Kemper Durie, RN, MSN, [facility] Care Management Care Management Coordinator [phone]

## 2022-05-08 ENCOUNTER — Other Ambulatory Visit: Payer: Self-pay | Admitting: Orthopedic Surgery

## 2022-05-08 ENCOUNTER — Other Ambulatory Visit (HOSPITAL_COMMUNITY): Payer: Self-pay | Admitting: Psychiatry

## 2022-05-08 ENCOUNTER — Telehealth: Payer: Self-pay | Admitting: Orthopedic Surgery

## 2022-05-08 ENCOUNTER — Other Ambulatory Visit: Payer: Self-pay | Admitting: Gastroenterology

## 2022-05-08 DIAGNOSIS — F5105 Insomnia due to other mental disorder: Secondary | ICD-10-CM

## 2022-05-08 MED ORDER — OXYCODONE HCL 5 MG PO CAPS: 5.0000 mg | ORAL_CAPSULE | Freq: Four times a day (QID) | ORAL | 0 refills | Status: DC | PRN

## 2022-05-08 NOTE — Telephone Encounter (Signed)
Call for appt

## 2022-05-08 NOTE — Telephone Encounter (Signed)
Please see below.

## 2022-05-08 NOTE — Telephone Encounter (Signed)
This is being taken care of. Got a call from [facility] on this matter and note sent to Dr. Lajoyce Corners.

## 2022-05-08 NOTE — Telephone Encounter (Signed)
Patient called advised [facility] do not have Oxycodone in stock. Patient said the pharmacy asked what other pain medicine can be sent to them for her? The number to contact patient is [phone]

## 2022-05-08 NOTE — Telephone Encounter (Signed)
Courtney at [facility] called and states they are out of percocet 5-325 so she would like to know if you want to change it to regular 5s or 10-325?   Cb [phone]

## 2022-05-19 ENCOUNTER — Other Ambulatory Visit: Payer: Self-pay | Admitting: Orthopedic Surgery

## 2022-05-19 MED ORDER — OXYCODONE HCL 5 MG PO CAPS: 5.0000 mg | ORAL_CAPSULE | Freq: Four times a day (QID) | ORAL | 0 refills | Status: DC | PRN

## 2022-05-30 ENCOUNTER — Other Ambulatory Visit (HOSPITAL_COMMUNITY): Payer: Self-pay | Admitting: Psychiatry

## 2022-05-30 DIAGNOSIS — F3130 Bipolar disorder, current episode depressed, mild or moderate severity, unspecified: Secondary | ICD-10-CM

## 2022-05-30 NOTE — Telephone Encounter (Signed)
Call for appt

## 2022-05-31 ENCOUNTER — Encounter: Payer: Self-pay | Admitting: *Deleted

## 2022-06-01 ENCOUNTER — Ambulatory Visit (INDEPENDENT_AMBULATORY_CARE_PROVIDER_SITE_OTHER): Payer: Medicare Other

## 2022-06-01 ENCOUNTER — Encounter (HOSPITAL_COMMUNITY): Payer: Self-pay | Admitting: Psychiatry

## 2022-06-01 ENCOUNTER — Telehealth (INDEPENDENT_AMBULATORY_CARE_PROVIDER_SITE_OTHER): Payer: Medicare Other | Admitting: Psychiatry

## 2022-06-01 ENCOUNTER — Encounter: Payer: Self-pay | Admitting: Orthopedic Surgery

## 2022-06-01 ENCOUNTER — Ambulatory Visit (INDEPENDENT_AMBULATORY_CARE_PROVIDER_SITE_OTHER): Payer: Medicare Other | Admitting: Orthopedic Surgery

## 2022-06-01 DIAGNOSIS — F3130 Bipolar disorder, current episode depressed, mild or moderate severity, unspecified: Secondary | ICD-10-CM | POA: Diagnosis not present

## 2022-06-01 DIAGNOSIS — F5105 Insomnia due to other mental disorder: Secondary | ICD-10-CM

## 2022-06-01 DIAGNOSIS — S93621D Sprain of tarsometatarsal ligament of right foot, subsequent encounter: Secondary | ICD-10-CM

## 2022-06-01 MED ORDER — OXYCODONE-ACETAMINOPHEN 5-325 MG PO TABS: 1.0000 | ORAL_TABLET | ORAL | 0 refills | Status: DC | PRN

## 2022-06-01 MED ORDER — ALPRAZOLAM 0.5 MG PO TABS: 0.5000 mg | ORAL_TABLET | Freq: Three times a day (TID) | ORAL | 2 refills | Status: DC

## 2022-06-01 MED ORDER — TRAZODONE HCL 50 MG PO TABS: 50.0000 mg | ORAL_TABLET | Freq: Every day | ORAL | 2 refills | Status: DC

## 2022-06-01 MED ORDER — DIVALPROEX SODIUM 125 MG PO DR TAB: DELAYED_RELEASE_TABLET | ORAL | 2 refills | Status: DC

## 2022-06-01 MED ORDER — DULOXETINE HCL 60 MG PO CPEP: 60.0000 mg | ORAL_CAPSULE | Freq: Every day | ORAL | 2 refills | Status: DC

## 2022-06-01 NOTE — Progress Notes (Signed)
Virtual Visit via Telephone Note  I connected with Diane [name] on 06/01/22 at 11:40 AM EDT by telephone and verified that I am speaking with the correct person using two identifiers.  Location: Patient: home Provider: office   I discussed the limitations, risks, security and privacy concerns of performing an evaluation and management service by telephone and the availability of in person appointments. I also discussed with the patient that there may be a patient responsible charge related to this service. The patient expressed understanding and agreed to proceed.      I discussed the assessment and treatment plan with the patient. The patient was provided an opportunity to ask questions and all were answered. The patient agreed with the plan and demonstrated an understanding of the instructions.   The patient was advised to call back or seek an in-person evaluation if the symptoms worsen or if the condition fails to improve as anticipated.  I provided 15 minutes of non-face-to-face time during this encounter.   Levonne Spiller, MD  [facility] MD/PA/NP OP Progress Note  06/01/2022 12:04 PM Corky Sing [place]  MRN:  [id number]  Chief Complaint:  Chief Complaint  Patient presents with   Depression   Anxiety   Follow-up   HPI: This patient is a 42 year old black female who is living with her ex-boyfriend in [place].  She is on disability.  The patient returns for follow-up after 3 months regarding her mood swings depression and anxiety.  Last time she told me that she was still having problems with her right ankle.  She has had another surgery with an orthopedic in [place].  This was done in July.  Unfortunately she is still having a lot of pain and trouble walking on it.  She is going back today.  She does get Percocet for this but she claims its not helping the pain at the current dosage.  She denies significant depression or anxiety.  However she states at 1 point over the summer she stopped all  of her psychiatric medications but within a couple of weeks she was very depressed and staying in bed all the time and she restarted them.  She denies any manic symptoms or severe depression or thoughts of self-harm or suicide. Visit Diagnosis:    ICD-10-CM   1. Bipolar I disorder, most recent episode depressed ([place])  F31.30 divalproex (DEPAKOTE) 125 MG DR tablet    2. 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 At that time she was using drugs and alcohol. She has been in outpatient care ever since  Past Medical History:  Past Medical History:  Diagnosis Date   Allergic rhinitis    Anxiety    Arthritis    Cataracts, bilateral    Chronic back pain    Sees pain mamagement clinic   Chronic pain syndrome    Constipation    Depression    Essential hypertension    GERD (gastroesophageal reflux disease)    Headaches, cluster    History of seizures    Hypothyroid    Mania (HCC)    Mixed hyperlipidemia    PTSD (post-traumatic stress disorder)    Right lumbar radiculopathy    Sleep apnea    STOP BANG score=4   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[facility])     Past Surgical History:  Procedure Laterality Date  BACK SURGERY     BILATERAL OOPHORECTOMY     CATARACT EXTRACTION W/PHACO Left 01/04/2016   Procedure: CATARACT EXTRACTION PHACO AND INTRAOCULAR LENS PLACEMENT ([place]);  Surgeon: Rutherford Guys, MD;  Location: [facility];  Service: Ophthalmology;  Laterality: Left;  CDE:4.70   CATARACT EXTRACTION W/PHACO Right 01/18/2016   Procedure: CATARACT EXTRACTION PHACO AND INTRAOCULAR LENS PLACEMENT RIGHT EYE; CDE:  4.66;  Surgeon: Rutherford Guys, MD;  Location: [facility];  Service: Ophthalmology;  Laterality: Right;   CHOLECYSTECTOMY     COLONOSCOPY  10/16/2008   [id number], otherwise normal rectum; pancolonic diverticula the remainder of the  colonic mucosa appeared normal. Next TCS due 10/2013.   COLONOSCOPY  06/28/2006   RMR: Internal hemorrhoids. Diminutive rectal polyps, cold biopsied/ Pancolonic diverticula. Polyps in the right colon removed    COLONOSCOPY WITH PROPOFOL N/A 05/20/2015   RMR: Pancolonic diverticulosis. Redundant colon    COLONOSCOPY WITH PROPOFOL N/A 11/11/2020   Procedure: COLONOSCOPY WITH PROPOFOL;  Surgeon: Daneil Dolin, MD;  [facility];  Service: Endoscopy;  Laterality: N/A;  pm ASA 2   ESOPHAGOGASTRODUODENOSCOPY  10/16/2008   [id number] hiatal hernia otherwise normal esophagus, stomach  D1, D2, status post passage of a 56-French Maloney dilator   ESOPHAGOGASTRODUODENOSCOPY  06/28/2006   [id number] esophagus. Small hiatal hernia. Otherwise normal stomach, D1 and D2, status post passage of a 56 French Maloney dilator   ESOPHAGOGASTRODUODENOSCOPY (EGD) WITH ESOPHAGEAL DILATION N/A 07/14/2013   RMR: Abnormal esophagus of uncertain significance-status post esophageal biospy. small hiatal hernia.    ESOPHAGOGASTRODUODENOSCOPY (EGD) WITH PROPOFOL N/A 05/20/2015   RMR: NORMAL EGD status post maloney dilation    ESOPHAGOGASTRODUODENOSCOPY (EGD) WITH PROPOFOL N/A 12/23/2018   Dr. Gala Romney: normal esophagus, s/p esophageal dilation.    FOOT ARTHRODESIS Right 03/31/2022   Procedure: RIGHT FOOT TARSAL-METATARSAL FUSION;  Surgeon: Newt Minion, MD;  Location: [place];  Service: Orthopedics;  Laterality: Right;   FOOT SURGERY     left foot-   GASTROCNEMIUS RECESSION Right 03/31/2022   Procedure: RIGHT ACHILLES LENGTHENING;  Surgeon: Newt Minion, MD;  Location: [place];  Service: Orthopedics;  Laterality: Right;   INCISION AND DRAINAGE Right 01/03/2013   Procedure: INCISION AND DRAINAGE;  Surgeon: Carole Civil, MD;  Location: [facility];  Service: Orthopedics;  Laterality: Right;   KNEE ARTHROSCOPY  01/2012   right   KNEE ARTHROSCOPY WITH LATERAL RELEASE Right 01/03/2013   Procedure: Lateral Release Patella Right  Knee;  Surgeon: Carole Civil, MD;  Location: [facility];  Service: Orthopedics;  Laterality: Right;   LAPAROSCOPIC LYSIS OF ADHESIONS N/A 11/12/2019   Procedure: LAPAROSCOPIC LYSIS OF ADHESIONS, extensive; drainage of peritoneal cyst;  Surgeon: Florian Buff, MD;  Location: [facility];  Service: Gynecology;  Laterality: N/A;   LUMBAR DISC SURGERY  L4-5   MALONEY DILATION N/A 05/20/2015   Procedure: Venia Minks DILATION;  Surgeon: Daneil Dolin, MD;  Location: [facility];  Service: Endoscopy;  Laterality: N/AVenia Minks dilator # 34   MALONEY DILATION N/A 12/23/2018   Procedure: Venia Minks DILATION;  Surgeon: Daneil Dolin, MD;  [facility];  Service: Endoscopy;  Laterality: N/A;   PARTIAL HYSTERECTOMY     TOTAL KNEE ARTHROPLASTY  07/01/2012   Procedure: TOTAL KNEE ARTHROPLASTY;  Surgeon: Carole Civil, MD;  [facility]: [facility];  Service: Orthopedics;  Laterality: Right;   TOTAL KNEE REVISION Right 01/03/2013   Procedure: Patellaplasty Right Knee;  Surgeon: Carole Civil, MD;  Location: [facility];  Service:  Orthopedics;  Laterality: Right;    Family Psychiatric History: See below  Family History:  Family History  Problem Relation Age of Onset   Alcohol abuse Father    Anxiety disorder Father    Alcohol abuse Mother    Anxiety disorder Mother    Alcohol abuse Brother    Anxiety disorder Brother    Drug abuse Brother    Drug abuse Brother    Alcohol abuse Brother    Anxiety disorder Brother    Seizures Brother    Alcohol abuse Brother    Anxiety disorder Brother    Seizures Brother    Alcohol abuse Brother    Anxiety disorder Brother    Alcohol abuse Brother    Anxiety disorder Brother    Breast cancer Sister    Bipolar disorder Sister    Breast cancer Sister    Dementia Paternal Aunt    ADD / ADHD Grandchild    ADD / ADHD Grandchild    Heart disease Other    Diabetes Other    Alcohol abuse Other    Thyroid disease Son    Thyroid disease Daughter    Depression Neg Hx     OCD Neg Hx    Paranoid behavior Neg Hx    Schizophrenia Neg Hx    Sexual abuse Neg Hx    Physical abuse Neg Hx    Colon cancer Neg Hx     Social History:  Social History   Socioeconomic History   Marital status: Legally Separated    Spouse name: Not on file   Number of children: Not on file   Years of education: 12th grade   Highest education level: Not on file  Occupational History   Occupation: disabled    Employer: UNEMPLOYED  Tobacco Use   Smoking status: Former    Packs/day: 0.25    Years: 5.00    Total pack years: 1.25    Types: Cigarettes    Quit date: 06/26/2003    Years since quitting: 18.9   Smokeless tobacco: Never  Vaping Use   Vaping Use: Never used  Substance and Sexual Activity   Alcohol use: Not Currently    Comment: occasionally; once a month.   Drug use: Not Currently    Comment: has a past history of street drug use   Sexual activity: Yes    Birth control/protection: Surgical    Comment: hyst  Other Topics Concern   Not on file  Social History Narrative   Not on file   Social Determinants of Health   Financial Resource Strain: Low Risk  (01/24/2022)   Overall Financial Resource Strain (CARDIA)    Difficulty of Paying Living Expenses: Not hard at all  Food Insecurity: No Food Insecurity (05/05/2022)   Hunger Vital Sign    Worried About Running Out of Food in the Last Year: Never true    [place] in the Last Year: Never true  Transportation Needs: No Transportation Needs (05/05/2022)   PRAPARE - Hydrologist (Medical): No    Lack of Transportation (Non-Medical): No  Physical Activity: Inactive (01/24/2022)   Exercise Vital Sign    Days of Exercise per Week: 0 days    Minutes of Exercise per Session: 0 min  Stress: No Stress Concern Present (01/24/2022)   [place]    Feeling of Stress : Only a little  Social Connections: Socially Isolated  (01/24/2022)  Social Licensed conveyancer [NHANES]    Frequency of Communication with Friends and Family: Once a week    Frequency of Social Gatherings with Friends and Family: Once a week    Attends Religious Services: 1 to 4 times per year    Active Member of [employer] or Organizations: No    Attends Archivist Meetings: Never    Marital Status: Separated    Allergies:  Allergies  Allergen Reactions   Neomycin-Bacitracin Zn-Polymyx Other (See Comments)    Reaction: skin starts to become raw and peels off   Penicillins Hives   Sulfonamide Derivatives Hives and Swelling    Metabolic Disorder Labs: Lab Results  Component Value Date   HGBA1C 5.5 02/19/2020   MPG 111.15 02/19/2020   MPG 120 (H) 01/25/2012   No results found for: "PROLACTIN" Lab Results  Component Value Date   CHOL  12/10/2010    69        ATP III CLASSIFICATION:  <200     mg/dL   Desirable  200-239  mg/dL   Borderline High  >=240    mg/dL   High          TRIG 38 12/10/2010   HDL 28 (L) 12/10/2010   CHOLHDL 2.5 12/10/2010   VLDL 8 12/10/2010   LDLCALC  12/10/2010    33        Total Cholesterol/HDL:CHD Risk Coronary Heart Disease Risk Table                     Men   Women  1/2 Average Risk   3.4   3.3  Average Risk       5.0   4.4  2 X Average Risk   9.6   7.1  3 X Average Risk  23.4   11.0        Use the calculated Patient Ratio above and the CHD Risk Table to determine the patient's CHD Risk.        ATP III CLASSIFICATION (LDL):  <100     mg/dL   Optimal  100-129  mg/dL   Near or Above                    Optimal  130-159  mg/dL   Borderline  160-189  mg/dL   High  >190     mg/dL   Very High   LDLCALC 71 07/12/2009   Lab Results  Component Value Date   TSH 7.105 (H) 02/18/2020   TSH 0.52 02/11/2018    Therapeutic Level Labs: No results found for: "LITHIUM" Lab Results  Component Value Date   VALPROATE 70.7 09/24/2018   VALPROATE 71.5 01/30/2017   No results found  for: "CBMZ"  Current Medications: Current Outpatient Medications  Medication Sig Dispense Refill   ALPRAZolam (XANAX) 0.5 MG tablet Take 1 tablet (0.5 mg total) by mouth 3 (three) times daily. 90 tablet 2   atorvastatin (LIPITOR) 20 MG tablet Take 20 mg by mouth daily.     cyclobenzaprine (FLEXERIL) 10 MG tablet TAKE ONE TABLET BY MOUTH THREE TIMES DAILY AS NEEDED FOR MUSCLE SPASMS (Patient taking differently: Take 10 mg by mouth 3 (three) times daily.) 90 tablet 5   divalproex (DEPAKOTE) 125 MG DR tablet TAKE THREE (3) TABLETS BY MOUTH AT BEDTIME 90 tablet 2   DULoxetine (CYMBALTA) 60 MG capsule Take 1 capsule (60 mg total) by mouth daily. 30 capsule 2   ferrous sulfate 325 (  65 FE) MG tablet Take 325 mg by mouth daily with breakfast.     hydrocortisone (ANUSOL-HC) 2.5 % rectal cream Place 1 application rectally 2 (two) times daily. (Patient taking differently: Place 1 application  rectally as needed for hemorrhoids.) 30 g 1   ibuprofen (ADVIL) 800 MG tablet Take 1 tablet (800 mg total) by mouth every 8 (eight) hours as needed. 90 tablet 1   lactulose (CHRONULAC) 10 GM/15ML solution Take 30 mLs (20 g total) by mouth 2 (two) times daily. 1800 mL 5   levothyroxine (SYNTHROID) 100 MCG tablet Take 100 mcg by mouth daily before breakfast.     lisinopril (PRINIVIL,ZESTRIL) 10 MG tablet Take 10 mg by mouth at bedtime.      lubiprostone (AMITIZA) 24 MCG capsule TAKE ONE CAPSULE BY MOUTH TWICE DAILY WITH A MEAL 60 capsule 11   meloxicam (MOBIC) 7.5 MG tablet Take 1 tablet (7.5 mg total) by mouth daily. 30 tablet 5   Multiple Vitamins-Minerals (MULTIVITAMIN WITH MINERALS) tablet Take 1 tablet by mouth daily.     Naldemedine Tosylate (SYMPROIC) 0.2 MG TABS Take 0.2 mg by mouth daily.     naproxen sodium (ALEVE) 220 MG tablet Take 440 mg by mouth daily as needed (pain).     omeprazole (PRILOSEC) 20 MG capsule TAKE ONE CAPSULE BY MOUTH TWICE DAILY BEFORE A MEAL. 180 capsule 1   oxybutynin (DITROPAN-XL) 10  MG 24 hr tablet Take 10 mg by mouth at bedtime.     oxycodone (OXY-IR) 5 MG capsule Take 1 capsule (5 mg total) by mouth every 6 (six) hours as needed. 30 capsule 0   oxyCODONE-acetaminophen (PERCOCET) 7.5-325 MG tablet Take 1 tablet by mouth every 8 (eight) hours as needed for severe pain. 20 tablet 0   oxyCODONE-acetaminophen (PERCOCET/ROXICET) 5-325 MG tablet Take 1 tablet by mouth every 4 (four) hours as needed. 30 tablet 0   traZODone (DESYREL) 50 MG tablet Take 1 tablet (50 mg total) by mouth at bedtime. 30 tablet 2   No current facility-administered medications for this visit.     Musculoskeletal: Strength & Muscle Tone: na Gait & Station: na Patient leans: N/A  Psychiatric Specialty Exam: Review of Systems  Musculoskeletal:  Positive for arthralgias, gait problem and joint swelling.  All other systems reviewed and are negative.   There were no vitals taken for this visit.There is no height or weight on file to calculate BMI.  General Appearance: NA  Eye Contact:  NA  Speech:  Clear and Coherent  Volume:  Normal  Mood:  Euthymic  Affect:  NA  Thought Process:  Goal Directed  Orientation:  Full (Time, Place, and Person)  Thought Content: Rumination   Suicidal Thoughts:  No  Homicidal Thoughts:  No  Memory:  Immediate;   Good Recent;   Good Remote;   Fair  Judgement:  Good  Insight:  Fair  Psychomotor Activity:  Decreased  Concentration:  Concentration: Good and Attention Span: Good  Recall:  Good  Fund of Knowledge: Good  Language: Good  Akathisia:  No  Handed:  Right  AIMS (if indicated): not done  Assets:  Communication Skills Desire for Improvement Resilience Social Support  ADL's:  Intact  Cognition: WNL  Sleep:  Fair   Screenings: GAD-7    Flowsheet Row Office Visit from 01/24/2022 in [facility]  Total GAD-7 Score 16      PHQ2-9    Flowsheet Row Video Visit from 06/01/2022 in [place]  Video Visit from 02/28/2022 in [place] Office Visit from 01/24/2022 in [facility] Video Visit from 11/28/2021 in [place] Video Visit from 10/07/2021 in [facility]  PHQ-2 Total Score 1 1 0 2 4  PHQ-9 Total Score -- -- 8 7 13       Flowsheet Row Video Visit from 06/01/2022 in [place] Admission (Discharged) from 03/31/2022 in [place] Video Visit from 02/28/2022 in [place] No Risk No Risk No Risk        Assessment and Plan: This patient is a 71 year old female with a history depression anxiety and chronic pain and a remote history of alcohol drug abuse.  She is stable on her current regimen so she will continue Cymbalta 60 mg daily for depression, Depakote 125 mg 3 times daily for mood stabilization, Xanax 0.5 mg 3 times daily from anxiety and trazodone 50 mg at bedtime for sleep.  She will return to see me in 3 months  Collaboration of Care: Collaboration of Care: Primary Care Provider AEB notes will be shared with PCP at patient's request  Patient/Guardian was advised Release of Information must be obtained prior to any record release in order to collaborate their care with an outside provider. Patient/Guardian was advised if they have not already done so to contact the registration department to sign all necessary forms in order for [place] to release information regarding their care.   Consent: Patient/Guardian gives verbal consent for treatment and assignment of benefits for services provided during this visit. Patient/Guardian expressed understanding and agreed to proceed.    Levonne Spiller, MD 06/01/2022, 12:04 PM

## 2022-06-01 NOTE — Progress Notes (Signed)
Office Visit Note   Patient: Diane [name]           Date of Birth: [birth year]           MRN: [id number] Visit Date: 06/01/2022              Requested by: Elfredia Nevins, MD [address] [place],  [place] [address] PCP: Elfredia Nevins, MD  Chief Complaint  Patient presents with   Right Foot - Routine Post Op    03/31/22 right foot tarsal-MT fusion and right achilles lengthening      HPI: Patient is a 71 year old woman who presents 4 weeks status post fusion across the Lisfranc complex.  Patient has not started therapy yet.  Patient states that the oxycodone does not work but the previous Percocet did work.  Assessment & Plan: Visit Diagnoses:  1. Sprain of tarsometatarsal ligament of right foot, subsequent encounter     Plan: Prescription is called in for Percocet she was given instructions and demonstrated passive and active range of motion of the toes, passive and active range of motion of the ankle as well as elevation.  Continue with the postoperative shoe.    Three-view radiographs of the right foot at follow-up.  Follow-Up Instructions: Return in about 4 weeks (around 06/29/2022).   Ortho Exam  Patient is alert, oriented, no adenopathy, well-dressed, normal affect, normal respiratory effort. Examination the incision is well-healed there is no redness or cellulitis.  Patient has decreased range of motion of her toes and these were passively moved without problems.  Patient is ambulating with a cane and postoperative shoe.  Imaging: XR Foot Complete Right  Result Date: 06/01/2022 Three-view radiographs of the right foot shows stable internal fixation and fusion across the Lisfranc complex.  No loosening around the hardware.  No images are attached to the encounter.  Labs: Lab Results  Component Value Date   HGBA1C 5.5 02/19/2020   HGBA1C 5.8 (H) 01/25/2012   HGBA1C 5.7 07/13/2009   ESRSEDRATE 2 03/26/2018   ESRSEDRATE 4 03/16/2014   ESRSEDRATE 40 (H)  12/30/2012   CRP 0.8 03/26/2018   CRP <0.5 03/16/2014   CRP 1.6 (H) 12/30/2012   REPTSTATUS 10/31/2018 FINAL 10/28/2018   GRAMSTAIN  01/03/2013    RARE WBC PRESENT, PREDOMINANTLY MONONUCLEAR NO SQUAMOUS EPITHELIAL CELLS SEEN NO ORGANISMS SEEN   GRAMSTAIN  01/03/2013    RARE WBC PRESENT, PREDOMINANTLY PMN NO SQUAMOUS EPITHELIAL CELLS SEEN NO ORGANISMS SEEN   GRAMSTAIN  01/03/2013    RARE WBC PRESENT,BOTH PMN AND MONONUCLEAR NO SQUAMOUS EPITHELIAL CELLS SEEN NO ORGANISMS SEEN   GRAMSTAIN  01/03/2013    RARE WBC PRESENT, PREDOMINANTLY PMN NO SQUAMOUS EPITHELIAL CELLS SEEN NO ORGANISMS SEEN   GRAMSTAIN  01/03/2013    RARE WBC PRESENT,BOTH PMN AND MONONUCLEAR NO SQUAMOUS EPITHELIAL CELLS SEEN NO ORGANISMS SEEN   GRAMSTAIN  01/03/2013    RARE WBC PRESENT, PREDOMINANTLY MONONUCLEAR NO SQUAMOUS EPITHELIAL CELLS SEEN NO ORGANISMS SEEN   CULT >=100,000 COLONIES/mL ESCHERICHIA COLI (A) 10/28/2018   LABORGA ESCHERICHIA COLI (A) 10/28/2018     Lab Results  Component Value Date   ALBUMIN 3.5 02/18/2020   ALBUMIN 3.4 (L) 11/10/2019   ALBUMIN 4.1 10/28/2018    Lab Results  Component Value Date   MG 1.7 02/18/2020   MG 2.2 10/28/2018   No results found for: "VD25OH"  No results found for: "PREALBUMIN"    Latest Ref Rng & Units 03/31/2022    9:37 AM 09/06/2021  3:19 PM 06/30/2020    2:32 PM  CBC EXTENDED  WBC 4.0 - 10.5 K/uL 5.2  6.0  8.6   RBC 3.87 - 5.11 MIL/uL 4.17  4.21  4.12   Hemoglobin 12.0 - 15.0 g/dL 97.0  26.3  78.5   HCT 36.0 - 46.0 % 39.7  37.6  36.2   Platelets 150 - 400 K/uL 158  182  235   NEUT# 1,500 - 7,800 cells/uL   4,180   Lymph# 850 - 3,900 cells/uL   3,861      There is no height or weight on file to calculate BMI.  Orders:  Orders Placed This Encounter  Procedures   XR Foot Complete Right   Ambulatory referral to Physical Therapy   Meds ordered this encounter  Medications   oxyCODONE-acetaminophen (PERCOCET/ROXICET) 5-325 MG tablet     Sig: Take 1 tablet by mouth every 4 (four) hours as needed.    Dispense:  30 tablet    Refill:  0     Procedures: No procedures performed  Clinical Data: No additional findings.  ROS:  All other systems negative, except as noted in the HPI. Review of Systems  Objective: Vital Signs: There were no vitals taken for this visit.  Specialty Comments:  No specialty comments available.  PMFS History: Patient Active Problem List   Diagnosis Date Noted   Primary osteoarthritis of right foot    Syncope 02/18/2020   Prolonged QT interval 02/18/2020   Glucose intolerance 02/18/2020   Normocytic anemia 12/10/2019   Poor appetite 09/24/2019   Abnormal weight loss 09/24/2019   Upper abdominal mass 09/24/2019   Dysphagia 01/30/2019   Simple adnexal cyst greater than 1 cm in diameter in postmenopausal patient 11/08/2018   Abdominal pain 08/09/2015   Dysphagia, pharyngoesophageal phase    H/O adenomatous polyp of colon 05/05/2015   Constipation 02/02/2015   Knee contusion 12/09/2013   Esophageal dysphagia 07/02/2013   Abdominal pain, epigastric 07/02/2013   Chronic pain syndrome 11/28/2012   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   Cellulitis 11/04/2012   Postoperative wound breakdown 11/04/2012   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   Knee pain 08/13/2012   Hypokalemia 08/01/2012   Cellulitis, wound, post-operative 07/30/2012   Degenerative tear of medial meniscus of right knee 01/12/2012   OA (osteoarthritis) of knee 01/12/2012   Right knee sprain 11/02/2011   Instability of knee joint 03/29/2011   URTICARIA 12/21/2009   OTHER URINARY INCONTINENCE 07/18/2009   FOOT PAIN, LEFT 05/26/2009   DERMATITIS, CHRONIC 10/21/2008   IMPAIRED GLUCOSE TOLERANCE 09/09/2008   Chronic back pain 09/09/2008   Hypothyroidism 02/27/2008   Hyperlipidemia 02/27/2008   Anxiety with depression 02/27/2008   Bipolar I disorder, most recent episode (or current) depressed, unspecified  02/27/2008   HYPERTENSION 02/27/2008   ALLERGIC RHINITIS 02/27/2008   GERD 02/27/2008   HEADACHE 02/27/2008   Past Medical History:  Diagnosis Date   Allergic rhinitis    Anxiety    Arthritis    Cataracts, bilateral    Chronic back pain    Sees pain mamagement clinic   Chronic pain syndrome    Constipation    Depression    Essential hypertension    GERD (gastroesophageal reflux disease)    Headaches, cluster    History of seizures    Hypothyroid    Mania (HCC)    Mixed hyperlipidemia    PTSD (post-traumatic stress disorder)    Right lumbar radiculopathy    Sleep apnea  STOP BANG score=4   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(HCC)     Family History  Problem Relation Age of Onset   Alcohol abuse Father    Anxiety disorder Father    Alcohol abuse Mother    Anxiety disorder Mother    Alcohol abuse Brother    Anxiety disorder Brother    Drug abuse Brother    Drug abuse Brother    Alcohol abuse Brother    Anxiety disorder Brother    Seizures Brother    Alcohol abuse Brother    Anxiety disorder Brother    Seizures Brother    Alcohol abuse Brother    Anxiety disorder Brother    Alcohol abuse Brother    Anxiety disorder Brother    Breast cancer Sister    Bipolar disorder Sister    Breast cancer Sister    Dementia Paternal Aunt    ADD / ADHD Grandchild    ADD / ADHD Grandchild    Heart disease Other    Diabetes Other    Alcohol abuse Other    Thyroid disease Son    Thyroid disease Daughter    Depression Neg Hx    OCD Neg Hx    Paranoid behavior Neg Hx    Schizophrenia Neg Hx    Sexual abuse Neg Hx    Physical abuse Neg Hx    Colon cancer Neg Hx     Past Surgical History:  Procedure Laterality Date   BACK SURGERY     BILATERAL OOPHORECTOMY     CATARACT EXTRACTION W/PHACO Left 01/04/2016   Procedure: CATARACT EXTRACTION PHACO AND INTRAOCULAR LENS PLACEMENT (IOC);  Surgeon: Jethro BolusMark Shapiro, MD;  Location: [facility];  Service: Ophthalmology;  Laterality: Left;   CDE:4.70   CATARACT EXTRACTION W/PHACO Right 01/18/2016   Procedure: CATARACT EXTRACTION PHACO AND INTRAOCULAR LENS PLACEMENT RIGHT EYE; CDE:  4.66;  Surgeon: Jethro BolusMark Shapiro, MD;  Location: [facility];  Service: Ophthalmology;  Laterality: Right;   CHOLECYSTECTOMY     COLONOSCOPY  10/16/2008   WUJ:[id number]:Hemorrhoids, otherwise normal rectum; pancolonic diverticula the remainder of the colonic mucosa appeared normal. Next TCS due 10/2013.   COLONOSCOPY  06/28/2006   RMR: Internal hemorrhoids. Diminutive rectal polyps, cold biopsied/ Pancolonic diverticula. Polyps in the right colon removed    COLONOSCOPY WITH PROPOFOL N/A 05/20/2015   RMR: Pancolonic diverticulosis. Redundant colon    COLONOSCOPY WITH PROPOFOL N/A 11/11/2020   Procedure: COLONOSCOPY WITH PROPOFOL;  Surgeon: Corbin Adeourk, Robert M, MD;  Location: [facility];  Service: Endoscopy;  Laterality: N/A;  pm ASA 2   ESOPHAGOGASTRODUODENOSCOPY  10/16/2008   AOZ:[id number]:Small hiatal hernia otherwise normal esophagus, stomach  D1, D2, status post passage of a 56-French Maloney dilator   ESOPHAGOGASTRODUODENOSCOPY  06/28/2006   HQI:[id number]:Normal esophagus. Small hiatal hernia. Otherwise normal stomach, D1 and D2, status post passage of a 56 French Maloney dilator   ESOPHAGOGASTRODUODENOSCOPY (EGD) WITH ESOPHAGEAL DILATION N/A 07/14/2013   RMR: Abnormal esophagus of uncertain significance-status post esophageal biospy. small hiatal hernia.    ESOPHAGOGASTRODUODENOSCOPY (EGD) WITH PROPOFOL N/A 05/20/2015   RMR: NORMAL EGD status post maloney dilation    ESOPHAGOGASTRODUODENOSCOPY (EGD) WITH PROPOFOL N/A 12/23/2018   Dr. Jena Gaussrourk: normal esophagus, s/p esophageal dilation.    FOOT ARTHRODESIS Right 03/31/2022   Procedure: RIGHT FOOT TARSAL-METATARSAL FUSION;  Surgeon: Nadara Mustarduda, Ramani Riva V, MD;  [facility];  Service: Orthopedics;  Laterality: Right;   FOOT SURGERY     left foot-   GASTROCNEMIUS RECESSION Right 03/31/2022   Procedure: RIGHT  ACHILLES LENGTHENING;  Surgeon: Newt Minion,  MD;  Location: [facility];  Service: Orthopedics;  Laterality: Right;   INCISION AND DRAINAGE Right 01/03/2013   Procedure: INCISION AND DRAINAGE;  Surgeon: Carole Civil, MD;  Location: [facility];  Service: Orthopedics;  Laterality: Right;   KNEE ARTHROSCOPY  01/2012   right   KNEE ARTHROSCOPY WITH LATERAL RELEASE Right 01/03/2013   Procedure: Lateral Release Patella Right Knee;  Surgeon: Carole Civil, MD;  Location: [facility];  Service: Orthopedics;  Laterality: Right;   LAPAROSCOPIC LYSIS OF ADHESIONS N/A 11/12/2019   Procedure: LAPAROSCOPIC LYSIS OF ADHESIONS, extensive; drainage of peritoneal cyst;  Surgeon: Florian Buff, MD;  Location: [facility];  Service: Gynecology;  Laterality: N/A;   LUMBAR DISC SURGERY  L4-5   MALONEY DILATION N/A 05/20/2015   Procedure: Venia Minks DILATION;  Surgeon: Daneil Dolin, MD;  Location: [facility];  Service: Endoscopy;  Laterality: N/AVenia Minks dilator # 4   MALONEY DILATION N/A 12/23/2018   Procedure: Venia Minks DILATION;  Surgeon: Daneil Dolin, MD;  [facility];  Service: Endoscopy;  Laterality: N/A;   PARTIAL HYSTERECTOMY     TOTAL KNEE ARTHROPLASTY  07/01/2012   Procedure: TOTAL KNEE ARTHROPLASTY;  Surgeon: Carole Civil, MD;  Location: [facility];  Service: Orthopedics;  Laterality: Right;   TOTAL KNEE REVISION Right 01/03/2013   Procedure: Patellaplasty Right Knee;  Surgeon: Carole Civil, MD;  Location: [facility];  Service: Orthopedics;  Laterality: Right;   Social History   Occupational History   Occupation: disabled    Fish farm manager: UNEMPLOYED  Tobacco Use   Smoking status: Former    Packs/day: 0.25    Years: 5.00    Total pack years: 1.25    Types: Cigarettes    Quit date: 06/26/2003    Years since quitting: 18.9   Smokeless tobacco: Never  Vaping Use   Vaping Use: Never used  Substance and Sexual Activity   Alcohol use: Not Currently    Comment: occasionally; once a month.   Drug use: Not Currently    Comment: has a past history  of street drug use   Sexual activity: Yes    Birth control/protection: Surgical    Comment: hyst

## 2022-06-02 ENCOUNTER — Encounter (HOSPITAL_COMMUNITY): Payer: Self-pay | Admitting: Physical Therapy

## 2022-06-02 ENCOUNTER — Ambulatory Visit (HOSPITAL_COMMUNITY): Payer: Medicare Other | Admitting: Physical Therapy

## 2022-06-06 ENCOUNTER — Ambulatory Visit: Payer: Self-pay | Admitting: *Deleted

## 2022-06-06 NOTE — Patient Outreach (Signed)
  Care Coordination   06/06/2022 Name: LYNLEY KILLILEA MRN: [id number] DOB: 04-04-1951   Care Coordination Outreach Attempts:  An unsuccessful telephone outreach was attempted for a scheduled appointment today.  Follow Up Plan:  Additional outreach attempts will be made to offer the patient care coordination information and services.   Encounter Outcome:  No Answer  Care Coordination Interventions Activated:  No   Care Coordination Interventions:  No, not indicated    Valente David, RN, MSN, [facility] Care Management Care Management Coordinator [phone]

## 2022-06-07 ENCOUNTER — Other Ambulatory Visit: Payer: Self-pay | Admitting: Orthopedic Surgery

## 2022-06-07 NOTE — Progress Notes (Signed)
No show

## 2022-06-09 ENCOUNTER — Other Ambulatory Visit: Payer: Self-pay | Admitting: Orthopedic Surgery

## 2022-06-13 ENCOUNTER — Ambulatory Visit (HOSPITAL_COMMUNITY): Payer: Medicare Other | Admitting: Physical Therapy

## 2022-06-15 NOTE — Progress Notes (Signed)
Patient unable to be seen on day of visit due to technology issues.  Patient was rescheduled for an appointment next week.  [name], MSN, APRN, FNP-BC, AGACNP-BC [facility]

## 2022-06-16 ENCOUNTER — Telehealth: Payer: Self-pay | Admitting: *Deleted

## 2022-06-16 ENCOUNTER — Telehealth: Payer: Self-pay | Admitting: Gastroenterology

## 2022-06-16 ENCOUNTER — Other Ambulatory Visit: Payer: Self-pay | Admitting: Orthopedic Surgery

## 2022-06-16 ENCOUNTER — Encounter: Payer: Self-pay | Admitting: Gastroenterology

## 2022-06-16 VITALS — Ht 63.0 in | Wt 210.0 lb

## 2022-06-16 NOTE — Telephone Encounter (Signed)
Diane [name], you are scheduled for a virtual visit with your provider today.  Just as we do with appointments in the office, we must obtain your consent to participate.  Your consent will be active for this visit and any virtual visit you may have with one of our providers in the next 365 days.  If you have a MyChart account, I can also send a copy of this consent to you electronically.  All virtual visits are billed to your insurance company just like a traditional visit in the office.  As this is a virtual visit, video technology does not allow for your provider to perform a traditional examination.  This may limit your provider's ability to fully assess your condition.  If your provider identifies any concerns that need to be evaluated in person or the need to arrange testing such as labs, EKG, etc, we will make arrangements to do so.  Although advances in technology are sophisticated, we cannot ensure that it will always work on either your end or our end.  If the connection with a video visit is poor, we may have to switch to a telephone visit.  With either a video or telephone visit, we are not always able to ensure that we have a secure connection.   I need to obtain your verbal consent now.   Are you willing to proceed with your visit today? Patient consent to virtual video visit.

## 2022-06-18 ENCOUNTER — Encounter: Payer: Self-pay | Admitting: Gastroenterology

## 2022-06-19 NOTE — Progress Notes (Unsigned)
Primary Care Physician:  Redmond School, MD  Primary Gastroenterologist: Cristopher Estimable. Rourk, MD  Patient Location: Home Reason for Visit: Follow-up, irregular bowel movements  Persons present on the virtual encounter (telephone), with roles: Patient - Diane [name]; Provider - [name], NP   Total time (minutes) spent on medical discussion: 21 minutes  Virtual Visit/Telephone Encounter Note Visit is conducted virtually and was requested by patient.   I connected with Diane [name] on 06/21/22 at  9:00 AM EDT by telephone and verified that I am speaking with the correct person using two identifiers.   I discussed the limitations, risks, security and privacy concerns of performing an evaluation and management service by telephone and the availability of in person appointments. I also discussed with the patient that there may be a patient responsible charge related to this service. The patient expressed understanding and agreed to proceed.  Chief Complaint  Patient presents with   Follow-up    Not having regular BM's      History of Present Illness: Diane [name] is a 71 y.o. female with a history of GERD, constipation, dysphagia, colonic adenomas, normocytic anemia presented today for follow-up.  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  CT A/P with contrast in December 2022 with no acute abnormality.  Stable adnexal cyst and was recommended follow-up ultrasound in 6/12 months.  Last visit 12/30/2021.  She noted that her constipation has been historically difficult to manage.  Was previously on Symproic and Amitiza was unclear what she was currently taking at that time.  Upon calling pharmacy it appears that she had last filled Symproic in December 2022 and  failed Amitiza the week prior to her appointment but had not filled that since November 2022.  Stated that she was taking Symproic on days that she was taking pain meds but has been taking Amitiza twice daily.  Also on MiraLAX and continues to have irregular bowel movements.  Also with left lower quadrant abdominal pain.  Noted some dysphagia with pills on 1 occurrence, and having breakthrough heartburn symptoms exacerbated by on shoes.  She was on pantoprazole twice daily.  BPE ordered but not completed.  She was switched from pantoprazole to omeprazole 20 mg twice daily.  Advised to continue Linzess daily and Symproic on days she takes opioids and add lactulose 30 mL twice daily for constipation.  Advised to follow-up with GYN regarding CT findings.  Today: Constipation - Reports she is taking Miralax, lactulose (30 ml) twice daily. Reports she is not getting her refills like she should be. Reports she may have 1 good bowel movement maybe twice a day. Is improved from where she used to be. Bowel movements are painful and has a lot of cramps until she goes. Does not have any Amitiza right now at home and has not been taking that. Still taking Symproic when she takes pain medication. Stools start out as hard and then it is soft. Report  her stomach will swell and will have bloating when she needs to have a bowel movement. Using suppository from time to time right now to help have bowel movements.   Per review of dispensing record it appears that she has not filled Amitiza since 01/26/22, Symproic since 12/27/21. Has not filled lactulose since 01/26/22.   Dysphagia/GERD -  controlled with omeprazole 20 mg twice daily. Still has some intermittent issues with food getting stuck, even sometimes with liquid, this has bene going on twice per week for a few months. Has bene taking smaller bites and chewing longer. Denies any weight loss    Medications Current Meds  Medication Sig   ALPRAZolam (XANAX) 0.5 MG  tablet Take 1 tablet (0.5 mg total) by mouth 3 (three) times daily.   atorvastatin (LIPITOR) 20 MG tablet Take 20 mg by mouth daily.   cyclobenzaprine (FLEXERIL) 10 MG tablet TAKE ONE TABLET BY MOUTH THREE TIMES DAILY AS NEEDED FOR MUSCLE SPASMS (Patient taking differently: Take 10 mg by mouth 3 (three) times daily.)   divalproex (DEPAKOTE) 125 MG DR tablet TAKE THREE (3) TABLETS BY MOUTH AT BEDTIME   DULoxetine (CYMBALTA) 60 MG capsule Take 1 capsule (60 mg total) by mouth daily.   hydrocortisone (ANUSOL-HC) 2.5 % rectal cream Place 1 application rectally 2 (two) times daily. (Patient taking differently: Place 1 application  rectally as needed for hemorrhoids.)   lactulose (CHRONULAC) 10 GM/15ML solution Take 30 mLs (20 g total) by mouth 2 (two) times daily.   levothyroxine (SYNTHROID) 100 MCG tablet Take 100 mcg by mouth daily before breakfast.   lisinopril (PRINIVIL,ZESTRIL) 10 MG tablet Take 10 mg by mouth at bedtime.    lubiprostone (AMITIZA) 24 MCG capsule TAKE ONE CAPSULE BY MOUTH TWICE DAILY WITH A MEAL   meloxicam (MOBIC) 7.5 MG tablet Take 1 tablet (7.5 mg total) by mouth daily.   Naldemedine Tosylate (SYMPROIC) 0.2 MG TABS Take 0.2 mg by mouth daily.   omeprazole (PRILOSEC) 20 MG capsule TAKE ONE CAPSULE BY MOUTH TWICE DAILY BEFORE A MEAL.   oxybutynin (DITROPAN-XL) 10 MG 24 hr tablet Take 10 mg by mouth at bedtime.   oxyCODONE-acetaminophen (PERCOCET/ROXICET) 5-325 MG tablet Take 1 tablet by mouth every 6 (six) hours as needed.   traZODone (DESYREL) 50 MG tablet Take 1 tablet (50 mg total) by mouth at bedtime.     History Past Medical History:  Diagnosis Date   Allergic rhinitis    Anxiety    Arthritis    Cataracts, bilateral    Chronic back pain    Sees pain mamagement clinic   Chronic pain syndrome    Constipation    Depression    Essential hypertension    GERD (gastroesophageal reflux disease)    Headaches, cluster    History of seizures    Hypothyroid    Mania (HCC)     Mixed hyperlipidemia    PTSD (post-traumatic stress disorder)    Right lumbar radiculopathy    Sleep apnea    STOP BANG score=4   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[facility])     Past Surgical History:  Procedure Laterality Date   BACK SURGERY     BILATERAL OOPHORECTOMY     CATARACT EXTRACTION W/PHACO Left 01/04/2016   Procedure: CATARACT EXTRACTION PHACO AND INTRAOCULAR LENS PLACEMENT ([place]);  Surgeon: [place] Guys, MD;  Location: [facility];  Service: Ophthalmology;  Laterality: Left;  CDE:4.70   CATARACT EXTRACTION W/PHACO Right 01/18/2016   Procedure: CATARACT EXTRACTION PHACO AND INTRAOCULAR LENS PLACEMENT RIGHT EYE; CDE:  4.66;  Surgeon: [place] Guys, MD;  Location: [facility];  Service: Ophthalmology;  Laterality: Right;   CHOLECYSTECTOMY     COLONOSCOPY  10/16/2008   NAT:[id number], otherwise normal rectum; pancolonic diverticula the remainder of the colonic mucosa appeared normal. Next TCS due 10/2013.   COLONOSCOPY  06/28/2006   RMR: Internal hemorrhoids.  Diminutive rectal polyps, cold biopsied/ Pancolonic diverticula. Polyps in the right colon removed    COLONOSCOPY WITH PROPOFOL N/A 05/20/2015   RMR: Pancolonic diverticulosis. Redundant colon    COLONOSCOPY WITH PROPOFOL N/A 11/11/2020   Procedure: COLONOSCOPY WITH PROPOFOL;  Surgeon: Corbin Ade, MD;  Location: [facility];  Service: Endoscopy;  Laterality: N/A;  pm ASA 2   ESOPHAGOGASTRODUODENOSCOPY  10/16/2008   JOI:NOMVE hiatal hernia otherwise normal esophagus, stomach  D1, D2, status post passage of a 56-French Maloney dilator   ESOPHAGOGASTRODUODENOSCOPY  06/28/2006   HMC:NOBSJG esophagus. Small hiatal hernia. Otherwise normal stomach, D1 and D2, status post passage of a 56 French Maloney dilator   ESOPHAGOGASTRODUODENOSCOPY (EGD) WITH ESOPHAGEAL DILATION N/A 07/14/2013   RMR: Abnormal esophagus of uncertain significance-status post esophageal biospy. small hiatal hernia.    ESOPHAGOGASTRODUODENOSCOPY (EGD) WITH PROPOFOL N/A 05/20/2015    RMR: NORMAL EGD status post maloney dilation    ESOPHAGOGASTRODUODENOSCOPY (EGD) WITH PROPOFOL N/A 12/23/2018   Dr. Jena Gauss: normal esophagus, s/p esophageal dilation.    FOOT ARTHRODESIS Right 03/31/2022   Procedure: RIGHT FOOT TARSAL-METATARSAL FUSION;  Surgeon: Nadara Mustard, MD;  Location[facility];  Service: Orthopedics;  Laterality: Right;   FOOT SURGERY     left foot-   GASTROCNEMIUS RECESSION Right 03/31/2022   Procedure: RIGHT ACHILLES LENGTHENING;  Surgeon: Nadara Mustard, MD;  Location: [facility] OR;  Service: Orthopedics;  Laterality: Right;   INCISION AND DRAINAGE Right 01/03/2013   Procedure: INCISION AND DRAINAGE;  Surgeon: Vickki Hearing, MD;  Location: [facility];  Service: Orthopedics;  Laterality: Right;   KNEE ARTHROSCOPY  01/2012   right   KNEE ARTHROSCOPY WITH LATERAL RELEASE Right 01/03/2013   Procedure: Lateral Release Patella Right Knee;  Surgeon: Vickki Hearing, MD;  Location: [facility];  Service: Orthopedics;  Laterality: Right;   LAPAROSCOPIC LYSIS OF ADHESIONS N/A 11/12/2019   Procedure: LAPAROSCOPIC LYSIS OF ADHESIONS, extensive; drainage of peritoneal cyst;  Surgeon: Lazaro Arms, MD;  Location: [facility];  Service: Gynecology;  Laterality: N/A;   LUMBAR DISC SURGERY  L4-5   MALONEY DILATION N/A 05/20/2015   Procedure: Elease Hashimoto DILATION;  Surgeon: Corbin Ade, MD;  Location: [facility];  Service: Endoscopy;  Laterality: N/AElease Hashimoto dilator # 54   MALONEY DILATION N/A 12/23/2018   Procedure: Elease Hashimoto DILATION;  Surgeon: Corbin Ade, MD;  [facility];  Service: Endoscopy;  Laterality: N/A;   PARTIAL HYSTERECTOMY     TOTAL KNEE ARTHROPLASTY  07/01/2012   Procedure: TOTAL KNEE ARTHROPLASTY;  Surgeon: Vickki Hearing, MD;  Location: [facility];  Service: Orthopedics;  Laterality: Right;   TOTAL KNEE REVISION Right 01/03/2013   Procedure: Patellaplasty Right Knee;  Surgeon: Vickki Hearing, MD;  Location: [facility];  Service: Orthopedics;  Laterality: Right;    Family  History  Problem Relation Age of Onset   Alcohol abuse Father    Anxiety disorder Father    Alcohol abuse Mother    Anxiety disorder Mother    Alcohol abuse Brother    Anxiety disorder Brother    Drug abuse Brother    Drug abuse Brother    Alcohol abuse Brother    Anxiety disorder Brother    Seizures Brother    Alcohol abuse Brother    Anxiety disorder Brother    Seizures Brother    Alcohol abuse Brother    Anxiety disorder Brother    Alcohol abuse Brother    Anxiety disorder Brother  Breast cancer Sister    Bipolar disorder Sister    Breast cancer Sister    Dementia Paternal 74    ADD / ADHD Grandchild    ADD / ADHD Grandchild    Heart disease Other    Diabetes Other    Alcohol abuse Other    Thyroid disease Son    Thyroid disease Daughter    Depression Neg Hx    OCD Neg Hx    Paranoid behavior Neg Hx    Schizophrenia Neg Hx    Sexual abuse Neg Hx    Physical abuse Neg Hx    Colon cancer Neg Hx     Social History   Socioeconomic History   Marital status: Legally Separated    Spouse name: Not on file   Number of children: Not on file   Years of education: 12th grade   Highest education level: Not on file  Occupational History   Occupation: disabled    Employer: UNEMPLOYED  Tobacco Use   Smoking status: Former    Packs/day: 0.25    Years: 5.00    Total pack years: 1.25    Types: Cigarettes    Quit date: 06/26/2003    Years since quitting: 19.0   Smokeless tobacco: Never  Vaping Use   Vaping Use: Never used  Substance and Sexual Activity   Alcohol use: Not Currently    Comment: occasionally; once a month.   Drug use: Not Currently    Comment: has a past history of street drug use   Sexual activity: Yes    Birth control/protection: Surgical    Comment: hyst  Other Topics Concern   Not on file  Social History Narrative   Not on file   Social Determinants of Health   Financial Resource Strain: Low Risk  (01/24/2022)   Overall Financial  Resource Strain (CARDIA)    Difficulty of Paying Living Expenses: Not hard at all  Food Insecurity: No Food Insecurity (05/05/2022)   Hunger Vital Sign    Worried About Running Out of Food in the Last Year: Never true    [place] in the Last Year: Never true  Transportation Needs: No Transportation Needs (05/05/2022)   PRAPARE - Hydrologist (Medical): No    Lack of Transportation (Non-Medical): No  Physical Activity: Inactive (01/24/2022)   Exercise Vital Sign    Days of Exercise per Week: 0 days    Minutes of Exercise per Session: 0 min  Stress: No Stress Concern Present (01/24/2022)   [place]    Feeling of Stress : Only a little  Social Connections: Socially Isolated (01/24/2022)   Social Connection and Isolation Panel [NHANES]    Frequency of Communication with Friends and Family: Once a week    Frequency of Social Gatherings with Friends and Family: Once a week    Attends Religious Services: 1 to 4 times per year    Active Member of [employer] or Organizations: No    Attends Archivist Meetings: Never    Marital Status: Separated      Review of Systems: Gen: Denies fever, chills, anorexia. Denies fatigue, weakness, weight loss.  CV: Denies chest pain, palpitations, syncope, peripheral edema, and claudication. Resp: Denies dyspnea at rest, cough, wheezing, coughing up blood, and pleurisy. GI: see HPI Derm: Denies rash, itching, dry skin Psych: Denies depression, anxiety, memory loss, confusion. No homicidal or suicidal ideation.  Heme:  Denies bruising, bleeding, and enlarged lymph nodes.  Observations/Objective: Alert and oriented. Pleasant. Unable to perform complete physical exam due to no video encounter.   Assessment:  Constipation: Chronic.  Likely exacerbated by occasional opioid use.  Previously on MiraLAX, stool softeners, and suppositories.  Has had Amitiza  ordered but at her last visit was likely not taking it given time between refills.  She reports she currently is taking MiraLAX and lactulose and Symproic on days that she takes pain medication.  Upon review of her med cabinet patient reported she did not have any Amitiza at home.  She states she initially has a hard stool followed by loose or soft stools, will have intense abdominal cramping prior to bowel movement that is improved afterwards.  She typically is having at least 1 bowel movement daily but given that she still has hard stools that are slightly painful to pass, I advised to either begin taking a stool softener twice daily or restart her Amitiza.  She states that she has been having difficulty getting her refills of her medications when she should.  She would like to trial restarting Amitiza.  We will send a new prescription and advised her to continue her MiraLAX and twice daily lactulose.  With softer stools, she should have some relief of abdominal cramping prior to bowel movements.  GERD: Reflux fairly well controlled on omeprazole 20 mg twice daily.  Antireflux measures reinforced.  Dysphagia: Patient reports ongoing symptoms, usually with foods but sometimes also liquids.  Feels like is getting stuck in her mid chest and will have to regurgitate up the contents.  Occurring about twice per week for the last few months.  BPE ordered at her last visit but was not completed.  I encouraged her to have this performed to determine whether or not EGD is needed.  Chewing precautions discussed, handout will be provided.  Also advised when to follow-up in the ED.   Plan:  BPE, ordered at previous visit, will schedule for reorder if needed. Chewing precautions discussed.  ED precautions discussed.  Amitiza 24 mcg twice daily, with food.  Continue lactulose 42ml twice daily and miralax Follow up in office in 2-3 months.     Follow Up Instructions:  I discussed the assessment and treatment  plan with the patient. The patient was provided an opportunity to ask questions and all were answered. The patient agreed with the plan and demonstrated an understanding of the instructions.   The patient was advised to call back or seek an in-person evaluation if the symptoms worsen or if the condition fails to improve as anticipated.   Spent 21 minutes on discussion with the patient and an additional 3 minutes on post visit activities for the telephone encounter.   [name], MSN, APRN, FNP-BC, AGACNP-BC [facility]

## 2022-06-20 ENCOUNTER — Encounter (HOSPITAL_COMMUNITY): Payer: Self-pay | Admitting: Physical Therapy

## 2022-06-20 ENCOUNTER — Telehealth: Payer: Self-pay | Admitting: Orthopedic Surgery

## 2022-06-20 ENCOUNTER — Ambulatory Visit (HOSPITAL_COMMUNITY): Payer: Medicare Other | Attending: Orthopedic Surgery | Admitting: Physical Therapy

## 2022-06-20 DIAGNOSIS — R262 Difficulty in walking, not elsewhere classified: Secondary | ICD-10-CM | POA: Diagnosis not present

## 2022-06-20 DIAGNOSIS — M25571 Pain in right ankle and joints of right foot: Secondary | ICD-10-CM | POA: Diagnosis not present

## 2022-06-20 DIAGNOSIS — M12571 Traumatic arthropathy, right ankle and foot: Secondary | ICD-10-CM | POA: Insufficient documentation

## 2022-06-20 NOTE — Telephone Encounter (Signed)
Pt was in the office 06/01/2022 s/p 03/31/22 right foot tarsal-MT fusion and right achilles lengthening requesting refill of oxycodone please advise.

## 2022-06-20 NOTE — Therapy (Signed)
OUTPATIENT PHYSICAL THERAPY LOWER EXTREMITY EVALUATION   Patient Name: ALYRIC PARKIN MRN: [id number] DOB:December 24, 1950, 71 y.o., female Today's Date: 06/20/2022   PT End of Session - 06/20/22 1036     Visit Number 1    Number of Visits 16    Date for PT Re-Evaluation 08/15/22    Authorization Type [employer] Medicare    Progress Note Due on Visit 10    PT Start Time 0950    PT Stop Time 1035    PT Time Calculation (min) 45 min    Activity Tolerance Patient tolerated treatment well;Patient limited by pain    Behavior During Therapy WFL for tasks assessed/performed             Past Medical History:  Diagnosis Date   Allergic rhinitis    Anxiety    Arthritis    Cataracts, bilateral    Chronic back pain    Sees pain [facility]   Chronic pain syndrome    Constipation    Depression    Essential hypertension    GERD (gastroesophageal reflux disease)    Headaches, cluster    History of seizures    Hypothyroid    Mania (HCC)    Mixed hyperlipidemia    PTSD (post-traumatic stress disorder)    Right lumbar radiculopathy    Sleep apnea    STOP BANG score=4   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[facility])    Past Surgical History:  Procedure Laterality Date   BACK SURGERY     BILATERAL OOPHORECTOMY     CATARACT EXTRACTION W/PHACO Left 01/04/2016   Procedure: CATARACT EXTRACTION PHACO AND INTRAOCULAR LENS PLACEMENT ([place]);  Surgeon: Rutherford Guys, MD;  Location: [facility];  Service: Ophthalmology;  Laterality: Left;  CDE:4.70   CATARACT EXTRACTION W/PHACO Right 01/18/2016   Procedure: CATARACT EXTRACTION PHACO AND INTRAOCULAR LENS PLACEMENT RIGHT EYE; CDE:  4.66;  Surgeon: Rutherford Guys, MD;  Location: [facility];  Service: Ophthalmology;  Laterality: Right;   CHOLECYSTECTOMY     COLONOSCOPY  10/16/2008   AYT:[id number], otherwise normal rectum; pancolonic diverticula the remainder of the colonic mucosa appeared normal. Next TCS due 10/2013.   COLONOSCOPY  06/28/2006   RMR: Internal  hemorrhoids. Diminutive rectal polyps, cold biopsied/ Pancolonic diverticula. Polyps in the right colon removed    COLONOSCOPY WITH PROPOFOL N/A 05/20/2015   RMR: Pancolonic diverticulosis. Redundant colon    COLONOSCOPY WITH PROPOFOL N/A 11/11/2020   Procedure: COLONOSCOPY WITH PROPOFOL;  Surgeon: Daneil Dolin, MD;  [facility];  Service: Endoscopy;  Laterality: N/A;  pm ASA 2   ESOPHAGOGASTRODUODENOSCOPY  10/16/2008   TDD:UKGUR hiatal hernia otherwise normal esophagus, stomach  D1, D2, status post passage of a 56-French Maloney dilator   ESOPHAGOGASTRODUODENOSCOPY  06/28/2006   KYH:CWCBJS esophagus. Small hiatal hernia. Otherwise normal stomach, D1 and D2, status post passage of a 56 French Maloney dilator   ESOPHAGOGASTRODUODENOSCOPY (EGD) WITH ESOPHAGEAL DILATION N/A 07/14/2013   RMR: Abnormal esophagus of uncertain significance-status post esophageal biospy. small hiatal hernia.    ESOPHAGOGASTRODUODENOSCOPY (EGD) WITH PROPOFOL N/A 05/20/2015   RMR: NORMAL EGD status post maloney dilation    ESOPHAGOGASTRODUODENOSCOPY (EGD) WITH PROPOFOL N/A 12/23/2018   Dr. Gala Romney: normal esophagus, s/p esophageal dilation.    FOOT ARTHRODESIS Right 03/31/2022   Procedure: RIGHT FOOT TARSAL-METATARSAL FUSION;  Surgeon: Newt Minion, MD;  Location: [place];  Service: Orthopedics;  Laterality: Right;   FOOT SURGERY     left foot-   GASTROCNEMIUS RECESSION Right 03/31/2022  Procedure: RIGHT ACHILLES LENGTHENING;  Surgeon: Nadara Mustard, MD;  Location: [facility];  Service: Orthopedics;  Laterality: Right;   INCISION AND DRAINAGE Right 01/03/2013   Procedure: INCISION AND DRAINAGE;  Surgeon: Vickki Hearing, MD;  Location: [facility];  Service: Orthopedics;  Laterality: Right;   KNEE ARTHROSCOPY  01/2012   right   KNEE ARTHROSCOPY WITH LATERAL RELEASE Right 01/03/2013   Procedure: Lateral Release Patella Right Knee;  Surgeon: Vickki Hearing, MD;  Location: [facility];  Service: Orthopedics;  Laterality:  Right;   LAPAROSCOPIC LYSIS OF ADHESIONS N/A 11/12/2019   Procedure: LAPAROSCOPIC LYSIS OF ADHESIONS, extensive; drainage of peritoneal cyst;  Surgeon: Lazaro Arms, MD;  Location: [facility];  Service: Gynecology;  Laterality: N/A;   LUMBAR DISC SURGERY  L4-5   MALONEY DILATION N/A 05/20/2015   Procedure: Elease Hashimoto DILATION;  Surgeon: Corbin Ade, MD;  Location: [facility];  Service: Endoscopy;  Laterality: N/AElease Hashimoto dilator # 54   MALONEY DILATION N/A 12/23/2018   Procedure: Elease Hashimoto DILATION;  Surgeon: Corbin Ade, MD;  [facility];  Service: Endoscopy;  Laterality: N/A;   PARTIAL HYSTERECTOMY     TOTAL KNEE ARTHROPLASTY  07/01/2012   Procedure: TOTAL KNEE ARTHROPLASTY;  Surgeon: Vickki Hearing, MD;  Location: [facility];  Service: Orthopedics;  Laterality: Right;   TOTAL KNEE REVISION Right 01/03/2013   Procedure: Patellaplasty Right Knee;  Surgeon: Vickki Hearing, MD;  Location: [facility];  Service: Orthopedics;  Laterality: Right;   Patient Active Problem List   Diagnosis Date Noted   Primary osteoarthritis of right foot    Syncope 02/18/2020   Prolonged QT interval 02/18/2020   Glucose intolerance 02/18/2020   Normocytic anemia 12/10/2019   Poor appetite 09/24/2019   Abnormal weight loss 09/24/2019   Upper abdominal mass 09/24/2019   Dysphagia 01/30/2019   Simple adnexal cyst greater than 1 cm in diameter in postmenopausal patient 11/08/2018   Abdominal pain 08/09/2015   Dysphagia, pharyngoesophageal phase    H/O adenomatous polyp of colon 05/05/2015   Constipation 02/02/2015   Knee contusion 12/09/2013   Esophageal dysphagia 07/02/2013   Abdominal pain, epigastric 07/02/2013   Chronic pain syndrome 11/28/2012   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   Cellulitis 11/04/2012   Postoperative wound breakdown 11/04/2012   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   Knee pain 08/13/2012   Hypokalemia 08/01/2012   Cellulitis, wound, post-operative 07/30/2012    Degenerative tear of medial meniscus of right knee 01/12/2012   OA (osteoarthritis) of knee 01/12/2012   Right knee sprain 11/02/2011   Instability of knee joint 03/29/2011   URTICARIA 12/21/2009   OTHER URINARY INCONTINENCE 07/18/2009   FOOT PAIN, LEFT 05/26/2009   DERMATITIS, CHRONIC 10/21/2008   IMPAIRED GLUCOSE TOLERANCE 09/09/2008   Chronic back pain 09/09/2008   Hypothyroidism 02/27/2008   Hyperlipidemia 02/27/2008   Anxiety with depression 02/27/2008   Bipolar I disorder, most recent episode (or current) depressed, unspecified 02/27/2008   HYPERTENSION 02/27/2008   ALLERGIC RHINITIS 02/27/2008   GERD 02/27/2008   HEADACHE 02/27/2008    PCP: Elfredia Nevins MD  REFERRING PROVIDER: Nadara Mustard, MD   REFERRING DIAG: [phone] (ICD-10-CM) - Sprain of tarsometatarsal ligament of right foot, subsequent encounter   THERAPY DIAG:  Pain in right ankle and joints of right foot - Plan: PT plan of care cert/re-cert  Difficulty in walking, not elsewhere classified - Plan: PT plan of care cert/re-cert  Rationale for Evaluation and Treatment  Rehabilitation  ONSET DATE: 03/31/22  SUBJECTIVE:   SUBJECTIVE STATEMENT: Patient presents with RT foot and ankle pain s/p RT metatarsal fusion on 03/31/20. She was having trouble for several months and they thought it was a sprain. She wore boot for 6 weeks but did not get any better. She was sent for surgery. She is still having trouble walking, still having pain. She says her sx are no better since surgery. She has not had formal therapy for this. She is still taking pain medication and she is walking with a SPC.   PERTINENT HISTORY: Arthritis, HTN, anxiety  PAIN:  Are you having pain? Yes: NPRS scale: 8/10 Pain location: RT dorsal foot down to great toe  Pain description: throbbing, stinging Aggravating factors: standing, walking Relieving factors: meds, soaking in epsom salt   PRECAUTIONS: None  WEIGHT BEARING RESTRICTIONS  No  FALLS:  Has patient fallen in last 6 months? Yes. Number of falls 2  LIVING ENVIRONMENT: Lives with: lives with their partner Lives in: House/apartment Stairs: No Has following equipment at home: Single point cane and Environmental consultant - 2 wheeled  OCCUPATION: Retired   PLOF: Independent with basic ADLs  PATIENT GOALS Decrease pain and swelling    OBJECTIVE:   DIAGNOSTIC FINDINGS: NA  PATIENT SURVEYS:  FOTO 45% function  COGNITION:  Overall cognitive status: Within functional limits for tasks assessed     SENSATION: Reports numbness/ tingling in RT foot   EDEMA:  Min/ mod edema in RT foot   PALPATION: Hyper sensitive about incision and top of RT foot  LOWER EXTREMITY ROM:  Active ROM Right eval Left eval  Hip flexion    Hip extension    Hip abduction    Hip adduction    Hip internal rotation    Hip external rotation    Knee flexion    Knee extension    Ankle dorsiflexion -6   Ankle plantarflexion 10   Ankle inversion    Ankle eversion     (Blank rows = not tested)  LOWER EXTREMITY MMT:  MMT Right eval Left eval  Hip flexion    Hip extension    Hip abduction    Hip adduction    Hip internal rotation    Hip external rotation    Knee flexion    Knee extension    Ankle dorsiflexion 3-   Ankle plantarflexion    Ankle inversion 3-   Ankle eversion 3-    (Blank rows = not tested)    FUNCTIONAL TESTS:  2 minute walk test: 100 feet  GAIT: Distance walked: 100 feet  Assistive device utilized: Single point cane Level of assistance: Modified independence Comments: decreased stride, antalgic, decreased stance on RT    TODAY'S TREATMENT: Eval Ankle circle Manual RT ankle and great toe PROM flexion/ extension    PATIENT EDUCATION:  Education details: on eval findings, POC and HEP  Person educated: Patient Education method: Explanation Education comprehension: verbalized understanding   HOME EXERCISE PROGRAM: Access Code: [email or web address] URL:  [email or web address] Date: 06/20/2022 Prepared by: Georges Lynch  Exercises - Towel Scrunches  - 3 x daily - 7 x weekly - 3 sets - 10 reps - Ankle Inversion Eversion Towel Slide  - 3 x daily - 7 x weekly - 3 sets - 10 reps - Seated Ankle Circles  - 3 x daily - 7 x weekly - 3 sets - 10 reps - Seated Calf Stretch with Strap  - 3 x daily - 7 x weekly -  1 sets - 4 reps - 30 second hold  ASSESSMENT:  CLINICAL IMPRESSION: Patient is a 71 y.o. female who presents to physical therapy with complaint of RT foot pain. Patient demonstrates muscle weakness, reduced ROM, and fascial restrictions which are likely contributing to symptoms of pain and are negatively impacting patient ability to perform ADLs and functional mobility tasks. Patient will benefit from skilled physical therapy services to address these deficits to reduce pain and improve level of function with ADLs and functional mobility tasks.    OBJECTIVE IMPAIRMENTS Abnormal gait, decreased activity tolerance, decreased balance, decreased endurance, decreased mobility, difficulty walking, decreased ROM, decreased strength, hypomobility, increased edema, increased fascial restrictions, impaired flexibility, impaired sensation, improper body mechanics, and pain.   ACTIVITY LIMITATIONS standing, squatting, stairs, transfers, and locomotion level  PARTICIPATION LIMITATIONS: meal prep, cleaning, laundry, driving, shopping, community activity, and yard work  PERSONAL FACTORS Past/current experiences and Time since onset of injury/illness/exacerbation are also affecting patient's functional outcome.   REHAB POTENTIAL: Good  CLINICAL DECISION MAKING: Stable/uncomplicated  EVALUATION COMPLEXITY: Low   GOALS: SHORT TERM GOALS: Target date: 07/18/2022  Patient will be independent with initial HEP and self-management strategies to improve functional outcomes Baseline:  Goal status: INITIAL   2. Patient will have RT ankle DF  AROM at least 5 degrees to improve functional mobility, gait mechanics and stair ambulation.  Baseline: -6 Goal status: INITIAL  LONG TERM GOALS: Target date: 08/15/2022  Patient will be independent with advanced HEP and self-management strategies to improve functional outcomes Baseline:  Goal status: INITIAL  2.  Patient will improve FOTO score to predicted value to indicate improvement in functional outcomes Baseline: 45% Goal status: INITIAL  3.  Patient will have RT ankle DF AROM at least 10 degrees to improve functional mobility, gait mechanics and stair ambulation.  Baseline: -6 Goal status: INITIAL  4. Patient will have equal to or > 4/5 MMT throughout RT ankle to improve ability to perform functional mobility, stair ambulation and ADLs.  Baseline: See MMT Goal status: INITIAL  5. Patient will be able to ambulate at least 300 feet during with LRAD to demonstrate improved ability to perform functional mobility and associated tasks. Baseline: 100 feet with SPC Goal status: INITIAL   PLAN: PT FREQUENCY: 2x/week  PT DURATION: 8 weeks  PLANNED INTERVENTIONS: Therapeutic exercises, Therapeutic activity, Neuromuscular re-education, Balance training, Gait training, Patient/Family education, Joint manipulation, Joint mobilization, Stair training, Aquatic Therapy, Dry Needling, Electrical stimulation, Spinal manipulation, Spinal mobilization, Cryotherapy, Moist heat, scar mobilization, Taping, Traction, Ultrasound, Biofeedback, Ionotophoresis 4mg /ml Dexamethasone, and Manual therapy.   PLAN FOR NEXT SESSION: Manual Rt ankle PROM and scar tissue massage. Progress ankle strength and mobility as tolerated. Isometrics, BAPs, stretching   10:40 AM, 06/20/22 08/20/22 PT DPT  Physical Therapist with [facility]  [phone]

## 2022-06-20 NOTE — Telephone Encounter (Signed)
Pt called requesting a refill of oxycodone. Please send to pharmacy on file. Pt phone number is [phone].

## 2022-06-21 ENCOUNTER — Telehealth (INDEPENDENT_AMBULATORY_CARE_PROVIDER_SITE_OTHER): Payer: Medicare Other | Admitting: Gastroenterology

## 2022-06-21 ENCOUNTER — Telehealth: Payer: Self-pay | Admitting: Gastroenterology

## 2022-06-21 ENCOUNTER — Encounter: Payer: Self-pay | Admitting: Gastroenterology

## 2022-06-21 VITALS — Ht 63.0 in | Wt 210.0 lb

## 2022-06-21 DIAGNOSIS — R1310 Dysphagia, unspecified: Secondary | ICD-10-CM | POA: Diagnosis not present

## 2022-06-21 DIAGNOSIS — K219 Gastro-esophageal reflux disease without esophagitis: Secondary | ICD-10-CM | POA: Diagnosis not present

## 2022-06-21 DIAGNOSIS — K5900 Constipation, unspecified: Secondary | ICD-10-CM | POA: Diagnosis not present

## 2022-06-21 MED ORDER — LUBIPROSTONE 24 MCG PO CAPS: ORAL_CAPSULE | ORAL | 11 refills | Status: AC

## 2022-06-21 NOTE — Addendum Note (Signed)
Addended by: Cheron Every on: 06/21/2022 03:12 PM   Modules accepted: Orders

## 2022-06-21 NOTE — Telephone Encounter (Signed)
Patient was scheduled at [place] and given appt. She no showed.

## 2022-06-21 NOTE — Telephone Encounter (Signed)
Patient had barium pill esophagram order placed in April 2023 that was not completed.  Please call patient to schedule.  Please notify me if new order needs to be placed.  Dx: dysphagia  [name], MSN, APRN, FNP-BC, AGACNP-BC [facility]

## 2022-06-21 NOTE — Patient Instructions (Addendum)
[name]!!  I am sending in your Amitiza.  Please pick this up from the pharmacy.  You should take 1 capsule twice daily with food.  This should help with your constipation, which in turn should help with some of your abdominal cramping prior to bowel movements.  I want you to continue taking your lactulose 30 mL twice daily as well as your MiraLAX.  Only use suppository if you have gone more than 3 days without a bowel movement.  Please ensure you are intaking adequate amount of water, at least 2-3 bottles of water daily, ensuring you have clear/light yellow urine.  We are going to get you scheduled for a barium pill esophagram to further assess your issues with swallowing.  You should take small bites of food and chew at least 20-25 times prior to swallowing and alternate with sips of liquids.  Ensure you are cutting up your food into small bites as well and allow additional time between bites.   Proceed to ED if you feel like you are unable to get food to come back up or go down and unable to pass liquids.  This may warrant urgent upper endoscopy  Follow-up in 3 months.  It was a pleasure to see you today. I want to create trusting relationships with patients. If you receive a survey regarding your visit,  I greatly appreciate you taking time to fill this out on paper or through your MyChart. I value your feedback.  [name], MSN, FNP-BC, AGACNP-BC [facility]

## 2022-06-21 NOTE — Telephone Encounter (Signed)
BPE scheduled for 10/18, arrival 9:15am, npo 3 hrs prior.  Called pt, no answer and not able to leave VM

## 2022-06-23 ENCOUNTER — Telehealth: Payer: Self-pay | Admitting: *Deleted

## 2022-06-23 NOTE — Telephone Encounter (Signed)
Called pt and is aware of appt details.  °

## 2022-06-23 NOTE — Chronic Care Management (AMB) (Unsigned)
  Care Coordination Note  06/23/2022 Name: Diane [name] MRN: [id number] DOB: 10-23-1950  [name] is a 71 y.o. year old female who is a primary care patient of [employer], MD and is actively engaged with the care management team. I reached out to [name] by phone today to assist with re-scheduling a follow up visit with the RN Case Manager  Follow up plan: Unsuccessful telephone outreach attempt made. A HIPAA compliant phone message was left for the patient providing contact information and requesting a return call.  The care management team will reach out to the patient again over the next 7 days.   [place]  Direct Dial: [phone]

## 2022-06-28 ENCOUNTER — Ambulatory Visit (HOSPITAL_COMMUNITY)
Admission: RE | Admit: 2022-06-28 | Discharge: 2022-06-28 | Disposition: A | Discharge status: Home / Self Care | Payer: Medicare Other | Source: Ambulatory Visit | Attending: Gastroenterology | Admitting: Gastroenterology

## 2022-06-28 DIAGNOSIS — R1310 Dysphagia, unspecified: Secondary | ICD-10-CM | POA: Diagnosis not present

## 2022-06-28 NOTE — Chronic Care Management (AMB) (Signed)
  Care Coordination Note  06/28/2022 Name: [name] MRN: [id number] DOB: [birth year]  Diane [name] is a 71 y.o. year old female who is a primary care patient of [employer], MD and is actively engaged with the care management team. I reached out to Diane [name] by phone today to assist with re-scheduling a follow up visit with the RN Case Manager  Follow up plan: Telephone appointment with care management team member scheduled for:07/05/22   [place]  Direct Dial: [phone]

## 2022-06-29 ENCOUNTER — Ambulatory Visit (INDEPENDENT_AMBULATORY_CARE_PROVIDER_SITE_OTHER): Payer: Medicare Other

## 2022-06-29 ENCOUNTER — Ambulatory Visit (INDEPENDENT_AMBULATORY_CARE_PROVIDER_SITE_OTHER): Payer: Medicare Other | Admitting: Orthopedic Surgery

## 2022-06-29 ENCOUNTER — Ambulatory Visit (HOSPITAL_COMMUNITY): Payer: Medicare Other

## 2022-06-29 DIAGNOSIS — R262 Difficulty in walking, not elsewhere classified: Secondary | ICD-10-CM | POA: Diagnosis not present

## 2022-06-29 DIAGNOSIS — M12571 Traumatic arthropathy, right ankle and foot: Secondary | ICD-10-CM | POA: Diagnosis not present

## 2022-06-29 DIAGNOSIS — M79671 Pain in right foot: Secondary | ICD-10-CM

## 2022-06-29 DIAGNOSIS — M25571 Pain in right ankle and joints of right foot: Secondary | ICD-10-CM

## 2022-06-29 MED ORDER — OXYCODONE-ACETAMINOPHEN 5-325 MG PO TABS: 1.0000 | ORAL_TABLET | Freq: Four times a day (QID) | ORAL | 0 refills | Status: DC | PRN

## 2022-06-29 NOTE — Therapy (Signed)
OUTPATIENT PHYSICAL THERAPY LOWER EXTREMITY TREATMENT   Patient Name: Diane [name] MRN: [id number] DOB:[birth year], 71 y.o., female Today's Date: 06/29/2022   PT End of Session - 06/29/22 0857     Visit Number 2    Number of Visits 16    Date for PT Re-Evaluation 08/15/22    Authorization Type [employer] Medicare    Progress Note Due on Visit 10    PT Start Time [phone]    PT Stop Time 0940    PT Time Calculation (min) 44 min    Activity Tolerance Patient tolerated treatment well;Patient limited by pain    Behavior During Therapy WFL for tasks assessed/performed              Past Medical History:  Diagnosis Date   Allergic rhinitis    Anxiety    Arthritis    Cataracts, bilateral    Chronic back pain    Sees pain [facility]   Chronic pain syndrome    Constipation    Depression    Essential hypertension    GERD (gastroesophageal reflux disease)    Headaches, cluster    History of seizures    Hypothyroid    Mania (HCC)    Mixed hyperlipidemia    PTSD (post-traumatic stress disorder)    Right lumbar radiculopathy    Sleep apnea    STOP BANG score=4   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[facility])    Past Surgical History:  Procedure Laterality Date   BACK SURGERY     BILATERAL OOPHORECTOMY     CATARACT EXTRACTION W/PHACO Left 01/04/2016   Procedure: CATARACT EXTRACTION PHACO AND INTRAOCULAR LENS PLACEMENT (IOC);  Surgeon: Jethro Bolus, MD;  Location: [facility];  Service: Ophthalmology;  Laterality: Left;  CDE:4.70   CATARACT EXTRACTION W/PHACO Right 01/18/2016   Procedure: CATARACT EXTRACTION PHACO AND INTRAOCULAR LENS PLACEMENT RIGHT EYE; CDE:  4.66;  Surgeon: Jethro Bolus, MD;  Location: [facility];  Service: Ophthalmology;  Laterality: Right;   CHOLECYSTECTOMY     COLONOSCOPY  10/16/2008   ACZ:[id number], otherwise normal rectum; pancolonic diverticula the remainder of the colonic mucosa appeared normal. Next TCS due 10/2013.   COLONOSCOPY  06/28/2006   RMR: Internal  hemorrhoids. Diminutive rectal polyps, cold biopsied/ Pancolonic diverticula. Polyps in the right colon removed    COLONOSCOPY WITH PROPOFOL N/A 05/20/2015   RMR: Pancolonic diverticulosis. Redundant colon    COLONOSCOPY WITH PROPOFOL N/A 11/11/2020   Procedure: COLONOSCOPY WITH PROPOFOL;  Surgeon: Corbin Ade, MD;  Location: [facility];  Service: Endoscopy;  Laterality: N/A;  pm ASA 2   ESOPHAGOGASTRODUODENOSCOPY  10/16/2008   XNA:TFTDD hiatal hernia otherwise normal esophagus, stomach  D1, D2, status post passage of a 56-French Maloney dilator   ESOPHAGOGASTRODUODENOSCOPY  06/28/2006   UKG:URKYHC esophagus. Small hiatal hernia. Otherwise normal stomach, D1 and D2, status post passage of a 56 French Maloney dilator   ESOPHAGOGASTRODUODENOSCOPY (EGD) WITH ESOPHAGEAL DILATION N/A 07/14/2013   RMR: Abnormal esophagus of uncertain significance-status post esophageal biospy. small hiatal hernia.    ESOPHAGOGASTRODUODENOSCOPY (EGD) WITH PROPOFOL N/A 05/20/2015   RMR: NORMAL EGD status post maloney dilation    ESOPHAGOGASTRODUODENOSCOPY (EGD) WITH PROPOFOL N/A 12/23/2018   Dr. Jena Gauss: normal esophagus, s/p esophageal dilation.    FOOT ARTHRODESIS Right 03/31/2022   Procedure: RIGHT FOOT TARSAL-METATARSAL FUSION;  Surgeon: Nadara Mustard, MD;  Location: [facility] OR;  Service: Orthopedics;  Laterality: Right;   FOOT SURGERY     left foot-   GASTROCNEMIUS RECESSION Right 03/31/2022  Procedure: RIGHT ACHILLES LENGTHENING;  Surgeon: Nadara Mustard, MD;  [facility] OR;  Service: Orthopedics;  Laterality: Right;   INCISION AND DRAINAGE Right 01/03/2013   Procedure: INCISION AND DRAINAGE;  Surgeon: Vickki Hearing, MD;  Location: [facility];  Service: Orthopedics;  Laterality: Right;   KNEE ARTHROSCOPY  01/2012   right   KNEE ARTHROSCOPY WITH LATERAL RELEASE Right 01/03/2013   Procedure: Lateral Release Patella Right Knee;  Surgeon: Vickki Hearing, MD;  Location: [facility];  Service: Orthopedics;  Laterality:  Right;   LAPAROSCOPIC LYSIS OF ADHESIONS N/A 11/12/2019   Procedure: LAPAROSCOPIC LYSIS OF ADHESIONS, extensive; drainage of peritoneal cyst;  Surgeon: Lazaro Arms, MD;  Location: [facility];  Service: Gynecology;  Laterality: N/A;   LUMBAR DISC SURGERY  L4-5   MALONEY DILATION N/A 05/20/2015   Procedure: Elease Hashimoto DILATION;  Surgeon: Corbin Ade, MD;  Location: [facility];  Service: Endoscopy;  Laterality: N/AElease Hashimoto dilator # 54   MALONEY DILATION N/A 12/23/2018   Procedure: Elease Hashimoto DILATION;  Surgeon: Corbin Ade, MD;  [facility]: [facility];  Service: Endoscopy;  Laterality: N/A;   PARTIAL HYSTERECTOMY     TOTAL KNEE ARTHROPLASTY  07/01/2012   Procedure: TOTAL KNEE ARTHROPLASTY;  Surgeon: Vickki Hearing, MD;  Location: [facility];  Service: Orthopedics;  Laterality: Right;   TOTAL KNEE REVISION Right 01/03/2013   Procedure: Patellaplasty Right Knee;  Surgeon: Vickki Hearing, MD;  Location: [facility];  Service: Orthopedics;  Laterality: Right;   Patient Active Problem List   Diagnosis Date Noted   Primary osteoarthritis of right foot    Syncope 02/18/2020   Prolonged QT interval 02/18/2020   Glucose intolerance 02/18/2020   Normocytic anemia 12/10/2019   Poor appetite 09/24/2019   Abnormal weight loss 09/24/2019   Upper abdominal mass 09/24/2019   Dysphagia 01/30/2019   Simple adnexal cyst greater than 1 cm in diameter in postmenopausal patient 11/08/2018   Abdominal pain 08/09/2015   Dysphagia, pharyngoesophageal phase    H/O adenomatous polyp of colon 05/05/2015   Constipation 02/02/2015   Knee contusion 12/09/2013   Esophageal dysphagia 07/02/2013   Abdominal pain, epigastric 07/02/2013   Chronic pain syndrome 11/28/2012   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   Cellulitis 11/04/2012   Postoperative wound breakdown 11/04/2012   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   Knee pain 08/13/2012   Hypokalemia 08/01/2012   Cellulitis, wound, post-operative 07/30/2012    Degenerative tear of medial meniscus of right knee 01/12/2012   OA (osteoarthritis) of knee 01/12/2012   Right knee sprain 11/02/2011   Instability of knee joint 03/29/2011   URTICARIA 12/21/2009   OTHER URINARY INCONTINENCE 07/18/2009   FOOT PAIN, LEFT 05/26/2009   DERMATITIS, CHRONIC 10/21/2008   IMPAIRED GLUCOSE TOLERANCE 09/09/2008   Chronic back pain 09/09/2008   Hypothyroidism 02/27/2008   Hyperlipidemia 02/27/2008   Anxiety with depression 02/27/2008   Bipolar I disorder, most recent episode (or current) depressed, unspecified 02/27/2008   HYPERTENSION 02/27/2008   ALLERGIC RHINITIS 02/27/2008   GERD 02/27/2008   HEADACHE 02/27/2008    PCP: Elfredia Nevins MD  REFERRING PROVIDER: Nadara Mustard, MD   REFERRING DIAG: [phone] (ICD-10-CM) - Sprain of tarsometatarsal ligament of right foot, subsequent encounter   THERAPY DIAG:  Pain in right ankle and joints of right foot  Difficulty in walking, not elsewhere classified  Rationale for Evaluation and Treatment Rehabilitation  ONSET DATE: 03/31/22  SUBJECTIVE:   SUBJECTIVE STATEMENT: Pain is about  the same today.  Has an MD appt this afternoon with Dr. Lajoyce Corners.    Eval: Patient presents with RT foot and ankle pain s/p RT metatarsal fusion on 03/31/20. She was having trouble for several months and they thought it was a sprain. She wore boot for 6 weeks but did not get any better. She was sent for surgery. She is still having trouble walking, still having pain. She says her sx are no better since surgery. She has not had formal therapy for this. She is still taking pain medication and she is walking with a SPC.   PERTINENT HISTORY: Arthritis, HTN, anxiety  PAIN:  Are you having pain? Yes: NPRS scale: 9/10 Pain location: RT dorsal foot down to great toe  Pain description: throbbing, stinging Aggravating factors: standing, walking Relieving factors: meds, soaking in epsom salt   PRECAUTIONS: None  WEIGHT BEARING  RESTRICTIONS No  FALLS:  Has patient fallen in last 6 months? Yes. Number of falls 2  LIVING ENVIRONMENT: Lives with: lives with their partner Lives in: House/apartment Stairs: No Has following equipment at home: Single point cane and Environmental consultant - 2 wheeled  OCCUPATION: Retired   PLOF: Independent with basic ADLs  PATIENT GOALS Decrease pain and swelling    OBJECTIVE:   DIAGNOSTIC FINDINGS: NA  PATIENT SURVEYS:  FOTO 45% function  COGNITION:  Overall cognitive status: Within functional limits for tasks assessed     SENSATION: Reports numbness/ tingling in RT foot   EDEMA:  Min/ mod edema in RT foot   PALPATION: Hyper sensitive about incision and top of RT foot  LOWER EXTREMITY ROM:  Active ROM Right eval Left eval  Hip flexion    Hip extension    Hip abduction    Hip adduction    Hip internal rotation    Hip external rotation    Knee flexion    Knee extension    Ankle dorsiflexion -6   Ankle plantarflexion 10   Ankle inversion    Ankle eversion     (Blank rows = not tested)  LOWER EXTREMITY MMT:  MMT Right eval Left eval  Hip flexion    Hip extension    Hip abduction    Hip adduction    Hip internal rotation    Hip external rotation    Knee flexion    Knee extension    Ankle dorsiflexion 3-   Ankle plantarflexion    Ankle inversion 3-   Ankle eversion 3-    (Blank rows = not tested)    FUNCTIONAL TESTS:  2 minute walk test: 100 feet  GAIT: Distance walked: 100 feet  Assistive device utilized: Single point cane Level of assistance: Modified independence Comments: decreased stride, antalgic, decreased stance on RT    TODAY'S TREATMENT: 06/29/22 Review of HEP and goals Seated  Ankle circles CW and CCW x 1' each Heel slides on 4" step x 2' for dorsiflexion and knee flexion Heel/toe raises on 4" step x 1' Towel crunches x 1'  Towel inversion/eversion x 1' Dorsiflexion stretch with strap 5 x 20" Manual isometrics inv/ev/plantar  flexion 5" x 10 each Rocker board F/B and S/S x 1' each  STM to Right foot to decrease pain and improve tissue mobility x 8' manual dorsiflexion stretch 20" x 5; no other treatment performed during manual treatment  Eval Ankle circle Manual RT ankle and great toe PROM flexion/ extension    PATIENT EDUCATION:  Education details: on eval findings, POC and HEP  Person  educated: Patient Education method: Explanation Education comprehension: verbalized understanding   HOME EXERCISE PROGRAM: Access Code: [email or web address] URL: [email or web address] Date: 06/20/2022 Prepared by: Josue Hector  Exercises - Towel Scrunches  - 3 x daily - 7 x weekly - 3 sets - 10 reps - Ankle Inversion Eversion Towel Slide  - 3 x daily - 7 x weekly - 3 sets - 10 reps - Seated Ankle Circles  - 3 x daily - 7 x weekly - 3 sets - 10 reps - Seated Calf Stretch with Strap  - 3 x daily - 7 x weekly - 1 sets - 4 reps - 30 second hold  ASSESSMENT:  CLINICAL IMPRESSION: Today's treatment began with review of HEP and goals. Patient verbalizes agreement with set rehab goals.  Pain on dorsum of foot and 1st ray with dorsiflexion stretch.  Trial of STM today to decrease pain and increase tissue mobility.  Added in isometrics and rocker board for mobility; swelling noted foot and ankle right. Patient will benefit from skilled physical therapy services to address these deficits to reduce pain and improve level of function with ADLs and functional mobility tasks.    OBJECTIVE IMPAIRMENTS Abnormal gait, decreased activity tolerance, decreased balance, decreased endurance, decreased mobility, difficulty walking, decreased ROM, decreased strength, hypomobility, increased edema, increased fascial restrictions, impaired flexibility, impaired sensation, improper body mechanics, and pain.   ACTIVITY LIMITATIONS standing, squatting, stairs, transfers, and locomotion level  PARTICIPATION LIMITATIONS: meal prep, cleaning,  laundry, driving, shopping, community activity, and yard work  PERSONAL FACTORS Past/current experiences and Time since onset of injury/illness/exacerbation are also affecting patient's functional outcome.   REHAB POTENTIAL: Good  CLINICAL DECISION MAKING: Stable/uncomplicated  EVALUATION COMPLEXITY: Low   GOALS: SHORT TERM GOALS: Target date: 07/18/2022  Patient will be independent with initial HEP and self-management strategies to improve functional outcomes Baseline:  Goal status: IN PROGRESS   2. Patient will have RT ankle DF AROM at least 5 degrees to improve functional mobility, gait mechanics and stair ambulation.  Baseline: -6 Goal status: INITIAL  LONG TERM GOALS: Target date: 08/15/2022  Patient will be independent with advanced HEP and self-management strategies to improve functional outcomes Baseline:  Goal status: IN PROGRESS  2.  Patient will improve FOTO score to predicted value to indicate improvement in functional outcomes Baseline: 45% Goal status: IN PROGRESS  3.  Patient will have RT ankle DF AROM at least 10 degrees to improve functional mobility, gait mechanics and stair ambulation.  Baseline: -6 Goal status: IN PROGRESS  4. Patient will have equal to or > 4/5 MMT throughout RT ankle to improve ability to perform functional mobility, stair ambulation and ADLs.  Baseline: See MMT Goal status: IN PROGRESS  5. Patient will be able to ambulate at least 300 feet during 2MWT with LRAD to demonstrate improved ability to perform functional mobility and associated tasks. Baseline: 100 feet with SPC Goal status: IN PROGRESS   PLAN: PT FREQUENCY: 2x/week  PT DURATION: 8 weeks  PLANNED INTERVENTIONS: Therapeutic exercises, Therapeutic activity, Neuromuscular re-education, Balance training, Gait training, Patient/Family education, Joint manipulation, Joint mobilization, Stair training, Aquatic Therapy, Dry Needling, Electrical stimulation, Spinal manipulation,  Spinal mobilization, Cryotherapy, Moist heat, scar mobilization, Taping, Traction, Ultrasound, Biofeedback, Ionotophoresis 4mg /ml Dexamethasone, and Manual therapy.   PLAN FOR NEXT SESSION: Manual Rt ankle PROM and scar tissue massage. Progress ankle strength and mobility as tolerated. Isometrics, BAPs, stretching   8:58 AM, 06/29/22 Alvaretta Eisenberger Small Willine Schwalbe MPT [facility] [place] [phone]

## 2022-07-03 ENCOUNTER — Encounter: Payer: Self-pay | Admitting: Orthopedic Surgery

## 2022-07-03 NOTE — Progress Notes (Signed)
Office Visit Note   Patient: Diane [name]           Date of Birth: Dec 21, 1950           MRN: [id number] Visit Date: 06/29/2022              Requested by: Redmond School, MD [address] [place],  [place] [address] PCP: Redmond School, MD  Chief Complaint  Patient presents with   Right Foot - Routine Post Op    03/31/2022 right foot tarsal MT fusion and achilles lengthening       HPI: Patient is a 71 year old woman who is 3 months status post right foot tarsal metatarsal fusion and Achilles lengthening.  Assessment & Plan: Visit Diagnoses:  1. Pain in right foot     Plan: Patient will continue her physical therapy continue with Aleve 2 p.o. twice daily and she will try Voltaren gel.  Follow-Up Instructions: Return in about 4 weeks (around 07/27/2022).   Ortho Exam  Patient is alert, oriented, no adenopathy, well-dressed, normal affect, normal respiratory effort. Patient has been to 2 physical therapy sessions in [place].  Radiograph shows stable fusion she has pain in the first metatarsal.  Patient was provided prescription for Percocet continue to increase her activities as tolerated.  Imaging: No results found. No images are attached to the encounter.  Labs: Lab Results  Component Value Date   HGBA1C 5.5 02/19/2020   HGBA1C 5.8 (H) 01/25/2012   HGBA1C 5.7 07/13/2009   ESRSEDRATE 2 03/26/2018   ESRSEDRATE 4 03/16/2014   ESRSEDRATE 40 (H) 12/30/2012   CRP 0.8 03/26/2018   CRP <0.5 03/16/2014   CRP 1.6 (H) 12/30/2012   REPTSTATUS 10/31/2018 FINAL 10/28/2018   GRAMSTAIN  01/03/2013    RARE WBC PRESENT, PREDOMINANTLY MONONUCLEAR NO SQUAMOUS EPITHELIAL CELLS SEEN NO ORGANISMS SEEN   GRAMSTAIN  01/03/2013    RARE WBC PRESENT, PREDOMINANTLY PMN NO SQUAMOUS EPITHELIAL CELLS SEEN NO ORGANISMS SEEN   GRAMSTAIN  01/03/2013    RARE WBC PRESENT,BOTH PMN AND MONONUCLEAR NO SQUAMOUS EPITHELIAL CELLS SEEN NO ORGANISMS SEEN   GRAMSTAIN  01/03/2013    RARE  WBC PRESENT, PREDOMINANTLY PMN NO SQUAMOUS EPITHELIAL CELLS SEEN NO ORGANISMS SEEN   GRAMSTAIN  01/03/2013    RARE WBC PRESENT,BOTH PMN AND MONONUCLEAR NO SQUAMOUS EPITHELIAL CELLS SEEN NO ORGANISMS SEEN   GRAMSTAIN  01/03/2013    RARE WBC PRESENT, PREDOMINANTLY MONONUCLEAR NO SQUAMOUS EPITHELIAL CELLS SEEN NO ORGANISMS SEEN   CULT >=100,000 COLONIES/mL ESCHERICHIA COLI (A) 10/28/2018   LABORGA ESCHERICHIA COLI (A) 10/28/2018     Lab Results  Component Value Date   ALBUMIN 3.5 02/18/2020   ALBUMIN 3.4 (L) 11/10/2019   ALBUMIN 4.1 10/28/2018    Lab Results  Component Value Date   MG 1.7 02/18/2020   MG 2.2 10/28/2018   No results found for: "VD25OH"  No results found for: "PREALBUMIN"    Latest Ref Rng & Units 03/31/2022    9:37 AM 09/06/2021    3:19 PM 06/30/2020    2:32 PM  CBC EXTENDED  WBC 4.0 - 10.5 K/uL 5.2  6.0  8.6   RBC 3.87 - 5.11 MIL/uL 4.17  4.21  4.12   Hemoglobin 12.0 - 15.0 g/dL 12.1  11.8  11.7   HCT 36.0 - 46.0 % 39.7  37.6  36.2   Platelets 150 - 400 K/uL 158  182  235   NEUT# 1,500 - 7,800 cells/uL   4,180   Lymph# 850 -  3,900 cells/uL   3,861      There is no height or weight on file to calculate BMI.  Orders:  Orders Placed This Encounter  Procedures   XR Foot Complete Right   Meds ordered this encounter  Medications   oxyCODONE-acetaminophen (PERCOCET/ROXICET) 5-325 MG tablet    Sig: Take 1 tablet by mouth every 6 (six) hours as needed.    Dispense:  30 tablet    Refill:  0     Procedures: No procedures performed  Clinical Data: No additional findings.  ROS:  All other systems negative, except as noted in the HPI. Review of Systems  Objective: Vital Signs: There were no vitals taken for this visit.  Specialty Comments:  No specialty comments available.  PMFS History: Patient Active Problem List   Diagnosis Date Noted   Primary osteoarthritis of right foot    Syncope 02/18/2020   Prolonged QT interval 02/18/2020    Glucose intolerance 02/18/2020   Normocytic anemia 12/10/2019   Poor appetite 09/24/2019   Abnormal weight loss 09/24/2019   Upper abdominal mass 09/24/2019   Dysphagia 01/30/2019   Simple adnexal cyst greater than 1 cm in diameter in postmenopausal patient 11/08/2018   Abdominal pain 08/09/2015   Dysphagia, pharyngoesophageal phase    H/O adenomatous polyp of colon 05/05/2015   Constipation 02/02/2015   Knee contusion 12/09/2013   Esophageal dysphagia 07/02/2013   Abdominal pain, epigastric 07/02/2013   Chronic pain syndrome 11/28/2012   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   Cellulitis 11/04/2012   Postoperative wound breakdown 11/04/2012   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   Knee pain 08/13/2012   Hypokalemia 08/01/2012   Cellulitis, wound, post-operative 07/30/2012   Degenerative tear of medial meniscus of right knee 01/12/2012   OA (osteoarthritis) of knee 01/12/2012   Right knee sprain 11/02/2011   Instability of knee joint 03/29/2011   URTICARIA 12/21/2009   OTHER URINARY INCONTINENCE 07/18/2009   FOOT PAIN, LEFT 05/26/2009   DERMATITIS, CHRONIC 10/21/2008   IMPAIRED GLUCOSE TOLERANCE 09/09/2008   Chronic back pain 09/09/2008   Hypothyroidism 02/27/2008   Hyperlipidemia 02/27/2008   Anxiety with depression 02/27/2008   Bipolar I disorder, most recent episode (or current) depressed, unspecified 02/27/2008   HYPERTENSION 02/27/2008   ALLERGIC RHINITIS 02/27/2008   GERD 02/27/2008   HEADACHE 02/27/2008   Past Medical History:  Diagnosis Date   Allergic rhinitis    Anxiety    Arthritis    Cataracts, bilateral    Chronic back pain    Sees pain mamagement clinic   Chronic pain syndrome    Constipation    Depression    Essential hypertension    GERD (gastroesophageal reflux disease)    Headaches, cluster    History of seizures    Hypothyroid    Mania (HCC)    Mixed hyperlipidemia    PTSD (post-traumatic stress disorder)    Right lumbar  radiculopathy    Sleep apnea    STOP BANG score=4   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(HCC)     Family History  Problem Relation Age of Onset   Alcohol abuse Father    Anxiety disorder Father    Alcohol abuse Mother    Anxiety disorder Mother    Alcohol abuse Brother    Anxiety disorder Brother    Drug abuse Brother    Drug abuse Brother    Alcohol abuse Brother    Anxiety disorder Brother    Seizures Brother    Alcohol  abuse Brother    Anxiety disorder Brother    Seizures Brother    Alcohol abuse Brother    Anxiety disorder Brother    Alcohol abuse Brother    Anxiety disorder Brother    Breast cancer Sister    Bipolar disorder Sister    Breast cancer Sister    Dementia Paternal Aunt    ADD / ADHD Grandchild    ADD / ADHD Grandchild    Heart disease Other    Diabetes Other    Alcohol abuse Other    Thyroid disease Son    Thyroid disease Daughter    Depression Neg Hx    OCD Neg Hx    Paranoid behavior Neg Hx    Schizophrenia Neg Hx    Sexual abuse Neg Hx    Physical abuse Neg Hx    Colon cancer Neg Hx     Past Surgical History:  Procedure Laterality Date   BACK SURGERY     BILATERAL OOPHORECTOMY     CATARACT EXTRACTION W/PHACO Left 01/04/2016   Procedure: CATARACT EXTRACTION PHACO AND INTRAOCULAR LENS PLACEMENT (IOC);  Surgeon: Jethro Bolus, MD;  Location: [facility];  Service: Ophthalmology;  Laterality: Left;  CDE:4.70   CATARACT EXTRACTION W/PHACO Right 01/18/2016   Procedure: CATARACT EXTRACTION PHACO AND INTRAOCULAR LENS PLACEMENT RIGHT EYE; CDE:  4.66;  Surgeon: Jethro Bolus, MD;  Location: [facility];  Service: Ophthalmology;  Laterality: Right;   CHOLECYSTECTOMY     COLONOSCOPY  10/16/2008   NGE:[id number], otherwise normal rectum; pancolonic diverticula the remainder of the colonic mucosa appeared normal. Next TCS due 10/2013.   COLONOSCOPY  06/28/2006   RMR: Internal hemorrhoids. Diminutive rectal polyps, cold biopsied/ Pancolonic diverticula. Polyps in the right  colon removed    COLONOSCOPY WITH PROPOFOL N/A 05/20/2015   RMR: Pancolonic diverticulosis. Redundant colon    COLONOSCOPY WITH PROPOFOL N/A 11/11/2020   Procedure: COLONOSCOPY WITH PROPOFOL;  Surgeon: Corbin Ade, MD;  Location: [facility];  Service: Endoscopy;  Laterality: N/A;  pm ASA 2   ESOPHAGOGASTRODUODENOSCOPY  10/16/2008   NUU:VOZDG hiatal hernia otherwise normal esophagus, stomach  D1, D2, status post passage of a 56-French Maloney dilator   ESOPHAGOGASTRODUODENOSCOPY  06/28/2006   UYQ:IHKVQQ esophagus. Small hiatal hernia. Otherwise normal stomach, D1 and D2, status post passage of a 56 French Maloney dilator   ESOPHAGOGASTRODUODENOSCOPY (EGD) WITH ESOPHAGEAL DILATION N/A 07/14/2013   RMR: Abnormal esophagus of uncertain significance-status post esophageal biospy. small hiatal hernia.    ESOPHAGOGASTRODUODENOSCOPY (EGD) WITH PROPOFOL N/A 05/20/2015   RMR: NORMAL EGD status post maloney dilation    ESOPHAGOGASTRODUODENOSCOPY (EGD) WITH PROPOFOL N/A 12/23/2018   Dr. Jena Gauss: normal esophagus, s/p esophageal dilation.    FOOT ARTHRODESIS Right 03/31/2022   Procedure: RIGHT FOOT TARSAL-METATARSAL FUSION;  Surgeon: Nadara Mustard, MD;  Location[facility];  Service: Orthopedics;  Laterality: Right;   FOOT SURGERY     left foot-   GASTROCNEMIUS RECESSION Right 03/31/2022   Procedure: RIGHT ACHILLES LENGTHENING;  Surgeon: Nadara Mustard, MD;  Location: [facility] OR;  Service: Orthopedics;  Laterality: Right;   INCISION AND DRAINAGE Right 01/03/2013   Procedure: INCISION AND DRAINAGE;  Surgeon: Vickki Hearing, MD;  Location: [facility];  Service: Orthopedics;  Laterality: Right;   KNEE ARTHROSCOPY  01/2012   right   KNEE ARTHROSCOPY WITH LATERAL RELEASE Right 01/03/2013   Procedure: Lateral Release Patella Right Knee;  Surgeon: Vickki Hearing, MD;  Location: [facility];  Service: Orthopedics;  Laterality: Right;  LAPAROSCOPIC LYSIS OF ADHESIONS N/A 11/12/2019   Procedure: LAPAROSCOPIC LYSIS OF ADHESIONS,  extensive; drainage of peritoneal cyst;  Surgeon: Lazaro Arms, MD;  Location: [facility];  Service: Gynecology;  Laterality: N/A;   LUMBAR DISC SURGERY  L4-5   MALONEY DILATION N/A 05/20/2015   Procedure: Elease Hashimoto DILATION;  Surgeon: Corbin Ade, MD;  Location: [facility];  Service: Endoscopy;  Laterality: N/AElease Hashimoto dilator # 54   MALONEY DILATION N/A 12/23/2018   Procedure: Elease Hashimoto DILATION;  Surgeon: Corbin Ade, MD;  Location: [facility];  Service: Endoscopy;  Laterality: N/A;   PARTIAL HYSTERECTOMY     TOTAL KNEE ARTHROPLASTY  07/01/2012   Procedure: TOTAL KNEE ARTHROPLASTY;  Surgeon: Vickki Hearing, MD;  Location: [facility];  Service: Orthopedics;  Laterality: Right;   TOTAL KNEE REVISION Right 01/03/2013   Procedure: Patellaplasty Right Knee;  Surgeon: Vickki Hearing, MD;  Location: [facility];  Service: Orthopedics;  Laterality: Right;   Social History   Occupational History   Occupation: disabled    Associate Professor: UNEMPLOYED  Tobacco Use   Smoking status: Former    Packs/day: 0.25    Years: 5.00    Total pack years: 1.25    Types: Cigarettes    Quit date: 06/26/2003    Years since quitting: 19.0   Smokeless tobacco: Never  Vaping Use   Vaping Use: Never used  Substance and Sexual Activity   Alcohol use: Not Currently    Comment: occasionally; once a month.   Drug use: Not Currently    Comment: has a past history of street drug use   Sexual activity: Yes    Birth control/protection: Surgical    Comment: hyst

## 2022-07-04 ENCOUNTER — Ambulatory Visit (HOSPITAL_COMMUNITY): Payer: Medicare Other | Admitting: Physical Therapy

## 2022-07-04 DIAGNOSIS — M12571 Traumatic arthropathy, right ankle and foot: Secondary | ICD-10-CM | POA: Diagnosis not present

## 2022-07-04 DIAGNOSIS — M25571 Pain in right ankle and joints of right foot: Secondary | ICD-10-CM

## 2022-07-04 DIAGNOSIS — R262 Difficulty in walking, not elsewhere classified: Secondary | ICD-10-CM

## 2022-07-04 NOTE — Telephone Encounter (Signed)
scheduled

## 2022-07-04 NOTE — Therapy (Signed)
OUTPATIENT PHYSICAL THERAPY LOWER EXTREMITY TREATMENT   Patient Name: Diane [name] MRN: [id number] DOB:05/04/1951, 71 y.o., female Today's Date: 07/04/2022   PT End of Session - 07/04/22 0859     Visit Number 3    Number of Visits 16    Date for PT Re-Evaluation 08/15/22    Authorization Type [employer] Medicare    Progress Note Due on Visit 10    PT Start Time 0903    PT Stop Time 0941    PT Time Calculation (min) 38 min    Activity Tolerance Patient tolerated treatment well;Patient limited by pain    Behavior During Therapy WFL for tasks assessed/performed              Past Medical History:  Diagnosis Date   Allergic rhinitis    Anxiety    Arthritis    Cataracts, bilateral    Chronic back pain    Sees pain [facility]   Chronic pain syndrome    Constipation    Depression    Essential hypertension    GERD (gastroesophageal reflux disease)    Headaches, cluster    History of seizures    Hypothyroid    Mania (HCC)    Mixed hyperlipidemia    PTSD (post-traumatic stress disorder)    Right lumbar radiculopathy    Sleep apnea    STOP BANG score=4   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[facility])    Past Surgical History:  Procedure Laterality Date   BACK SURGERY     BILATERAL OOPHORECTOMY     CATARACT EXTRACTION W/PHACO Left 01/04/2016   Procedure: CATARACT EXTRACTION PHACO AND INTRAOCULAR LENS PLACEMENT ([place]);  Surgeon: Rutherford Guys, MD;  Location: [facility];  Service: Ophthalmology;  Laterality: Left;  CDE:4.70   CATARACT EXTRACTION W/PHACO Right 01/18/2016   Procedure: CATARACT EXTRACTION PHACO AND INTRAOCULAR LENS PLACEMENT RIGHT EYE; CDE:  4.66;  Surgeon: Rutherford Guys, MD;  Location: [facility];  Service: Ophthalmology;  Laterality: Right;   CHOLECYSTECTOMY     COLONOSCOPY  10/16/2008   HEN:IDPOEUMPNTI, otherwise normal rectum; pancolonic diverticula the remainder of the colonic mucosa appeared normal. Next TCS due 10/2013.   COLONOSCOPY  06/28/2006   RMR: Internal  hemorrhoids. Diminutive rectal polyps, cold biopsied/ Pancolonic diverticula. Polyps in the right colon removed    COLONOSCOPY WITH PROPOFOL N/A 05/20/2015   RMR: Pancolonic diverticulosis. Redundant colon    COLONOSCOPY WITH PROPOFOL N/A 11/11/2020   Procedure: COLONOSCOPY WITH PROPOFOL;  Surgeon: Daneil Dolin, MD;  [facility];  Service: Endoscopy;  Laterality: N/A;  pm ASA 2   ESOPHAGOGASTRODUODENOSCOPY  10/16/2008   RWE:RXVQM hiatal hernia otherwise normal esophagus, stomach  D1, D2, status post passage of a 56-French Maloney dilator   ESOPHAGOGASTRODUODENOSCOPY  06/28/2006   GQQ:PYPPJK esophagus. Small hiatal hernia. Otherwise normal stomach, D1 and D2, status post passage of a 56 French Maloney dilator   ESOPHAGOGASTRODUODENOSCOPY (EGD) WITH ESOPHAGEAL DILATION N/A 07/14/2013   RMR: Abnormal esophagus of uncertain significance-status post esophageal biospy. small hiatal hernia.    ESOPHAGOGASTRODUODENOSCOPY (EGD) WITH PROPOFOL N/A 05/20/2015   RMR: NORMAL EGD status post maloney dilation    ESOPHAGOGASTRODUODENOSCOPY (EGD) WITH PROPOFOL N/A 12/23/2018   Dr. Gala Romney: normal esophagus, s/p esophageal dilation.    FOOT ARTHRODESIS Right 03/31/2022   Procedure: RIGHT FOOT TARSAL-METATARSAL FUSION;  Surgeon: Newt Minion, MD;  Location: [place];  Service: Orthopedics;  Laterality: Right;   FOOT SURGERY     left foot-   GASTROCNEMIUS RECESSION Right 03/31/2022  Procedure: RIGHT ACHILLES LENGTHENING;  Surgeon: Nadara Mustard, MD;  Location: [facility];  Service: Orthopedics;  Laterality: Right;   INCISION AND DRAINAGE Right 01/03/2013   Procedure: INCISION AND DRAINAGE;  Surgeon: Vickki Hearing, MD;  Location: [facility];  Service: Orthopedics;  Laterality: Right;   KNEE ARTHROSCOPY  01/2012   right   KNEE ARTHROSCOPY WITH LATERAL RELEASE Right 01/03/2013   Procedure: Lateral Release Patella Right Knee;  Surgeon: Vickki Hearing, MD;  Location: [facility];  Service: Orthopedics;  Laterality:  Right;   LAPAROSCOPIC LYSIS OF ADHESIONS N/A 11/12/2019   Procedure: LAPAROSCOPIC LYSIS OF ADHESIONS, extensive; drainage of peritoneal cyst;  Surgeon: Lazaro Arms, MD;  Location: [facility];  Service: Gynecology;  Laterality: N/A;   LUMBAR DISC SURGERY  L4-5   MALONEY DILATION N/A 05/20/2015   Procedure: Elease Hashimoto DILATION;  Surgeon: Corbin Ade, MD;  Location: [facility];  Service: Endoscopy;  Laterality: N/AElease Hashimoto dilator # 54   MALONEY DILATION N/A 12/23/2018   Procedure: Elease Hashimoto DILATION;  Surgeon: Corbin Ade, MD;  [facility];  Service: Endoscopy;  Laterality: N/A;   PARTIAL HYSTERECTOMY     TOTAL KNEE ARTHROPLASTY  07/01/2012   Procedure: TOTAL KNEE ARTHROPLASTY;  Surgeon: Vickki Hearing, MD;  Location: [facility];  Service: Orthopedics;  Laterality: Right;   TOTAL KNEE REVISION Right 01/03/2013   Procedure: Patellaplasty Right Knee;  Surgeon: Vickki Hearing, MD;  Location: [facility];  Service: Orthopedics;  Laterality: Right;   Patient Active Problem List   Diagnosis Date Noted   Primary osteoarthritis of right foot    Syncope 02/18/2020   Prolonged QT interval 02/18/2020   Glucose intolerance 02/18/2020   Normocytic anemia 12/10/2019   Poor appetite 09/24/2019   Abnormal weight loss 09/24/2019   Upper abdominal mass 09/24/2019   Dysphagia 01/30/2019   Simple adnexal cyst greater than 1 cm in diameter in postmenopausal patient 11/08/2018   Abdominal pain 08/09/2015   Dysphagia, pharyngoesophageal phase    H/O adenomatous polyp of colon 05/05/2015   Constipation 02/02/2015   Knee contusion 12/09/2013   Esophageal dysphagia 07/02/2013   Abdominal pain, epigastric 07/02/2013   Chronic pain syndrome 11/28/2012   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   Cellulitis 11/04/2012   Postoperative wound breakdown 11/04/2012   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   Knee pain 08/13/2012   Hypokalemia 08/01/2012   Cellulitis, wound, post-operative 07/30/2012    Degenerative tear of medial meniscus of right knee 01/12/2012   OA (osteoarthritis) of knee 01/12/2012   Right knee sprain 11/02/2011   Instability of knee joint 03/29/2011   URTICARIA 12/21/2009   OTHER URINARY INCONTINENCE 07/18/2009   FOOT PAIN, LEFT 05/26/2009   DERMATITIS, CHRONIC 10/21/2008   IMPAIRED GLUCOSE TOLERANCE 09/09/2008   Chronic back pain 09/09/2008   Hypothyroidism 02/27/2008   Hyperlipidemia 02/27/2008   Anxiety with depression 02/27/2008   Bipolar I disorder, most recent episode (or current) depressed, unspecified 02/27/2008   HYPERTENSION 02/27/2008   ALLERGIC RHINITIS 02/27/2008   GERD 02/27/2008   HEADACHE 02/27/2008    PCP: Elfredia Nevins MD  REFERRING PROVIDER: Nadara Mustard, MD   REFERRING DIAG: [phone] (ICD-10-CM) - Sprain of tarsometatarsal ligament of right foot, subsequent encounter   THERAPY DIAG:  Pain in right ankle and joints of right foot  Difficulty in walking, not elsewhere classified  Rationale for Evaluation and Treatment Rehabilitation  ONSET DATE: 03/31/22  SUBJECTIVE:   SUBJECTIVE STATEMENT: Patient says she  is "doing". Saw Dr Lajoyce Cornersuda and everything looks good. She says she twisted her foot somehow getting out the car.    Eval: Patient presents with RT foot and ankle pain s/p RT metatarsal fusion on 03/31/20. She was having trouble for several months and they thought it was a sprain. She wore boot for 6 weeks but did not get any better. She was sent for surgery. She is still having trouble walking, still having pain. She says her sx are no better since surgery. She has not had formal therapy for this. She is still taking pain medication and she is walking with a SPC.   PERTINENT HISTORY: Arthritis, HTN, anxiety  PAIN:  Are you having pain? Yes: NPRS scale: 9/10 Pain location: RT dorsal foot down to great toe  Pain description: throbbing, stinging Aggravating factors: standing, walking Relieving factors: meds, soaking in  epsom salt   PRECAUTIONS: None  WEIGHT BEARING RESTRICTIONS No  FALLS:  Has patient fallen in last 6 months? Yes. Number of falls 2  LIVING ENVIRONMENT: Lives with: lives with their partner Lives in: House/apartment Stairs: No Has following equipment at home: Single point cane and Environmental consultantWalker - 2 wheeled  OCCUPATION: Retired   PLOF: Independent with basic ADLs  PATIENT GOALS Decrease pain and swelling    OBJECTIVE:   DIAGNOSTIC FINDINGS: NA  PATIENT SURVEYS:  FOTO 45% function  COGNITION:  Overall cognitive status: Within functional limits for tasks assessed     SENSATION: Reports numbness/ tingling in RT foot   EDEMA:  Min/ mod edema in RT foot   PALPATION: Hyper sensitive about incision and top of RT foot  LOWER EXTREMITY ROM:  Active ROM Right eval Left eval  Hip flexion    Hip extension    Hip abduction    Hip adduction    Hip internal rotation    Hip external rotation    Knee flexion    Knee extension    Ankle dorsiflexion -6   Ankle plantarflexion 10   Ankle inversion    Ankle eversion     (Blank rows = not tested)  LOWER EXTREMITY MMT:  MMT Right eval Left eval  Hip flexion    Hip extension    Hip abduction    Hip adduction    Hip internal rotation    Hip external rotation    Knee flexion    Knee extension    Ankle dorsiflexion 3-   Ankle plantarflexion    Ankle inversion 3-   Ankle eversion 3-    (Blank rows = not tested)    FUNCTIONAL TESTS:  2 minute walk test: 100 feet  GAIT: Distance walked: 100 feet  Assistive device utilized: Single point cane Level of assistance: Modified independence Comments: decreased stride, antalgic, decreased stance on RT    TODAY'S TREATMENT: 07/04/22 Ankle circle x20 CW/ CCW  Seated calf stretch 3 x 30"  Ankle band 4 way (inv/ev/df/ pf) YTB x20 each  Towel crunch 2 min    Manual STM and scar tissue massage Manual Ankle PROM all planes and great toe flexion/  extension  06/29/22 Review of HEP and goals Seated  Ankle circles CW and CCW x 1' each Heel slides on 4" step x 2' for dorsiflexion and knee flexion Heel/toe raises on 4" step x 1' Towel crunches x 1'  Towel inversion/eversion x 1' Dorsiflexion stretch with strap 5 x 20" Manual isometrics inv/ev/plantar flexion 5" x 10 each Rocker board F/B and S/S x 1' each  STM to Right foot to decrease pain and improve tissue mobility x 8' manual dorsiflexion stretch 20" x 5; no other treatment performed during manual treatment  Eval Ankle circle Manual RT ankle and great toe PROM flexion/ extension    PATIENT EDUCATION:  Education details: on eval findings, POC and HEP  Person educated: Patient Education method: Explanation Education comprehension: verbalized understanding   HOME EXERCISE PROGRAM: Access Code: [email or web address] URL: [email or web address] 07/04/22 - Seated Ankle Plantar Flexion with Resistance Loop  - 2 x daily - 7 x weekly - 2 sets - 10 reps - Seated Ankle Inversion with Resistance  - 2 x daily - 7 x weekly - 2 sets - 10 reps - Seated Ankle Eversion with Resistance  - 2 x daily - 7 x weekly - 2 sets - 10 reps - Seated Ankle Dorsiflexion with Resistance  - 2 x daily - 7 x weekly - 2 sets - 10 reps  Date: 06/20/2022 Prepared by: Georges Lynch  Exercises - Towel Scrunches  - 3 x daily - 7 x weekly - 3 sets - 10 reps - Ankle Inversion Eversion Towel Slide  - 3 x daily - 7 x weekly - 3 sets - 10 reps - Seated Ankle Circles  - 3 x daily - 7 x weekly - 3 sets - 10 reps - Seated Calf Stretch with Strap  - 3 x daily - 7 x weekly - 1 sets - 4 reps - 30 second hold  ASSESSMENT:  CLINICAL IMPRESSION: Patient continues to demo significant ROM restriction in Rt ankle and great toe. Somewhat improved following manual therapy. Ongoing pain noted with most activity, though tolerated added band 4 way reasonably well so added to HEP for ankle strengthening. Patient will  continue to benefit from skilled therapy services to reduce remaining deficits and improve functional ability.   OBJECTIVE IMPAIRMENTS Abnormal gait, decreased activity tolerance, decreased balance, decreased endurance, decreased mobility, difficulty walking, decreased ROM, decreased strength, hypomobility, increased edema, increased fascial restrictions, impaired flexibility, impaired sensation, improper body mechanics, and pain.   ACTIVITY LIMITATIONS standing, squatting, stairs, transfers, and locomotion level  PARTICIPATION LIMITATIONS: meal prep, cleaning, laundry, driving, shopping, community activity, and yard work  PERSONAL FACTORS Past/current experiences and Time since onset of injury/illness/exacerbation are also affecting patient's functional outcome.   REHAB POTENTIAL: Good  CLINICAL DECISION MAKING: Stable/uncomplicated  EVALUATION COMPLEXITY: Low   GOALS: SHORT TERM GOALS: Target date: 07/18/2022  Patient will be independent with initial HEP and self-management strategies to improve functional outcomes Baseline:  Goal status: IN PROGRESS   2. Patient will have RT ankle DF AROM at least 5 degrees to improve functional mobility, gait mechanics and stair ambulation.  Baseline: -6 Goal status: INITIAL  LONG TERM GOALS: Target date: 08/15/2022  Patient will be independent with advanced HEP and self-management strategies to improve functional outcomes Baseline:  Goal status: IN PROGRESS  2.  Patient will improve FOTO score to predicted value to indicate improvement in functional outcomes Baseline: 45% Goal status: IN PROGRESS  3.  Patient will have RT ankle DF AROM at least 10 degrees to improve functional mobility, gait mechanics and stair ambulation.  Baseline: -6 Goal status: IN PROGRESS  4. Patient will have equal to or > 4/5 MMT throughout RT ankle to improve ability to perform functional mobility, stair ambulation and ADLs.  Baseline: See MMT Goal status: IN  PROGRESS  5. Patient will be able to ambulate at least 300 feet during with LRAD to demonstrate improved  ability to perform functional mobility and associated tasks. Baseline: 100 feet with SPC Goal status: IN PROGRESS   PLAN: PT FREQUENCY: 2x/week  PT DURATION: 8 weeks  PLANNED INTERVENTIONS: Therapeutic exercises, Therapeutic activity, Neuromuscular re-education, Balance training, Gait training, Patient/Family education, Joint manipulation, Joint mobilization, Stair training, Aquatic Therapy, Dry Needling, Electrical stimulation, Spinal manipulation, Spinal mobilization, Cryotherapy, Moist heat, scar mobilization, Taping, Traction, Ultrasound, Biofeedback, Ionotophoresis 4mg /ml Dexamethasone, and Manual therapy.   PLAN FOR NEXT SESSION: Manual Rt ankle PROM and scar tissue massage. Progress ankle strength and mobility as tolerated. Isometrics, BAPs, stretching   9:00 AM, 07/04/22 07/06/22 PT DPT  Physical Therapist with [facility]  [phone]

## 2022-07-05 ENCOUNTER — Ambulatory Visit: Payer: Self-pay | Admitting: *Deleted

## 2022-07-05 NOTE — Patient Instructions (Signed)
Visit Information  Thank you for taking time to visit with me today. Please don't hesitate to contact me if I can be of assistance to you before our next scheduled telephone appointment.  Following are the goals we discussed today:  Continue outpatient therapy as scheduled  Our next appointment is by telephone on 11/29  Please call the care guide team at [phone] if you need to cancel or reschedule your appointment.   Please call the Suicide and Crisis Lifeline: 988 call the [place] National Suicide Prevention Lifeline: 825-284-7318 or TTY: 940-259-1578 TTY 417-455-8514) to talk to a trained counselor call 1-800-273-TALK (toll free, 24 hour hotline) call the [facility]: [phone]) [phone] call 911 if you are experiencing a Mental Health or [place] or need someone to talk to.  Patient verbalizes understanding of instructions and care plan provided today and agrees to view in [place]. Active MyChart status and patient understanding of how to access instructions and care plan via MyChart confirmed with patient.     The patient has been provided with contact information for the care management team and has been advised to call with any health related questions or concerns.   Valente David, RN, MSN, [place] Care Management Care Management Coordinator [phone]

## 2022-07-05 NOTE — Patient Outreach (Signed)
  Care Coordination   Follow Up Visit Note   07/05/2022 Name: Diane [name] MRN: [id number] DOB: [birth year]  Diane [name] is a 71 y.o. year old female who sees Redmond School, MD for primary care. I spoke with  Diane [name] by phone today.  What matters to the patients health and wellness today?  Continues to have some pain with movement, decreases when sitting still.  Now active with outpatient PT twice a week.      Goals Addressed               This Visit's Progress     Recover from foot surgery (pt-stated)   On track     Care Coordination Interventions: Reviewed provider established plan for pain management Discussed importance of adherence to all scheduled medical appointments Counseled on the importance of reporting any/all new or changed pain symptoms or management strategies to pain management provider Advised patient to discuss recommendations for ongoing surgical recovery with provider Appointment with Dr.Duda on 11/16 and 11/27 for follow up xrays          SDOH assessments and interventions completed:  No     Care Coordination Interventions Activated:  Yes  Care Coordination Interventions:  Yes, provided   Follow up plan: Follow up call scheduled for 11/29    Encounter Outcome:  Pt. Visit Completed   Valente David, RN, MSN, [place] Care Management Care Management Coordinator [phone]

## 2022-07-06 ENCOUNTER — Ambulatory Visit (HOSPITAL_COMMUNITY): Payer: Medicare Other

## 2022-07-06 DIAGNOSIS — M12571 Traumatic arthropathy, right ankle and foot: Secondary | ICD-10-CM | POA: Diagnosis not present

## 2022-07-06 DIAGNOSIS — R262 Difficulty in walking, not elsewhere classified: Secondary | ICD-10-CM

## 2022-07-06 DIAGNOSIS — M25571 Pain in right ankle and joints of right foot: Secondary | ICD-10-CM | POA: Diagnosis not present

## 2022-07-06 NOTE — Therapy (Signed)
OUTPATIENT PHYSICAL THERAPY LOWER EXTREMITY TREATMENT   Patient Name: Diane [name] MRN: [id number] DOB:1951-03-04, 71 y.o., female Today's Date: 07/06/2022   PT End of Session - 07/06/22 0901     Visit Number 4    Number of Visits 16    Date for PT Re-Evaluation 08/15/22    Authorization Type [employer]    Progress Note Due on Visit 10    PT Start Time 0900    PT Stop Time 0941    PT Time Calculation (min) 41 min    Activity Tolerance Patient tolerated treatment well;Patient limited by pain    Behavior During Therapy WFL for tasks assessed/performed               Past Medical History:  Diagnosis Date   Allergic rhinitis    Anxiety    Arthritis    Cataracts, bilateral    Chronic back pain    Sees pain [facility]   Chronic pain syndrome    Constipation    Depression    Essential hypertension    GERD (gastroesophageal reflux disease)    Headaches, cluster    History of seizures    Hypothyroid    Mania (HCC)    Mixed hyperlipidemia    PTSD (post-traumatic stress disorder)    Right lumbar radiculopathy    Sleep apnea    STOP BANG score=4   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[facility])    Past Surgical History:  Procedure Laterality Date   BACK SURGERY     BILATERAL OOPHORECTOMY     CATARACT EXTRACTION W/PHACO Left 01/04/2016   Procedure: CATARACT EXTRACTION PHACO AND INTRAOCULAR LENS PLACEMENT ([place]);  Surgeon: Rutherford Guys, MD;  Location: [facility];  Service: Ophthalmology;  Laterality: Left;  CDE:4.70   CATARACT EXTRACTION W/PHACO Right 01/18/2016   Procedure: CATARACT EXTRACTION PHACO AND INTRAOCULAR LENS PLACEMENT RIGHT EYE; CDE:  4.66;  Surgeon: Rutherford Guys, MD;  Location: [facility];  Service: Ophthalmology;  Laterality: Right;   CHOLECYSTECTOMY     COLONOSCOPY  10/16/2008   VZC:HYIFOYDXAJO, otherwise normal rectum; pancolonic diverticula the remainder of the colonic mucosa appeared normal. Next TCS due 10/2013.   COLONOSCOPY  06/28/2006   RMR: Internal  hemorrhoids. Diminutive rectal polyps, cold biopsied/ Pancolonic diverticula. Polyps in the right colon removed    COLONOSCOPY WITH PROPOFOL N/A 05/20/2015   RMR: Pancolonic diverticulosis. Redundant colon    COLONOSCOPY WITH PROPOFOL N/A 11/11/2020   Procedure: COLONOSCOPY WITH PROPOFOL;  Surgeon: Daneil Dolin, MD;  [facility];  Service: Endoscopy;  Laterality: N/A;  pm ASA 2   ESOPHAGOGASTRODUODENOSCOPY  10/16/2008   INO:MVEHM hiatal hernia otherwise normal esophagus, stomach  D1, D2, status post passage of a 56-French Maloney dilator   ESOPHAGOGASTRODUODENOSCOPY  06/28/2006   CNO:BSJGGE esophagus. Small hiatal hernia. Otherwise normal stomach, D1 and D2, status post passage of a 56 French Maloney dilator   ESOPHAGOGASTRODUODENOSCOPY (EGD) WITH ESOPHAGEAL DILATION N/A 07/14/2013   RMR: Abnormal esophagus of uncertain significance-status post esophageal biospy. small hiatal hernia.    ESOPHAGOGASTRODUODENOSCOPY (EGD) WITH PROPOFOL N/A 05/20/2015   RMR: NORMAL EGD status post maloney dilation    ESOPHAGOGASTRODUODENOSCOPY (EGD) WITH PROPOFOL N/A 12/23/2018   Dr. Gala Romney: normal esophagus, s/p esophageal dilation.    FOOT ARTHRODESIS Right 03/31/2022   Procedure: RIGHT FOOT TARSAL-METATARSAL FUSION;  Surgeon: Newt Minion, MD;  Location: [facility];  Service: Orthopedics;  Laterality: Right;   FOOT SURGERY     left foot-   GASTROCNEMIUS RECESSION Right 03/31/2022  Procedure: RIGHT ACHILLES LENGTHENING;  Surgeon: Nadara Mustard, MD;  Location: [facility];  Service: Orthopedics;  Laterality: Right;   INCISION AND DRAINAGE Right 01/03/2013   Procedure: INCISION AND DRAINAGE;  Surgeon: Vickki Hearing, MD;  Location: [facility];  Service: Orthopedics;  Laterality: Right;   KNEE ARTHROSCOPY  01/2012   right   KNEE ARTHROSCOPY WITH LATERAL RELEASE Right 01/03/2013   Procedure: Lateral Release Patella Right Knee;  Surgeon: Vickki Hearing, MD;  Location: [facility];  Service: Orthopedics;  Laterality:  Right;   LAPAROSCOPIC LYSIS OF ADHESIONS N/A 11/12/2019   Procedure: LAPAROSCOPIC LYSIS OF ADHESIONS, extensive; drainage of peritoneal cyst;  Surgeon: Lazaro Arms, MD;  Location: [facility];  Service: Gynecology;  Laterality: N/A;   LUMBAR DISC SURGERY  L4-5   MALONEY DILATION N/A 05/20/2015   Procedure: Elease Hashimoto DILATION;  Surgeon: Corbin Ade, MD;  Location: [facility];  Service: Endoscopy;  Laterality: N/AElease Hashimoto dilator # 54   MALONEY DILATION N/A 12/23/2018   Procedure: Elease Hashimoto DILATION;  Surgeon: Corbin Ade, MD;  [facility];  Service: Endoscopy;  Laterality: N/A;   PARTIAL HYSTERECTOMY     TOTAL KNEE ARTHROPLASTY  07/01/2012   Procedure: TOTAL KNEE ARTHROPLASTY;  Surgeon: Vickki Hearing, MD;  Location: [facility];  Service: Orthopedics;  Laterality: Right;   TOTAL KNEE REVISION Right 01/03/2013   Procedure: Patellaplasty Right Knee;  Surgeon: Vickki Hearing, MD;  Location: [facility];  Service: Orthopedics;  Laterality: Right;   Patient Active Problem List   Diagnosis Date Noted   Primary osteoarthritis of right foot    Syncope 02/18/2020   Prolonged QT interval 02/18/2020   Glucose intolerance 02/18/2020   Normocytic anemia 12/10/2019   Poor appetite 09/24/2019   Abnormal weight loss 09/24/2019   Upper abdominal mass 09/24/2019   Dysphagia 01/30/2019   Simple adnexal cyst greater than 1 cm in diameter in postmenopausal patient 11/08/2018   Abdominal pain 08/09/2015   Dysphagia, pharyngoesophageal phase    H/O adenomatous polyp of colon 05/05/2015   Constipation 02/02/2015   Knee contusion 12/09/2013   Esophageal dysphagia 07/02/2013   Abdominal pain, epigastric 07/02/2013   Chronic pain syndrome 11/28/2012   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   Cellulitis 11/04/2012   Postoperative wound breakdown 11/04/2012   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   Knee pain 08/13/2012   Hypokalemia 08/01/2012   Cellulitis, wound, post-operative 07/30/2012    Degenerative tear of medial meniscus of right knee 01/12/2012   OA (osteoarthritis) of knee 01/12/2012   Right knee sprain 11/02/2011   Instability of knee joint 03/29/2011   URTICARIA 12/21/2009   OTHER URINARY INCONTINENCE 07/18/2009   FOOT PAIN, LEFT 05/26/2009   DERMATITIS, CHRONIC 10/21/2008   IMPAIRED GLUCOSE TOLERANCE 09/09/2008   Chronic back pain 09/09/2008   Hypothyroidism 02/27/2008   Hyperlipidemia 02/27/2008   Anxiety with depression 02/27/2008   Bipolar I disorder, most recent episode (or current) depressed, unspecified 02/27/2008   HYPERTENSION 02/27/2008   ALLERGIC RHINITIS 02/27/2008   GERD 02/27/2008   HEADACHE 02/27/2008    PCP: Elfredia Nevins MD  REFERRING PROVIDER: Nadara Mustard, MD   REFERRING DIAG: [phone] (ICD-10-CM) - Sprain of tarsometatarsal ligament of right foot, subsequent encounter   THERAPY DIAG:  Pain in right ankle and joints of right foot  Difficulty in walking, not elsewhere classified  Rationale for Evaluation and Treatment Rehabilitation  ONSET DATE: 03/31/22  SUBJECTIVE:   SUBJECTIVE STATEMENT: Patient reports she  is a little better today; able to walk down to the therapy gym today with her cane.  Decreased pain to 6/10  Eval: Patient presents with RT foot and ankle pain s/p RT metatarsal fusion on 03/31/20. She was having trouble for several months and they thought it was a sprain. She wore boot for 6 weeks but did not get any better. She was sent for surgery. She is still having trouble walking, still having pain. She says her sx are no better since surgery. She has not had formal therapy for this. She is still taking pain medication and she is walking with a SPC.   PERTINENT HISTORY: Arthritis, HTN, anxiety  PAIN:  Are you having pain? Yes: NPRS scale: 6/10 Pain location: RT dorsal foot down to great toe  Pain description: throbbing, stinging Aggravating factors: standing, walking Relieving factors: meds, soaking in epsom  salt   PRECAUTIONS: None  WEIGHT BEARING RESTRICTIONS No  FALLS:  Has patient fallen in last 6 months? Yes. Number of falls 2  LIVING ENVIRONMENT: Lives with: lives with their partner Lives in: House/apartment Stairs: No Has following equipment at home: Single point cane and Environmental consultant - 2 wheeled  OCCUPATION: Retired   PLOF: Independent with basic ADLs  PATIENT GOALS Decrease pain and swelling    OBJECTIVE:   DIAGNOSTIC FINDINGS: NA  PATIENT SURVEYS:  FOTO 45% function  COGNITION:  Overall cognitive status: Within functional limits for tasks assessed     SENSATION: Reports numbness/ tingling in RT foot   EDEMA:  Min/ mod edema in RT foot   PALPATION: Hyper sensitive about incision and top of RT foot  LOWER EXTREMITY ROM:  Active ROM Right eval Left eval  Hip flexion    Hip extension    Hip abduction    Hip adduction    Hip internal rotation    Hip external rotation    Knee flexion    Knee extension    Ankle dorsiflexion -6   Ankle plantarflexion 10   Ankle inversion    Ankle eversion     (Blank rows = not tested)  LOWER EXTREMITY MMT:  MMT Right eval Left eval  Hip flexion    Hip extension    Hip abduction    Hip adduction    Hip internal rotation    Hip external rotation    Knee flexion    Knee extension    Ankle dorsiflexion 3-   Ankle plantarflexion    Ankle inversion 3-   Ankle eversion 3-    (Blank rows = not tested)    FUNCTIONAL TESTS:  2 minute walk test: 100 feet  GAIT: Distance walked: 100 feet  Assistive device utilized: Single point cane Level of assistance: Modified independence Comments: decreased stride, antalgic, decreased stance on RT    TODAY'S TREATMENT:  07/06/22 Ankle circles x 20 each way CW and CCW Seated heel slide for dorsiflexion on box x 2' Seated calf stretch with strap 5 x 20" 4 way yellow thera band x 10 each Towel crunches 2' Marble pick up attempt x 3' Rocker board F/B and S/S x 2'  each  STM and ankle PROM and great toe flexion/extension x 10' to decrease pain and increase tissue mobility; no other intervention performed during manual treatment   07/04/22 Ankle circle x20 CW/ CCW  Seated calf stretch 3 x 30"  Ankle band 4 way (inv/ev/df/ pf) YTB x20 each  Towel crunch 2 min    Manual STM and scar tissue  massage Manual Ankle PROM all planes and great toe flexion/ extension  06/29/22 Review of HEP and goals Seated  Ankle circles CW and CCW x 1' each Heel slides on 4" step x 2' for dorsiflexion and knee flexion Heel/toe raises on 4" step x 1' Towel crunches x 1'  Towel inversion/eversion x 1' Dorsiflexion stretch with strap 5 x 20" Manual isometrics inv/ev/plantar flexion 5" x 10 each Rocker board F/B and S/S x 1' each  STM to Right foot to decrease pain and improve tissue mobility x 8' manual dorsiflexion stretch 20" x 5; no other treatment performed during manual treatment  Eval Ankle circle Manual RT ankle and great toe PROM flexion/ extension    PATIENT EDUCATION:  Education details: on eval findings, POC and HEP  Person educated: Patient Education method: Explanation Education comprehension: verbalized understanding   HOME EXERCISE PROGRAM: Access Code: [email or web address] URL: [email or web address] 07/04/22 - Seated Ankle Plantar Flexion with Resistance Loop  - 2 x daily - 7 x weekly - 2 sets - 10 reps - Seated Ankle Inversion with Resistance  - 2 x daily - 7 x weekly - 2 sets - 10 reps - Seated Ankle Eversion with Resistance  - 2 x daily - 7 x weekly - 2 sets - 10 reps - Seated Ankle Dorsiflexion with Resistance  - 2 x daily - 7 x weekly - 2 sets - 10 reps  Date: 06/20/2022 Prepared by: Georges Lynchameron Caffaro  Exercises - Towel Scrunches  - 3 x daily - 7 x weekly - 3 sets - 10 reps - Ankle Inversion Eversion Towel Slide  - 3 x daily - 7 x weekly - 3 sets - 10 reps - Seated Ankle Circles  - 3 x daily - 7 x weekly - 3 sets - 10 reps -  Seated Calf Stretch with Strap  - 3 x daily - 7 x weekly - 1 sets - 4 reps - 30 second hold  ASSESSMENT:  CLINICAL IMPRESSION: Today's session continued to work on ankle mobility and gentle strengthening. Patient with decreased guarding with right ankle mobility.  Added marble pick ups; patient with great difficulty trying to pick up marbles but overall is moving with less pain complaint. Added rocker board to improve ankle mobility. Noted decreased swelling in the foot and ankle today. Patient will continue to benefit from skilled therapy services to reduce remaining deficits and improve functional ability.   OBJECTIVE IMPAIRMENTS Abnormal gait, decreased activity tolerance, decreased balance, decreased endurance, decreased mobility, difficulty walking, decreased ROM, decreased strength, hypomobility, increased edema, increased fascial restrictions, impaired flexibility, impaired sensation, improper body mechanics, and pain.   ACTIVITY LIMITATIONS standing, squatting, stairs, transfers, and locomotion level  PARTICIPATION LIMITATIONS: meal prep, cleaning, laundry, driving, shopping, community activity, and yard work  PERSONAL FACTORS Past/current experiences and Time since onset of injury/illness/exacerbation are also affecting patient's functional outcome.   REHAB POTENTIAL: Good  CLINICAL DECISION MAKING: Stable/uncomplicated  EVALUATION COMPLEXITY: Low   GOALS: SHORT TERM GOALS: Target date: 07/18/2022  Patient will be independent with initial HEP and self-management strategies to improve functional outcomes Baseline:  Goal status: IN PROGRESS   2. Patient will have RT ankle DF AROM at least 5 degrees to improve functional mobility, gait mechanics and stair ambulation.  Baseline: -6 Goal status: INITIAL  LONG TERM GOALS: Target date: 08/15/2022  Patient will be independent with advanced HEP and self-management strategies to improve functional outcomes Baseline:  Goal status: IN  PROGRESS  2.  Patient will improve FOTO  score to predicted value to indicate improvement in functional outcomes Baseline: 45% Goal status: IN PROGRESS  3.  Patient will have RT ankle DF AROM at least 10 degrees to improve functional mobility, gait mechanics and stair ambulation.  Baseline: -6 Goal status: IN PROGRESS  4. Patient will have equal to or > 4/5 MMT throughout RT ankle to improve ability to perform functional mobility, stair ambulation and ADLs.  Baseline: See MMT Goal status: IN PROGRESS  5. Patient will be able to ambulate at least 300 feet during with LRAD to demonstrate improved ability to perform functional mobility and associated tasks. Baseline: 100 feet with SPC Goal status: IN PROGRESS   PLAN: PT FREQUENCY: 2x/week  PT DURATION: 8 weeks  PLANNED INTERVENTIONS: Therapeutic exercises, Therapeutic activity, Neuromuscular re-education, Balance training, Gait training, Patient/Family education, Joint manipulation, Joint mobilization, Stair training, Aquatic Therapy, Dry Needling, Electrical stimulation, Spinal manipulation, Spinal mobilization, Cryotherapy, Moist heat, scar mobilization, Taping, Traction, Ultrasound, Biofeedback, Ionotophoresis 4mg /ml Dexamethasone, and Manual therapy.   PLAN FOR NEXT SESSION: Manual Rt ankle PROM and scar tissue massage. Progress ankle strength and mobility as tolerated. Isometrics, BAPs, stretching   9:58 AM, 07/06/22 Natlie Asfour [place] Diana MPT [facility] [place] [phone]

## 2022-07-10 ENCOUNTER — Ambulatory Visit (HOSPITAL_COMMUNITY): Payer: Medicare Other

## 2022-07-10 DIAGNOSIS — R262 Difficulty in walking, not elsewhere classified: Secondary | ICD-10-CM | POA: Diagnosis not present

## 2022-07-10 DIAGNOSIS — M25571 Pain in right ankle and joints of right foot: Secondary | ICD-10-CM

## 2022-07-10 DIAGNOSIS — M12571 Traumatic arthropathy, right ankle and foot: Secondary | ICD-10-CM | POA: Diagnosis not present

## 2022-07-10 NOTE — Therapy (Signed)
OUTPATIENT PHYSICAL THERAPY LOWER EXTREMITY TREATMENT   Patient Name: VIDHI DELELLIS MRN: [id number] DOB:11-23-1950, 71 y.o., female Today's Date: 07/10/2022   PT End of Session - 07/10/22 1512     Visit Number 5    Number of Visits 16    Date for PT Re-Evaluation 08/15/22    Authorization Type [employer]    Progress Note Due on Visit 10    PT Start Time 1435    PT Stop Time 1515    PT Time Calculation (min) 40 min    Activity Tolerance Patient tolerated treatment well;Patient limited by pain    Behavior During Therapy WFL for tasks assessed/performed                Past Medical History:  Diagnosis Date   Allergic rhinitis    Anxiety    Arthritis    Cataracts, bilateral    Chronic back pain    Sees [facility]   Chronic pain syndrome    Constipation    Depression    Essential hypertension    GERD (gastroesophageal reflux disease)    Headaches, cluster    History of seizures    Hypothyroid    Mania (HCC)    Mixed hyperlipidemia    PTSD (post-traumatic stress disorder)    Right lumbar radiculopathy    Sleep apnea    STOP BANG score=4   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[facility])    Past Surgical History:  Procedure Laterality Date   BACK SURGERY     BILATERAL OOPHORECTOMY     CATARACT EXTRACTION W/PHACO Left 01/04/2016   Procedure: CATARACT EXTRACTION PHACO AND INTRAOCULAR LENS PLACEMENT (IOC);  Surgeon: Jethro Bolus, MD;  Location: [facility];  Service: Ophthalmology;  Laterality: Left;  CDE:4.70   CATARACT EXTRACTION W/PHACO Right 01/18/2016   Procedure: CATARACT EXTRACTION PHACO AND INTRAOCULAR LENS PLACEMENT RIGHT EYE; CDE:  4.66;  Surgeon: Jethro Bolus, MD;  Location: [facility];  Service: Ophthalmology;  Laterality: Right;   CHOLECYSTECTOMY     COLONOSCOPY  10/16/2008   KDX:[id number], otherwise normal rectum; pancolonic diverticula the remainder of the colonic mucosa appeared normal. Next TCS due 10/2013.   COLONOSCOPY  06/28/2006   RMR: Internal  hemorrhoids. Diminutive rectal polyps, cold biopsied/ Pancolonic diverticula. Polyps in the right colon removed    COLONOSCOPY WITH PROPOFOL N/A 05/20/2015   RMR: Pancolonic diverticulosis. Redundant colon    COLONOSCOPY WITH PROPOFOL N/A 11/11/2020   Procedure: COLONOSCOPY WITH PROPOFOL;  Surgeon: Corbin Ade, MD;  [facility]: [facility];  Service: Endoscopy;  Laterality: N/A;  pm ASA 2   ESOPHAGOGASTRODUODENOSCOPY  10/16/2008   QBH:ALPFX hiatal hernia otherwise normal esophagus, stomach  D1, D2, status post passage of a 56-French Maloney dilator   ESOPHAGOGASTRODUODENOSCOPY  06/28/2006   TKW:IOXBDZ esophagus. Small hiatal hernia. Otherwise normal stomach, D1 and D2, status post passage of a 56 French Maloney dilator   ESOPHAGOGASTRODUODENOSCOPY (EGD) WITH ESOPHAGEAL DILATION N/A 07/14/2013   RMR: Abnormal esophagus of uncertain significance-status post esophageal biospy. small hiatal hernia.    ESOPHAGOGASTRODUODENOSCOPY (EGD) WITH PROPOFOL N/A 05/20/2015   RMR: NORMAL EGD status post maloney dilation    ESOPHAGOGASTRODUODENOSCOPY (EGD) WITH PROPOFOL N/A 12/23/2018   Dr. Jena Gauss: normal esophagus, s/p esophageal dilation.    FOOT ARTHRODESIS Right 03/31/2022   Procedure: RIGHT FOOT TARSAL-METATARSAL FUSION;  Surgeon: Nadara Mustard, MD;  Location: [facility];  Service: Orthopedics;  Laterality: Right;   FOOT SURGERY     left foot-   GASTROCNEMIUS RECESSION Right  03/31/2022   Procedure: RIGHT ACHILLES LENGTHENING;  Surgeon: Newt Minion, MD;  Location: [place];  Service: Orthopedics;  Laterality: Right;   INCISION AND DRAINAGE Right 01/03/2013   Procedure: INCISION AND DRAINAGE;  Surgeon: Carole Civil, MD;  Location: [facility];  Service: Orthopedics;  Laterality: Right;   KNEE ARTHROSCOPY  01/2012   right   KNEE ARTHROSCOPY WITH LATERAL RELEASE Right 01/03/2013   Procedure: Lateral Release Patella Right Knee;  Surgeon: Carole Civil, MD;  Location: [facility];  Service: Orthopedics;  Laterality:  Right;   LAPAROSCOPIC LYSIS OF ADHESIONS N/A 11/12/2019   Procedure: LAPAROSCOPIC LYSIS OF ADHESIONS, extensive; drainage of peritoneal cyst;  Surgeon: Florian Buff, MD;  Location: [facility];  Service: Gynecology;  Laterality: N/A;   LUMBAR DISC SURGERY  L4-5   MALONEY DILATION N/A 05/20/2015   Procedure: Venia Minks DILATION;  Surgeon: Daneil Dolin, MD;  Location: [facility];  Service: Endoscopy;  Laterality: N/AVenia Minks dilator # 61   MALONEY DILATION N/A 12/23/2018   Procedure: Venia Minks DILATION;  Surgeon: Daneil Dolin, MD;  [facility];  Service: Endoscopy;  Laterality: N/A;   PARTIAL HYSTERECTOMY     TOTAL KNEE ARTHROPLASTY  07/01/2012   Procedure: TOTAL KNEE ARTHROPLASTY;  Surgeon: Carole Civil, MD;  Location: [facility];  Service: Orthopedics;  Laterality: Right;   TOTAL KNEE REVISION Right 01/03/2013   Procedure: Patellaplasty Right Knee;  Surgeon: Carole Civil, MD;  Location: [facility];  Service: Orthopedics;  Laterality: Right;   Patient Active Problem List   Diagnosis Date Noted   Primary osteoarthritis of right foot    Syncope 02/18/2020   Prolonged QT interval 02/18/2020   Glucose intolerance 02/18/2020   Normocytic anemia 12/10/2019   Poor appetite 09/24/2019   Abnormal weight loss 09/24/2019   Upper abdominal mass 09/24/2019   Dysphagia 01/30/2019   Simple adnexal cyst greater than 1 cm in diameter in postmenopausal patient 11/08/2018   Abdominal pain 08/09/2015   Dysphagia, pharyngoesophageal phase    H/O adenomatous polyp of colon 05/05/2015   Constipation 02/02/2015   Knee contusion 12/09/2013   Esophageal dysphagia 07/02/2013   Abdominal pain, epigastric 07/02/2013   Chronic pain syndrome 11/28/2012   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   Cellulitis 11/04/2012   Postoperative wound breakdown 11/04/2012   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   Knee pain 08/13/2012   Hypokalemia 08/01/2012   Cellulitis, wound, post-operative 07/30/2012    Degenerative tear of medial meniscus of right knee 01/12/2012   OA (osteoarthritis) of knee 01/12/2012   Right knee sprain 11/02/2011   Instability of knee joint 03/29/2011   URTICARIA 12/21/2009   OTHER URINARY INCONTINENCE 07/18/2009   FOOT PAIN, LEFT 05/26/2009   DERMATITIS, CHRONIC 10/21/2008   IMPAIRED GLUCOSE TOLERANCE 09/09/2008   Chronic back pain 09/09/2008   Hypothyroidism 02/27/2008   Hyperlipidemia 02/27/2008   Anxiety with depression 02/27/2008   Bipolar I disorder, most recent episode (or current) depressed, unspecified 02/27/2008   HYPERTENSION 02/27/2008   ALLERGIC RHINITIS 02/27/2008   GERD 02/27/2008   HEADACHE 02/27/2008    PCP: Redmond School MD  REFERRING PROVIDER: Newt Minion, MD   REFERRING DIAG: [phone] (ICD-10-CM) - Sprain of tarsometatarsal ligament of right foot, subsequent encounter   THERAPY DIAG:  Pain in right ankle and joints of right foot  Difficulty in walking, not elsewhere classified  Rationale for Evaluation and Treatment Rehabilitation  ONSET DATE: 03/31/22  SUBJECTIVE:   SUBJECTIVE STATEMENT:  Patient reports she is getting better; 5/10 pain today; swelling comes and goes  Eval: Patient presents with RT foot and ankle pain s/p RT metatarsal fusion on 03/31/20. She was having trouble for several months and they thought it was a sprain. She wore boot for 6 weeks but did not get any better. She was sent for surgery. She is still having trouble walking, still having pain. She says her sx are no better since surgery. She has not had formal therapy for this. She is still taking pain medication and she is walking with a SPC.   PERTINENT HISTORY: Arthritis, HTN, anxiety  PAIN:  Are you having pain? Yes: NPRS scale: 5/10 Pain location: RT dorsal foot down to great toe  Pain description: throbbing, stinging Aggravating factors: standing, walking Relieving factors: meds, soaking in epsom salt   PRECAUTIONS: None  WEIGHT BEARING  RESTRICTIONS No  FALLS:  Has patient fallen in last 6 months? Yes. Number of falls 2  LIVING ENVIRONMENT: Lives with: lives with their partner Lives in: House/apartment Stairs: No Has following equipment at home: Single point cane and Environmental consultant - 2 wheeled  OCCUPATION: Retired   PLOF: Independent with basic ADLs  PATIENT GOALS Decrease pain and swelling    OBJECTIVE:   DIAGNOSTIC FINDINGS: NA  PATIENT SURVEYS:  FOTO 45% function  COGNITION:  Overall cognitive status: Within functional limits for tasks assessed     SENSATION: Reports numbness/ tingling in RT foot   EDEMA:  Min/ mod edema in RT foot   PALPATION: Hyper sensitive about incision and top of RT foot  LOWER EXTREMITY ROM:  Active ROM Right eval Left eval  Hip flexion    Hip extension    Hip abduction    Hip adduction    Hip internal rotation    Hip external rotation    Knee flexion    Knee extension    Ankle dorsiflexion -6   Ankle plantarflexion 10   Ankle inversion    Ankle eversion     (Blank rows = not tested)  LOWER EXTREMITY MMT:  MMT Right eval Left eval  Hip flexion    Hip extension    Hip abduction    Hip adduction    Hip internal rotation    Hip external rotation    Knee flexion    Knee extension    Ankle dorsiflexion 3-   Ankle plantarflexion    Ankle inversion 3-   Ankle eversion 3-    (Blank rows = not tested)    FUNCTIONAL TESTS:  2 minute walk test: 100 feet  GAIT: Distance walked: 100 feet  Assistive device utilized: Single point cane Level of assistance: Modified independence Comments: decreased stride, antalgic, decreased stance on RT    TODAY'S TREATMENT: 07/10/22 Bolster roll f/B and S/S x 2 min each 4 way Tband yellow x 10 reps each Heel slides with towel on box x 2' for dorsiflexion Dorsiflexion stretch with strap 5 x 20" Marble pick up x 3' (able to pick up 1/2 the marbles today)  STM and ankle PROM and great toe flexion/extension x 10' to  decrease pain and increase tissue mobility; no other intervention performed during manual treatment    07/06/22 Ankle circles x 20 each way CW and CCW Seated heel slide for dorsiflexion on box x 2' Seated calf stretch with strap 5 x 20" 4 way yellow thera band x 10 each Towel crunches 2' Marble pick up attempt x 3' Rocker board F/B and S/S  x 2' each  STM and ankle PROM and great toe flexion/extension x 10' to decrease pain and increase tissue mobility; no other intervention performed during manual treatment   07/04/22 Ankle circle x20 CW/ CCW  Seated calf stretch 3 x 30"  Ankle band 4 way (inv/ev/df/ pf) YTB x20 each  Towel crunch 2 min    Manual STM and scar tissue massage Manual Ankle PROM all planes and great toe flexion/ extension  06/29/22 Review of HEP and goals Seated  Ankle circles CW and CCW x 1' each Heel slides on 4" step x 2' for dorsiflexion and knee flexion Heel/toe raises on 4" step x 1' Towel crunches x 1'  Towel inversion/eversion x 1' Dorsiflexion stretch with strap 5 x 20" Manual isometrics inv/ev/plantar flexion 5" x 10 each Rocker board F/B and S/S x 1' each  STM to Right foot to decrease pain and improve tissue mobility x 8' manual dorsiflexion stretch 20" x 5; no other treatment performed during manual treatment  Eval Ankle circle Manual RT ankle and great toe PROM flexion/ extension    PATIENT EDUCATION:  Education details: on eval findings, POC and HEP  Person educated: Patient Education method: Explanation Education comprehension: verbalized understanding   HOME EXERCISE PROGRAM: Access Code: [email or web address] URL: [email or web address] 07/04/22 - Seated Ankle Plantar Flexion with Resistance Loop  - 2 x daily - 7 x weekly - 2 sets - 10 reps - Seated Ankle Inversion with Resistance  - 2 x daily - 7 x weekly - 2 sets - 10 reps - Seated Ankle Eversion with Resistance  - 2 x daily - 7 x weekly - 2 sets - 10 reps - Seated Ankle  Dorsiflexion with Resistance  - 2 x daily - 7 x weekly - 2 sets - 10 reps  Date: 06/20/2022 Prepared by: Georges Jeronda Don  Exercises - Towel Scrunches  - 3 x daily - 7 x weekly - 3 sets - 10 reps - Ankle Inversion Eversion Towel Slide  - 3 x daily - 7 x weekly - 3 sets - 10 reps - Seated Ankle Circles  - 3 x daily - 7 x weekly - 3 sets - 10 reps - Seated Calf Stretch with Strap  - 3 x daily - 7 x weekly - 1 sets - 4 reps - 30 second hold  ASSESSMENT:  CLINICAL IMPRESSION: Today's session continued to work on ankle mobility and gentle strengthening. Patient with decreased guarding with right ankle mobility.  Added marble pick ups; patient with great difficulty trying to pick up marbles but overall is moving with less pain complaint. Added rocker board to improve ankle mobility. Noted decreased swelling in the foot and ankle today. Patient will continue to benefit from skilled therapy services to reduce remaining deficits and improve functional ability.   OBJECTIVE IMPAIRMENTS Abnormal gait, decreased activity tolerance, decreased balance, decreased endurance, decreased mobility, difficulty walking, decreased ROM, decreased strength, hypomobility, increased edema, increased fascial restrictions, impaired flexibility, impaired sensation, improper body mechanics, and pain.   ACTIVITY LIMITATIONS standing, squatting, stairs, transfers, and locomotion level  PARTICIPATION LIMITATIONS: meal prep, cleaning, laundry, driving, shopping, community activity, and yard work  PERSONAL FACTORS Past/current experiences and Time since onset of injury/illness/exacerbation are also affecting patient's functional outcome.   REHAB POTENTIAL: Good  CLINICAL DECISION MAKING: Stable/uncomplicated  EVALUATION COMPLEXITY: Low   GOALS: SHORT TERM GOALS: Target date: 07/18/2022  Patient will be independent with initial HEP and self-management strategies to improve functional outcomes Baseline:  Goal status: IN  PROGRESS   2. Patient will have RT ankle DF AROM at least 5 degrees to improve functional mobility, gait mechanics and stair ambulation.  Baseline: -6 Goal status: INITIAL  LONG TERM GOALS: Target date: 08/15/2022  Patient will be independent with advanced HEP and self-management strategies to improve functional outcomes Baseline:  Goal status: IN PROGRESS  2.  Patient will improve FOTO score to predicted value to indicate improvement in functional outcomes Baseline: 45% Goal status: IN PROGRESS  3.  Patient will have RT ankle DF AROM at least 10 degrees to improve functional mobility, gait mechanics and stair ambulation.  Baseline: -6 Goal status: IN PROGRESS  4. Patient will have equal to or > 4/5 MMT throughout RT ankle to improve ability to perform functional mobility, stair ambulation and ADLs.  Baseline: See MMT Goal status: IN PROGRESS  5. Patient will be able to ambulate at least 300 feet during 2MWT with LRAD to demonstrate improved ability to perform functional mobility and associated tasks. Baseline: 100 feet with SPC Goal status: IN PROGRESS   PLAN: PT FREQUENCY: 2x/week  PT DURATION: 8 weeks  PLANNED INTERVENTIONS: Therapeutic exercises, Therapeutic activity, Neuromuscular re-education, Balance training, Gait training, Patient/Family education, Joint manipulation, Joint mobilization, Stair training, Aquatic Therapy, Dry Needling, Electrical stimulation, Spinal manipulation, Spinal mobilization, Cryotherapy, Moist heat, scar mobilization, Taping, Traction, Ultrasound, Biofeedback, Ionotophoresis 4mg /ml Dexamethasone, and Manual therapy.   PLAN FOR NEXT SESSION: Manual Rt ankle PROM and scar tissue massage. Progress ankle strength and mobility as tolerated. Isometrics, BAPs, stretching   3:34 PM, 07/10/22 Alyla Pietila Small Retha Bither MPT [facility] [place] [phone] Ph:[phone]

## 2022-07-12 ENCOUNTER — Encounter (HOSPITAL_COMMUNITY): Payer: Medicare Other | Admitting: Physical Therapy

## 2022-07-17 ENCOUNTER — Other Ambulatory Visit: Payer: Self-pay | Admitting: Orthopedic Surgery

## 2022-07-17 ENCOUNTER — Encounter (HOSPITAL_COMMUNITY): Payer: Medicare Other | Admitting: Physical Therapy

## 2022-07-19 ENCOUNTER — Encounter (HOSPITAL_COMMUNITY): Payer: Self-pay

## 2022-07-19 ENCOUNTER — Ambulatory Visit (HOSPITAL_COMMUNITY): Payer: Medicare Other | Attending: Orthopedic Surgery

## 2022-07-19 DIAGNOSIS — F32A Depression, unspecified: Secondary | ICD-10-CM | POA: Diagnosis not present

## 2022-07-19 DIAGNOSIS — Z79899 Other long term (current) drug therapy: Secondary | ICD-10-CM | POA: Insufficient documentation

## 2022-07-19 DIAGNOSIS — R4781 Slurred speech: Secondary | ICD-10-CM | POA: Diagnosis not present

## 2022-07-19 DIAGNOSIS — R531 Weakness: Secondary | ICD-10-CM | POA: Diagnosis not present

## 2022-07-19 DIAGNOSIS — R262 Difficulty in walking, not elsewhere classified: Secondary | ICD-10-CM | POA: Insufficient documentation

## 2022-07-19 DIAGNOSIS — E860 Dehydration: Secondary | ICD-10-CM | POA: Insufficient documentation

## 2022-07-19 DIAGNOSIS — F419 Anxiety disorder, unspecified: Secondary | ICD-10-CM | POA: Insufficient documentation

## 2022-07-19 DIAGNOSIS — G9341 Metabolic encephalopathy: Secondary | ICD-10-CM | POA: Insufficient documentation

## 2022-07-19 DIAGNOSIS — R569 Unspecified convulsions: Secondary | ICD-10-CM | POA: Insufficient documentation

## 2022-07-19 DIAGNOSIS — Z0389 Encounter for observation for other suspected diseases and conditions ruled out: Secondary | ICD-10-CM | POA: Diagnosis not present

## 2022-07-19 DIAGNOSIS — R4182 Altered mental status, unspecified: Secondary | ICD-10-CM | POA: Insufficient documentation

## 2022-07-19 DIAGNOSIS — R251 Tremor, unspecified: Secondary | ICD-10-CM | POA: Insufficient documentation

## 2022-07-19 DIAGNOSIS — M25571 Pain in right ankle and joints of right foot: Secondary | ICD-10-CM | POA: Diagnosis not present

## 2022-07-19 DIAGNOSIS — B961 Klebsiella pneumoniae [K. pneumoniae] as the cause of diseases classified elsewhere: Secondary | ICD-10-CM | POA: Insufficient documentation

## 2022-07-19 DIAGNOSIS — G928 Other toxic encephalopathy: Secondary | ICD-10-CM | POA: Diagnosis not present

## 2022-07-19 DIAGNOSIS — R42 Dizziness and giddiness: Secondary | ICD-10-CM | POA: Diagnosis not present

## 2022-07-19 DIAGNOSIS — N390 Urinary tract infection, site not specified: Secondary | ICD-10-CM | POA: Diagnosis not present

## 2022-07-19 DIAGNOSIS — J811 Chronic pulmonary edema: Secondary | ICD-10-CM | POA: Diagnosis not present

## 2022-07-19 DIAGNOSIS — B9620 Unspecified Escherichia coli [E. coli] as the cause of diseases classified elsewhere: Secondary | ICD-10-CM | POA: Diagnosis not present

## 2022-07-19 NOTE — Therapy (Signed)
OUTPATIENT PHYSICAL THERAPY LOWER EXTREMITY TREATMENT   Patient Name: Diane [name] MRN: [id number] DOB:04/19/1951, 71 y.o., female Today's Date: 07/19/2022   PT End of Session - 07/19/22 1258     Visit Number 6    Number of Visits 16    Date for PT Re-Evaluation 08/15/22    Authorization Type [employer]    Progress Note Due on Visit 10    PT Start Time 1300    PT Stop Time 1342    PT Time Calculation (min) 42 min    Activity Tolerance Patient limited by pain    Behavior During Therapy WFL for tasks assessed/performed                Past Medical History:  Diagnosis Date   Allergic rhinitis    Anxiety    Arthritis    Cataracts, bilateral    Chronic back pain    Sees pain [facility]   Chronic pain syndrome    Constipation    Depression    Essential hypertension    GERD (gastroesophageal reflux disease)    Headaches, cluster    History of seizures    Hypothyroid    Mania (HCC)    Mixed hyperlipidemia    PTSD (post-traumatic stress disorder)    Right lumbar radiculopathy    Sleep apnea    STOP BANG score=4   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[facility])    Past Surgical History:  Procedure Laterality Date   BACK SURGERY     BILATERAL OOPHORECTOMY     CATARACT EXTRACTION W/PHACO Left 01/04/2016   Procedure: CATARACT EXTRACTION PHACO AND INTRAOCULAR LENS PLACEMENT (IOC);  Surgeon: Jethro Bolus, MD;  Location: [facility];  Service: Ophthalmology;  Laterality: Left;  CDE:4.70   CATARACT EXTRACTION W/PHACO Right 01/18/2016   Procedure: CATARACT EXTRACTION PHACO AND INTRAOCULAR LENS PLACEMENT RIGHT EYE; CDE:  4.66;  Surgeon: Jethro Bolus, MD;  Location: [facility];  Service: Ophthalmology;  Laterality: Right;   CHOLECYSTECTOMY     COLONOSCOPY  10/16/2008   QVZ:[id number], otherwise normal rectum; pancolonic diverticula the remainder of the colonic mucosa appeared normal. Next TCS due 10/2013.   COLONOSCOPY  06/28/2006   RMR: Internal hemorrhoids. Diminutive rectal polyps,  cold biopsied/ Pancolonic diverticula. Polyps in the right colon removed    COLONOSCOPY WITH PROPOFOL N/A 05/20/2015   RMR: Pancolonic diverticulosis. Redundant colon    COLONOSCOPY WITH PROPOFOL N/A 11/11/2020   Procedure: COLONOSCOPY WITH PROPOFOL;  Surgeon: Corbin Ade, MD;  Location: [facility];  Service: Endoscopy;  Laterality: N/A;  pm ASA 2   ESOPHAGOGASTRODUODENOSCOPY  10/16/2008   RJJ:OACZY hiatal hernia otherwise normal esophagus, stomach  D1, D2, status post passage of a 56-French Maloney dilator   ESOPHAGOGASTRODUODENOSCOPY  06/28/2006   SAY:TKZSWF esophagus. Small hiatal hernia. Otherwise normal stomach, D1 and D2, status post passage of a 56 French Maloney dilator   ESOPHAGOGASTRODUODENOSCOPY (EGD) WITH ESOPHAGEAL DILATION N/A 07/14/2013   RMR: Abnormal esophagus of uncertain significance-status post esophageal biospy. small hiatal hernia.    ESOPHAGOGASTRODUODENOSCOPY (EGD) WITH PROPOFOL N/A 05/20/2015   RMR: NORMAL EGD status post maloney dilation    ESOPHAGOGASTRODUODENOSCOPY (EGD) WITH PROPOFOL N/A 12/23/2018   Dr. Jena Gauss: normal esophagus, s/p esophageal dilation.    FOOT ARTHRODESIS Right 03/31/2022   Procedure: RIGHT FOOT TARSAL-METATARSAL FUSION;  Surgeon: Nadara Mustard, MD;  Location: [facility];  Service: Orthopedics;  Laterality: Right;   FOOT SURGERY     left foot-   GASTROCNEMIUS RECESSION Right 03/31/2022  Procedure: RIGHT ACHILLES LENGTHENING;  Surgeon: Nadara Mustard, MD;  Location: [facility] OR;  Service: Orthopedics;  Laterality: Right;   INCISION AND DRAINAGE Right 01/03/2013   Procedure: INCISION AND DRAINAGE;  Surgeon: Vickki Hearing, MD;  Location: [facility];  Service: Orthopedics;  Laterality: Right;   KNEE ARTHROSCOPY  01/2012   right   KNEE ARTHROSCOPY WITH LATERAL RELEASE Right 01/03/2013   Procedure: Lateral Release Patella Right Knee;  Surgeon: Vickki Hearing, MD;  Location: [facility];  Service: Orthopedics;  Laterality: Right;   LAPAROSCOPIC LYSIS OF ADHESIONS  N/A 11/12/2019   Procedure: LAPAROSCOPIC LYSIS OF ADHESIONS, extensive; drainage of peritoneal cyst;  Surgeon: Lazaro Arms, MD;  Location: [facility];  Service: Gynecology;  Laterality: N/A;   LUMBAR DISC SURGERY  L4-5   MALONEY DILATION N/A 05/20/2015   Procedure: Elease Hashimoto DILATION;  Surgeon: Corbin Ade, MD;  Location: [facility];  Service: Endoscopy;  Laterality: N/AElease Hashimoto dilator # 54   MALONEY DILATION N/A 12/23/2018   Procedure: Elease Hashimoto DILATION;  Surgeon: Corbin Ade, MD;  [facility];  Service: Endoscopy;  Laterality: N/A;   PARTIAL HYSTERECTOMY     TOTAL KNEE ARTHROPLASTY  07/01/2012   Procedure: TOTAL KNEE ARTHROPLASTY;  Surgeon: Vickki Hearing, MD;  Location: [facility];  Service: Orthopedics;  Laterality: Right;   TOTAL KNEE REVISION Right 01/03/2013   Procedure: Patellaplasty Right Knee;  Surgeon: Vickki Hearing, MD;  Location: [facility];  Service: Orthopedics;  Laterality: Right;   Patient Active Problem List   Diagnosis Date Noted   Primary osteoarthritis of right foot    Syncope 02/18/2020   Prolonged QT interval 02/18/2020   Glucose intolerance 02/18/2020   Normocytic anemia 12/10/2019   Poor appetite 09/24/2019   Abnormal weight loss 09/24/2019   Upper abdominal mass 09/24/2019   Dysphagia 01/30/2019   Simple adnexal cyst greater than 1 cm in diameter in postmenopausal patient 11/08/2018   Abdominal pain 08/09/2015   Dysphagia, pharyngoesophageal phase    H/O adenomatous polyp of colon 05/05/2015   Constipation 02/02/2015   Knee contusion 12/09/2013   Esophageal dysphagia 07/02/2013   Abdominal pain, epigastric 07/02/2013   Chronic pain syndrome 11/28/2012   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   Cellulitis 11/04/2012   Postoperative wound breakdown 11/04/2012   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   Knee pain 08/13/2012   Hypokalemia 08/01/2012   Cellulitis, wound, post-operative 07/30/2012   Degenerative tear of medial meniscus  of right knee 01/12/2012   OA (osteoarthritis) of knee 01/12/2012   Right knee sprain 11/02/2011   Instability of knee joint 03/29/2011   URTICARIA 12/21/2009   OTHER URINARY INCONTINENCE 07/18/2009   FOOT PAIN, LEFT 05/26/2009   DERMATITIS, CHRONIC 10/21/2008   IMPAIRED GLUCOSE TOLERANCE 09/09/2008   Chronic back pain 09/09/2008   Hypothyroidism 02/27/2008   Hyperlipidemia 02/27/2008   Anxiety with depression 02/27/2008   Bipolar I disorder, most recent episode (or current) depressed, unspecified 02/27/2008   HYPERTENSION 02/27/2008   ALLERGIC RHINITIS 02/27/2008   GERD 02/27/2008   HEADACHE 02/27/2008    PCP: Elfredia Nevins MD  REFERRING PROVIDER: Nadara Mustard, MD  Next apt: 07/27/22  REFERRING DIAG: C12.751Z (ICD-10-CM) - Sprain of tarsometatarsal ligament of right foot, subsequent encounter   THERAPY DIAG:  Pain in right ankle and joints of right foot  Difficulty in walking, not elsewhere classified  Rationale for Evaluation and Treatment Rehabilitation  ONSET DATE: 03/31/22  SUBJECTIVE:   SUBJECTIVE STATEMENT:  Pt stated she has stinging pain between the 1st and 2nd toes increased pain when walking/standing.  Reports compliance with HEP daily.  Reports the swelling comes and goes.  Stated she has been on her feet more over weekend due to death in family, increased pain today.    Eval: Patient presents with RT foot and ankle pain s/p RT metatarsal fusion on 03/31/20. She was having trouble for several months and they thought it was a sprain. She wore boot for 6 weeks but did not get any better. She was sent for surgery. She is still having trouble walking, still having pain. She says her sx are no better since surgery. She has not had formal therapy for this. She is still taking pain medication and she is walking with a SPC.   PERTINENT HISTORY: Arthritis, HTN, anxiety  PAIN:  Are you having pain? Yes: NPRS scale: 7/10 Pain location: RT dorsal foot down to great  toe  Pain description: throbbing, stinging Aggravating factors: standing, walking Relieving factors: meds, soaking in epsom salt   PRECAUTIONS: None  WEIGHT BEARING RESTRICTIONS No  FALLS:  Has patient fallen in last 6 months? Yes. Number of falls 2  LIVING ENVIRONMENT: Lives with: lives with their partner Lives in: House/apartment Stairs: No Has following equipment at home: Single point cane and Environmental consultant - 2 wheeled  OCCUPATION: Retired   PLOF: Independent with basic ADLs  PATIENT GOALS Decrease pain and swelling    OBJECTIVE:   DIAGNOSTIC FINDINGS: NA  PATIENT SURVEYS:  FOTO 45% function  COGNITION:  Overall cognitive status: Within functional limits for tasks assessed     SENSATION: Reports numbness/ tingling in RT foot   EDEMA:  Min/ mod edema in RT foot   PALPATION: Hyper sensitive about incision and top of RT foot  LOWER EXTREMITY ROM:  Active ROM Right eval Left eval  Hip flexion    Hip extension    Hip abduction    Hip adduction    Hip internal rotation    Hip external rotation    Knee flexion    Knee extension    Ankle dorsiflexion -6 4  Ankle plantarflexion 10 18  Ankle inversion  18  Ankle eversion  12   (Blank rows = not tested)  LOWER EXTREMITY MMT:  MMT Right eval Left eval Right 07/19/22  Hip flexion     Hip extension     Hip abduction     Hip adduction     Hip internal rotation     Hip external rotation     Knee flexion     Knee extension     Ankle dorsiflexion 3-  2+ *  Ankle plantarflexion     Ankle inversion 3-  3-*  Ankle eversion 3-  3-*   (Blank rows = not tested)  *=pain     FUNCTIONAL TESTS:  2 minute walk test: 100 feet  GAIT: Distance walked: 100 feet  Assistive device utilized: Single point cane Level of assistance: Modified independence Comments: decreased stride, antalgic, decreased stance on RT    TODAY'S TREATMENT: 07/19/22 FOTO 41.5% BAPS L2 5x each DF/PF; Inv/Ev; CW, increased pain with  CCW 55ft with SPC ROM  MMT  Manual STM, retrograde massage and ankle PROM x 10' Educated on benefits with compression garments for edema control, measurements taken and given handout for ETI.   Educated on RICE for pain and edema control.    07/10/22 Bolster roll f/B and S/S x 2 min each  4 way Tband yellow x 10 reps each Heel slides with towel on box x 2' for dorsiflexion Dorsiflexion stretch with strap 5 x 20" Marble pick up x 3' (able to pick up 1/2 the marbles today)  STM and ankle PROM and great toe flexion/extension x 10' to decrease pain and increase tissue mobility; no other intervention performed during manual treatment    07/06/22 Ankle circles x 20 each way CW and CCW Seated heel slide for dorsiflexion on box x 2' Seated calf stretch with strap 5 x 20" 4 way yellow thera band x 10 each Towel crunches 2' Marble pick up attempt x 3' Rocker board F/B and S/S x 2' each  STM and ankle PROM and great toe flexion/extension x 10' to decrease pain and increase tissue mobility; no other intervention performed during manual treatment   07/04/22 Ankle circle x20 CW/ CCW  Seated calf stretch 3 x 30"  Ankle band 4 way (inv/ev/df/ pf) YTB x20 each  Towel crunch 2 min    Manual STM and scar tissue massage Manual Ankle PROM all planes and great toe flexion/ extension  06/29/22 Review of HEP and goals Seated  Ankle circles CW and CCW x 1' each Heel slides on 4" step x 2' for dorsiflexion and knee flexion Heel/toe raises on 4" step x 1' Towel crunches x 1'  Towel inversion/eversion x 1' Dorsiflexion stretch with strap 5 x 20" Manual isometrics inv/ev/plantar flexion 5" x 10 each Rocker board F/B and S/S x 1' each  STM to Right foot to decrease pain and improve tissue mobility x 8' manual dorsiflexion stretch 20" x 5; no other treatment performed during manual treatment  Eval Ankle circle Manual RT ankle and great toe PROM flexion/ extension    PATIENT  EDUCATION:  Education details: on eval findings, POC and HEP  Person educated: Patient Education method: Explanation Education comprehension: verbalized understanding   HOME EXERCISE PROGRAM: Access Code: [email or web address] URL: [email or web address] 07/04/22 - Seated Ankle Plantar Flexion with Resistance Loop  - 2 x daily - 7 x weekly - 2 sets - 10 reps - Seated Ankle Inversion with Resistance  - 2 x daily - 7 x weekly - 2 sets - 10 reps - Seated Ankle Eversion with Resistance  - 2 x daily - 7 x weekly - 2 sets - 10 reps - Seated Ankle Dorsiflexion with Resistance  - 2 x daily - 7 x weekly - 2 sets - 10 reps  Date: 06/20/2022 Prepared by: Georges Lynch  Exercises - Towel Scrunches  - 3 x daily - 7 x weekly - 3 sets - 10 reps - Ankle Inversion Eversion Towel Slide  - 3 x daily - 7 x weekly - 3 sets - 10 reps - Seated Ankle Circles  - 3 x daily - 7 x weekly - 3 sets - 10 reps - Seated Calf Stretch with Strap  - 3 x daily - 7 x weekly - 1 sets - 4 reps - 30 second hold  ASSESSMENT:  CLINICAL IMPRESSION: Reviewed goals with the following findings (therapist thought reeval date was for 11/5 not 08/15/22):  Reports compliance with HEP daily.  Pt limited by pain this session with decreased cadence/distance noted during .  Decreased self perceived functional abilities noted with FOTO score.  Strength has not improved at all with MMT, feel pain may be related.  Does present with improved ankle ROM all directions.  Pt with MD apt next week, encouraged to follow up to determine  whether to continue or DC from PT.  Pt was educated on proper arm use with SPC to reduce pressure on foot during gait.  Educated benefits with compression garments for edema control, measurements taken and given handout for ETI.   OBJECTIVE IMPAIRMENTS Abnormal gait, decreased activity tolerance, decreased balance, decreased endurance, decreased mobility, difficulty walking, decreased ROM, decreased strength,  hypomobility, increased edema, increased fascial restrictions, impaired flexibility, impaired sensation, improper body mechanics, and pain.   ACTIVITY LIMITATIONS standing, squatting, stairs, transfers, and locomotion level  PARTICIPATION LIMITATIONS: meal prep, cleaning, laundry, driving, shopping, community activity, and yard work  PERSONAL FACTORS Past/current experiences and Time since onset of injury/illness/exacerbation are also affecting patient's functional outcome.   REHAB POTENTIAL: Good  CLINICAL DECISION MAKING: Stable/uncomplicated  EVALUATION COMPLEXITY: Low   GOALS: SHORT TERM GOALS: Target date: 07/18/2022  Patient will be independent with initial HEP and self-management strategies to improve functional outcomes Baseline: 07/19/22:  Reports compliance with HEP daily Goal status: IN PROGRESS   2. Patient will have RT ankle DF AROM at least 5 degrees to improve functional mobility, gait mechanics and stair ambulation.  Baseline: -6; 07/19/22: DF 4 degrees Goal status: IN PROGRESS   LONG TERM GOALS: Target date: 08/15/2022  Patient will be independent with advanced HEP and self-management strategies to improve functional outcomes Baseline: 07/19/22:  Reports compliance with HEP daily with initial exercises, no advanced exercises given. Goal status: IN PROGRESS  2.  Patient will improve FOTO score to predicted value to indicate improvement in functional outcomes Baseline: 45%; 07/19/22: FOTO score 41.5% Goal status: IN PROGRESS  3.  Patient will have RT ankle DF AROM at least 10 degrees to improve functional mobility, gait mechanics and stair ambulation.  Baseline: -6; 07/19/22: DF 4 degrees Goal status: IN PROGRESS  4. Patient will have equal to or > 4/5 MMT throughout RT ankle to improve ability to perform functional mobility, stair ambulation and ADLs.  Baseline: See MMT; 07/19/22: no improvements see above limited by pain Goal status: IN PROGRESS  5. Patient  will be able to ambulate at least 300 feet during 2MWT with LRAD to demonstrate improved ability to perform functional mobility and associated tasks. Baseline: 100 feet with SPC; 07/19/22: 2MWT with SPC 4080ft limited by pain Goal status: IN PROGRESS   PLAN: PT FREQUENCY: 2x/week  PT DURATION: 8 weeks  PLANNED INTERVENTIONS: Therapeutic exercises, Therapeutic activity, Neuromuscular re-education, Balance training, Gait training, Patient/Family education, Joint manipulation, Joint mobilization, Stair training, Aquatic Therapy, Dry Needling, Electrical stimulation, Spinal manipulation, Spinal mobilization, Cryotherapy, Moist heat, scar mobilization, Taping, Traction, Ultrasound, Biofeedback, Ionotophoresis 4mg /ml Dexamethasone, and Manual therapy.   PLAN FOR NEXT SESSION: Manual Rt ankle PROM and scar tissue massage. Progress ankle strength and mobility as tolerated. Isometrics, BAPs, stretching   Becky Saxasey Zyshonne Malecha, LPTA/CLT; CBIS [phone]  4:16 PM, 07/19/22

## 2022-07-25 ENCOUNTER — Encounter (HOSPITAL_COMMUNITY): Payer: Medicare Other

## 2022-07-27 ENCOUNTER — Ambulatory Visit: Payer: Medicare Other | Admitting: Orthopedic Surgery

## 2022-07-27 ENCOUNTER — Other Ambulatory Visit (HOSPITAL_COMMUNITY): Payer: Self-pay | Admitting: Psychiatry

## 2022-07-27 ENCOUNTER — Ambulatory Visit (INDEPENDENT_AMBULATORY_CARE_PROVIDER_SITE_OTHER): Payer: Medicare Other | Admitting: Orthopedic Surgery

## 2022-07-27 ENCOUNTER — Encounter (HOSPITAL_COMMUNITY): Payer: Medicare Other

## 2022-07-27 ENCOUNTER — Other Ambulatory Visit: Payer: Self-pay | Admitting: Orthopedic Surgery

## 2022-07-27 DIAGNOSIS — G894 Chronic pain syndrome: Secondary | ICD-10-CM

## 2022-07-27 DIAGNOSIS — M1711 Unilateral primary osteoarthritis, right knee: Secondary | ICD-10-CM

## 2022-07-27 DIAGNOSIS — M79671 Pain in right foot: Secondary | ICD-10-CM

## 2022-07-27 MED ORDER — OXYCODONE-ACETAMINOPHEN 5-325 MG PO TABS: 1.0000 | ORAL_TABLET | Freq: Three times a day (TID) | ORAL | 0 refills | Status: DC | PRN

## 2022-07-28 ENCOUNTER — Ambulatory Visit (HOSPITAL_COMMUNITY): Payer: Medicare Other

## 2022-07-28 DIAGNOSIS — R262 Difficulty in walking, not elsewhere classified: Secondary | ICD-10-CM

## 2022-07-28 DIAGNOSIS — M25571 Pain in right ankle and joints of right foot: Secondary | ICD-10-CM

## 2022-07-28 NOTE — Therapy (Signed)
OUTPATIENT PHYSICAL THERAPY LOWER EXTREMITY TREATMENT   Patient Name: Diane [name] MRN: [id number] DOB:10-Feb-1951, 71 y.o., female Today's Date: 07/28/2022   PT End of Session - 07/28/22 1112     Visit Number 7    Number of Visits 16    Date for PT Re-Evaluation 08/15/22    Authorization Type [employer]    Progress Note Due on Visit 10    PT Start Time 1031    PT Stop Time 1113    PT Time Calculation (min) 42 min    Activity Tolerance Patient limited by pain    Behavior During Therapy WFL for tasks assessed/performed                 Past Medical History:  Diagnosis Date   Allergic rhinitis    Anxiety    Arthritis    Cataracts, bilateral    Chronic back pain    Sees pain [facility] clinic   Chronic pain syndrome    Constipation    Depression    Essential hypertension    GERD (gastroesophageal reflux disease)    Headaches, cluster    History of seizures    Hypothyroid    Mania (HCC)    Mixed hyperlipidemia    PTSD (post-traumatic stress disorder)    Right lumbar radiculopathy    Sleep apnea    STOP BANG score=4   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[facility])    Past Surgical History:  Procedure Laterality Date   BACK SURGERY     BILATERAL OOPHORECTOMY     CATARACT EXTRACTION W/PHACO Left 01/04/2016   Procedure: CATARACT EXTRACTION PHACO AND INTRAOCULAR LENS PLACEMENT (IOC);  Surgeon: Jethro Bolus, MD;  Location: [facility];  Service: Ophthalmology;  Laterality: Left;  CDE:4.70   CATARACT EXTRACTION W/PHACO Right 01/18/2016   Procedure: CATARACT EXTRACTION PHACO AND INTRAOCULAR LENS PLACEMENT RIGHT EYE; CDE:  4.66;  Surgeon: Jethro Bolus, MD;  Location: [facility];  Service: Ophthalmology;  Laterality: Right;   CHOLECYSTECTOMY     COLONOSCOPY  10/16/2008   HYQ:MVHQIONGEXB, otherwise normal rectum; pancolonic diverticula the remainder of the colonic mucosa appeared normal. Next TCS due 10/2013.   COLONOSCOPY  06/28/2006   RMR: Internal hemorrhoids. Diminutive rectal  polyps, cold biopsied/ Pancolonic diverticula. Polyps in the right colon removed    COLONOSCOPY WITH PROPOFOL N/A 05/20/2015   RMR: Pancolonic diverticulosis. Redundant colon    COLONOSCOPY WITH PROPOFOL N/A 11/11/2020   Procedure: COLONOSCOPY WITH PROPOFOL;  Surgeon: Corbin Ade, MD;  Location: [facility];  Service: Endoscopy;  Laterality: N/A;  pm ASA 2   ESOPHAGOGASTRODUODENOSCOPY  10/16/2008   MWU:XLKGM hiatal hernia otherwise normal esophagus, stomach  D1, D2, status post passage of a 56-French Maloney dilator   ESOPHAGOGASTRODUODENOSCOPY  06/28/2006   WNU:UVOZDG esophagus. Small hiatal hernia. Otherwise normal stomach, D1 and D2, status post passage of a 56 French Maloney dilator   ESOPHAGOGASTRODUODENOSCOPY (EGD) WITH ESOPHAGEAL DILATION N/A 07/14/2013   RMR: Abnormal esophagus of uncertain significance-status post esophageal biospy. small hiatal hernia.    ESOPHAGOGASTRODUODENOSCOPY (EGD) WITH PROPOFOL N/A 05/20/2015   RMR: NORMAL EGD status post maloney dilation    ESOPHAGOGASTRODUODENOSCOPY (EGD) WITH PROPOFOL N/A 12/23/2018   Dr. Jena Gauss: normal esophagus, s/p esophageal dilation.    FOOT ARTHRODESIS Right 03/31/2022   Procedure: RIGHT FOOT TARSAL-METATARSAL FUSION;  Surgeon: Nadara Mustard, MD;  Location: [facility] OR;  Service: Orthopedics;  Laterality: Right;   FOOT SURGERY     left foot-   GASTROCNEMIUS RECESSION Right 03/31/2022  Procedure: RIGHT ACHILLES LENGTHENING;  Surgeon: Nadara Mustard, MD;  Location: [facility] OR;  Service: Orthopedics;  Laterality: Right;   INCISION AND DRAINAGE Right 01/03/2013   Procedure: INCISION AND DRAINAGE;  Surgeon: Vickki Hearing, MD;  Location: [facility];  Service: Orthopedics;  Laterality: Right;   KNEE ARTHROSCOPY  01/2012   right   KNEE ARTHROSCOPY WITH LATERAL RELEASE Right 01/03/2013   Procedure: Lateral Release Patella Right Knee;  Surgeon: Vickki Hearing, MD;  Location: [facility];  Service: Orthopedics;  Laterality: Right;   LAPAROSCOPIC LYSIS OF  ADHESIONS N/A 11/12/2019   Procedure: LAPAROSCOPIC LYSIS OF ADHESIONS, extensive; drainage of peritoneal cyst;  Surgeon: Lazaro Arms, MD;  Location: [facility];  Service: Gynecology;  Laterality: N/A;   LUMBAR DISC SURGERY  L4-5   MALONEY DILATION N/A 05/20/2015   Procedure: Elease Hashimoto DILATION;  Surgeon: Corbin Ade, MD;  Location: [facility];  Service: Endoscopy;  Laterality: N/AElease Hashimoto dilator # 54   MALONEY DILATION N/A 12/23/2018   Procedure: Elease Hashimoto DILATION;  Surgeon: Corbin Ade, MD;  [facility];  Service: Endoscopy;  Laterality: N/A;   PARTIAL HYSTERECTOMY     TOTAL KNEE ARTHROPLASTY  07/01/2012   Procedure: TOTAL KNEE ARTHROPLASTY;  Surgeon: Vickki Hearing, MD;  Location: [facility];  Service: Orthopedics;  Laterality: Right;   TOTAL KNEE REVISION Right 01/03/2013   Procedure: Patellaplasty Right Knee;  Surgeon: Vickki Hearing, MD;  Location: [facility];  Service: Orthopedics;  Laterality: Right;   Patient Active Problem List   Diagnosis Date Noted   Primary osteoarthritis of right foot    Syncope 02/18/2020   Prolonged QT interval 02/18/2020   Glucose intolerance 02/18/2020   Normocytic anemia 12/10/2019   Poor appetite 09/24/2019   Abnormal weight loss 09/24/2019   Upper abdominal mass 09/24/2019   Dysphagia 01/30/2019   Simple adnexal cyst greater than 1 cm in diameter in postmenopausal patient 11/08/2018   Abdominal pain 08/09/2015   Dysphagia, pharyngoesophageal phase    H/O adenomatous polyp of colon 05/05/2015   Constipation 02/02/2015   Knee contusion 12/09/2013   Esophageal dysphagia 07/02/2013   Abdominal pain, epigastric 07/02/2013   Chronic pain syndrome 11/28/2012   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   Cellulitis 11/04/2012   Postoperative wound breakdown 11/04/2012   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   Knee pain 08/13/2012   Hypokalemia 08/01/2012   Cellulitis, wound, post-operative 07/30/2012   Degenerative tear of medial  meniscus of right knee 01/12/2012   OA (osteoarthritis) of knee 01/12/2012   Right knee sprain 11/02/2011   Instability of knee joint 03/29/2011   URTICARIA 12/21/2009   OTHER URINARY INCONTINENCE 07/18/2009   FOOT PAIN, LEFT 05/26/2009   DERMATITIS, CHRONIC 10/21/2008   IMPAIRED GLUCOSE TOLERANCE 09/09/2008   Chronic back pain 09/09/2008   Hypothyroidism 02/27/2008   Hyperlipidemia 02/27/2008   Anxiety with depression 02/27/2008   Bipolar I disorder, most recent episode (or current) depressed, unspecified 02/27/2008   HYPERTENSION 02/27/2008   ALLERGIC RHINITIS 02/27/2008   GERD 02/27/2008   HEADACHE 02/27/2008    PCP: Elfredia Nevins MD  REFERRING PROVIDER: Nadara Mustard, MD  Next apt: 07/27/22  REFERRING DIAG: Y69.485I (ICD-10-CM) - Sprain of tarsometatarsal ligament of right foot, subsequent encounter   THERAPY DIAG:  Pain in right ankle and joints of right foot  Difficulty in walking, not elsewhere classified  Rationale for Evaluation and Treatment Rehabilitation  ONSET DATE: 03/31/22  SUBJECTIVE:   SUBJECTIVE STATEMENT:  Patient reports another death in her family this past week; states prolonged standing causes swelling right foot Eval: Patient presents with RT foot and ankle pain s/p RT metatarsal fusion on 03/31/20. She was having trouble for several months and they thought it was a sprain. She wore boot for 6 weeks but did not get any better. She was sent for surgery. She is still having trouble walking, still having pain. She says her sx are no better since surgery. She has not had formal therapy for this. She is still taking pain medication and she is walking with a SPC.   PERTINENT HISTORY: Arthritis, HTN, anxiety  PAIN:  Are you having pain? Yes: NPRS scale: 6/10 Pain location: RT dorsal foot down to great toe  Pain description: throbbing, stinging Aggravating factors: standing, walking Relieving factors: meds, soaking in epsom salt   PRECAUTIONS:  None  WEIGHT BEARING RESTRICTIONS No  FALLS:  Has patient fallen in last 6 months? Yes. Number of falls 2  LIVING ENVIRONMENT: Lives with: lives with their partner Lives in: House/apartment Stairs: No Has following equipment at home: Single point cane and Environmental consultantWalker - 2 wheeled  OCCUPATION: Retired   PLOF: Independent with basic ADLs  PATIENT GOALS Decrease pain and swelling    OBJECTIVE:   DIAGNOSTIC FINDINGS: NA  PATIENT SURVEYS:  FOTO 45% function  COGNITION:  Overall cognitive status: Within functional limits for tasks assessed     SENSATION: Reports numbness/ tingling in RT foot   EDEMA:  Min/ mod edema in RT foot   PALPATION: Hyper sensitive about incision and top of RT foot  LOWER EXTREMITY ROM:  Active ROM Right eval Left eval  Hip flexion    Hip extension    Hip abduction    Hip adduction    Hip internal rotation    Hip external rotation    Knee flexion    Knee extension    Ankle dorsiflexion -6 4  Ankle plantarflexion 10 18  Ankle inversion  18  Ankle eversion  12   (Blank rows = not tested)  LOWER EXTREMITY MMT:  MMT Right eval Left eval Right 07/19/22  Hip flexion     Hip extension     Hip abduction     Hip adduction     Hip internal rotation     Hip external rotation     Knee flexion     Knee extension     Ankle dorsiflexion 3-  2+ *  Ankle plantarflexion     Ankle inversion 3-  3-*  Ankle eversion 3-  3-*   (Blank rows = not tested)  *=pain     FUNCTIONAL TESTS:  2 minute walk test: 100 feet  GAIT: Distance walked: 100 feet  Assistive device utilized: Single point cane Level of assistance: Modified independence Comments: decreased stride, antalgic, decreased stance on RT    TODAY'S TREATMENT: 07/28/22 Seated: Heel slides x 2' Bolster roll for DF/PF and Inv/Ev x 2" each Marble pick up  4 way yellow Theraband x 12 each  Manual STW to right foot and ankle t decrease pain and improve tissue mobility x 10'; no  other treatment performed during manual treatment.   07/19/22 FOTO 41.5% BAPS L2 5x each DF/PF; Inv/Ev; CW, increased pain with CCW 2MWT 3880ft with SPC ROM  MMT  Manual STM, retrograde massage and ankle PROM x 10' Educated on benefits with compression garments for edema control, measurements taken and given handout for ETI.   Educated on RICE  for pain and edema control.    07/10/22 Bolster roll f/B and S/S x 2 min each 4 way Tband yellow x 10 reps each Heel slides with towel on box x 2' for dorsiflexion Dorsiflexion stretch with strap 5 x 20" Marble pick up x 3' (able to pick up 1/2 the marbles today)  STM and ankle PROM and great toe flexion/extension x 10' to decrease pain and increase tissue mobility; no other intervention performed during manual treatment    07/06/22 Ankle circles x 20 each way CW and CCW Seated heel slide for dorsiflexion on box x 2' Seated calf stretch with strap 5 x 20" 4 way yellow thera band x 10 each Towel crunches 2' Marble pick up attempt x 3' Rocker board F/B and S/S x 2' each  STM and ankle PROM and great toe flexion/extension x 10' to decrease pain and increase tissue mobility; no other intervention performed during manual treatment   07/04/22 Ankle circle x20 CW/ CCW  Seated calf stretch 3 x 30"  Ankle band 4 way (inv/ev/df/ pf) YTB x20 each  Towel crunch 2 min    Manual STM and scar tissue massage Manual Ankle PROM all planes and great toe flexion/ extension  06/29/22 Review of HEP and goals Seated  Ankle circles CW and CCW x 1' each Heel slides on 4" step x 2' for dorsiflexion and knee flexion Heel/toe raises on 4" step x 1' Towel crunches x 1'  Towel inversion/eversion x 1' Dorsiflexion stretch with strap 5 x 20" Manual isometrics inv/ev/plantar flexion 5" x 10 each Rocker board F/B and S/S x 1' each  STM to Right foot to decrease pain and improve tissue mobility x 8' manual dorsiflexion stretch 20" x 5; no other treatment  performed during manual treatment  Eval Ankle circle Manual RT ankle and great toe PROM flexion/ extension    PATIENT EDUCATION:  Education details: on eval findings, POC and HEP  Person educated: Patient Education method: Explanation Education comprehension: verbalized understanding   HOME EXERCISE PROGRAM: Access Code: [email or web address] URL: [email or web address] 07/04/22 - Seated Ankle Plantar Flexion with Resistance Loop  - 2 x daily - 7 x weekly - 2 sets - 10 reps - Seated Ankle Inversion with Resistance  - 2 x daily - 7 x weekly - 2 sets - 10 reps - Seated Ankle Eversion with Resistance  - 2 x daily - 7 x weekly - 2 sets - 10 reps - Seated Ankle Dorsiflexion with Resistance  - 2 x daily - 7 x weekly - 2 sets - 10 reps  Date: 06/20/2022 Prepared by: Georges Shubh Chiara  Exercises - Towel Scrunches  - 3 x daily - 7 x weekly - 3 sets - 10 reps - Ankle Inversion Eversion Towel Slide  - 3 x daily - 7 x weekly - 3 sets - 10 reps - Seated Ankle Circles  - 3 x daily - 7 x weekly - 3 sets - 10 reps - Seated Calf Stretch with Strap  - 3 x daily - 7 x weekly - 1 sets - 4 reps - 30 second hold  ASSESSMENT:  CLINICAL IMPRESSION: Today's session continued to focus on ankle mobility and strengthening.  Able to pick up all marbles today so improving strength of foot intrinsics. She continued to ambulate with a SPC with slower than normal gait speed.  Patient will benefit from continued skilled therapy services during the remainder of her hospital stay and at the next recommended venue of care to  address deficits and promote return to optimal function.       OBJECTIVE IMPAIRMENTS Abnormal gait, decreased activity tolerance, decreased balance, decreased endurance, decreased mobility, difficulty walking, decreased ROM, decreased strength, hypomobility, increased edema, increased fascial restrictions, impaired flexibility, impaired sensation, improper body mechanics, and pain.   ACTIVITY  LIMITATIONS standing, squatting, stairs, transfers, and locomotion level  PARTICIPATION LIMITATIONS: meal prep, cleaning, laundry, driving, shopping, community activity, and yard work  PERSONAL FACTORS Past/current experiences and Time since onset of injury/illness/exacerbation are also affecting patient's functional outcome.   REHAB POTENTIAL: Good  CLINICAL DECISION MAKING: Stable/uncomplicated  EVALUATION COMPLEXITY: Low   GOALS: SHORT TERM GOALS: Target date: 07/18/2022  Patient will be independent with initial HEP and self-management strategies to improve functional outcomes Baseline: 07/19/22:  Reports compliance with HEP daily Goal status: IN PROGRESS   2. Patient will have RT ankle DF AROM at least 5 degrees to improve functional mobility, gait mechanics and stair ambulation.  Baseline: -6; 07/19/22: DF 4 degrees Goal status: IN PROGRESS   LONG TERM GOALS: Target date: 08/15/2022  Patient will be independent with advanced HEP and self-management strategies to improve functional outcomes Baseline: 07/19/22:  Reports compliance with HEP daily with initial exercises, no advanced exercises given. Goal status: IN PROGRESS  2.  Patient will improve FOTO score to predicted value to indicate improvement in functional outcomes Baseline: 45%; 07/19/22: FOTO score 41.5% Goal status: IN PROGRESS  3.  Patient will have RT ankle DF AROM at least 10 degrees to improve functional mobility, gait mechanics and stair ambulation.  Baseline: -6; 07/19/22: DF 4 degrees Goal status: IN PROGRESS  4. Patient will have equal to or > 4/5 MMT throughout RT ankle to improve ability to perform functional mobility, stair ambulation and ADLs.  Baseline: See MMT; 07/19/22: no improvements see above limited by pain Goal status: IN PROGRESS  5. Patient will be able to ambulate at least 300 feet during with LRAD to demonstrate improved ability to perform functional mobility and associated  tasks. Baseline: 100 feet with SPC; 07/19/22: with SPC 57ft limited by pain Goal status: IN PROGRESS   PLAN: PT FREQUENCY: 2x/week  PT DURATION: 8 weeks  PLANNED INTERVENTIONS: Therapeutic exercises, Therapeutic activity, Neuromuscular re-education, Balance training, Gait training, Patient/Family education, Joint manipulation, Joint mobilization, Stair training, Aquatic Therapy, Dry Needling, Electrical stimulation, Spinal manipulation, Spinal mobilization, Cryotherapy, Moist heat, scar mobilization, Taping, Traction, Ultrasound, Biofeedback, Ionotophoresis 4mg /ml Dexamethasone, and Manual therapy.   PLAN FOR NEXT SESSION: Manual Rt ankle PROM and scar tissue massage. Progress ankle strength and mobility as tolerated. Isometrics, BAPs, stretching   11:13 AM, 07/28/22 Ellan Tess Small Jacyln Carmer MPT [facility] [place] [phone]

## 2022-08-01 ENCOUNTER — Encounter (HOSPITAL_COMMUNITY): Payer: Medicare Other

## 2022-08-02 ENCOUNTER — Ambulatory Visit (HOSPITAL_COMMUNITY): Payer: Medicare Other

## 2022-08-02 ENCOUNTER — Encounter (HOSPITAL_COMMUNITY): Payer: Self-pay

## 2022-08-02 NOTE — Therapy (Signed)
OUTPATIENT PHYSICAL THERAPY LOWER EXTREMITY TREATMENT   Patient Name: Diane [name] MRN: [id number] DOB:30-Jan-1951, 71 y.o., female Today's Date: 08/02/2022         Past Medical History:  Diagnosis Date   Allergic rhinitis    Anxiety    Arthritis    Cataracts, bilateral    Chronic back pain    Sees pain [facility] clinic   Chronic pain syndrome    Constipation    Depression    Essential hypertension    GERD (gastroesophageal reflux disease)    Headaches, cluster    History of seizures    Hypothyroid    Mania ([place])    Mixed hyperlipidemia    PTSD (post-traumatic stress disorder)    Right lumbar radiculopathy    Sleep apnea    STOP BANG score=4   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[facility])    Past Surgical History:  Procedure Laterality Date   BACK SURGERY     BILATERAL OOPHORECTOMY     CATARACT EXTRACTION W/PHACO Left 01/04/2016   Procedure: CATARACT EXTRACTION PHACO AND INTRAOCULAR LENS PLACEMENT ([place]);  Surgeon: Rutherford Guys, MD;  Location: [facility];  Service: Ophthalmology;  Laterality: Left;  CDE:4.70   CATARACT EXTRACTION W/PHACO Right 01/18/2016   Procedure: CATARACT EXTRACTION PHACO AND INTRAOCULAR LENS PLACEMENT RIGHT EYE; CDE:  4.66;  Surgeon: Rutherford Guys, MD;  Location: [facility];  Service: Ophthalmology;  Laterality: Right;   CHOLECYSTECTOMY     COLONOSCOPY  10/16/2008   [id number], otherwise normal rectum; pancolonic diverticula the remainder of the colonic mucosa appeared normal. Next TCS due 10/2013.   COLONOSCOPY  06/28/2006   RMR: Internal hemorrhoids. Diminutive rectal polyps, cold biopsied/ Pancolonic diverticula. Polyps in the right colon removed    COLONOSCOPY WITH PROPOFOL N/A 05/20/2015   RMR: Pancolonic diverticulosis. Redundant colon    COLONOSCOPY WITH PROPOFOL N/A 11/11/2020   Procedure: COLONOSCOPY WITH PROPOFOL;  Surgeon: Daneil Dolin, MD;  [facility];  Service: Endoscopy;  Laterality: N/A;  pm ASA 2   ESOPHAGOGASTRODUODENOSCOPY   10/16/2008   [id number] hiatal hernia otherwise normal esophagus, stomach  D1, D2, status post passage of a 56-French Maloney dilator   ESOPHAGOGASTRODUODENOSCOPY  06/28/2006   [id number] esophagus. Small hiatal hernia. Otherwise normal stomach, D1 and D2, status post passage of a 56 French Maloney dilator   ESOPHAGOGASTRODUODENOSCOPY (EGD) WITH ESOPHAGEAL DILATION N/A 07/14/2013   RMR: Abnormal esophagus of uncertain significance-status post esophageal biospy. small hiatal hernia.    ESOPHAGOGASTRODUODENOSCOPY (EGD) WITH PROPOFOL N/A 05/20/2015   RMR: NORMAL EGD status post maloney dilation    ESOPHAGOGASTRODUODENOSCOPY (EGD) WITH PROPOFOL N/A 12/23/2018   Dr. Gala Romney: normal esophagus, s/p esophageal dilation.    FOOT ARTHRODESIS Right 03/31/2022   Procedure: RIGHT FOOT TARSAL-METATARSAL FUSION;  Surgeon: Newt Minion, MD;  Location: [place];  Service: Orthopedics;  Laterality: Right;   FOOT SURGERY     left foot-   GASTROCNEMIUS RECESSION Right 03/31/2022   Procedure: RIGHT ACHILLES LENGTHENING;  Surgeon: Newt Minion, MD;  Location: [place];  Service: Orthopedics;  Laterality: Right;   INCISION AND DRAINAGE Right 01/03/2013   Procedure: INCISION AND DRAINAGE;  Surgeon: Carole Civil, MD;  Location: [facility];  Service: Orthopedics;  Laterality: Right;   KNEE ARTHROSCOPY  01/2012   right   KNEE ARTHROSCOPY WITH LATERAL RELEASE Right 01/03/2013   Procedure: Lateral Release Patella Right Knee;  Surgeon: Carole Civil, MD;  Location: [facility];  Service: Orthopedics;  Laterality: Right;   LAPAROSCOPIC LYSIS OF  ADHESIONS N/A 11/12/2019   Procedure: LAPAROSCOPIC LYSIS OF ADHESIONS, extensive; drainage of peritoneal cyst;  Surgeon: Florian Buff, MD;  Location: [facility];  Service: Gynecology;  Laterality: N/A;   LUMBAR DISC SURGERY  L4-5   MALONEY DILATION N/A 05/20/2015   Procedure: Venia Minks DILATION;  Surgeon: Daneil Dolin, MD;  Location: [facility];  Service: Endoscopy;  Laterality: N/AVenia Minks dilator #  [id number]   MALONEY DILATION N/A 12/23/2018   Procedure: Venia Minks DILATION;  Surgeon: Daneil Dolin, MD;  [facility];  Service: Endoscopy;  Laterality: N/A;   PARTIAL HYSTERECTOMY     TOTAL KNEE ARTHROPLASTY  07/01/2012   Procedure: TOTAL KNEE ARTHROPLASTY;  Surgeon: Carole Civil, MD;  [facility]: [facility];  Service: Orthopedics;  Laterality: Right;   TOTAL KNEE REVISION Right 01/03/2013   Procedure: Patellaplasty Right Knee;  Surgeon: Carole Civil, MD;  Location: [facility];  Service: Orthopedics;  Laterality: Right;   Patient Active Problem List   Diagnosis Date Noted   Primary osteoarthritis of right foot    Syncope 02/18/2020   Prolonged QT interval 02/18/2020   Glucose intolerance 02/18/2020   Normocytic anemia 12/10/2019   Poor appetite 09/24/2019   Abnormal weight loss 09/24/2019   Upper abdominal mass 09/24/2019   Dysphagia 01/30/2019   Simple adnexal cyst greater than 1 cm in diameter in postmenopausal patient 11/08/2018   Abdominal pain 08/09/2015   Dysphagia, pharyngoesophageal phase    H/O adenomatous polyp of colon 05/05/2015   Constipation 02/02/2015   Knee contusion 12/09/2013   Esophageal dysphagia 07/02/2013   Abdominal pain, epigastric 07/02/2013   Chronic pain syndrome 11/28/2012   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   Cellulitis 11/04/2012   Postoperative wound breakdown 11/04/2012   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   Knee pain 08/13/2012   Hypokalemia 08/01/2012   Cellulitis, wound, post-operative 07/30/2012   Degenerative tear of medial meniscus of right knee 01/12/2012   OA (osteoarthritis) of knee 01/12/2012   Right knee sprain 11/02/2011   Instability of knee joint 03/29/2011   URTICARIA 12/21/2009   OTHER URINARY INCONTINENCE 07/18/2009   FOOT PAIN, LEFT 05/26/2009   DERMATITIS, CHRONIC 10/21/2008   IMPAIRED GLUCOSE TOLERANCE 09/09/2008   Chronic back pain 09/09/2008   Hypothyroidism 02/27/2008   Hyperlipidemia  02/27/2008   Anxiety with depression 02/27/2008   Bipolar I disorder, most recent episode (or current) depressed, unspecified 02/27/2008   HYPERTENSION 02/27/2008   ALLERGIC RHINITIS 02/27/2008   GERD 02/27/2008   HEADACHE 02/27/2008    PCP: Redmond School MD  REFERRING PROVIDER: Newt Minion, MD  Next apt: 07/27/22  REFERRING DIAG: [id number] (ICD-10-CM) - Sprain of tarsometatarsal ligament of right foot, subsequent encounter   THERAPY DIAG:  No diagnosis found.  Rationale for Evaluation and Treatment Rehabilitation  ONSET DATE: 03/31/22  SUBJECTIVE:   SUBJECTIVE STATEMENT:  08/02/22:  Error, no session complete today.   Eval: Patient presents with RT foot and ankle pain s/p RT metatarsal fusion on 03/31/20. She was having trouble for several months and they thought it was a sprain. She wore boot for 6 weeks but did not get any better. She was sent for surgery. She is still having trouble walking, still having pain. She says her sx are no better since surgery. She has not had formal therapy for this. She is still taking pain medication and she is walking with a SPC.   PERTINENT HISTORY: Arthritis, HTN, anxiety  PAIN:  Are you having pain?  Yes: NPRS scale: 6/10 Pain location: RT dorsal foot down to great toe  Pain description: throbbing, stinging Aggravating factors: standing, walking Relieving factors: meds, soaking in epsom salt   PRECAUTIONS: None  WEIGHT BEARING RESTRICTIONS No  FALLS:  Has patient fallen in last 6 months? Yes. Number of falls 2  LIVING ENVIRONMENT: Lives with: lives with their partner Lives in: House/apartment Stairs: No Has following equipment at home: Single point cane and Environmental consultant - 2 wheeled  OCCUPATION: Retired   PLOF: Independent with basic ADLs  PATIENT GOALS Decrease pain and swelling    OBJECTIVE:   DIAGNOSTIC FINDINGS: NA  PATIENT SURVEYS:  FOTO 45% function  COGNITION:  Overall cognitive status: Within functional limits  for tasks assessed     SENSATION: Reports numbness/ tingling in RT foot   EDEMA:  Min/ mod edema in RT foot   PALPATION: Hyper sensitive about incision and top of RT foot  LOWER EXTREMITY ROM:  Active ROM Right eval Left eval  Hip flexion    Hip extension    Hip abduction    Hip adduction    Hip internal rotation    Hip external rotation    Knee flexion    Knee extension    Ankle dorsiflexion -6 4  Ankle plantarflexion 10 18  Ankle inversion  18  Ankle eversion  12   (Blank rows = not tested)  LOWER EXTREMITY MMT:  MMT Right eval Left eval Right 07/19/22  Hip flexion     Hip extension     Hip abduction     Hip adduction     Hip internal rotation     Hip external rotation     Knee flexion     Knee extension     Ankle dorsiflexion 3-  2+ *  Ankle plantarflexion     Ankle inversion 3-  3-*  Ankle eversion 3-  3-*   (Blank rows = not tested)  *=pain     FUNCTIONAL TESTS:  2 minute walk test: 100 feet  GAIT: Distance walked: 100 feet  Assistive device utilized: Single point cane Level of assistance: Modified independence Comments: decreased stride, antalgic, decreased stance on RT    TODAY'S TREATMENT: 07/28/22 Seated: Heel slides x 2' Bolster roll for DF/PF and Inv/Ev x 2" each Marble pick up  4 way yellow Theraband x 12 each  Manual STW to right foot and ankle t decrease pain and improve tissue mobility x 10'; no other treatment performed during manual treatment.   07/19/22 FOTO 41.5% BAPS L2 5x each DF/PF; Inv/Ev; CW, increased pain with CCW 61ft with SPC ROM  MMT  Manual STM, retrograde massage and ankle PROM x 10' Educated on benefits with compression garments for edema control, measurements taken and given handout for ETI.   Educated on RICE for pain and edema control.    07/10/22 Bolster roll f/B and S/S x 2 min each 4 way Tband yellow x 10 reps each Heel slides with towel on box x 2' for dorsiflexion Dorsiflexion stretch  with strap 5 x 20" Marble pick up x 3' (able to pick up 1/2 the marbles today)  STM and ankle PROM and great toe flexion/extension x 10' to decrease pain and increase tissue mobility; no other intervention performed during manual treatment    07/06/22 Ankle circles x 20 each way CW and CCW Seated heel slide for dorsiflexion on box x 2' Seated calf stretch with strap 5 x 20" 4 way yellow thera band  x 10 each Towel crunches 2' Marble pick up attempt x 3' Rocker board F/B and S/S x 2' each  STM and ankle PROM and great toe flexion/extension x 10' to decrease pain and increase tissue mobility; no other intervention performed during manual treatment   07/04/22 Ankle circle x20 CW/ CCW  Seated calf stretch 3 x 30"  Ankle band 4 way (inv/ev/df/ pf) YTB x20 each  Towel crunch 2 min    Manual STM and scar tissue massage Manual Ankle PROM all planes and great toe flexion/ extension  06/29/22 Review of HEP and goals Seated  Ankle circles CW and CCW x 1' each Heel slides on 4" step x 2' for dorsiflexion and knee flexion Heel/toe raises on 4" step x 1' Towel crunches x 1'  Towel inversion/eversion x 1' Dorsiflexion stretch with strap 5 x 20" Manual isometrics inv/ev/plantar flexion 5" x 10 each Rocker board F/B and S/S x 1' each  STM to Right foot to decrease pain and improve tissue mobility x 8' manual dorsiflexion stretch 20" x 5; no other treatment performed during manual treatment  Eval Ankle circle Manual RT ankle and great toe PROM flexion/ extension    PATIENT EDUCATION:  Education details: on eval findings, POC and HEP  Person educated: Patient Education method: Explanation Education comprehension: verbalized understanding   HOME EXERCISE PROGRAM: Access Code: [email or web address] URL: [email or web address] 07/04/22 - Seated Ankle Plantar Flexion with Resistance Loop  - 2 x daily - 7 x weekly - 2 sets - 10 reps - Seated Ankle Inversion with Resistance  - 2 x  daily - 7 x weekly - 2 sets - 10 reps - Seated Ankle Eversion with Resistance  - 2 x daily - 7 x weekly - 2 sets - 10 reps - Seated Ankle Dorsiflexion with Resistance  - 2 x daily - 7 x weekly - 2 sets - 10 reps  Date: 06/20/2022 Prepared by: Josue Hector  Exercises - Towel Scrunches  - 3 x daily - 7 x weekly - 3 sets - 10 reps - Ankle Inversion Eversion Towel Slide  - 3 x daily - 7 x weekly - 3 sets - 10 reps - Seated Ankle Circles  - 3 x daily - 7 x weekly - 3 sets - 10 reps - Seated Calf Stretch with Strap  - 3 x daily - 7 x weekly - 1 sets - 4 reps - 30 second hold  ASSESSMENT:  CLINICAL IMPRESSION: Today's session continued to focus on ankle mobility and strengthening.  Able to pick up all marbles today so improving strength of foot intrinsics. She continued to ambulate with a SPC with slower than normal gait speed.  Patient will benefit from continued skilled therapy services during the remainder of her hospital stay and at the next recommended venue of care to address deficits and promote return to optimal function.       OBJECTIVE IMPAIRMENTS Abnormal gait, decreased activity tolerance, decreased balance, decreased endurance, decreased mobility, difficulty walking, decreased ROM, decreased strength, hypomobility, increased edema, increased fascial restrictions, impaired flexibility, impaired sensation, improper body mechanics, and pain.   ACTIVITY LIMITATIONS standing, squatting, stairs, transfers, and locomotion level  PARTICIPATION LIMITATIONS: meal prep, cleaning, laundry, driving, shopping, community activity, and yard work  PERSONAL FACTORS Past/current experiences and Time since onset of injury/illness/exacerbation are also affecting patient's functional outcome.   REHAB POTENTIAL: Good  CLINICAL DECISION MAKING: Stable/uncomplicated  EVALUATION COMPLEXITY: Low   GOALS: SHORT TERM GOALS: Target date: 07/18/2022  Patient will  be independent with initial HEP and  self-management strategies to improve functional outcomes Baseline: 07/19/22:  Reports compliance with HEP daily Goal status: IN PROGRESS   2. Patient will have RT ankle DF AROM at least 5 degrees to improve functional mobility, gait mechanics and stair ambulation.  Baseline: -6; 07/19/22: DF 4 degrees Goal status: IN PROGRESS   LONG TERM GOALS: Target date: 08/15/2022  Patient will be independent with advanced HEP and self-management strategies to improve functional outcomes Baseline: 07/19/22:  Reports compliance with HEP daily with initial exercises, no advanced exercises given. Goal status: IN PROGRESS  2.  Patient will improve FOTO score to predicted value to indicate improvement in functional outcomes Baseline: 45%; 07/19/22: FOTO score 41.5% Goal status: IN PROGRESS  3.  Patient will have RT ankle DF AROM at least 10 degrees to improve functional mobility, gait mechanics and stair ambulation.  Baseline: -6; 07/19/22: DF 4 degrees Goal status: IN PROGRESS  4. Patient will have equal to or > 4/5 MMT throughout RT ankle to improve ability to perform functional mobility, stair ambulation and ADLs.  Baseline: See MMT; 07/19/22: no improvements see above limited by pain Goal status: IN PROGRESS  5. Patient will be able to ambulate at least 300 feet during 2MWT with LRAD to demonstrate improved ability to perform functional mobility and associated tasks. Baseline: 100 feet with SPC; 07/19/22: 2MWT with SPC 46ft limited by pain Goal status: IN PROGRESS   PLAN: PT FREQUENCY: 2x/week  PT DURATION: 8 weeks  PLANNED INTERVENTIONS: Therapeutic exercises, Therapeutic activity, Neuromuscular re-education, Balance training, Gait training, Patient/Family education, Joint manipulation, Joint mobilization, Stair training, Aquatic Therapy, Dry Needling, Electrical stimulation, Spinal manipulation, Spinal mobilization, Cryotherapy, Moist heat, scar mobilization, Taping, Traction, Ultrasound,  Biofeedback, Ionotophoresis 4mg /ml Dexamethasone, and Manual therapy.   PLAN FOR NEXT SESSION: Manual Rt ankle PROM and scar tissue massage. Progress ankle strength and mobility as tolerated. Isometrics, BAPs, stretching   7:22 AM, 08/02/22 Amy Small Lynch MPT [place] [facility]  [phone]

## 2022-08-04 ENCOUNTER — Other Ambulatory Visit: Payer: Self-pay | Admitting: Orthopedic Surgery

## 2022-08-07 ENCOUNTER — Ambulatory Visit (INDEPENDENT_AMBULATORY_CARE_PROVIDER_SITE_OTHER): Payer: Medicare Other | Admitting: Orthopedic Surgery

## 2022-08-07 ENCOUNTER — Ambulatory Visit (INDEPENDENT_AMBULATORY_CARE_PROVIDER_SITE_OTHER): Payer: Medicare Other

## 2022-08-07 DIAGNOSIS — Z96651 Presence of right artificial knee joint: Secondary | ICD-10-CM

## 2022-08-07 DIAGNOSIS — M1710 Unilateral primary osteoarthritis, unspecified knee: Secondary | ICD-10-CM

## 2022-08-07 DIAGNOSIS — G894 Chronic pain syndrome: Secondary | ICD-10-CM | POA: Diagnosis not present

## 2022-08-07 MED ORDER — OXYCODONE-ACETAMINOPHEN 7.5-325 MG PO TABS: 1.0000 | ORAL_TABLET | Freq: Three times a day (TID) | ORAL | 0 refills | Status: DC | PRN

## 2022-08-07 NOTE — Progress Notes (Signed)
Chief Complaint  Patient presents with   Follow-up    Right knee TKA, DOS 07-01-12.    Encounter Diagnosis  Name Primary?   Arthritis of knee Yes   Follow-up arthritis right knee status post right total knee in 07-01-2022 this is a 10-year follow-up x-ray visit.  No issues with the right knee at this time  X-rays show stable prosthesis with some edge loading on the patella but no further treatment recommended  Patient is concerned about pain management after she had foot surgery.  I told her I would let Dr. Lajoyce Corners know that I would resume managing her chronic pain  Meds ordered this encounter  Medications   oxyCODONE-acetaminophen (PERCOCET) 7.5-325 MG tablet    Sig: Take 1 tablet by mouth every 8 (eight) hours as needed for severe pain.    Dispense:  20 tablet    Refill:  0

## 2022-08-08 ENCOUNTER — Encounter (HOSPITAL_COMMUNITY): Payer: Self-pay

## 2022-08-08 ENCOUNTER — Ambulatory Visit (HOSPITAL_COMMUNITY): Payer: Medicare Other

## 2022-08-08 DIAGNOSIS — M25571 Pain in right ankle and joints of right foot: Secondary | ICD-10-CM

## 2022-08-08 DIAGNOSIS — R262 Difficulty in walking, not elsewhere classified: Secondary | ICD-10-CM

## 2022-08-08 NOTE — Therapy (Addendum)
OUTPATIENT PHYSICAL THERAPY LOWER EXTREMITY TREATMENT   Patient Name: Diane [name] MRN: [id number] DOB:30-Dec-1950, 71 y.o., female Today's Date: 08/08/2022    08/08/22 1132  PT Visits / Re-Eval  Visit Number 8  Number of Visits 16  Date for PT Re-Evaluation 08/15/22  Authorization  Authorization Type [employer]  Progress Note Due on Visit 10  PT Time Calculation  PT Start Time 1128 (delayed start due to restroom break)  PT Stop Time 1206  PT Time Calculation (min) 38 min  PT - End of Session  Activity Tolerance Patient tolerated treatment well  Behavior During Therapy WFL for tasks assessed/performed         Past Medical History:  Diagnosis Date   Allergic rhinitis    Anxiety    Arthritis    Cataracts, bilateral    Chronic back pain    Sees pain mamagement clinic   Chronic pain syndrome    Constipation    Depression    Essential hypertension    GERD (gastroesophageal reflux disease)    Headaches, cluster    History of seizures    Hypothyroid    Mania (HCC)    Mixed hyperlipidemia    PTSD (post-traumatic stress disorder)    Right lumbar radiculopathy    Sleep apnea    STOP BANG score=4   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[facility])    Past Surgical History:  Procedure Laterality Date   BACK SURGERY     BILATERAL OOPHORECTOMY     CATARACT EXTRACTION W/PHACO Left 01/04/2016   Procedure: CATARACT EXTRACTION PHACO AND INTRAOCULAR LENS PLACEMENT ([place]);  Surgeon: Rutherford Guys, MD;  Location: [facility];  Service: Ophthalmology;  Laterality: Left;  CDE:4.70   CATARACT EXTRACTION W/PHACO Right 01/18/2016   Procedure: CATARACT EXTRACTION PHACO AND INTRAOCULAR LENS PLACEMENT RIGHT EYE; CDE:  4.66;  Surgeon: Rutherford Guys, MD;  Location: [facility];  Service: Ophthalmology;  Laterality: Right;   CHOLECYSTECTOMY     COLONOSCOPY  10/16/2008   [id number], otherwise normal rectum; pancolonic diverticula the remainder of the colonic mucosa appeared normal. Next TCS due 10/2013.    COLONOSCOPY  06/28/2006   RMR: Internal hemorrhoids. Diminutive rectal polyps, cold biopsied/ Pancolonic diverticula. Polyps in the right colon removed    COLONOSCOPY WITH PROPOFOL N/A 05/20/2015   RMR: Pancolonic diverticulosis. Redundant colon    COLONOSCOPY WITH PROPOFOL N/A 11/11/2020   Procedure: COLONOSCOPY WITH PROPOFOL;  Surgeon: Daneil Dolin, MD;  [facility];  Service: Endoscopy;  Laterality: N/A;  pm ASA 2   ESOPHAGOGASTRODUODENOSCOPY  10/16/2008   [id number] hiatal hernia otherwise normal esophagus, stomach  D1, D2, status post passage of a 56-French Maloney dilator   ESOPHAGOGASTRODUODENOSCOPY  06/28/2006   [id number] esophagus. Small hiatal hernia. Otherwise normal stomach, D1 and D2, status post passage of a 56 French Maloney dilator   ESOPHAGOGASTRODUODENOSCOPY (EGD) WITH ESOPHAGEAL DILATION N/A 07/14/2013   RMR: Abnormal esophagus of uncertain significance-status post esophageal biospy. small hiatal hernia.    ESOPHAGOGASTRODUODENOSCOPY (EGD) WITH PROPOFOL N/A 05/20/2015   RMR: NORMAL EGD status post maloney dilation    ESOPHAGOGASTRODUODENOSCOPY (EGD) WITH PROPOFOL N/A 12/23/2018   Dr. Gala Romney: normal esophagus, s/p esophageal dilation.    FOOT ARTHRODESIS Right 03/31/2022   Procedure: RIGHT FOOT TARSAL-METATARSAL FUSION;  Surgeon: Newt Minion, MD;  Location: [place];  Service: Orthopedics;  Laterality: Right;   FOOT SURGERY     left foot-   GASTROCNEMIUS RECESSION Right 03/31/2022   Procedure: RIGHT ACHILLES LENGTHENING;  Surgeon: Newt Minion,  MD;  Location: [place];  Service: Orthopedics;  Laterality: Right;   INCISION AND DRAINAGE Right 01/03/2013   Procedure: INCISION AND DRAINAGE;  Surgeon: Carole Civil, MD;  Location: [facility];  Service: Orthopedics;  Laterality: Right;   KNEE ARTHROSCOPY  01/2012   right   KNEE ARTHROSCOPY WITH LATERAL RELEASE Right 01/03/2013   Procedure: Lateral Release Patella Right Knee;  Surgeon: Carole Civil, MD;  Location: [facility];   Service: Orthopedics;  Laterality: Right;   LAPAROSCOPIC LYSIS OF ADHESIONS N/A 11/12/2019   Procedure: LAPAROSCOPIC LYSIS OF ADHESIONS, extensive; drainage of peritoneal cyst;  Surgeon: Florian Buff, MD;  Location: [facility];  Service: Gynecology;  Laterality: N/A;   LUMBAR DISC SURGERY  L4-5   MALONEY DILATION N/A 05/20/2015   Procedure: Venia Minks DILATION;  Surgeon: Daneil Dolin, MD;  Location: [facility];  Service: Endoscopy;  Laterality: N/AVenia Minks dilator # 70   MALONEY DILATION N/A 12/23/2018   Procedure: Venia Minks DILATION;  Surgeon: Daneil Dolin, MD;  [facility];  Service: Endoscopy;  Laterality: N/A;   PARTIAL HYSTERECTOMY     TOTAL KNEE ARTHROPLASTY  07/01/2012   Procedure: TOTAL KNEE ARTHROPLASTY;  Surgeon: Carole Civil, MD;  Location: [facility];  Service: Orthopedics;  Laterality: Right;   TOTAL KNEE REVISION Right 01/03/2013   Procedure: Patellaplasty Right Knee;  Surgeon: Carole Civil, MD;  Location: [facility];  Service: Orthopedics;  Laterality: Right;   Patient Active Problem List   Diagnosis Date Noted   Primary osteoarthritis of right foot    Syncope 02/18/2020   Prolonged QT interval 02/18/2020   Glucose intolerance 02/18/2020   Normocytic anemia 12/10/2019   Poor appetite 09/24/2019   Abnormal weight loss 09/24/2019   Upper abdominal mass 09/24/2019   Dysphagia 01/30/2019   Simple adnexal cyst greater than 1 cm in diameter in postmenopausal patient 11/08/2018   Abdominal pain 08/09/2015   Dysphagia, pharyngoesophageal phase    H/O adenomatous polyp of colon 05/05/2015   Constipation 02/02/2015   Knee contusion 12/09/2013   Esophageal dysphagia 07/02/2013   Abdominal pain, epigastric 07/02/2013   Chronic pain syndrome 11/28/2012   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   Cellulitis 11/04/2012   Postoperative wound breakdown 11/04/2012   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   Knee pain 08/13/2012   Hypokalemia 08/01/2012    Cellulitis, wound, post-operative 07/30/2012   Degenerative tear of medial meniscus of right knee 01/12/2012   OA (osteoarthritis) of knee 01/12/2012   Right knee sprain 11/02/2011   Instability of knee joint 03/29/2011   URTICARIA 12/21/2009   OTHER URINARY INCONTINENCE 07/18/2009   FOOT PAIN, LEFT 05/26/2009   DERMATITIS, CHRONIC 10/21/2008   IMPAIRED GLUCOSE TOLERANCE 09/09/2008   Chronic back pain 09/09/2008   Hypothyroidism 02/27/2008   Hyperlipidemia 02/27/2008   Anxiety with depression 02/27/2008   Bipolar I disorder, most recent episode (or current) depressed, unspecified 02/27/2008   HYPERTENSION 02/27/2008   ALLERGIC RHINITIS 02/27/2008   GERD 02/27/2008   HEADACHE 02/27/2008    PCP: Redmond School MD  REFERRING PROVIDER: Newt Minion, MD  Next apt: 08/26/22  REFERRING DIAG: [id number] (ICD-10-CM) - Sprain of tarsometatarsal ligament of right foot, subsequent encounter   THERAPY DIAG:  No diagnosis found.  Rationale for Evaluation and Treatment Rehabilitation  ONSET DATE: 03/31/22  SUBJECTIVE:   SUBJECTIVE STATEMENT:  08/08/22:  Pt stated pain comes and goes, currently pain free this morning.    Eval: Patient presents with RT  foot and ankle pain s/p RT metatarsal fusion on 03/31/20. She was having trouble for several months and they thought it was a sprain. She wore boot for 6 weeks but did not get any better. She was sent for surgery. She is still having trouble walking, still having pain. She says her sx are no better since surgery. She has not had formal therapy for this. She is still taking pain medication and she is walking with a SPC.   PERTINENT HISTORY: Arthritis, HTN, anxiety  PAIN:  Are you having pain? Yes: NPRS scale: 6/10 Pain location: RT dorsal foot down to great toe  Pain description: throbbing, stinging Aggravating factors: standing, walking Relieving factors: meds, soaking in epsom salt   PRECAUTIONS: None  WEIGHT BEARING RESTRICTIONS  No  FALLS:  Has patient fallen in last 6 months? Yes. Number of falls 2  LIVING ENVIRONMENT: Lives with: lives with their partner Lives in: House/apartment Stairs: No Has following equipment at home: Single point cane and Environmental consultant - 2 wheeled  OCCUPATION: Retired   PLOF: Independent with basic ADLs  PATIENT GOALS Decrease pain and swelling    OBJECTIVE:   DIAGNOSTIC FINDINGS: NA  PATIENT SURVEYS:  FOTO 45% function  COGNITION:  Overall cognitive status: Within functional limits for tasks assessed     SENSATION: Reports numbness/ tingling in RT foot   EDEMA:  Min/ mod edema in RT foot   PALPATION: Hyper sensitive about incision and top of RT foot  LOWER EXTREMITY ROM:  Active ROM Right eval Left eval  Hip flexion    Hip extension    Hip abduction    Hip adduction    Hip internal rotation    Hip external rotation    Knee flexion    Knee extension    Ankle dorsiflexion -6 4  Ankle plantarflexion 10 18  Ankle inversion  18  Ankle eversion  12   (Blank rows = not tested)  LOWER EXTREMITY MMT:  MMT Right eval Left eval Right 07/19/22  Hip flexion     Hip extension     Hip abduction     Hip adduction     Hip internal rotation     Hip external rotation     Knee flexion     Knee extension     Ankle dorsiflexion 3-  2+ *  Ankle plantarflexion     Ankle inversion 3-  3-*  Ankle eversion 3-  3-*   (Blank rows = not tested)  *=pain     FUNCTIONAL TESTS:  2 minute walk test: 100 feet  GAIT: Distance walked: 100 feet  Assistive device utilized: Single point cane Level of assistance: Modified independence Comments: decreased stride, antalgic, decreased stance on RT    TODAY'S TREATMENT: 08/08/22: Isometric 10x5" pushing into ball BAPS L3 DF/PF; Inv/Ev; CW/CCW RTB 4 way 10x 5" each Towel gastroc stretch 2x 30"  07/28/22 Seated: Heel slides x 2' Bolster roll for DF/PF and Inv/Ev x 2" each Marble pick up  4 way yellow Theraband x 12  each  Manual STW to right foot and ankle t decrease pain and improve tissue mobility x 10'; no other treatment performed during manual treatment.   07/19/22 FOTO 41.5% BAPS L2 5x each DF/PF; Inv/Ev; CW, increased pain with CCW 73ft with SPC ROM  MMT  Manual STM, retrograde massage and ankle PROM x 10' Educated on benefits with compression garments for edema control, measurements taken and given handout for ETI.   Educated on  RICE for pain and edema control.    07/10/22 Bolster roll f/B and S/S x 2 min each 4 way Tband yellow x 10 reps each Heel slides with towel on box x 2' for dorsiflexion Dorsiflexion stretch with strap 5 x 20" Marble pick up x 3' (able to pick up 1/2 the marbles today)  STM and ankle PROM and great toe flexion/extension x 10' to decrease pain and increase tissue mobility; no other intervention performed during manual treatment    07/06/22 Ankle circles x 20 each way CW and CCW Seated heel slide for dorsiflexion on box x 2' Seated calf stretch with strap 5 x 20" 4 way yellow thera band x 10 each Towel crunches 2' Marble pick up attempt x 3' Rocker board F/B and S/S x 2' each  STM and ankle PROM and great toe flexion/extension x 10' to decrease pain and increase tissue mobility; no other intervention performed during manual treatment   07/04/22 Ankle circle x20 CW/ CCW  Seated calf stretch 3 x 30"  Ankle band 4 way (inv/ev/df/ pf) YTB x20 each  Towel crunch 2 min    Manual STM and scar tissue massage Manual Ankle PROM all planes and great toe flexion/ extension  06/29/22 Review of HEP and goals Seated  Ankle circles CW and CCW x 1' each Heel slides on 4" step x 2' for dorsiflexion and knee flexion Heel/toe raises on 4" step x 1' Towel crunches x 1'  Towel inversion/eversion x 1' Dorsiflexion stretch with strap 5 x 20" Manual isometrics inv/ev/plantar flexion 5" x 10 each Rocker board F/B and S/S x 1' each  STM to Right foot to  decrease pain and improve tissue mobility x 8' manual dorsiflexion stretch 20" x 5; no other treatment performed during manual treatment  Eval Ankle circle Manual RT ankle and great toe PROM flexion/ extension    PATIENT EDUCATION:  Education details: on eval findings, POC and HEP  Person educated: Patient Education method: Explanation Education comprehension: verbalized understanding   HOME EXERCISE PROGRAM: Access Code: [email or web address] URL: [email or web address] 07/04/22 - Seated Ankle Plantar Flexion with Resistance Loop  - 2 x daily - 7 x weekly - 2 sets - 10 reps - Seated Ankle Inversion with Resistance  - 2 x daily - 7 x weekly - 2 sets - 10 reps - Seated Ankle Eversion with Resistance  - 2 x daily - 7 x weekly - 2 sets - 10 reps - Seated Ankle Dorsiflexion with Resistance  - 2 x daily - 7 x weekly - 2 sets - 10 reps  Date: 06/20/2022 Prepared by: Josue Hector  Exercises - Towel Scrunches  - 3 x daily - 7 x weekly - 3 sets - 10 reps - Ankle Inversion Eversion Towel Slide  - 3 x daily - 7 x weekly - 3 sets - 10 reps - Seated Ankle Circles  - 3 x daily - 7 x weekly - 3 sets - 10 reps - Seated Calf Stretch with Strap  - 3 x daily - 7 x weekly - 1 sets - 4 reps - 30 second hold  ASSESSMENT:  CLINICAL IMPRESSION: Today's session continued to focus on ankle mobility and strengthening.  Able to pick up all marbles today so improving strength of foot intrinsics. She continued to ambulate with a SPC with slower than normal gait speed.  Patient will benefit from continued skilled therapy services during the remainder of her hospital stay and at the next recommended venue of care  to address deficits and promote return to optimal function.     Stated she does HEP ~1x week and doesn't feel she has improved much with therapy.   Pt educated importance of HEP compliance for maximal benefits.  Session focus with ankle mobility and strengthening.  Pt required cueing for full range  and hold with isometrics and theraband exercises for maximum contraction with all exercises today.  Pt moves slowly through all exercises.      OBJECTIVE IMPAIRMENTS Abnormal gait, decreased activity tolerance, decreased balance, decreased endurance, decreased mobility, difficulty walking, decreased ROM, decreased strength, hypomobility, increased edema, increased fascial restrictions, impaired flexibility, impaired sensation, improper body mechanics, and pain.   ACTIVITY LIMITATIONS standing, squatting, stairs, transfers, and locomotion level  PARTICIPATION LIMITATIONS: meal prep, cleaning, laundry, driving, shopping, community activity, and yard work  PERSONAL FACTORS Past/current experiences and Time since onset of injury/illness/exacerbation are also affecting patient's functional outcome.   REHAB POTENTIAL: Good  CLINICAL DECISION MAKING: Stable/uncomplicated  EVALUATION COMPLEXITY: Low   GOALS: SHORT TERM GOALS: Target date: 07/18/2022  Patient will be independent with initial HEP and self-management strategies to improve functional outcomes Baseline: 07/19/22:  Reports compliance with HEP daily Goal status: IN PROGRESS   2. Patient will have RT ankle DF AROM at least 5 degrees to improve functional mobility, gait mechanics and stair ambulation.  Baseline: -6; 07/19/22: DF 4 degrees Goal status: IN PROGRESS   LONG TERM GOALS: Target date: 08/15/2022  Patient will be independent with advanced HEP and self-management strategies to improve functional outcomes Baseline: 07/19/22:  Reports compliance with HEP daily with initial exercises, no advanced exercises given. Goal status: IN PROGRESS  2.  Patient will improve FOTO score to predicted value to indicate improvement in functional outcomes Baseline: 45%; 07/19/22: FOTO score 41.5% Goal status: IN PROGRESS  3.  Patient will have RT ankle DF AROM at least 10 degrees to improve functional mobility, gait mechanics and stair  ambulation.  Baseline: -6; 07/19/22: DF 4 degrees Goal status: IN PROGRESS  4. Patient will have equal to or > 4/5 MMT throughout RT ankle to improve ability to perform functional mobility, stair ambulation and ADLs.  Baseline: See MMT; 07/19/22: no improvements see above limited by pain Goal status: IN PROGRESS  5. Patient will be able to ambulate at least 300 feet during 2MWT with LRAD to demonstrate improved ability to perform functional mobility and associated tasks. Baseline: 100 feet with SPC; 07/19/22: 2MWT with SPC 61ft limited by pain Goal status: IN PROGRESS   PLAN: PT FREQUENCY: 2x/week  PT DURATION: 8 weeks  PLANNED INTERVENTIONS: Therapeutic exercises, Therapeutic activity, Neuromuscular re-education, Balance training, Gait training, Patient/Family education, Joint manipulation, Joint mobilization, Stair training, Aquatic Therapy, Dry Needling, Electrical stimulation, Spinal manipulation, Spinal mobilization, Cryotherapy, Moist heat, scar mobilization, Taping, Traction, Ultrasound, Biofeedback, Ionotophoresis 4mg /ml Dexamethasone, and Manual therapy.   PLAN FOR NEXT SESSION: Manual Rt ankle PROM and scar tissue massage. Progress ankle strength and mobility as tolerated. Begin standing ankle strengthening including rocker board, heel/toe raises.  Ihor Austin, LPTA/CLT; CBIS [phone]  11:21 AM, 08/08/22

## 2022-08-09 ENCOUNTER — Other Ambulatory Visit (HOSPITAL_COMMUNITY): Payer: Self-pay | Admitting: Psychiatry

## 2022-08-09 ENCOUNTER — Ambulatory Visit: Payer: Self-pay | Admitting: *Deleted

## 2022-08-09 DIAGNOSIS — F5105 Insomnia due to other mental disorder: Secondary | ICD-10-CM

## 2022-08-09 NOTE — Patient Outreach (Signed)
  Care Coordination   08/09/2022 Name: Diane [name] MRN: [id number] DOB: 1951-09-03   Care Coordination Outreach Attempts:  An unsuccessful telephone outreach was attempted for a scheduled appointment today.  Follow Up Plan:  Additional outreach attempts will be made to offer the patient care coordination information and services.   Encounter Outcome:  No Answer   Care Coordination Interventions:  No, not indicated    Demetrios Loll, BSN, RN-BC RN Care Coordinator [facility]  [facility] Direct Dial: [phone] Main #: [phone]

## 2022-08-10 ENCOUNTER — Encounter (HOSPITAL_COMMUNITY): Payer: Self-pay | Admitting: Emergency Medicine

## 2022-08-10 ENCOUNTER — Observation Stay (EMERGENCY_DEPARTMENT_HOSPITAL)
Admission: EM | Admit: 2022-08-10 | Discharge: 2022-08-12 | Disposition: A | Discharge status: Home / Self Care | Payer: Medicare Other | Source: Home / Self Care | Attending: Emergency Medicine | Admitting: Emergency Medicine

## 2022-08-10 ENCOUNTER — Ambulatory Visit (HOSPITAL_COMMUNITY): Payer: Medicare Other

## 2022-08-10 ENCOUNTER — Emergency Department (HOSPITAL_COMMUNITY): Payer: Medicare Other

## 2022-08-10 ENCOUNTER — Other Ambulatory Visit: Payer: Self-pay

## 2022-08-10 DIAGNOSIS — N3000 Acute cystitis without hematuria: Secondary | ICD-10-CM

## 2022-08-10 DIAGNOSIS — G928 Other toxic encephalopathy: Secondary | ICD-10-CM | POA: Diagnosis not present

## 2022-08-10 DIAGNOSIS — R569 Unspecified convulsions: Secondary | ICD-10-CM | POA: Diagnosis not present

## 2022-08-10 DIAGNOSIS — F3189 Other bipolar disorder: Secondary | ICD-10-CM | POA: Insufficient documentation

## 2022-08-10 DIAGNOSIS — J811 Chronic pulmonary edema: Secondary | ICD-10-CM | POA: Diagnosis not present

## 2022-08-10 DIAGNOSIS — R4781 Slurred speech: Secondary | ICD-10-CM

## 2022-08-10 DIAGNOSIS — R531 Weakness: Secondary | ICD-10-CM | POA: Diagnosis not present

## 2022-08-10 DIAGNOSIS — G40909 Epilepsy, unspecified, not intractable, without status epilepticus: Secondary | ICD-10-CM | POA: Insufficient documentation

## 2022-08-10 DIAGNOSIS — R262 Difficulty in walking, not elsewhere classified: Secondary | ICD-10-CM

## 2022-08-10 DIAGNOSIS — G8929 Other chronic pain: Secondary | ICD-10-CM | POA: Insufficient documentation

## 2022-08-10 DIAGNOSIS — G9341 Metabolic encephalopathy: Secondary | ICD-10-CM

## 2022-08-10 DIAGNOSIS — M25571 Pain in right ankle and joints of right foot: Secondary | ICD-10-CM

## 2022-08-10 DIAGNOSIS — I10 Essential (primary) hypertension: Secondary | ICD-10-CM

## 2022-08-10 DIAGNOSIS — F3130 Bipolar disorder, current episode depressed, mild or moderate severity, unspecified: Secondary | ICD-10-CM

## 2022-08-10 DIAGNOSIS — E039 Hypothyroidism, unspecified: Secondary | ICD-10-CM | POA: Insufficient documentation

## 2022-08-10 DIAGNOSIS — R42 Dizziness and giddiness: Secondary | ICD-10-CM | POA: Diagnosis not present

## 2022-08-10 DIAGNOSIS — G894 Chronic pain syndrome: Secondary | ICD-10-CM | POA: Diagnosis not present

## 2022-08-10 DIAGNOSIS — N390 Urinary tract infection, site not specified: Secondary | ICD-10-CM | POA: Diagnosis present

## 2022-08-10 DIAGNOSIS — Z0389 Encounter for observation for other suspected diseases and conditions ruled out: Secondary | ICD-10-CM | POA: Diagnosis not present

## 2022-08-10 DIAGNOSIS — Z79899 Other long term (current) drug therapy: Secondary | ICD-10-CM | POA: Insufficient documentation

## 2022-08-10 LAB — DIFFERENTIAL
Abs Immature Granulocytes: 0.03 10*3/uL (ref 0.00–0.07)
Basophils Absolute: 0 10*3/uL (ref 0.0–0.1)
Basophils Relative: 1 %
Eosinophils Absolute: 0.1 10*3/uL (ref 0.0–0.5)
Eosinophils Relative: 2 %
Immature Granulocytes: 1 %
Lymphocytes Relative: 47 %
Lymphs Abs: 2.2 10*3/uL (ref 0.7–4.0)
Monocytes Absolute: 0.5 10*3/uL (ref 0.1–1.0)
Monocytes Relative: 10 %
Neutro Abs: 1.8 10*3/uL (ref 1.7–7.7)
Neutrophils Relative %: 39 %

## 2022-08-10 LAB — COMPREHENSIVE METABOLIC PANEL
ALT: 14 U/L (ref 0–44)
AST: 21 U/L (ref 15–41)
Albumin: 3.2 g/dL — ABNORMAL LOW (ref 3.5–5.0)
Alkaline Phosphatase: 46 U/L (ref 38–126)
Anion gap: 7 (ref 5–15)
BUN: 14 mg/dL (ref 8–23)
CO2: 30 mmol/L (ref 22–32)
Calcium: 8.4 mg/dL — ABNORMAL LOW (ref 8.9–10.3)
Chloride: 101 mmol/L (ref 98–111)
Creatinine, Ser: 1.1 mg/dL — ABNORMAL HIGH (ref 0.44–1.00)
GFR, Estimated: 53 mL/min — ABNORMAL LOW (ref >60)
Glucose, Bld: 87 mg/dL (ref 70–99)
Potassium: 4.4 mmol/L (ref 3.5–5.1)
Sodium: 138 mmol/L (ref 135–145)
Total Bilirubin: 0.7 mg/dL (ref 0.3–1.2)
Total Protein: 6.5 g/dL (ref 6.5–8.1)

## 2022-08-10 LAB — BASIC METABOLIC PANEL
Anion gap: 7 (ref 5–15)
BUN: 13 mg/dL (ref 8–23)
CO2: 29 mmol/L (ref 22–32)
Calcium: 8.4 mg/dL — ABNORMAL LOW (ref 8.9–10.3)
Chloride: 102 mmol/L (ref 98–111)
Creatinine, Ser: 0.96 mg/dL (ref 0.44–1.00)
GFR, Estimated: >60 mL/min (ref >60)
Glucose, Bld: 81 mg/dL (ref 70–99)
Potassium: 3.7 mmol/L (ref 3.5–5.1)
Sodium: 138 mmol/L (ref 135–145)

## 2022-08-10 LAB — VALPROIC ACID LEVEL: Valproic Acid Lvl: 38 ug/mL — ABNORMAL LOW (ref 50.0–100.0)

## 2022-08-10 LAB — URINALYSIS, ROUTINE W REFLEX MICROSCOPIC
Bilirubin Urine: NEGATIVE
Glucose, UA: NEGATIVE mg/dL
Ketones, ur: NEGATIVE mg/dL
Nitrite: POSITIVE — AB
Protein, ur: NEGATIVE mg/dL
Specific Gravity, Urine: 1.011 (ref 1.005–1.030)
pH: 5 (ref 5.0–8.0)

## 2022-08-10 LAB — CBC
HCT: 33.9 % — ABNORMAL LOW (ref 36.0–46.0)
Hemoglobin: 10.6 g/dL — ABNORMAL LOW (ref 12.0–15.0)
MCH: 28.6 pg (ref 26.0–34.0)
MCHC: 31.3 g/dL (ref 30.0–36.0)
MCV: 91.4 fL (ref 80.0–100.0)
Platelets: 211 10*3/uL (ref 150–400)
RBC: 3.71 MIL/uL — ABNORMAL LOW (ref 3.87–5.11)
RDW: 13.2 % (ref 11.5–15.5)
WBC: 4.7 10*3/uL (ref 4.0–10.5)
nRBC: 0 % (ref 0.0–0.2)

## 2022-08-10 LAB — PROTIME-INR
INR: 1 (ref 0.8–1.2)
Prothrombin Time: 12.5 seconds (ref 11.4–15.2)

## 2022-08-10 LAB — RAPID URINE DRUG SCREEN, HOSP PERFORMED
Amphetamines: NOT DETECTED
Barbiturates: NOT DETECTED
Benzodiazepines: POSITIVE — AB
Cocaine: NOT DETECTED
Opiates: NOT DETECTED
Tetrahydrocannabinol: NOT DETECTED

## 2022-08-10 LAB — APTT: aPTT: 30 seconds (ref 24–36)

## 2022-08-10 LAB — CBG MONITORING, ED: Glucose-Capillary: 78 mg/dL (ref 70–99)

## 2022-08-10 LAB — MRSA NEXT GEN BY PCR, NASAL: MRSA by PCR Next Gen: DETECTED — AB

## 2022-08-10 LAB — ETHANOL: Alcohol, Ethyl (B): <10 mg/dL (ref <10)

## 2022-08-10 LAB — MAGNESIUM: Magnesium: 1.9 mg/dL (ref 1.7–2.4)

## 2022-08-10 MED ORDER — ENOXAPARIN SODIUM 60 MG/0.6ML IJ SOSY
50.0000 mg | PREFILLED_SYRINGE | INTRAMUSCULAR | Status: DC
  Administered 2022-08-10 – 2022-08-11 (×2): 50 mg via SUBCUTANEOUS
  Filled 2022-08-10 (×2): qty 0.6

## 2022-08-10 MED ORDER — POLYETHYLENE GLYCOL 3350 17 G PO PACK: 17.0000 g | PACK | Freq: Every day | ORAL | Status: DC | PRN

## 2022-08-10 MED ORDER — ACETAMINOPHEN 325 MG PO TABS
650.0000 mg | ORAL_TABLET | Freq: Four times a day (QID) | ORAL | Status: DC | PRN
  Administered 2022-08-11: 650 mg via ORAL
  Filled 2022-08-10: qty 2

## 2022-08-10 MED ORDER — ATORVASTATIN CALCIUM 20 MG PO TABS
20.0000 mg | ORAL_TABLET | Freq: Every day | ORAL | Status: DC
  Administered 2022-08-11 – 2022-08-12 (×2): 20 mg via ORAL
  Filled 2022-08-10 (×2): qty 1

## 2022-08-10 MED ORDER — POTASSIUM CHLORIDE IN NACL 20-0.9 MEQ/L-% IV SOLN: INTRAVENOUS | Status: AC

## 2022-08-10 MED ORDER — DULOXETINE HCL 60 MG PO CPEP
60.0000 mg | ORAL_CAPSULE | Freq: Every day | ORAL | Status: DC
  Administered 2022-08-11 – 2022-08-12 (×2): 60 mg via ORAL
  Filled 2022-08-10 (×2): qty 1

## 2022-08-10 MED ORDER — PANTOPRAZOLE SODIUM 40 MG PO TBEC
40.0000 mg | DELAYED_RELEASE_TABLET | Freq: Every day | ORAL | Status: DC
  Administered 2022-08-11 – 2022-08-12 (×2): 40 mg via ORAL
  Filled 2022-08-10 (×2): qty 1

## 2022-08-10 MED ORDER — TRAZODONE HCL 50 MG PO TABS
50.0000 mg | ORAL_TABLET | Freq: Every day | ORAL | Status: DC
  Administered 2022-08-10 – 2022-08-11 (×2): 50 mg via ORAL
  Filled 2022-08-10 (×2): qty 1

## 2022-08-10 MED ORDER — LISINOPRIL 10 MG PO TABS
10.0000 mg | ORAL_TABLET | Freq: Every day | ORAL | Status: DC
  Administered 2022-08-11: 10 mg via ORAL
  Filled 2022-08-10: qty 1

## 2022-08-10 MED ORDER — ACETAMINOPHEN 650 MG RE SUPP: 650.0000 mg | Freq: Four times a day (QID) | RECTAL | Status: DC | PRN

## 2022-08-10 MED ORDER — SODIUM CHLORIDE 0.9 % IV SOLN
2.0000 g | INTRAVENOUS | Status: DC
  Administered 2022-08-10 – 2022-08-11 (×2): 2 g via INTRAVENOUS
  Filled 2022-08-10 (×2): qty 20

## 2022-08-10 MED ORDER — DIVALPROEX SODIUM 250 MG PO DR TAB
375.0000 mg | DELAYED_RELEASE_TABLET | Freq: Every day | ORAL | Status: DC
  Administered 2022-08-10 – 2022-08-11 (×2): 375 mg via ORAL
  Filled 2022-08-10 (×4): qty 1

## 2022-08-10 MED ORDER — LEVOTHYROXINE SODIUM 100 MCG PO TABS
100.0000 ug | ORAL_TABLET | Freq: Every day | ORAL | Status: DC
  Administered 2022-08-11 – 2022-08-12 (×2): 100 ug via ORAL
  Filled 2022-08-10 (×2): qty 1

## 2022-08-10 NOTE — Progress Notes (Addendum)
1337- Code stroke activated. Pt in CT upon activation.  1339- [facility] paged 74- Dr. Wilford Corner on cart for neuro assessment 1346- Return from CT 1358- Sent for STAT MRI

## 2022-08-10 NOTE — Assessment & Plan Note (Signed)
No seizures in several years- ~ 43yrs.  -Resume Depakote -Check depakote level

## 2022-08-10 NOTE — ED Notes (Signed)
Patient in the room at this time. Neurology on consult.

## 2022-08-10 NOTE — Therapy (Addendum)
OUTPATIENT PHYSICAL THERAPY LOWER EXTREMITY TREATMENT   Patient Name: THANIA DEWAELE MRN: [id number] DOB:[birth year], 71 y.o., female Today's Date: 08/10/2022   PT End of Session - 08/10/22 1112     Visit Number 9    Number of Visits 16    Date for PT Re-Evaluation 08/15/22    Authorization Type [employer] Medicare    Progress Note Due on Visit 10    PT Start Time 1110    PT Stop Time 1155    PT Time Calculation (min) 45 min    Activity Tolerance Patient tolerated treatment well    Behavior During Therapy WFL for tasks assessed/performed                  Past Medical History:  Diagnosis Date   Allergic rhinitis    Anxiety    Arthritis    Cataracts, bilateral    Chronic back pain    Sees pain [facility]   Chronic pain syndrome    Constipation    Depression    Essential hypertension    GERD (gastroesophageal reflux disease)    Headaches, cluster    History of seizures    Hypothyroid    Mania (HCC)    Mixed hyperlipidemia    PTSD (post-traumatic stress disorder)    Right lumbar radiculopathy    Sleep apnea    STOP BANG score=4   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[facility])    Past Surgical History:  Procedure Laterality Date   BACK SURGERY     BILATERAL OOPHORECTOMY     CATARACT EXTRACTION W/PHACO Left 01/04/2016   Procedure: CATARACT EXTRACTION PHACO AND INTRAOCULAR LENS PLACEMENT ([place]);  Surgeon: Rutherford Guys, MD;  Location: [facility];  Service: Ophthalmology;  Laterality: Left;  CDE:4.70   CATARACT EXTRACTION W/PHACO Right 01/18/2016   Procedure: CATARACT EXTRACTION PHACO AND INTRAOCULAR LENS PLACEMENT RIGHT EYE; CDE:  4.66;  Surgeon: Rutherford Guys, MD;  Location: [facility];  Service: Ophthalmology;  Laterality: Right;   CHOLECYSTECTOMY     COLONOSCOPY  10/16/2008   [id number], otherwise normal rectum; pancolonic diverticula the remainder of the colonic mucosa appeared normal. Next TCS due 10/2013.   COLONOSCOPY  06/28/2006   RMR: Internal hemorrhoids. Diminutive  rectal polyps, cold biopsied/ Pancolonic diverticula. Polyps in the right colon removed    COLONOSCOPY WITH PROPOFOL N/A 05/20/2015   RMR: Pancolonic diverticulosis. Redundant colon    COLONOSCOPY WITH PROPOFOL N/A 11/11/2020   Procedure: COLONOSCOPY WITH PROPOFOL;  Surgeon: Daneil Dolin, MD;  [facility];  Service: Endoscopy;  Laterality: N/A;  pm ASA 2   ESOPHAGOGASTRODUODENOSCOPY  10/16/2008   [id number] hiatal hernia otherwise normal esophagus, stomach  D1, D2, status post passage of a 56-French Maloney dilator   ESOPHAGOGASTRODUODENOSCOPY  06/28/2006   [id number] esophagus. Small hiatal hernia. Otherwise normal stomach, D1 and D2, status post passage of a 56 French Maloney dilator   ESOPHAGOGASTRODUODENOSCOPY (EGD) WITH ESOPHAGEAL DILATION N/A 07/14/2013   RMR: Abnormal esophagus of uncertain significance-status post esophageal biospy. small hiatal hernia.    ESOPHAGOGASTRODUODENOSCOPY (EGD) WITH PROPOFOL N/A 05/20/2015   RMR: NORMAL EGD status post maloney dilation    ESOPHAGOGASTRODUODENOSCOPY (EGD) WITH PROPOFOL N/A 12/23/2018   Dr. Gala Romney: normal esophagus, s/p esophageal dilation.    FOOT ARTHRODESIS Right 03/31/2022   Procedure: RIGHT FOOT TARSAL-METATARSAL FUSION;  Surgeon: Newt Minion, MD;  Location: [place];  Service: Orthopedics;  Laterality: Right;   FOOT SURGERY     left foot-   GASTROCNEMIUS RECESSION Right 03/31/2022  Procedure: RIGHT ACHILLES LENGTHENING;  Surgeon: Nadara Mustard, MD;  Location: [facility] OR;  Service: Orthopedics;  Laterality: Right;   INCISION AND DRAINAGE Right 01/03/2013   Procedure: INCISION AND DRAINAGE;  Surgeon: Vickki Hearing, MD;  Location: [facility];  Service: Orthopedics;  Laterality: Right;   KNEE ARTHROSCOPY  01/2012   right   KNEE ARTHROSCOPY WITH LATERAL RELEASE Right 01/03/2013   Procedure: Lateral Release Patella Right Knee;  Surgeon: Vickki Hearing, MD;  Location: [facility];  Service: Orthopedics;  Laterality: Right;   LAPAROSCOPIC LYSIS  OF ADHESIONS N/A 11/12/2019   Procedure: LAPAROSCOPIC LYSIS OF ADHESIONS, extensive; drainage of peritoneal cyst;  Surgeon: Lazaro Arms, MD;  Location: [facility];  Service: Gynecology;  Laterality: N/A;   LUMBAR DISC SURGERY  L4-5   MALONEY DILATION N/A 05/20/2015   Procedure: Elease Hashimoto DILATION;  Surgeon: Corbin Ade, MD;  Location: [facility];  Service: Endoscopy;  Laterality: N/AElease Hashimoto dilator # 54   MALONEY DILATION N/A 12/23/2018   Procedure: Elease Hashimoto DILATION;  Surgeon: Corbin Ade, MD;  [facility];  Service: Endoscopy;  Laterality: N/A;   PARTIAL HYSTERECTOMY     TOTAL KNEE ARTHROPLASTY  07/01/2012   Procedure: TOTAL KNEE ARTHROPLASTY;  Surgeon: Vickki Hearing, MD;  Location: [facility];  Service: Orthopedics;  Laterality: Right;   TOTAL KNEE REVISION Right 01/03/2013   Procedure: Patellaplasty Right Knee;  Surgeon: Vickki Hearing, MD;  Location: [facility];  Service: Orthopedics;  Laterality: Right;   Patient Active Problem List   Diagnosis Date Noted   Primary osteoarthritis of right foot    Syncope 02/18/2020   Prolonged QT interval 02/18/2020   Glucose intolerance 02/18/2020   Normocytic anemia 12/10/2019   Poor appetite 09/24/2019   Abnormal weight loss 09/24/2019   Upper abdominal mass 09/24/2019   Dysphagia 01/30/2019   Simple adnexal cyst greater than 1 cm in diameter in postmenopausal patient 11/08/2018   Abdominal pain 08/09/2015   Dysphagia, pharyngoesophageal phase    H/O adenomatous polyp of colon 05/05/2015   Constipation 02/02/2015   Knee contusion 12/09/2013   Esophageal dysphagia 07/02/2013   Abdominal pain, epigastric 07/02/2013   Chronic pain syndrome 11/28/2012   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   Cellulitis 11/04/2012   Postoperative wound breakdown 11/04/2012   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   Knee pain 08/13/2012   Hypokalemia 08/01/2012   Cellulitis, wound, post-operative 07/30/2012   Degenerative tear of  medial meniscus of right knee 01/12/2012   OA (osteoarthritis) of knee 01/12/2012   Right knee sprain 11/02/2011   Instability of knee joint 03/29/2011   URTICARIA 12/21/2009   OTHER URINARY INCONTINENCE 07/18/2009   FOOT PAIN, LEFT 05/26/2009   DERMATITIS, CHRONIC 10/21/2008   IMPAIRED GLUCOSE TOLERANCE 09/09/2008   Chronic back pain 09/09/2008   Hypothyroidism 02/27/2008   Hyperlipidemia 02/27/2008   Anxiety with depression 02/27/2008   Bipolar I disorder, most recent episode (or current) depressed, unspecified 02/27/2008   HYPERTENSION 02/27/2008   ALLERGIC RHINITIS 02/27/2008   GERD 02/27/2008   HEADACHE 02/27/2008    PCP: Elfredia Nevins MD  REFERRING PROVIDER: Nadara Mustard, MD  Next apt: 08/26/22  REFERRING DIAG: Y04.599H (ICD-10-CM) - Sprain of tarsometatarsal ligament of right foot, subsequent encounter   THERAPY DIAG:  Pain in right ankle and joints of right foot  Difficulty in walking, not elsewhere classified  Rationale for Evaluation and Treatment Rehabilitation  ONSET DATE: 03/31/22  SUBJECTIVE:   SUBJECTIVE STATEMENT:  08/10/22: increased pain today up to 9-10/10 reports she has been moving to a new apartment and this has increased her pain.   Eval: Patient presents with RT foot and ankle pain s/p RT metatarsal fusion on 03/31/20. She was having trouble for several months and they thought it was a sprain. She wore boot for 6 weeks but did not get any better. She was sent for surgery. She is still having trouble walking, still having pain. She says her sx are no better since surgery. She has not had formal therapy for this. She is still taking pain medication and she is walking with a SPC.   PERTINENT HISTORY: Arthritis, HTN, anxiety  PAIN:  Are you having pain? Yes: NPRS scale: 9/10 Pain location: RT dorsal foot down to great toe  Pain description: throbbing, stinging Aggravating factors: standing, walking Relieving factors: meds, soaking in epsom salt    PRECAUTIONS: None  WEIGHT BEARING RESTRICTIONS No  FALLS:  Has patient fallen in last 6 months? Yes. Number of falls 2  LIVING ENVIRONMENT: Lives with: lives with their partner Lives in: House/apartment Stairs: No Has following equipment at home: Single point cane and Environmental consultant - 2 wheeled  OCCUPATION: Retired   PLOF: Independent with basic ADLs  PATIENT GOALS Decrease pain and swelling    OBJECTIVE:   DIAGNOSTIC FINDINGS: NA  PATIENT SURVEYS:  FOTO 45% function  COGNITION:  Overall cognitive status: Within functional limits for tasks assessed     SENSATION: Reports numbness/ tingling in RT foot   EDEMA:  Min/ mod edema in RT foot   PALPATION: Hyper sensitive about incision and top of RT foot  LOWER EXTREMITY ROM:  Active ROM Right eval Left eval  Hip flexion    Hip extension    Hip abduction    Hip adduction    Hip internal rotation    Hip external rotation    Knee flexion    Knee extension    Ankle dorsiflexion -6 4  Ankle plantarflexion 10 18  Ankle inversion  18  Ankle eversion  12   (Blank rows = not tested)  LOWER EXTREMITY MMT:  MMT Right eval Left eval Right 07/19/22  Hip flexion     Hip extension     Hip abduction     Hip adduction     Hip internal rotation     Hip external rotation     Knee flexion     Knee extension     Ankle dorsiflexion 3-  2+ *  Ankle plantarflexion     Ankle inversion 3-  3-*  Ankle eversion 3-  3-*   (Blank rows = not tested)  *=pain     FUNCTIONAL TESTS:  2 minute walk test: 100 feet  GAIT: Distance walked: 100 feet  Assistive device utilized: Single point cane Level of assistance: Modified independence Comments: decreased stride, antalgic, decreased stance on RT    TODAY'S TREATMENT: 08/10/22 STW and man ROM to right foot and ankle and big toe x 10'; no other treatment performed during manual treatment.   [place]ble pick up x 5' Heel slides x 10 on box Dorsiflexion stretch with towel 5 x  10" 4 way RTB 10 x each Ball roll for DF/PF x 2'   08/08/22: Isometric 10x5" pushing into ball BAPS L3 DF/PF; Inv/Ev; CW/CCW RTB 4 way 10x 5" each Towel gastroc stretch 2x 30"  07/28/22 Seated: Heel slides x 2' Bolster roll for DF/PF and Inv/Ev x 2" each Marble pick up  4 way yellow Theraband x 12 each  Manual STW to right foot and ankle t decrease pain and improve tissue mobility x 10'; no other treatment performed during manual treatment.   07/19/22 FOTO 41.5% BAPS L2 5x each DF/PF; Inv/Ev; CW, increased pain with CCW 2MWT 38ft with SPC ROM  MMT  Manual STM, retrograde massage and ankle PROM x 10' Educated on benefits with compression garments for edema control, measurements taken and given handout for ETI.   Educated on [place] for pain and edema control.    07/10/22 Bolster roll f/B and S/S x 2 min each 4 way Tband yellow x 10 reps each Heel slides with towel on box x 2' for dorsiflexion Dorsiflexion stretch with strap 5 x 20" Marble pick up x 3' (able to pick up 1/2 the marbles today)  STM and ankle PROM and great toe flexion/extension x 10' to decrease pain and increase tissue mobility; no other intervention performed during manual treatment    07/06/22 Ankle circles x 20 each way CW and CCW Seated heel slide for dorsiflexion on box x 2' Seated calf stretch with strap 5 x 20" 4 way yellow thera band x 10 each Towel crunches 2' Marble pick up attempt x 3' Rocker board F/B and S/S x 2' each  STM and ankle PROM and great toe flexion/extension x 10' to decrease pain and increase tissue mobility; no other intervention performed during manual treatment   07/04/22 Ankle circle x20 CW/ CCW  Seated calf stretch 3 x 30"  Ankle band 4 way (inv/ev/df/ pf) YTB x20 each  Towel crunch 2 min    Manual STM and scar tissue massage Manual Ankle PROM all planes and great toe flexion/ extension  06/29/22 Review of HEP and goals Seated  Ankle circles CW and CCW x 1'  each Heel slides on 4" step x 2' for dorsiflexion and knee flexion Heel/toe raises on 4" step x 1' Towel crunches x 1'  Towel inversion/eversion x 1' Dorsiflexion stretch with strap 5 x 20" Manual isometrics inv/ev/plantar flexion 5" x 10 each Rocker board F/B and S/S x 1' each  STM to Right foot to decrease pain and improve tissue mobility x 8' manual dorsiflexion stretch 20" x 5; no other treatment performed during manual treatment  Eval Ankle circle Manual RT ankle and great toe PROM flexion/ extension    PATIENT EDUCATION:  Education details: on eval findings, POC and HEP  Person educated: Patient Education method: Explanation Education comprehension: verbalized understanding   HOME EXERCISE PROGRAM: Access Code: [email or web address] URL: [email or web address] 07/04/22 - Seated Ankle Plantar Flexion with Resistance Loop  - 2 x daily - 7 x weekly - 2 sets - 10 reps - Seated Ankle Inversion with Resistance  - 2 x daily - 7 x weekly - 2 sets - 10 reps - Seated Ankle Eversion with Resistance  - 2 x daily - 7 x weekly - 2 sets - 10 reps - Seated Ankle Dorsiflexion with Resistance  - 2 x daily - 7 x weekly - 2 sets - 10 reps  Date: 06/20/2022 Prepared by: [place]  - 3 x daily - 7 x weekly - 3 sets - 10 reps - Ankle Inversion Eversion Towel Slide  - 3 x daily - 7 x weekly - 3 sets - 10 reps - Seated Ankle Circles  - 3 x daily - 7 x weekly - 3 sets - 10 reps - Seated Calf Stretch with Strap  -  3 x daily - 7 x weekly - 1 sets - 4 reps - 30 second hold  ASSESSMENT:  CLINICAL IMPRESSION: Today's session continued to focus on ankle mobility and strengthening. Noted swelling medial right foot and tender, palpable crepitus of medial planter fascia.  She has increased guarding of the foot and ankle with movement today.  Encouraged patient to try self massage of the plantar aspect of the foot with a tennis ball. She continued to ambulate with a  SPC with slower than normal gait speed.  Patient will benefit from continued skilled therapy services to address deficits and promote return to optimal function.   At the end of treatment today as patient was leaving she had a near fall.  Demetria, on of our support reps, was able to assist patient to prevent a fall until PT and a speech therapist could assist the patient to a chair.  Patient reports feeling weak and has some confusion so staff took patient to the ED to be evaluated.   Stated she does HEP ~1x week and doesn't feel she has improved much with therapy.   Pt educated importance of HEP compliance for maximal benefits.  Session focus with ankle mobility and strengthening.  Pt required cueing for full range and hold with isometrics and theraband exercises for maximum contraction with all exercises today.  Pt moves slowly through all exercises.      OBJECTIVE IMPAIRMENTS Abnormal gait, decreased activity tolerance, decreased balance, decreased endurance, decreased mobility, difficulty walking, decreased ROM, decreased strength, hypomobility, increased edema, increased fascial restrictions, impaired flexibility, impaired sensation, improper body mechanics, and pain.   ACTIVITY LIMITATIONS standing, squatting, stairs, transfers, and locomotion level  PARTICIPATION LIMITATIONS: meal prep, cleaning, laundry, driving, shopping, community activity, and yard work  PERSONAL FACTORS Past/current experiences and Time since onset of injury/illness/exacerbation are also affecting patient's functional outcome.   REHAB POTENTIAL: Good  CLINICAL DECISION MAKING: Stable/uncomplicated  EVALUATION COMPLEXITY: Low   GOALS: SHORT TERM GOALS: Target date: 07/18/2022  Patient will be independent with initial HEP and self-management strategies to improve functional outcomes Baseline: 07/19/22:  Reports compliance with HEP daily Goal status: IN PROGRESS   2. Patient will have RT ankle DF AROM at least 5  degrees to improve functional mobility, gait mechanics and stair ambulation.  Baseline: -6; 07/19/22: DF 4 degrees Goal status: IN PROGRESS   LONG TERM GOALS: Target date: 08/15/2022  Patient will be independent with advanced HEP and self-management strategies to improve functional outcomes Baseline: 07/19/22:  Reports compliance with HEP daily with initial exercises, no advanced exercises given. Goal status: IN PROGRESS  2.  Patient will improve FOTO score to predicted value to indicate improvement in functional outcomes Baseline: 45%; 07/19/22: FOTO score 41.5% Goal status: IN PROGRESS  3.  Patient will have RT ankle DF AROM at least 10 degrees to improve functional mobility, gait mechanics and stair ambulation.  Baseline: -6; 07/19/22: DF 4 degrees Goal status: IN PROGRESS  4. Patient will have equal to or > 4/5 MMT throughout RT ankle to improve ability to perform functional mobility, stair ambulation and ADLs.  Baseline: See MMT; 07/19/22: no improvements see above limited by pain Goal status: IN PROGRESS  5. Patient will be able to ambulate at least 300 feet during 2MWT with LRAD to demonstrate improved ability to perform functional mobility and associated tasks. Baseline: 100 feet with SPC; 07/19/22: 2MWT with SPC 80ft limited by pain Goal status: IN PROGRESS   PLAN: PT FREQUENCY: 2x/week  PT DURATION:  8 weeks  PLANNED INTERVENTIONS: Therapeutic exercises, Therapeutic activity, Neuromuscular re-education, Balance training, Gait training, Patient/Family education, Joint manipulation, Joint mobilization, Stair training, Aquatic Therapy, Dry Needling, Electrical stimulation, Spinal manipulation, Spinal mobilization, Cryotherapy, Moist heat, scar mobilization, Taping, Traction, Ultrasound, Biofeedback, Ionotophoresis 4mg /ml Dexamethasone, and Manual therapy.   PLAN FOR NEXT SESSION: Manual Rt ankle PROM and scar tissue massage. Progress ankle strength and mobility as tolerated.  Begin standing ankle strengthening including rocker board, heel/toe raises.  11:57 AM, 08/10/22 Darcey Cardy Small Saber Dickerman MPT [facility] [place] [phone]

## 2022-08-10 NOTE — Assessment & Plan Note (Signed)
Chronic right knee and foot pain. -Hold as needed oxycodone for now

## 2022-08-10 NOTE — ED Notes (Signed)
CODE STROKE activated  

## 2022-08-10 NOTE — ED Notes (Signed)
TTS at this time. 

## 2022-08-10 NOTE — ED Notes (Signed)
Attempted report x1. 

## 2022-08-10 NOTE — Telephone Encounter (Signed)
Call for appt

## 2022-08-10 NOTE — ED Provider Notes (Signed)
[facility] Provider Note   CSN: [id number] Arrival date & time: 08/10/22  1213     History  Chief Complaint  Patient presents with   Weakness    Diane [name] is a 71 y.o. female.  71 year old female presents today for evaluation of lightheadedness episode that she had while she was in rehab facility on [employer].  Reports the appointment was around 1115 this morning.  She states that she noticed she was weaker than usual shortly before her appointment around 11 this morning.  Patient lives with her friend at home.  Does not use any assistive devices to get around.  Denies prior history of stroke.  She is not anticoagulated.  She appears somnolent and is slurring her speech.  She states this is not usual for her.  She did take Xanax this morning.  However she is states she takes Xanax every morning.  She denies chest pain, shortness of breath, palpitations, productive cough, dysuria, abdominal pain, nausea, vomiting.  Currently denies dizziness, or lightheadedness.  The history is provided by the patient. No language interpreter was used.       Home Medications Prior to Admission medications   Medication Sig Start Date End Date Taking? Authorizing Provider  ALPRAZolam Duanne Moron) 0.5 MG tablet TAKE ONE TABLET BY MOUTH THREE TIMES DAILY 08/10/22   Cloria Spring, MD  atorvastatin (LIPITOR) 20 MG tablet Take 20 mg by mouth daily.    [provider]  cyclobenzaprine (FLEXERIL) 10 MG tablet TAKE ONE TABLET BY MOUTH THREE TIMES DAILY AS NEEDED FOR MUSCLE SPASMS 07/27/22   Carole Civil, MD  divalproex (DEPAKOTE) 125 MG DR tablet TAKE THREE (3) TABLETS BY MOUTH AT BEDTIME 06/01/22   Cloria Spring, MD  DULoxetine (CYMBALTA) 60 MG capsule TAKE ONE CAPSULE BY MOUTH DAILY 07/27/22   Cloria Spring, MD  hydrocortisone (ANUSOL-HC) 2.5 % rectal cream Place 1 application rectally 2 (two) times daily. Patient taking differently: Place 1 application   rectally as needed for hemorrhoids. 02/06/18   Annitta Needs, NP  levothyroxine (SYNTHROID) 100 MCG tablet Take 100 mcg by mouth daily before breakfast. 09/27/21   [provider]  lisinopril (PRINIVIL,ZESTRIL) 10 MG tablet Take 10 mg by mouth at bedtime.     [provider]  lubiprostone (AMITIZA) 24 MCG capsule TAKE ONE CAPSULE BY MOUTH TWICE DAILY WITH A MEAL 06/21/22   Mahon, Lenise Arena, NP  meloxicam (MOBIC) 7.5 MG tablet Take 1 tablet (7.5 mg total) by mouth daily. 01/05/22   Carole Civil, MD  Naldemedine Tosylate (SYMPROIC) 0.2 MG TABS Take 0.2 mg by mouth daily.    [provider]  omeprazole (PRILOSEC) 20 MG capsule TAKE ONE CAPSULE BY MOUTH TWICE DAILY BEFORE A MEAL. 05/11/22   Mahala Menghini, PA-C  oxybutynin (DITROPAN-XL) 10 MG 24 hr tablet Take 10 mg by mouth at bedtime.    [provider]  oxyCODONE (OXY IR/ROXICODONE) 5 MG immediate release tablet Take 1 tablet (5 mg total) by mouth 3 (three) times daily as needed. Patient not taking: Reported on 06/21/2022 06/07/22   Suzan Slick, NP  oxyCODONE-acetaminophen (PERCOCET) 7.5-325 MG tablet Take 1 tablet by mouth every 8 (eight) hours as needed for severe pain. 08/07/22   Carole Civil, MD  oxyCODONE-acetaminophen (PERCOCET/ROXICET) 5-325 MG tablet Take 1 tablet by mouth every 8 (eight) hours as needed. 07/27/22   Newt Minion, MD  traZODone (DESYREL) 50 MG tablet Take 1  tablet (50 mg total) by mouth at bedtime. 06/01/22   Cloria Spring, MD      Allergies    Neomycin-bacitracin zn-polymyx, Penicillins, and Sulfonamide derivatives    Review of Systems   Review of Systems  Constitutional:  Negative for chills and fever.  Respiratory:  Negative for cough and shortness of breath.   Cardiovascular:  Negative for chest pain and palpitations.  Gastrointestinal:  Negative for abdominal pain, nausea and vomiting.  Neurological:  Positive for speech difficulty, weakness and light-headedness.  Negative for syncope.  All other systems reviewed and are negative.   Physical Exam Updated Vital Signs BP 105/64   Pulse 61   Temp 97.6 F (36.4 C) (Oral)   Resp 15   Ht 5\' 3"  (1.6 m)   Wt 106.6 kg   SpO2 95%   BMI 41.63 kg/m  Physical Exam Vitals and nursing note reviewed.  Constitutional:      General: She is not in acute distress.    Appearance: Normal appearance. She is not ill-appearing.  HENT:     Head: Normocephalic and atraumatic.     Nose: Nose normal.  Eyes:     General: No scleral icterus.    Extraocular Movements: Extraocular movements intact.     Conjunctiva/sclera: Conjunctivae normal.  Cardiovascular:     Rate and Rhythm: Normal rate and regular rhythm.     Pulses: Normal pulses.  Pulmonary:     Effort: Pulmonary effort is normal. No respiratory distress.     Breath sounds: Normal breath sounds. No wheezing or rales.  Abdominal:     General: There is no distension.     Tenderness: There is no abdominal tenderness.  Musculoskeletal:        General: Normal range of motion.     Cervical back: Normal range of motion.  Skin:    General: Skin is warm and dry.  Neurological:     Mental Status: She is alert and oriented to person, place, and time. Mental status is at baseline.     Comments: Cranial nerves III through XII intact.  Noticeable slurred speech.  Tongue midline.  Without pronator drift.  However generalized tremors noted.  Generalized weakness in bilateral upper and lower extremities slightly worse over the right side in both upper and lower extremities.  Difficulty with heel-to-shin on the right.  Difficulty with finger-nose on the right.  Sensation intact and symmetrical bilaterally.     ED Results / Procedures / Treatments   Labs (all labs ordered are listed, but only abnormal results are displayed) Labs Reviewed  CBC - Abnormal; Notable for the following components:      Result Value   RBC 3.71 (*)    Hemoglobin 10.6 (*)    HCT 33.9 (*)     All other components within normal limits  BASIC METABOLIC PANEL  URINALYSIS, ROUTINE W REFLEX MICROSCOPIC  MAGNESIUM  ETHANOL  PROTIME-INR  APTT  DIFFERENTIAL  COMPREHENSIVE METABOLIC PANEL  ETHANOL  RAPID URINE DRUG SCREEN, HOSP PERFORMED  CBG MONITORING, ED  CBG MONITORING, ED  I-STAT CHEM 8, ED    EKG None  Radiology No results found.  Procedures Procedures    Medications Ordered in ED Medications - No data to display  ED Course/ Medical Decision Making/ A&P Clinical Course as of 08/10/22 1810  Thu Aug 10, 2022  1702 On reevaluation patient still with slurred speech.  Still with significant weakness.  Stood patient to the side of the bed and she  was significantly unsteady.  I did speak to patient's friend that she stays with.  He states that patient at baseline has clear speech, does not have any complaints of weakness and does a decent job with ambulating within the house without any assistive device.  [AA]    Clinical Course User Index [AA] Marita Kansas, PA-C                           Medical Decision Making Amount and/or Complexity of Data Reviewed Labs: ordered. Radiology: ordered.  Risk Decision regarding hospitalization.   Medical Decision Making / ED Course   This patient presents to the ED for concern of lightheadedness, weakness, this involves an extensive number of treatment options, and is a complaint that carries with it a high risk of complications and morbidity.  The differential diagnosis includes CVA, metabolic encephalopathy, ACS, pneumonia, UTI, dehydration, orthostasis  MDM: 71 year old female with past medical history as above presents from outpatient rehab for evaluation of dizziness, weakness, somnolence.  She is on multiple sedating medications.  Given she is within the tPA window code stroke was initiated.  Patient evaluated by neurologist.  Stat MRI was ordered.  No evidence of acute stroke.  Neurology recommends workup to evaluate  for metabolic encephalopathy.  Dr. Wilford Corner does recommend minimizing sedate of medications.  If workup is normal no additional recommendations for inpatient neurological workup.  Patient still has not returned to baseline.  UA does show some leukocytes and nitrites however prior UAs show similar findings.  She is without symptoms of UTI, without leukocytosis, without fever.  Low suspicion for UTI.  Will send urine culture.  Given patient has not returned to baseline will discuss with hospitalist for admission due to toxic metabolic encephalopathy.  Discussed with hospitalist will evaluate patient for admission.  Plan discussed with patient's friend who is at bedside, and patient.  They are both in agreement with plan.   Additional history obtained: -Additional history obtained from friend who is at bedside -External records from outside source obtained and reviewed including: Chart review including previous notes, labs, imaging, consultation notes   Lab Tests: -I ordered, reviewed, and interpreted labs.   The pertinent results include:   Labs Reviewed  BASIC METABOLIC PANEL - Abnormal; Notable for the following components:      Result Value   Calcium 8.4 (*)    All other components within normal limits  CBC - Abnormal; Notable for the following components:   RBC 3.71 (*)    Hemoglobin 10.6 (*)    HCT 33.9 (*)    All other components within normal limits  COMPREHENSIVE METABOLIC PANEL - Abnormal; Notable for the following components:   Creatinine, Ser 1.10 (*)    Calcium 8.4 (*)    Albumin 3.2 (*)    GFR, Estimated 53 (*)    All other components within normal limits  MAGNESIUM  ETHANOL  PROTIME-INR  APTT  DIFFERENTIAL  URINALYSIS, ROUTINE W REFLEX MICROSCOPIC  RAPID URINE DRUG SCREEN, HOSP PERFORMED  CBG MONITORING, ED  CBG MONITORING, ED      EKG  EKG Interpretation  Date/Time:    Ventricular Rate:    PR Interval:    QRS Duration:   QT Interval:    QTC Calculation:   R  Axis:     Text Interpretation:           Imaging Studies ordered: I ordered imaging studies including chest x-ray, CT head, MRI  brain without contrast I independently visualized and interpreted imaging. I agree with the radiologist interpretation   Medicines ordered and prescription drug management: No orders of the defined types were placed in this encounter.   -I have reviewed the patients home medicines and have made adjustments as needed  Reevaluation: After the interventions noted above, I reevaluated the patient and found that they have :stayed the same  Co morbidities that complicate the patient evaluation  Past Medical History:  Diagnosis Date   Allergic rhinitis    Anxiety    Arthritis    Cataracts, bilateral    Chronic back pain    Sees pain mamagement clinic   Chronic pain syndrome    Constipation    Depression    Essential hypertension    GERD (gastroesophageal reflux disease)    Headaches, cluster    History of seizures    Hypothyroid    Mania ([place])    Mixed hyperlipidemia    PTSD (post-traumatic stress disorder)    Right lumbar radiculopathy    Sleep apnea    STOP BANG score=4   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  Final Clinical Impression(s) / ED Diagnoses Final diagnoses:  Toxic metabolic encephalopathy  Weakness  Slurred speech    Rx / DC Orders ED Discharge Orders     None         Evlyn Courier, PA-C 08/10/22 1813    Milton Ferguson, MD 08/12/22 [phone]

## 2022-08-10 NOTE — ED Triage Notes (Signed)
Pt via wheelchair from PT on site. She was in a therapy session for right knee replacement and became dizzy when she stood, approximately 5 mins PTA. Pt states she still feels slightly dizzy now but otherwise feels normal. No obvious neuro deficits noted.

## 2022-08-10 NOTE — Consult Note (Signed)
[facility] Telemedicine Consult   Requesting Provider: Marita Kansas, PA-c Consult Participants: Dr. Marthe Patch, Telespecialist RN Misty   bedside RN Asher Muir Location of the provider: [facility] Location of the patient: [facility] bed 11  This consult was provided via telemedicine with 2-way video and audio communication. The patient/family was informed that care would be provided in this way and agreed to receive care in this manner.   Chief Complaint: Right-sided weakness, slurred speech  HPI: Patient is a 71 year old woman who has a past medical history of anxiety and depression, hypertension, prior history of documented seizures, hyperlipidemia, PTSD, sleep apnea who presented to the emergency room from rehab facility where she was getting rehabilitation for her foot injury where she had sudden onset of generalized weakness, feeling of dizziness and passing out although she did not hit her head or lose consciousness.  Her speech became slurred and she was slow to respond to questions.  She was brought into the emergency room and once the PA evaluated her in triage, he activated a code stroke because he also found her to have some mild right-sided weakness and right-sided ataxia as well. I examined her on the camera and my detailed exam as documented below. Symptoms more concerning for a systemic process than focal neurological deficits but given her history a stat MRI was ordered and reviewed-details below.  She reports no preceding illnesses or sicknesses.  She reports taking all her morning medications which include opiates muscle relaxants and antidepressant.   Past Medical History:  Diagnosis Date   Allergic rhinitis    Anxiety    Arthritis    Cataracts, bilateral    Chronic back pain    Sees pain mamagement clinic   Chronic pain syndrome    Constipation    Depression    Essential hypertension    GERD (gastroesophageal reflux disease)    Headaches, cluster    History  of seizures    Hypothyroid    Mania ([facility])    Mixed hyperlipidemia    PTSD (post-traumatic stress disorder)    Right lumbar radiculopathy    Sleep apnea    STOP BANG score=4   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([facility])     No current facility-administered medications for this encounter.  Current Outpatient Medications:    ALPRAZolam (XANAX) 0.5 MG tablet, TAKE ONE TABLET BY MOUTH THREE TIMES DAILY, Disp: 90 tablet, Rfl: 2   atorvastatin (LIPITOR) 20 MG tablet, Take 20 mg by mouth daily., Disp: , Rfl:    cyclobenzaprine (FLEXERIL) 10 MG tablet, TAKE ONE TABLET BY MOUTH THREE TIMES DAILY AS NEEDED FOR MUSCLE SPASMS, Disp: 90 tablet, Rfl: 5   divalproex (DEPAKOTE) 125 MG DR tablet, TAKE THREE (3) TABLETS BY MOUTH AT BEDTIME, Disp: 90 tablet, Rfl: 2   DULoxetine (CYMBALTA) 60 MG capsule, TAKE ONE CAPSULE BY MOUTH DAILY, Disp: 30 capsule, Rfl: 2   levothyroxine (SYNTHROID) 100 MCG tablet, Take 100 mcg by mouth daily before breakfast., Disp: , Rfl:    lisinopril (PRINIVIL,ZESTRIL) 10 MG tablet, Take 10 mg by mouth at bedtime. , Disp: , Rfl:    omeprazole (PRILOSEC) 20 MG capsule, TAKE ONE CAPSULE BY MOUTH TWICE DAILY BEFORE A MEAL., Disp: 180 capsule, Rfl: 1   oxybutynin (DITROPAN-XL) 10 MG 24 hr tablet, Take 10 mg by mouth at bedtime., Disp: , Rfl:    oxyCODONE-acetaminophen (PERCOCET) 7.5-325 MG tablet, Take 1 tablet by mouth every 8 (eight) hours as needed for severe pain., Disp: 20 tablet, Rfl: 0  traZODone (DESYREL) 50 MG tablet, Take 1 tablet (50 mg total) by mouth at bedtime., Disp: 30 tablet, Rfl: 2   hydrocortisone (ANUSOL-HC) 2.5 % rectal cream, Place 1 application rectally 2 (two) times daily. (Patient not taking: Reported on 08/10/2022), Disp: 30 g, Rfl: 1   lubiprostone (AMITIZA) 24 MCG capsule, TAKE ONE CAPSULE BY MOUTH TWICE DAILY WITH A MEAL (Patient not taking: Reported on 08/10/2022), Disp: 60 capsule, Rfl: 11   meloxicam (MOBIC) 7.5 MG tablet, Take 1 tablet (7.5 mg total) by mouth  daily. (Patient not taking: Reported on 08/10/2022), Disp: 30 tablet, Rfl: 5   Naldemedine Tosylate (SYMPROIC) 0.2 MG TABS, Take 0.2 mg by mouth daily. (Patient not taking: Reported on 08/10/2022), Disp: , Rfl:    oxyCODONE (OXY IR/ROXICODONE) 5 MG immediate release tablet, Take 1 tablet (5 mg total) by mouth 3 (three) times daily as needed. (Patient not taking: Reported on 06/21/2022), Disp: 30 tablet, Rfl: 0   oxyCODONE-acetaminophen (PERCOCET/ROXICET) 5-325 MG tablet, Take 1 tablet by mouth every 8 (eight) hours as needed. (Patient not taking: Reported on 08/10/2022), Disp: 30 tablet, Rfl: 0    LKW: 11 AM TNK given?: No, generalized symptoms which were nonfocal, low suspicion for stroke, eventually MRI negative for stroke Modified Rankin Scale: 2-Slight disability-UNABLE to perform all activities but does not need assistance Time of teleneurologist evaluation: 1342  Exam: Vitals:   08/10/22 1245 08/10/22 1330  BP: 107/63 105/64  Pulse: 64 61  Resp: 15 15  Temp:    SpO2: 98% 95%    General: Well-developed well-nourished woman in no acute distress Neurological exam She is awake alert oriented x 3.  She is slow to respond to questions.  Her speech is slow and she has general psychomotor retardation.  She has no evidence of aphasia.  She has extremely poor attention concentration.  Cranial nerves II through XII intact Motor examination with no drift in bilateral upper extremities or lower extremities.  Sensation intact.  She has ataxia in right upper extremity, left upper extremity, and mild subtle ataxia in the right lower and left lower extremity.  Gait testing was deferred for safety at this time   NIHSS 1A: Level of Consciousness - 0 1B: Ask Month and Age - 0 1C: 'Blink Eyes' & 'Squeeze Hands' - 0 2: Test Horizontal Extraocular Movements - 0 3: Test Visual Fields - 0 4: Test Facial Palsy - 0 5A: Test Left Arm Motor Drift - 0 5B: Test Right Arm Motor Drift - 0 6A: Test Left Leg  Motor Drift - 0 6B: Test Right Leg Motor Drift - 0 7: Test Limb Ataxia - 2 8: Test Sensation - 0 9: Test Language/Aphasia- 0 10: Test Dysarthria - 1 11: Test Extinction/Inattention - 0 NIHSS score: 3   Imaging Reviewed: CT head: No acute changes.  Stat MRI of the brain: No acute stroke.  Labs reviewed in epic and pertinent values follow: CBC    Component Value Date/Time   WBC 4.7 08/10/2022 1309   RBC 3.71 (L) 08/10/2022 1309   HGB 10.6 (L) 08/10/2022 1309   HGB 11.7 05/20/2020 1402   HCT 33.9 (L) 08/10/2022 1309   HCT 36.4 05/20/2020 1402   PLT 211 08/10/2022 1309   PLT 220 05/20/2020 1402   MCV 91.4 08/10/2022 1309   MCV 91 05/20/2020 1402   MCH 28.6 08/10/2022 1309   MCHC 31.3 08/10/2022 1309   RDW 13.2 08/10/2022 1309   RDW 12.6 05/20/2020 1402   LYMPHSABS 3,861 06/30/2020  1432   LYMPHSABS 1.7 05/20/2020 1402   MONOABS 0.7 02/18/2020 1721   EOSABS 9 (L) 06/30/2020 1432   EOSABS 0.0 05/20/2020 1402   BASOSABS 26 06/30/2020 1432   BASOSABS 0.0 05/20/2020 1402   CMP     Component Value Date/Time   NA 138 08/10/2022 1309   K 3.7 08/10/2022 1309   CL 102 08/10/2022 1309   CO2 29 08/10/2022 1309   GLUCOSE 81 08/10/2022 1309   BUN 13 08/10/2022 1309   CREATININE 0.96 08/10/2022 1309   CREATININE 0.78 10/20/2019 1534   CALCIUM 8.4 (L) 08/10/2022 1309   PROT 6.6 02/18/2020 1721   ALBUMIN 3.5 02/18/2020 1721   AST 14 (L) 02/18/2020 1721   ALT 11 02/18/2020 1721   ALKPHOS 45 02/18/2020 1721   BILITOT 0.7 02/18/2020 1721   GFRNONAA >60 08/10/2022 1309   GFRNONAA 69 02/11/2018 1116   GFRAA >60 02/19/2020 0451   GFRAA 80 02/11/2018 1116     Assessment: 71 year old with above past medical history presenting from outpatient rehab for evaluation of dizziness, decreased level of consciousness without fall, and right-sided weakness along with general slowness of movements and responses.  Her exam is nonfocal and is likely to reflect a systemic process rather than a  focal process such as stroke. She is on multiple sedating medications, she took her pain medications, muscle relaxants and antidepressants this morning. Given her risk factors I do not want to miss a stroke as she was still in the window for IV TNKase.  I ordered a stat MRI which I personally reviewed-negative for stroke. Most likely explanation for current presentation is toxic metabolic encephalopathy and polypharmacy  Recommendations:  Minimize sedating medications as much as possible Check UA and chest x-ray to make sure she does not have an underlying infection that makes her more susceptible for a toxic metabolic encephalopathy. If the above are normal, no further inpatient neurological recommendations at this time.  Discussed with EDP: Marita Kansas, PA-c -- Milon Dikes, MD Neurologist [facility] Pager: [phone]

## 2022-08-10 NOTE — Assessment & Plan Note (Addendum)
Mental status has improved on my evaluation.  Was reported somnolent.  Reported slurred speech and right-sided weakness.  Patient has chronic pain to right lower extremity, and reports occasional slurring of some words when talking at baseline.  No prior stroke hx.  Etiology likely toxic from taking all 3 opiates- benzos and muscle relaxants with likely some dehydration from recent diarrhea, and UTI. - Head CT MRI both negative for acute infarct or hemorrhage.   - Code stroke called in the ED. evaluated by Dr. Jerrell Belfast, evaluate for metabolic encephalopathy, no further inpatient neurologic workup needed. -Negative orthostatic vitals - N/s + 20 KCL 100cc/hr x 20 hrs -Hold xanax, oxycodone for now

## 2022-08-10 NOTE — Progress Notes (Signed)
1336 call time 1342 exam started 1343 exam finished 1343 images sent to soc 1345 exam completed in epic 1343 [facility] radiology called

## 2022-08-10 NOTE — ED Notes (Signed)
Patient transported to CT 

## 2022-08-10 NOTE — H&P (Signed)
History and Physical    Diane [name] ZOX:[id number] DOB: October 21, 1950 DOA: 08/10/2022  PCP: Elfredia Nevins, MD   Patient coming from: Home  I have personally briefly reviewed patient's old medical records in [facility]  Chief Complaint: Dizziness, slurred speech  HPI: Diane [name] is a 71 y.o. female with medical history significant for hypertension, dysphagia, chronic pain, anxiety and depression, seizures. Patient presented to the ED from physical therapy via wheelchair reports of dizziness.  She had just completed a PT session and was on the way out, she was standing when she suddenly felt dizzy.  She was brought to the ED, and she was told by staff that her speech was abnormal.  On my evaluation, she has a bit of occasional slurring of her words that is not always present, she reports this is normal for her.  She does not really think her speech is slurred.  Also reports chronic weakness to her right leg with pain to her knees and foot for which she takes chronic pain medications.   Today she took Xanax, oxycodone, muscle relaxant all 3 together which she normally does not do because she wanted to reduce the discomfort/pain she would feel after physical therapy.  She also reports at least 3 loose stools over the past 2 days none today.  No abdominal pain.  She reports urinary frequency at night over the past 2 weeks.  No dysuria. No alcohol intake.  ED Course: Sats on my evaluation 91 to 95% on room air.  Stable vitals. UDS positive for benzos. Code stroke called-as patient was in tPA window.  Head CT and stat MRI ordered, both negative for acute infarct.  Evaluated by teleneurologist Dr. Marianna Payment for metabolic encephalopathy.  No additional recommendations for inpatient neurologic workup.  Review of Systems: As per HPI all other systems reviewed and negative.  Past Medical History:  Diagnosis Date   Allergic rhinitis    Anxiety    Arthritis    Cataracts, bilateral     Chronic back pain    Sees pain mamagement clinic   Chronic pain syndrome    Constipation    Depression    Essential hypertension    GERD (gastroesophageal reflux disease)    Headaches, cluster    History of seizures    Hypothyroid    Mania (HCC)    Mixed hyperlipidemia    PTSD (post-traumatic stress disorder)    Right lumbar radiculopathy    Sleep apnea    STOP BANG score=4   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[facility])     Past Surgical History:  Procedure Laterality Date   BACK SURGERY     BILATERAL OOPHORECTOMY     CATARACT EXTRACTION W/PHACO Left 01/04/2016   Procedure: CATARACT EXTRACTION PHACO AND INTRAOCULAR LENS PLACEMENT (IOC);  Surgeon: Jethro Bolus, MD;  Location: [facility];  Service: Ophthalmology;  Laterality: Left;  CDE:4.70   CATARACT EXTRACTION W/PHACO Right 01/18/2016   Procedure: CATARACT EXTRACTION PHACO AND INTRAOCULAR LENS PLACEMENT RIGHT EYE; CDE:  4.66;  Surgeon: Jethro Bolus, MD;  Location: [facility];  Service: Ophthalmology;  Laterality: Right;   CHOLECYSTECTOMY     COLONOSCOPY  10/16/2008   WJX:[id number], otherwise normal rectum; pancolonic diverticula the remainder of the colonic mucosa appeared normal. Next TCS due 10/2013.   COLONOSCOPY  06/28/2006   RMR: Internal hemorrhoids. Diminutive rectal polyps, cold biopsied/ Pancolonic diverticula. Polyps in the right colon removed    COLONOSCOPY WITH PROPOFOL N/A 05/20/2015   RMR: Pancolonic diverticulosis. Redundant  colon    COLONOSCOPY WITH PROPOFOL N/A 11/11/2020   Procedure: COLONOSCOPY WITH PROPOFOL;  Surgeon: Corbin Ade, MD;  Location: [facility];  Service: Endoscopy;  Laterality: N/A;  pm ASA 2   ESOPHAGOGASTRODUODENOSCOPY  10/16/2008   EHU:DJSHF hiatal hernia otherwise normal esophagus, stomach  D1, D2, status post passage of a 56-French Maloney dilator   ESOPHAGOGASTRODUODENOSCOPY  06/28/2006   WYO:VZCHYI esophagus. Small hiatal hernia. Otherwise normal stomach, D1 and D2, status post passage of a 56 French  Maloney dilator   ESOPHAGOGASTRODUODENOSCOPY (EGD) WITH ESOPHAGEAL DILATION N/A 07/14/2013   RMR: Abnormal esophagus of uncertain significance-status post esophageal biospy. small hiatal hernia.    ESOPHAGOGASTRODUODENOSCOPY (EGD) WITH PROPOFOL N/A 05/20/2015   RMR: NORMAL EGD status post maloney dilation    ESOPHAGOGASTRODUODENOSCOPY (EGD) WITH PROPOFOL N/A 12/23/2018   Dr. Jena Gauss: normal esophagus, s/p esophageal dilation.    FOOT ARTHRODESIS Right 03/31/2022   Procedure: RIGHT FOOT TARSAL-METATARSAL FUSION;  Surgeon: Nadara Mustard, MD;  Location: [facility] OR;  Service: Orthopedics;  Laterality: Right;   FOOT SURGERY     left foot-   GASTROCNEMIUS RECESSION Right 03/31/2022   Procedure: RIGHT ACHILLES LENGTHENING;  Surgeon: Nadara Mustard, MD;  Location: [facility] OR;  Service: Orthopedics;  Laterality: Right;   INCISION AND DRAINAGE Right 01/03/2013   Procedure: INCISION AND DRAINAGE;  Surgeon: Vickki Hearing, MD;  Location: [facility];  Service: Orthopedics;  Laterality: Right;   KNEE ARTHROSCOPY  01/2012   right   KNEE ARTHROSCOPY WITH LATERAL RELEASE Right 01/03/2013   Procedure: Lateral Release Patella Right Knee;  Surgeon: Vickki Hearing, MD;  Location: [facility];  Service: Orthopedics;  Laterality: Right;   LAPAROSCOPIC LYSIS OF ADHESIONS N/A 11/12/2019   Procedure: LAPAROSCOPIC LYSIS OF ADHESIONS, extensive; drainage of peritoneal cyst;  Surgeon: Lazaro Arms, MD;  Location: [facility];  Service: Gynecology;  Laterality: N/A;   LUMBAR DISC SURGERY  L4-5   MALONEY DILATION N/A 05/20/2015   Procedure: Elease Hashimoto DILATION;  Surgeon: Corbin Ade, MD;  Location: [facility];  Service: Endoscopy;  Laterality: N/AElease Hashimoto dilator # 54   MALONEY DILATION N/A 12/23/2018   Procedure: Elease Hashimoto DILATION;  Surgeon: Corbin Ade, MD;  [facility];  Service: Endoscopy;  Laterality: N/A;   PARTIAL HYSTERECTOMY     TOTAL KNEE ARTHROPLASTY  07/01/2012   Procedure: TOTAL KNEE ARTHROPLASTY;  Surgeon: Vickki Hearing, MD;  Location: [facility];  Service: Orthopedics;  Laterality: Right;   TOTAL KNEE REVISION Right 01/03/2013   Procedure: Patellaplasty Right Knee;  Surgeon: Vickki Hearing, MD;  Location: [facility];  Service: Orthopedics;  Laterality: Right;     reports that she quit smoking about 19 years ago. Her smoking use included cigarettes. She has a 1.25 pack-year smoking history. She has never used smokeless tobacco. She reports that she does not currently use alcohol. She reports that she does not currently use drugs.  Allergies  Allergen Reactions   Neomycin-Bacitracin Zn-Polymyx Other (See Comments)    Reaction: skin starts to become raw and peels off   Penicillins Hives   Sulfonamide Derivatives Hives and Swelling    Family History  Problem Relation Age of Onset   Alcohol abuse Father    Anxiety disorder Father    Alcohol abuse Mother    Anxiety disorder Mother    Alcohol abuse Brother    Anxiety disorder Brother    Drug abuse Brother    Drug abuse Brother  Alcohol abuse Brother    Anxiety disorder Brother    Seizures Brother    Alcohol abuse Brother    Anxiety disorder Brother    Seizures Brother    Alcohol abuse Brother    Anxiety disorder Brother    Alcohol abuse Brother    Anxiety disorder Brother    Breast cancer Sister    Bipolar disorder Sister    Breast cancer Sister    Dementia Paternal Aunt    ADD / ADHD Grandchild    ADD / ADHD Grandchild    Heart disease Other    Diabetes Other    Alcohol abuse Other    Thyroid disease Son    Thyroid disease Daughter    Depression Neg Hx    OCD Neg Hx    Paranoid behavior Neg Hx    Schizophrenia Neg Hx    Sexual abuse Neg Hx    Physical abuse Neg Hx    Colon cancer Neg Hx    Prior to Admission medications   Medication Sig Start Date End Date Taking? Authorizing Provider  ALPRAZolam Prudy Feeler) 0.5 MG tablet TAKE ONE TABLET BY MOUTH THREE TIMES DAILY 08/10/22  Yes Myrlene Broker, MD  atorvastatin (LIPITOR) 20 MG  tablet Take 20 mg by mouth daily.   Yes [provider]  cyclobenzaprine (FLEXERIL) 10 MG tablet TAKE ONE TABLET BY MOUTH THREE TIMES DAILY AS NEEDED FOR MUSCLE SPASMS 07/27/22  Yes Vickki Hearing, MD  divalproex (DEPAKOTE) 125 MG DR tablet TAKE THREE (3) TABLETS BY MOUTH AT BEDTIME 06/01/22  Yes Myrlene Broker, MD  DULoxetine (CYMBALTA) 60 MG capsule TAKE ONE CAPSULE BY MOUTH DAILY 07/27/22  Yes Myrlene Broker, MD  levothyroxine (SYNTHROID) 100 MCG tablet Take 100 mcg by mouth daily before breakfast. 09/27/21  Yes [provider]  lisinopril (PRINIVIL,ZESTRIL) 10 MG tablet Take 10 mg by mouth at bedtime.    Yes [provider]  omeprazole (PRILOSEC) 20 MG capsule TAKE ONE CAPSULE BY MOUTH TWICE DAILY BEFORE A MEAL. 05/11/22  Yes Tiffany Kocher, PA-C  oxybutynin (DITROPAN-XL) 10 MG 24 hr tablet Take 10 mg by mouth at bedtime.   Yes [provider]  oxyCODONE-acetaminophen (PERCOCET) 7.5-325 MG tablet Take 1 tablet by mouth every 8 (eight) hours as needed for severe pain. 08/07/22  Yes Vickki Hearing, MD  traZODone (DESYREL) 50 MG tablet Take 1 tablet (50 mg total) by mouth at bedtime. 06/01/22  Yes Myrlene Broker, MD  hydrocortisone (ANUSOL-HC) 2.5 % rectal cream Place 1 application rectally 2 (two) times daily. Patient not taking: Reported on 08/10/2022 02/06/18   Gelene Mink, NP  lubiprostone (AMITIZA) 24 MCG capsule TAKE ONE CAPSULE BY MOUTH TWICE DAILY WITH A MEAL Patient not taking: Reported on 08/10/2022 06/21/22   Aida Raider, NP  meloxicam (MOBIC) 7.5 MG tablet Take 1 tablet (7.5 mg total) by mouth daily. Patient not taking: Reported on 08/10/2022 01/05/22   Vickki Hearing, MD  Naldemedine Tosylate (SYMPROIC) 0.2 MG TABS Take 0.2 mg by mouth daily. Patient not taking: Reported on 08/10/2022    [provider]  oxyCODONE (OXY IR/ROXICODONE) 5 MG immediate release tablet Take 1 tablet (5 mg total) by mouth 3 (three) times daily  as needed. Patient not taking: Reported on 06/21/2022 06/07/22   Adonis Huguenin, NP  oxyCODONE-acetaminophen (PERCOCET/ROXICET) 5-325 MG tablet Take 1 tablet by mouth every 8 (eight) hours as needed. Patient not taking: Reported on 08/10/2022 07/27/22  Nadara Mustard, MD    Physical Exam: Vitals:   08/10/22 1600 08/10/22 1644 08/10/22 1700 08/10/22 1800  BP: 128/72  133/71 135/74  Pulse: 62  61 61  Resp: 16  14 15   Temp:  97.8 F (36.6 C)    TempSrc:  Oral    SpO2: 92%  95% 95%  Weight:      Height:        Constitutional: NAD, calm, comfortable Vitals:   08/10/22 1600 08/10/22 1644 08/10/22 1700 08/10/22 1800  BP: 128/72  133/71 135/74  Pulse: 62  61 61  Resp: 16  14 15   Temp:  97.8 F (36.6 C)    TempSrc:  Oral    SpO2: 92%  95% 95%  Weight:      Height:       Eyes: PERRL, lids and conjunctivae normal ENMT: Mucous membranes are mildly dry.  Neck: normal, supple, no masses, no thyromegaly Respiratory: clear to auscultation bilaterally, no wheezing, no crackles. Normal respiratory effort. No accessory muscle use.  Cardiovascular: Regular rate and rhythm, no murmurs / rubs / gallops. No extremity edema.  Extremities warm. Abdomen: no tenderness, no masses palpated. No hepatosplenomegaly. Bowel sounds positive.  Musculoskeletal: no clubbing / cyanosis. No joint deformity upper and lower extremities. Skin: no rashes, lesions, ulcers. No induration Neurologic:  Neurological:     Mental Status: She is alert.     GCS: GCS eye subscore is 4. GCS verbal subscore is 5. GCS motor subscore is 6.     Comments: Mental Status:  Alert, oriented, thought content appropriate, able to give a coherent history. Speech fluent without evidence of aphasia-occasional slurring of a couple of words when talking, patient states this is baseline for her. Able to follow 2 step commands without difficulty.  Cranial Nerves:  II:  Peripheral visual fields grossly normal, pupils equal, round, reactive  to light III,IV, VI: ptosis not present, extra-ocular motions intact bilaterally  V,VII: smile symmetric, eyebrows raise symmetric, facial light touch sensation equal VIII: hearing grossly normal to voice  X:   XI:  XII: midline tongue extension without fassiculations Motor:  Normal tone.  4 + /5 strength to bilateral upper and lower extremities, with good grip strength bilaterally Sensory: Sensation intact to light touch in all extremities.  Cerebellar: Equivocal finger-to-nose with bilateral upper extremities-slow with ocassional tremors but to bilateral upper extremities. CV: distal pulses palpable throughout   Psychiatric: Normal judgment and insight. Alert and oriented x 3. Normal mood.   Labs on Admission: I have personally reviewed following labs and imaging studies  CBC: Recent Labs  Lab 08/10/22 1309 08/10/22 1318  WBC 4.7  --   NEUTROABS  --  1.8  HGB 10.6*  --   HCT 33.9*  --   MCV 91.4  --   PLT 211  --    Basic Metabolic Panel: Recent Labs  Lab 08/10/22 1308 08/10/22 1309 08/10/22 1318  NA  --  138 138  K  --  3.7 4.4  CL  --  102 101  CO2  --  29 30  GLUCOSE  --  81 87  BUN  --  13 14  CREATININE  --  0.96 1.10*  CALCIUM  --  8.4* 8.4*  MG 1.9  --   --    GFR: Estimated Creatinine Clearance: 54.1 mL/min (A) (by C-G formula based on SCr of 1.1 mg/dL (H)). Liver Function Tests: Recent Labs  Lab 08/10/22 1318  AST 21  ALT 14  ALKPHOS 46  BILITOT 0.7  PROT 6.5  ALBUMIN 3.2*   Coagulation Profile: Recent Labs  Lab 08/10/22 1318  INR 1.0   CBG: Recent Labs  Lab 08/10/22 1249  GLUCAP 78    Radiological Exams on Admission: DG Chest Portable 1 View  Result Date: 08/10/2022 CLINICAL DATA:  Evaluate for pneumonia. Patient states she does not feel right. EXAM: PORTABLE CHEST 1 VIEW COMPARISON:  Two-view chest x-ray 09/06/2021 FINDINGS: Low lung volumes exaggerate heart size. Mild pulmonary vascular congestion is present. No edema or  effusion is present. No focal airspace disease is present. Visualized soft tissues and bony thorax are unremarkable. IMPRESSION: Low lung volumes and mild pulmonary vascular congestion. Electronically Signed   By: Marin Robertshristopher  Mattern M.D.   On: 08/10/2022 15:21   MR BRAIN WO CONTRAST  Result Date: 08/10/2022 CLINICAL DATA:  Stroke suspected EXAM: MRI HEAD WITHOUT CONTRAST TECHNIQUE: Multiplanar, multiecho pulse sequences of the brain and surrounding structures were obtained without intravenous contrast. COMPARISON:  Same-day CT brain FINDINGS: Brain: No acute infarction, hemorrhage, hydrocephalus, extra-axial collection or mass lesion. Vascular: Normal flow voids. Skull and upper cervical spine: Normal marrow signal. Sinuses/Orbits: Bilateral lens replacement. Paranasal sinuses are clear. No mastoid effusion. Other: None IMPRESSION: Negative for acute infarct.  No hemorrhage. Electronically Signed   By: Lorenza CambridgeHemant  Desai M.D.   On: 08/10/2022 14:34   CT HEAD CODE STROKE WO CONTRAST  Result Date: 08/10/2022 CLINICAL DATA:  Code stroke.  Dizziness/lightheadedness. EXAM: CT HEAD WITHOUT CONTRAST TECHNIQUE: Contiguous axial images were obtained from the base of the skull through the vertex without intravenous contrast. RADIATION DOSE REDUCTION: This exam was performed according to the departmental dose-optimization program which includes automated exposure control, adjustment of the mA and/or kV according to patient size and/or use of iterative reconstruction technique. COMPARISON:  Brain MRI 02/18/2020 FINDINGS: Brain: There is no acute intracranial hemorrhage, extra-axial fluid collection, or acute infarct. Parenchymal volume is within expected limits for age. The ventricles are normal in size. Gray-white differentiation is preserved. There is no mass lesion.  There is no mass effect or midline shift. Vascular: No hyperdense vessel or unexpected calcification. Skull: Normal. Negative for fracture or focal  lesion. Sinuses/Orbits: The imaged paranasal sinuses are clear. Bilateral lens implants are in place. The globes and orbits are otherwise unremarkable. Other: None. ASPECTS [facility]([place] Stroke Program Early CT Score) - Ganglionic level infarction (caudate, lentiform nuclei, internal capsule, insula, M1-M3 cortex): 7 - Supraganglionic infarction (M4-M6 cortex): 3 Total score (0-10 with 10 being normal): 10 IMPRESSION: 1. No acute intracranial pathology. 2. ASPECTS is 10 These results were called by telephone at the time of interpretation on 08/10/2022 at 1:53 pm to provider Dr Estell HarpinZammit, who verbally acknowledged these results. Electronically Signed   By: Lesia HausenPeter  Noone M.D.   On: 08/10/2022 13:55    EKG: Independently reviewed.  Sinus rhythm, rate 65, QTc 444.  No ST or T wave change from prior.  Assessment/Plan Principal Problem:   Acute metabolic encephalopathy Active Problems:   UTI (urinary tract infection)   Hypothyroidism   Bipolar I disorder, most recent episode depressed (HCC)   Essential hypertension   Chronic pain syndrome   Seizure (HCC)   Assessment and Plan: * Acute metabolic encephalopathy Mental status has improved on my evaluation.  Was reported somnolent.  Reported slurred speech and right-sided weakness.  Patient has chronic pain to right lower extremity, and reports occasional slurring of some words when talking at baseline.  No prior stroke  hx.  Etiology likely toxic from taking all 3 opiates- benzos and muscle relaxants with likely some dehydration from recent diarrhea, and UTI. - Head CT MRI both negative for acute infarct or hemorrhage.   - Code stroke called in the ED. evaluated by Dr. Jerrell Belfast, evaluate for metabolic encephalopathy, no further inpatient neurologic workup needed. -Negative orthostatic vitals - N/s + 20 KCL 100cc/hr x 20 hrs -Hold xanax, oxycodone for now  UTI (urinary tract infection) UA suggestive of UTI with positive nitrites and trace leukocytes, she reports  urinary frequency at night over the past 2 weeks, which she was started on oxybutynin yesterday.  She is afebrile without leukocytosis.  Rules out for sepsis.  Last urine cultures are from 2020- pansensitive E. coli. -IV ceftriaxone -Follow-up urine cultures  Seizure (HCC) No seizures in several years- ~ 53yrs.  -Resume Depakote -Check depakote level  Chronic pain syndrome Chronic right knee and foot pain. -Hold as needed oxycodone for now  Essential hypertension Stable. -Resume lisinopril in a.m.  Hypothyroidism Resume Synthroid   DVT prophylaxis: Lovenox Code Status: FULL Family Communication: None at bedside Disposition Plan: ~ 1 day Consults called: None Admission status:  Obs tele   Author: Onnie Boer, MD 08/10/2022 7:13 PM  For on call review [email or web address].

## 2022-08-10 NOTE — Assessment & Plan Note (Signed)
Stable. -Resume lisinopril in a.m. 

## 2022-08-10 NOTE — Assessment & Plan Note (Addendum)
UA suggestive of UTI with positive nitrites and trace leukocytes, she reports urinary frequency at night over the past 2 weeks, which she was started on oxybutynin yesterday.  She is afebrile without leukocytosis.  Rules out for sepsis.  Last urine cultures are from 2020- pansensitive E. coli. -IV ceftriaxone -Follow-up urine cultures

## 2022-08-10 NOTE — ED Notes (Signed)
Pt taken to MRI with Viann Fish, RN at bedside.

## 2022-08-10 NOTE — Assessment & Plan Note (Signed)
Resume Synthroid. 

## 2022-08-11 DIAGNOSIS — G928 Other toxic encephalopathy: Secondary | ICD-10-CM | POA: Diagnosis not present

## 2022-08-11 MED ORDER — ALPRAZOLAM 0.5 MG PO TABS
0.5000 mg | ORAL_TABLET | Freq: Three times a day (TID) | ORAL | Status: DC
  Administered 2022-08-11 – 2022-08-12 (×4): 0.5 mg via ORAL
  Filled 2022-08-11 (×4): qty 1

## 2022-08-11 MED ORDER — OXYCODONE-ACETAMINOPHEN 7.5-325 MG PO TABS
1.0000 | ORAL_TABLET | Freq: Three times a day (TID) | ORAL | Status: DC | PRN
  Administered 2022-08-11 – 2022-08-12 (×3): 1 via ORAL
  Filled 2022-08-11 (×3): qty 1

## 2022-08-11 NOTE — Care Management Obs Status (Signed)
MEDICARE OBSERVATION STATUS NOTIFICATION   Patient Details  Name: Diane [name] MRN: [id number] Date of Birth: 12/06/1950   Medicare Observation Status Notification Given:  Yes    Corey Harold 08/11/2022, 3:50 PM

## 2022-08-11 NOTE — TOC Progression Note (Signed)
  Transition of Care [facility]) Screening Note   Patient Details  Name: Diane [name] Date of Birth: Apr 08, 1951   [facility]) CM/SW Contact:    Elliot Gault, LCSW Phone Number: 08/11/2022, 11:27 AM    Transition of Care Department [facility]) has reviewed patient and no TOC needs have been identified at this time. We will continue to monitor patient advancement through interdisciplinary progression rounds. If new patient transition needs arise, please place a TOC consult.

## 2022-08-11 NOTE — Progress Notes (Addendum)
PROGRESS NOTE   Diane [name]  WUJ:[id number] DOB: 1951-08-28 DOA: 08/10/2022 PCP: Elfredia Nevins, MD    Level of care: Telemetry  Brief Admission History:   Diane [name] is a 71yo female with history of HTN, dysphagia, chronic pain, anxiety, depression, and seizures who presented to the ED on 08/10/22 for dizziness. She came straight from her PT session. ED staff believed she had slurred speech, but she later reported the way she was speaking was normal for her. She has chronic weakness in her right leg and foot and is on chronic pain therapy. Before her dizziness, she took Xanax, oxycodone, and her muscle relaxant at the same time, which she does not normally do, due to the pain from PT.  In the ED, Code stroke was called, CT and MRI showed no thrombus or bleed. Teleneurologist recommended hospitalization and workup for metabolic encephalopathy. UA potentially suggestive of UTI with positive nitrites and trace leukocytes. She was admitted for further workup.   Assessment and Plan:  Acute Metabolic Encephalopathy Secondary to UTI -Head CT and MRI showed no acute processes -Evaluated for stroke in ED, no further inpatient neurologic workup needed -Negative orthostatics -BMP showed no electrolyte abnormalities, Corrected Ca for low albumin is 9.0 -UA results and Urine cultures showing growth of gram negative rods leads [place] to believe in UTI as the cause.  --This also explains her elevated Creatinine (1.10) and decreased GFR (53). -BP stable and WBC 4.7-Rule out sepsis -Cannot rule out some connection to taking her medications together before incident.  -continue Rocephin while awaiting susceptibilities.   Seizure-remote history -No seizures in several years -Continue home Depakote -Depakote level subtherapeutic at 38, consider adjusting  Chronic Pain Syndrome -restart home xanax 0.5mg  TID -restart home percocet for pain 1 tab q8h PRN  Essential HTN -Continue Lisinopril -Continue  Lipitor  Hypothyroidism -Continue Synthroid  Depression with Anxiety -Continue Cymbalta -Continue trazodone  Obesity -BMI 37.59 -Counsel on diet and lifestyle changes.   DVT prophylaxis: Lovenox Code Status: Full Family Communication: None Disposition: Status is: Observation The patient remains OBS appropriate and will d/c before 2 midnights.   Consultants:  None Procedures:  None Antimicrobials:  Rocephin  Subjective: Diane [name] was seen lying in bed this AM. She complained of a headache and pain in her feet, which is chronic. She had some LLQ pain on exam, which she states she has had before and has a history of diverticula. She denies further complaints Objective: Vitals:   08/10/22 2011 08/11/22 0013 08/11/22 0410 08/11/22 0700  BP: (!) 151/81 134/62 120/62 128/70  Pulse: 61 64 76 65  Resp: 14 16 16 18   Temp: (!) 97.5 F (36.4 C) (!) 97.4 F (36.3 C) 97.6 F (36.4 C) 97.7 F (36.5 C)  TempSrc: Oral Oral Oral Oral  SpO2: 100% 96% 94% 95%  Weight: 96.3 kg     Height:        Intake/Output Summary (Last 24 hours) at 08/11/2022 1332 Last data filed at 08/11/2022 0300 Gross per 24 hour  Intake 660.74 ml  Output --  Net 660.74 ml   Filed Weights   08/10/22 1235 08/10/22 2000 08/10/22 2011  Weight: 106.6 kg 96.5 kg 96.3 kg   Examination:  General exam: Appears calm and comfortable  Respiratory system: Clear to auscultation. Respiratory effort normal. Cardiovascular system: normal S1 & S2 heard. No JVD, murmurs, rubs, gallops or clicks. No pedal edema. Gastrointestinal system: Abdomen is nondistended, soft and mildly tender in the LLQ. No organomegaly or  masses felt. Normal bowel sounds heard. Central nervous system: Alert and oriented. No focal neurological deficits. Extremities: R foot pain and scar on R foot from recent surgery Skin: No rashes, lesions or ulcers. Psychiatry: Judgement and insight appear normal. Mood & affect appropriate.   Data Reviewed:  I have personally reviewed following labs and imaging studies  CBC:    Latest Ref Rng & Units 08/10/2022    1:09 PM 03/31/2022    9:37 AM 09/06/2021    3:19 PM  CBC  WBC 4.0 - 10.5 K/uL 4.7  5.2  6.0   Hemoglobin 12.0 - 15.0 g/dL 86.510.6  78.412.1  69.611.8   Hematocrit 36.0 - 46.0 % 33.9  39.7  37.6   Platelets 150 - 400 K/uL 211  158  182      Basic Metabolic Panel:    Latest Ref Rng & Units 08/10/2022    1:18 PM 08/10/2022    1:09 PM 03/31/2022    9:37 AM  BMP  Glucose 70 - 99 mg/dL 87  81  96   BUN 8 - 23 mg/dL 14  13  11    Creatinine 0.44 - 1.00 mg/dL 2.951.10  2.840.96  1.321.10   Sodium 135 - 145 mmol/L 138  138  139   Potassium 3.5 - 5.1 mmol/L 4.4  3.7  4.4   Chloride 98 - 111 mmol/L 101  102  107   CO2 22 - 32 mmol/L 30  29  24    Calcium 8.9 - 10.3 mg/dL 8.4  8.4  8.5      CBG: Recent Labs  Lab 08/10/22 1249  GLUCAP 78    Recent Results (from the past 240 hour(s))  Urine Culture     Status: Abnormal (Preliminary result)   Collection Time: 08/10/22  5:25 PM   Specimen: Urine, Clean Catch  Result Value Ref Range Status   Specimen Description   Final    URINE, CLEAN CATCH Performed at [facility], [address] [place], [place] [address]    Special Requests   Final    NONE Performed at [facility], [address] [place], [place] [address]    Culture (A)  Final    >=100,000 COLONIES/mL ESCHERICHIA COLI 50,000 COLONIES/mL KLEBSIELLA PNEUMONIAE CULTURE REINCUBATED FOR BETTER GROWTH Performed at [facility], [address] [place], [place] [address]    Report Status PENDING  Incomplete  MRSA Next Gen by PCR, Nasal     Status: Abnormal   Collection Time: 08/10/22  8:25 PM   Specimen: Nasal Mucosa; Nasal Swab  Result Value Ref Range Status   MRSA by PCR Next Gen DETECTED (A) NOT DETECTED Final    Comment: RESULT CALLED TO, READ BACK BY AND VERIFIED WITH: VILLALOBOS,S ON 08/10/22 AT 2250 BY LOY,C (NOTE) The GeneXpert MRSA Assay (FDA approved for NASAL specimens  only), is one component of a comprehensive MRSA colonization surveillance program. It is not intended to diagnose MRSA infection nor to guide or monitor treatment for MRSA infections. Test performance is not FDA approved in patients less than 773 years old. Performed at [facility], [address] [place], [place] [address]      Radiology Studies: DG Chest Portable 1 View  Result Date: 08/10/2022 CLINICAL DATA:  Evaluate for pneumonia. Patient states she does not feel right. EXAM: PORTABLE CHEST 1 VIEW COMPARISON:  Two-view chest x-ray 09/06/2021 FINDINGS: Low lung volumes exaggerate heart size. Mild pulmonary vascular congestion is present. No edema or effusion is  present. No focal airspace disease is present. Visualized soft tissues and bony thorax are unremarkable. IMPRESSION: Low lung volumes and mild pulmonary vascular congestion. Electronically Signed   By: Marin Roberts M.D.   On: 08/10/2022 15:21   MR BRAIN WO CONTRAST  Result Date: 08/10/2022 CLINICAL DATA:  Stroke suspected EXAM: MRI HEAD WITHOUT CONTRAST TECHNIQUE: Multiplanar, multiecho pulse sequences of the brain and surrounding structures were obtained without intravenous contrast. COMPARISON:  Same-day CT brain FINDINGS: Brain: No acute infarction, hemorrhage, hydrocephalus, extra-axial collection or mass lesion. Vascular: Normal flow voids. Skull and upper cervical spine: Normal marrow signal. Sinuses/Orbits: Bilateral lens replacement. Paranasal sinuses are clear. No mastoid effusion. Other: None IMPRESSION: Negative for acute infarct.  No hemorrhage. Electronically Signed   By: Lorenza Cambridge M.D.   On: 08/10/2022 14:34   CT HEAD CODE STROKE WO CONTRAST  Result Date: 08/10/2022 CLINICAL DATA:  Code stroke.  Dizziness/lightheadedness. EXAM: CT HEAD WITHOUT CONTRAST TECHNIQUE: Contiguous axial images were obtained from the base of the skull through the vertex without intravenous contrast. RADIATION DOSE REDUCTION: This  exam was performed according to the departmental dose-optimization program which includes automated exposure control, adjustment of the mA and/or kV according to patient size and/or use of iterative reconstruction technique. COMPARISON:  Brain MRI 02/18/2020 FINDINGS: Brain: There is no acute intracranial hemorrhage, extra-axial fluid collection, or acute infarct. Parenchymal volume is within expected limits for age. The ventricles are normal in size. Gray-white differentiation is preserved. There is no mass lesion.  There is no mass effect or midline shift. Vascular: No hyperdense vessel or unexpected calcification. Skull: Normal. Negative for fracture or focal lesion. Sinuses/Orbits: The imaged paranasal sinuses are clear. Bilateral lens implants are in place. The globes and orbits are otherwise unremarkable. Other: None. ASPECTS [facility] Stroke Program Early CT Score) - Ganglionic level infarction (caudate, lentiform nuclei, internal capsule, insula, M1-M3 cortex): 7 - Supraganglionic infarction (M4-M6 cortex): 3 Total score (0-10 with 10 being normal): 10 IMPRESSION: 1. No acute intracranial pathology. 2. ASPECTS is 10 These results were called by telephone at the time of interpretation on 08/10/2022 at 1:53 pm to provider Dr Estell Harpin, who verbally acknowledged these results. Electronically Signed   By: Lesia Hausen M.D.   On: 08/10/2022 13:55    Scheduled Meds:  ALPRAZolam  0.5 mg Oral TID   atorvastatin  20 mg Oral Daily   divalproex  375 mg Oral QHS   DULoxetine  60 mg Oral Daily   enoxaparin (LOVENOX) injection  50 mg Subcutaneous Q24H   levothyroxine  100 mcg Oral QAC breakfast   lisinopril  10 mg Oral QHS   pantoprazole  40 mg Oral Daily   traZODone  50 mg Oral QHS   Continuous Infusions:  0.9 % NaCl with KCl 20 mEq / L 100 mL/hr at 08/11/22 0545   cefTRIAXone (ROCEPHIN)  IV 2 g (08/10/22 1948)     LOS: 0 days   Time spent: 35 mins  Jake Shark, OMS IV How to contact the [facility]  Attending or Consulting provider 7A - 7P or covering provider during after hours 7P -7A, for this patient?  Check the care team in [facility] and look for a) attending/consulting [facility] provider listed and b) the [facility] team listed Log into [email or web address] and use [place]'s universal password to access. If you do not have the password, please contact the hospital operator. Locate the [facility] provider you are looking for under [facility] and page to a number that you can  be directly reached. If you still have difficulty reaching the provider, please page the [facility] (Director on Call) for the Hospitalists listed on amion for assistance.  08/11/2022, 1:32 PM

## 2022-08-11 NOTE — Progress Notes (Signed)
Pt was assisted to the bathroom by nursing staff and stated " per pain was rated at a 7 of 10 and would take now the Tylenol " after RN in room informed her that she did not have any of her home pain medications ordered.Pt alert and able to make needs known. Will continue to monitor. Call bell in reach, bed alarm on for safety.

## 2022-08-12 DIAGNOSIS — G928 Other toxic encephalopathy: Secondary | ICD-10-CM | POA: Diagnosis not present

## 2022-08-12 LAB — BASIC METABOLIC PANEL
Anion gap: 4 — ABNORMAL LOW (ref 5–15)
BUN: 10 mg/dL (ref 8–23)
CO2: 30 mmol/L (ref 22–32)
Calcium: 8 mg/dL — ABNORMAL LOW (ref 8.9–10.3)
Chloride: 107 mmol/L (ref 98–111)
Creatinine, Ser: 0.86 mg/dL (ref 0.44–1.00)
GFR, Estimated: >60 mL/min (ref >60)
Glucose, Bld: 84 mg/dL (ref 70–99)
Potassium: 4.4 mmol/L (ref 3.5–5.1)
Sodium: 141 mmol/L (ref 135–145)

## 2022-08-12 LAB — CBC
HCT: 35.6 % — ABNORMAL LOW (ref 36.0–46.0)
Hemoglobin: 10.9 g/dL — ABNORMAL LOW (ref 12.0–15.0)
MCH: 27.9 pg (ref 26.0–34.0)
MCHC: 30.6 g/dL (ref 30.0–36.0)
MCV: 91.3 fL (ref 80.0–100.0)
Platelets: 200 10*3/uL (ref 150–400)
RBC: 3.9 MIL/uL (ref 3.87–5.11)
RDW: 12.9 % (ref 11.5–15.5)
WBC: 4.5 10*3/uL (ref 4.0–10.5)
nRBC: 0 % (ref 0.0–0.2)

## 2022-08-12 LAB — MAGNESIUM: Magnesium: 1.9 mg/dL (ref 1.7–2.4)

## 2022-08-12 LAB — GLUCOSE, CAPILLARY: Glucose-Capillary: 86 mg/dL (ref 70–99)

## 2022-08-12 MED ORDER — FOSFOMYCIN TROMETHAMINE 3 G PO PACK
3.0000 g | PACK | Freq: Once | ORAL | Status: AC
  Administered 2022-08-12: 3 g via ORAL
  Filled 2022-08-12: qty 3

## 2022-08-12 MED ORDER — DIVALPROEX SODIUM 500 MG PO DR TAB: 500.0000 mg | DELAYED_RELEASE_TABLET | Freq: Every day | ORAL | 0 refills | Status: DC

## 2022-08-12 MED ORDER — DIVALPROEX SODIUM 250 MG PO DR TAB: 500.0000 mg | DELAYED_RELEASE_TABLET | Freq: Every day | ORAL | Status: DC

## 2022-08-12 NOTE — Discharge Summary (Signed)
Diane [name] [id number] DOB: 1951-02-21 DOA: 08/10/2022  PCP: Redmond School, MD  Admit date: 08/10/2022 Discharge date: 08/12/2022  Admitted From: ED Disposition: Home  Recommendations for Outpatient Follow-up:  Follow up with PCP in 1 weeks Medication: DO NOT take your pain medications and your muscle relaxer at the same time. One time dose fosfomycin given for UTI, no need for further antibiotic use unless symptoms worsen Medication: Increased Depakote to 500mg  DR tablet due to subtherapeutic level in hospital. Continue Post foot surgery care and PT   Home Health: None  Discharge Condition: Stable  CODE STATUS: Full   Brief Hospitalization Summary: Please see all hospital notes, images, labs for full details of the hospitalization. Diane [name] is a 71yo female with history of HTN, dysphagia, chronic pain, anxiety, depression, and seizures who presented to the ED on 08/10/22 for dizziness. She came straight from her PT session which she does due to prior R foot surgery. ED staff believed she had slurred speech, but she later reported the way she was speaking was normal for her. She has chronic weakness in her right leg and foot and is on chronic pain therapy. Before her dizziness, she took Xanax, oxycodone, and her muscle relaxant at the same time, which she does not normally do, due to the pain from PT.  In the ED, Code stroke was called, CT and MRI showed no thrombus or bleed. Teleneurologist recommended hospitalization and workup for metabolic encephalopathy. UA potentially suggestive of UTI with positive nitrites and trace leukocytes. She was started on Rocephin empirically and admitted on 08/10/22 for further workup.  Urine cultures came back positive for E. coli and Klebsiella pneumoniae. MRSA nasal swab was positive. Her Depakote level was subtherapeutic at 38. Her pain in her R foot was moderately controlled. She reported some pain on palpation to her LLQ, which she stated is  normal and from her diverticula. On discharge she is stable, was given a one time dose fosfomycin for UTI, and her home Depakote was increased. She is agreeable to discharge and has no further complaints. She understands to no longer take her Xanax, pain medication, and muscle relaxer at the same time.   Discharge Diagnoses:  Principal Problem:   Toxic metabolic encephalopathy Active Problems:   Hypothyroidism   Bipolar I disorder, most recent episode depressed (HCC)   Essential hypertension   Chronic pain syndrome   Seizure (HCC)   UTI (urinary tract infection)   Discharge Instructions: Discharge Instructions     Diet - low sodium heart healthy   Complete by: As directed    Increase activity slowly   Complete by: As directed       Allergies as of 08/12/2022       Reactions   Neomycin-bacitracin Zn-polymyx Other (See Comments)   Reaction: skin starts to become raw and peels off   Penicillins Hives   Sulfonamide Derivatives Hives, Swelling        Medication List     STOP taking these medications    meloxicam 7.5 MG tablet Commonly known as: Mobic   oxyCODONE 5 MG immediate release tablet Commonly known as: Oxy IR/ROXICODONE       TAKE these medications    ALPRAZolam 0.5 MG tablet Commonly known as: XANAX TAKE ONE TABLET BY MOUTH THREE TIMES DAILY   atorvastatin 20 MG tablet Commonly known as: LIPITOR Take 20 mg by mouth daily.   divalproex 500 MG DR tablet Commonly known as: DEPAKOTE Take 1 tablet (500 mg total)  by mouth at bedtime. What changed:  medication strength how much to take how to take this when to take this additional instructions   DULoxetine 60 MG capsule Commonly known as: CYMBALTA TAKE ONE CAPSULE BY MOUTH DAILY   hydrocortisone 2.5 % rectal cream Commonly known as: ANUSOL-HC Place 1 application rectally 2 (two) times daily.   levothyroxine 100 MCG tablet Commonly known as: SYNTHROID Take 100 mcg by mouth daily before  breakfast.   lisinopril 10 MG tablet Commonly known as: ZESTRIL Take 10 mg by mouth at bedtime.   lubiprostone 24 MCG capsule Commonly known as: AMITIZA TAKE ONE CAPSULE BY MOUTH TWICE DAILY WITH A MEAL   omeprazole 20 MG capsule Commonly known as: PRILOSEC TAKE ONE CAPSULE BY MOUTH TWICE DAILY BEFORE A MEAL.   oxybutynin 10 MG 24 hr tablet Commonly known as: DITROPAN-XL Take 10 mg by mouth at bedtime.   oxyCODONE-acetaminophen 7.5-325 MG tablet Commonly known as: Percocet Take 1 tablet by mouth every 8 (eight) hours as needed for severe pain. What changed: Another medication with the same name was removed. Continue taking this medication, and follow the directions you see here.   Symproic 0.2 MG Tabs Generic drug: Naldemedine Tosylate Take 0.2 mg by mouth daily.   traZODone 50 MG tablet Commonly known as: DESYREL Take 1 tablet (50 mg total) by mouth at bedtime.        Follow-up Information     Redmond School, MD. Schedule an appointment as soon as possible for a visit in 1 week(s).   Specialty: Internal Medicine Contact information: [address] [place] [id number] [phone]                Allergies  Allergen Reactions   Neomycin-Bacitracin Zn-Polymyx Other (See Comments)    Reaction: skin starts to become raw and peels off   Penicillins Hives   Sulfonamide Derivatives Hives and Swelling   Allergies as of 08/12/2022       Reactions   Neomycin-bacitracin Zn-polymyx Other (See Comments)   Reaction: skin starts to become raw and peels off   Penicillins Hives   Sulfonamide Derivatives Hives, Swelling        Medication List     STOP taking these medications    meloxicam 7.5 MG tablet Commonly known as: Mobic   oxyCODONE 5 MG immediate release tablet Commonly known as: Oxy IR/ROXICODONE       TAKE these medications    ALPRAZolam 0.5 MG tablet Commonly known as: XANAX TAKE ONE TABLET BY MOUTH THREE TIMES DAILY    atorvastatin 20 MG tablet Commonly known as: LIPITOR Take 20 mg by mouth daily.   divalproex 500 MG DR tablet Commonly known as: DEPAKOTE Take 1 tablet (500 mg total) by mouth at bedtime. What changed:  medication strength how much to take how to take this when to take this additional instructions   DULoxetine 60 MG capsule Commonly known as: CYMBALTA TAKE ONE CAPSULE BY MOUTH DAILY   hydrocortisone 2.5 % rectal cream Commonly known as: ANUSOL-HC Place 1 application rectally 2 (two) times daily.   levothyroxine 100 MCG tablet Commonly known as: SYNTHROID Take 100 mcg by mouth daily before breakfast.   lisinopril 10 MG tablet Commonly known as: ZESTRIL Take 10 mg by mouth at bedtime.   lubiprostone 24 MCG capsule Commonly known as: AMITIZA TAKE ONE CAPSULE BY MOUTH TWICE DAILY WITH A MEAL   omeprazole 20 MG capsule Commonly known as: PRILOSEC TAKE ONE CAPSULE BY MOUTH TWICE DAILY  BEFORE A MEAL.   oxybutynin 10 MG 24 hr tablet Commonly known as: DITROPAN-XL Take 10 mg by mouth at bedtime.   oxyCODONE-acetaminophen 7.5-325 MG tablet Commonly known as: Percocet Take 1 tablet by mouth every 8 (eight) hours as needed for severe pain. What changed: Another medication with the same name was removed. Continue taking this medication, and follow the directions you see here.   Symproic 0.2 MG Tabs Generic drug: Naldemedine Tosylate Take 0.2 mg by mouth daily.   traZODone 50 MG tablet Commonly known as: DESYREL Take 1 tablet (50 mg total) by mouth at bedtime.        Procedures/Studies: DG Chest Portable 1 View  Result Date: 08/10/2022 CLINICAL DATA:  Evaluate for pneumonia. Patient states she does not feel right. EXAM: PORTABLE CHEST 1 VIEW COMPARISON:  Two-view chest x-ray 09/06/2021 FINDINGS: Low lung volumes exaggerate heart size. Mild pulmonary vascular congestion is present. No edema or effusion is present. No focal airspace disease is present. Visualized soft  tissues and bony thorax are unremarkable. IMPRESSION: Low lung volumes and mild pulmonary vascular congestion. Electronically Signed   By: Marin Roberts M.D.   On: 08/10/2022 15:21   MR BRAIN WO CONTRAST  Result Date: 08/10/2022 CLINICAL DATA:  Stroke suspected EXAM: MRI HEAD WITHOUT CONTRAST TECHNIQUE: Multiplanar, multiecho pulse sequences of the brain and surrounding structures were obtained without intravenous contrast. COMPARISON:  Same-day CT brain FINDINGS: Brain: No acute infarction, hemorrhage, hydrocephalus, extra-axial collection or mass lesion. Vascular: Normal flow voids. Skull and upper cervical spine: Normal marrow signal. Sinuses/Orbits: Bilateral lens replacement. Paranasal sinuses are clear. No mastoid effusion. Other: None IMPRESSION: Negative for acute infarct.  No hemorrhage. Electronically Signed   By: Lorenza Cambridge M.D.   On: 08/10/2022 14:34   CT HEAD CODE STROKE WO CONTRAST  Result Date: 08/10/2022 CLINICAL DATA:  Code stroke.  Dizziness/lightheadedness. EXAM: CT HEAD WITHOUT CONTRAST TECHNIQUE: Contiguous axial images were obtained from the base of the skull through the vertex without intravenous contrast. RADIATION DOSE REDUCTION: This exam was performed according to the departmental dose-optimization program which includes automated exposure control, adjustment of the mA and/or kV according to patient size and/or use of iterative reconstruction technique. COMPARISON:  Brain MRI 02/18/2020 FINDINGS: Brain: There is no acute intracranial hemorrhage, extra-axial fluid collection, or acute infarct. Parenchymal volume is within expected limits for age. The ventricles are normal in size. Gray-white differentiation is preserved. There is no mass lesion.  There is no mass effect or midline shift. Vascular: No hyperdense vessel or unexpected calcification. Skull: Normal. Negative for fracture or focal lesion. Sinuses/Orbits: The imaged paranasal sinuses are clear. Bilateral lens  implants are in place. The globes and orbits are otherwise unremarkable. Other: None. ASPECTS [facility] Stroke Program Early CT Score) - Ganglionic level infarction (caudate, lentiform nuclei, internal capsule, insula, M1-M3 cortex): 7 - Supraganglionic infarction (M4-M6 cortex): 3 Total score (0-10 with 10 being normal): 10 IMPRESSION: 1. No acute intracranial pathology. 2. ASPECTS is 10 These results were called by telephone at the time of interpretation on 08/10/2022 at 1:53 pm to provider Dr Estell Harpin, who verbally acknowledged these results. Electronically Signed   By: Lesia Hausen M.D.   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 Her chronic pain is still present, but better controlled. She denies headache, dizziness, any other complaints. She understands discharge instructions to no longer take her Xanax, pain medication, and muscle relaxer at the same time and she is ready and willing to return home.   Discharge Exam: Vitals:   08/11/22 1518 08/11/22 2122 08/12/22 0329 08/12/22 0818  BP: 123/67 129/75 138/78 136/77  Pulse: 71 66 63 64  Resp: 14 16  17   Temp: 98.1 F (36.7 C) 97.8 F (36.6 C) (!) 97.5 F (36.4 C) 97.7 F (36.5 C)  TempSrc: Oral Oral Oral   SpO2: 95% 98% 95% 95%  Weight:      Height:        General: Pt is alert, awake, not in acute distress Cardiovascular: RRR, S1/S2 +, no rubs, no gallops Respiratory: CTA bilaterally, no wheezing, no rhonchi Abdominal: Soft, Some chronic tenderness in LLQ, ND, bowel  sounds + Extremities: R foot pain and scar on R foot from recent surgery    The results of significant diagnostics from this hospitalization (including imaging, microbiology, ancillary and laboratory) are listed below for reference.     Microbiology: Recent Results (from the past 240 hour(s))  Urine Culture     Status: Abnormal (Preliminary result)   Collection Time: 08/10/22  5:25 PM   Specimen: Urine, Clean Catch  Result Value Ref Range Status   Specimen Description   Final    URINE, CLEAN CATCH Performed at [facility], [address], [place], [place] [address]    Special Requests   Final    NONE Performed at [facility], [address], [place], [place] [address]    Culture (A)  Final    >=100,000 COLONIES/mL ESCHERICHIA COLI 50,000 COLONIES/mL KLEBSIELLA PNEUMONIAE CULTURE REINCUBATED FOR BETTER GROWTH Performed at [facility], [place] [address] [place], [place] [address]    Report Status PENDING  Incomplete  MRSA Next Gen by PCR, Nasal     Status: Abnormal   Collection Time: 08/10/22  8:25 PM   Specimen: Nasal Mucosa; Nasal Swab  Result Value Ref Range Status   MRSA by PCR Next Gen DETECTED (A) NOT DETECTED Final    Comment: RESULT CALLED TO, READ BACK BY AND VERIFIED WITH: VILLALOBOS,S ON 08/10/22 AT 2250 BY LOY,C (NOTE) The GeneXpert MRSA Assay (FDA approved for NASAL specimens only), is one component of a comprehensive MRSA colonization surveillance program. It is not intended to diagnose MRSA infection nor to guide or monitor treatment for MRSA infections. Test performance is not FDA approved in patients less than 16 years old. Performed at [facility], [address] [place], [place] [address]      Labs:  Basic Metabolic Panel: Recent Labs  Lab 08/10/22 1308 08/10/22 1309 08/10/22 1318 08/12/22 0408  NA  --  138 138 141  K  --  3.7 4.4 4.4  CL  --  102 101 107  CO2  --  29 30 30   GLUCOSE  --  81 87 84  BUN  --  13 14 10   CREATININE  --   0.96 1.10* 0.86  CALCIUM  --  8.4* 8.4* 8.0*  MG 1.9  --   --  1.9   Liver Function Tests: Recent Labs  Lab 08/10/22 1318  AST 21  ALT 14  ALKPHOS  54  BILITOT 0.7  PROT 6.5  ALBUMIN 3.2*    CBC: Recent Labs  Lab 08/10/22 1309 08/10/22 1318 08/12/22 0408  WBC 4.7  --  4.5  NEUTROABS  --  1.8  --   HGB 10.6*  --  10.9*  HCT 33.9*  --  35.6*  MCV 91.4  --  91.3  PLT 211  --  200   CBG: Recent Labs  Lab 08/10/22 1249 08/12/22 0810  GLUCAP 78 86   Urinalysis    Component Value Date/Time   COLORURINE YELLOW 08/10/2022 1725   APPEARANCEUR CLEAR 08/10/2022 1725   LABSPEC 1.011 08/10/2022 1725   PHURINE 5.0 08/10/2022 1725   GLUCOSEU NEGATIVE 08/10/2022 1725   HGBUR SMALL (A) 08/10/2022 1725   HGBUR negative 01/10/2010 1059   BILIRUBINUR NEGATIVE 08/10/2022 [place] 08/10/2022 1725   PROTEINUR NEGATIVE 08/10/2022 1725   UROBILINOGEN 0.2 01/10/2010 1059   NITRITE POSITIVE (A) 08/10/2022 1725   LEUKOCYTESUR TRACE (A) 08/10/2022 1725   Sepsis Labs Recent Labs  Lab 08/10/22 1309 08/12/22 0408  WBC 4.7 4.5    Time coordinating discharge: 62min  SIGNED:  Missy Sabins, OMS IV  [facility] 08/12/2022, 9:48 AM How to contact the [facility] Attending or Consulting provider [place] or covering provider during after hours [place], for this patient?  Check the care team in [facility] and look for a) attending/consulting [facility] provider listed and b) the [facility] team listed Log into [email or web address] and use [place]'s universal password to access. If you do not have the password, please contact the hospital operator. Locate the [facility] provider you are looking for under [facility] and page to a number that you can be directly reached. If you still have difficulty reaching the provider, please page the [facility] (Director on Call) for the Hospitalists listed on amion for assistance.

## 2022-08-13 LAB — URINE CULTURE: Culture: >=100000 — AB

## 2022-08-14 ENCOUNTER — Encounter: Payer: Self-pay | Admitting: Orthopedic Surgery

## 2022-08-14 NOTE — Progress Notes (Signed)
Office Visit Note   Patient: Diane [name]           Date of Birth: 06-29-1951           MRN: [id number] Visit Date: 07/27/2022              Requested by: Elfredia NevinsFusco, [place], MD [address] [place],  [place] [id number] PCP: Elfredia NevinsFusco, [place], MD  Chief Complaint  Patient presents with   Right Foot - Follow-up    03/31/2022 right foot tarsal MT fusion and achilles lengthening      HPI: Patient is a 71 year old woman who is 4 months status post tarsal metatarsal fusion and Achilles lengthening.  Patient has been working with physical therapy she is taken Aleve 2 twice a day.  Assessment & Plan: Visit Diagnoses:  1. Pain in right foot     Plan: Recommended continuing with her Achilles stretching.  Prescription provided for Percocet.  Follow-Up Instructions: Return in about 4 weeks (around 08/24/2022).   Ortho Exam  Patient is alert, oriented, no adenopathy, well-dressed, normal affect, normal respiratory effort. Examination patient has dorsiflexion to 10 degrees of the ankle.  Patient was given instructions and demonstrated Achilles stretching.  There is mild swelling in her foot she has dorsiflexion of the great toe 30 degrees.  Recommended a stiff soled sneaker.  There is no redness no cellulitis the incision is well-healed.  Imaging: No results found. No images are attached to the encounter.  Labs: Lab Results  Component Value Date   HGBA1C 5.5 02/19/2020   HGBA1C 5.8 (H) 01/25/2012   HGBA1C 5.7 07/13/2009   ESRSEDRATE 2 03/26/2018   ESRSEDRATE 4 03/16/2014   ESRSEDRATE 40 (H) 12/30/2012   CRP 0.8 03/26/2018   CRP <0.5 03/16/2014   CRP 1.6 (H) 12/30/2012   REPTSTATUS 08/13/2022 FINAL 08/10/2022   GRAMSTAIN  01/03/2013    RARE WBC PRESENT, PREDOMINANTLY MONONUCLEAR NO SQUAMOUS EPITHELIAL CELLS SEEN NO ORGANISMS SEEN   GRAMSTAIN  01/03/2013    RARE WBC PRESENT, PREDOMINANTLY PMN NO SQUAMOUS EPITHELIAL CELLS SEEN NO ORGANISMS SEEN   GRAMSTAIN  01/03/2013     RARE WBC PRESENT,BOTH PMN AND MONONUCLEAR NO SQUAMOUS EPITHELIAL CELLS SEEN NO ORGANISMS SEEN   GRAMSTAIN  01/03/2013    RARE WBC PRESENT, PREDOMINANTLY PMN NO SQUAMOUS EPITHELIAL CELLS SEEN NO ORGANISMS SEEN   GRAMSTAIN  01/03/2013    RARE WBC PRESENT,BOTH PMN AND MONONUCLEAR NO SQUAMOUS EPITHELIAL CELLS SEEN NO ORGANISMS SEEN   GRAMSTAIN  01/03/2013    RARE WBC PRESENT, PREDOMINANTLY MONONUCLEAR NO SQUAMOUS EPITHELIAL CELLS SEEN NO ORGANISMS SEEN   CULT (A) 08/10/2022    >=100,000 COLONIES/mL ESCHERICHIA COLI 50,000 COLONIES/mL KLEBSIELLA PNEUMONIAE    LABORGA ESCHERICHIA COLI (A) 08/10/2022   LABORGA KLEBSIELLA PNEUMONIAE (A) 08/10/2022     Lab Results  Component Value Date   ALBUMIN 3.2 (L) 08/10/2022   ALBUMIN 3.5 02/18/2020   ALBUMIN 3.4 (L) 11/10/2019    Lab Results  Component Value Date   MG 1.9 08/12/2022   MG 1.9 08/10/2022   MG 1.7 02/18/2020   No results found for: "VD25OH"  No results found for: "PREALBUMIN"    Latest Ref Rng & Units 08/12/2022    4:08 AM 08/10/2022    1:18 PM 08/10/2022    1:09 PM  CBC EXTENDED  WBC 4.0 - 10.5 K/uL 4.5   4.7   RBC 3.87 - 5.11 MIL/uL 3.90   3.71   Hemoglobin 12.0 - 15.0 g/dL 19.110.9   47.810.6  HCT 36.0 - 46.0 % 35.6   33.9   Platelets 150 - 400 K/uL 200   211   NEUT# 1.7 - 7.7 K/uL  1.8    Lymph# 0.7 - 4.0 K/uL  2.2       There is no height or weight on file to calculate BMI.  Orders:  No orders of the defined types were placed in this encounter.  Meds ordered this encounter  Medications   DISCONTD: oxyCODONE-acetaminophen (PERCOCET/ROXICET) 5-325 MG tablet    Sig: Take 1 tablet by mouth every 8 (eight) hours as needed.    Dispense:  30 tablet    Refill:  0     Procedures: No procedures performed  Clinical Data: No additional findings.  ROS:  All other systems negative, except as noted in the HPI. Review of Systems  Objective: Vital Signs: There were no vitals taken for this visit.  Specialty  Comments:  No specialty comments available.  PMFS History: Patient Active Problem List   Diagnosis Date Noted   Toxic metabolic encephalopathy 08/10/2022   Seizure (HCC) 08/10/2022   UTI (urinary tract infection) 08/10/2022   Primary osteoarthritis of right foot    Syncope 02/18/2020   Prolonged QT interval 02/18/2020   Glucose intolerance 02/18/2020   Normocytic anemia 12/10/2019   Poor appetite 09/24/2019   Abnormal weight loss 09/24/2019   Upper abdominal mass 09/24/2019   Dysphagia 01/30/2019   Simple adnexal cyst greater than 1 cm in diameter in postmenopausal patient 11/08/2018   Abdominal pain 08/09/2015   Dysphagia, pharyngoesophageal phase    H/O adenomatous polyp of colon 05/05/2015   Constipation 02/02/2015   Knee contusion 12/09/2013   Esophageal dysphagia 07/02/2013   Abdominal pain, epigastric 07/02/2013   Chronic pain syndrome 11/28/2012   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   Cellulitis 11/04/2012   Postoperative wound breakdown 11/04/2012   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   Knee pain 08/13/2012   Hypokalemia 08/01/2012   Cellulitis, wound, post-operative 07/30/2012   Degenerative tear of medial meniscus of right knee 01/12/2012   OA (osteoarthritis) of knee 01/12/2012   Right knee sprain 11/02/2011   Instability of knee joint 03/29/2011   URTICARIA 12/21/2009   OTHER URINARY INCONTINENCE 07/18/2009   FOOT PAIN, LEFT 05/26/2009   DERMATITIS, CHRONIC 10/21/2008   IMPAIRED GLUCOSE TOLERANCE 09/09/2008   Chronic back pain 09/09/2008   Hypothyroidism 02/27/2008   Hyperlipidemia 02/27/2008   Anxiety with depression 02/27/2008   Bipolar I disorder, most recent episode depressed (HCC) 02/27/2008   Essential hypertension 02/27/2008   ALLERGIC RHINITIS 02/27/2008   GERD 02/27/2008   HEADACHE 02/27/2008   Past Medical History:  Diagnosis Date   Allergic rhinitis    Anxiety    Arthritis    Cataracts, bilateral    Chronic back pain     Sees pain mamagement clinic   Chronic pain syndrome    Constipation    Depression    Essential hypertension    GERD (gastroesophageal reflux disease)    Headaches, cluster    History of seizures    Hypothyroid    Mania (HCC)    Mixed hyperlipidemia    PTSD (post-traumatic stress disorder)    Right lumbar radiculopathy    Sleep apnea    STOP BANG score=4   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(HCC)     Family History  Problem Relation Age of Onset   Alcohol abuse Father    Anxiety disorder Father    Alcohol abuse Mother  Anxiety disorder Mother    Alcohol abuse Brother    Anxiety disorder Brother    Drug abuse Brother    Drug abuse Brother    Alcohol abuse Brother    Anxiety disorder Brother    Seizures Brother    Alcohol abuse Brother    Anxiety disorder Brother    Seizures Brother    Alcohol abuse Brother    Anxiety disorder Brother    Alcohol abuse Brother    Anxiety disorder Brother    Breast cancer Sister    Bipolar disorder Sister    Breast cancer Sister    Dementia Paternal Aunt    ADD / ADHD Grandchild    ADD / ADHD Grandchild    Heart disease Other    Diabetes Other    Alcohol abuse Other    Thyroid disease Son    Thyroid disease Daughter    Depression Neg Hx    OCD Neg Hx    Paranoid behavior Neg Hx    Schizophrenia Neg Hx    Sexual abuse Neg Hx    Physical abuse Neg Hx    Colon cancer Neg Hx     Past Surgical History:  Procedure Laterality Date   BACK SURGERY     BILATERAL OOPHORECTOMY     CATARACT EXTRACTION W/PHACO Left 01/04/2016   Procedure: CATARACT EXTRACTION PHACO AND INTRAOCULAR LENS PLACEMENT (IOC);  Surgeon: Jethro Bolus, MD;  Location: [facility];  Service: Ophthalmology;  Laterality: Left;  CDE:4.70   CATARACT EXTRACTION W/PHACO Right 01/18/2016   Procedure: CATARACT EXTRACTION PHACO AND INTRAOCULAR LENS PLACEMENT RIGHT EYE; CDE:  4.66;  Surgeon: Jethro Bolus, MD;  Location: [facility];  Service: Ophthalmology;  Laterality: Right;    CHOLECYSTECTOMY     COLONOSCOPY  10/16/2008   VQM:[id number], otherwise normal rectum; pancolonic diverticula the remainder of the colonic mucosa appeared normal. Next TCS due 10/2013.   COLONOSCOPY  06/28/2006   RMR: Internal hemorrhoids. Diminutive rectal polyps, cold biopsied/ Pancolonic diverticula. Polyps in the right colon removed    COLONOSCOPY WITH PROPOFOL N/A 05/20/2015   RMR: Pancolonic diverticulosis. Redundant colon    COLONOSCOPY WITH PROPOFOL N/A 11/11/2020   Procedure: COLONOSCOPY WITH PROPOFOL;  Surgeon: Corbin Ade, MD;  Location: [facility];  Service: Endoscopy;  Laterality: N/A;  pm ASA 2   ESOPHAGOGASTRODUODENOSCOPY  10/16/2008   OIZ:TIWPY hiatal hernia otherwise normal esophagus, stomach  D1, D2, status post passage of a 56-French Maloney dilator   ESOPHAGOGASTRODUODENOSCOPY  06/28/2006   KDX:IPJASN esophagus. Small hiatal hernia. Otherwise normal stomach, D1 and D2, status post passage of a 56 French Maloney dilator   ESOPHAGOGASTRODUODENOSCOPY (EGD) WITH ESOPHAGEAL DILATION N/A 07/14/2013   RMR: Abnormal esophagus of uncertain significance-status post esophageal biospy. small hiatal hernia.    ESOPHAGOGASTRODUODENOSCOPY (EGD) WITH PROPOFOL N/A 05/20/2015   RMR: NORMAL EGD status post maloney dilation    ESOPHAGOGASTRODUODENOSCOPY (EGD) WITH PROPOFOL N/A 12/23/2018   Dr. Jena Gauss: normal esophagus, s/p esophageal dilation.    FOOT ARTHRODESIS Right 03/31/2022   Procedure: RIGHT FOOT TARSAL-METATARSAL FUSION;  Surgeon: Nadara Mustard, MD;  Location: [facility] OR;  Service: Orthopedics;  Laterality: Right;   FOOT SURGERY     left foot-   GASTROCNEMIUS RECESSION Right 03/31/2022   Procedure: RIGHT ACHILLES LENGTHENING;  Surgeon: Nadara Mustard, MD;  Location: [facility] OR;  Service: Orthopedics;  Laterality: Right;   INCISION AND DRAINAGE Right 01/03/2013   Procedure: INCISION AND DRAINAGE;  Surgeon: Vickki Hearing, MD;  Location: [facility];  Service: Orthopedics;  Laterality: Right;    KNEE ARTHROSCOPY  01/2012   right   KNEE ARTHROSCOPY WITH LATERAL RELEASE Right 01/03/2013   Procedure: Lateral Release Patella Right Knee;  Surgeon: Vickki Hearing, MD;  Location: [facility];  Service: Orthopedics;  Laterality: Right;   LAPAROSCOPIC LYSIS OF ADHESIONS N/A 11/12/2019   Procedure: LAPAROSCOPIC LYSIS OF ADHESIONS, extensive; drainage of peritoneal cyst;  Surgeon: Lazaro Arms, MD;  Location: [facility];  Service: Gynecology;  Laterality: N/A;   LUMBAR DISC SURGERY  L4-5   MALONEY DILATION N/A 05/20/2015   Procedure: Elease Hashimoto DILATION;  Surgeon: Corbin Ade, MD;  Location: [facility];  Service: Endoscopy;  Laterality: N/AElease Hashimoto dilator # 54   MALONEY DILATION N/A 12/23/2018   Procedure: Elease Hashimoto DILATION;  Surgeon: Corbin Ade, MD;  Location: [facility];  Service: Endoscopy;  Laterality: N/A;   PARTIAL HYSTERECTOMY     TOTAL KNEE ARTHROPLASTY  07/01/2012   Procedure: TOTAL KNEE ARTHROPLASTY;  Surgeon: Vickki Hearing, MD;  Location: [facility];  Service: Orthopedics;  Laterality: Right;   TOTAL KNEE REVISION Right 01/03/2013   Procedure: Patellaplasty Right Knee;  Surgeon: Vickki Hearing, MD;  Location: [facility];  Service: Orthopedics;  Laterality: Right;   Social History   Occupational History   Occupation: disabled    Associate Professor: UNEMPLOYED  Tobacco Use   Smoking status: Former    Packs/day: 0.25    Years: 5.00    Total pack years: 1.25    Types: Cigarettes    Quit date: 06/26/2003    Years since quitting: 19.1   Smokeless tobacco: Never  Vaping Use   Vaping Use: Never used  Substance and Sexual Activity   Alcohol use: Not Currently    Comment: occasionally; once a month.   Drug use: Not Currently    Comment: has a past history of street drug use   Sexual activity: Yes    Birth control/protection: Surgical    Comment: hyst

## 2022-08-15 ENCOUNTER — Encounter (HOSPITAL_COMMUNITY): Payer: Medicare Other

## 2022-08-17 ENCOUNTER — Encounter (HOSPITAL_COMMUNITY): Payer: Medicare Other | Admitting: Physical Therapy

## 2022-08-17 ENCOUNTER — Other Ambulatory Visit: Payer: Self-pay

## 2022-08-17 DIAGNOSIS — N390 Urinary tract infection, site not specified: Secondary | ICD-10-CM | POA: Diagnosis not present

## 2022-08-17 DIAGNOSIS — T50915A Adverse effect of multiple unspecified drugs, medicaments and biological substances, initial encounter: Secondary | ICD-10-CM | POA: Diagnosis not present

## 2022-08-17 DIAGNOSIS — G894 Chronic pain syndrome: Secondary | ICD-10-CM

## 2022-08-17 DIAGNOSIS — G8929 Other chronic pain: Secondary | ICD-10-CM | POA: Diagnosis not present

## 2022-08-17 MED ORDER — OXYCODONE-ACETAMINOPHEN 7.5-325 MG PO TABS: 1.0000 | ORAL_TABLET | Freq: Three times a day (TID) | ORAL | 0 refills | Status: DC | PRN

## 2022-08-17 NOTE — Telephone Encounter (Signed)
Oxycodone-Acetaminophen 7.5/325 MG  Qty 20 Tablets  Take 1 tablet by mouth every 8 (eight) hours as needed for severe pain.  PATIENT USES [facility]

## 2022-08-21 ENCOUNTER — Telehealth (HOSPITAL_COMMUNITY): Payer: Self-pay

## 2022-08-21 NOTE — Telephone Encounter (Signed)
Spoke with Diane [name] advised of Dr Ross's message Diane [name] verbalized understanding  

## 2022-08-21 NOTE — Telephone Encounter (Signed)
Spoke with pt to r/s 08/31/22 appt. She states that she was admitted to the hospital for 2 days and her depakote was changed to 500 MG where it was 125 MG by the provider there. Pt is wanting to know if this is ok to start the 500 MG or does she need to keep taking 125 MG. Pt has not started the medication yet. Please advise.

## 2022-08-22 ENCOUNTER — Ambulatory Visit (HOSPITAL_COMMUNITY): Payer: Medicare Other | Attending: Orthopedic Surgery | Admitting: Physical Therapy

## 2022-08-22 DIAGNOSIS — M25571 Pain in right ankle and joints of right foot: Secondary | ICD-10-CM | POA: Diagnosis not present

## 2022-08-22 DIAGNOSIS — R262 Difficulty in walking, not elsewhere classified: Secondary | ICD-10-CM | POA: Diagnosis not present

## 2022-08-22 NOTE — Therapy (Signed)
OUTPATIENT PHYSICAL THERAPY LOWER EXTREMITY TREATMENT   Patient Name: Diane [name] MRN: [id number] DOB:1951/04/01, 71 y.o., female Today's Date: 08/22/2022   Progress Note Reporting Period 06/20/22 to 08/22/22  See note below for Objective Data and Assessment of Progress/Goals.      PT End of Session - 08/22/22 1023     Visit Number 10    Number of Visits 22    Date for PT Re-Evaluation 10/03/22    Authorization Type [employer]    Progress Note Due on Visit 20    PT Start Time 1020    PT Stop Time 1100    PT Time Calculation (min) 40 min    Activity Tolerance Patient tolerated treatment well    Behavior During Therapy WFL for tasks assessed/performed                  Past Medical History:  Diagnosis Date   Allergic rhinitis    Anxiety    Arthritis    Cataracts, bilateral    Chronic back pain    Sees pain mamagement clinic   Chronic pain syndrome    Constipation    Depression    Essential hypertension    GERD (gastroesophageal reflux disease)    Headaches, cluster    History of seizures    Hypothyroid    Mania ([place])    Mixed hyperlipidemia    PTSD (post-traumatic stress disorder)    Right lumbar radiculopathy    Sleep apnea    STOP BANG score=4   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[facility])    Past Surgical History:  Procedure Laterality Date   BACK SURGERY     BILATERAL OOPHORECTOMY     CATARACT EXTRACTION W/PHACO Left 01/04/2016   Procedure: CATARACT EXTRACTION PHACO AND INTRAOCULAR LENS PLACEMENT ([place]);  Surgeon: Rutherford Guys, MD;  Location: [facility];  Service: Ophthalmology;  Laterality: Left;  CDE:4.70   CATARACT EXTRACTION W/PHACO Right 01/18/2016   Procedure: CATARACT EXTRACTION PHACO AND INTRAOCULAR LENS PLACEMENT RIGHT EYE; CDE:  4.66;  Surgeon: Rutherford Guys, MD;  Location: [facility];  Service: Ophthalmology;  Laterality: Right;   CHOLECYSTECTOMY     COLONOSCOPY  10/16/2008   PTW:[id number], otherwise normal rectum; pancolonic diverticula the  remainder of the colonic mucosa appeared normal. Next TCS due 10/2013.   COLONOSCOPY  06/28/2006   RMR: Internal hemorrhoids. Diminutive rectal polyps, cold biopsied/ Pancolonic diverticula. Polyps in the right colon removed    COLONOSCOPY WITH PROPOFOL N/A 05/20/2015   RMR: Pancolonic diverticulosis. Redundant colon    COLONOSCOPY WITH PROPOFOL N/A 11/11/2020   Procedure: COLONOSCOPY WITH PROPOFOL;  Surgeon: Daneil Dolin, MD;  [facility];  Service: Endoscopy;  Laterality: N/A;  pm ASA 2   ESOPHAGOGASTRODUODENOSCOPY  10/16/2008   YFV:CBSWH hiatal hernia otherwise normal esophagus, stomach  D1, D2, status post passage of a 56-French Maloney dilator   ESOPHAGOGASTRODUODENOSCOPY  06/28/2006   QPR:FFMBWG esophagus. Small hiatal hernia. Otherwise normal stomach, D1 and D2, status post passage of a 56 French Maloney dilator   ESOPHAGOGASTRODUODENOSCOPY (EGD) WITH ESOPHAGEAL DILATION N/A 07/14/2013   RMR: Abnormal esophagus of uncertain significance-status post esophageal biospy. small hiatal hernia.    ESOPHAGOGASTRODUODENOSCOPY (EGD) WITH PROPOFOL N/A 05/20/2015   RMR: NORMAL EGD status post maloney dilation    ESOPHAGOGASTRODUODENOSCOPY (EGD) WITH PROPOFOL N/A 12/23/2018   Dr. Gala Romney: normal esophagus, s/p esophageal dilation.    FOOT ARTHRODESIS Right 03/31/2022   Procedure: RIGHT FOOT TARSAL-METATARSAL FUSION;  Surgeon: Newt Minion, MD;  Location: [facility]  OR;  Service: Orthopedics;  Laterality: Right;   FOOT SURGERY     left foot-   GASTROCNEMIUS RECESSION Right 03/31/2022   Procedure: RIGHT ACHILLES LENGTHENING;  Surgeon: Newt Minion, MD;  Location: [place];  Service: Orthopedics;  Laterality: Right;   INCISION AND DRAINAGE Right 01/03/2013   Procedure: INCISION AND DRAINAGE;  Surgeon: Carole Civil, MD;  Location: [facility];  Service: Orthopedics;  Laterality: Right;   KNEE ARTHROSCOPY  01/2012   right   KNEE ARTHROSCOPY WITH LATERAL RELEASE Right 01/03/2013   Procedure: Lateral Release  Patella Right Knee;  Surgeon: Carole Civil, MD;  Location: [facility];  Service: Orthopedics;  Laterality: Right;   LAPAROSCOPIC LYSIS OF ADHESIONS N/A 11/12/2019   Procedure: LAPAROSCOPIC LYSIS OF ADHESIONS, extensive; drainage of peritoneal cyst;  Surgeon: Florian Buff, MD;  Location: [facility];  Service: Gynecology;  Laterality: N/A;   LUMBAR DISC SURGERY  L4-5   MALONEY DILATION N/A 05/20/2015   Procedure: Venia Minks DILATION;  Surgeon: Daneil Dolin, MD;  Location: [facility];  Service: Endoscopy;  Laterality: N/AVenia Minks dilator # 28   MALONEY DILATION N/A 12/23/2018   Procedure: Venia Minks DILATION;  Surgeon: Daneil Dolin, MD;  [facility];  Service: Endoscopy;  Laterality: N/A;   PARTIAL HYSTERECTOMY     TOTAL KNEE ARTHROPLASTY  07/01/2012   Procedure: TOTAL KNEE ARTHROPLASTY;  Surgeon: Carole Civil, MD;  Location: [facility];  Service: Orthopedics;  Laterality: Right;   TOTAL KNEE REVISION Right 01/03/2013   Procedure: Patellaplasty Right Knee;  Surgeon: Carole Civil, MD;  Location: [facility];  Service: Orthopedics;  Laterality: Right;   Patient Active Problem List   Diagnosis Date Noted   Toxic metabolic encephalopathy 95/05/3266   Seizure ([place]) 08/10/2022   UTI (urinary tract infection) 08/10/2022   Primary osteoarthritis of right foot    Syncope 02/18/2020   Prolonged QT interval 02/18/2020   Glucose intolerance 02/18/2020   Normocytic anemia 12/10/2019   Poor appetite 09/24/2019   Abnormal weight loss 09/24/2019   Upper abdominal mass 09/24/2019   Dysphagia 01/30/2019   Simple adnexal cyst greater than 1 cm in diameter in postmenopausal patient 11/08/2018   Abdominal pain 08/09/2015   Dysphagia, pharyngoesophageal phase    H/O adenomatous polyp of colon 05/05/2015   Constipation 02/02/2015   Knee contusion 12/09/2013   Esophageal dysphagia 07/02/2013   Abdominal pain, epigastric 07/02/2013   Chronic pain syndrome 11/28/2012   Insomnia due to mental disorder  11/28/2012   Cellulitis 11/04/2012   Postoperative wound breakdown 11/04/2012   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   Knee pain 08/13/2012   Hypokalemia 08/01/2012   Cellulitis, wound, post-operative 07/30/2012   Degenerative tear of medial meniscus of right knee 01/12/2012   OA (osteoarthritis) of knee 01/12/2012   Right knee sprain 11/02/2011   Instability of knee joint 03/29/2011   URTICARIA 12/21/2009   OTHER URINARY INCONTINENCE 07/18/2009   FOOT PAIN, LEFT 05/26/2009   DERMATITIS, CHRONIC 10/21/2008   IMPAIRED GLUCOSE TOLERANCE 09/09/2008   Chronic back pain 09/09/2008   Hypothyroidism 02/27/2008   Hyperlipidemia 02/27/2008   Anxiety with depression 02/27/2008   Bipolar I disorder, most recent episode depressed ([place]) 02/27/2008   Essential hypertension 02/27/2008   ALLERGIC RHINITIS 02/27/2008   GERD 02/27/2008   HEADACHE 02/27/2008    PCP: Redmond School MD  REFERRING PROVIDER: Newt Minion, MD  Next apt: 08/26/22  REFERRING DIAG: T24.580D (ICD-10-CM) - Sprain of tarsometatarsal ligament of  right foot, subsequent encounter   THERAPY DIAG:  Pain in right ankle and joints of right foot  Difficulty in walking, not elsewhere classified  Rationale for Evaluation and Treatment Rehabilitation  ONSET DATE: 03/31/22  SUBJECTIVE:   SUBJECTIVE STATEMENT:  Doing better. She was hospitalized recently for an "infection". She was given medication and cleared. She is feeling much better now. No pain currently.    Eval: Patient presents with RT foot and ankle pain s/p RT metatarsal fusion on 03/31/20. She was having trouble for several months and they thought it was a sprain. She wore boot for 6 weeks but did not get any better. She was sent for surgery. She is still having trouble walking, still having pain. She says her sx are no better since surgery. She has not had formal therapy for this. She is still taking pain medication and she is walking with a SPC.    PERTINENT HISTORY: Arthritis, HTN, anxiety  PAIN:  Are you having pain? Yes: NPRS scale: 0/10 Pain location: RT dorsal foot down to great toe  Pain description: throbbing, stinging Aggravating factors: standing, walking Relieving factors: meds, soaking in epsom salt   PRECAUTIONS: None  WEIGHT BEARING RESTRICTIONS No  FALLS:  Has patient fallen in last 6 months? Yes. Number of falls 2  LIVING ENVIRONMENT: Lives with: lives with their partner Lives in: House/apartment Stairs: No Has following equipment at home: Single point cane and Environmental consultant - 2 wheeled  OCCUPATION: Retired   PLOF: Independent with basic ADLs  PATIENT GOALS Decrease pain and swelling    OBJECTIVE:   DIAGNOSTIC FINDINGS: NA  PATIENT SURVEYS:  FOTO 38% (was 45% function)  COGNITION:  Overall cognitive status: Within functional limits for tasks assessed     SENSATION: Reports numbness/ tingling in RT foot   EDEMA:  Min/ mod edema in RT foot   PALPATION: Hyper sensitive about incision and top of RT foot  LOWER EXTREMITY ROM:  Active ROM Right eval Left eval Right 08/22/22 Left 08/22/22  Hip flexion      Hip extension      Hip abduction      Hip adduction      Hip internal rotation      Hip external rotation      Knee flexion      Knee extension      Ankle dorsiflexion -6 4 -20 (has 5 deg PROM) 3  Ankle plantarflexion _0 48  Ankle inversion  18    Ankle eversion  12     (Blank rows = not tested)  LOWER EXTREMITY MMT:  MMT Right eval Left eval Right 07/19/22 Right 08/22/22 Left 08/22/22  Hip flexion       Hip extension       Hip abduction       Hip adduction       Hip internal rotation       Hip external rotation       Knee flexion       Knee extension       Ankle dorsiflexion 3-  2+ * 3+ 4  Ankle plantarflexion       Ankle inversion 3-  3-* 3+ 4  Ankle eversion 3-  3-* 3- 4   (Blank rows = not tested)  *=pain     FUNCTIONAL TESTS:  2 minute walk test:  180 feet  GAIT: Distance walked: 180 feet  Assistive device utilized: Single point cane Level of assistance: Modified independence Comments: decreased  stride, antalgic, decreased stance on RT    TODAY'S TREATMENT: 08/22/22 Reassess FOTO AROM MMT 2 MWT  Seated calf stretch 2x 30" Ankle band 4 way GTB x 10 each  08/10/22 STW and man ROM to right foot and ankle and big toe x 10'; no other treatment performed during manual treatment.   [place] pick up x 5' Heel slides x 10 on box Dorsiflexion stretch with towel 5 x 10" 4 way RTB 10 x each Ball roll for DF/PF x 2'   08/08/22: Isometric 10x5" pushing into ball BAPS L3 DF/PF; Inv/Ev; CW/CCW RTB 4 way 10x 5" each Towel gastroc stretch 2x 30"    PATIENT EDUCATION:  Education details: on eval findings, POC and HEP  Person educated: Patient Education method: Explanation Education comprehension: verbalized understanding   HOME EXERCISE PROGRAM: Access Code: [email or web address] URL: [email or web address] 07/04/22 - Seated Ankle Plantar Flexion with Resistance Loop  - 2 x daily - 7 x weekly - 2 sets - 10 reps - Seated Ankle Inversion with Resistance  - 2 x daily - 7 x weekly - 2 sets - 10 reps - Seated Ankle Eversion with Resistance  - 2 x daily - 7 x weekly - 2 sets - 10 reps - Seated Ankle Dorsiflexion with Resistance  - 2 x daily - 7 x weekly - 2 sets - 10 reps  Date: 06/20/2022 Prepared by: Josue Hector  Exercises - Towel Scrunches  - 3 x daily - 7 x weekly - 3 sets - 10 reps - Ankle Inversion Eversion Towel Slide  - 3 x daily - 7 x weekly - 3 sets - 10 reps - Seated Ankle Circles  - 3 x daily - 7 x weekly - 3 sets - 10 reps - Seated Calf Stretch with Strap  - 3 x daily - 7 x weekly - 1 sets - 4 reps - 30 second hold  ASSESSMENT:  CLINICAL IMPRESSION: Patient showing variable improvement. Poor recall of home exercises. Strength is a little better, but still with significant weakness. Gait speed has  improved, but still significantly below normal metrics. Patient continues to struggle with ankle mobility and remains pain limited. She has very poor control of RT ankle and demos cogwheel type movements with strength testing. She fatigues very quickly. At this point, we will try to progress functional strength to improve walking and balance. Encouraged patient to remain compliant with HEP and possibly increase frequency to try to improve mobility and ankle control. Patient will continue to benefit from skilled therapy services to address remaining deficits for reduced pain and improved LOF with ADLs.    OBJECTIVE IMPAIRMENTS Abnormal gait, decreased activity tolerance, decreased balance, decreased endurance, decreased mobility, difficulty walking, decreased ROM, decreased strength, hypomobility, increased edema, increased fascial restrictions, impaired flexibility, impaired sensation, improper body mechanics, and pain.   ACTIVITY LIMITATIONS standing, squatting, stairs, transfers, and locomotion level  PARTICIPATION LIMITATIONS: meal prep, cleaning, laundry, driving, shopping, community activity, and yard work  PERSONAL FACTORS Past/current experiences and Time since onset of injury/illness/exacerbation are also affecting patient's functional outcome.   REHAB POTENTIAL: Good  CLINICAL DECISION MAKING: Stable/uncomplicated  EVALUATION COMPLEXITY: Low   GOALS: SHORT TERM GOALS: Target date: 07/18/2022  Patient will be independent with initial HEP and self-management strategies to improve functional outcomes Baseline: 07/19/22:  Reports compliance with HEP daily Goal status: MET   2. Patient will have RT ankle DF AROM at least 5 degrees to improve functional mobility, gait mechanics and stair ambulation.  Baseline:-20 deg  Goal status: IN PROGRESS   LONG TERM GOALS: Target date: 08/15/2022  Patient will be independent with advanced HEP and self-management strategies to improve functional  outcomes Baseline: Goal status: IN PROGRESS  2.  Patient will improve FOTO score to predicted value to indicate improvement in functional outcomes Baseline: 38% function (decreased)  Goal status: IN PROGRESS  3.  Patient will have RT ankle DF AROM at least 10 degrees to improve functional mobility, gait mechanics and stair ambulation.  Baseline: -20 Goal status: IN PROGRESS  4. Patient will have equal to or > 4/5 MMT throughout RT ankle to improve ability to perform functional mobility, stair ambulation and ADLs.  Baseline: See MMT Goal status: IN PROGRESS  5. Patient will be able to ambulate at least 300 feet during 2MWT with LRAD to demonstrate improved ability to perform functional mobility and associated tasks. Baseline: 180 feet with SPC Goal status: IN PROGRESS   PLAN: PT FREQUENCY: 2x/week  PT DURATION: 6 weeks  PLANNED INTERVENTIONS: Therapeutic exercises, Therapeutic activity, Neuromuscular re-education, Balance training, Gait training, Patient/Family education, Joint manipulation, Joint mobilization, Stair training, Aquatic Therapy, Dry Needling, Electrical stimulation, Spinal manipulation, Spinal mobilization, Cryotherapy, Moist heat, scar mobilization, Taping, Traction, Ultrasound, Biofeedback, Ionotophoresis 30m/ml Dexamethasone, and Manual therapy.   PLAN FOR NEXT SESSION:  Progress ankle strength and mobility as tolerated. Begin standing ankle strengthening including rocker board, heel/toe raises.  10:53 AM, 08/22/22 CJosue HectorPT DPT  Physical Therapist [facility] ([phone]

## 2022-08-24 ENCOUNTER — Other Ambulatory Visit: Payer: Self-pay | Admitting: Orthopedic Surgery

## 2022-08-24 ENCOUNTER — Ambulatory Visit (INDEPENDENT_AMBULATORY_CARE_PROVIDER_SITE_OTHER): Payer: Medicare Other | Admitting: Orthopedic Surgery

## 2022-08-24 ENCOUNTER — Ambulatory Visit (INDEPENDENT_AMBULATORY_CARE_PROVIDER_SITE_OTHER): Payer: Medicare Other

## 2022-08-24 ENCOUNTER — Encounter (HOSPITAL_COMMUNITY): Payer: Medicare Other | Admitting: Physical Therapy

## 2022-08-24 ENCOUNTER — Encounter: Payer: Self-pay | Admitting: Orthopedic Surgery

## 2022-08-24 DIAGNOSIS — M6701 Short Achilles tendon (acquired), right ankle: Secondary | ICD-10-CM | POA: Diagnosis not present

## 2022-08-24 DIAGNOSIS — M25571 Pain in right ankle and joints of right foot: Secondary | ICD-10-CM

## 2022-08-24 DIAGNOSIS — G894 Chronic pain syndrome: Secondary | ICD-10-CM

## 2022-08-24 NOTE — Progress Notes (Signed)
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  Patient states she has been having pain with ambulation across the forefoot as well as ankle pain.  No pain across the midfoot.  Patient has worked with therapy remotely at [facility].  Assessment & Plan: Visit Diagnoses:  1. Pain in right ankle and joints of right foot     Plan: Patient was given instructions to continue with Achilles stretching.  We will set up additional therapy at [facility] to work on Achilles stretching and strengthening of the ankle.  A prescription was provided for [employer] for a carbon orthotic in the right foot status post midfoot fusion.  Patient states she does not have stiff soled supportive shoes.  Follow-Up Instructions: Return in about 4 weeks (around 09/21/2022).   Ortho Exam  Patient is alert, oriented, no adenopathy, well-dressed, normal affect, normal respiratory effort. Examination patient is a good dorsalis pedis pulse she has dorsiflexion to neutral with her knee extended.  The fusion site is stable the incision is well-healed.  Patient has pain beneath the metatarsal heads with ambulation and ankle pain with dorsiflexion of the ankle.  Patient has minimal range of motion of the great toe MTP joint.  Radiographs show cystic degenerative changes.  Imaging: XR Foot Complete Right  Result Date: 08/24/2022 Three-view radiographs of the right foot shows stable fusion across the Lisfranc complex.  Patient does have degenerative changes at  the MTP joint with a long second third and fourth metatarsal.  There is calcification at the Achilles insertion.  XR Ankle 2 Views Right  Result Date: 08/24/2022 2 view radiographs of the right ankle shows calcification at the Achilles insertion.  The tibial talar joint is congruent no degenerative changes.  No images are attached to the encounter.  Labs: Lab Results  Component Value Date   HGBA1C 5.5 02/19/2020   HGBA1C 5.8 (H) 01/25/2012   HGBA1C 5.7 07/13/2009   ESRSEDRATE 2 03/26/2018   ESRSEDRATE 4 03/16/2014   ESRSEDRATE 40 (H) 12/30/2012   CRP 0.8 03/26/2018   CRP <0.5 03/16/2014   CRP 1.6 (H) 12/30/2012   REPTSTATUS 08/13/2022 FINAL 08/10/2022   GRAMSTAIN  01/03/2013    RARE WBC PRESENT, PREDOMINANTLY MONONUCLEAR NO SQUAMOUS EPITHELIAL CELLS SEEN NO ORGANISMS SEEN   GRAMSTAIN  01/03/2013    RARE WBC PRESENT, PREDOMINANTLY PMN NO SQUAMOUS EPITHELIAL CELLS SEEN NO ORGANISMS SEEN   GRAMSTAIN  01/03/2013    RARE WBC PRESENT,BOTH PMN AND MONONUCLEAR NO SQUAMOUS EPITHELIAL CELLS SEEN NO ORGANISMS SEEN   GRAMSTAIN  01/03/2013    RARE WBC PRESENT, PREDOMINANTLY PMN NO SQUAMOUS EPITHELIAL CELLS SEEN NO ORGANISMS SEEN   GRAMSTAIN  01/03/2013    RARE WBC PRESENT,BOTH PMN AND MONONUCLEAR NO SQUAMOUS EPITHELIAL CELLS SEEN NO ORGANISMS SEEN   GRAMSTAIN  01/03/2013    RARE WBC PRESENT, PREDOMINANTLY  MONONUCLEAR NO SQUAMOUS EPITHELIAL CELLS SEEN NO ORGANISMS SEEN   CULT (A) 08/10/2022    >=100,000 COLONIES/mL ESCHERICHIA COLI 50,000 COLONIES/mL KLEBSIELLA PNEUMONIAE    LABORGA ESCHERICHIA COLI (A) 08/10/2022   LABORGA KLEBSIELLA PNEUMONIAE (A) 08/10/2022     Lab Results  Component Value Date   ALBUMIN 3.2 (L) 08/10/2022   ALBUMIN 3.5 02/18/2020   ALBUMIN 3.4 (L) 11/10/2019    Lab Results  Component Value Date   MG 1.9 08/12/2022   MG 1.9 08/10/2022   MG 1.7 02/18/2020   No results found for: "VD25OH"  No results found for: "PREALBUMIN"    Latest Ref Rng  & Units 08/12/2022    4:08 AM 08/10/2022    1:18 PM 08/10/2022    1:09 PM  CBC EXTENDED  WBC 4.0 - 10.5 K/uL 4.5   4.7   RBC 3.87 - 5.11 MIL/uL 3.90   3.71   Hemoglobin 12.0 - 15.0 g/dL 24.4   01.0   HCT 27.2 - 46.0 % 35.6   33.9   Platelets 150 - 400 K/uL 200   211   NEUT# 1.7 - 7.7 K/uL  1.8    Lymph# 0.7 - 4.0 K/uL  2.2       There is no height or weight on file to calculate BMI.  Orders:  Orders Placed This Encounter  Procedures   XR Foot Complete Right   XR Ankle 2 Views Right   No orders of the defined types were placed in this encounter.    Procedures: No procedures performed  Clinical Data: No additional findings.  ROS:  All other systems negative, except as noted in the HPI. Review of Systems  Objective: Vital Signs: There were no vitals taken for this visit.  Specialty Comments:  No specialty comments available.  PMFS History: Patient Active Problem List   Diagnosis Date Noted   Toxic metabolic encephalopathy 08/10/2022   Seizure (HCC) 08/10/2022   UTI (urinary tract infection) 08/10/2022   Primary osteoarthritis of right foot    Syncope 02/18/2020   Prolonged QT interval 02/18/2020   Glucose intolerance 02/18/2020   Normocytic anemia 12/10/2019   Poor appetite 09/24/2019   Abnormal weight loss 09/24/2019   Upper abdominal mass 09/24/2019   Dysphagia 01/30/2019   Simple adnexal cyst greater than 1 cm in diameter in postmenopausal patient 11/08/2018   Abdominal pain 08/09/2015   Dysphagia, pharyngoesophageal phase    H/O adenomatous polyp of colon 05/05/2015   Constipation 02/02/2015   Knee contusion 12/09/2013   Esophageal dysphagia 07/02/2013   Abdominal pain, epigastric 07/02/2013   Chronic pain syndrome 11/28/2012   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   Cellulitis 11/04/2012   Postoperative wound breakdown 11/04/2012   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   Knee pain 08/13/2012   Hypokalemia 08/01/2012   Cellulitis,  wound, post-operative 07/30/2012   Degenerative tear of medial meniscus of right knee 01/12/2012   OA (osteoarthritis) of knee 01/12/2012   Right knee sprain 11/02/2011   Instability of knee joint 03/29/2011   URTICARIA 12/21/2009   OTHER URINARY INCONTINENCE 07/18/2009   FOOT PAIN, LEFT 05/26/2009   DERMATITIS, CHRONIC 10/21/2008   IMPAIRED GLUCOSE TOLERANCE 09/09/2008   Chronic back pain 09/09/2008   Hypothyroidism 02/27/2008   Hyperlipidemia 02/27/2008   Anxiety with depression 02/27/2008   Bipolar I disorder, most recent episode depressed (HCC) 02/27/2008   Essential hypertension 02/27/2008   ALLERGIC RHINITIS 02/27/2008   GERD 02/27/2008   HEADACHE 02/27/2008   Past Medical  History:  Diagnosis Date   Allergic rhinitis    Anxiety    Arthritis    Cataracts, bilateral    Chronic back pain    Sees pain mamagement clinic   Chronic pain syndrome    Constipation    Depression    Essential hypertension    GERD (gastroesophageal reflux disease)    Headaches, cluster    History of seizures    Hypothyroid    Mania (HCC)    Mixed hyperlipidemia    PTSD (post-traumatic stress disorder)    Right lumbar radiculopathy    Sleep apnea    STOP BANG score=4   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(HCC)     Family History  Problem Relation Age of Onset   Alcohol abuse Father    Anxiety disorder Father    Alcohol abuse Mother    Anxiety disorder Mother    Alcohol abuse Brother    Anxiety disorder Brother    Drug abuse Brother    Drug abuse Brother    Alcohol abuse Brother    Anxiety disorder Brother    Seizures Brother    Alcohol abuse Brother    Anxiety disorder Brother    Seizures Brother    Alcohol abuse Brother    Anxiety disorder Brother    Alcohol abuse Brother    Anxiety disorder Brother    Breast cancer Sister    Bipolar disorder Sister    Breast cancer Sister    Dementia Paternal Aunt    ADD / ADHD Grandchild    ADD / ADHD Grandchild    Heart disease Other     Diabetes Other    Alcohol abuse Other    Thyroid disease Son    Thyroid disease Daughter    Depression Neg Hx    OCD Neg Hx    Paranoid behavior Neg Hx    Schizophrenia Neg Hx    Sexual abuse Neg Hx    Physical abuse Neg Hx    Colon cancer Neg Hx     Past Surgical History:  Procedure Laterality Date   BACK SURGERY     BILATERAL OOPHORECTOMY     CATARACT EXTRACTION W/PHACO Left 01/04/2016   Procedure: CATARACT EXTRACTION PHACO AND INTRAOCULAR LENS PLACEMENT (IOC);  Surgeon: Jethro Bolus, MD;  Location: [facility];  Service: Ophthalmology;  Laterality: Left;  CDE:4.70   CATARACT EXTRACTION W/PHACO Right 01/18/2016   Procedure: CATARACT EXTRACTION PHACO AND INTRAOCULAR LENS PLACEMENT RIGHT EYE; CDE:  4.66;  Surgeon: Jethro Bolus, MD;  Location: [facility];  Service: Ophthalmology;  Laterality: Right;   CHOLECYSTECTOMY     COLONOSCOPY  10/16/2008   YNW:[id number], otherwise normal rectum; pancolonic diverticula the remainder of the colonic mucosa appeared normal. Next TCS due 10/2013.   COLONOSCOPY  06/28/2006   RMR: Internal hemorrhoids. Diminutive rectal polyps, cold biopsied/ Pancolonic diverticula. Polyps in the right colon removed    COLONOSCOPY WITH PROPOFOL N/A 05/20/2015   RMR: Pancolonic diverticulosis. Redundant colon    COLONOSCOPY WITH PROPOFOL N/A 11/11/2020   Procedure: COLONOSCOPY WITH PROPOFOL;  Surgeon: Corbin Ade, MD;  Location: [facility];  Service: Endoscopy;  Laterality: N/A;  pm ASA 2   ESOPHAGOGASTRODUODENOSCOPY  10/16/2008   HQI:ONGEX hiatal hernia otherwise normal esophagus, stomach  D1, D2, status post passage of a 56-French Maloney dilator   ESOPHAGOGASTRODUODENOSCOPY  06/28/2006   BMW:UXLKGM esophagus. Small hiatal hernia. Otherwise normal stomach, D1 and D2, status post passage of a 56 [place] Maloney dilator   ESOPHAGOGASTRODUODENOSCOPY (EGD)  WITH ESOPHAGEAL DILATION N/A 07/14/2013   RMR: Abnormal esophagus of uncertain significance-status post esophageal biospy.  small hiatal hernia.    ESOPHAGOGASTRODUODENOSCOPY (EGD) WITH PROPOFOL N/A 05/20/2015   RMR: NORMAL EGD status post maloney dilation    ESOPHAGOGASTRODUODENOSCOPY (EGD) WITH PROPOFOL N/A 12/23/2018   Dr. Jena Gaussrourk: normal esophagus, s/p esophageal dilation.    FOOT ARTHRODESIS Right 03/31/2022   Procedure: RIGHT FOOT TARSAL-METATARSAL FUSION;  Surgeon: Nadara Mustarduda, Tylena Prisk V, MD;  [facility];  Service: Orthopedics;  Laterality: Right;   FOOT SURGERY     left foot-   GASTROCNEMIUS RECESSION Right 03/31/2022   Procedure: RIGHT ACHILLES LENGTHENING;  Surgeon: Nadara Mustarduda, Tayson Schnelle V, MD;  [facility];  Service: Orthopedics;  Laterality: Right;   INCISION AND DRAINAGE Right 01/03/2013   Procedure: INCISION AND DRAINAGE;  Surgeon: Vickki HearingStanley E Harrison, MD;  Location: [facility];  Service: Orthopedics;  Laterality: Right;   KNEE ARTHROSCOPY  01/2012   right   KNEE ARTHROSCOPY WITH LATERAL RELEASE Right 01/03/2013   Procedure: Lateral Release Patella Right Knee;  Surgeon: Vickki HearingStanley E Harrison, MD;  Location: [facility];  Service: Orthopedics;  Laterality: Right;   LAPAROSCOPIC LYSIS OF ADHESIONS N/A 11/12/2019   Procedure: LAPAROSCOPIC LYSIS OF ADHESIONS, extensive; drainage of peritoneal cyst;  Surgeon: Lazaro ArmsEure, Luther H, MD;  Location: [facility];  Service: Gynecology;  Laterality: N/A;   LUMBAR DISC SURGERY  L4-5   MALONEY DILATION N/A 05/20/2015   Procedure: Elease HashimotoMALONEY DILATION;  Surgeon: Corbin Adeobert M Rourk, MD;  Location: [facility];  Service: Endoscopy;  Laterality: N/AElease Hashimoto;  Maloney dilator # [id number]   MALONEY DILATION N/A 12/23/2018   Procedure: Elease HashimotoMALONEY DILATION;  Surgeon: Corbin Adeourk, Robert M, MD;  [facility]: [facility];  Service: Endoscopy;  Laterality: N/A;   PARTIAL HYSTERECTOMY     TOTAL KNEE ARTHROPLASTY  07/01/2012   Procedure: TOTAL KNEE ARTHROPLASTY;  Surgeon: Vickki HearingStanley E Harrison, MD;  Location: [facility];  Service: Orthopedics;  Laterality: Right;   TOTAL KNEE REVISION Right 01/03/2013   Procedure: Patellaplasty Right Knee;  Surgeon: Vickki HearingStanley E  Harrison, MD;  Location: [facility];  Service: Orthopedics;  Laterality: Right;   Social History   Occupational History   Occupation: disabled    Associate Professormployer: UNEMPLOYED  Tobacco Use   Smoking status: Former    Packs/day: 0.25    Years: 5.00    Total pack years: 1.25    Types: Cigarettes    Quit date: 06/26/2003    Years since quitting: 19.1   Smokeless tobacco: Never  Vaping Use   Vaping Use: Never used  Substance and Sexual Activity   Alcohol use: Not Currently    Comment: occasionally; once a month.   Drug use: Not Currently    Comment: has a past history of street drug use   Sexual activity: Yes    Birth control/protection: Surgical    Comment: hyst

## 2022-08-29 ENCOUNTER — Ambulatory Visit (HOSPITAL_COMMUNITY): Payer: Medicare Other | Admitting: Physical Therapy

## 2022-08-29 DIAGNOSIS — R262 Difficulty in walking, not elsewhere classified: Secondary | ICD-10-CM | POA: Diagnosis not present

## 2022-08-29 DIAGNOSIS — M25571 Pain in right ankle and joints of right foot: Secondary | ICD-10-CM

## 2022-08-29 NOTE — Therapy (Signed)
OUTPATIENT PHYSICAL THERAPY LOWER EXTREMITY TREATMENT   Patient Name: Diane [name] MRN: [id number] DOB:13-Dec-1950, 71 y.o., female Today's Date: 08/29/2022      PT End of Session - 08/29/22 1640     Visit Number 11    Number of Visits 22    Date for PT Re-Evaluation 10/03/22    Authorization Type [employer]    Progress Note Due on Visit 20    PT Start Time 1640    PT Stop Time 1725    PT Time Calculation (min) 45 min    Activity Tolerance Patient tolerated treatment well    Behavior During Therapy WFL for tasks assessed/performed                  Past Medical History:  Diagnosis Date   Allergic rhinitis    Anxiety    Arthritis    Cataracts, bilateral    Chronic back pain    Sees pain [facility]   Chronic pain syndrome    Constipation    Depression    Essential hypertension    GERD (gastroesophageal reflux disease)    Headaches, cluster    History of seizures    Hypothyroid    Mania ([place])    Mixed hyperlipidemia    PTSD (post-traumatic stress disorder)    Right lumbar radiculopathy    Sleep apnea    STOP BANG score=4   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[facility])    Past Surgical History:  Procedure Laterality Date   BACK SURGERY     BILATERAL OOPHORECTOMY     CATARACT EXTRACTION W/PHACO Left 01/04/2016   Procedure: CATARACT EXTRACTION PHACO AND INTRAOCULAR LENS PLACEMENT ([place]);  Surgeon: Rutherford Guys, MD;  Location: [facility];  Service: Ophthalmology;  Laterality: Left;  CDE:4.70   CATARACT EXTRACTION W/PHACO Right 01/18/2016   Procedure: CATARACT EXTRACTION PHACO AND INTRAOCULAR LENS PLACEMENT RIGHT EYE; CDE:  4.66;  Surgeon: Rutherford Guys, MD;  Location: [facility];  Service: Ophthalmology;  Laterality: Right;   CHOLECYSTECTOMY     COLONOSCOPY  10/16/2008   XVQ:[id number], otherwise normal rectum; pancolonic diverticula the remainder of the colonic mucosa appeared normal. Next TCS due 10/2013.   COLONOSCOPY  06/28/2006   RMR: Internal hemorrhoids.  Diminutive rectal polyps, cold biopsied/ Pancolonic diverticula. Polyps in the right colon removed    COLONOSCOPY WITH PROPOFOL N/A 05/20/2015   RMR: Pancolonic diverticulosis. Redundant colon    COLONOSCOPY WITH PROPOFOL N/A 11/11/2020   Procedure: COLONOSCOPY WITH PROPOFOL;  Surgeon: Daneil Dolin, MD;  [facility];  Service: Endoscopy;  Laterality: N/A;  pm ASA 2   ESOPHAGOGASTRODUODENOSCOPY  10/16/2008   TOI:ZTIWP hiatal hernia otherwise normal esophagus, stomach  D1, D2, status post passage of a 56-French Maloney dilator   ESOPHAGOGASTRODUODENOSCOPY  06/28/2006   YKD:XIPJAS esophagus. Small hiatal hernia. Otherwise normal stomach, D1 and D2, status post passage of a 56 French Maloney dilator   ESOPHAGOGASTRODUODENOSCOPY (EGD) WITH ESOPHAGEAL DILATION N/A 07/14/2013   RMR: Abnormal esophagus of uncertain significance-status post esophageal biospy. small hiatal hernia.    ESOPHAGOGASTRODUODENOSCOPY (EGD) WITH PROPOFOL N/A 05/20/2015   RMR: NORMAL EGD status post maloney dilation    ESOPHAGOGASTRODUODENOSCOPY (EGD) WITH PROPOFOL N/A 12/23/2018   Dr. Gala Romney: normal esophagus, s/p esophageal dilation.    FOOT ARTHRODESIS Right 03/31/2022   Procedure: RIGHT FOOT TARSAL-METATARSAL FUSION;  Surgeon: Newt Minion, MD;  Location: [place];  Service: Orthopedics;  Laterality: Right;   FOOT SURGERY     left foot-   GASTROCNEMIUS  RECESSION Right 03/31/2022   Procedure: RIGHT ACHILLES LENGTHENING;  Surgeon: Newt Minion, MD;  Location: [place];  Service: Orthopedics;  Laterality: Right;   INCISION AND DRAINAGE Right 01/03/2013   Procedure: INCISION AND DRAINAGE;  Surgeon: Carole Civil, MD;  Location: [facility];  Service: Orthopedics;  Laterality: Right;   KNEE ARTHROSCOPY  01/2012   right   KNEE ARTHROSCOPY WITH LATERAL RELEASE Right 01/03/2013   Procedure: Lateral Release Patella Right Knee;  Surgeon: Carole Civil, MD;  Location: [facility];  Service: Orthopedics;  Laterality: Right;    LAPAROSCOPIC LYSIS OF ADHESIONS N/A 11/12/2019   Procedure: LAPAROSCOPIC LYSIS OF ADHESIONS, extensive; drainage of peritoneal cyst;  Surgeon: Florian Buff, MD;  Location: [facility];  Service: Gynecology;  Laterality: N/A;   LUMBAR DISC SURGERY  L4-5   MALONEY DILATION N/A 05/20/2015   Procedure: Venia Minks DILATION;  Surgeon: Daneil Dolin, MD;  Location: [facility];  Service: Endoscopy;  Laterality: N/AVenia Minks dilator # 1   MALONEY DILATION N/A 12/23/2018   Procedure: Venia Minks DILATION;  Surgeon: Daneil Dolin, MD;  [facility];  Service: Endoscopy;  Laterality: N/A;   PARTIAL HYSTERECTOMY     TOTAL KNEE ARTHROPLASTY  07/01/2012   Procedure: TOTAL KNEE ARTHROPLASTY;  Surgeon: Carole Civil, MD;  Location: [facility];  Service: Orthopedics;  Laterality: Right;   TOTAL KNEE REVISION Right 01/03/2013   Procedure: Patellaplasty Right Knee;  Surgeon: Carole Civil, MD;  Location: [facility];  Service: Orthopedics;  Laterality: Right;   Patient Active Problem List   Diagnosis Date Noted   Toxic metabolic encephalopathy 48/88/9169   Seizure ([place]) 08/10/2022   UTI (urinary tract infection) 08/10/2022   Primary osteoarthritis of right foot    Syncope 02/18/2020   Prolonged QT interval 02/18/2020   Glucose intolerance 02/18/2020   Normocytic anemia 12/10/2019   Poor appetite 09/24/2019   Abnormal weight loss 09/24/2019   Upper abdominal mass 09/24/2019   Dysphagia 01/30/2019   Simple adnexal cyst greater than 1 cm in diameter in postmenopausal patient 11/08/2018   Abdominal pain 08/09/2015   Dysphagia, pharyngoesophageal phase    H/O adenomatous polyp of colon 05/05/2015   Constipation 02/02/2015   Knee contusion 12/09/2013   Esophageal dysphagia 07/02/2013   Abdominal pain, epigastric 07/02/2013   Chronic pain syndrome 11/28/2012   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   Cellulitis 11/04/2012   Postoperative wound breakdown 11/04/2012   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  08/13/2012   Knee pain 08/13/2012   Hypokalemia 08/01/2012   Cellulitis, wound, post-operative 07/30/2012   Degenerative tear of medial meniscus of right knee 01/12/2012   OA (osteoarthritis) of knee 01/12/2012   Right knee sprain 11/02/2011   Instability of knee joint 03/29/2011   URTICARIA 12/21/2009   OTHER URINARY INCONTINENCE 07/18/2009   FOOT PAIN, LEFT 05/26/2009   DERMATITIS, CHRONIC 10/21/2008   IMPAIRED GLUCOSE TOLERANCE 09/09/2008   Chronic back pain 09/09/2008   Hypothyroidism 02/27/2008   Hyperlipidemia 02/27/2008   Anxiety with depression 02/27/2008   Bipolar I disorder, most recent episode depressed ([place]) 02/27/2008   Essential hypertension 02/27/2008   ALLERGIC RHINITIS 02/27/2008   GERD 02/27/2008   HEADACHE 02/27/2008    PCP: Redmond School MD  REFERRING PROVIDER: Newt Minion, MD  Next apt: 08/26/22  REFERRING DIAG: I50.388E (ICD-10-CM) - Sprain of tarsometatarsal ligament of right foot, subsequent encounter   THERAPY DIAG:  Pain in right ankle and joints of right foot  Difficulty  in walking, not elsewhere classified  Rationale for Evaluation and Treatment Rehabilitation  ONSET DATE: 03/31/22  SUBJECTIVE:   SUBJECTIVE STATEMENT: Pt states she's been napping today.  Currently 7/10. Pt chuckled when asked about HEP progression without sound answer given.  Admits to only doing exercises several times a week and lays in bed a lot to keep the swelling down.    Eval: Patient presents with RT foot and ankle pain s/p RT metatarsal fusion on 03/31/20. She was having trouble for several months and they thought it was a sprain. She wore boot for 6 weeks but did not get any better. She was sent for surgery. She is still having trouble walking, still having pain. She says her sx are no better since surgery. She has not had formal therapy for this. She is still taking pain medication and she is walking with a SPC.   PERTINENT HISTORY: Arthritis, HTN,  anxiety  PAIN:  Are you having pain? Yes: NPRS scale: 7/10 Pain location: RT dorsal foot down to great toe  Pain description: throbbing, stinging Aggravating factors: standing, walking Relieving factors: meds, soaking in epsom salt   PRECAUTIONS: None  WEIGHT BEARING RESTRICTIONS No  FALLS:  Has patient fallen in last 6 months? Yes. Number of falls 2  LIVING ENVIRONMENT: Lives with: lives with their partner Lives in: House/apartment Stairs: No Has following equipment at home: Single point cane and Environmental consultant - 2 wheeled  OCCUPATION: Retired   PLOF: Independent with basic ADLs  PATIENT GOALS Decrease pain and swelling    OBJECTIVE:   DIAGNOSTIC FINDINGS: NA  PATIENT SURVEYS:  FOTO 38% (was 45% function)  COGNITION:  Overall cognitive status: Within functional limits for tasks assessed     SENSATION: Reports numbness/ tingling in RT foot   EDEMA:  Min/ mod edema in RT foot   PALPATION: Hyper sensitive about incision and top of RT foot  LOWER EXTREMITY ROM:  Active ROM Right eval Left eval Right 08/22/22 Left 08/22/22  Hip flexion      Hip extension      Hip abduction      Hip adduction      Hip internal rotation      Hip external rotation      Knee flexion      Knee extension      Ankle dorsiflexion -6 4 -20 (has 5 deg PROM) 3  Ankle plantarflexion _0 48  Ankle inversion  18    Ankle eversion  12     (Blank rows = not tested)  LOWER EXTREMITY MMT:  MMT Right eval Left eval Right 07/19/22 Right 08/22/22 Left 08/22/22  Hip flexion       Hip extension       Hip abduction       Hip adduction       Hip internal rotation       Hip external rotation       Knee flexion       Knee extension       Ankle dorsiflexion 3-  2+ * 3+ 4  Ankle plantarflexion       Ankle inversion 3-  3-* 3+ 4  Ankle eversion 3-  3-* 3- 4   (Blank rows = not tested)  *=pain     FUNCTIONAL TESTS:  2 minute walk test: 180 feet  GAIT: Distance walked: 180  feet  Assistive device utilized: Single point cane Level of assistance: Modified independence Comments: decreased stride, antalgic, decreased stance  on RT    TODAY'S TREATMENT: 08/29/22 Standing:  heelraises 10X (minimal Rt heel rise)  Toeraises 10X (minimal toe rise)  Rockerboard A/P and Rt/Lt 10X each with bil UE support  4 inch step up forward Rt 10X with 1 UE assist  4 inch lateral step up Rt 10X with 1 UE assist  Slant board stretch 4X30"  Tandem stance Rt and LT lead 1 minute (max 5 sec either LE without UE assist)  08/22/22 Reassess FOTO AROM MMT 2 MWT  Seated calf stretch 2x 30" Ankle band 4 way GTB x 10 each  08/10/22 STW and man ROM to right foot and ankle and big toe x 10'; no other treatment performed during manual treatment.   [place] pick up x 5' Heel slides x 10 on box Dorsiflexion stretch with towel 5 x 10" 4 way RTB 10 x each Ball roll for DF/PF x 2'   08/08/22: Isometric 10x5" pushing into ball BAPS L3 DF/PF; Inv/Ev; CW/CCW RTB 4 way 10x 5" each Towel gastroc stretch 2x 30"    PATIENT EDUCATION:  Education details: on eval findings, POC and HEP  Person educated: Patient Education method: Explanation Education comprehension: verbalized understanding   HOME EXERCISE PROGRAM: Access Code: [email or web address] URL: [email or web address] 07/04/22 - Seated Ankle Plantar Flexion with Resistance Loop  - 2 x daily - 7 x weekly - 2 sets - 10 reps - Seated Ankle Inversion with Resistance  - 2 x daily - 7 x weekly - 2 sets - 10 reps - Seated Ankle Eversion with Resistance  - 2 x daily - 7 x weekly - 2 sets - 10 reps - Seated Ankle Dorsiflexion with Resistance  - 2 x daily - 7 x weekly - 2 sets - 10 reps  Date: 06/20/2022 Prepared by: Josue Hector  Exercises - Towel Scrunches  - 3 x daily - 7 x weekly - 3 sets - 10 reps - Ankle Inversion Eversion Towel Slide  - 3 x daily - 7 x weekly - 3 sets - 10 reps - Seated Ankle Circles  - 3 x daily - 7  x weekly - 3 sets - 10 reps - Seated Calf Stretch with Strap  - 3 x daily - 7 x weekly - 1 sets - 4 reps - 30 second hold  ASSESSMENT:  CLINICAL IMPRESSION: Presents with poor recall of HEP and poor performance with all instructed exercises this session.  Max cues for posturing as tends to forward bend and presents with antalgic gait.  Very little ROM achieved with standing heel raises and practically no movement with standing toe raises with either LE. Patient admits to doing very little at home and mostly laying in bed.  Explained/educated pt that it has been 5 months and she should be showing more improvements and needs to increase her compliance and activity outside of therapy. PT fatigues very quickly requiring frequent rest breaks.  Very slow mobility and poor effort exerted with therex.  Patient will continue to benefit from skilled therapy services to address remaining deficits for reduced pain and improved LOF with ADLs.    OBJECTIVE IMPAIRMENTS Abnormal gait, decreased activity tolerance, decreased balance, decreased endurance, decreased mobility, difficulty walking, decreased ROM, decreased strength, hypomobility, increased edema, increased fascial restrictions, impaired flexibility, impaired sensation, improper body mechanics, and pain.   ACTIVITY LIMITATIONS standing, squatting, stairs, transfers, and locomotion level  PARTICIPATION LIMITATIONS: meal prep, cleaning, laundry, driving, shopping, community activity, and yard work  PERSONAL FACTORS Past/current experiences and Time since  onset of injury/illness/exacerbation are also affecting patient's functional outcome.   REHAB POTENTIAL: Good  CLINICAL DECISION MAKING: Stable/uncomplicated  EVALUATION COMPLEXITY: Low   GOALS: SHORT TERM GOALS: Target date: 07/18/2022  Patient will be independent with initial HEP and self-management strategies to improve functional outcomes Baseline: 07/19/22:  Reports compliance with HEP  daily Goal status: MET   2. Patient will have RT ankle DF AROM at least 5 degrees to improve functional mobility, gait mechanics and stair ambulation.  Baseline:-20 deg Goal status: IN PROGRESS   LONG TERM GOALS: Target date: 08/15/2022  Patient will be independent with advanced HEP and self-management strategies to improve functional outcomes Baseline: Goal status: IN PROGRESS  2.  Patient will improve FOTO score to predicted value to indicate improvement in functional outcomes Baseline: 38% function (decreased)  Goal status: IN PROGRESS  3.  Patient will have RT ankle DF AROM at least 10 degrees to improve functional mobility, gait mechanics and stair ambulation.  Baseline: -20 Goal status: IN PROGRESS  4. Patient will have equal to or > 4/5 MMT throughout RT ankle to improve ability to perform functional mobility, stair ambulation and ADLs.  Baseline: See MMT Goal status: IN PROGRESS  5. Patient will be able to ambulate at least 300 feet during 2MWT with LRAD to demonstrate improved ability to perform functional mobility and associated tasks. Baseline: 180 feet with SPC Goal status: IN PROGRESS   PLAN: PT FREQUENCY: 2x/week  PT DURATION: 6 weeks  PLANNED INTERVENTIONS: Therapeutic exercises, Therapeutic activity, Neuromuscular re-education, Balance training, Gait training, Patient/Family education, Joint manipulation, Joint mobilization, Stair training, Aquatic Therapy, Dry Needling, Electrical stimulation, Spinal manipulation, Spinal mobilization, Cryotherapy, Moist heat, scar mobilization, Taping, Traction, Ultrasound, Biofeedback, Ionotophoresis 42m/ml Dexamethasone, and Manual therapy.   PLAN FOR NEXT SESSION:  Progress ankle strength and mobility as tolerated. Push standing and more functional strengthening.   5:46 PM, 08/29/22 ATeena Irani PTA/CLT CAldanPh: [phone]

## 2022-08-31 ENCOUNTER — Other Ambulatory Visit: Payer: Self-pay | Admitting: Orthopedic Surgery

## 2022-08-31 ENCOUNTER — Telehealth (HOSPITAL_COMMUNITY): Payer: Medicare Other | Admitting: Psychiatry

## 2022-08-31 ENCOUNTER — Encounter (HOSPITAL_COMMUNITY): Payer: Medicare Other | Admitting: Physical Therapy

## 2022-08-31 DIAGNOSIS — T50915A Adverse effect of multiple unspecified drugs, medicaments and biological substances, initial encounter: Secondary | ICD-10-CM | POA: Diagnosis not present

## 2022-08-31 DIAGNOSIS — E039 Hypothyroidism, unspecified: Secondary | ICD-10-CM | POA: Diagnosis not present

## 2022-08-31 DIAGNOSIS — M84474S Pathological fracture, right foot, sequela: Secondary | ICD-10-CM | POA: Diagnosis not present

## 2022-08-31 DIAGNOSIS — Z0001 Encounter for general adult medical examination with abnormal findings: Secondary | ICD-10-CM | POA: Diagnosis not present

## 2022-08-31 DIAGNOSIS — K5903 Drug induced constipation: Secondary | ICD-10-CM | POA: Diagnosis not present

## 2022-08-31 DIAGNOSIS — G894 Chronic pain syndrome: Secondary | ICD-10-CM

## 2022-08-31 DIAGNOSIS — R296 Repeated falls: Secondary | ICD-10-CM | POA: Diagnosis not present

## 2022-08-31 DIAGNOSIS — G8929 Other chronic pain: Secondary | ICD-10-CM | POA: Diagnosis not present

## 2022-09-05 ENCOUNTER — Telehealth (INDEPENDENT_AMBULATORY_CARE_PROVIDER_SITE_OTHER): Payer: Medicare Other | Admitting: Psychiatry

## 2022-09-05 ENCOUNTER — Encounter (HOSPITAL_COMMUNITY): Payer: Self-pay | Admitting: Psychiatry

## 2022-09-05 DIAGNOSIS — F5105 Insomnia due to other mental disorder: Secondary | ICD-10-CM | POA: Diagnosis not present

## 2022-09-05 DIAGNOSIS — F3130 Bipolar disorder, current episode depressed, mild or moderate severity, unspecified: Secondary | ICD-10-CM

## 2022-09-05 MED ORDER — DULOXETINE HCL 60 MG PO CPEP: 60.0000 mg | ORAL_CAPSULE | Freq: Two times a day (BID) | ORAL | 2 refills | Status: DC

## 2022-09-05 MED ORDER — TRAZODONE HCL 50 MG PO TABS: 50.0000 mg | ORAL_TABLET | Freq: Every day | ORAL | 2 refills | Status: DC

## 2022-09-05 MED ORDER — ALPRAZOLAM 0.5 MG PO TABS: 0.5000 mg | ORAL_TABLET | Freq: Three times a day (TID) | ORAL | 2 refills | Status: DC

## 2022-09-05 MED ORDER — DIVALPROEX SODIUM 500 MG PO DR TAB: 500.0000 mg | DELAYED_RELEASE_TABLET | Freq: Every day | ORAL | 0 refills | Status: DC

## 2022-09-05 NOTE — Progress Notes (Signed)
Virtual Visit via Telephone Note  I connected with Diane [name] on 09/05/22 at  2:00 PM EST by telephone and verified that I am speaking with the correct person using two identifiers.  Location: Patient: home Provider: home office   I discussed the limitations, risks, security and privacy concerns of performing an evaluation and management service by telephone and the availability of in person appointments. I also discussed with the patient that there may be a patient responsible charge related to this service. The patient expressed understanding and agreed to proceed.       I discussed the assessment and treatment plan with the patient. The patient was provided an opportunity to ask questions and all were answered. The patient agreed with the plan and demonstrated an understanding of the instructions.   The patient was advised to call back or seek an in-person evaluation if the symptoms worsen or if the condition fails to improve as anticipated.  I provided 15 minutes of non-face-to-face time during this encounter.   [name], MD  [facility] MD/PA/NP OP Progress Note  09/05/2022 2:25 PM Diane [name]  MRN:  [id number]  Chief Complaint:  Chief Complaint  Patient presents with   Depression   Anxiety   Follow-up   HPI: This patient is a 71 year old black female who is living alone in [place].  She is on disability.  The patient returns for follow-up after 3 months regarding her mood swings depression and anxiety.  Since I last saw her she was hospitalized in November for altered mental status.  She had a urinary tract infection but had also taken the Xanax Flexeril and pain medication together.  This caused her to be somewhat confused.  She has been warned not to do this again.  Also her Depakote level was low and it was increased to 500 mg daily.  The patient had foot surgery back last July and is still having a lot of pain.  She does not think the physical therapy has helped and in  fact it makes her feel worse.  She does take Percocet but it does not do much for her.  She states that the pain and immobility is really getting to her and she is getting more depressed.  At times she has had fleeting suicidal thoughts but no plan.  She has to be referred back to therapy and I think this is a reasonable idea as well as increasing her antidepressant. Visit Diagnosis:    ICD-10-CM   1. Bipolar I disorder, most recent episode depressed (HCC)  F31.30     2. 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  At that time she was using drugs and alcohol.  She has been in outpatient care ever since  Past Medical History:  Past Medical History:  Diagnosis Date   Allergic rhinitis    Anxiety    Arthritis    Cataracts, bilateral    Chronic back pain    Sees pain mamagement clinic   Chronic pain syndrome    Constipation    Depression    Essential hypertension    GERD (gastroesophageal reflux disease)    Headaches, cluster    History of seizures    Hypothyroid    Mania (HCC)    Mixed hyperlipidemia    PTSD (post-traumatic stress disorder)  Right lumbar radiculopathy    Sleep apnea    STOP BANG score=4   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[facility])     Past Surgical History:  Procedure Laterality Date   BACK SURGERY     BILATERAL OOPHORECTOMY     CATARACT EXTRACTION W/PHACO Left 01/04/2016   Procedure: CATARACT EXTRACTION PHACO AND INTRAOCULAR LENS PLACEMENT (IOC);  Surgeon: Jethro Bolus, MD;  Location: [facility];  Service: Ophthalmology;  Laterality: Left;  CDE:4.70   CATARACT EXTRACTION W/PHACO Right 01/18/2016   Procedure: CATARACT EXTRACTION PHACO AND INTRAOCULAR LENS PLACEMENT RIGHT EYE; CDE:  4.66;  Surgeon: Jethro Bolus, MD;  Location: [facility];  Service: Ophthalmology;  Laterality: Right;   CHOLECYSTECTOMY      COLONOSCOPY  10/16/2008   OQH:[id number], otherwise normal rectum; pancolonic diverticula the remainder of the colonic mucosa appeared normal. Next TCS due 10/2013.   COLONOSCOPY  06/28/2006   RMR: Internal hemorrhoids. Diminutive rectal polyps, cold biopsied/ Pancolonic diverticula. Polyps in the right colon removed    COLONOSCOPY WITH PROPOFOL N/A 05/20/2015   RMR: Pancolonic diverticulosis. Redundant colon    COLONOSCOPY WITH PROPOFOL N/A 11/11/2020   Procedure: COLONOSCOPY WITH PROPOFOL;  Surgeon: Corbin Ade, MD;  [facility];  Service: Endoscopy;  Laterality: N/A;  pm ASA 2   ESOPHAGOGASTRODUODENOSCOPY  10/16/2008   SFK:CLEXN hiatal hernia otherwise normal esophagus, stomach  D1, D2, status post passage of a 56-French Maloney dilator   ESOPHAGOGASTRODUODENOSCOPY  06/28/2006   TZG:YFVCBS esophagus. Small hiatal hernia. Otherwise normal stomach, D1 and D2, status post passage of a 56 French Maloney dilator   ESOPHAGOGASTRODUODENOSCOPY (EGD) WITH ESOPHAGEAL DILATION N/A 07/14/2013   RMR: Abnormal esophagus of uncertain significance-status post esophageal biospy. small hiatal hernia.    ESOPHAGOGASTRODUODENOSCOPY (EGD) WITH PROPOFOL N/A 05/20/2015   RMR: NORMAL EGD status post maloney dilation    ESOPHAGOGASTRODUODENOSCOPY (EGD) WITH PROPOFOL N/A 12/23/2018   Dr. Jena Gauss: normal esophagus, s/p esophageal dilation.    FOOT ARTHRODESIS Right 03/31/2022   Procedure: RIGHT FOOT TARSAL-METATARSAL FUSION;  Surgeon: Nadara Mustard, MD;  Location: [facility] OR;  Service: Orthopedics;  Laterality: Right;   FOOT SURGERY     left foot-   GASTROCNEMIUS RECESSION Right 03/31/2022   Procedure: RIGHT ACHILLES LENGTHENING;  Surgeon: Nadara Mustard, MD;  Location: [facility] OR;  Service: Orthopedics;  Laterality: Right;   INCISION AND DRAINAGE Right 01/03/2013   Procedure: INCISION AND DRAINAGE;  Surgeon: Vickki Hearing, MD;  Location: [facility];  Service: Orthopedics;  Laterality: Right;   KNEE ARTHROSCOPY  01/2012    right   KNEE ARTHROSCOPY WITH LATERAL RELEASE Right 01/03/2013   Procedure: Lateral Release Patella Right Knee;  Surgeon: Vickki Hearing, MD;  Location: [facility];  Service: Orthopedics;  Laterality: Right;   LAPAROSCOPIC LYSIS OF ADHESIONS N/A 11/12/2019   Procedure: LAPAROSCOPIC LYSIS OF ADHESIONS, extensive; drainage of peritoneal cyst;  Surgeon: Lazaro Arms, MD;  Location: [facility];  Service: Gynecology;  Laterality: N/A;   LUMBAR DISC SURGERY  L4-5   MALONEY DILATION N/A 05/20/2015   Procedure: Elease Hashimoto DILATION;  Surgeon: Corbin Ade, MD;  Location: [facility];  Service: Endoscopy;  Laterality: N/AElease Hashimoto dilator # 54   MALONEY DILATION N/A 12/23/2018   Procedure: Elease Hashimoto DILATION;  Surgeon: Corbin Ade, MD;  [facility];  Service: Endoscopy;  Laterality: N/A;   PARTIAL HYSTERECTOMY     TOTAL KNEE ARTHROPLASTY  07/01/2012   Procedure: TOTAL KNEE ARTHROPLASTY;  Surgeon: Vickki Hearing, MD;  Location: [facility];  Service: Orthopedics;  Laterality: Right;   TOTAL KNEE REVISION Right 01/03/2013   Procedure: Patellaplasty Right Knee;  Surgeon: Vickki Hearing, MD;  Location: [facility];  Service: Orthopedics;  Laterality: Right;    Family Psychiatric History: See below  Family History:  Family History  Problem Relation Age of Onset   Alcohol abuse Father    Anxiety disorder Father    Alcohol abuse Mother    Anxiety disorder Mother    Alcohol abuse Brother    Anxiety disorder Brother    Drug abuse Brother    Drug abuse Brother    Alcohol abuse Brother    Anxiety disorder Brother    Seizures Brother    Alcohol abuse Brother    Anxiety disorder Brother    Seizures Brother    Alcohol abuse Brother    Anxiety disorder Brother    Alcohol abuse Brother    Anxiety disorder Brother    Breast cancer Sister    Bipolar disorder Sister    Breast cancer Sister    Dementia Paternal Aunt    ADD / ADHD Grandchild    ADD / ADHD Grandchild    Heart disease Other     Diabetes Other    Alcohol abuse Other    Thyroid disease Son    Thyroid disease Daughter    Depression Neg Hx    OCD Neg Hx    Paranoid behavior Neg Hx    Schizophrenia Neg Hx    Sexual abuse Neg Hx    Physical abuse Neg Hx    Colon cancer Neg Hx     Social History:  Social History   Socioeconomic History   Marital status: Legally Separated    Spouse name: Not on file   Number of children: Not on file   Years of education: 12th grade   Highest education level: Not on file  Occupational History   Occupation: disabled    Employer: UNEMPLOYED  Tobacco Use   Smoking status: Former    Packs/day: 0.25    Years: 5.00    Total pack years: 1.25    Types: Cigarettes    Quit date: 06/26/2003    Years since quitting: 19.2   Smokeless tobacco: Never  Vaping Use   Vaping Use: Never used  Substance and Sexual Activity   Alcohol use: Not Currently    Comment: occasionally; once a month.   Drug use: Not Currently    Comment: has a past history of street drug use   Sexual activity: Yes    Birth control/protection: Surgical    Comment: hyst  Other Topics Concern   Not on file  Social History Narrative   Not on file   Social Determinants of Health   Financial Resource Strain: Low Risk  (01/24/2022)   Overall Financial Resource Strain (CARDIA)    Difficulty of Paying Living Expenses: Not hard at all  Food Insecurity: No Food Insecurity (08/10/2022)   Hunger Vital Sign    Worried About Running Out of Food in the Last Year: Never true    Ran Out of Food in the Last Year: Never true  Transportation Needs: No Transportation Needs (08/10/2022)   PRAPARE - Administrator, Civil Service (Medical): No    Lack of Transportation (Non-Medical): No  Physical Activity: Inactive (01/24/2022)   Exercise Vital Sign    Days of Exercise per Week: 0 days    Minutes of Exercise per Session: 0 min  Stress: No Stress Concern Present (01/24/2022)   [employer] - Occupational Stress Questionnaire    Feeling of Stress : Only a little  Social Connections: Socially Isolated (01/24/2022)   Social Connection and Isolation Panel [NHANES]    Frequency of Communication with Friends and Family: Once a week    Frequency of Social Gatherings with Friends and Family: Once a week    Attends Religious Services: 1 to 4 times per year    Active Member of [employer] or Organizations: No    Attends BankerClub or Organization Meetings: Never    Marital Status: Separated    Allergies:  Allergies  Allergen Reactions   Neomycin-Bacitracin Zn-Polymyx Other (See Comments)    Reaction: skin starts to become raw and peels off   Penicillins Hives   Sulfonamide Derivatives Hives and Swelling    Metabolic Disorder Labs: Lab Results  Component Value Date   HGBA1C 5.5 02/19/2020   MPG 111.15 02/19/2020   MPG 120 (H) 01/25/2012   No results found for: "PROLACTIN" Lab Results  Component Value Date   CHOL  12/10/2010    69        ATP III CLASSIFICATION:  <200     mg/dL   Desirable  [id number]  mg/dL   Borderline High  >=045>=240    mg/dL   High          TRIG 38 12/10/2010   HDL 28 (L) 12/10/2010   CHOLHDL 2.5 12/10/2010   VLDL 8 12/10/2010   LDLCALC  12/10/2010    33        Total Cholesterol/HDL:CHD Risk Coronary Heart Disease Risk Table                     Men   Women  1/2 Average Risk   3.4   3.3  Average Risk       5.0   4.4  2 X Average Risk   9.6   7.1  3 X Average Risk  23.4   11.0        Use the calculated Patient Ratio above and the CHD Risk Table to determine the patient's CHD Risk.        ATP III CLASSIFICATION (LDL):  <100     mg/dL   Optimal  [id number]  mg/dL   Near or Above                    Optimal  130-159  mg/dL   Borderline  [id number]  mg/dL   High  >956>190     mg/dL   Very High   LDLCALC 71 07/12/2009   Lab Results  Component Value Date   TSH 7.105 (H) 02/18/2020   TSH 0.52 02/11/2018    Therapeutic Level Labs: No results found for:  "LITHIUM" Lab Results  Component Value Date   VALPROATE 38 (L) 08/10/2022   VALPROATE 70.7 09/24/2018   No results found for: "CBMZ"  Current Medications: Current Outpatient Medications  Medication Sig Dispense Refill   cyclobenzaprine (FLEXERIL) 10 MG tablet Take 10 mg by mouth 3 (three) times daily as needed.     ALPRAZolam (XANAX) 0.5 MG tablet Take 1 tablet (0.5 mg total) by mouth 3 (three) times daily. 90 tablet 2   atorvastatin (LIPITOR) 20 MG tablet Take 20 mg by mouth daily.     divalproex (DEPAKOTE) 500 MG DR tablet Take 1 tablet (500 mg total) by mouth at bedtime. 30 tablet 0  DULoxetine (CYMBALTA) 60 MG capsule Take 1 capsule (60 mg total) by mouth 2 (two) times daily. 60 capsule 2   hydrocortisone (ANUSOL-HC) 2.5 % rectal cream Place 1 application rectally 2 (two) times daily. (Patient not taking: Reported on 08/10/2022) 30 g 1   levothyroxine (SYNTHROID) 100 MCG tablet Take 100 mcg by mouth daily before breakfast.     lisinopril (PRINIVIL,ZESTRIL) 10 MG tablet Take 10 mg by mouth at bedtime.      lubiprostone (AMITIZA) 24 MCG capsule TAKE ONE CAPSULE BY MOUTH TWICE DAILY WITH A MEAL (Patient not taking: Reported on 08/10/2022) 60 capsule 11   Naldemedine Tosylate (SYMPROIC) 0.2 MG TABS Take 0.2 mg by mouth daily. (Patient not taking: Reported on 08/10/2022)     omeprazole (PRILOSEC) 20 MG capsule TAKE ONE CAPSULE BY MOUTH TWICE DAILY BEFORE A MEAL. 180 capsule 1   oxybutynin (DITROPAN-XL) 10 MG 24 hr tablet Take 10 mg by mouth at bedtime.     oxyCODONE-acetaminophen (PERCOCET) 7.5-325 MG tablet TAKE ONE TABLET BY MOUTH EVERY 8 HOURS AS NEEDED FOR SEVERE PAIN. 20 tablet 0   traZODone (DESYREL) 50 MG tablet Take 1 tablet (50 mg total) by mouth at bedtime. 30 tablet 2   No current facility-administered medications for this visit.     Musculoskeletal: Strength & Muscle Tone: na Gait & Station: na Patient leans: N/A  Psychiatric Specialty Exam: Review of Systems   Constitutional:  Positive for fatigue.  Musculoskeletal:  Positive for arthralgias, gait problem and joint swelling.  Psychiatric/Behavioral:  Positive for dysphoric mood.     There were no vitals taken for this visit.There is no height or weight on file to calculate BMI.  General Appearance: NA  Eye Contact:  NA  Speech:  Clear and Coherent  Volume:  Decreased  Mood:  Depressed  Affect:  NA  Thought Process:  Goal Directed  Orientation:  Full (Time, Place, and Person)  Thought Content: Rumination   Suicidal Thoughts:  No  Homicidal Thoughts:  No  Memory:  Immediate;   Good Recent;   Good Remote;   Fair  Judgement:  Fair  Insight:  Fair  Psychomotor Activity:  Decreased  Concentration:  Concentration: Fair and Attention Span: Fair  Recall:  Fair  Fund of Knowledge: Good  Language: Good  Akathisia:  No  Handed:  Right  AIMS (if indicated): not done  Assets:  Communication Skills Desire for Improvement Resilience  ADL's:  Intact  Cognition: WNL  Sleep:  Fair   Screenings: GAD-7    Flowsheet Row Office Visit from 01/24/2022 in [facility]  Total GAD-7 Score 16      PHQ2-9    Flowsheet Row [facility] Video Visit from 06[facility]01/2022 in [facility] Video Visit from 02/28/2022 in [facility] Office Visit from 01/24/2022 in [facility] Video Visit from 11/28/2021 in [facility]  PHQ-2 Total Score 6 1 1  0 2  PHQ-9 Total Score 16 -- -- 8 7      Flowsheet Row Video Visit from 09/05/2022 [facility] [facility] Hosp-Admission (Discharged) from 08/10/2022 in [facility] Video Visit from 06/01/2022 [facility]  C-SSRS RISK CATEGORY Error: Q3, 4, or 5 should  not be populated when Q2 is No No Risk No Risk        Assessment and Plan: This  patient is a 71 year old female with a history of depression anxiety chronic pain and a remote history of substance abuse.  She is in a lot of pain and I suggested she speak to her foot doctor about pain management.  For now however we will increase Cymbalta to 60 mg twice daily for depression.  She will continue Depakote 500 mg daily for mood stabilization Xanax 0.5 mg 3 times daily for anxiety and trazodone 50 mg at bedtime for sleep.  She will return to see me in 2 months  Collaboration of Care: Collaboration of Care: Referral or follow-up with counselor/therapist AEB patient will be referred back to [facility] for therapy  Patient/Guardian was advised Release of Information must be obtained prior to any record release in order to collaborate their care with an outside provider. Patient/Guardian was advised if they have not already done so to contact the registration department to sign all necessary forms in order for [place] to release information regarding their care.   Consent: Patient/Guardian gives verbal consent for treatment and assignment of benefits for services provided during this visit. Patient/Guardian expressed understanding and agreed to proceed.    [name], MD 09/05/2022, 2:25 PM

## 2022-09-07 ENCOUNTER — Other Ambulatory Visit: Payer: Self-pay | Admitting: Orthopedic Surgery

## 2022-09-07 DIAGNOSIS — G894 Chronic pain syndrome: Secondary | ICD-10-CM

## 2022-09-12 ENCOUNTER — Encounter (HOSPITAL_COMMUNITY): Payer: Self-pay

## 2022-09-12 ENCOUNTER — Ambulatory Visit (HOSPITAL_COMMUNITY): Payer: 59 | Attending: Orthopedic Surgery

## 2022-09-12 ENCOUNTER — Telehealth (HOSPITAL_COMMUNITY): Payer: Self-pay

## 2022-09-12 DIAGNOSIS — M25571 Pain in right ankle and joints of right foot: Secondary | ICD-10-CM | POA: Insufficient documentation

## 2022-09-12 DIAGNOSIS — R262 Difficulty in walking, not elsewhere classified: Secondary | ICD-10-CM | POA: Diagnosis not present

## 2022-09-12 NOTE — Therapy (Signed)
OUTPATIENT PHYSICAL THERAPY LOWER EXTREMITY TREATMENT   Patient Name: Diane [name] MRN: [id number] DOB:Dec 30, 1950, 72 y.o., female Today's Date: 09/12/2022      PT End of Session - 09/12/22 1405     Visit Number 12    Number of Visits 22    Date for PT Re-Evaluation 10/03/22    Authorization Type [employer] Medicare    Progress Note Due on Visit 5    PT Start Time 1410   late arrival then restroom break   PT Stop Time 1430    PT Time Calculation (min) 20 min    Activity Tolerance Patient tolerated treatment well    Behavior During Therapy WFL for tasks assessed/performed                  Past Medical History:  Diagnosis Date   Allergic rhinitis    Anxiety    Arthritis    Cataracts, bilateral    Chronic back pain    Sees pain mam[facility] clinic   Chronic pain syndrome    Constipation    Depression    Essential hypertension    GERD (gastroesophageal reflux disease)    Headaches, cluster    History of seizures    Hypothyroid    Mania (HCC)    Mixed hyperlipidemia    PTSD (post-traumatic stress disorder)    Right lumbar radiculopathy    Sleep apnea    STOP BANG score=4   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[facility])    Past Surgical History:  Procedure Laterality Date   BACK SURGERY     BILATERAL OOPHORECTOMY     CATARACT EXTRACTION W/PHACO Left 01/04/2016   Procedure: CATARACT EXTRACTION PHACO AND INTRAOCULAR LENS PLACEMENT ([place]);  Surgeon: Rutherford Guys, MD;  Location: [facility];  Service: Ophthalmology;  Laterality: Left;  CDE:4.70   CATARACT EXTRACTION W/PHACO Right 01/18/2016   Procedure: CATARACT EXTRACTION PHACO AND INTRAOCULAR LENS PLACEMENT RIGHT EYE; CDE:  4.66;  Surgeon: Rutherford Guys, MD;  Location: [facility];  Service: Ophthalmology;  Laterality: Right;   CHOLECYSTECTOMY     COLONOSCOPY  10/16/2008   QAS:[id number], otherwise normal rectum; pancolonic diverticula the remainder of the colonic mucosa appeared normal. Next TCS due 10/2013.   COLONOSCOPY  06/28/2006    RMR: Internal hemorrhoids. Diminutive rectal polyps, cold biopsied/ Pancolonic diverticula. Polyps in the right colon removed    COLONOSCOPY WITH PROPOFOL N/A 05/20/2015   RMR: Pancolonic diverticulosis. Redundant colon    COLONOSCOPY WITH PROPOFOL N/A 11/11/2020   Procedure: COLONOSCOPY WITH PROPOFOL;  Surgeon: Daneil Dolin, MD;  [facility];  Service: Endoscopy;  Laterality: N/A;  pm ASA 2   ESOPHAGOGASTRODUODENOSCOPY  10/16/2008   GXQ:JJHER hiatal hernia otherwise normal esophagus, stomach  D1, D2, status post passage of a 56-French Maloney dilator   ESOPHAGOGASTRODUODENOSCOPY  06/28/2006   DEY:CXKGYJ esophagus. Small hiatal hernia. Otherwise normal stomach, D1 and D2, status post passage of a 56 French Maloney dilator   ESOPHAGOGASTRODUODENOSCOPY (EGD) WITH ESOPHAGEAL DILATION N/A 07/14/2013   RMR: Abnormal esophagus of uncertain significance-status post esophageal biospy. small hiatal hernia.    ESOPHAGOGASTRODUODENOSCOPY (EGD) WITH PROPOFOL N/A 05/20/2015   RMR: NORMAL EGD status post maloney dilation    ESOPHAGOGASTRODUODENOSCOPY (EGD) WITH PROPOFOL N/A 12/23/2018   Dr. Gala Romney: normal esophagus, s/p esophageal dilation.    FOOT ARTHRODESIS Right 03/31/2022   Procedure: RIGHT FOOT TARSAL-METATARSAL FUSION;  Surgeon: Newt Minion, MD;  Location: [place];  Service: Orthopedics;  Laterality: Right;   FOOT SURGERY  left foot-   GASTROCNEMIUS RECESSION Right 03/31/2022   Procedure: RIGHT ACHILLES LENGTHENING;  Surgeon: Newt Minion, MD;  Location: [place];  Service: Orthopedics;  Laterality: Right;   INCISION AND DRAINAGE Right 01/03/2013   Procedure: INCISION AND DRAINAGE;  Surgeon: Carole Civil, MD;  Location: [facility];  Service: Orthopedics;  Laterality: Right;   KNEE ARTHROSCOPY  01/2012   right   KNEE ARTHROSCOPY WITH LATERAL RELEASE Right 01/03/2013   Procedure: Lateral Release Patella Right Knee;  Surgeon: Carole Civil, MD;  Location: [facility];  Service: Orthopedics;   Laterality: Right;   LAPAROSCOPIC LYSIS OF ADHESIONS N/A 11/12/2019   Procedure: LAPAROSCOPIC LYSIS OF ADHESIONS, extensive; drainage of peritoneal cyst;  Surgeon: Florian Buff, MD;  Location: [facility];  Service: Gynecology;  Laterality: N/A;   LUMBAR DISC SURGERY  L4-5   MALONEY DILATION N/A 05/20/2015   Procedure: Venia Minks DILATION;  Surgeon: Daneil Dolin, MD;  Location: [facility];  Service: Endoscopy;  Laterality: N/AVenia Minks dilator # 26   MALONEY DILATION N/A 12/23/2018   Procedure: Venia Minks DILATION;  Surgeon: Daneil Dolin, MD;  [facility];  Service: Endoscopy;  Laterality: N/A;   PARTIAL HYSTERECTOMY     TOTAL KNEE ARTHROPLASTY  07/01/2012   Procedure: TOTAL KNEE ARTHROPLASTY;  Surgeon: Carole Civil, MD;  Location: [facility];  Service: Orthopedics;  Laterality: Right;   TOTAL KNEE REVISION Right 01/03/2013   Procedure: Patellaplasty Right Knee;  Surgeon: Carole Civil, MD;  Location: [facility];  Service: Orthopedics;  Laterality: Right;   Patient Active Problem List   Diagnosis Date Noted   Toxic metabolic encephalopathy 72/05/4708   Seizure ([place]) 08/10/2022   UTI (urinary tract infection) 08/10/2022   Primary osteoarthritis of right foot    Syncope 02/18/2020   Prolonged QT interval 02/18/2020   Glucose intolerance 02/18/2020   Normocytic anemia 12/10/2019   Poor appetite 09/24/2019   Abnormal weight loss 09/24/2019   Upper abdominal mass 09/24/2019   Dysphagia 01/30/2019   Simple adnexal cyst greater than 1 cm in diameter in postmenopausal patient 11/08/2018   Abdominal pain 08/09/2015   Dysphagia, pharyngoesophageal phase    H/O adenomatous polyp of colon 05/05/2015   Constipation 02/02/2015   Knee contusion 12/09/2013   Esophageal dysphagia 07/02/2013   Abdominal pain, epigastric 07/02/2013   Chronic pain syndrome 11/28/2012   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   Cellulitis 11/04/2012   Postoperative wound breakdown 11/04/2012   S/P total knee  replacement 07/01/12 08/13/2012   Knee pain 08/13/2012   Hypokalemia 08/01/2012   Cellulitis, wound, post-operative 07/30/2012   Degenerative tear of medial meniscus of right knee 01/12/2012   OA (osteoarthritis) of knee 01/12/2012   Right knee sprain 11/02/2011   Instability of knee joint 03/29/2011   URTICARIA 12/21/2009   OTHER URINARY INCONTINENCE 07/18/2009   FOOT PAIN, LEFT 05/26/2009   DERMATITIS, CHRONIC 10/21/2008   IMPAIRED GLUCOSE TOLERANCE 09/09/2008   Chronic back pain 09/09/2008   Hypothyroidism 02/27/2008   Hyperlipidemia 02/27/2008   Anxiety with depression 02/27/2008   Bipolar I disorder, most recent episode depressed ([place]) 02/27/2008   Essential hypertension 02/27/2008   ALLERGIC RHINITIS 02/27/2008   GERD 02/27/2008   HEADACHE 02/27/2008    PCP: Redmond School MD  REFERRING PROVIDER: Newt Minion, MD  Next apt: 08/26/22  REFERRING DIAG: G28.366Q (ICD-10-CM) - Sprain of tarsometatarsal ligament of right foot, subsequent encounter   THERAPY DIAG:  Pain in right ankle and joints  of right foot  Difficulty in walking, not elsewhere classified  Rationale for Evaluation and Treatment Rehabilitation  ONSET DATE: 03/31/22  SUBJECTIVE:   SUBJECTIVE STATEMENT: Pt stated she is feeling good today, no reports of pain.  Reports most difficulty getting great toe to move.  Reports increased compliance at home.  Eval: Patient presents with RT foot and ankle pain s/p RT metatarsal fusion on 03/31/20. She was having trouble for several months and they thought it was a sprain. She wore boot for 6 weeks but did not get any better. She was sent for surgery. She is still having trouble walking, still having pain. She says her sx are no better since surgery. She has not had formal therapy for this. She is still taking pain medication and she is walking with a SPC.   PERTINENT HISTORY: Arthritis, HTN, anxiety  PAIN:  Are you having pain? Yes: NPRS scale: 7/10 Pain  location: RT dorsal foot down to great toe  Pain description: throbbing, stinging Aggravating factors: standing, walking Relieving factors: meds, soaking in epsom salt   PRECAUTIONS: None  WEIGHT BEARING RESTRICTIONS No  FALLS:  Has patient fallen in last 6 months? Yes. Number of falls 2  LIVING ENVIRONMENT: Lives with: lives with their partner Lives in: House/apartment Stairs: No Has following equipment at home: Single point cane and Environmental consultant - 2 wheeled  OCCUPATION: Retired   PLOF: Independent with basic ADLs  PATIENT GOALS Decrease pain and swelling    OBJECTIVE:   DIAGNOSTIC FINDINGS: NA  PATIENT SURVEYS:  FOTO 38% (was 45% function)  COGNITION:  Overall cognitive status: Within functional limits for tasks assessed     SENSATION: Reports numbness/ tingling in RT foot   EDEMA:  Min/ mod edema in RT foot   PALPATION: Hyper sensitive about incision and top of RT foot  LOWER EXTREMITY ROM:  Active ROM Right eval Left eval Right 08/22/22 Left 08/22/22  Hip flexion      Hip extension      Hip abduction      Hip adduction      Hip internal rotation      Hip external rotation      Knee flexion      Knee extension      Ankle dorsiflexion -6 4 -20 (has 5 deg PROM) 3  Ankle plantarflexion _0 48  Ankle inversion  18    Ankle eversion  12     (Blank rows = not tested)  LOWER EXTREMITY MMT:  MMT Right eval Left eval Right 07/19/22 Right 08/22/22 Left 08/22/22  Hip flexion       Hip extension       Hip abduction       Hip adduction       Hip internal rotation       Hip external rotation       Knee flexion       Knee extension       Ankle dorsiflexion 3-  2+ * 3+ 4  Ankle plantarflexion       Ankle inversion 3-  3-* 3+ 4  Ankle eversion 3-  3-* 3- 4   (Blank rows = not tested)  *=pain     FUNCTIONAL TESTS:  2 minute walk test: 180 feet  GAIT: Distance walked: 180 feet  Assistive device utilized: Single point cane Level of  assistance: Modified independence Comments: decreased stride, antalgic, decreased stance on RT    TODAY'S TREATMENT: 09/12/22 Standing:  heelraises 10X (  minimal Rt heel rise)  Toeraises 10X (minimal toe rise)  SLS 1-2" max of 5 BLE  Rockerboard A/P and Rt/Lt 1' each bil UE support  Tandem stance 1x 30"  08/29/22 Standing:  heelraises 10X (minimal Rt heel rise)  Toeraises 10X (minimal toe rise)  Rockerboard A/P and Rt/Lt 10X each with bil UE support  4 inch step up forward Rt 10X with 1 UE assist  4 inch lateral step up Rt 10X with 1 UE assist  Slant board stretch 4X30"  Tandem stance Rt and LT lead 1 minute (max 5 sec either LE without UE assist)  08/22/22 Reassess FOTO AROM MMT 2 MWT  Seated calf stretch 2x 30" Ankle band 4 way GTB x 10 each  08/10/22 STW and man ROM to right foot and ankle and big toe x 10'; no other treatment performed during manual treatment.   [place] pick up x 5' Heel slides x 10 on box Dorsiflexion stretch with towel 5 x 10" 4 way RTB 10 x each Ball roll for DF/PF x 2'   08/08/22: Isometric 10x5" pushing into ball BAPS L3 DF/PF; Inv/Ev; CW/CCW RTB 4 way 10x 5" each Towel gastroc stretch 2x 30"    PATIENT EDUCATION:  Education details: on eval findings, POC and HEP  Person educated: Patient Education method: Explanation Education comprehension: verbalized understanding   HOME EXERCISE PROGRAM: Access Code: [email or web address] URL: [email or web address] 07/04/22 - Seated Ankle Plantar Flexion with Resistance Loop  - 2 x daily - 7 x weekly - 2 sets - 10 reps - Seated Ankle Inversion with Resistance  - 2 x daily - 7 x weekly - 2 sets - 10 reps - Seated Ankle Eversion with Resistance  - 2 x daily - 7 x weekly - 2 sets - 10 reps - Seated Ankle Dorsiflexion with Resistance  - 2 x daily - 7 x weekly - 2 sets - 10 reps  Date: 06/20/2022 Prepared by: Josue Hector  Exercises - Towel Scrunches  - 3 x daily - 7 x weekly - 3 sets - 10  reps - Ankle Inversion Eversion Towel Slide  - 3 x daily - 7 x weekly - 3 sets - 10 reps - Seated Ankle Circles  - 3 x daily - 7 x weekly - 3 sets - 10 reps - Seated Calf Stretch with Strap  - 3 x daily - 7 x weekly - 1 sets - 4 reps - 30 second hold  09/12/22:  tandem stance  ASSESSMENT:  CLINICAL IMPRESSION: Pt late for apt then required restroom break so limited time for treatment today.  Session focus with ankle strengthening and balance training.  Pt required max cueing for posturing and form with all exercises.  No reports of pain.  Pt limited high with balance activities today, educated importance of working on balance and strengthening daily for maximal benefits.  Reviewed importance of HEP compliance as pt reports she has been doing 2x a day though unable to recall what exercises she is doing at home.    OBJECTIVE IMPAIRMENTS Abnormal gait, decreased activity tolerance, decreased balance, decreased endurance, decreased mobility, difficulty walking, decreased ROM, decreased strength, hypomobility, increased edema, increased fascial restrictions, impaired flexibility, impaired sensation, improper body mechanics, and pain.   ACTIVITY LIMITATIONS standing, squatting, stairs, transfers, and locomotion level  PARTICIPATION LIMITATIONS: meal prep, cleaning, laundry, driving, shopping, community activity, and yard work  PERSONAL FACTORS Past/current experiences and Time since onset of injury/illness/exacerbation are also affecting patient's functional outcome.   REHAB  POTENTIAL: Good  CLINICAL DECISION MAKING: Stable/uncomplicated  EVALUATION COMPLEXITY: Low   GOALS: SHORT TERM GOALS: Target date: 07/18/2022  Patient will be independent with initial HEP and self-management strategies to improve functional outcomes Baseline: 07/19/22:  Reports compliance with HEP daily Goal status: MET   2. Patient will have RT ankle DF AROM at least 5 degrees to improve functional mobility, gait  mechanics and stair ambulation.  Baseline:-20 deg Goal status: IN PROGRESS   LONG TERM GOALS: Target date: 08/15/2022  Patient will be independent with advanced HEP and self-management strategies to improve functional outcomes Baseline: Goal status: IN PROGRESS  2.  Patient will improve FOTO score to predicted value to indicate improvement in functional outcomes Baseline: 38% function (decreased)  Goal status: IN PROGRESS  3.  Patient will have RT ankle DF AROM at least 10 degrees to improve functional mobility, gait mechanics and stair ambulation.  Baseline: -20 Goal status: IN PROGRESS  4. Patient will have equal to or > 4/5 MMT throughout RT ankle to improve ability to perform functional mobility, stair ambulation and ADLs.  Baseline: See MMT Goal status: IN PROGRESS  5. Patient will be able to ambulate at least 300 feet during 2MWT with LRAD to demonstrate improved ability to perform functional mobility and associated tasks. Baseline: 180 feet with SPC Goal status: IN PROGRESS   PLAN: PT FREQUENCY: 2x/week  PT DURATION: 6 weeks  PLANNED INTERVENTIONS: Therapeutic exercises, Therapeutic activity, Neuromuscular re-education, Balance training, Gait training, Patient/Family education, Joint manipulation, Joint mobilization, Stair training, Aquatic Therapy, Dry Needling, Electrical stimulation, Spinal manipulation, Spinal mobilization, Cryotherapy, Moist heat, scar mobilization, Taping, Traction, Ultrasound, Biofeedback, Ionotophoresis 62m/ml Dexamethasone, and Manual therapy.   PLAN FOR NEXT SESSION:  Progress ankle strength and mobility as tolerated. Push standing and more functional strengthening.   CIhor Austin LPTA/CLT; [facility] [phone] 4:30 PM, 09/12/22

## 2022-09-12 NOTE — Telephone Encounter (Signed)
No show, called with no answer and mail box has not been set up. Unable to leave message concerning missed apt today. Pt signed in as therapist writing this note, 18 minutes late for apt.   Ihor Austin, LPTA/CLT; Delana Meyer [phone]

## 2022-09-14 ENCOUNTER — Encounter (HOSPITAL_COMMUNITY): Payer: Self-pay

## 2022-09-14 ENCOUNTER — Other Ambulatory Visit: Payer: Self-pay | Admitting: Orthopedic Surgery

## 2022-09-14 ENCOUNTER — Ambulatory Visit (HOSPITAL_COMMUNITY): Payer: 59

## 2022-09-14 ENCOUNTER — Encounter (HOSPITAL_COMMUNITY): Payer: Medicare Other | Admitting: Physical Therapy

## 2022-09-14 DIAGNOSIS — R262 Difficulty in walking, not elsewhere classified: Secondary | ICD-10-CM | POA: Diagnosis not present

## 2022-09-14 DIAGNOSIS — M25571 Pain in right ankle and joints of right foot: Secondary | ICD-10-CM | POA: Diagnosis not present

## 2022-09-14 DIAGNOSIS — G894 Chronic pain syndrome: Secondary | ICD-10-CM

## 2022-09-14 NOTE — Therapy (Signed)
OUTPATIENT PHYSICAL THERAPY LOWER EXTREMITY TREATMENT   Patient Name: Diane [name] MRN: [id number] DOB:[birth year], 72 y.o., female Today's Date: 09/14/2022      PT End of Session - 09/14/22 1524     Visit Number 13    Number of Visits 22    Date for PT Re-Evaluation 10/03/22    Authorization Type [employer] Medicare    Progress Note Due on Visit 35    PT Start Time 1524   late sign in   PT Stop Time 1602    PT Time Calculation (min) 38 min    Activity Tolerance Patient tolerated treatment well    Behavior During Therapy WFL for tasks assessed/performed                  Past Medical History:  Diagnosis Date   Allergic rhinitis    Anxiety    Arthritis    Cataracts, bilateral    Chronic back pain    Sees pain mamagement clinic   Chronic pain syndrome    Constipation    Depression    Essential hypertension    GERD (gastroesophageal reflux disease)    Headaches, cluster    History of seizures    Hypothyroid    Mania (HCC)    Mixed hyperlipidemia    PTSD (post-traumatic stress disorder)    Right lumbar radiculopathy    Sleep apnea    STOP BANG score=4   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[facility])    Past Surgical History:  Procedure Laterality Date   BACK SURGERY     BILATERAL OOPHORECTOMY     CATARACT EXTRACTION W/PHACO Left 01/04/2016   Procedure: CATARACT EXTRACTION PHACO AND INTRAOCULAR LENS PLACEMENT ([place]);  Surgeon: Rutherford Guys, MD;  Location: [facility];  Service: Ophthalmology;  Laterality: Left;  CDE:4.70   CATARACT EXTRACTION W/PHACO Right 01/18/2016   Procedure: CATARACT EXTRACTION PHACO AND INTRAOCULAR LENS PLACEMENT RIGHT EYE; CDE:  4.66;  Surgeon: Rutherford Guys, MD;  Location: [facility];  Service: Ophthalmology;  Laterality: Right;   CHOLECYSTECTOMY     COLONOSCOPY  10/16/2008   ONG:[id number], otherwise normal rectum; pancolonic diverticula the remainder of the colonic mucosa appeared normal. Next TCS due 10/2013.   COLONOSCOPY  06/28/2006   RMR: Internal  hemorrhoids. Diminutive rectal polyps, cold biopsied/ Pancolonic diverticula. Polyps in the right colon removed    COLONOSCOPY WITH PROPOFOL N/A 05/20/2015   RMR: Pancolonic diverticulosis. Redundant colon    COLONOSCOPY WITH PROPOFOL N/A 11/11/2020   Procedure: COLONOSCOPY WITH PROPOFOL;  Surgeon: Daneil Dolin, MD;  [facility];  Service: Endoscopy;  Laterality: N/A;  pm ASA 2   ESOPHAGOGASTRODUODENOSCOPY  10/16/2008   WNU:UVOZD hiatal hernia otherwise normal esophagus, stomach  D1, D2, status post passage of a 56-French Maloney dilator   ESOPHAGOGASTRODUODENOSCOPY  06/28/2006   GUY:QIHKVQ esophagus. Small hiatal hernia. Otherwise normal stomach, D1 and D2, status post passage of a 56 French Maloney dilator   ESOPHAGOGASTRODUODENOSCOPY (EGD) WITH ESOPHAGEAL DILATION N/A 07/14/2013   RMR: Abnormal esophagus of uncertain significance-status post esophageal biospy. small hiatal hernia.    ESOPHAGOGASTRODUODENOSCOPY (EGD) WITH PROPOFOL N/A 05/20/2015   RMR: NORMAL EGD status post maloney dilation    ESOPHAGOGASTRODUODENOSCOPY (EGD) WITH PROPOFOL N/A 12/23/2018   Dr. Gala Romney: normal esophagus, s/p esophageal dilation.    FOOT ARTHRODESIS Right 03/31/2022   Procedure: RIGHT FOOT TARSAL-METATARSAL FUSION;  Surgeon: Newt Minion, MD;  Location: [place];  Service: Orthopedics;  Laterality: Right;   FOOT SURGERY     left  foot-   GASTROCNEMIUS RECESSION Right 03/31/2022   Procedure: RIGHT ACHILLES LENGTHENING;  Surgeon: Newt Minion, MD;  Location: [place];  Service: Orthopedics;  Laterality: Right;   INCISION AND DRAINAGE Right 01/03/2013   Procedure: INCISION AND DRAINAGE;  Surgeon: Carole Civil, MD;  Location: [facility];  Service: Orthopedics;  Laterality: Right;   KNEE ARTHROSCOPY  01/2012   right   KNEE ARTHROSCOPY WITH LATERAL RELEASE Right 01/03/2013   Procedure: Lateral Release Patella Right Knee;  Surgeon: Carole Civil, MD;  Location: [facility];  Service: Orthopedics;  Laterality:  Right;   LAPAROSCOPIC LYSIS OF ADHESIONS N/A 11/12/2019   Procedure: LAPAROSCOPIC LYSIS OF ADHESIONS, extensive; drainage of peritoneal cyst;  Surgeon: Florian Buff, MD;  Location: [facility];  Service: Gynecology;  Laterality: N/A;   LUMBAR DISC SURGERY  L4-5   MALONEY DILATION N/A 05/20/2015   Procedure: Venia Minks DILATION;  Surgeon: Daneil Dolin, MD;  Location: [facility];  Service: Endoscopy;  Laterality: N/AVenia Minks dilator # 45   MALONEY DILATION N/A 12/23/2018   Procedure: Venia Minks DILATION;  Surgeon: Daneil Dolin, MD;  [facility];  Service: Endoscopy;  Laterality: N/A;   PARTIAL HYSTERECTOMY     TOTAL KNEE ARTHROPLASTY  07/01/2012   Procedure: TOTAL KNEE ARTHROPLASTY;  Surgeon: Carole Civil, MD;  Location: [facility];  Service: Orthopedics;  Laterality: Right;   TOTAL KNEE REVISION Right 01/03/2013   Procedure: Patellaplasty Right Knee;  Surgeon: Carole Civil, MD;  Location: [facility];  Service: Orthopedics;  Laterality: Right;   Patient Active Problem List   Diagnosis Date Noted   Toxic metabolic encephalopathy 71/69/6789   Seizure ([place]) 08/10/2022   UTI (urinary tract infection) 08/10/2022   Primary osteoarthritis of right foot    Syncope 02/18/2020   Prolonged QT interval 02/18/2020   Glucose intolerance 02/18/2020   Normocytic anemia 12/10/2019   Poor appetite 09/24/2019   Abnormal weight loss 09/24/2019   Upper abdominal mass 09/24/2019   Dysphagia 01/30/2019   Simple adnexal cyst greater than 1 cm in diameter in postmenopausal patient 11/08/2018   Abdominal pain 08/09/2015   Dysphagia, pharyngoesophageal phase    H/O adenomatous polyp of colon 05/05/2015   Constipation 02/02/2015   Knee contusion 12/09/2013   Esophageal dysphagia 07/02/2013   Abdominal pain, epigastric 07/02/2013   Chronic pain syndrome 11/28/2012   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   Cellulitis 11/04/2012   Postoperative wound breakdown 11/04/2012   S/P total knee replacement  07/01/12 08/13/2012   Knee pain 08/13/2012   Hypokalemia 08/01/2012   Cellulitis, wound, post-operative 07/30/2012   Degenerative tear of medial meniscus of right knee 01/12/2012   OA (osteoarthritis) of knee 01/12/2012   Right knee sprain 11/02/2011   Instability of knee joint 03/29/2011   URTICARIA 12/21/2009   OTHER URINARY INCONTINENCE 07/18/2009   FOOT PAIN, LEFT 05/26/2009   DERMATITIS, CHRONIC 10/21/2008   IMPAIRED GLUCOSE TOLERANCE 09/09/2008   Chronic back pain 09/09/2008   Hypothyroidism 02/27/2008   Hyperlipidemia 02/27/2008   Anxiety with depression 02/27/2008   Bipolar I disorder, most recent episode depressed ([place]) 02/27/2008   Essential hypertension 02/27/2008   ALLERGIC RHINITIS 02/27/2008   GERD 02/27/2008   HEADACHE 02/27/2008    PCP: Redmond School MD  REFERRING PROVIDER: Newt Minion, MD  Next apt: 08/26/22  REFERRING DIAG: F81.017P (ICD-10-CM) - Sprain of tarsometatarsal ligament of right foot, subsequent encounter   THERAPY DIAG:  Pain in right ankle and joints of  right foot  Difficulty in walking, not elsewhere classified  Rationale for Evaluation and Treatment Rehabilitation  ONSET DATE: 03/31/22  SUBJECTIVE:   SUBJECTIVE STATEMENT: Pt stated she is feeling good today, main difficulty with balance currently.  Has been working on it at home.  Eval: Patient presents with RT foot and ankle pain s/p RT metatarsal fusion on 03/31/20. She was having trouble for several months and they thought it was a sprain. She wore boot for 6 weeks but did not get any better. She was sent for surgery. She is still having trouble walking, still having pain. She says her sx are no better since surgery. She has not had formal therapy for this. She is still taking pain medication and she is walking with a SPC.   PERTINENT HISTORY: Arthritis, HTN, anxiety  PAIN:  Are you having pain? Yes: NPRS scale: 5/10 Pain location: RT dorsal foot down to great toe  Pain  description: throbbing, stinging Aggravating factors: standing, walking Relieving factors: meds, soaking in epsom salt   PRECAUTIONS: None  WEIGHT BEARING RESTRICTIONS No  FALLS:  Has patient fallen in last 6 months? Yes. Number of falls 2  LIVING ENVIRONMENT: Lives with: lives with their partner Lives in: House/apartment Stairs: No Has following equipment at home: Single point cane and Environmental consultant - 2 wheeled  OCCUPATION: Retired   PLOF: Independent with basic ADLs  PATIENT GOALS Decrease pain and swelling    OBJECTIVE:   DIAGNOSTIC FINDINGS: NA  PATIENT SURVEYS:  FOTO 38% (was 45% function)  COGNITION:  Overall cognitive status: Within functional limits for tasks assessed     SENSATION: Reports numbness/ tingling in RT foot   EDEMA:  Min/ mod edema in RT foot   PALPATION: Hyper sensitive about incision and top of RT foot  LOWER EXTREMITY ROM:  Active ROM Right eval Left eval Right 08/22/22 Left 08/22/22  Hip flexion      Hip extension      Hip abduction      Hip adduction      Hip internal rotation      Hip external rotation      Knee flexion      Knee extension      Ankle dorsiflexion -6 4 -20 (has 5 deg PROM) 3  Ankle plantarflexion _0 48  Ankle inversion  18    Ankle eversion  12     (Blank rows = not tested)  LOWER EXTREMITY MMT:  MMT Right eval Left eval Right 07/19/22 Right 08/22/22 Left 08/22/22  Hip flexion       Hip extension       Hip abduction       Hip adduction       Hip internal rotation       Hip external rotation       Knee flexion       Knee extension       Ankle dorsiflexion 3-  2+ * 3+ 4  Ankle plantarflexion       Ankle inversion 3-  3-* 3+ 4  Ankle eversion 3-  3-* 3- 4   (Blank rows = not tested)  *=pain     FUNCTIONAL TESTS:  2 minute walk test: 180 feet  GAIT: Distance walked: 180 feet  Assistive device utilized: Single point cane Level of assistance: Modified independence Comments: decreased  stride, antalgic, decreased stance on RT    TODAY'S TREATMENT: 09/14/22: Standing:  heelraises 10X (minimal Rt heel rise)  Toeraises 10X (  minimal toe rise) decline slope to improve mechanics  Tandem stance 3x 30"  SLS Lt 7", Rt 4" max of 3  Vector stance 3x 5" BLE UE support  4 inch step up forward Rt 10X with 1 UE assist  4in step down Rt 10x with 1 UE A  Slant board 3x 30"  09/12/22 Standing:  heelraises 10X (minimal Rt heel rise)  Toeraises 10X (minimal toe rise)  SLS 1-2" max of 5 BLE  Rockerboard A/P and Rt/Lt 1' each bil UE support  Tandem stance 1x 30"  08/29/22 Standing:  heelraises 10X (minimal Rt heel rise)  Toeraises 10X (minimal toe rise)  Rockerboard A/P and Rt/Lt 10X each with bil UE support  4 inch step up forward Rt 10X with 1 UE assist  4 inch lateral step up Rt 10X with 1 UE assist  Slant board stretch 4X30"  Tandem stance Rt and LT lead 1 minute (max 5 sec either LE without UE assist)  08/22/22 Reassess FOTO AROM MMT 2 MWT  Seated calf stretch 2x 30" Ankle band 4 way GTB x 10 each  08/10/22 STW and man ROM to right foot and ankle and big toe x 10'; no other treatment performed during manual treatment.   [place] pick up x 5' Heel slides x 10 on box Dorsiflexion stretch with towel 5 x 10" 4 way RTB 10 x each Ball roll for DF/PF x 2'   08/08/22: Isometric 10x5" pushing into ball BAPS L3 DF/PF; Inv/Ev; CW/CCW RTB 4 way 10x 5" each Towel gastroc stretch 2x 30"    PATIENT EDUCATION:  Education details: on eval findings, POC and HEP  Person educated: Patient Education method: Explanation Education comprehension: verbalized understanding   HOME EXERCISE PROGRAM: Access Code: [email or web address] URL: [email or web address] 07/04/22 - Seated Ankle Plantar Flexion with Resistance Loop  - 2 x daily - 7 x weekly - 2 sets - 10 reps - Seated Ankle Inversion with Resistance  - 2 x daily - 7 x weekly - 2 sets - 10 reps - Seated Ankle Eversion  with Resistance  - 2 x daily - 7 x weekly - 2 sets - 10 reps - Seated Ankle Dorsiflexion with Resistance  - 2 x daily - 7 x weekly - 2 sets - 10 reps  Date: 06/20/2022 Prepared by: Josue Hector  Exercises - Towel Scrunches  - 3 x daily - 7 x weekly - 3 sets - 10 reps - Ankle Inversion Eversion Towel Slide  - 3 x daily - 7 x weekly - 3 sets - 10 reps - Seated Ankle Circles  - 3 x daily - 7 x weekly - 3 sets - 10 reps - Seated Calf Stretch with Strap  - 3 x daily - 7 x weekly - 1 sets - 4 reps - 30 second hold  09/12/22:  tandem stance  ASSESSMENT:  CLINICAL IMPRESSION: Continued session focus with ankle strengthening and balance training.  Pt progressing slowly towards goals, reviewed importance of HEP compliance with reports of practicing this at home.  Added vector stance for hip strengthening to assist with balance training, required HHA.  Pt required cueing through session for form and mechanics.  Limited by fatigue at EOS.    OBJECTIVE IMPAIRMENTS Abnormal gait, decreased activity tolerance, decreased balance, decreased endurance, decreased mobility, difficulty walking, decreased ROM, decreased strength, hypomobility, increased edema, increased fascial restrictions, impaired flexibility, impaired sensation, improper body mechanics, and pain.   ACTIVITY LIMITATIONS standing, squatting, stairs, transfers, and locomotion level  PARTICIPATION LIMITATIONS: meal prep, cleaning, laundry, driving, shopping, community activity, and yard work  PERSONAL FACTORS Past/current experiences and Time since onset of injury/illness/exacerbation are also affecting patient's functional outcome.   REHAB POTENTIAL: Good  CLINICAL DECISION MAKING: Stable/uncomplicated  EVALUATION COMPLEXITY: Low   GOALS: SHORT TERM GOALS: Target date: 07/18/2022  Patient will be independent with initial HEP and self-management strategies to improve functional outcomes Baseline: 07/19/22:  Reports compliance with  HEP daily Goal status: MET   2. Patient will have RT ankle DF AROM at least 5 degrees to improve functional mobility, gait mechanics and stair ambulation.  Baseline:-20 deg Goal status: IN PROGRESS   LONG TERM GOALS: Target date: 08/15/2022  Patient will be independent with advanced HEP and self-management strategies to improve functional outcomes Baseline: Goal status: IN PROGRESS  2.  Patient will improve FOTO score to predicted value to indicate improvement in functional outcomes Baseline: 38% function (decreased)  Goal status: IN PROGRESS  3.  Patient will have RT ankle DF AROM at least 10 degrees to improve functional mobility, gait mechanics and stair ambulation.  Baseline: -20 Goal status: IN PROGRESS  4. Patient will have equal to or > 4/5 MMT throughout RT ankle to improve ability to perform functional mobility, stair ambulation and ADLs.  Baseline: See MMT Goal status: IN PROGRESS  5. Patient will be able to ambulate at least 300 feet during 2MWT with LRAD to demonstrate improved ability to perform functional mobility and associated tasks. Baseline: 180 feet with SPC Goal status: IN PROGRESS   PLAN: PT FREQUENCY: 2x/week  PT DURATION: 6 weeks  PLANNED INTERVENTIONS: Therapeutic exercises, Therapeutic activity, Neuromuscular re-education, Balance training, Gait training, Patient/Family education, Joint manipulation, Joint mobilization, Stair training, Aquatic Therapy, Dry Needling, Electrical stimulation, Spinal manipulation, Spinal mobilization, Cryotherapy, Moist heat, scar mobilization, Taping, Traction, Ultrasound, Biofeedback, Ionotophoresis 49m/ml Dexamethasone, and Manual therapy.   PLAN FOR NEXT SESSION:  Progress ankle strength and mobility as tolerated. Push standing and more functional strengthening.   CIhor Austin LPTA/CLT; CDelana [phone] 4:23 PM, 09/14/22

## 2022-09-15 ENCOUNTER — Ambulatory Visit: Payer: Self-pay | Admitting: *Deleted

## 2022-09-15 ENCOUNTER — Encounter: Payer: Self-pay | Admitting: *Deleted

## 2022-09-18 NOTE — Patient Outreach (Signed)
  Care Coordination   Follow Up Visit Note   09/15/2022 Name: Diane [name] MRN: [id number] DOB: 14-May-1951  Diane [name] is a 72 y.o. year old female who sees Redmond School, MD for primary care. I spoke with  Diane [name] by phone today.  What matters to the patients health and wellness today?  Counseling services and    Goals Addressed               This Visit's Progress     Patient Stated     COMPLETED: Recover from foot surgery (pt-stated)        Care Coordination Interventions: Reviewed provider established plan for pain management Discussed importance of adherence to all scheduled medical appointments Counseled on the importance of reporting any/all new or changed pain symptoms or management strategies to pain management provider Advised patient to discuss recommendations for ongoing surgical recovery with provider Appointment with Dr.Duda next week Assessed for additional needs related to this intervention. None at this time.         Other     COMPLETED: Care Coordination Services        Care Coordination Interventions: Social Work referral for counseling services until she is able to get back in with [employer], LCSW. Recent visit with Dr Harrington Challenger for med management of bipolar disorder.  Discussed plans with patient for ongoing care management follow up and provided patient with direct contact information for care management team Assessed social determinant of health barriers Encouraged to reach out to Care Coordination team as needed        SDOH assessments and interventions completed:  Yes  SDOH Interventions Today    Flowsheet Row Most Recent Value  SDOH Interventions   Food Insecurity Interventions Intervention Not Indicated  Transportation Interventions Intervention Not Indicated  Financial Strain Interventions Intervention Not Indicated  Physical Activity Interventions Other (Comments)  [physical therapy twice a week]        Care Coordination  Interventions:  Yes, provided   Follow up plan: Follow up call scheduled for 11/17/22    Encounter Outcome:  Pt. Visit Completed   Chong Sicilian, BSN, RN-BC RN Care Coordinator [place]: [phone] Main #: [phone]

## 2022-09-19 ENCOUNTER — Telehealth (HOSPITAL_COMMUNITY): Payer: Self-pay | Admitting: Physical Therapy

## 2022-09-19 ENCOUNTER — Encounter (HOSPITAL_COMMUNITY): Payer: Medicare Other | Admitting: Physical Therapy

## 2022-09-19 NOTE — Telephone Encounter (Signed)
Called patient about missed visit today. Patient says she is not feeling well and will not make it today. Gave reminder of upcoming appointments.   11:40 AM, 09/19/22 Josue Hector PT DPT  Physical Therapist with [facility]  [phone]

## 2022-09-21 ENCOUNTER — Ambulatory Visit (INDEPENDENT_AMBULATORY_CARE_PROVIDER_SITE_OTHER): Payer: 59 | Admitting: Orthopedic Surgery

## 2022-09-21 ENCOUNTER — Other Ambulatory Visit: Payer: Self-pay | Admitting: Orthopedic Surgery

## 2022-09-21 ENCOUNTER — Encounter (HOSPITAL_COMMUNITY): Payer: Medicare Other | Admitting: Physical Therapy

## 2022-09-21 ENCOUNTER — Encounter: Payer: Self-pay | Admitting: Orthopedic Surgery

## 2022-09-21 DIAGNOSIS — S92324G Nondisplaced fracture of second metatarsal bone, right foot, subsequent encounter for fracture with delayed healing: Secondary | ICD-10-CM | POA: Diagnosis not present

## 2022-09-21 DIAGNOSIS — M6701 Short Achilles tendon (acquired), right ankle: Secondary | ICD-10-CM | POA: Diagnosis not present

## 2022-09-21 DIAGNOSIS — G894 Chronic pain syndrome: Secondary | ICD-10-CM

## 2022-09-21 NOTE — Progress Notes (Signed)
Office Visit Note   Patient: Diane [name]           Date of Birth: [birth year]           MRN: [id number] Visit Date: 09/21/2022              Requested by: Redmond School, MD [address] [place],  [place] [address] PCP: Redmond School, MD  Chief Complaint  Patient presents with   Right Foot - Follow-up    03/31/2022 right foot tarsal MT fusion and achilles lengthening      HPI: Patient is a 72 year old woman who is seen in follow-up 5 and half months out from fusion base of the first and second metatarsals secondary to Lisfranc injury.  Patient states she is doing therapy at [facility] that is going fair.  Patient states that her most pain is at the great toe MTP joint at this time.  Assessment & Plan: Visit Diagnoses:  1. Short Achilles tendon (acquired), right ankle   2. Closed nondisplaced fracture of second metatarsal bone of right foot with delayed healing, subsequent encounter     Plan: Recommended stiff soled sneakers I have discussed using a carbon plate in the past.  She will continue with her Voltaren gel.  Follow-Up Instructions: No follow-ups on file.   Ortho Exam  Patient is alert, oriented, no adenopathy, well-dressed, normal affect, normal respiratory effort.  Patient is in soft flexible shoes no carbon plate.  The incision is well-healed she has dorsiflexion of the ankle to neutral she has pain with dorsiflexion of the great toe MTP joint with dorsiflexion of only 20 degrees.     Imaging: No results found. No images are attached to the encounter.  Labs: Lab Results  Component Value Date   HGBA1C 5.5 02/19/2020   HGBA1C 5.8 (H) 01/25/2012   HGBA1C 5.7 07/13/2009   ESRSEDRATE 2 03/26/2018   ESRSEDRATE 4 03/16/2014   ESRSEDRATE 40 (H) 12/30/2012   CRP 0.8 03/26/2018   CRP <0.5 03/16/2014   CRP 1.6 (H) 12/30/2012   REPTSTATUS 08/13/2022 FINAL 08/10/2022   GRAMSTAIN  01/03/2013    RARE WBC PRESENT, PREDOMINANTLY MONONUCLEAR NO SQUAMOUS  EPITHELIAL CELLS SEEN NO ORGANISMS SEEN   GRAMSTAIN  01/03/2013    RARE WBC PRESENT, PREDOMINANTLY PMN NO SQUAMOUS EPITHELIAL CELLS SEEN NO ORGANISMS SEEN   GRAMSTAIN  01/03/2013    RARE WBC PRESENT,BOTH PMN AND MONONUCLEAR NO SQUAMOUS EPITHELIAL CELLS SEEN NO ORGANISMS SEEN   GRAMSTAIN  01/03/2013    RARE WBC PRESENT, PREDOMINANTLY PMN NO SQUAMOUS EPITHELIAL CELLS SEEN NO ORGANISMS SEEN   GRAMSTAIN  01/03/2013    RARE WBC PRESENT,BOTH PMN AND MONONUCLEAR NO SQUAMOUS EPITHELIAL CELLS SEEN NO ORGANISMS SEEN   GRAMSTAIN  01/03/2013    RARE WBC PRESENT, PREDOMINANTLY MONONUCLEAR NO SQUAMOUS EPITHELIAL CELLS SEEN NO ORGANISMS SEEN   CULT (A) 08/10/2022    >=100,000 COLONIES/mL ESCHERICHIA COLI 50,000 COLONIES/mL KLEBSIELLA PNEUMONIAE    LABORGA ESCHERICHIA COLI (A) 08/10/2022   LABORGA KLEBSIELLA PNEUMONIAE (A) 08/10/2022     Lab Results  Component Value Date   ALBUMIN 3.2 (L) 08/10/2022   ALBUMIN 3.5 02/18/2020   ALBUMIN 3.4 (L) 11/10/2019    Lab Results  Component Value Date   MG 1.9 08/12/2022   MG 1.9 08/10/2022   MG 1.7 02/18/2020   No results found for: "VD25OH"  No results found for: "PREALBUMIN"    Latest Ref Rng & Units 08/12/2022    4:08 AM 08/10/2022  1:18 PM 08/10/2022    1:09 PM  CBC EXTENDED  WBC 4.0 - 10.5 K/uL 4.5   4.7   RBC 3.87 - 5.11 MIL/uL 3.90   3.71   Hemoglobin 12.0 - 15.0 g/dL 63.0   16.0   HCT 10.9 - 46.0 % 35.6   33.9   Platelets 150 - 400 K/uL 200   211   NEUT# 1.7 - 7.7 K/uL  1.8    Lymph# 0.7 - 4.0 K/uL  2.2       There is no height or weight on file to calculate BMI.  Orders:  No orders of the defined types were placed in this encounter.  No orders of the defined types were placed in this encounter.    Procedures: No procedures performed  Clinical Data: No additional findings.  ROS:  All other systems negative, except as noted in the HPI. Review of Systems  Objective: Vital Signs: There were no vitals taken  for this visit.  Specialty Comments:  No specialty comments available.  PMFS History: Patient Active Problem List   Diagnosis Date Noted   Toxic metabolic encephalopathy 08/10/2022   Seizure (HCC) 08/10/2022   UTI (urinary tract infection) 08/10/2022   Primary osteoarthritis of right foot    Syncope 02/18/2020   Prolonged QT interval 02/18/2020   Glucose intolerance 02/18/2020   Normocytic anemia 12/10/2019   Poor appetite 09/24/2019   Abnormal weight loss 09/24/2019   Upper abdominal mass 09/24/2019   Dysphagia 01/30/2019   Simple adnexal cyst greater than 1 cm in diameter in postmenopausal patient 11/08/2018   Abdominal pain 08/09/2015   Dysphagia, pharyngoesophageal phase    H/O adenomatous polyp of colon 05/05/2015   Constipation 02/02/2015   Knee contusion 12/09/2013   Esophageal dysphagia 07/02/2013   Abdominal pain, epigastric 07/02/2013   Chronic pain syndrome 11/28/2012   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   Cellulitis 11/04/2012   Postoperative wound breakdown 11/04/2012   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   Knee pain 08/13/2012   Hypokalemia 08/01/2012   Cellulitis, wound, post-operative 07/30/2012   Degenerative tear of medial meniscus of right knee 01/12/2012   OA (osteoarthritis) of knee 01/12/2012   Right knee sprain 11/02/2011   Instability of knee joint 03/29/2011   URTICARIA 12/21/2009   OTHER URINARY INCONTINENCE 07/18/2009   FOOT PAIN, LEFT 05/26/2009   DERMATITIS, CHRONIC 10/21/2008   IMPAIRED GLUCOSE TOLERANCE 09/09/2008   Chronic back pain 09/09/2008   Hypothyroidism 02/27/2008   Hyperlipidemia 02/27/2008   Anxiety with depression 02/27/2008   Bipolar I disorder, most recent episode depressed (HCC) 02/27/2008   Essential hypertension 02/27/2008   ALLERGIC RHINITIS 02/27/2008   GERD 02/27/2008   HEADACHE 02/27/2008   Past Medical History:  Diagnosis Date   Allergic rhinitis    Anxiety    Arthritis    Cataracts,  bilateral    Chronic back pain    Sees pain mamagement clinic   Chronic pain syndrome    Constipation    Depression    Essential hypertension    GERD (gastroesophageal reflux disease)    Headaches, cluster    History of seizures    Hypothyroid    Mania (HCC)    Mixed hyperlipidemia    PTSD (post-traumatic stress disorder)    Right lumbar radiculopathy    Sleep apnea    STOP BANG score=4   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(HCC)     Family History  Problem Relation Age of Onset   Alcohol abuse  Father    Anxiety disorder Father    Alcohol abuse Mother    Anxiety disorder Mother    Alcohol abuse Brother    Anxiety disorder Brother    Drug abuse Brother    Drug abuse Brother    Alcohol abuse Brother    Anxiety disorder Brother    Seizures Brother    Alcohol abuse Brother    Anxiety disorder Brother    Seizures Brother    Alcohol abuse Brother    Anxiety disorder Brother    Alcohol abuse Brother    Anxiety disorder Brother    Breast cancer Sister    Bipolar disorder Sister    Breast cancer Sister    Dementia Paternal Aunt    ADD / ADHD Grandchild    ADD / ADHD Grandchild    Heart disease Other    Diabetes Other    Alcohol abuse Other    Thyroid disease Son    Thyroid disease Daughter    Depression Neg Hx    OCD Neg Hx    Paranoid behavior Neg Hx    Schizophrenia Neg Hx    Sexual abuse Neg Hx    Physical abuse Neg Hx    Colon cancer Neg Hx     Past Surgical History:  Procedure Laterality Date   BACK SURGERY     BILATERAL OOPHORECTOMY     CATARACT EXTRACTION W/PHACO Left 01/04/2016   Procedure: CATARACT EXTRACTION PHACO AND INTRAOCULAR LENS PLACEMENT ([place]);  Surgeon: Rutherford Guys, MD;  Location: [facility];  Service: Ophthalmology;  Laterality: Left;  CDE:4.70   CATARACT EXTRACTION W/PHACO Right 01/18/2016   Procedure: CATARACT EXTRACTION PHACO AND INTRAOCULAR LENS PLACEMENT RIGHT EYE; CDE:  4.66;  Surgeon: Rutherford Guys, MD;  Location: [facility];  Service: Ophthalmology;   Laterality: Right;   CHOLECYSTECTOMY     COLONOSCOPY  10/16/2008   [id number], otherwise normal rectum; pancolonic diverticula the remainder of the colonic mucosa appeared normal. Next TCS due 10/2013.   COLONOSCOPY  06/28/2006   RMR: Internal hemorrhoids. Diminutive rectal polyps, cold biopsied/ Pancolonic diverticula. Polyps in the right colon removed    COLONOSCOPY WITH PROPOFOL N/A 05/20/2015   RMR: Pancolonic diverticulosis. Redundant colon    COLONOSCOPY WITH PROPOFOL N/A 11/11/2020   Procedure: COLONOSCOPY WITH PROPOFOL;  Surgeon: Daneil Dolin, MD;  [facility]: [facility];  Service: Endoscopy;  Laterality: N/A;  pm ASA 2   ESOPHAGOGASTRODUODENOSCOPY  10/16/2008   IOE:VOJJK hiatal hernia otherwise normal esophagus, stomach  D1, D2, status post passage of a 56-French Maloney dilator   ESOPHAGOGASTRODUODENOSCOPY  06/28/2006   KXF:GHWEXH esophagus. Small hiatal hernia. Otherwise normal stomach, D1 and D2, status post passage of a 56 French Maloney dilator   ESOPHAGOGASTRODUODENOSCOPY (EGD) WITH ESOPHAGEAL DILATION N/A 07/14/2013   RMR: Abnormal esophagus of uncertain significance-status post esophageal biospy. small hiatal hernia.    ESOPHAGOGASTRODUODENOSCOPY (EGD) WITH PROPOFOL N/A 05/20/2015   RMR: NORMAL EGD status post maloney dilation    ESOPHAGOGASTRODUODENOSCOPY (EGD) WITH PROPOFOL N/A 12/23/2018   Dr. Gala Romney: normal esophagus, s/p esophageal dilation.    FOOT ARTHRODESIS Right 03/31/2022   Procedure: RIGHT FOOT TARSAL-METATARSAL FUSION;  Surgeon: Newt Minion, MD;  Location: [facility];  Service: Orthopedics;  Laterality: Right;   FOOT SURGERY     left foot-   GASTROCNEMIUS RECESSION Right 03/31/2022   Procedure: RIGHT ACHILLES LENGTHENING;  Surgeon: Newt Minion, MD;  Location: [facility];  Service: Orthopedics;  Laterality: Right;   INCISION AND DRAINAGE Right 01/03/2013  Procedure: INCISION AND DRAINAGE;  Surgeon: Vickki Hearing, MD;  Location: [facility];  Service: Orthopedics;   Laterality: Right;   KNEE ARTHROSCOPY  01/2012   right   KNEE ARTHROSCOPY WITH LATERAL RELEASE Right 01/03/2013   Procedure: Lateral Release Patella Right Knee;  Surgeon: Vickki Hearing, MD;  Location: [facility];  Service: Orthopedics;  Laterality: Right;   LAPAROSCOPIC LYSIS OF ADHESIONS N/A 11/12/2019   Procedure: LAPAROSCOPIC LYSIS OF ADHESIONS, extensive; drainage of peritoneal cyst;  Surgeon: Lazaro Arms, MD;  Location: [facility];  Service: Gynecology;  Laterality: N/A;   LUMBAR DISC SURGERY  L4-5   MALONEY DILATION N/A 05/20/2015   Procedure: Elease Hashimoto DILATION;  Surgeon: Corbin Ade, MD;  Location: [facility];  Service: Endoscopy;  Laterality: N/AElease Hashimoto dilator # [id number]   MALONEY DILATION N/A 12/23/2018   Procedure: Elease Hashimoto DILATION;  Surgeon: Corbin Ade, MD;  [facility];  Service: Endoscopy;  Laterality: N/A;   PARTIAL HYSTERECTOMY     TOTAL KNEE ARTHROPLASTY  07/01/2012   Procedure: TOTAL KNEE ARTHROPLASTY;  Surgeon: Vickki Hearing, MD;  Location: [facility];  Service: Orthopedics;  Laterality: Right;   TOTAL KNEE REVISION Right 01/03/2013   Procedure: Patellaplasty Right Knee;  Surgeon: Vickki Hearing, MD;  Location: [facility];  Service: Orthopedics;  Laterality: Right;   Social History   Occupational History   Occupation: disabled    Associate Professor: UNEMPLOYED  Tobacco Use   Smoking status: Former    Packs/day: 0.25    Years: 5.00    Total pack years: 1.25    Types: Cigarettes    Quit date: 06/26/2003    Years since quitting: 19.2   Smokeless tobacco: Never  Vaping Use   Vaping Use: Never used  Substance and Sexual Activity   Alcohol use: Not Currently    Comment: occasionally; once a month.   Drug use: Not Currently    Comment: has a past history of street drug use   Sexual activity: Yes    Birth control/protection: Surgical    Comment: hyst

## 2022-09-25 ENCOUNTER — Telehealth: Payer: Self-pay

## 2022-09-25 NOTE — Telephone Encounter (Signed)
Please call pt to reschedule her appt. Pt was scheduled with Diane [name] on Thurs 09/28/22 but had to cancel.

## 2022-09-26 ENCOUNTER — Ambulatory Visit: Payer: Self-pay | Admitting: *Deleted

## 2022-09-26 ENCOUNTER — Ambulatory Visit (HOSPITAL_COMMUNITY): Payer: 59 | Admitting: Physical Therapy

## 2022-09-26 DIAGNOSIS — M25571 Pain in right ankle and joints of right foot: Secondary | ICD-10-CM

## 2022-09-26 DIAGNOSIS — R262 Difficulty in walking, not elsewhere classified: Secondary | ICD-10-CM

## 2022-09-26 NOTE — Therapy (Signed)
OUTPATIENT PHYSICAL THERAPY LOWER EXTREMITY TREATMENT   Patient Name: Diane [name] MRN: [id number] DOB:1951/02/18, 72 y.o., female Today's Date: 09/26/2022      PT End of Session - 09/26/22 1122     Visit Number 14    Number of Visits 22    Date for PT Re-Evaluation 10/03/22    Authorization Type [employer] Medicare    Progress Note Due on Visit 20    PT Start Time 1122    PT Stop Time 1156    PT Time Calculation (min) 34 min    Activity Tolerance Patient tolerated treatment well    Behavior During Therapy WFL for tasks assessed/performed                  Past Medical History:  Diagnosis Date   Allergic rhinitis    Anxiety    Arthritis    Cataracts, bilateral    Chronic back pain    Sees pain mamagement clinic   Chronic pain syndrome    Constipation    Depression    Essential hypertension    GERD (gastroesophageal reflux disease)    Headaches, cluster    History of seizures    Hypothyroid    Mania ([place])    Mixed hyperlipidemia    PTSD (post-traumatic stress disorder)    Right lumbar radiculopathy    Sleep apnea    STOP BANG score=4   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[facility])    Past Surgical History:  Procedure Laterality Date   BACK SURGERY     BILATERAL OOPHORECTOMY     CATARACT EXTRACTION W/PHACO Left 01/04/2016   Procedure: CATARACT EXTRACTION PHACO AND INTRAOCULAR LENS PLACEMENT ([place]);  Surgeon: Rutherford Guys, MD;  Location: [facility];  Service: Ophthalmology;  Laterality: Left;  CDE:4.70   CATARACT EXTRACTION W/PHACO Right 01/18/2016   Procedure: CATARACT EXTRACTION PHACO AND INTRAOCULAR LENS PLACEMENT RIGHT EYE; CDE:  4.66;  Surgeon: Rutherford Guys, MD;  Location: [facility];  Service: Ophthalmology;  Laterality: Right;   CHOLECYSTECTOMY     COLONOSCOPY  10/16/2008   NLZ:[id number], otherwise normal rectum; pancolonic diverticula the remainder of the colonic mucosa appeared normal. Next TCS due 10/2013.   COLONOSCOPY  06/28/2006   RMR: Internal hemorrhoids.  Diminutive rectal polyps, cold biopsied/ Pancolonic diverticula. Polyps in the right colon removed    COLONOSCOPY WITH PROPOFOL N/A 05/20/2015   RMR: Pancolonic diverticulosis. Redundant colon    COLONOSCOPY WITH PROPOFOL N/A 11/11/2020   Procedure: COLONOSCOPY WITH PROPOFOL;  Surgeon: Daneil Dolin, MD;  [facility];  Service: Endoscopy;  Laterality: N/A;  pm ASA 2   ESOPHAGOGASTRODUODENOSCOPY  10/16/2008   WIO:XBDZH hiatal hernia otherwise normal esophagus, stomach  D1, D2, status post passage of a 56-French Maloney dilator   ESOPHAGOGASTRODUODENOSCOPY  06/28/2006   GDJ:MEQAST esophagus. Small hiatal hernia. Otherwise normal stomach, D1 and D2, status post passage of a 56 French Maloney dilator   ESOPHAGOGASTRODUODENOSCOPY (EGD) WITH ESOPHAGEAL DILATION N/A 07/14/2013   RMR: Abnormal esophagus of uncertain significance-status post esophageal biospy. small hiatal hernia.    ESOPHAGOGASTRODUODENOSCOPY (EGD) WITH PROPOFOL N/A 05/20/2015   RMR: NORMAL EGD status post maloney dilation    ESOPHAGOGASTRODUODENOSCOPY (EGD) WITH PROPOFOL N/A 12/23/2018   Dr. Gala Romney: normal esophagus, s/p esophageal dilation.    FOOT ARTHRODESIS Right 03/31/2022   Procedure: RIGHT FOOT TARSAL-METATARSAL FUSION;  Surgeon: Newt Minion, MD;  Location: [place];  Service: Orthopedics;  Laterality: Right;   FOOT SURGERY     left foot-   GASTROCNEMIUS  RECESSION Right 03/31/2022   Procedure: RIGHT ACHILLES LENGTHENING;  Surgeon: Newt Minion, MD;  Location: [place];  Service: Orthopedics;  Laterality: Right;   INCISION AND DRAINAGE Right 01/03/2013   Procedure: INCISION AND DRAINAGE;  Surgeon: Carole Civil, MD;  Location: [facility];  Service: Orthopedics;  Laterality: Right;   KNEE ARTHROSCOPY  01/2012   right   KNEE ARTHROSCOPY WITH LATERAL RELEASE Right 01/03/2013   Procedure: Lateral Release Patella Right Knee;  Surgeon: Carole Civil, MD;  Location: [facility];  Service: Orthopedics;  Laterality: Right;    LAPAROSCOPIC LYSIS OF ADHESIONS N/A 11/12/2019   Procedure: LAPAROSCOPIC LYSIS OF ADHESIONS, extensive; drainage of peritoneal cyst;  Surgeon: Florian Buff, MD;  Location: [facility];  Service: Gynecology;  Laterality: N/A;   LUMBAR DISC SURGERY  L4-5   MALONEY DILATION N/A 05/20/2015   Procedure: Venia Minks DILATION;  Surgeon: Daneil Dolin, MD;  Location: [facility];  Service: Endoscopy;  Laterality: N/AVenia Minks dilator # 1   MALONEY DILATION N/A 12/23/2018   Procedure: Venia Minks DILATION;  Surgeon: Daneil Dolin, MD;  [facility];  Service: Endoscopy;  Laterality: N/A;   PARTIAL HYSTERECTOMY     TOTAL KNEE ARTHROPLASTY  07/01/2012   Procedure: TOTAL KNEE ARTHROPLASTY;  Surgeon: Carole Civil, MD;  Location: [facility];  Service: Orthopedics;  Laterality: Right;   TOTAL KNEE REVISION Right 01/03/2013   Procedure: Patellaplasty Right Knee;  Surgeon: Carole Civil, MD;  Location: [facility];  Service: Orthopedics;  Laterality: Right;   Patient Active Problem List   Diagnosis Date Noted   Toxic metabolic encephalopathy 48/88/9169   Seizure ([place]) 08/10/2022   UTI (urinary tract infection) 08/10/2022   Primary osteoarthritis of right foot    Syncope 02/18/2020   Prolonged QT interval 02/18/2020   Glucose intolerance 02/18/2020   Normocytic anemia 12/10/2019   Poor appetite 09/24/2019   Abnormal weight loss 09/24/2019   Upper abdominal mass 09/24/2019   Dysphagia 01/30/2019   Simple adnexal cyst greater than 1 cm in diameter in postmenopausal patient 11/08/2018   Abdominal pain 08/09/2015   Dysphagia, pharyngoesophageal phase    H/O adenomatous polyp of colon 05/05/2015   Constipation 02/02/2015   Knee contusion 12/09/2013   Esophageal dysphagia 07/02/2013   Abdominal pain, epigastric 07/02/2013   Chronic pain syndrome 11/28/2012   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   Cellulitis 11/04/2012   Postoperative wound breakdown 11/04/2012   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  08/13/2012   Knee pain 08/13/2012   Hypokalemia 08/01/2012   Cellulitis, wound, post-operative 07/30/2012   Degenerative tear of medial meniscus of right knee 01/12/2012   OA (osteoarthritis) of knee 01/12/2012   Right knee sprain 11/02/2011   Instability of knee joint 03/29/2011   URTICARIA 12/21/2009   OTHER URINARY INCONTINENCE 07/18/2009   FOOT PAIN, LEFT 05/26/2009   DERMATITIS, CHRONIC 10/21/2008   IMPAIRED GLUCOSE TOLERANCE 09/09/2008   Chronic back pain 09/09/2008   Hypothyroidism 02/27/2008   Hyperlipidemia 02/27/2008   Anxiety with depression 02/27/2008   Bipolar I disorder, most recent episode depressed ([place]) 02/27/2008   Essential hypertension 02/27/2008   ALLERGIC RHINITIS 02/27/2008   GERD 02/27/2008   HEADACHE 02/27/2008    PCP: Redmond School MD  REFERRING PROVIDER: Newt Minion, MD  Next apt: 08/26/22  REFERRING DIAG: I50.388E (ICD-10-CM) - Sprain of tarsometatarsal ligament of right foot, subsequent encounter   THERAPY DIAG:  Pain in right ankle and joints of right foot  Difficulty  in walking, not elsewhere classified  Rationale for Evaluation and Treatment Rehabilitation  ONSET DATE: 03/31/22  SUBJECTIVE:   SUBJECTIVE STATEMENT: Feeling better. Nearly ready for DC. No foot pain today. Still having trouble with balance but denies any recent falls.   Eval: Patient presents with RT foot and ankle pain s/p RT metatarsal fusion on 03/31/20. She was having trouble for several months and they thought it was a sprain. She wore boot for 6 weeks but did not get any better. She was sent for surgery. She is still having trouble walking, still having pain. She says her sx are no better since surgery. She has not had formal therapy for this. She is still taking pain medication and she is walking with a SPC.   PERTINENT HISTORY: Arthritis, HTN, anxiety  PAIN:  Are you having pain? Yes: NPRS scale: 0/10 Pain location: RT dorsal foot down to great toe  Pain  description: throbbing, stinging Aggravating factors: standing, walking Relieving factors: meds, soaking in epsom salt   PRECAUTIONS: None  WEIGHT BEARING RESTRICTIONS No  FALLS:  Has patient fallen in last 6 months? Yes. Number of falls 2  LIVING ENVIRONMENT: Lives with: lives with their partner Lives in: House/apartment Stairs: No Has following equipment at home: Single point cane and Environmental consultant - 2 wheeled  OCCUPATION: Retired   PLOF: Independent with basic ADLs  PATIENT GOALS Decrease pain and swelling    OBJECTIVE:   DIAGNOSTIC FINDINGS: NA  PATIENT SURVEYS:  FOTO 38% (was 45% function)  COGNITION:  Overall cognitive status: Within functional limits for tasks assessed     SENSATION: Reports numbness/ tingling in RT foot   EDEMA:  Min/ mod edema in RT foot   PALPATION: Hyper sensitive about incision and top of RT foot  LOWER EXTREMITY ROM:  Active ROM Right eval Left eval Right 08/22/22 Left 08/22/22  Hip flexion      Hip extension      Hip abduction      Hip adduction      Hip internal rotation      Hip external rotation      Knee flexion      Knee extension      Ankle dorsiflexion -6 4 -20 (has 5 deg PROM) 3  Ankle plantarflexion 10 18 25  48  Ankle inversion  18    Ankle eversion  12     (Blank rows = not tested)  LOWER EXTREMITY MMT:  MMT Right eval Left eval Right 07/19/22 Right 08/22/22 Left 08/22/22  Hip flexion       Hip extension       Hip abduction       Hip adduction       Hip internal rotation       Hip external rotation       Knee flexion       Knee extension       Ankle dorsiflexion 3-  2+ * 3+ 4  Ankle plantarflexion       Ankle inversion 3-  3-* 3+ 4  Ankle eversion 3-  3-* 3- 4   (Blank rows = not tested)  *=pain     FUNCTIONAL TESTS:  2 minute walk test: 180 feet  GAIT: Distance walked: 180 feet  Assistive device utilized: Single point cane Level of assistance: Modified independence Comments: decreased  stride, antalgic, decreased stance on RT    TODAY'S TREATMENT: 09/26/22 Nustep seat 5 min seat 8  2 x 10 heel raise 6 inch  step up 2 x 10 HHA x 1  6 inch step down x10 HHA x 2 Tandem stance 2 x 30" Step over 6 inch hurdles in // bars 4 RT HHA x 2  Slant board 2 x 30"    09/14/22: Standing:  heelraises 10X (minimal Rt heel rise)  Toeraises 10X (minimal toe rise) decline slope to improve mechanics  Tandem stance 3x 30"  SLS Lt 7", Rt 4" max of 3  Vector stance 3x 5" BLE UE support  4 inch step up forward Rt 10X with 1 UE assist  4in step down Rt 10x with 1 UE A  Slant board 3x 30"  09/12/22 Standing:  heelraises 10X (minimal Rt heel rise)  Toeraises 10X (minimal toe rise)  SLS 1-2" max of 5 BLE  Rockerboard A/P and Rt/Lt 1' each bil UE support  Tandem stance 1x 30"  08/29/22 Standing:  heelraises 10X (minimal Rt heel rise)  Toeraises 10X (minimal toe rise)  Rockerboard A/P and Rt/Lt 10X each with bil UE support  4 inch step up forward Rt 10X with 1 UE assist  4 inch lateral step up Rt 10X with 1 UE assist  Slant board stretch 4X30"  Tandem stance Rt and LT lead 1 minute (max 5 sec either LE without UE assist)  08/22/22 Reassess FOTO AROM MMT 2 MWT  Seated calf stretch 2x 30" Ankle band 4 way GTB x 10 each  08/10/22 STW and man ROM to right foot and ankle and big toe x 10'; no other treatment performed during manual treatment.   [place] pick up x 5' Heel slides x 10 on box Dorsiflexion stretch with towel 5 x 10" 4 way RTB 10 x each Ball roll for DF/PF x 2'   08/08/22: Isometric 10x5" pushing into ball BAPS L3 DF/PF; Inv/Ev; CW/CCW RTB 4 way 10x 5" each Towel gastroc stretch 2x 30"    PATIENT EDUCATION:  Education details: on eval findings, POC and HEP  Person educated: Patient Education method: Explanation Education comprehension: verbalized understanding   HOME EXERCISE PROGRAM: Access Code: [email or web address] URL:  [email or web address] 07/04/22 - Seated Ankle Plantar Flexion with Resistance Loop  - 2 x daily - 7 x weekly - 2 sets - 10 reps - Seated Ankle Inversion with Resistance  - 2 x daily - 7 x weekly - 2 sets - 10 reps - Seated Ankle Eversion with Resistance  - 2 x daily - 7 x weekly - 2 sets - 10 reps - Seated Ankle Dorsiflexion with Resistance  - 2 x daily - 7 x weekly - 2 sets - 10 reps  Date: 06/20/2022 Prepared by: Josue Hector  Exercises - Towel Scrunches  - 3 x daily - 7 x weekly - 3 sets - 10 reps - Ankle Inversion Eversion Towel Slide  - 3 x daily - 7 x weekly - 3 sets - 10 reps - Seated Ankle Circles  - 3 x daily - 7 x weekly - 3 sets - 10 reps - Seated Calf Stretch with Strap  - 3 x daily - 7 x weekly - 1 sets - 4 reps - 30 second hold  09/12/22:  tandem stance  ASSESSMENT:  CLINICAL IMPRESSION: Patient showing improved static balance as well as stair mobility. Able to increase step height to 6 inch. Patient did have slight difficulty with descending 6 inch step due to RT ankle DF restriction. Encouraged increased calf/ ankle stretching with HEP as well as standing march for improved ground clearance  when ambulating over obstacles. Patient will continue to benefit from skilled therapy services to reduce remaining deficits and improve functional ability.    OBJECTIVE IMPAIRMENTS Abnormal gait, decreased activity tolerance, decreased balance, decreased endurance, decreased mobility, difficulty walking, decreased ROM, decreased strength, hypomobility, increased edema, increased fascial restrictions, impaired flexibility, impaired sensation, improper body mechanics, and pain.   ACTIVITY LIMITATIONS standing, squatting, stairs, transfers, and locomotion level  PARTICIPATION LIMITATIONS: meal prep, cleaning, laundry, driving, shopping, community activity, and yard work  PERSONAL FACTORS Past/current experiences and Time since onset of injury/illness/exacerbation are  also affecting patient's functional outcome.   REHAB POTENTIAL: Good  CLINICAL DECISION MAKING: Stable/uncomplicated  EVALUATION COMPLEXITY: Low   GOALS: SHORT TERM GOALS: Target date: 07/18/2022  Patient will be independent with initial HEP and self-management strategies to improve functional outcomes Baseline: 07/19/22:  Reports compliance with HEP daily Goal status: MET   2. Patient will have RT ankle DF AROM at least 5 degrees to improve functional mobility, gait mechanics and stair ambulation.  Baseline:-20 deg Goal status: IN PROGRESS   LONG TERM GOALS: Target date: 08/15/2022  Patient will be independent with advanced HEP and self-management strategies to improve functional outcomes Baseline: Goal status: IN PROGRESS  2.  Patient will improve FOTO score to predicted value to indicate improvement in functional outcomes Baseline: 38% function (decreased)  Goal status: IN PROGRESS  3.  Patient will have RT ankle DF AROM at least 10 degrees to improve functional mobility, gait mechanics and stair ambulation.  Baseline: -20 Goal status: IN PROGRESS  4. Patient will have equal to or > 4/5 MMT throughout RT ankle to improve ability to perform functional mobility, stair ambulation and ADLs.  Baseline: See MMT Goal status: IN PROGRESS  5. Patient will be able to ambulate at least 300 feet during with LRAD to demonstrate improved ability to perform functional mobility and associated tasks. Baseline: 180 feet with SPC Goal status: IN PROGRESS   PLAN: PT FREQUENCY: 2x/week  PT DURATION: 6 weeks  PLANNED INTERVENTIONS: Therapeutic exercises, Therapeutic activity, Neuromuscular re-education, Balance training, Gait training, Patient/Family education, Joint manipulation, Joint mobilization, Stair training, Aquatic Therapy, Dry Needling, Electrical stimulation, Spinal manipulation, Spinal mobilization, Cryotherapy, Moist heat, scar mobilization, Taping, Traction, Ultrasound,  Biofeedback, Ionotophoresis 4mg /ml Dexamethasone, and Manual therapy.   PLAN FOR NEXT SESSION:  Balance and obstacles. Likely DC in 1-2 visits if no regressions. Progress ankle strength and mobility as tolerated. Push standing and more functional strengthening.   11:22 AM, 09/26/22 09/28/22 PT DPT  Physical Therapist with [facility]  [phone]

## 2022-09-26 NOTE — Patient Outreach (Signed)
  Care Coordination   09/26/2022  Name: Diane [name] MRN: [id number] DOB: 31-Oct-1950   Care Coordination Outreach Attempts:  An unsuccessful telephone outreach was attempted today to offer the patient information about available care coordination services as a benefit of their health plan. HIPAA compliant message left on voicemail, providing contact information for CSW, encouraging patient to return CSW's call at her earliest convenience.  Follow Up Plan:  Additional outreach attempts will be made to offer the patient care coordination information and services.   Encounter Outcome:  No Answer.   Care Coordination Interventions:  No, not indicated.    Nat Christen, BSW, MSW, LCSW  Licensed Education officer, [facility]  Mailing [place] [address], [place], [place] [address] Physical Address-[address] [place], [place] [address] Toll Free Main # [phone] Fax # [phone] Cell # [phone] [email or web address]

## 2022-09-27 ENCOUNTER — Telehealth: Payer: Self-pay | Admitting: *Deleted

## 2022-09-27 NOTE — Progress Notes (Signed)
  Care Coordination Note  09/27/2022 Name: DEVONNA OBOYLE MRN: [id number] DOB: [birth year]  [name] is a 72 y.o. year old female who is a primary care patient of Redmond School, MD and is actively engaged with the care management team. I reached out to [name] by phone today to assist with re-scheduling an initial visit with the Licensed Clinical Social Worker  Follow up plan: A second unsuccessful telephone outreach attempt made.   [place]  Direct Dial: [phone]

## 2022-09-28 ENCOUNTER — Ambulatory Visit: Payer: Medicare Other | Admitting: Gastroenterology

## 2022-09-28 ENCOUNTER — Other Ambulatory Visit: Payer: Self-pay | Admitting: Orthopedic Surgery

## 2022-09-28 DIAGNOSIS — G894 Chronic pain syndrome: Secondary | ICD-10-CM

## 2022-09-29 ENCOUNTER — Ambulatory Visit (HOSPITAL_COMMUNITY): Payer: 59

## 2022-09-29 DIAGNOSIS — M25571 Pain in right ankle and joints of right foot: Secondary | ICD-10-CM

## 2022-09-29 DIAGNOSIS — R262 Difficulty in walking, not elsewhere classified: Secondary | ICD-10-CM

## 2022-09-29 NOTE — Progress Notes (Signed)
  Care Coordination Note  09/29/2022 Name: Diane [name] MRN: [id number] DOB: 11-30-1950  Diane [name] is a 72 y.o. year old female who is a primary care patient of Redmond School, MD and is actively engaged with the care management team. I reached out to Diane [name] by phone today to assist with re-scheduling an initial visit with the Licensed Clinical Social Worker  Follow up plan: Third unsuccessful telephone outreach attempt made.   [place]  Direct Dial: [phone]

## 2022-09-29 NOTE — Therapy (Signed)
OUTPATIENT PHYSICAL THERAPY LOWER EXTREMITY TREATMENT/ DISCHARGE NOTE PHYSICAL THERAPY DISCHARGE SUMMARY  Visits from Start of Care: 15  Current functional level related to goals / functional outcomes: See below   Remaining deficits: See below   Education / Equipment: See below   Patient agrees to discharge. Patient goals were partially met. Patient is being discharged due to being pleased with the current functional level.    Patient Name: Diane [name] MRN: [id number] DOB:17-Apr-1951, 72 y.o., female Today's Date: 09/29/2022      PT End of Session - 09/29/22 1308     Visit Number 15    Number of Visits 22    Date for PT Re-Evaluation 10/03/22    Authorization Type [employer] Medicare    Progress Note Due on Visit 20    PT Start Time 1305    PT Stop Time 1344    PT Time Calculation (min) 39 min    Activity Tolerance Patient tolerated treatment well    Behavior During Therapy WFL for tasks assessed/performed                  Past Medical History:  Diagnosis Date   Allergic rhinitis    Anxiety    Arthritis    Cataracts, bilateral    Chronic back pain    Sees [facility]   Chronic pain syndrome    Constipation    Depression    Essential hypertension    GERD (gastroesophageal reflux disease)    Headaches, cluster    History of seizures    Hypothyroid    Mania ([place])    Mixed hyperlipidemia    PTSD (post-traumatic stress disorder)    Right lumbar radiculopathy    Sleep apnea    STOP BANG score=4   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[facility])    Past Surgical History:  Procedure Laterality Date   BACK SURGERY     BILATERAL OOPHORECTOMY     CATARACT EXTRACTION W/PHACO Left 01/04/2016   Procedure: CATARACT EXTRACTION PHACO AND INTRAOCULAR LENS PLACEMENT ([place]);  Surgeon: Rutherford Guys, MD;  Location: [facility];  Service: Ophthalmology;  Laterality: Left;  CDE:4.70   CATARACT EXTRACTION W/PHACO Right 01/18/2016   Procedure: CATARACT EXTRACTION PHACO AND  INTRAOCULAR LENS PLACEMENT RIGHT EYE; CDE:  4.66;  Surgeon: Rutherford Guys, MD;  Location: [facility];  Service: Ophthalmology;  Laterality: Right;   CHOLECYSTECTOMY     COLONOSCOPY  10/16/2008   BBC:[id number], otherwise normal rectum; pancolonic diverticula the remainder of the colonic mucosa appeared normal. Next TCS due 10/2013.   COLONOSCOPY  06/28/2006   RMR: Internal hemorrhoids. Diminutive rectal polyps, cold biopsied/ Pancolonic diverticula. Polyps in the right colon removed    COLONOSCOPY WITH PROPOFOL N/A 05/20/2015   RMR: Pancolonic diverticulosis. Redundant colon    COLONOSCOPY WITH PROPOFOL N/A 11/11/2020   Procedure: COLONOSCOPY WITH PROPOFOL;  Surgeon: Daneil Dolin, MD;  Location: [facility];  Service: Endoscopy;  Laterality: N/A;  pm ASA 2   ESOPHAGOGASTRODUODENOSCOPY  10/16/2008   TUU:EKCMK hiatal hernia otherwise normal esophagus, stomach  D1, D2, status post passage of a 56-French Maloney dilator   ESOPHAGOGASTRODUODENOSCOPY  06/28/2006   LKJ:ZPHXTA esophagus. Small hiatal hernia. Otherwise normal stomach, D1 and D2, status post passage of a 56 French Maloney dilator   ESOPHAGOGASTRODUODENOSCOPY (EGD) WITH ESOPHAGEAL DILATION N/A 07/14/2013   RMR: Abnormal esophagus of uncertain significance-status post esophageal biospy. small hiatal hernia.    ESOPHAGOGASTRODUODENOSCOPY (EGD) WITH PROPOFOL N/A 05/20/2015   RMR: NORMAL EGD status post  maloney dilation    ESOPHAGOGASTRODUODENOSCOPY (EGD) WITH PROPOFOL N/A 12/23/2018   Dr. Jena Gauss: normal esophagus, s/p esophageal dilation.    FOOT ARTHRODESIS Right 03/31/2022   Procedure: RIGHT FOOT TARSAL-METATARSAL FUSION;  Surgeon: Nadara Mustard, MD;  Location: [facility] OR;  Service: Orthopedics;  Laterality: Right;   FOOT SURGERY     left foot-   GASTROCNEMIUS RECESSION Right 03/31/2022   Procedure: RIGHT ACHILLES LENGTHENING;  Surgeon: Nadara Mustard, MD;  Location: [facility] OR;  Service: Orthopedics;  Laterality: Right;   INCISION AND DRAINAGE Right  01/03/2013   Procedure: INCISION AND DRAINAGE;  Surgeon: Vickki Hearing, MD;  Location: [facility];  Service: Orthopedics;  Laterality: Right;   KNEE ARTHROSCOPY  01/2012   right   KNEE ARTHROSCOPY WITH LATERAL RELEASE Right 01/03/2013   Procedure: Lateral Release Patella Right Knee;  Surgeon: Vickki Hearing, MD;  Location: [facility];  Service: Orthopedics;  Laterality: Right;   LAPAROSCOPIC LYSIS OF ADHESIONS N/A 11/12/2019   Procedure: LAPAROSCOPIC LYSIS OF ADHESIONS, extensive; drainage of peritoneal cyst;  Surgeon: Lazaro Arms, MD;  Location: [facility];  Service: Gynecology;  Laterality: N/A;   LUMBAR DISC SURGERY  L4-5   MALONEY DILATION N/A 05/20/2015   Procedure: Elease Hashimoto DILATION;  Surgeon: Corbin Ade, MD;  Location: [facility];  Service: Endoscopy;  Laterality: N/AElease Hashimoto dilator # 54   MALONEY DILATION N/A 12/23/2018   Procedure: Elease Hashimoto DILATION;  Surgeon: Corbin Ade, MD;  [facility];  Service: Endoscopy;  Laterality: N/A;   PARTIAL HYSTERECTOMY     TOTAL KNEE ARTHROPLASTY  07/01/2012   Procedure: TOTAL KNEE ARTHROPLASTY;  Surgeon: Vickki Hearing, MD;  Location: [facility];  Service: Orthopedics;  Laterality: Right;   TOTAL KNEE REVISION Right 01/03/2013   Procedure: Patellaplasty Right Knee;  Surgeon: Vickki Hearing, MD;  Location: [facility];  Service: Orthopedics;  Laterality: Right;   Patient Active Problem List   Diagnosis Date Noted   Toxic metabolic encephalopathy 08/10/2022   Seizure (HCC) 08/10/2022   UTI (urinary tract infection) 08/10/2022   Primary osteoarthritis of right foot    Syncope 02/18/2020   Prolonged QT interval 02/18/2020   Glucose intolerance 02/18/2020   Normocytic anemia 12/10/2019   Poor appetite 09/24/2019   Abnormal weight loss 09/24/2019   Upper abdominal mass 09/24/2019   Dysphagia 01/30/2019   Simple adnexal cyst greater than 1 cm in diameter in postmenopausal patient 11/08/2018   Abdominal pain 08/09/2015   Dysphagia,  pharyngoesophageal phase    H/O adenomatous polyp of colon 05/05/2015   Constipation 02/02/2015   Knee contusion 12/09/2013   Esophageal dysphagia 07/02/2013   Abdominal pain, epigastric 07/02/2013   Chronic pain syndrome 11/28/2012   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   Cellulitis 11/04/2012   Postoperative wound breakdown 11/04/2012   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   Knee pain 08/13/2012   Hypokalemia 08/01/2012   Cellulitis, wound, post-operative 07/30/2012   Degenerative tear of medial meniscus of right knee 01/12/2012   OA (osteoarthritis) of knee 01/12/2012   Right knee sprain 11/02/2011   Instability of knee joint 03/29/2011   URTICARIA 12/21/2009   OTHER URINARY INCONTINENCE 07/18/2009   FOOT PAIN, LEFT 05/26/2009   DERMATITIS, CHRONIC 10/21/2008   IMPAIRED GLUCOSE TOLERANCE 09/09/2008   Chronic back pain 09/09/2008   Hypothyroidism 02/27/2008   Hyperlipidemia 02/27/2008   Anxiety with depression 02/27/2008   Bipolar I disorder, most recent episode depressed (HCC) 02/27/2008   Essential hypertension  02/27/2008   ALLERGIC RHINITIS 02/27/2008   GERD 02/27/2008   HEADACHE 02/27/2008    PCP: Elfredia Nevins MD  REFERRING PROVIDER: Nadara Mustard, MD  Next apt: 08/26/22  REFERRING DIAG: Z60.109N (ICD-10-CM) - Sprain of tarsometatarsal ligament of right foot, subsequent encounter   THERAPY DIAG:  Pain in right ankle and joints of right foot  Difficulty in walking, not elsewhere classified  Rationale for Evaluation and Treatment Rehabilitation  ONSET DATE: 03/31/22  SUBJECTIVE:   SUBJECTIVE STATEMENT: Patient states that she hit her big toe yesterday and it is painful; no falls; and no issues with HEP.   Eval: Patient presents with RT foot and ankle pain s/p RT metatarsal fusion on 03/31/20. She was having trouble for several months and they thought it was a sprain. She wore boot for 6 weeks but did not get any better. She was sent for  surgery. She is still having trouble walking, still having pain. She says her sx are no better since surgery. She has not had formal therapy for this. She is still taking pain medication and she is walking with a SPC.   PERTINENT HISTORY: Arthritis, HTN, anxiety  PAIN:  Are you having pain? Yes: NPRS scale: 0/10 Pain location: RT dorsal foot down to great toe  Pain description: throbbing, stinging Aggravating factors: standing, walking Relieving factors: meds, soaking in epsom salt   PRECAUTIONS: None  WEIGHT BEARING RESTRICTIONS No  FALLS:  Has patient fallen in last 6 months? Yes. Number of falls 2  LIVING ENVIRONMENT: Lives with: lives with their partner Lives in: House/apartment Stairs: No Has following equipment at home: Single point cane and Environmental consultant - 2 wheeled  OCCUPATION: Retired   PLOF: Independent with basic ADLs  PATIENT GOALS Decrease pain and swelling    OBJECTIVE:   DIAGNOSTIC FINDINGS: NA  PATIENT SURVEYS:  FOTO 38% (was 45% function)  COGNITION:  Overall cognitive status: Within functional limits for tasks assessed     SENSATION: Reports numbness/ tingling in RT foot   EDEMA:  Min/ mod edema in RT foot   PALPATION: Hyper sensitive about incision and top of RT foot  LOWER EXTREMITY ROM:  Active ROM Right eval Left eval Right 08/22/22 Left 08/22/22 Right 09/29/2022  Hip flexion       Hip extension       Hip abduction       Hip adduction       Hip internal rotation       Hip external rotation       Knee flexion       Knee extension       Ankle dorsiflexion -6 4 -20 (has 5 deg PROM) 3 6  Ankle plantarflexion 10 18 25  48 38  Ankle inversion  18     Ankle eversion  12      (Blank rows = not tested)  LOWER EXTREMITY MMT:  MMT Right eval Left eval Right 07/19/22 Right 08/22/22 Left 08/22/22 Right 09/29/2022  Hip flexion        Hip extension        Hip abduction        Hip adduction        Hip internal rotation        Hip  external rotation        Knee flexion        Knee extension        Ankle dorsiflexion 3-  2+ * 3+ 4 4+*  Ankle plantarflexion  Ankle inversion 3-  3-* 3+ 4 Did not test due to pain of recently stubbed big toe  Ankle eversion 3-  3-* 3- 4 4+   (Blank rows = not tested)  *=pain     FUNCTIONAL TESTS:  2 minute walk test: 180 feet  GAIT: Distance walked: 180 feet  Assistive device utilized: Single point cane Level of assistance: Modified independence Comments: decreased stride, antalgic, decreased stance on RT    TODAY'S TREATMENT: 09/29/22 Progress note FOTO 36 2 MWT 145 ft AROM and MMTs Review of HEP   Seated heel/toe raises x 20   09/26/22 Nustep seat 5 min seat 8  2 x 10 heel raise 6 inch step up 2 x 10 HHA x 1  6 inch step down x10 HHA x 2 Tandem stance 2 x 30" Step over 6 inch hurdles in // bars 4 RT HHA x 2  Slant board 2 x 30"    09/14/22: Standing:  heelraises 10X (minimal Rt heel rise)  Toeraises 10X (minimal toe rise) decline slope to improve mechanics  Tandem stance 3x 30"  SLS Lt 7", Rt 4" max of 3  Vector stance 3x 5" BLE UE support  4 inch step up forward Rt 10X with 1 UE assist  4in step down Rt 10x with 1 UE A  Slant board 3x 30"  09/12/22 Standing:  heelraises 10X (minimal Rt heel rise)  Toeraises 10X (minimal toe rise)  SLS 1-2" max of 5 BLE  Rockerboard A/P and Rt/Lt 1' each bil UE support  Tandem stance 1x 30"  08/29/22 Standing:  heelraises 10X (minimal Rt heel rise)  Toeraises 10X (minimal toe rise)  Rockerboard A/P and Rt/Lt 10X each with bil UE support  4 inch step up forward Rt 10X with 1 UE assist  4 inch lateral step up Rt 10X with 1 UE assist  Slant board stretch 4X30"  Tandem stance Rt and LT lead 1 minute (max 5 sec either LE without UE assist)  08/22/22 Reassess FOTO AROM MMT 2 MWT  Seated calf stretch 2x 30" Ankle band 4 way GTB x 10 each  08/10/22 STW and man ROM to right foot and ankle and big toe x 10';  no other treatment performed during manual treatment.   [place] pick up x 5' Heel slides x 10 on box Dorsiflexion stretch with towel 5 x 10" 4 way RTB 10 x each Ball roll for DF/PF x 2'   08/08/22: Isometric 10x5" pushing into ball BAPS L3 DF/PF; Inv/Ev; CW/CCW RTB 4 way 10x 5" each Towel gastroc stretch 2x 30"    PATIENT EDUCATION:  Education details: on eval findings, POC and HEP  Person educated: Patient Education method: Explanation Education comprehension: verbalized understanding   HOME EXERCISE PROGRAM: Access Code: [email or web address] URL: [email or web address] 07/04/22 - Seated Ankle Plantar Flexion with Resistance Loop  - 2 x daily - 7 x weekly - 2 sets - 10 reps - Seated Ankle Inversion with Resistance  - 2 x daily - 7 x weekly - 2 sets - 10 reps - Seated Ankle Eversion with Resistance  - 2 x daily - 7 x weekly - 2 sets - 10 reps - Seated Ankle Dorsiflexion with Resistance  - 2 x daily - 7 x weekly - 2 sets - 10 reps  Date: 06/20/2022 Prepared by: Josue Hector  Exercises - Towel Scrunches  - 3 x daily - 7 x weekly - 3 sets - 10 reps - Ankle Inversion Eversion Towel Slide  -  3 x daily - 7 x weekly - 3 sets - 10 reps - Seated Ankle Circles  - 3 x daily - 7 x weekly - 3 sets - 10 reps - Seated Calf Stretch with Strap  - 3 x daily - 7 x weekly - 1 sets - 4 reps - 30 second hold  09/12/22:  tandem stance  ASSESSMENT:  CLINICAL IMPRESSION: Patient states she is ready for discharge.  Progress note/discharge visit today.  Patient stubbed her toe so she is limping more today and this affects her 2 MWT and her FOTO scores but despite decline patient feels this is a non related incident and still would like to discharge today.  Reviewed HEP and issued patient RTB to increased intensity of 4 way theraband exercise.  She verbalizes understanding of steps to take should she need more therapy.    OBJECTIVE IMPAIRMENTS Abnormal gait, decreased activity tolerance,  decreased balance, decreased endurance, decreased mobility, difficulty walking, decreased ROM, decreased strength, hypomobility, increased edema, increased fascial restrictions, impaired flexibility, impaired sensation, improper body mechanics, and pain.   ACTIVITY LIMITATIONS standing, squatting, stairs, transfers, and locomotion level  PARTICIPATION LIMITATIONS: meal prep, cleaning, laundry, driving, shopping, community activity, and yard work  PERSONAL FACTORS Past/current experiences and Time since onset of injury/illness/exacerbation are also affecting patient's functional outcome.   REHAB POTENTIAL: Good  CLINICAL DECISION MAKING: Stable/uncomplicated  EVALUATION COMPLEXITY: Low   GOALS: SHORT TERM GOALS: Target date: 07/18/2022  Patient will be independent with initial HEP and self-management strategies to improve functional outcomes Baseline: 07/19/22:  Reports compliance with HEP daily Goal status: MET   2. Patient will have RT ankle DF AROM at least 5 degrees to improve functional mobility, gait mechanics and stair ambulation.  Baseline:-20 deg Goal status: met   LONG TERM GOALS: Target date: 08/15/2022  Patient will be independent with advanced HEP and self-management strategies to improve functional outcomes Baseline: Goal status: MET  2.  Patient will improve FOTO score to predicted value to indicate improvement in functional outcomes Baseline: 38% function (decreased)  Goal status: IN PROGRESS  3.  Patient will have RT ankle DF AROM at least 10 degrees to improve functional mobility, gait mechanics and stair ambulation.  Baseline: -20; 6 09/29/22 Goal status: IN PROGRESS  4. Patient will have equal to or > 4/5 MMT throughout RT ankle to improve ability to perform functional mobility, stair ambulation and ADLs.  Baseline: See MMT Goal status: MET  5. Patient will be able to ambulate at least 300 feet during with LRAD to demonstrate improved ability to perform  functional mobility and associated tasks. Baseline: 180 feet with SPC; 09/29/22 145 ft Goal status: IN PROGRESS   PLAN: PT FREQUENCY: 2x/week  PT DURATION: 6 weeks  PLANNED INTERVENTIONS: Therapeutic exercises, Therapeutic activity, Neuromuscular re-education, Balance training, Gait training, Patient/Family education, Joint manipulation, Joint mobilization, Stair training, Aquatic Therapy, Dry Needling, Electrical stimulation, Spinal manipulation, Spinal mobilization, Cryotherapy, Moist heat, scar mobilization, Taping, Traction, Ultrasound, Biofeedback, Ionotophoresis 4mg /ml Dexamethasone, and Manual therapy.   PLAN FOR NEXT SESSION: discharge.   1:44 PM, 09/29/22 Rayford Williamsen Small Caroly Purewal MPT [facility] [place] [phone]

## 2022-10-03 ENCOUNTER — Encounter (HOSPITAL_COMMUNITY): Payer: Medicare Other | Admitting: Physical Therapy

## 2022-10-05 ENCOUNTER — Encounter (HOSPITAL_COMMUNITY): Payer: Medicare Other | Admitting: Physical Therapy

## 2022-10-05 ENCOUNTER — Other Ambulatory Visit: Payer: Self-pay | Admitting: Orthopedic Surgery

## 2022-10-05 DIAGNOSIS — G894 Chronic pain syndrome: Secondary | ICD-10-CM

## 2022-10-10 ENCOUNTER — Other Ambulatory Visit: Payer: Self-pay | Admitting: Family

## 2022-10-10 ENCOUNTER — Ambulatory Visit: Payer: 59 | Admitting: Gastroenterology

## 2022-10-10 ENCOUNTER — Encounter (HOSPITAL_COMMUNITY): Payer: Medicare Other | Admitting: Physical Therapy

## 2022-10-10 NOTE — Progress Notes (Deleted)
GI Office Note    Referring Provider: Redmond School, MD Primary Care Physician:  Redmond School, MD Primary Gastroenterologist: Cristopher Estimable.Rourk, MD  Date:  10/10/2022  ID:  Diane [name], Diane [name] 05-Sep-1951, MRN [id number]   Chief Complaint   No chief complaint on file.   History of Present Illness  Diane [name] is a 72 y.o. female with a history of GERD, constipation, dysphagia, colonic adenomas, normocytic anemia presenting today for follow-up.  History of exploratory laparotomy with lysis of adhesions in March 2021 and drainage of peritoneal inclusion cyst with severe pelvic abdominal adhesive disease.  Colonoscopy in March 2022: -Melanosis coli -No future colonoscopy needed for screening/surveillance purposes due to age  CT A/P with contrast December 2022 with no acute abnormality.  Stable adnexal cyst and was recommended follow-up ultrasound in 6/12 months.  Virtual visit 06/21/2022.  Taking MiraLAX, lactulose twice daily.  States she is not getting refills on lactulose as she should be.  Has 1 good bowel movement twice per day.  Bowel movements are painful and has lots of cramps until she has 1 but did report some improvement from prior.  Had not been taking Amitiza, stated she did not have any at home.  Takes Symproic when she takes pain medication.  States stool started hard and then soft.  Has swelling and abdominal bloating when she has to go to the bathroom.  Uses occasional suppositories to help.  Per review of dispensing record at this visit she had not filled Amitiza since May 2023 or Symproic since April 2023.  Also had not filled lactulose since May 2023.  Reported GERD controlled with omeprazole 20 mg twice daily.  Having intermittent dysphagia about twice per week for few months.  Advised to complete BPE due to ongoing dysphagia.  Chewing precautions discussed as well as ED precautions.  Advised to take Amitiza 24 mcg twice daily with food and continue lactulose 30 mL twice  daily.  BPE not completed.  Today:   Current Outpatient Medications  Medication Sig Dispense Refill   ALPRAZolam (XANAX) 0.5 MG tablet Take 1 tablet (0.5 mg total) by mouth 3 (three) times daily. 90 tablet 2   atorvastatin (LIPITOR) 20 MG tablet Take 20 mg by mouth daily.     cyclobenzaprine (FLEXERIL) 10 MG tablet Take 10 mg by mouth 3 (three) times daily as needed.     divalproex (DEPAKOTE) 500 MG DR tablet Take 1 tablet (500 mg total) by mouth at bedtime. 30 tablet 0   DULoxetine (CYMBALTA) 60 MG capsule Take 1 capsule (60 mg total) by mouth 2 (two) times daily. 60 capsule 2   hydrocortisone (ANUSOL-HC) 2.5 % rectal cream Place 1 application rectally 2 (two) times daily. (Patient not taking: Reported on 08/10/2022) 30 g 1   levothyroxine (SYNTHROID) 100 MCG tablet Take 100 mcg by mouth daily before breakfast.     lisinopril (PRINIVIL,ZESTRIL) 10 MG tablet Take 10 mg by mouth at bedtime.      lubiprostone (AMITIZA) 24 MCG capsule TAKE ONE CAPSULE BY MOUTH TWICE DAILY WITH A MEAL (Patient not taking: Reported on 08/10/2022) 60 capsule 11   Naldemedine Tosylate (SYMPROIC) 0.2 MG TABS Take 0.2 mg by mouth daily. (Patient not taking: Reported on 08/10/2022)     omeprazole (PRILOSEC) 20 MG capsule TAKE ONE CAPSULE BY MOUTH TWICE DAILY BEFORE A MEAL. 180 capsule 1   oxybutynin (DITROPAN-XL) 10 MG 24 hr tablet Take 10 mg by mouth at bedtime.     oxyCODONE-acetaminophen (PERCOCET)  7.5-325 MG tablet TAKE ONE TABLET BY MOUTH EVERY 8 HOURS AS NEEDED FOR SEVERE PAIN 20 tablet 0   traZODone (DESYREL) 50 MG tablet Take 1 tablet (50 mg total) by mouth at bedtime. 30 tablet 2   No current facility-administered medications for this visit.    Past Medical History:  Diagnosis Date   Allergic rhinitis    Anxiety    Arthritis    Cataracts, bilateral    Chronic back pain    Sees pain mamagement clinic   Chronic pain syndrome    Constipation    Depression    Essential hypertension    GERD  (gastroesophageal reflux disease)    Headaches, cluster    History of seizures    Hypothyroid    Mania (HCC)    Mixed hyperlipidemia    PTSD (post-traumatic stress disorder)    Right lumbar radiculopathy    Sleep apnea    STOP BANG score=4   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[facility])     Past Surgical History:  Procedure Laterality Date   BACK SURGERY     BILATERAL OOPHORECTOMY     CATARACT EXTRACTION W/PHACO Left 01/04/2016   Procedure: CATARACT EXTRACTION PHACO AND INTRAOCULAR LENS PLACEMENT ([place]);  Surgeon: Rutherford Guys, MD;  [facility];  Service: Ophthalmology;  Laterality: Left;  CDE:4.70   CATARACT EXTRACTION W/PHACO Right 01/18/2016   Procedure: CATARACT EXTRACTION PHACO AND INTRAOCULAR LENS PLACEMENT RIGHT EYE; CDE:  4.66;  Surgeon: Rutherford Guys, MD;  Location: [facility];  Service: Ophthalmology;  Laterality: Right;   CHOLECYSTECTOMY     COLONOSCOPY  10/16/2008   [id number], otherwise normal rectum; pancolonic diverticula the remainder of the colonic mucosa appeared normal. Next TCS due 10/2013.   COLONOSCOPY  06/28/2006   RMR: Internal hemorrhoids. Diminutive rectal polyps, cold biopsied/ Pancolonic diverticula. Polyps in the right colon removed    COLONOSCOPY WITH PROPOFOL N/A 05/20/2015   RMR: Pancolonic diverticulosis. Redundant colon    COLONOSCOPY WITH PROPOFOL N/A 11/11/2020   Procedure: COLONOSCOPY WITH PROPOFOL;  Surgeon: Daneil Dolin, MD;  [facility];  Service: Endoscopy;  Laterality: N/A;  pm ASA 2   ESOPHAGOGASTRODUODENOSCOPY  10/16/2008   [id number] hiatal hernia otherwise normal esophagus, stomach  D1, D2, status post passage of a 56-French Maloney dilator   ESOPHAGOGASTRODUODENOSCOPY  06/28/2006   [id number] esophagus. Small hiatal hernia. Otherwise normal stomach, D1 and D2, status post passage of a 56 French Maloney dilator   ESOPHAGOGASTRODUODENOSCOPY (EGD) WITH ESOPHAGEAL DILATION N/A 07/14/2013   RMR: Abnormal esophagus of uncertain  significance-status post esophageal biospy. small hiatal hernia.    ESOPHAGOGASTRODUODENOSCOPY (EGD) WITH PROPOFOL N/A 05/20/2015   RMR: NORMAL EGD status post maloney dilation    ESOPHAGOGASTRODUODENOSCOPY (EGD) WITH PROPOFOL N/A 12/23/2018   Dr. Gala Romney: normal esophagus, s/p esophageal dilation.    FOOT ARTHRODESIS Right 03/31/2022   Procedure: RIGHT FOOT TARSAL-METATARSAL FUSION;  Surgeon: Newt Minion, MD;  Location: [place];  Service: Orthopedics;  Laterality: Right;   FOOT SURGERY     left foot-   GASTROCNEMIUS RECESSION Right 03/31/2022   Procedure: RIGHT ACHILLES LENGTHENING;  Surgeon: Newt Minion, MD;  Location: [place];  Service: Orthopedics;  Laterality: Right;   INCISION AND DRAINAGE Right 01/03/2013   Procedure: INCISION AND DRAINAGE;  Surgeon: Carole Civil, MD;  [facility];  Service: Orthopedics;  Laterality: Right;   KNEE ARTHROSCOPY  01/2012   right   KNEE ARTHROSCOPY WITH LATERAL RELEASE Right 01/03/2013   Procedure: Lateral Release Patella  Right Knee;  Surgeon: Carole Civil, MD;  [facility];  Service: Orthopedics;  Laterality: Right;   LAPAROSCOPIC LYSIS OF ADHESIONS N/A 11/12/2019   Procedure: LAPAROSCOPIC LYSIS OF ADHESIONS, extensive; drainage of peritoneal cyst;  Surgeon: Florian Buff, MD;  [facility];  Service: Gynecology;  Laterality: N/A;   LUMBAR DISC SURGERY  L4-5   MALONEY DILATION N/A 05/20/2015   Procedure: Venia Minks DILATION;  Surgeon: Daneil Dolin, MD;  [facility];  Service: Endoscopy;  Laterality: N/AVenia Minks dilator # [id number]   MALONEY DILATION N/A 12/23/2018   Procedure: Venia Minks DILATION;  Surgeon: Daneil Dolin, MD;  [facility];  Service: Endoscopy;  Laterality: N/A;   PARTIAL HYSTERECTOMY     TOTAL KNEE ARTHROPLASTY  07/01/2012   Procedure: TOTAL KNEE ARTHROPLASTY;  Surgeon: Carole Civil, MD;  [facility];  Service: Orthopedics;  Laterality: Right;   TOTAL KNEE REVISION Right 01/03/2013   Procedure:  Patellaplasty Right Knee;  Surgeon: Carole Civil, MD;  [facility];  Service: Orthopedics;  Laterality: Right;    Family History  Problem Relation Age of Onset   Alcohol abuse Father    Anxiety disorder Father    Alcohol abuse Mother    Anxiety disorder Mother    Alcohol abuse Brother    Anxiety disorder Brother    Drug abuse Brother    Drug abuse Brother    Alcohol abuse Brother    Anxiety disorder Brother    Seizures Brother    Alcohol abuse Brother    Anxiety disorder Brother    Seizures Brother    Alcohol abuse Brother    Anxiety disorder Brother    Alcohol abuse Brother    Anxiety disorder Brother    Breast cancer Sister    Bipolar disorder Sister    Breast cancer Sister    Dementia Paternal Aunt    ADD / ADHD Grandchild    ADD / ADHD Grandchild    Heart disease Other    Diabetes Other    Alcohol abuse Other    Thyroid disease Son    Thyroid disease Daughter    Depression Neg Hx    OCD Neg Hx    Paranoid behavior Neg Hx    Schizophrenia Neg Hx    Sexual abuse Neg Hx    Physical abuse Neg Hx    Colon cancer Neg Hx     Allergies as of 10/10/2022 - Review Complete 09/21/2022  Allergen Reaction Noted   Neomycin-bacitracin zn-polymyx Other (See Comments) 02/27/2008   Penicillins Hives    Sulfonamide derivatives Hives and Swelling 02/27/2008    Social History   Socioeconomic History   Marital status: Legally Separated    Spouse name: Not on file   Number of children: Not on file   Years of education: 12th grade   Highest education level: Not on file  Occupational History   Occupation: disabled    Employer: UNEMPLOYED  Tobacco Use   Smoking status: Former    Packs/day: 0.25    Years: 5.00    Total pack years: 1.25    Types: Cigarettes    Quit date: 06/26/2003    Years since quitting: 19.3   Smokeless tobacco: Never  Vaping Use   Vaping Use: Never used  Substance and Sexual Activity   Alcohol use: Not Currently    Comment:  occasionally; once a month.   Drug use: Not Currently    Comment: has a past  history of street drug use   Sexual activity: Yes    Birth control/protection: Surgical    Comment: hyst  Other Topics Concern   Not on file  Social History Narrative   Not on file   Social Determinants of Health   Financial Resource Strain: Low Risk  (09/15/2022)   Overall Financial Resource Strain (CARDIA)    Difficulty of Paying Living Expenses: Not very hard  Food Insecurity: No Food Insecurity (09/15/2022)   Hunger Vital Sign    Worried About Running Out of Food in the Last Year: Never true    Ran Out of Food in the Last Year: Never true  Transportation Needs: No Transportation Needs (09/15/2022)   PRAPARE - Hydrologist (Medical): No    Lack of Transportation (Non-Medical): No  Physical Activity: Insufficiently Active (09/15/2022)   Exercise Vital Sign    Days of Exercise per Week: 2 days    Minutes of Exercise per Session: 60 min  Stress: No Stress Concern Present (01/24/2022)   [place]    Feeling of Stress : Only a little  Social Connections: Socially Isolated (01/24/2022)   Social Connection and Isolation Panel [NHANES]    Frequency of Communication with Friends and Family: Once a week    Frequency of Social Gatherings with Friends and Family: Once a week    Attends Religious Services: 1 to 4 times per year    Active Member of [employer] or Organizations: No    Attends Archivist Meetings: Never    Marital Status: Separated     Review of Systems   Gen: Denies fever, chills, anorexia. Denies fatigue, weakness, weight loss.  CV: Denies chest pain, palpitations, syncope, peripheral edema, and claudication. Resp: Denies dyspnea at rest, cough, wheezing, coughing up blood, and pleurisy. GI: See HPI Derm: Denies rash, itching, dry skin Psych: Denies depression, anxiety, memory loss, confusion. No  homicidal or suicidal ideation.  Heme: Denies bruising, bleeding, and enlarged lymph nodes.   Physical Exam   There were no vitals taken for this visit.  General:   Alert and oriented. No distress noted. Pleasant and cooperative.  Head:  Normocephalic and atraumatic. Eyes:  Conjuctiva clear without scleral icterus. Mouth:  Oral mucosa pink and moist. Good dentition. No lesions. Lungs:  Clear to auscultation bilaterally. No wheezes, rales, or rhonchi. No distress.  Heart:  S1, S2 present without murmurs appreciated.  Abdomen:  +BS, soft, non-tender and non-distended. No rebound or guarding. No HSM or masses noted. Rectal: *** Msk:  Symmetrical without gross deformities. Normal posture. Extremities:  Without edema. Neurologic:  Alert and  oriented x4 Psych:  Alert and cooperative. Normal mood and affect.   Assessment  Diane [name] is a 72 y.o. female with a history of GERD, constipation, dysphagia, colonic adenomas, normocytic anemia presenting today for follow-up.  Constipation:   GERD:   Dysphagia:   PLAN   ***     [name], MSN, FNP-BC, AGACNP-BC [facility]

## 2022-10-12 ENCOUNTER — Encounter (HOSPITAL_COMMUNITY): Payer: Medicare Other | Admitting: Physical Therapy

## 2022-10-17 ENCOUNTER — Other Ambulatory Visit (HOSPITAL_COMMUNITY): Payer: Self-pay | Admitting: Psychiatry

## 2022-10-19 ENCOUNTER — Ambulatory Visit (INDEPENDENT_AMBULATORY_CARE_PROVIDER_SITE_OTHER): Payer: 59 | Admitting: Orthopedic Surgery

## 2022-10-19 ENCOUNTER — Encounter: Payer: Self-pay | Admitting: Orthopedic Surgery

## 2022-10-19 ENCOUNTER — Other Ambulatory Visit: Payer: Self-pay | Admitting: Orthopedic Surgery

## 2022-10-19 DIAGNOSIS — M2021 Hallux rigidus, right foot: Secondary | ICD-10-CM

## 2022-10-19 DIAGNOSIS — M6701 Short Achilles tendon (acquired), right ankle: Secondary | ICD-10-CM

## 2022-10-19 DIAGNOSIS — S92324G Nondisplaced fracture of second metatarsal bone, right foot, subsequent encounter for fracture with delayed healing: Secondary | ICD-10-CM | POA: Diagnosis not present

## 2022-10-19 DIAGNOSIS — G894 Chronic pain syndrome: Secondary | ICD-10-CM

## 2022-10-19 NOTE — Progress Notes (Signed)
Office Visit Note   Patient: Diane [name]           Date of Birth: 1951/03/22           MRN: [id number] Visit Date: 10/19/2022              Requested by: Elfredia Nevins, MD [address] [place],  [place] [address] PCP: Elfredia Nevins, MD  Chief Complaint  Patient presents with   Right Foot - Follow-up    03/31/2022 right foot tarsal MT fusion and achilles lengthening      HPI: Patient is a 72 year old woman who is 6 months status post fusion across the Lisfranc complex for fracture dislocation.  Patient also underwent gastrocnemius recession for Achilles contracture she has pain across the great toe MTP joint.  Assessment & Plan: Visit Diagnoses:  1. Short Achilles tendon (acquired), right ankle   2. Closed nondisplaced fracture of second metatarsal bone of right foot with delayed healing, subsequent encounter   3. Hallux rigidus, right foot     Plan: Recommended continue Achilles stretching.  Obtaining a carbon orthotic to be placed in the right shoe was written down for her to obtain.  Follow-Up Instructions: Return if symptoms worsen or fail to improve.   Ortho Exam  Patient is alert, oriented, no adenopathy, well-dressed, normal affect, normal respiratory effort. Examination patient has dorsiflexion to 10 degrees she has a palpable dorsalis pedis pulse the fusion site is well-healed and nontender to palpation.  She has pain to palpation beneath the great toe MTP joint she has dorsiflexion of the great toe 30 degrees plantarflexion of 0 degrees.  Imaging: No results found. No images are attached to the encounter.  Labs: Lab Results  Component Value Date   HGBA1C 5.5 02/19/2020   HGBA1C 5.8 (H) 01/25/2012   HGBA1C 5.7 07/13/2009   ESRSEDRATE 2 03/26/2018   ESRSEDRATE 4 03/16/2014   ESRSEDRATE 40 (H) 12/30/2012   CRP 0.8 03/26/2018   CRP <0.5 03/16/2014   CRP 1.6 (H) 12/30/2012   REPTSTATUS 08/13/2022 FINAL 08/10/2022   GRAMSTAIN  01/03/2013    RARE  WBC PRESENT, PREDOMINANTLY MONONUCLEAR NO SQUAMOUS EPITHELIAL CELLS SEEN NO ORGANISMS SEEN   GRAMSTAIN  01/03/2013    RARE WBC PRESENT, PREDOMINANTLY PMN NO SQUAMOUS EPITHELIAL CELLS SEEN NO ORGANISMS SEEN   GRAMSTAIN  01/03/2013    RARE WBC PRESENT,BOTH PMN AND MONONUCLEAR NO SQUAMOUS EPITHELIAL CELLS SEEN NO ORGANISMS SEEN   GRAMSTAIN  01/03/2013    RARE WBC PRESENT, PREDOMINANTLY PMN NO SQUAMOUS EPITHELIAL CELLS SEEN NO ORGANISMS SEEN   GRAMSTAIN  01/03/2013    RARE WBC PRESENT,BOTH PMN AND MONONUCLEAR NO SQUAMOUS EPITHELIAL CELLS SEEN NO ORGANISMS SEEN   GRAMSTAIN  01/03/2013    RARE WBC PRESENT, PREDOMINANTLY MONONUCLEAR NO SQUAMOUS EPITHELIAL CELLS SEEN NO ORGANISMS SEEN   CULT (A) 08/10/2022    >=100,000 COLONIES/mL ESCHERICHIA COLI 50,000 COLONIES/mL KLEBSIELLA PNEUMONIAE    LABORGA ESCHERICHIA COLI (A) 08/10/2022   LABORGA KLEBSIELLA PNEUMONIAE (A) 08/10/2022     Lab Results  Component Value Date   ALBUMIN 3.2 (L) 08/10/2022   ALBUMIN 3.5 02/18/2020   ALBUMIN 3.4 (L) 11/10/2019    Lab Results  Component Value Date   MG 1.9 08/12/2022   MG 1.9 08/10/2022   MG 1.7 02/18/2020   No results found for: "VD25OH"  No results found for: "PREALBUMIN"    Latest Ref Rng & Units 08/12/2022    4:08 AM 08/10/2022    1:18 PM 08/10/2022  1:09 PM  CBC EXTENDED  WBC 4.0 - 10.5 K/uL 4.5   4.7   RBC 3.87 - 5.11 MIL/uL 3.90   3.71   Hemoglobin 12.0 - 15.0 g/dL 10.9   10.6   HCT 36.0 - 46.0 % 35.6   33.9   Platelets 150 - 400 K/uL 200   211   NEUT# 1.7 - 7.7 K/uL  1.8    Lymph# 0.7 - 4.0 K/uL  2.2       There is no height or weight on file to calculate BMI.  Orders:  No orders of the defined types were placed in this encounter.  No orders of the defined types were placed in this encounter.    Procedures: No procedures performed  Clinical Data: No additional findings.  ROS:  All other systems negative, except as noted in the HPI. Review of  Systems  Objective: Vital Signs: There were no vitals taken for this visit.  Specialty Comments:  No specialty comments available.  PMFS History: Patient Active Problem List   Diagnosis Date Noted   Toxic metabolic encephalopathy 93/81/8299   Seizure ([place]) 08/10/2022   UTI (urinary tract infection) 08/10/2022   Primary osteoarthritis of right foot    Syncope 02/18/2020   Prolonged QT interval 02/18/2020   Glucose intolerance 02/18/2020   Normocytic anemia 12/10/2019   Poor appetite 09/24/2019   Abnormal weight loss 09/24/2019   Upper abdominal mass 09/24/2019   Dysphagia 01/30/2019   Simple adnexal cyst greater than 1 cm in diameter in postmenopausal patient 11/08/2018   Abdominal pain 08/09/2015   Dysphagia, pharyngoesophageal phase    H/O adenomatous polyp of colon 05/05/2015   Constipation 02/02/2015   Knee contusion 12/09/2013   Esophageal dysphagia 07/02/2013   Abdominal pain, epigastric 07/02/2013   Chronic pain syndrome 11/28/2012   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   Cellulitis 11/04/2012   Postoperative wound breakdown 11/04/2012   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   Knee pain 08/13/2012   Hypokalemia 08/01/2012   Cellulitis, wound, post-operative 07/30/2012   Degenerative tear of medial meniscus of right knee 01/12/2012   OA (osteoarthritis) of knee 01/12/2012   Right knee sprain 11/02/2011   Instability of knee joint 03/29/2011   URTICARIA 12/21/2009   OTHER URINARY INCONTINENCE 07/18/2009   FOOT PAIN, LEFT 05/26/2009   DERMATITIS, CHRONIC 10/21/2008   IMPAIRED GLUCOSE TOLERANCE 09/09/2008   Chronic back pain 09/09/2008   Hypothyroidism 02/27/2008   Hyperlipidemia 02/27/2008   Anxiety with depression 02/27/2008   Bipolar I disorder, most recent episode depressed ([place]) 02/27/2008   Essential hypertension 02/27/2008   ALLERGIC RHINITIS 02/27/2008   GERD 02/27/2008   HEADACHE 02/27/2008   Past Medical History:  Diagnosis Date    Allergic rhinitis    Anxiety    Arthritis    Cataracts, bilateral    Chronic back pain    Sees pain mamagement clinic   Chronic pain syndrome    Constipation    Depression    Essential hypertension    GERD (gastroesophageal reflux disease)    Headaches, cluster    History of seizures    Hypothyroid    Mania (HCC)    Mixed hyperlipidemia    PTSD (post-traumatic stress disorder)    Right lumbar radiculopathy    Sleep apnea    STOP BANG score=4   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([place])     Family History  Problem Relation Age of Onset   Alcohol abuse Father    Anxiety disorder  Father    Alcohol abuse Mother    Anxiety disorder Mother    Alcohol abuse Brother    Anxiety disorder Brother    Drug abuse Brother    Drug abuse Brother    Alcohol abuse Brother    Anxiety disorder Brother    Seizures Brother    Alcohol abuse Brother    Anxiety disorder Brother    Seizures Brother    Alcohol abuse Brother    Anxiety disorder Brother    Alcohol abuse Brother    Anxiety disorder Brother    Breast cancer Sister    Bipolar disorder Sister    Breast cancer Sister    Dementia Paternal Aunt    ADD / ADHD Grandchild    ADD / ADHD Grandchild    Heart disease Other    Diabetes Other    Alcohol abuse Other    Thyroid disease Son    Thyroid disease Daughter    Depression Neg Hx    OCD Neg Hx    Paranoid behavior Neg Hx    Schizophrenia Neg Hx    Sexual abuse Neg Hx    Physical abuse Neg Hx    Colon cancer Neg Hx     Past Surgical History:  Procedure Laterality Date   BACK SURGERY     BILATERAL OOPHORECTOMY     CATARACT EXTRACTION W/PHACO Left 01/04/2016   Procedure: CATARACT EXTRACTION PHACO AND INTRAOCULAR LENS PLACEMENT ([place]);  Surgeon: Rutherford Guys, MD;  [facility];  Service: Ophthalmology;  Laterality: Left;  CDE:4.70   CATARACT EXTRACTION W/PHACO Right 01/18/2016   Procedure: CATARACT EXTRACTION PHACO AND INTRAOCULAR LENS PLACEMENT RIGHT EYE; CDE:  4.66;  Surgeon: Rutherford Guys, MD;  Location: [facility];  Service: Ophthalmology;  Laterality: Right;   CHOLECYSTECTOMY     COLONOSCOPY  10/16/2008   [id number], otherwise normal rectum; pancolonic diverticula the remainder of the colonic mucosa appeared normal. Next TCS due 10/2013.   COLONOSCOPY  06/28/2006   RMR: Internal hemorrhoids. Diminutive rectal polyps, cold biopsied/ Pancolonic diverticula. Polyps in the right colon removed    COLONOSCOPY WITH PROPOFOL N/A 05/20/2015   RMR: Pancolonic diverticulosis. Redundant colon    COLONOSCOPY WITH PROPOFOL N/A 11/11/2020   Procedure: COLONOSCOPY WITH PROPOFOL;  Surgeon: Daneil Dolin, MD;  [facility];  Service: Endoscopy;  Laterality: N/A;  pm ASA 2   ESOPHAGOGASTRODUODENOSCOPY  10/16/2008   [id number] hiatal hernia otherwise normal esophagus, stomach  D1, D2, status post passage of a 56-French Maloney dilator   ESOPHAGOGASTRODUODENOSCOPY  06/28/2006   [id number] esophagus. Small hiatal hernia. Otherwise normal stomach, D1 and D2, status post passage of a 56 French Maloney dilator   ESOPHAGOGASTRODUODENOSCOPY (EGD) WITH ESOPHAGEAL DILATION N/A 07/14/2013   RMR: Abnormal esophagus of uncertain significance-status post esophageal biospy. small hiatal hernia.    ESOPHAGOGASTRODUODENOSCOPY (EGD) WITH PROPOFOL N/A 05/20/2015   RMR: NORMAL EGD status post maloney dilation    ESOPHAGOGASTRODUODENOSCOPY (EGD) WITH PROPOFOL N/A 12/23/2018   Dr. Gala Romney: normal esophagus, s/p esophageal dilation.    FOOT ARTHRODESIS Right 03/31/2022   Procedure: RIGHT FOOT TARSAL-METATARSAL FUSION;  Surgeon: Newt Minion, MD;  Location: [place];  Service: Orthopedics;  Laterality: Right;   FOOT SURGERY     left foot-   GASTROCNEMIUS RECESSION Right 03/31/2022   Procedure: RIGHT ACHILLES LENGTHENING;  Surgeon: Newt Minion, MD;  Location: [place];  Service: Orthopedics;  Laterality: Right;   INCISION AND DRAINAGE Right 01/03/2013   Procedure: INCISION AND DRAINAGE;  Surgeon: Carole Civil, MD;  [facility];  Service: Orthopedics;  Laterality: Right;   KNEE ARTHROSCOPY  01/2012   right   KNEE ARTHROSCOPY WITH LATERAL RELEASE Right 01/03/2013   Procedure: Lateral Release Patella Right Knee;  Surgeon: Carole Civil, MD;  [facility];  Service: Orthopedics;  Laterality: Right;   LAPAROSCOPIC LYSIS OF ADHESIONS N/A 11/12/2019   Procedure: LAPAROSCOPIC LYSIS OF ADHESIONS, extensive; drainage of peritoneal cyst;  Surgeon: Florian Buff, MD;  [facility];  Service: Gynecology;  Laterality: N/A;   LUMBAR DISC SURGERY  L4-5   MALONEY DILATION N/A 05/20/2015   Procedure: Venia Minks DILATION;  Surgeon: Daneil Dolin, MD;  [facility];  Service: Endoscopy;  Laterality: N/AVenia Minks dilator # 72   MALONEY DILATION N/A 12/23/2018   Procedure: Venia Minks DILATION;  Surgeon: Daneil Dolin, MD;  [facility];  Service: Endoscopy;  Laterality: N/A;   PARTIAL HYSTERECTOMY     TOTAL KNEE ARTHROPLASTY  07/01/2012   Procedure: TOTAL KNEE ARTHROPLASTY;  Surgeon: Carole Civil, MD;  [facility];  Service: Orthopedics;  Laterality: Right;   TOTAL KNEE REVISION Right 01/03/2013   Procedure: Patellaplasty Right Knee;  Surgeon: Carole Civil, MD;  [facility];  Service: Orthopedics;  Laterality: Right;   Social History   Occupational History   Occupation: disabled    Fish farm manager: UNEMPLOYED  Tobacco Use   Smoking status: Former    Packs/day: 0.25    Years: 5.00    Total pack years: 1.25    Types: Cigarettes    Quit date: 06/26/2003    Years since quitting: 19.3   Smokeless tobacco: Never  Vaping Use   Vaping Use: Never used  Substance and Sexual Activity   Alcohol use: Not Currently    Comment: occasionally; once a month.   Drug use: Not Currently    Comment: has a past history of street drug use   Sexual activity: Yes    Birth control/protection: Surgical    Comment: hyst

## 2022-10-20 ENCOUNTER — Telehealth: Payer: Self-pay | Admitting: Orthopedic Surgery

## 2022-10-20 NOTE — Telephone Encounter (Signed)
Patient lvm requesting a refill on Oxycodone 7.5-325, quantity 20, 1 every 8hrs to be sent to [facility].

## 2022-10-23 ENCOUNTER — Telehealth: Payer: Self-pay | Admitting: Orthopedic Surgery

## 2022-10-23 NOTE — Telephone Encounter (Signed)
Patient called, she is checking on her refill request, she wants to know why it hasn't been sent in.  Pt's # [phone]

## 2022-10-23 NOTE — Telephone Encounter (Signed)
I called to let her know it will be tomorrow she has voiced understanding

## 2022-10-31 ENCOUNTER — Other Ambulatory Visit: Payer: Self-pay | Admitting: Orthopedic Surgery

## 2022-10-31 DIAGNOSIS — G894 Chronic pain syndrome: Secondary | ICD-10-CM

## 2022-11-01 ENCOUNTER — Ambulatory Visit (INDEPENDENT_AMBULATORY_CARE_PROVIDER_SITE_OTHER): Payer: 59 | Admitting: Psychiatry

## 2022-11-01 DIAGNOSIS — F3130 Bipolar disorder, current episode depressed, mild or moderate severity, unspecified: Secondary | ICD-10-CM
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
## 2022-11-06 ENCOUNTER — Telehealth (HOSPITAL_COMMUNITY): Payer: 59 | Admitting: Psychiatry

## 2022-11-07 ENCOUNTER — Other Ambulatory Visit: Payer: Self-pay | Admitting: Orthopedic Surgery

## 2022-11-07 DIAGNOSIS — G894 Chronic pain syndrome: Secondary | ICD-10-CM

## 2022-11-07 DIAGNOSIS — S93491A Sprain of other ligament of right ankle, initial encounter: Secondary | ICD-10-CM

## 2022-11-07 DIAGNOSIS — S93601A Unspecified sprain of right foot, initial encounter: Secondary | ICD-10-CM

## 2022-11-09 ENCOUNTER — Encounter: Payer: Self-pay | Admitting: Radiology

## 2022-11-10 DIAGNOSIS — E039 Hypothyroidism, unspecified: Secondary | ICD-10-CM | POA: Diagnosis not present

## 2022-11-10 DIAGNOSIS — Z0001 Encounter for general adult medical examination with abnormal findings: Secondary | ICD-10-CM | POA: Diagnosis not present

## 2022-11-14 ENCOUNTER — Other Ambulatory Visit: Payer: Self-pay | Admitting: Orthopedic Surgery

## 2022-11-14 DIAGNOSIS — G894 Chronic pain syndrome: Secondary | ICD-10-CM

## 2022-11-14 NOTE — Telephone Encounter (Signed)
Returned the patient's call, she stated that [facility] doesn't have they Oxycodone 7.5-325 and that they were supposed to reach out to Dr. Aline Brochure to see what he'd like to do.  Pt's # [phone]

## 2022-11-15 ENCOUNTER — Other Ambulatory Visit (HOSPITAL_COMMUNITY): Payer: Self-pay | Admitting: Psychiatry

## 2022-11-15 DIAGNOSIS — F5105 Insomnia due to other mental disorder: Secondary | ICD-10-CM

## 2022-11-15 MED ORDER — OXYCODONE-ACETAMINOPHEN 5-325 MG PO TABS: 1.0000 | ORAL_TABLET | Freq: Three times a day (TID) | ORAL | 0 refills | Status: DC | PRN

## 2022-11-17 ENCOUNTER — Ambulatory Visit: Payer: Self-pay | Admitting: *Deleted

## 2022-11-17 NOTE — Patient Outreach (Signed)
  Care Coordination   11/17/2022 Name: Diane [name] MRN: [id number] DOB: [birth year]   Care Coordination Outreach Attempts:  An unsuccessful telephone outreach was attempted for a scheduled appointment today.  Follow Up Plan:  No further outreach attempts will be made at this time. We have been unable to contact the patient to offer or enroll patient in care coordination services  Encounter Outcome:  No Answer   Care Coordination Interventions:  No, not indicated    Chong Sicilian, BSN, RN-BC RN Care Coordinator [place]: [phone] Main #: [phone]

## 2022-11-22 ENCOUNTER — Other Ambulatory Visit: Payer: Self-pay | Admitting: Orthopedic Surgery

## 2022-11-22 ENCOUNTER — Telehealth: Payer: Self-pay | Admitting: Orthopedic Surgery

## 2022-11-22 NOTE — Telephone Encounter (Signed)
Patient called requesting a refill on her Oxycodone 7.5-325, 20 quantity, 1 every 8 hours to be sent to [facility].

## 2022-11-23 MED ORDER — OXYCODONE-ACETAMINOPHEN 5-325 MG PO TABS: 1.0000 | ORAL_TABLET | Freq: Three times a day (TID) | ORAL | 0 refills | Status: DC | PRN

## 2022-11-23 NOTE — Addendum Note (Signed)
Addended by: Larena Glassman A on: 11/23/2022 10:48 PM   Modules accepted: Orders

## 2022-11-27 ENCOUNTER — Other Ambulatory Visit: Payer: Self-pay | Admitting: Orthopedic Surgery

## 2022-11-27 MED ORDER — OXYCODONE-ACETAMINOPHEN 7.5-325 MG PO TABS: 1.0000 | ORAL_TABLET | ORAL | 0 refills | Status: DC | PRN

## 2022-11-27 NOTE — Telephone Encounter (Signed)
Spoke w/the patient, she is requesting a refill on Oxycodone.  Dr. Amedeo Kinsman prescribed 5-325 last time and the patient stated that she is taking 2 pills at a time.  She is requesting 7.5-325 to be sent in.  She uses [name].  Pt's # [phone].

## 2022-11-28 ENCOUNTER — Telehealth: Payer: Self-pay | Admitting: *Deleted

## 2022-11-28 NOTE — Progress Notes (Signed)
  Care Coordination Note  11/28/2022 Name: Diane [name] MRN: [id number] DOB: 04-29-1951  Diane [name] is a 72 y.o. year old female who is a primary care patient of [employer], [facility] and is actively engaged with the care management team. I reached out to Diane [name] by phone today to assist with re-scheduling a follow up visit with the RN Case Manager  Follow up plan: Unsuccessful telephone outreach attempt made.  [place]  Direct Dial: [phone]

## 2022-12-01 NOTE — Progress Notes (Signed)
  Care Coordination Note  12/01/2022 Name: Diane [name] MRN: [id number] DOB: 07/31/1951  Diane [name] is a 72 y.o. year old female who is a primary care patient of [employer], [facility] and is actively engaged with the care management team. I reached out to Diane [name] by phone today to assist with re-scheduling a follow up visit with the RN Case Manager  Follow up plan: Telephone appointment with care management team member scheduled for:12/13/22  [place]: [phone]

## 2022-12-04 ENCOUNTER — Other Ambulatory Visit (HOSPITAL_COMMUNITY): Payer: Self-pay | Admitting: Internal Medicine

## 2022-12-04 ENCOUNTER — Other Ambulatory Visit: Payer: Self-pay | Admitting: Orthopedic Surgery

## 2022-12-04 DIAGNOSIS — Z1231 Encounter for screening mammogram for malignant neoplasm of breast: Secondary | ICD-10-CM

## 2022-12-05 ENCOUNTER — Telehealth: Payer: Self-pay | Admitting: Orthopedic Surgery

## 2022-12-05 NOTE — Telephone Encounter (Signed)
Dr. Ruthe Mannan pt - Diane [name] with [facility] [phone] called, lvm stating that the do not have the Oxycodone 7.5-325 in stock, would like to know if the doctor will change to 5-325 or 10-325, they have both of those in stock.

## 2022-12-06 ENCOUNTER — Other Ambulatory Visit: Payer: Self-pay | Admitting: Orthopedic Surgery

## 2022-12-06 MED ORDER — OXYCODONE-ACETAMINOPHEN 10-325 MG PO TABS: 1.0000 | ORAL_TABLET | ORAL | 0 refills | Status: DC | PRN

## 2022-12-06 NOTE — Progress Notes (Signed)
Meds ordered this encounter  Medications   oxyCODONE-acetaminophen (PERCOCET) 10-325 MG tablet    Sig: Take 1 tablet by mouth every 4 (four) hours as needed for up to 5 days for pain.    Dispense:  30 tablet    Refill:  0    

## 2022-12-10 DIAGNOSIS — K219 Gastro-esophageal reflux disease without esophagitis: Secondary | ICD-10-CM | POA: Diagnosis not present

## 2022-12-10 DIAGNOSIS — E039 Hypothyroidism, unspecified: Secondary | ICD-10-CM | POA: Diagnosis not present

## 2022-12-10 DIAGNOSIS — I10 Essential (primary) hypertension: Secondary | ICD-10-CM | POA: Diagnosis not present

## 2022-12-12 ENCOUNTER — Telehealth (HOSPITAL_COMMUNITY): Payer: Self-pay | Admitting: Psychiatry

## 2022-12-12 ENCOUNTER — Ambulatory Visit (HOSPITAL_COMMUNITY): Payer: 59 | Admitting: Psychiatry

## 2022-12-12 NOTE — Telephone Encounter (Signed)
Therapist contacted patient via phone regarding scheduled in person appointment.  Patient indicated that she did not have transportation for appointment.  Therapist offered patient opportunity to do video visit.  Patient was unable to do video visit due to technical issues.  Patient was reminded of upcoming in person appointments in May.

## 2022-12-13 ENCOUNTER — Ambulatory Visit: Payer: Self-pay | Admitting: *Deleted

## 2022-12-13 ENCOUNTER — Encounter: Payer: Self-pay | Admitting: *Deleted

## 2022-12-13 NOTE — Patient Outreach (Signed)
  Care Coordination   Follow Up Visit Note   12/13/2022 Name: Diane [name] MRN: [id number] DOB: 08-13-1951  Diane [name] is a 72 y.o. year old female who sees [facility] [employer], MD for primary care. I spoke with  Diane [name] by phone today.  What matters to the patients health and wellness today?  Following up with counselor and ortho    Goals Addressed             This Visit's Progress    Care Coordination Services       Care Coordination Goals: Patient will follow-up with PCP and/or specialist(s) as recommended Maurice Small, LCSW on 01/22/23 Dr Aline Brochure (ortho) 12/25/22 for foot pain Verified transportation to appointments Patient will take medications as prescribed Patient will reach out to [place] Management Dept at [phone] with any care coordination or resource needs  Patient will utilize the [facility] 24 Hour Nurse/Concierge Line as needed [phone]         SDOH assessments and interventions completed:  Yes  SDOH Interventions Today    Flowsheet Row Most Recent Value  SDOH Interventions   Transportation Interventions Intervention Not Indicated  Financial Strain Interventions Intervention Not Indicated       Care Coordination Interventions:  Yes, provided  Interventions Today    Flowsheet Row Most Recent Value  Chronic Disease   Chronic disease during today's visit Other  [foot pain, bipolar disorder]  General Interventions   General Interventions Discussed/Reviewed General Interventions Discussed, General Interventions Reviewed, Doctor Visits  Doctor Visits Discussed/Reviewed Doctor Visits Discussed, Specialist, PCP, Doctor Visits Reviewed  PCP/Specialist Visits Compliance with follow-up visit  Exercise Interventions   Exercise Discussed/Reviewed Physical Activity  Physical Activity Discussed/Reviewed Physical Activity Discussed, Physical Activity Reviewed  Education Interventions   Education Provided Provided Education  Provided Verbal Education On  When to see the doctor, [employer]  [place] transportation need. advised to reach out to Care Coordination team with any transportation or other resource needs]  Safety Interventions   Safety Discussed/Reviewed Safety Discussed, Home Safety, Safety Reviewed       Follow up plan: Follow up call scheduled for 01/15/23    Encounter Outcome:  Pt. Visit Completed   Chong Sicilian, BSN, RN-BC RN Care Coordinator [place] Direct Dial: [phone] Main #: [phone]

## 2022-12-14 ENCOUNTER — Other Ambulatory Visit: Payer: Self-pay | Admitting: Orthopedic Surgery

## 2022-12-16 ENCOUNTER — Other Ambulatory Visit (HOSPITAL_COMMUNITY): Payer: Self-pay | Admitting: Psychiatry

## 2022-12-16 DIAGNOSIS — F5105 Insomnia due to other mental disorder: Secondary | ICD-10-CM

## 2022-12-18 ENCOUNTER — Ambulatory Visit (HOSPITAL_COMMUNITY)
Admission: RE | Admit: 2022-12-18 | Discharge: 2022-12-18 | Disposition: A | Discharge status: Home / Self Care | Payer: 59 | Source: Ambulatory Visit | Attending: Internal Medicine | Admitting: Internal Medicine

## 2022-12-18 ENCOUNTER — Encounter (HOSPITAL_COMMUNITY): Payer: Self-pay

## 2022-12-18 DIAGNOSIS — Z1231 Encounter for screening mammogram for malignant neoplasm of breast: Secondary | ICD-10-CM | POA: Diagnosis not present

## 2022-12-21 ENCOUNTER — Other Ambulatory Visit: Payer: Self-pay | Admitting: Orthopedic Surgery

## 2022-12-25 ENCOUNTER — Encounter: Payer: Self-pay | Admitting: Orthopedic Surgery

## 2022-12-25 ENCOUNTER — Other Ambulatory Visit (INDEPENDENT_AMBULATORY_CARE_PROVIDER_SITE_OTHER): Payer: 59

## 2022-12-25 ENCOUNTER — Ambulatory Visit (INDEPENDENT_AMBULATORY_CARE_PROVIDER_SITE_OTHER): Payer: 59 | Admitting: Orthopedic Surgery

## 2022-12-25 VITALS — BP 185/111 | HR 82 | Ht 63.0 in | Wt 229.0 lb

## 2022-12-25 DIAGNOSIS — M1712 Unilateral primary osteoarthritis, left knee: Secondary | ICD-10-CM

## 2022-12-25 DIAGNOSIS — S8002XA Contusion of left knee, initial encounter: Secondary | ICD-10-CM | POA: Diagnosis not present

## 2022-12-25 DIAGNOSIS — M25562 Pain in left knee: Secondary | ICD-10-CM

## 2022-12-25 DIAGNOSIS — G8929 Other chronic pain: Secondary | ICD-10-CM

## 2022-12-25 MED ORDER — METHYLPREDNISOLONE ACETATE 40 MG/ML IJ SUSP
40.0000 mg | Freq: Once | INTRAMUSCULAR | Status: AC
  Administered 2022-12-25: 40 mg via INTRA_ARTICULAR

## 2022-12-25 NOTE — Patient Instructions (Signed)

## 2022-12-25 NOTE — Progress Notes (Signed)
New problem  Chief complaint pain left knee  72 year old female had a right total knee had some complications had to have a repeat surgery for patellofemoral instability comes in with acute onset of pain 2 weeks ago after falling injuring her left knee complains of peripatellar pain laterally and medial joint line pain  She was able to walk on it she uses a cane chronically.  She says her right knee has been stable at present  Examination of the left knee shows peripatellar tenderness medial joint line tenderness ligaments stable in flexion and extension no effusion  X-rays show no fracture or dislocation just arthritis on the left knee grade 2/3 with some joint space narrowing  Impression contusion left knee  Assessment and plan     No acute fracture or ligamentous instability  Recommend injection for pain relief  Patient is already on a significant amount of opioid and that will probably not help  Procedure note left knee injection   verbal consent was obtained to inject left knee joint  Timeout was completed to confirm the site of injection  The medications used were depomedrol 40 mg and 1% lidocaine 3 cc Anesthesia was provided by ethyl chloride and the skin was prepped with alcohol.  After cleaning the skin with alcohol a 20-gauge needle was used to inject the left knee joint. There were no complications. A sterile bandage was applied.

## 2022-12-25 NOTE — Addendum Note (Signed)
Addended byCaffie Damme on: 12/25/2022 03:43 PM   Modules accepted: Orders

## 2022-12-27 ENCOUNTER — Other Ambulatory Visit: Payer: Self-pay | Admitting: Orthopedic Surgery

## 2023-01-04 ENCOUNTER — Other Ambulatory Visit: Payer: Self-pay | Admitting: Radiology

## 2023-01-04 ENCOUNTER — Telehealth: Payer: Self-pay | Admitting: Orthopedic Surgery

## 2023-01-04 MED ORDER — OXYCODONE-ACETAMINOPHEN 10-325 MG PO TABS: 1.0000 | ORAL_TABLET | ORAL | 0 refills | Status: DC | PRN

## 2023-01-04 NOTE — Telephone Encounter (Signed)
He sent it in this afternoon, I got the faxed request this afternoon, he sent it this afternoon, I can't call during clinic.

## 2023-01-04 NOTE — Telephone Encounter (Signed)
Refill request received via fax for Oxycodone [facility]

## 2023-01-04 NOTE — Telephone Encounter (Signed)
Patient called wanting to know if Dr. Romeo Apple was going to call her pain medicine in today.  She is out of her pain medicine and she states the pharmacy has already faxed our office twice today.   oxyCODONE-acetaminophen (PERCOCET) 10-325 MG tablet   Pharmacy: [facility]    Please call her back at [phone]

## 2023-01-11 ENCOUNTER — Other Ambulatory Visit: Payer: Self-pay | Admitting: Orthopedic Surgery

## 2023-01-15 ENCOUNTER — Ambulatory Visit: Payer: Self-pay | Admitting: *Deleted

## 2023-01-15 NOTE — Patient Outreach (Signed)
  Care Coordination   01/15/2023 Name: Diane [name] MRN: [id number] DOB: October 07, 1950   Care Coordination Outreach Attempts:  An unsuccessful telephone outreach was attempted for a scheduled appointment today.  Follow Up Plan:  Additional outreach attempts will be made to offer the patient care coordination information and services.   Encounter Outcome:  No Answer   Care Coordination Interventions:  No, not indicated    Demetrios Loll, BSN, RN-BC RN Care Coordinator [facility]  [facility] Direct Dial: [phone] Main #: [phone]

## 2023-01-18 ENCOUNTER — Other Ambulatory Visit: Payer: Self-pay | Admitting: Orthopedic Surgery

## 2023-01-18 ENCOUNTER — Telehealth: Payer: Self-pay

## 2023-01-18 ENCOUNTER — Other Ambulatory Visit: Payer: Self-pay | Admitting: Family

## 2023-01-18 NOTE — Telephone Encounter (Signed)
Dr. Romeo Apple pt-- left voicemail stating that she wanted to change her pharmacy to [facility].  Then 2 minutes later Donnie from [facility] left message for a return call about patient's medication.  His number is [phone]

## 2023-01-19 ENCOUNTER — Telehealth: Payer: Self-pay | Admitting: Orthopedic Surgery

## 2023-01-19 ENCOUNTER — Other Ambulatory Visit: Payer: Self-pay | Admitting: Orthopedic Surgery

## 2023-01-19 MED ORDER — OXYCODONE-ACETAMINOPHEN 10-325 MG PO TABS: 1.0000 | ORAL_TABLET | ORAL | 0 refills | Status: DC | PRN

## 2023-01-19 NOTE — Telephone Encounter (Signed)
[facility] called left voicemail for [place] to call them back at [phone]   concerning Diane [name]

## 2023-01-19 NOTE — Telephone Encounter (Signed)
Dr. Mort Sawyers pt - spoke w/the patient, [name] does not have her Oxycodone 10-325, 30 quantity, every 4hrs PRN, she would like it sent to [facility]

## 2023-01-19 NOTE — Telephone Encounter (Signed)
Duplicate message from yesterday  Please resend to [facility]

## 2023-01-19 NOTE — Telephone Encounter (Signed)
Called pharmacy back, they wanted to see if they could get a rx change for the pt since they do not currently have the 10-325's. Let pharmacist know pt was able to get them at another pharmacy at this time and they can cancel rx.

## 2023-01-22 ENCOUNTER — Ambulatory Visit (HOSPITAL_COMMUNITY): Payer: 59 | Admitting: Psychiatry

## 2023-01-24 ENCOUNTER — Ambulatory Visit: Payer: Self-pay | Admitting: *Deleted

## 2023-01-24 NOTE — Patient Outreach (Signed)
  Care Coordination   01/24/2023 Name: Diane [name] MRN: [id number] DOB: 05-07-1951   Care Coordination Outreach Attempts:  An unsuccessful telephone outreach was attempted for a scheduled appointment today.  Follow Up Plan:  No further outreach attempts will be made at this time. We have been unable to contact the patient to offer or enroll patient in care coordination services  Encounter Outcome:  No Answer   Care Coordination Interventions:  No, not indicated    Demetrios Loll, BSN, RN-BC RN Care Coordinator [facility]  [facility] Direct Dial: [phone] Main #: [phone]

## 2023-01-25 ENCOUNTER — Other Ambulatory Visit: Payer: Self-pay | Admitting: Orthopedic Surgery

## 2023-02-06 ENCOUNTER — Ambulatory Visit (INDEPENDENT_AMBULATORY_CARE_PROVIDER_SITE_OTHER): Payer: 59 | Admitting: Psychiatry

## 2023-02-06 ENCOUNTER — Encounter (HOSPITAL_COMMUNITY): Payer: Self-pay

## 2023-02-06 ENCOUNTER — Other Ambulatory Visit: Payer: Self-pay | Admitting: Orthopedic Surgery

## 2023-02-06 DIAGNOSIS — F3130 Bipolar disorder, current episode depressed, mild or moderate severity, unspecified: Secondary | ICD-10-CM | POA: Diagnosis not present
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
## 2023-02-08 ENCOUNTER — Ambulatory Visit (HOSPITAL_COMMUNITY): Payer: 59 | Admitting: Psychiatry

## 2023-02-08 DIAGNOSIS — E039 Hypothyroidism, unspecified: Secondary | ICD-10-CM | POA: Diagnosis not present

## 2023-02-09 DIAGNOSIS — E039 Hypothyroidism, unspecified: Secondary | ICD-10-CM | POA: Diagnosis not present

## 2023-02-09 DIAGNOSIS — K219 Gastro-esophageal reflux disease without esophagitis: Secondary | ICD-10-CM | POA: Diagnosis not present

## 2023-02-09 DIAGNOSIS — I10 Essential (primary) hypertension: Secondary | ICD-10-CM | POA: Diagnosis not present

## 2023-02-12 ENCOUNTER — Ambulatory Visit (INDEPENDENT_AMBULATORY_CARE_PROVIDER_SITE_OTHER): Payer: 59 | Admitting: Psychiatry

## 2023-02-12 ENCOUNTER — Encounter (HOSPITAL_COMMUNITY): Payer: Self-pay | Admitting: Psychiatry

## 2023-02-12 VITALS — BP 94/56 | HR 83 | Ht 63.0 in | Wt 229.4 lb

## 2023-02-12 DIAGNOSIS — F5105 Insomnia due to other mental disorder: Secondary | ICD-10-CM | POA: Diagnosis not present

## 2023-02-12 DIAGNOSIS — F3130 Bipolar disorder, current episode depressed, mild or moderate severity, unspecified: Secondary | ICD-10-CM | POA: Diagnosis not present

## 2023-02-12 MED ORDER — FLUOXETINE HCL 20 MG PO CAPS: 20.0000 mg | ORAL_CAPSULE | Freq: Every day | ORAL | 2 refills | Status: DC

## 2023-02-12 MED ORDER — TRAZODONE HCL 100 MG PO TABS
100.0000 mg | ORAL_TABLET | Freq: Every day | ORAL | 2 refills | Status: DC
Start: 2023-02-12 — End: 2023-04-16

## 2023-02-12 MED ORDER — ALPRAZOLAM 0.5 MG PO TABS: 0.5000 mg | ORAL_TABLET | Freq: Three times a day (TID) | ORAL | 2 refills | Status: DC

## 2023-02-12 MED ORDER — DIVALPROEX SODIUM ER 500 MG PO TB24: 500.0000 mg | ORAL_TABLET | Freq: Every day | ORAL | 0 refills | Status: DC

## 2023-02-12 NOTE — Progress Notes (Signed)
BH MD/PA/NP OP Progress Note  02/12/2023 2:12 PM RENAI RHYNER  MRN:  [id number]  Chief Complaint:  Chief Complaint  Patient presents with   Depression   Anxiety   Follow-up   HPI: This patient is a 72 year old black female who lives with her son in [place].  She is on disability.  The patient returns for follow-up after about 6 months regarding her mood swings depression and anxiety.  Since I last saw her she states that her ex-husband died in October 27, 2022 her brother died.  Her younger sister has been diagnosed with lung cancer.  She states that she has been more depressed.  The son who lives with her barely speaks to her and just stays to himself.  She sounds as if she is quite lonely.  She does not interact with too many people and I encouraged her to be more active at her church.  The patient states that she has had fleeting suicidal ideation but no plan.  She does not think the Cymbalta is working for her depression anymore so I suggested we make a change to Prozac.  She is still having a lot of foot pain and knee pain.  She is sleeping fairly well but had to increase the trazodone to 100 mg.  She denies use of drugs or alcohol Visit Diagnosis:    ICD-10-CM   1. Bipolar I disorder, most recent episode depressed (HCC)  F31.30     2. 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  At that time she was using drugs and alcohol.  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[facility])     Past Surgical History:  Procedure Laterality Date   BACK SURGERY     BILATERAL OOPHORECTOMY     CATARACT EXTRACTION W/PHACO Left 01/04/2016   Procedure: CATARACT EXTRACTION PHACO AND INTRAOCULAR LENS PLACEMENT (IOC);  Surgeon: Jethro Bolus, MD;  Location: [facility];  Service: Ophthalmology;  Laterality: Left;  CDE:4.70   CATARACT EXTRACTION W/PHACO Right 01/18/2016   Procedure: CATARACT EXTRACTION PHACO AND INTRAOCULAR LENS PLACEMENT RIGHT EYE; CDE:  4.66;  Surgeon: Jethro Bolus, MD;  Location: [facility];  Service: Ophthalmology;  Laterality: Right;   CHOLECYSTECTOMY     COLONOSCOPY  10/16/2008   YNW:[id number], otherwise normal rectum; pancolonic diverticula the remainder of the colonic mucosa appeared normal. Next TCS due 10-27-13.   COLONOSCOPY  06/28/2006   RMR: Internal hemorrhoids. Diminutive rectal polyps, cold biopsied/ Pancolonic diverticula. Polyps in the right colon removed    COLONOSCOPY WITH PROPOFOL N/A 05/20/2015   RMR: Pancolonic diverticulosis. Redundant colon    COLONOSCOPY WITH PROPOFOL  N/A 11/11/2020   Procedure: COLONOSCOPY WITH PROPOFOL;  Surgeon: Corbin Ade, MD;  [facility];  Service: Endoscopy;  Laterality: N/A;  pm ASA 2   ESOPHAGOGASTRODUODENOSCOPY  10/16/2008   OHY:WVPXT hiatal hernia otherwise normal esophagus, stomach  D1, D2, status post passage of a 56-French Maloney dilator   ESOPHAGOGASTRODUODENOSCOPY  06/28/2006   GGY:IRSWNI esophagus. Small hiatal hernia. Otherwise normal stomach, D1 and D2, status post passage of a 56 French Maloney dilator   ESOPHAGOGASTRODUODENOSCOPY (EGD) WITH ESOPHAGEAL DILATION N/A 07/14/2013   RMR: Abnormal esophagus of uncertain significance-status post esophageal biospy. small hiatal hernia.    ESOPHAGOGASTRODUODENOSCOPY (EGD)  WITH PROPOFOL N/A 05/20/2015   RMR: NORMAL EGD status post maloney dilation    ESOPHAGOGASTRODUODENOSCOPY (EGD) WITH PROPOFOL N/A 12/23/2018   Dr. Jena Gauss: normal esophagus, s/p esophageal dilation.    FOOT ARTHRODESIS Right 03/31/2022   Procedure: RIGHT FOOT TARSAL-METATARSAL FUSION;  Surgeon: Nadara Mustard, MD;  Location[facility];  Service: Orthopedics;  Laterality: Right;   FOOT SURGERY     left foot-   GASTROCNEMIUS RECESSION Right 03/31/2022   Procedure: RIGHT ACHILLES LENGTHENING;  Surgeon: Nadara Mustard, MD;  Location: [facility] OR;  Service: Orthopedics;  Laterality: Right;   INCISION AND DRAINAGE Right 01/03/2013   Procedure: INCISION AND DRAINAGE;  Surgeon: Vickki Hearing, MD;  Location: [facility];  Service: Orthopedics;  Laterality: Right;   KNEE ARTHROSCOPY  01/2012   right   KNEE ARTHROSCOPY WITH LATERAL RELEASE Right 01/03/2013   Procedure: Lateral Release Patella Right Knee;  Surgeon: Vickki Hearing, MD;  Location: [facility];  Service: Orthopedics;  Laterality: Right;   LAPAROSCOPIC LYSIS OF ADHESIONS N/A 11/12/2019   Procedure: LAPAROSCOPIC LYSIS OF ADHESIONS, extensive; drainage of peritoneal cyst;  Surgeon: Lazaro Arms, MD;  Location: [facility];  Service: Gynecology;  Laterality: N/A;   LUMBAR DISC SURGERY  L4-5   MALONEY DILATION N/A 05/20/2015   Procedure: Elease Hashimoto DILATION;  Surgeon: Corbin Ade, MD;  Location: [facility];  Service: Endoscopy;  Laterality: N/AElease Hashimoto dilator # 54   MALONEY DILATION N/A 12/23/2018   Procedure: Elease Hashimoto DILATION;  Surgeon: Corbin Ade, MD;  [facility];  Service: Endoscopy;  Laterality: N/A;   PARTIAL HYSTERECTOMY     TOTAL KNEE ARTHROPLASTY  07/01/2012   Procedure: TOTAL KNEE ARTHROPLASTY;  Surgeon: Vickki Hearing, MD;  Location: [facility];  Service: Orthopedics;  Laterality: Right;   TOTAL KNEE REVISION Right 01/03/2013   Procedure: Patellaplasty Right Knee;  Surgeon: Vickki Hearing, MD;  Location: [facility];  Service: Orthopedics;   Laterality: Right;    Family Psychiatric History: See below  Family History:  Family History  Problem Relation Age of Onset   Alcohol abuse Father    Anxiety disorder Father    Alcohol abuse Mother    Anxiety disorder Mother    Alcohol abuse Brother    Anxiety disorder Brother    Drug abuse Brother    Drug abuse Brother    Alcohol abuse Brother    Anxiety disorder Brother    Seizures Brother    Alcohol abuse Brother    Anxiety disorder Brother    Seizures Brother    Alcohol abuse Brother    Anxiety disorder Brother    Alcohol abuse Brother    Anxiety disorder Brother    Breast cancer Sister    Bipolar disorder Sister    Breast cancer Sister    Dementia Paternal Aunt  ADD / ADHD Grandchild    ADD / ADHD Grandchild    Heart disease Other    Diabetes Other    Alcohol abuse Other    Thyroid disease Son    Thyroid disease Daughter    Depression Neg Hx    OCD Neg Hx    Paranoid behavior Neg Hx    Schizophrenia Neg Hx    Sexual abuse Neg Hx    Physical abuse Neg Hx    Colon cancer Neg Hx     Social History:  Social History   Socioeconomic History   Marital status: Legally Separated    Spouse name: Not on file   Number of children: Not on file   Years of education: 12th grade   Highest education level: Not on file  Occupational History   Occupation: disabled    Employer: UNEMPLOYED  Tobacco Use   Smoking status: Former    Packs/day: 0.25    Years: 5.00    Additional pack years: 0.00    Total pack years: 1.25    Types: Cigarettes    Quit date: 06/26/2003    Years since quitting: 19.6   Smokeless tobacco: Never  Vaping Use   Vaping Use: Never used  Substance and Sexual Activity   Alcohol use: Not Currently    Comment: occasionally; once a month.   Drug use: Not Currently    Comment: has a past history of street drug use   Sexual activity: Yes    Birth control/protection: Surgical    Comment: hyst  Other Topics Concern   Not on file  Social  History Narrative   Not on file   Social Determinants of Health   Financial Resource Strain: Low Risk  (12/13/2022)   Overall Financial Resource Strain (CARDIA)    Difficulty of Paying Living Expenses: Not very hard  Food Insecurity: No Food Insecurity (09/15/2022)   Hunger Vital Sign    Worried About Running Out of Food in the Last Year: Never true    Ran Out of Food in the Last Year: Never true  Transportation Needs: No Transportation Needs (12/13/2022)   PRAPARE - Administrator, Civil Service (Medical): No    Lack of Transportation (Non-Medical): No  Physical Activity: Insufficiently Active (09/15/2022)   Exercise Vital Sign    Days of Exercise per Week: 2 days    Minutes of Exercise per Session: 60 min  Stress: No Stress Concern Present (01/24/2022)   [employer] of Occupational Health - Occupational Stress Questionnaire    Feeling of Stress : Only a little  Social Connections: Socially Isolated (01/24/2022)   Social Connection and Isolation Panel [NHANES]    Frequency of Communication with Friends and Family: Once a week    Frequency of Social Gatherings with Friends and Family: Once a week    Attends Religious Services: 1 to 4 times per year    Active Member of [employer] or Organizations: No    Attends Banker Meetings: Never    Marital Status: Separated    Allergies:  Allergies  Allergen Reactions   Bacitra-Neomycin-Polymyxin-Hc     Other Reaction(s): Not available   Neomycin-Bacitracin Zn-Polymyx Other (See Comments)    Reaction: skin starts to become raw and peels off   Penicillins Hives   Sulfonamide Derivatives Hives and Swelling    Metabolic Disorder Labs: Lab Results  Component Value Date   HGBA1C 5.5 02/19/2020   MPG 111.15 02/19/2020   MPG 120 (H) 01/25/2012  No results found for: "PROLACTIN" Lab Results  Component Value Date   CHOL  12/10/2010    69        ATP III CLASSIFICATION:  <200     mg/dL   Desirable  782-956  mg/dL    Borderline High  >=213    mg/dL   High          TRIG 38 12/10/2010   HDL 28 (L) 12/10/2010   CHOLHDL 2.5 12/10/2010   VLDL 8 12/10/2010   LDLCALC  12/10/2010    33        Total Cholesterol/HDL:CHD Risk Coronary Heart Disease Risk Table                     Men   Women  1/2 Average Risk   3.4   3.3  Average Risk       5.0   4.4  2 X Average Risk   9.6   7.1  3 X Average Risk  23.4   11.0        Use the calculated Patient Ratio above and the CHD Risk Table to determine the patient's CHD Risk.        ATP III CLASSIFICATION (LDL):  <100     mg/dL   Optimal  086-578  mg/dL   Near or Above                    Optimal  130-159  mg/dL   Borderline  469-629  mg/dL   High  >528     mg/dL   Very High   LDLCALC 71 07/12/2009   Lab Results  Component Value Date   TSH 7.105 (H) 02/18/2020   TSH 0.52 02/11/2018    Therapeutic Level Labs: No results found for: "LITHIUM" Lab Results  Component Value Date   VALPROATE 38 (L) 08/10/2022   VALPROATE 70.7 09/24/2018   No results found for: "CBMZ"  Current Medications: Current Outpatient Medications  Medication Sig Dispense Refill   FLUoxetine (PROZAC) 20 MG capsule Take 1 capsule (20 mg total) by mouth daily. 30 capsule 2   traZODone (DESYREL) 100 MG tablet Take 1 tablet (100 mg total) by mouth at bedtime. 30 tablet 2   ALPRAZolam (XANAX) 0.5 MG tablet Take 1 tablet (0.5 mg total) by mouth 3 (three) times daily. 90 tablet 2   atorvastatin (LIPITOR) 20 MG tablet Take 20 mg by mouth daily.     cyclobenzaprine (FLEXERIL) 10 MG tablet Take 10 mg by mouth 3 (three) times daily as needed.     divalproex (DEPAKOTE ER) 500 MG 24 hr tablet Take 1 tablet (500 mg total) by mouth at bedtime. 30 tablet 0   hydrocortisone (ANUSOL-HC) 2.5 % rectal cream Place 1 application rectally 2 (two) times daily. 30 g 1   IBU 800 MG tablet TAKE ONE TABLET BY MOUTH EVERY 8 HOURS AS NEEDED 90 tablet 1   levothyroxine (SYNTHROID) 100 MCG tablet Take 100 mcg by  mouth daily before breakfast.     lisinopril (PRINIVIL,ZESTRIL) 10 MG tablet Take 10 mg by mouth at bedtime.      lubiprostone (AMITIZA) 24 MCG capsule TAKE ONE CAPSULE BY MOUTH TWICE DAILY WITH A MEAL 60 capsule 11   meloxicam (MOBIC) 7.5 MG tablet TAKE ONE TABLET BY MOUTH ONCE DAILY 30 tablet 5   Naldemedine Tosylate (SYMPROIC) 0.2 MG TABS Take 0.2 mg by mouth daily.     omeprazole (PRILOSEC) 20 MG capsule TAKE  ONE CAPSULE BY MOUTH TWICE DAILY BEFORE A MEAL. 180 capsule 1   oxybutynin (DITROPAN-XL) 10 MG 24 hr tablet Take 10 mg by mouth at bedtime.     oxyCODONE-acetaminophen (PERCOCET) 10-325 MG tablet TAKE 1 TABLET BY MOUTH EVERY 4 HOURS AS NEEDED FOR PAIN 30 tablet 0   No current facility-administered medications for this visit.     Musculoskeletal: Strength & Muscle Tone: decreased Gait & Station: unsteady Patient leans: N/A  Psychiatric Specialty Exam: Review of Systems  Musculoskeletal:  Positive for arthralgias, gait problem and joint swelling.  Psychiatric/Behavioral:  Positive for dysphoric mood. The patient is nervous/anxious.   All other systems reviewed and are negative.   Blood pressure (!) 94/56, pulse 83, height 5\' 3"  (1.6 m), weight 229 lb 6.4 oz (104.1 kg), SpO2 94 %.Body mass index is 40.64 kg/m.  General Appearance: Casual, Neat, and Well Groomed  Eye Contact:  Good  Speech:  Clear and Coherent  Volume:  Decreased  Mood:  Depressed  Affect:  Flat  Thought Process:  Goal Directed  Orientation:  Full (Time, Place, and Person)  Thought Content: Rumination   Suicidal Thoughts:  No  Homicidal Thoughts:  No  Memory:  Immediate;   Good Recent;   Good Remote;   Fair  Judgement:  Good  Insight:  Fair  Psychomotor Activity:  Decreased  Concentration:  Concentration: Fair and Attention Span: Fair  Recall:  [employer] of Knowledge: Fair  Language: Good  Akathisia:  No  Handed:  Right  AIMS (if indicated): not done  Assets:  Communication Skills Desire for  Improvement Resilience Social Support Talents/Skills  ADL's:  Intact  Cognition: WNL  Sleep:  Fair   Screenings: GAD-7    Flowsheet Row Office Visit from 01/24/2022 in [facility]  Total GAD-7 Score 16      PHQ2-9    Flowsheet Row Counselor from 02/06/2023 in [facility] [place] Counselor from 11/01/2022 in [facility] at [place] Video Visit from 09/05/2022 in [facility] at [place] Video Visit from 06/01/2022 in [facility] at [place] Video Visit from 02/28/2022 in [facility] at [facility] Total Score 2 3 6 1 1   PHQ-9 Total Score 15 10 16  -- --      Flowsheet Row Counselor from 11/01/2022 in [facility] at [place] Video Visit from 09/05/2022 in [facility] at [place] ED to Hosp-Admission (Discharged) from 08/10/2022 in [place] [facility]  C-SSRS RISK CATEGORY No Risk Error: Q3, 4, or 5 should not be populated when Q2 is No No Risk        Assessment and Plan: This patient is a 72 year old female with a history of depression anxiety chronic pain and a remote history of substance abuse.  She is particularly grieving the loss of her ex-husband in particular her brother.  I suggested a grief support program.  For now however we will discontinue Cymbalta in favor of Prozac 20 mg daily for depression.  She will continue Depakote DR 500 mg daily for mood stabilization, Xanax 0.5 mg 3 times daily for anxiety and increase trazodone to 100 mg at bedtime for sleep.  She will return to see me in 4 weeks  Collaboration of Care: Collaboration of Care: Referral or follow-up with counselor/therapist AEB patient will continue therapy with Florencia Reasons in our  office  Patient/Guardian was advised Release of Information  must be obtained prior to any record release in order to collaborate their care with an outside provider. Patient/Guardian was advised if they have not already done so to contact the registration department to sign all necessary forms in order for [place] to release information regarding their care.   Consent: Patient/Guardian gives verbal consent for treatment and assignment of benefits for services provided during this visit. Patient/Guardian expressed understanding and agreed to proceed.    [name], MD 02/12/2023, 2:12 PM

## 2023-02-13 ENCOUNTER — Other Ambulatory Visit: Payer: Self-pay | Admitting: Orthopedic Surgery

## 2023-02-14 ENCOUNTER — Ambulatory Visit: Payer: Self-pay | Admitting: *Deleted

## 2023-02-14 NOTE — Patient Outreach (Signed)
  Care Coordination   02/14/2023 Name: SHERL NEWBURG MRN: [id number] DOB: [birth year]   Care Coordination Outreach Attempts:  An unsuccessful telephone outreach was attempted for a scheduled appointment today. 3 consecutive unsuccessful telephone follow-up attempts have been made. RNCC removed from care team and goals have been closed.   Follow Up Plan:  No further outreach attempts will be made at this time. We have been unable to contact the patient to offer or enroll patient in care coordination services  Encounter Outcome:  No Answer   Care Coordination Interventions:  No, not indicated    Demetrios Loll, BSN, RN-BC RN Care Coordinator [facility]  [facility] Direct Dial: [phone] Main #: [phone]

## 2023-02-22 ENCOUNTER — Other Ambulatory Visit: Payer: Self-pay | Admitting: Orthopedic Surgery

## 2023-03-01 ENCOUNTER — Other Ambulatory Visit: Payer: Self-pay | Admitting: Orthopedic Surgery

## 2023-03-08 ENCOUNTER — Other Ambulatory Visit: Payer: Self-pay | Admitting: Orthopedic Surgery

## 2023-03-12 ENCOUNTER — Telehealth: Payer: Self-pay | Admitting: Orthopedic Surgery

## 2023-03-12 NOTE — Telephone Encounter (Signed)
Dr. Mort Sawyers pt - pt lvm stating that she would like to speak to Dr. Romeo Apple about why she hasn't gotten her pain medication.  If a clinical person could please call her back.  [phone]

## 2023-03-13 ENCOUNTER — Ambulatory Visit (HOSPITAL_COMMUNITY): Payer: 59 | Admitting: Psychiatry

## 2023-03-17 ENCOUNTER — Other Ambulatory Visit: Payer: Self-pay | Admitting: Orthopedic Surgery

## 2023-03-21 ENCOUNTER — Other Ambulatory Visit: Payer: Self-pay | Admitting: Orthopedic Surgery

## 2023-03-28 ENCOUNTER — Other Ambulatory Visit: Payer: Self-pay | Admitting: Orthopedic Surgery

## 2023-03-29 ENCOUNTER — Ambulatory Visit (HOSPITAL_COMMUNITY): Payer: 59 | Admitting: Psychiatry

## 2023-04-05 ENCOUNTER — Other Ambulatory Visit: Payer: Self-pay | Admitting: Orthopedic Surgery

## 2023-04-05 ENCOUNTER — Other Ambulatory Visit: Payer: Self-pay | Admitting: Gastroenterology

## 2023-04-05 MED ORDER — LACTULOSE 10 GM/15ML PO SOLN: 20.0000 g | Freq: Two times a day (BID) | ORAL | 0 refills | Status: DC

## 2023-04-12 ENCOUNTER — Telehealth (HOSPITAL_COMMUNITY): Payer: Self-pay | Admitting: Psychiatry

## 2023-04-12 ENCOUNTER — Ambulatory Visit (HOSPITAL_COMMUNITY): Payer: 59 | Admitting: Psychiatry

## 2023-04-12 NOTE — Telephone Encounter (Signed)
Therapist called patient regarding scheduled in office appointment.  Patient reported being unable to keep appointment as she forgot about the time.  Therapist informed patient this would be considered a no-show appointment.  Therapist also reminded patient of attendance/no-show policy.  Patient agrees to keep upcoming scheduled appointment on 04/26/2023.

## 2023-04-16 ENCOUNTER — Other Ambulatory Visit (HOSPITAL_COMMUNITY): Payer: Self-pay | Admitting: Psychiatry

## 2023-04-16 ENCOUNTER — Other Ambulatory Visit: Payer: Self-pay | Admitting: Orthopedic Surgery

## 2023-04-16 NOTE — Telephone Encounter (Signed)
Call for appt

## 2023-04-24 ENCOUNTER — Other Ambulatory Visit: Payer: Self-pay | Admitting: Family

## 2023-04-25 ENCOUNTER — Other Ambulatory Visit: Payer: Self-pay | Admitting: Orthopedic Surgery

## 2023-04-25 NOTE — Telephone Encounter (Signed)
Dr. Dallas Schimke pt - pt is requesting a refill on Oxycodone 10-325 to be sent to [facility].

## 2023-04-26 ENCOUNTER — Ambulatory Visit (INDEPENDENT_AMBULATORY_CARE_PROVIDER_SITE_OTHER): Payer: 59 | Admitting: Psychiatry

## 2023-04-26 DIAGNOSIS — F3130 Bipolar disorder, current episode depressed, mild or moderate severity, unspecified: Secondary | ICD-10-CM | POA: Diagnosis not present

## 2023-04-26 MED ORDER — OXYCODONE-ACETAMINOPHEN 10-325 MG PO TABS: 1.0000 | ORAL_TABLET | ORAL | 0 refills | Status: DC | PRN
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
## 2023-05-02 ENCOUNTER — Other Ambulatory Visit: Payer: Self-pay | Admitting: Orthopedic Surgery

## 2023-05-07 ENCOUNTER — Other Ambulatory Visit (HOSPITAL_COMMUNITY): Payer: Self-pay | Admitting: Psychiatry

## 2023-05-07 DIAGNOSIS — F5105 Insomnia due to other mental disorder: Secondary | ICD-10-CM

## 2023-05-07 NOTE — Telephone Encounter (Signed)
Call for appt

## 2023-05-10 ENCOUNTER — Telehealth (HOSPITAL_COMMUNITY): Payer: Self-pay | Admitting: Psychiatry

## 2023-05-10 ENCOUNTER — Ambulatory Visit (HOSPITAL_COMMUNITY): Payer: 59 | Admitting: Psychiatry

## 2023-05-10 NOTE — Telephone Encounter (Signed)
Therapist attempted to contact pt via phone regarding scheduled in office appointment and received voice mail recording indicating voice mailbox has not been established.

## 2023-05-12 DIAGNOSIS — I10 Essential (primary) hypertension: Secondary | ICD-10-CM | POA: Diagnosis not present

## 2023-05-12 DIAGNOSIS — E039 Hypothyroidism, unspecified: Secondary | ICD-10-CM | POA: Diagnosis not present

## 2023-05-16 ENCOUNTER — Other Ambulatory Visit: Payer: Self-pay | Admitting: Orthopedic Surgery

## 2023-05-17 ENCOUNTER — Other Ambulatory Visit (HOSPITAL_COMMUNITY): Payer: Self-pay | Admitting: Psychiatry

## 2023-05-21 ENCOUNTER — Encounter (HOSPITAL_COMMUNITY): Payer: Self-pay | Admitting: Psychiatry

## 2023-05-21 ENCOUNTER — Ambulatory Visit (INDEPENDENT_AMBULATORY_CARE_PROVIDER_SITE_OTHER): Payer: 59 | Admitting: Psychiatry

## 2023-05-21 VITALS — BP 112/67 | HR 76 | Ht 63.0 in | Wt 223.0 lb

## 2023-05-21 DIAGNOSIS — F3130 Bipolar disorder, current episode depressed, mild or moderate severity, unspecified: Secondary | ICD-10-CM | POA: Diagnosis not present

## 2023-05-21 DIAGNOSIS — F5105 Insomnia due to other mental disorder: Secondary | ICD-10-CM

## 2023-05-21 MED ORDER — ALPRAZOLAM 0.5 MG PO TABS: 0.5000 mg | ORAL_TABLET | Freq: Three times a day (TID) | ORAL | 2 refills | Status: DC

## 2023-05-21 MED ORDER — DIVALPROEX SODIUM ER 500 MG PO TB24: 500.0000 mg | ORAL_TABLET | Freq: Every day | ORAL | 2 refills | Status: DC

## 2023-05-21 MED ORDER — TRAZODONE HCL 100 MG PO TABS: 100.0000 mg | ORAL_TABLET | Freq: Every day | ORAL | 2 refills | Status: AC

## 2023-05-21 MED ORDER — BUPROPION HCL ER (XL) 150 MG PO TB24: 150.0000 mg | ORAL_TABLET | ORAL | 2 refills | Status: DC

## 2023-05-21 NOTE — Progress Notes (Signed)
BH MD/PA/NP OP Progress Note  05/21/2023 1:23 PM Diane [name]  MRN:  [id number]  Chief Complaint:  Chief Complaint  Patient presents with   Follow-up   Depression   Anxiety   HPI: This patient is a 72 year old black female who lives with her son in [place].  She is on disability.  The patient returns for follow-up after 3 months.  Last time she seemed to be more depressed particular because several family members and either died or were diagnosed with cancer.  This is still going on.  We switched her Cymbalta to Prozac but she still does not feel much better.  She has ongoing conflicts with both of her children.  She states that she sometimes sleeps too much because her energy is low.  I suggested perhaps a change to Wellbutrin which might help more with energy and she wants to try it.  She denies any thoughts of self-harm or suicide but does feel somewhat depressed and not much interest in doing anything.  She continues to have pain from her back down to her leg Visit Diagnosis:    ICD-10-CM   1. Bipolar I disorder, most recent episode depressed (HCC)  F31.30     2. 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  At that time she was using drugs and alcohol.  She has been in outpatient care ever since   Past Medical History:  Past Medical History:  Diagnosis Date   Allergic rhinitis    Anxiety    Arthritis    Cataracts, bilateral    Chronic back pain    Sees pain mamagement clinic   Chronic pain syndrome    Constipation    Depression    Essential hypertension    GERD (gastroesophageal reflux disease)    Headaches, cluster    History of seizures    Hypothyroid    Mania (HCC)    Mixed hyperlipidemia    PTSD (post-traumatic stress disorder)    Right lumbar radiculopathy    Sleep apnea    STOP BANG score=4    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[facility])     Past Surgical History:  Procedure Laterality Date   BACK SURGERY     BILATERAL OOPHORECTOMY     CATARACT EXTRACTION W/PHACO Left 01/04/2016   Procedure: CATARACT EXTRACTION PHACO AND INTRAOCULAR LENS PLACEMENT (IOC);  Surgeon: Jethro Bolus, MD;  Location: [facility];  Service: Ophthalmology;  Laterality: Left;  CDE:4.70   CATARACT EXTRACTION W/PHACO Right 01/18/2016   Procedure: CATARACT EXTRACTION PHACO AND INTRAOCULAR LENS PLACEMENT RIGHT EYE; CDE:  4.66;  Surgeon: Jethro Bolus, MD;  Location: [facility];  Service: Ophthalmology;  Laterality: Right;   CHOLECYSTECTOMY     COLONOSCOPY  10/16/2008   WUJ:[id number], otherwise normal rectum; pancolonic diverticula the remainder of the colonic mucosa appeared normal. Next TCS due 10/2013.   COLONOSCOPY  06/28/2006   RMR: Internal hemorrhoids. Diminutive rectal polyps, cold biopsied/ Pancolonic diverticula. Polyps in the right colon removed    COLONOSCOPY WITH PROPOFOL N/A 05/20/2015   RMR: Pancolonic diverticulosis. Redundant colon    COLONOSCOPY WITH PROPOFOL N/A 11/11/2020   Procedure: COLONOSCOPY WITH PROPOFOL;  Surgeon: Corbin Ade, MD;  [facility];  Service: Endoscopy;  Laterality: N/A;  pm ASA 2   ESOPHAGOGASTRODUODENOSCOPY  10/16/2008   AOZ:HYQMV hiatal hernia  otherwise normal esophagus, stomach  D1, D2, status post passage of a 56-French Maloney dilator   ESOPHAGOGASTRODUODENOSCOPY  06/28/2006   QIO:NGEXBM esophagus. Small hiatal hernia. Otherwise normal stomach, D1 and D2, status post passage of a 56 French Maloney dilator   ESOPHAGOGASTRODUODENOSCOPY (EGD) WITH ESOPHAGEAL DILATION N/A 07/14/2013   RMR: Abnormal esophagus of uncertain significance-status post esophageal biospy. small hiatal hernia.    ESOPHAGOGASTRODUODENOSCOPY (EGD) WITH PROPOFOL N/A 05/20/2015   RMR: NORMAL EGD status post maloney dilation    ESOPHAGOGASTRODUODENOSCOPY (EGD) WITH PROPOFOL N/A 12/23/2018   Dr. Jena Gauss: normal  esophagus, s/p esophageal dilation.    FOOT ARTHRODESIS Right 03/31/2022   Procedure: RIGHT FOOT TARSAL-METATARSAL FUSION;  Surgeon: Nadara Mustard, MD;  Location: [facility] OR;  Service: Orthopedics;  Laterality: Right;   FOOT SURGERY     left foot-   GASTROCNEMIUS RECESSION Right 03/31/2022   Procedure: RIGHT ACHILLES LENGTHENING;  Surgeon: Nadara Mustard, MD;  Location: [facility] OR;  Service: Orthopedics;  Laterality: Right;   INCISION AND DRAINAGE Right 01/03/2013   Procedure: INCISION AND DRAINAGE;  Surgeon: Vickki Hearing, MD;  Location: [facility];  Service: Orthopedics;  Laterality: Right;   KNEE ARTHROSCOPY  01/2012   right   KNEE ARTHROSCOPY WITH LATERAL RELEASE Right 01/03/2013   Procedure: Lateral Release Patella Right Knee;  Surgeon: Vickki Hearing, MD;  Location: [facility];  Service: Orthopedics;  Laterality: Right;   LAPAROSCOPIC LYSIS OF ADHESIONS N/A 11/12/2019   Procedure: LAPAROSCOPIC LYSIS OF ADHESIONS, extensive; drainage of peritoneal cyst;  Surgeon: Lazaro Arms, MD;  Location: [facility];  Service: Gynecology;  Laterality: N/A;   LUMBAR DISC SURGERY  L4-5   MALONEY DILATION N/A 05/20/2015   Procedure: Elease Hashimoto DILATION;  Surgeon: Corbin Ade, MD;  Location: [facility];  Service: Endoscopy;  Laterality: N/AElease Hashimoto dilator # 54   MALONEY DILATION N/A 12/23/2018   Procedure: Elease Hashimoto DILATION;  Surgeon: Corbin Ade, MD;  [facility];  Service: Endoscopy;  Laterality: N/A;   PARTIAL HYSTERECTOMY     TOTAL KNEE ARTHROPLASTY  07/01/2012   Procedure: TOTAL KNEE ARTHROPLASTY;  Surgeon: Vickki Hearing, MD;  Location: [facility];  Service: Orthopedics;  Laterality: Right;   TOTAL KNEE REVISION Right 01/03/2013   Procedure: Patellaplasty Right Knee;  Surgeon: Vickki Hearing, MD;  Location: [facility];  Service: Orthopedics;  Laterality: Right;    Family Psychiatric History: See below  Family History:  Family History  Problem Relation Age of Onset   Alcohol abuse Father    Anxiety  disorder Father    Alcohol abuse Mother    Anxiety disorder Mother    Alcohol abuse Brother    Anxiety disorder Brother    Drug abuse Brother    Drug abuse Brother    Alcohol abuse Brother    Anxiety disorder Brother    Seizures Brother    Alcohol abuse Brother    Anxiety disorder Brother    Seizures Brother    Alcohol abuse Brother    Anxiety disorder Brother    Alcohol abuse Brother    Anxiety disorder Brother    Breast cancer Sister    Bipolar disorder Sister    Breast cancer Sister    Dementia Paternal Aunt    ADD / ADHD Grandchild    ADD / ADHD Grandchild    Heart disease Other    Diabetes Other    Alcohol abuse Other    Thyroid disease Son    Thyroid disease  Daughter    Depression Neg Hx    OCD Neg Hx    Paranoid behavior Neg Hx    Schizophrenia Neg Hx    Sexual abuse Neg Hx    Physical abuse Neg Hx    Colon cancer Neg Hx     Social History:  Social History   Socioeconomic History   Marital status: Legally Separated    Spouse name: Not on file   Number of children: Not on file   Years of education: 12th grade   Highest education level: Not on file  Occupational History   Occupation: disabled    Employer: UNEMPLOYED  Tobacco Use   Smoking status: Former    Current packs/day: 0.00    Average packs/day: 0.3 packs/day for 5.0 years (1.3 ttl pk-yrs)    Types: Cigarettes    Start date: 06/25/1998    Quit date: 06/26/2003    Years since quitting: 19.9   Smokeless tobacco: Never  Vaping Use   Vaping status: Never Used  Substance and Sexual Activity   Alcohol use: Not Currently    Comment: occasionally; once a month.   Drug use: Not Currently    Comment: has a past history of street drug use   Sexual activity: Yes    Birth control/protection: Surgical    Comment: hyst  Other Topics Concern   Not on file  Social History Narrative   Not on file   Social Determinants of Health   Financial Resource Strain: Low Risk  (12/13/2022)   Overall Financial  Resource Strain (CARDIA)    Difficulty of Paying Living Expenses: Not very hard  Food Insecurity: No Food Insecurity (09/15/2022)   Hunger Vital Sign    Worried About Running Out of Food in the Last Year: Never true    Ran Out of Food in the Last Year: Never true  Transportation Needs: No Transportation Needs (12/13/2022)   PRAPARE - Administrator, Civil Service (Medical): No    Lack of Transportation (Non-Medical): No  Physical Activity: Insufficiently Active (09/15/2022)   Exercise Vital Sign    Days of Exercise per Week: 2 days    Minutes of Exercise per Session: 60 min  Stress: No Stress Concern Present (01/24/2022)   [employer] of Occupational Health - Occupational Stress Questionnaire    Feeling of Stress : Only a little  Social Connections: Socially Isolated (01/24/2022)   Social Connection and Isolation Panel [NHANES]    Frequency of Communication with Friends and Family: Once a week    Frequency of Social Gatherings with Friends and Family: Once a week    Attends Religious Services: 1 to 4 times per year    Active Member of [employer] or Organizations: No    Attends Banker Meetings: Never    Marital Status: Separated    Allergies:  Allergies  Allergen Reactions   Bacitra-Neomycin-Polymyxin-Hc     Other Reaction(s): Not available   Neomycin-Bacitracin Zn-Polymyx Other (See Comments)    Reaction: skin starts to become raw and peels off   Penicillins Hives   Sulfonamide Derivatives Hives and Swelling    Metabolic Disorder Labs: Lab Results  Component Value Date   HGBA1C 5.5 02/19/2020   MPG 111.15 02/19/2020   MPG 120 (H) 01/25/2012   No results found for: "PROLACTIN" Lab Results  Component Value Date   CHOL  12/10/2010    69        ATP III CLASSIFICATION:  <200  mg/dL   Desirable  528-413  mg/dL   Borderline High  >=244    mg/dL   High          TRIG 38 12/10/2010   HDL 28 (L) 12/10/2010   CHOLHDL 2.5 12/10/2010   VLDL 8  12/10/2010   LDLCALC  12/10/2010    33        Total Cholesterol/HDL:CHD Risk Coronary Heart Disease Risk Table                     Men   Women  1/2 Average Risk   3.4   3.3  Average Risk       5.0   4.4  2 X Average Risk   9.6   7.1  3 X Average Risk  23.4   11.0        Use the calculated Patient Ratio above and the CHD Risk Table to determine the patient's CHD Risk.        ATP III CLASSIFICATION (LDL):  <100     mg/dL   Optimal  010-272  mg/dL   Near or Above                    Optimal  130-159  mg/dL   Borderline  536-644  mg/dL   High  >034     mg/dL   Very High   LDLCALC 71 07/12/2009   Lab Results  Component Value Date   TSH 7.105 (H) 02/18/2020   TSH 0.52 02/11/2018    Therapeutic Level Labs: No results found for: "LITHIUM" Lab Results  Component Value Date   VALPROATE 38 (L) 08/10/2022   VALPROATE 70.7 09/24/2018   No results found for: "CBMZ"  Current Medications: Current Outpatient Medications  Medication Sig Dispense Refill   atorvastatin (LIPITOR) 20 MG tablet Take 20 mg by mouth daily.     buPROPion (WELLBUTRIN XL) 150 MG 24 hr tablet Take 1 tablet (150 mg total) by mouth every morning. 30 tablet 2   cyclobenzaprine (FLEXERIL) 10 MG tablet TAKE 1 TABLET BY MOUTH THREE TIMES DAILY AS NEEDED FOR MUSCLE SPASMS 180 tablet 0   hydrocortisone (ANUSOL-HC) 2.5 % rectal cream Place 1 application rectally 2 (two) times daily. 30 g 1   IBU 800 MG tablet TAKE ONE TABLET BY MOUTH EVERY 8 HOURS AS NEEDED 90 tablet 1   lactulose (CHRONULAC) 10 GM/15ML solution Take 30 mLs (20 g total) by mouth 2 (two) times daily. 236 mL 0   levothyroxine (SYNTHROID) 100 MCG tablet Take 100 mcg by mouth daily before breakfast.     lisinopril (PRINIVIL,ZESTRIL) 10 MG tablet Take 10 mg by mouth at bedtime.      lubiprostone (AMITIZA) 24 MCG capsule TAKE ONE CAPSULE BY MOUTH TWICE DAILY WITH A MEAL 60 capsule 11   meloxicam (MOBIC) 7.5 MG tablet TAKE ONE TABLET BY MOUTH ONCE DAILY 30  tablet 5   Naldemedine Tosylate (SYMPROIC) 0.2 MG TABS Take 0.2 mg by mouth daily.     omeprazole (PRILOSEC) 20 MG capsule TAKE ONE CAPSULE BY MOUTH TWICE DAILY BEFORE A MEAL. 180 capsule 1   oxybutynin (DITROPAN-XL) 10 MG 24 hr tablet Take 10 mg by mouth at bedtime.     oxyCODONE-acetaminophen (PERCOCET) 10-325 MG tablet TAKE 1 TABLET BY MOUTH EVERY 4 HOURS AS NEEDED FOR PAIN 30 tablet 0   ALPRAZolam (XANAX) 0.5 MG tablet Take 1 tablet (0.5 mg total) by mouth 3 (three) times daily. 90  tablet 2   divalproex (DEPAKOTE ER) 500 MG 24 hr tablet Take 1 tablet (500 mg total) by mouth at bedtime. 30 tablet 2   traZODone (DESYREL) 100 MG tablet Take 1 tablet (100 mg total) by mouth at bedtime. 30 tablet 2   No current facility-administered medications for this visit.     Musculoskeletal: Strength & Muscle Tone: within normal limits Gait & Station: normal Patient leans: N/A  Psychiatric Specialty Exam: Review of Systems  Musculoskeletal:  Positive for arthralgias and back pain.  Psychiatric/Behavioral:  Positive for dysphoric mood.   All other systems reviewed and are negative.   Blood pressure 112/67, pulse 76, height 5\' 3"  (1.6 m), weight 223 lb (101.2 kg), SpO2 93%.Body mass index is 39.5 kg/m.  General Appearance: Casual and Fairly Groomed  Eye Contact:  Good  Speech:  Clear and Coherent  Volume:  Normal  Mood:  Dysphoric  Affect:  Flat  Thought Process:  Goal Directed  Orientation:  Full (Time, Place, and Person)  Thought Content: Rumination   Suicidal Thoughts:  No  Homicidal Thoughts:  No  Memory:  Immediate;   Good Recent;   Fair Remote;   NA  Judgement:  Good  Insight:  Fair  Psychomotor Activity:  Normal  Concentration:  Concentration: Fair and Attention Span: Fair  Recall:  Good  Fund of Knowledge: Good  Language: Good  Akathisia:  No  Handed:  Right  AIMS (if indicated): not done  Assets:  Communication Skills Desire for Improvement Resilience Social Support   ADL's:  Intact  Cognition: WNL  Sleep:  Good   Screenings: GAD-7    Flowsheet Row Office Visit from 01/24/2022 in [facility]  Total GAD-7 Score 16      PHQ2-9    Flowsheet Row Office Visit from 05/21/2023 in [facility] [place] Counselor from 04/26/2023 in [facility] [place] Counselor from 02/06/2023 in [facility] [place] Counselor from 11/01/2022 in [facility] at [place] Video Visit from 09/05/2022 in [facility] at [facility] Total Score 5 2 2 3 6   PHQ-9 Total Score 14 13 15 10 16       Flowsheet Row Office Visit from 05/21/2023 in [facility] [place] Counselor from 11/01/2022 in [facility] at [place] Video Visit from 09/05/2022 in [facility] at [place]  C-SSRS RISK CATEGORY No Risk No Risk Error: Q3, 4, or 5 should not be populated when Q2 is No        Assessment and Plan: This patient is a 72 year old female with a history of depression anxiety chronic pain and a remote history of substance use.  She does not feel that much better with Prozac so we will switch to Wellbutrin XL 150 mg every morning for depression.  She will continue Depakote DR 500 mg daily for mood stabilization Xanax 0.5 mg 3 times daily for anxiety and trazodone 100 mg at bedtime for sleep.  Collaboration of Care: Collaboration of Care: Referral or follow-up with counselor/therapist AEB patient will continue therapy with Florencia Reasons in our office  Patient/Guardian was advised Release of Information must be obtained prior to any record release in order to collaborate their care with an outside provider. Patient/Guardian was advised if they have not already done so to contact the registration department to  sign all  necessary forms in order for [place] to release information regarding their care.   Consent: Patient/Guardian gives verbal consent for treatment and assignment of benefits for services provided during this visit. Patient/Guardian expressed understanding and agreed to proceed.    [name], MD 05/21/2023, 1:23 PM

## 2023-05-23 ENCOUNTER — Other Ambulatory Visit: Payer: Self-pay | Admitting: Orthopedic Surgery

## 2023-05-28 ENCOUNTER — Encounter: Payer: Self-pay | Admitting: Orthopedic Surgery

## 2023-05-28 ENCOUNTER — Other Ambulatory Visit (INDEPENDENT_AMBULATORY_CARE_PROVIDER_SITE_OTHER): Payer: 59

## 2023-05-28 ENCOUNTER — Ambulatory Visit (INDEPENDENT_AMBULATORY_CARE_PROVIDER_SITE_OTHER): Payer: 59 | Admitting: Orthopedic Surgery

## 2023-05-28 ENCOUNTER — Other Ambulatory Visit: Payer: Self-pay

## 2023-05-28 VITALS — Ht 63.0 in | Wt 223.0 lb

## 2023-05-28 DIAGNOSIS — G5791 Unspecified mononeuropathy of right lower limb: Secondary | ICD-10-CM | POA: Diagnosis not present

## 2023-05-28 DIAGNOSIS — G894 Chronic pain syndrome: Secondary | ICD-10-CM | POA: Diagnosis not present

## 2023-05-28 DIAGNOSIS — M23322 Other meniscus derangements, posterior horn of medial meniscus, left knee: Secondary | ICD-10-CM

## 2023-05-28 DIAGNOSIS — Z96651 Presence of right artificial knee joint: Secondary | ICD-10-CM

## 2023-05-28 DIAGNOSIS — M25562 Pain in left knee: Secondary | ICD-10-CM

## 2023-05-28 DIAGNOSIS — M1712 Unilateral primary osteoarthritis, left knee: Secondary | ICD-10-CM

## 2023-05-28 MED ORDER — GABAPENTIN 100 MG PO CAPS
100.0000 mg | ORAL_CAPSULE | Freq: Three times a day (TID) | ORAL | 2 refills | Status: DC
Start: 2023-05-28 — End: 2023-08-15

## 2023-05-28 MED ORDER — OXYCODONE-ACETAMINOPHEN 10-325 MG PO TABS
1.0000 | ORAL_TABLET | ORAL | 0 refills | Status: DC | PRN
Start: 2023-05-28 — End: 2023-06-04

## 2023-05-28 NOTE — Patient Instructions (Signed)
Your insurance does not require approval for the MRI please go ahead and call to schedule your appointment with [facility] within at least one (1) week.   Central Scheduling [phone]

## 2023-05-28 NOTE — Progress Notes (Signed)
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  Complains of pain posterior to the right knee which radiates down the right leg   Also complains of left knee pain and swelling  Patient is using a cane  Recently started on Wellbutrin for depression  Also has chronic pain right knee w/ chronic pain management  Oxycodone 06/13/2024: 30 tablets every 5 days  Diane [name] is having pain in the back of the knee the right knee arthroplasty is functioning well with no signs of loosening I think she has some nerve injury or radicular-like symptoms in the right leg treat with gabapentin  Left knee swelling medial joint line tenderness fortunately her range of motion is 0 to 120 degrees her x-ray shows arthritis but it is mild recommend MRI to rule out meniscal tear  Refill oxycodone start gabapentin  Meds ordered this encounter  Medications   gabapentin (NEURONTIN) 100 MG capsule    Sig: Take 1 capsule (100 mg total) by mouth 3 (three) times daily.    Dispense:  90 capsule    Refill:  2   oxyCODONE-acetaminophen (PERCOCET) 10-325 MG tablet    Sig: Take 1 tablet by mouth every 4 (four) hours as needed. for pain    Dispense:  30 tablet    Refill:  0

## 2023-06-04 ENCOUNTER — Other Ambulatory Visit: Payer: Self-pay | Admitting: Gastroenterology

## 2023-06-04 ENCOUNTER — Other Ambulatory Visit: Payer: Self-pay | Admitting: Orthopedic Surgery

## 2023-06-04 DIAGNOSIS — G894 Chronic pain syndrome: Secondary | ICD-10-CM

## 2023-06-05 ENCOUNTER — Other Ambulatory Visit: Payer: Self-pay | Admitting: Gastroenterology

## 2023-06-09 ENCOUNTER — Ambulatory Visit (HOSPITAL_COMMUNITY)
Admission: RE | Admit: 2023-06-09 | Discharge: 2023-06-09 | Disposition: A | Discharge status: Home / Self Care | Payer: 59 | Source: Ambulatory Visit | Attending: Orthopedic Surgery | Admitting: Orthopedic Surgery

## 2023-06-09 ENCOUNTER — Emergency Department (HOSPITAL_COMMUNITY): Payer: 59

## 2023-06-09 ENCOUNTER — Encounter (HOSPITAL_COMMUNITY): Payer: Self-pay

## 2023-06-09 ENCOUNTER — Emergency Department (HOSPITAL_COMMUNITY)
Admission: EM | Admit: 2023-06-09 | Discharge: 2023-06-10 | Disposition: A | Discharge status: Home / Self Care | Payer: 59 | Attending: Emergency Medicine | Admitting: Emergency Medicine

## 2023-06-09 ENCOUNTER — Other Ambulatory Visit: Payer: Self-pay

## 2023-06-09 DIAGNOSIS — G319 Degenerative disease of nervous system, unspecified: Secondary | ICD-10-CM | POA: Diagnosis not present

## 2023-06-09 DIAGNOSIS — M25562 Pain in left knee: Secondary | ICD-10-CM

## 2023-06-09 DIAGNOSIS — W06XXXA Fall from bed, initial encounter: Secondary | ICD-10-CM | POA: Insufficient documentation

## 2023-06-09 DIAGNOSIS — I771 Stricture of artery: Secondary | ICD-10-CM | POA: Diagnosis not present

## 2023-06-09 DIAGNOSIS — S0990XA Unspecified injury of head, initial encounter: Secondary | ICD-10-CM | POA: Diagnosis not present

## 2023-06-09 DIAGNOSIS — R519 Headache, unspecified: Secondary | ICD-10-CM | POA: Insufficient documentation

## 2023-06-09 DIAGNOSIS — S8992XA Unspecified injury of left lower leg, initial encounter: Secondary | ICD-10-CM | POA: Diagnosis not present

## 2023-06-09 DIAGNOSIS — M948X6 Other specified disorders of cartilage, lower leg: Secondary | ICD-10-CM | POA: Diagnosis not present

## 2023-06-09 DIAGNOSIS — M1712 Unilateral primary osteoarthritis, left knee: Secondary | ICD-10-CM | POA: Diagnosis not present

## 2023-06-09 DIAGNOSIS — R079 Chest pain, unspecified: Secondary | ICD-10-CM | POA: Diagnosis not present

## 2023-06-09 DIAGNOSIS — S199XXA Unspecified injury of neck, initial encounter: Secondary | ICD-10-CM | POA: Diagnosis not present

## 2023-06-09 DIAGNOSIS — I77819 Aortic ectasia, unspecified site: Secondary | ICD-10-CM | POA: Diagnosis not present

## 2023-06-09 DIAGNOSIS — W19XXXA Unspecified fall, initial encounter: Secondary | ICD-10-CM

## 2023-06-09 DIAGNOSIS — M25462 Effusion, left knee: Secondary | ICD-10-CM | POA: Diagnosis not present

## 2023-06-09 DIAGNOSIS — M542 Cervicalgia: Secondary | ICD-10-CM | POA: Insufficient documentation

## 2023-06-09 DIAGNOSIS — I6782 Cerebral ischemia: Secondary | ICD-10-CM | POA: Diagnosis not present

## 2023-06-09 LAB — CBC WITH DIFFERENTIAL/PLATELET
Abs Immature Granulocytes: 0 10*3/uL (ref 0.00–0.07)
Basophils Absolute: 0 10*3/uL (ref 0.0–0.1)
Basophils Relative: 1 %
Eosinophils Absolute: 0.1 10*3/uL (ref 0.0–0.5)
Eosinophils Relative: 2 %
HCT: 37 % (ref 36.0–46.0)
Hemoglobin: 11.5 g/dL — ABNORMAL LOW (ref 12.0–15.0)
Immature Granulocytes: 0 %
Lymphocytes Relative: 45 %
Lymphs Abs: 2.2 10*3/uL (ref 0.7–4.0)
MCH: 28.2 pg (ref 26.0–34.0)
MCHC: 31.1 g/dL (ref 30.0–36.0)
MCV: 90.7 fL (ref 80.0–100.0)
Monocytes Absolute: 0.6 10*3/uL (ref 0.1–1.0)
Monocytes Relative: 12 %
Neutro Abs: 1.9 10*3/uL (ref 1.7–7.7)
Neutrophils Relative %: 40 %
Platelets: 159 10*3/uL (ref 150–400)
RBC: 4.08 MIL/uL (ref 3.87–5.11)
RDW: 13.3 % (ref 11.5–15.5)
WBC: 4.8 10*3/uL (ref 4.0–10.5)
nRBC: 0 % (ref 0.0–0.2)

## 2023-06-09 LAB — TROPONIN I (HIGH SENSITIVITY): Troponin I (High Sensitivity): 2 ng/L (ref <18)

## 2023-06-09 LAB — BASIC METABOLIC PANEL
Anion gap: 7 (ref 5–15)
BUN: 21 mg/dL (ref 8–23)
CO2: 29 mmol/L (ref 22–32)
Calcium: 8.8 mg/dL — ABNORMAL LOW (ref 8.9–10.3)
Chloride: 101 mmol/L (ref 98–111)
Creatinine, Ser: 1.11 mg/dL — ABNORMAL HIGH (ref 0.44–1.00)
GFR, Estimated: 53 mL/min — ABNORMAL LOW (ref >60)
Glucose, Bld: 102 mg/dL — ABNORMAL HIGH (ref 70–99)
Potassium: 4.8 mmol/L (ref 3.5–5.1)
Sodium: 137 mmol/L (ref 135–145)

## 2023-06-09 MED ORDER — ACETAMINOPHEN 325 MG PO TABS
650.0000 mg | ORAL_TABLET | Freq: Once | ORAL | Status: AC
  Administered 2023-06-10: 650 mg via ORAL
  Filled 2023-06-09: qty 2

## 2023-06-09 NOTE — Discharge Instructions (Signed)
You were seen in the Emergency Department after a fall The CAT scan taken of your head and neck did not show any evidence of traumatic findings Your chest x-ray looked okay Your EKG did not show any abnormal rhythms Your blood work all looked okay as well It is important that you follow-up with your primary care doctor in 1 week for reevaluation Return to the emergency room for repeated falls, chest pain, trouble breathing or any other concerns

## 2023-06-09 NOTE — ED Provider Notes (Signed)
[place] EMERGENCY DEPARTMENT AT [facility] Provider Note   CSN: [id number] Arrival date & time: 06/09/23  1725     History  Chief Complaint  Patient presents with   Diane [name] is a 72 y.o. female.  With a past medical history of osteoarthritis of the knee who presents to the ED after a fall.  Patient reports 3 falls in the last week while ambulating with her cane at home.  She is uncertain as to exactly why she fell but states she may have hit her head when falling out of bed 2 days ago.  Uncertain if she lost consciousness.  Now with pain in the right side of her neck.  Chronic knee pain in the setting of osteoarthritis status post TKR.  Denies headaches, changes in vision, focal weakness, chest pain, abdominal pain, nausea, vomiting.   Fall       Home Medications Prior to Admission medications   Medication Sig Start Date End Date Taking? Authorizing Provider  ALPRAZolam Prudy Feeler) 0.5 MG tablet Take 1 tablet (0.5 mg total) by mouth 3 (three) times daily. 05/21/23   Myrlene Broker, MD  atorvastatin (LIPITOR) 20 MG tablet Take 20 mg by mouth daily.    [provider]  buPROPion (WELLBUTRIN XL) 150 MG 24 hr tablet Take 1 tablet (150 mg total) by mouth every morning. 05/21/23 05/20/24  Myrlene Broker, MD  CONSTULOSE 10 GM/15ML solution TAKE 30 ML BY MOUTH TWICE DAILY 06/06/23   Aida Raider, NP  cyclobenzaprine (FLEXERIL) 10 MG tablet TAKE 1 TABLET BY MOUTH THREE TIMES DAILY AS NEEDED FOR MUSCLE SPASMS 03/20/23   Vickki Hearing, MD  divalproex (DEPAKOTE ER) 500 MG 24 hr tablet Take 1 tablet (500 mg total) by mouth at bedtime. 05/21/23   Myrlene Broker, MD  gabapentin (NEURONTIN) 100 MG capsule Take 1 capsule (100 mg total) by mouth 3 (three) times daily. 05/28/23   Vickki Hearing, MD  hydrocortisone (ANUSOL-HC) 2.5 % rectal cream Place 1 application rectally 2 (two) times daily. 02/06/18   Gelene Mink, NP  IBU 800 MG tablet TAKE ONE TABLET BY  MOUTH EVERY 8 HOURS AS NEEDED 11/07/22   Vickki Hearing, MD  levothyroxine (SYNTHROID) 100 MCG tablet Take 100 mcg by mouth daily before breakfast. 09/27/21   [provider]  lisinopril (PRINIVIL,ZESTRIL) 10 MG tablet Take 10 mg by mouth at bedtime.     [provider]  lubiprostone (AMITIZA) 24 MCG capsule TAKE ONE CAPSULE BY MOUTH TWICE DAILY WITH A MEAL 06/21/22   Aida Raider, NP  meloxicam (MOBIC) 7.5 MG tablet TAKE ONE TABLET BY MOUTH ONCE DAILY 12/28/22   Vickki Hearing, MD  Naldemedine Tosylate (SYMPROIC) 0.2 MG TABS Take 0.2 mg by mouth daily.    [provider]  omeprazole (PRILOSEC) 20 MG capsule TAKE ONE CAPSULE BY MOUTH TWICE DAILY BEFORE A MEAL. 05/11/22   Tiffany Kocher, PA-C  oxybutynin (DITROPAN-XL) 10 MG 24 hr tablet Take 10 mg by mouth at bedtime.    [provider]  oxyCODONE-acetaminophen (PERCOCET) 10-325 MG tablet TAKE 1 TABLET BY MOUTH EVERY 4 HOURS AS NEEDED FOR PAIN 06/04/23   Vickki Hearing, MD  traZODone (DESYREL) 100 MG tablet Take 1 tablet (100 mg total) by mouth at bedtime. 05/21/23   Myrlene Broker, MD      Allergies    Bacitra-neomycin-polymyxin-hc, Neomycin-bacitracin zn-polymyx, Penicillins, and Sulfonamide derivatives  Review of Systems   Review of Systems  Physical Exam Updated Vital Signs BP (!) 151/77   Pulse 64   Temp 98.1 F (36.7 C) (Oral)   Resp 12   Ht 5\' 3"  (1.6 m)   Wt 101.2 kg   SpO2 99%   BMI 39.50 kg/m  Physical Exam Vitals and nursing note reviewed.  HENT:     Head: Normocephalic and atraumatic.  Eyes:     Pupils: Pupils are equal, round, and reactive to light.  Cardiovascular:     Rate and Rhythm: Normal rate and regular rhythm.  Pulmonary:     Effort: Pulmonary effort is normal.     Breath sounds: Normal breath sounds.  Abdominal:     Palpations: Abdomen is soft.     Tenderness: There is no abdominal tenderness.  Skin:    General: Skin is warm and dry.  Neurological:      General: No focal deficit present.     Mental Status: She is alert and oriented to person, place, and time. Mental status is at baseline.     Sensory: No sensory deficit.     Motor: No weakness.     Coordination: Coordination normal.  Psychiatric:        Mood and Affect: Mood normal.     ED Results / Procedures / Treatments   Labs (all labs ordered are listed, but only abnormal results are displayed) Labs Reviewed  CBC WITH DIFFERENTIAL/PLATELET - Abnormal; Notable for the following components:      Result Value   Hemoglobin 11.5 (*)    All other components within normal limits  BASIC METABOLIC PANEL - Abnormal; Notable for the following components:   Glucose, Bld 102 (*)    Creatinine, Ser 1.11 (*)    Calcium 8.8 (*)    GFR, Estimated 53 (*)    All other components within normal limits  TROPONIN I (HIGH SENSITIVITY)    EKG EKG Interpretation Date/Time:  Saturday June 09 2023 22:48:03 EDT Ventricular Rate:  63 PR Interval:  193 QRS Duration:  104 QT Interval:  414 QTC Calculation: 424 R Axis:   5  Text Interpretation: Sinus rhythm Low voltage, precordial leads Abnormal R-wave progression, early transition Confirmed by Estelle June [phone]) on 06/09/2023 11:33:09 PM  Radiology CT Head Wo Contrast  Result Date: 06/09/2023 CLINICAL DATA:  Head and neck trauma, fall. EXAM: CT HEAD WITHOUT CONTRAST CT CERVICAL SPINE WITHOUT CONTRAST TECHNIQUE: Multidetector CT imaging of the head and cervical spine was performed following the standard protocol without intravenous contrast. Multiplanar CT image reconstructions of the cervical spine were also generated. RADIATION DOSE REDUCTION: This exam was performed according to the departmental dose-optimization program which includes automated exposure control, adjustment of the mA and/or kV according to patient size and/or use of iterative reconstruction technique. COMPARISON:  08/10/2022. FINDINGS: CT HEAD FINDINGS Brain: No acute  intracranial hemorrhage, midline shift or mass effect. No extra-axial fluid collection. Diffuse atrophy is noted. Periventricular white matter hypodensities are present bilaterally. Hydrocephalus. Vascular: No hyperdense vessel or unexpected calcification. Skull: Normal. Negative for fracture or focal lesion. Sinuses/Orbits: Mild mucosal thickening is present in the ethmoid air cells. No acute orbital abnormality. Other: None. CT CERVICAL SPINE FINDINGS Alignment: Normal. Skull base and vertebrae: No acute fracture. No primary bone lesion or focal pathologic process. Soft tissues and spinal canal: No prevertebral fluid or swelling. No visible canal hematoma. Disc levels: Mild degenerative endplate changes and facet arthropathy are present in the cervical spine. Upper  chest: No acute abnormality. Other: None. IMPRESSION: 1. No acute intracranial process. 2. Atrophy with chronic microvascular ischemic changes. 3. Degenerative changes in the cervical spine without evidence of acute fracture. Electronically Signed   By: Thornell Sartorius M.D.   On: 06/09/2023 22:50   CT Cervical Spine Wo Contrast  Result Date: 06/09/2023 CLINICAL DATA:  Head and neck trauma, fall. EXAM: CT HEAD WITHOUT CONTRAST CT CERVICAL SPINE WITHOUT CONTRAST TECHNIQUE: Multidetector CT imaging of the head and cervical spine was performed following the standard protocol without intravenous contrast. Multiplanar CT image reconstructions of the cervical spine were also generated. RADIATION DOSE REDUCTION: This exam was performed according to the departmental dose-optimization program which includes automated exposure control, adjustment of the mA and/or kV according to patient size and/or use of iterative reconstruction technique. COMPARISON:  08/10/2022. FINDINGS: CT HEAD FINDINGS Brain: No acute intracranial hemorrhage, midline shift or mass effect. No extra-axial fluid collection. Diffuse atrophy is noted. Periventricular white matter hypodensities  are present bilaterally. Hydrocephalus. Vascular: No hyperdense vessel or unexpected calcification. Skull: Normal. Negative for fracture or focal lesion. Sinuses/Orbits: Mild mucosal thickening is present in the ethmoid air cells. No acute orbital abnormality. Other: None. CT CERVICAL SPINE FINDINGS Alignment: Normal. Skull base and vertebrae: No acute fracture. No primary bone lesion or focal pathologic process. Soft tissues and spinal canal: No prevertebral fluid or swelling. No visible canal hematoma. Disc levels: Mild degenerative endplate changes and facet arthropathy are present in the cervical spine. Upper chest: No acute abnormality. Other: None. IMPRESSION: 1. No acute intracranial process. 2. Atrophy with chronic microvascular ischemic changes. 3. Degenerative changes in the cervical spine without evidence of acute fracture. Electronically Signed   By: Thornell Sartorius M.D.   On: 06/09/2023 22:50   DG Chest Portable 1 View  Result Date: 06/09/2023 CLINICAL DATA:  Patient fell on Thursday. Neck pain and right-sided pain. Shortness of breath. EXAM: PORTABLE CHEST 1 VIEW COMPARISON:  08/10/2022 FINDINGS: Heart size and pulmonary vascularity are normal. Lungs are clear. No pleural effusions. No pneumothorax. Mediastinal contours appear intact. Tortuous and ectatic aorta. Degenerative changes in the shoulders. IMPRESSION: No active disease. Electronically Signed   By: Burman Nieves M.D.   On: 06/09/2023 22:48    Procedures Procedures    Medications Ordered in ED Medications - No data to display  ED Course/ Medical Decision Making/ A&P Clinical Course as of 06/09/23 2340  Sat Jun 09, 2023  2337 CT head C-spine showed no acute traumatic findings.  Chest x-ray NAD.  CBC andl metabolic panel show no significant electrolyte imbalances and renal function at apparent baseline.  Informed patient of findings of ED workup.  We discussed rationale for admission for syncope rule out and further workup  versus discharge with close PCP follow-up.  She is able to ambulate steady gait with her cane and feels comfortable with discharge home with plan for close PCP follow-up.  Stable for discharge at this time [MP]    Clinical Course User Index [MP] Royanne Foots, DO                                 Medical Decision Making 72 year old female with history as above presenting following multiple falls in the last week.  Ambulates with a cane and lives alone.  Reports multiple falls and think she may have hit her head.  Now with pain in her right neck.  Unclear etiology of her  falls although she tells me she has been unsteady on her feet for years and has chronic knee pain.  No prodrome of syncope prior to these falls.  Denies chest pain.  Will evaluate for traumatic findings with CT head CT neck as well as a chest x-ray.  Will evaluate for underlying metabolic disturbance that would be the cause of her fall such as electrolyte imbalance and also obtain CBC to look for anemia and leukocytosis.  Will obtain EKG to evaluate for any evidence of dysrhythmia.  Amount and/or Complexity of Data Reviewed Labs: ordered. Radiology: ordered.           Final Clinical Impression(s) / ED Diagnoses Final diagnoses:  Fall, initial encounter    Rx / DC Orders ED Discharge Orders     None         Royanne Foots, DO 06/09/23 2340

## 2023-06-09 NOTE — ED Triage Notes (Signed)
Pt states that she fell on Thursday. Pt complains of neck pain and R side pain. No thinners. Pt states that she passed out "for a little bit". Pt ambulatory to triage. Pt states that she came in for pain control.

## 2023-06-11 ENCOUNTER — Other Ambulatory Visit: Payer: Self-pay | Admitting: Orthopedic Surgery

## 2023-06-11 DIAGNOSIS — G894 Chronic pain syndrome: Secondary | ICD-10-CM

## 2023-06-15 DIAGNOSIS — I10 Essential (primary) hypertension: Secondary | ICD-10-CM | POA: Diagnosis not present

## 2023-06-15 DIAGNOSIS — G8929 Other chronic pain: Secondary | ICD-10-CM | POA: Diagnosis not present

## 2023-06-15 DIAGNOSIS — Z23 Encounter for immunization: Secondary | ICD-10-CM | POA: Diagnosis not present

## 2023-06-15 DIAGNOSIS — H8110 Benign paroxysmal vertigo, unspecified ear: Secondary | ICD-10-CM | POA: Diagnosis not present

## 2023-06-18 ENCOUNTER — Other Ambulatory Visit: Payer: Self-pay | Admitting: Orthopedic Surgery

## 2023-06-18 DIAGNOSIS — G894 Chronic pain syndrome: Secondary | ICD-10-CM

## 2023-06-19 ENCOUNTER — Other Ambulatory Visit: Payer: Self-pay | Admitting: Orthopedic Surgery

## 2023-06-19 DIAGNOSIS — G894 Chronic pain syndrome: Secondary | ICD-10-CM

## 2023-06-25 ENCOUNTER — Other Ambulatory Visit: Payer: Self-pay | Admitting: Orthopedic Surgery

## 2023-06-25 ENCOUNTER — Telehealth: Payer: Self-pay | Admitting: Orthopedic Surgery

## 2023-06-25 DIAGNOSIS — G894 Chronic pain syndrome: Secondary | ICD-10-CM

## 2023-06-25 MED ORDER — OXYCODONE-ACETAMINOPHEN 10-325 MG PO TABS: 1.0000 | ORAL_TABLET | ORAL | 0 refills | Status: DC | PRN

## 2023-06-25 NOTE — Telephone Encounter (Signed)
Dr. Mort Sawyers pt - spoke w/the patient this morning, Diane [name] stated that Dr. Romeo Apple lvm for her last week regarding her MRI results, but Diane [name] doesn't understand, Diane [name]'d like a call back.  Diane [name] is also requesting a refill on Oxycodone 10-325, 30 tablets, every 4 hours PRN to be sent to [facility].

## 2023-06-25 NOTE — Telephone Encounter (Signed)
Returned the patient's call, mailbox is full, unable to lvm.

## 2023-07-09 ENCOUNTER — Other Ambulatory Visit: Payer: Self-pay | Admitting: Orthopedic Surgery

## 2023-07-09 DIAGNOSIS — G894 Chronic pain syndrome: Secondary | ICD-10-CM

## 2023-07-16 ENCOUNTER — Other Ambulatory Visit (HOSPITAL_COMMUNITY): Payer: Self-pay | Admitting: Psychiatry

## 2023-07-16 ENCOUNTER — Other Ambulatory Visit: Payer: Self-pay | Admitting: Orthopedic Surgery

## 2023-07-17 ENCOUNTER — Ambulatory Visit (INDEPENDENT_AMBULATORY_CARE_PROVIDER_SITE_OTHER): Payer: 59 | Admitting: Psychiatry

## 2023-07-17 ENCOUNTER — Other Ambulatory Visit: Payer: Self-pay | Admitting: Orthopedic Surgery

## 2023-07-17 DIAGNOSIS — F3130 Bipolar disorder, current episode depressed, mild or moderate severity, unspecified: Secondary | ICD-10-CM | POA: Diagnosis not present

## 2023-07-17 DIAGNOSIS — G894 Chronic pain syndrome: Secondary | ICD-10-CM

## 2023-07-17 NOTE — Progress Notes (Signed)
IN- PERSON  THERAPIST PROGRESS NOTE  Session Time: Tuesday 07/17/2023 1:06 PM  - 1:55 PM   Participation Level: Active  Behavioral Response: CasualAlertDepressed/tesrful  Type of Therapy: Individual Therapy  Treatment Goals addressed: Reduce frequency, intensity, and duration of depression symptoms so that daily functioning is improved AEB reduced episodes of symptoms from daily to 1 x per week Reduce overall depression score by a minimum of 50% on the Patient Health Questionnaire (PHQ-9  : [name] will practice behavioral activation skills 5 times per week for the next 26 weeks   ProgressTowards Goals:  progressing  Interventions: CBT and Supportive  Summary: Diane [name] is a 72 y.o. female who presents is referred for services by psychiatrist Dr. Tenny Craw due to pt experiencing anxiety and depression. She is a returning pt to this clinician and last was seen many years ago.  She denies any psychiatric hospitalizations.  Patient reports several stressors including financial issues along with grief and loss issues related to the death of her ex-husband as well as the death of her brother in 2022-11-15.  Patient reports excessive worry, feeling overwhelmed, depressed mood, and tearfulness.  Patient last was seen about 2 months ago.  Patient denies any symptoms of depression in the past 2 weeks as reflected in the PHQ 2.  She reports much improved mood since last session.  Per patient's report, she visited her granddaughter in [place] for about 3 to 4 weeks and just returned home yesterday.  She reports enjoying this trip very much as she felt strong support from her granddaughter.  She reports increased behavioral activation as she attended church on Sundays, attended her great grandchild's basketball games, and exercised by going on walks with her granddaughter.  Prior to the trip, patient reports being very depressed and still being frustrated with both her children as she reports neither  one of them seem to care about her based on their actions.  However, she reports deciding she has to learn to let this go.  She expresses determination to try to avoid having a severe bout of depression and expresses desire to increase involvement in activity.    Suicidal/Homicidal: Nowithout intent/plan patient agrees to call 911, [phone], or have someone take her to the ED should symptoms worsen.  Therapist Response: Reviewed symptoms, administered PHQ 2, discussed results, facilitated patient expressing thoughts and feelings about her recent visit with her granddaughter, discussed the effects of treatment on patient's mood/thoughts/behavior, discussed the role of behavioral activation, assisted patient identify ways to continue increased behavioral activation with the use of daily planning, assisted patient identify possible activities including attending church/exercising/going to grandsons baseball games/visiting a friend, assisted patient link plan to her values, also encouraged patient to maintain contact with her granddaughter, discussed stepdown plan to termination to include 2 more sessions focusing on relapse prevention strategies    Plan: Return again in 2 weeks.  Diagnosis: Bipolar I disorder, most recent episode depressed (HCC)  Collaboration of Care: Psychiatrist AEB sees psychiatrist Dr. Tenny Craw in this practice  Patient/Guardian was advised Release of Information must be obtained prior to any record release in order to collaborate their care with an outside provider. Patient/Guardian was advised if they have not already done so to contact the registration department to sign all necessary forms in order for [place] to release information regarding their care.   Consent: Patient/Guardian gives verbal consent for treatment and assignment of benefits for services provided during this visit. Patient/Guardian expressed understanding and agreed to proceed.  Adah Salvage, LCSW 07/17/2023

## 2023-07-18 ENCOUNTER — Telehealth: Payer: Self-pay | Admitting: Orthopedic Surgery

## 2023-07-18 NOTE — Telephone Encounter (Signed)
Duplicate request, was requested yesterday sent to Dr Rexene Edison today

## 2023-07-18 NOTE — Telephone Encounter (Signed)
Dr. Mort Sawyers pt - spoke w/the patient, she is requesting a refill on Oxycodone 10-325, 30 tablets, every 4 hours PRN to be sent to [facility].

## 2023-07-23 ENCOUNTER — Other Ambulatory Visit: Payer: Self-pay | Admitting: Orthopedic Surgery

## 2023-07-23 DIAGNOSIS — G894 Chronic pain syndrome: Secondary | ICD-10-CM

## 2023-07-26 ENCOUNTER — Other Ambulatory Visit (HOSPITAL_COMMUNITY): Payer: Self-pay | Admitting: Psychiatry

## 2023-07-30 ENCOUNTER — Other Ambulatory Visit: Payer: Self-pay | Admitting: Orthopedic Surgery

## 2023-07-30 ENCOUNTER — Other Ambulatory Visit (HOSPITAL_COMMUNITY): Payer: Self-pay | Admitting: Psychiatry

## 2023-07-30 DIAGNOSIS — G894 Chronic pain syndrome: Secondary | ICD-10-CM

## 2023-07-31 ENCOUNTER — Ambulatory Visit (INDEPENDENT_AMBULATORY_CARE_PROVIDER_SITE_OTHER): Payer: 59 | Admitting: Psychiatry

## 2023-07-31 DIAGNOSIS — F3130 Bipolar disorder, current episode depressed, mild or moderate severity, unspecified: Secondary | ICD-10-CM | POA: Diagnosis not present

## 2023-07-31 NOTE — Progress Notes (Signed)
IN- PERSON  THERAPIST PROGRESS NOTE  Session Time: Tuesday 07/31/2023 1:15 PM - 1:54 PM   Participation Level: Active  Behavioral Response: CasualAlert/pleasant   Type of Therapy: Individual Therapy  Treatment Goals addressed: Reduce frequency, intensity, and duration of depression symptoms so that daily functioning is improved AEB reduced episodes of symptoms from daily to 1 x per week Reduce overall depression score by a minimum of 50% on the Patient Health Questionnaire (PHQ-9  : Diane [name] will practice behavioral activation skills 5 times per week for the next 26 weeks   ProgressTowards Goals:  progressing  Interventions: CBT and Supportive  Summary: Diane [name] is a 72 y.o. female who presents is referred for services by psychiatrist Dr. Tenny Craw due to pt experiencing anxiety and depression. She is a returning pt to this clinician and last was seen many years ago.  She denies any psychiatric hospitalizations.  Patient reports several stressors including financial issues along with grief and loss issues related to the death of her ex-husband as well as the death of her brother in 2022-11-21.  Patient reports excessive worry, feeling overwhelmed, depressed mood, and tearfulness.  Patient last was seen about 2 weeks ago.  Patient denies any symptoms of depression in the past 2 weeks as reflected in the PHQ 2.  She has made successful efforts to continue behavioral activation.  Patient reports doing household tenderness, attending church, visiting friends, and going to her grandsons basketball game.  She also states staying away from a couple of her family members who are negative and have a tendency to tell her there are problems.  Patient reports setting and maintaining limits.  Patient is very pleased with her efforts and reports feeling positive.  He also reports now being able to enjoy time at home alone and feel content.  Patient reports planning to attend the gym.   Suicidal/Homicidal:  Nowithout intent/plan patient agrees to call 911, [phone], or have someone take her to the ED should symptoms worsen.  Therapist Response: Reviewed symptoms, administered PHQ 2, discussed results, praised and reinforced patient's increased behavioral activation, discussed effects on mood/thoughts/behavior, assisted patient identify ways to maintain consistent efforts with the use of daily planning, encouraged patient to follow through with her plan to start exercising by going to the gym, discussed possible effects on her emotional health, praised and reinforced patient's efforts to set and maintain limits, provided psychoeducation on lapse versus relapse of depression, assisted patient identify early warning signs of depression, assisted patient identify ways to intervene to avoid relapse   Plan: Return again in 2 weeks.  Diagnosis: Bipolar I disorder, most recent episode depressed (HCC)  Collaboration of Care: Psychiatrist AEB sees psychiatrist Dr. Tenny Craw in this practice  Patient/Guardian was advised Release of Information must be obtained prior to any record release in order to collaborate their care with an outside provider. Patient/Guardian was advised if they have not already done so to contact the registration department to sign all necessary forms in order for [place] to release information regarding their care.   Consent: Patient/Guardian gives verbal consent for treatment and assignment of benefits for services provided during this visit. Patient/Guardian expressed understanding and agreed to proceed.   Adah Salvage, LCSW 07/31/2023

## 2023-08-06 ENCOUNTER — Other Ambulatory Visit: Payer: Self-pay | Admitting: Orthopedic Surgery

## 2023-08-06 ENCOUNTER — Ambulatory Visit: Payer: Medicare Other | Admitting: Orthopedic Surgery

## 2023-08-06 DIAGNOSIS — Z96651 Presence of right artificial knee joint: Secondary | ICD-10-CM | POA: Insufficient documentation

## 2023-08-06 DIAGNOSIS — G894 Chronic pain syndrome: Secondary | ICD-10-CM

## 2023-08-13 ENCOUNTER — Telehealth: Payer: Self-pay | Admitting: Orthopedic Surgery

## 2023-08-13 ENCOUNTER — Other Ambulatory Visit (INDEPENDENT_AMBULATORY_CARE_PROVIDER_SITE_OTHER): Payer: 59

## 2023-08-13 ENCOUNTER — Ambulatory Visit (INDEPENDENT_AMBULATORY_CARE_PROVIDER_SITE_OTHER): Payer: 59 | Admitting: Orthopedic Surgery

## 2023-08-13 ENCOUNTER — Other Ambulatory Visit: Payer: Self-pay | Admitting: Orthopedic Surgery

## 2023-08-13 DIAGNOSIS — M1710 Unilateral primary osteoarthritis, unspecified knee: Secondary | ICD-10-CM

## 2023-08-13 DIAGNOSIS — G894 Chronic pain syndrome: Secondary | ICD-10-CM | POA: Diagnosis not present

## 2023-08-13 DIAGNOSIS — Z96651 Presence of right artificial knee joint: Secondary | ICD-10-CM

## 2023-08-13 DIAGNOSIS — M1712 Unilateral primary osteoarthritis, left knee: Secondary | ICD-10-CM

## 2023-08-13 MED ORDER — OXYCODONE-ACETAMINOPHEN 10-325 MG PO TABS: 1.0000 | ORAL_TABLET | ORAL | 0 refills | Status: DC | PRN

## 2023-08-13 NOTE — Telephone Encounter (Signed)
Dr. Mort Sawyers pt - spoke w/Cathy from [facility], she stated that the patient was see earlier this afternoon and that Dr. Romeo Apple was going to send in Oxycodone 10-325, 30 tablets, every 4 hours PRN.  The patient is there now and they wanted to catch [place] before we close.

## 2023-08-13 NOTE — Progress Notes (Signed)
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  Arthroscopy will not help    Meds ordered this encounter  Medications   oxyCODONE-acetaminophen (PERCOCET) 10-325 MG tablet    Sig: Take 1 tablet by mouth every 4 (four) hours as needed. for pain    Dispense:  30 tablet    Refill:  0    Encounter Diagnoses  Name Primary?   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

## 2023-08-14 ENCOUNTER — Ambulatory Visit (HOSPITAL_COMMUNITY): Payer: 59 | Admitting: Psychiatry

## 2023-08-14 ENCOUNTER — Ambulatory Visit (INDEPENDENT_AMBULATORY_CARE_PROVIDER_SITE_OTHER): Payer: 59 | Admitting: Psychiatry

## 2023-08-14 DIAGNOSIS — F3130 Bipolar disorder, current episode depressed, mild or moderate severity, unspecified: Secondary | ICD-10-CM | POA: Diagnosis not present

## 2023-08-14 NOTE — Progress Notes (Signed)
IN- PERSON  THERAPIST PROGRESS NOTE  Session Time: Tuesday 08/14/2023 1:10 PM - 1:53 PM  Participation Level: Active  Behavioral Response: CasualAlert/pleasant   Type of Therapy: Individual Therapy  Treatment Goals addressed: Reduce frequency, intensity, and duration of depression symptoms so that daily functioning is improved AEB reduced episodes of symptoms from daily to 1 x per week Reduce overall depression score by a minimum of 50% on the Patient Health Questionnaire (PHQ-9  : Diane will practice behavioral activation skills 5 times per week for the next 26 weeks   ProgressTowards Goals:  progressing  Interventions: CBT and Supportive  Summary: Diane [name] is a 72 y.o. female who presents is referred for services by psychiatrist Dr. Tenny Craw due to pt experiencing anxiety and depression. She is a returning pt to this clinician and last was seen many years ago.  She denies any psychiatric hospitalizations.  Patient reports several stressors including financial issues along with grief and loss issues related to the death of her ex-husband as well as the death of her brother in 11-08-22.  Patient reports excessive worry, feeling overwhelmed, depressed mood, and tearfulness.  Patient last was seen about 2 weeks ago.  Patient continues to deny any symptoms of depression in the past 2 weeks as reflected in the PHQ 2.  She has continued to make successful efforts to continue behavioral activation.  Patient reports doing household tasks, attending church, and visiting friend as well as going Christmas shopping. However, she has experienced increased back pain as she recently overdid it doing house work per her report.  As a result, she has not been able to be as active as she would like.  She reports having to pace self.  She also has expresses some concern about having knee pain due to arthritis and the increasingly cold weather.  Patient reports she still has not started attending the gym but  wants to do so.  However, she reports being nervous about being in situations with people she does not know.  She fears she will be judged and people will say negative things about her.    Suicidal/Homicidal: Nowithout intent/plan patient agrees to call 911, [phone], or have someone take her to the ED should symptoms worsen.  Therapist Response: Reviewed symptoms, administered PHQ 2, discussed results, praised and reinforced patient's continued efforts to maintain behavioral activation, assisted patient identify ways to pace self and to choose activities within her capability, developed plan with patient to use daily planning and activity menu, assisted patient examine her thought patterns about attending the gym, assisted patient began to identify reasons to attend the gym, began to provide an overview on ways to assist patient decrease avoidant behavior, discussed using relaxation techniques/-challenging-replacing negative thought patterns/and exposure, provided psychoeducation regarding anxiety and the stress response, discussed rationale for and assisted patient practice deep breathing to trigger relaxation response, developed plan with patient to practice deep breathing 5 minutes 2 times per day between sessions    Plan: Return again in 2 weeks.  Diagnosis: Bipolar I disorder, most recent episode depressed (HCC)  Collaboration of Care: Psychiatrist AEB sees psychiatrist Dr. Tenny Craw in this practice  Patient/Guardian was advised Release of Information must be obtained prior to any record release in order to collaborate their care with an outside provider. Patient/Guardian was advised if they have not already done so to contact the registration department to sign all necessary forms in order for [place] to release information regarding their care.   Consent: Patient/Guardian gives  verbal consent for treatment and assignment of benefits for services provided during this visit. Patient/Guardian expressed  understanding and agreed to proceed.   Adah Salvage, LCSW 08/14/2023

## 2023-08-15 ENCOUNTER — Other Ambulatory Visit: Payer: Self-pay | Admitting: Orthopedic Surgery

## 2023-08-15 ENCOUNTER — Ambulatory Visit (HOSPITAL_COMMUNITY): Payer: 59 | Admitting: Psychiatry

## 2023-08-15 ENCOUNTER — Other Ambulatory Visit (HOSPITAL_COMMUNITY): Payer: Self-pay | Admitting: Psychiatry

## 2023-08-15 DIAGNOSIS — G5791 Unspecified mononeuropathy of right lower limb: Secondary | ICD-10-CM

## 2023-08-20 ENCOUNTER — Ambulatory Visit (INDEPENDENT_AMBULATORY_CARE_PROVIDER_SITE_OTHER): Payer: 59 | Admitting: Orthopedic Surgery

## 2023-08-20 ENCOUNTER — Other Ambulatory Visit (INDEPENDENT_AMBULATORY_CARE_PROVIDER_SITE_OTHER): Payer: 59

## 2023-08-20 ENCOUNTER — Other Ambulatory Visit: Payer: Self-pay | Admitting: Orthopedic Surgery

## 2023-08-20 DIAGNOSIS — G894 Chronic pain syndrome: Secondary | ICD-10-CM

## 2023-08-20 DIAGNOSIS — M79672 Pain in left foot: Secondary | ICD-10-CM

## 2023-08-21 ENCOUNTER — Encounter: Payer: Self-pay | Admitting: Orthopedic Surgery

## 2023-08-21 ENCOUNTER — Other Ambulatory Visit: Payer: Self-pay | Admitting: Orthopedic Surgery

## 2023-08-21 NOTE — Progress Notes (Signed)
Office Visit Note   Patient: Diane [name]           Date of Birth: [birth year]           MRN: [id number] Visit Date: 08/20/2023              Requested by: Elfredia Nevins, MD [address] [place],  [place] [address] PCP: Elfredia Nevins, MD  Chief Complaint  Patient presents with   Left Foot - Injury      HPI: Patient is a 72 year old woman who is seen for initial evaluation for blunt trauma left foot.  Patient states she dropped a pan on her foot about 2 weeks ago.  Patient complains of midfoot pain.  She states she has had some bruising and swelling and still has pain.  Assessment & Plan: Visit Diagnoses:  1. Pain in left foot     Plan: Recommended Voltaren gel and a stiff soled sneaker.  Adjust the laces so they are not crossing over the area of trauma.  Follow-Up Instructions: Return if symptoms worsen or fail to improve.   Ortho Exam  Patient is alert, oriented, no adenopathy, well-dressed, normal affect, normal respiratory effort. Examination of the left foot patient has a good dorsalis pedis pulse she does have varicose veins.  There is no ecchymosis no blisters no skin trauma.  There is no hematoma.  Patient is tender to palpation over the dorsum of the midfoot.  Imaging: XR Foot Complete Left  Result Date: 08/21/2023 Three-view radiographs of the left foot shows no air in the soft tissue.  No evidence of fracture.  No images are attached to the encounter.  Labs: Lab Results  Component Value Date   HGBA1C 5.5 02/19/2020   HGBA1C 5.8 (H) 01/25/2012   HGBA1C 5.7 07/13/2009   ESRSEDRATE 2 03/26/2018   ESRSEDRATE 4 03/16/2014   ESRSEDRATE 40 (H) 12/30/2012   CRP 0.8 03/26/2018   CRP <0.5 03/16/2014   CRP 1.6 (H) 12/30/2012   REPTSTATUS 08/13/2022 FINAL 08/10/2022   GRAMSTAIN  01/03/2013    RARE WBC PRESENT, PREDOMINANTLY MONONUCLEAR NO SQUAMOUS EPITHELIAL CELLS SEEN NO ORGANISMS SEEN   GRAMSTAIN  01/03/2013    RARE WBC PRESENT, PREDOMINANTLY  PMN NO SQUAMOUS EPITHELIAL CELLS SEEN NO ORGANISMS SEEN   GRAMSTAIN  01/03/2013    RARE WBC PRESENT,BOTH PMN AND MONONUCLEAR NO SQUAMOUS EPITHELIAL CELLS SEEN NO ORGANISMS SEEN   GRAMSTAIN  01/03/2013    RARE WBC PRESENT, PREDOMINANTLY PMN NO SQUAMOUS EPITHELIAL CELLS SEEN NO ORGANISMS SEEN   GRAMSTAIN  01/03/2013    RARE WBC PRESENT,BOTH PMN AND MONONUCLEAR NO SQUAMOUS EPITHELIAL CELLS SEEN NO ORGANISMS SEEN   GRAMSTAIN  01/03/2013    RARE WBC PRESENT, PREDOMINANTLY MONONUCLEAR NO SQUAMOUS EPITHELIAL CELLS SEEN NO ORGANISMS SEEN   CULT (A) 08/10/2022    >=100,000 COLONIES/mL ESCHERICHIA COLI 50,000 COLONIES/mL KLEBSIELLA PNEUMONIAE    LABORGA ESCHERICHIA COLI (A) 08/10/2022   LABORGA KLEBSIELLA PNEUMONIAE (A) 08/10/2022     Lab Results  Component Value Date   ALBUMIN 3.2 (L) 08/10/2022   ALBUMIN 3.5 02/18/2020   ALBUMIN 3.4 (L) 11/10/2019    Lab Results  Component Value Date   MG 1.9 08/12/2022   MG 1.9 08/10/2022   MG 1.7 02/18/2020   No results found for: "VD25OH"  No results found for: "PREALBUMIN"    Latest Ref Rng & Units 06/09/2023   10:43 PM 08/12/2022    4:08 AM 08/10/2022    1:18 PM  CBC EXTENDED  WBC  4.0 - 10.5 K/uL 4.8  4.5    RBC 3.87 - 5.11 MIL/uL 4.08  3.90    Hemoglobin 12.0 - 15.0 g/dL 13.0  86.5    HCT 78.4 - 46.0 % 37.0  35.6    Platelets 150 - 400 K/uL 159  200    NEUT# 1.7 - 7.7 K/uL 1.9   1.8   Lymph# 0.7 - 4.0 K/uL 2.2   2.2      There is no height or weight on file to calculate BMI.  Orders:  Orders Placed This Encounter  Procedures   XR Foot Complete Left   No orders of the defined types were placed in this encounter.    Procedures: No procedures performed  Clinical Data: No additional findings.  ROS:  All other systems negative, except as noted in the HPI. Review of Systems  Objective: Vital Signs: There were no vitals taken for this visit.  Specialty Comments:  No specialty comments available.  PMFS  History: Patient Active Problem List   Diagnosis Date Noted   S/P revision of total knee, right/patellofemoral joint 01/03/13 the TKR right was on 07/01/12 08/06/2023   Toxic metabolic encephalopathy 08/10/2022   Seizure (HCC) 08/10/2022   UTI (urinary tract infection) 08/10/2022   Primary osteoarthritis of right foot    Syncope 02/18/2020   Prolonged QT interval 02/18/2020   Glucose intolerance 02/18/2020   Normocytic anemia 12/10/2019   Poor appetite 09/24/2019   Abnormal weight loss 09/24/2019   Upper abdominal mass 09/24/2019   Dysphagia 01/30/2019   Simple adnexal cyst greater than 1 cm in diameter in postmenopausal patient 11/08/2018   Abdominal pain 08/09/2015   Dysphagia, pharyngoesophageal phase    H/O adenomatous polyp of colon 05/05/2015   Constipation 02/02/2015   Knee contusion 12/09/2013   Esophageal dysphagia 07/02/2013   Abdominal pain, epigastric 07/02/2013   Chronic pain syndrome 11/28/2012   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   Cellulitis 11/04/2012   Postoperative wound breakdown 11/04/2012   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   Knee pain 08/13/2012   Hypokalemia 08/01/2012   Cellulitis, wound, post-operative 07/30/2012   Degenerative tear of medial meniscus of right knee 01/12/2012   OA (osteoarthritis) of knee 01/12/2012   Right knee sprain 11/02/2011   Instability of knee joint 03/29/2011   URTICARIA 12/21/2009   OTHER URINARY INCONTINENCE 07/18/2009   FOOT PAIN, LEFT 05/26/2009   DERMATITIS, CHRONIC 10/21/2008   IMPAIRED GLUCOSE TOLERANCE 09/09/2008   Chronic back pain 09/09/2008   Hypothyroidism 02/27/2008   Hyperlipidemia 02/27/2008   Anxiety with depression 02/27/2008   Bipolar I disorder, most recent episode depressed (HCC) 02/27/2008   Essential hypertension 02/27/2008   Allergic rhinitis 02/27/2008   GERD 02/27/2008   Headache 02/27/2008   Past Medical History:  Diagnosis Date   Allergic rhinitis    Anxiety     Arthritis    Cataracts, bilateral    Chronic back pain    Sees pain mamagement clinic   Chronic pain syndrome    Constipation    Depression    Essential hypertension    GERD (gastroesophageal reflux disease)    Headaches, cluster    History of seizures    Hypothyroid    Mania (HCC)    Mixed hyperlipidemia    PTSD (post-traumatic stress disorder)    Right lumbar radiculopathy    Sleep apnea    STOP BANG score=4   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[facility])     Family History  Problem  Relation Age of Onset   Alcohol abuse Father    Anxiety disorder Father    Alcohol abuse Mother    Anxiety disorder Mother    Alcohol abuse Brother    Anxiety disorder Brother    Drug abuse Brother    Drug abuse Brother    Alcohol abuse Brother    Anxiety disorder Brother    Seizures Brother    Alcohol abuse Brother    Anxiety disorder Brother    Seizures Brother    Alcohol abuse Brother    Anxiety disorder Brother    Alcohol abuse Brother    Anxiety disorder Brother    Breast cancer Sister    Bipolar disorder Sister    Breast cancer Sister    Dementia Paternal Aunt    ADD / ADHD Grandchild    ADD / ADHD Grandchild    Heart disease Other    Diabetes Other    Alcohol abuse Other    Thyroid disease Son    Thyroid disease Daughter    Depression Neg Hx    OCD Neg Hx    Paranoid behavior Neg Hx    Schizophrenia Neg Hx    Sexual abuse Neg Hx    Physical abuse Neg Hx    Colon cancer Neg Hx     Past Surgical History:  Procedure Laterality Date   BACK SURGERY     BILATERAL OOPHORECTOMY     CATARACT EXTRACTION W/PHACO Left 01/04/2016   Procedure: CATARACT EXTRACTION PHACO AND INTRAOCULAR LENS PLACEMENT (IOC);  Surgeon: Jethro Bolus, MD;  Location: [facility];  Service: Ophthalmology;  Laterality: Left;  CDE:4.70   CATARACT EXTRACTION W/PHACO Right 01/18/2016   Procedure: CATARACT EXTRACTION PHACO AND INTRAOCULAR LENS PLACEMENT RIGHT EYE; CDE:  4.66;  Surgeon: Jethro Bolus, MD;  Location: [facility];   Service: Ophthalmology;  Laterality: Right;   CHOLECYSTECTOMY     COLONOSCOPY  10/16/2008   LKG:[id number], otherwise normal rectum; pancolonic diverticula the remainder of the colonic mucosa appeared normal. Next TCS due 10/2013.   COLONOSCOPY  06/28/2006   RMR: Internal hemorrhoids. Diminutive rectal polyps, cold biopsied/ Pancolonic diverticula. Polyps in the right colon removed    COLONOSCOPY WITH PROPOFOL N/A 05/20/2015   RMR: Pancolonic diverticulosis. Redundant colon    COLONOSCOPY WITH PROPOFOL N/A 11/11/2020   Procedure: COLONOSCOPY WITH PROPOFOL;  Surgeon: Corbin Ade, MD;  Location: [facility];  Service: Endoscopy;  Laterality: N/A;  pm ASA 2   ESOPHAGOGASTRODUODENOSCOPY  10/16/2008   YQI:HKVQQ hiatal hernia otherwise normal esophagus, stomach  D1, D2, status post passage of a 56-French Maloney dilator   ESOPHAGOGASTRODUODENOSCOPY  06/28/2006   VZD:GLOVFI esophagus. Small hiatal hernia. Otherwise normal stomach, D1 and D2, status post passage of a 56 French Maloney dilator   ESOPHAGOGASTRODUODENOSCOPY (EGD) WITH ESOPHAGEAL DILATION N/A 07/14/2013   RMR: Abnormal esophagus of uncertain significance-status post esophageal biospy. small hiatal hernia.    ESOPHAGOGASTRODUODENOSCOPY (EGD) WITH PROPOFOL N/A 05/20/2015   RMR: NORMAL EGD status post maloney dilation    ESOPHAGOGASTRODUODENOSCOPY (EGD) WITH PROPOFOL N/A 12/23/2018   Dr. Jena Gauss: normal esophagus, s/p esophageal dilation.    FOOT ARTHRODESIS Right 03/31/2022   Procedure: RIGHT FOOT TARSAL-METATARSAL FUSION;  Surgeon: Nadara Mustard, MD;  Location: [facility] OR;  Service: Orthopedics;  Laterality: Right;   FOOT SURGERY     left foot-   GASTROCNEMIUS RECESSION Right 03/31/2022   Procedure: RIGHT ACHILLES LENGTHENING;  Surgeon: Nadara Mustard, MD;  Location: [facility] OR;  Service: Orthopedics;  Laterality: Right;  INCISION AND DRAINAGE Right 01/03/2013   Procedure: INCISION AND DRAINAGE;  Surgeon: Vickki Hearing, MD;  Location: [facility];   Service: Orthopedics;  Laterality: Right;   KNEE ARTHROSCOPY  01/2012   right   KNEE ARTHROSCOPY WITH LATERAL RELEASE Right 01/03/2013   Procedure: Lateral Release Patella Right Knee;  Surgeon: Vickki Hearing, MD;  Location: [facility];  Service: Orthopedics;  Laterality: Right;   LAPAROSCOPIC LYSIS OF ADHESIONS N/A 11/12/2019   Procedure: LAPAROSCOPIC LYSIS OF ADHESIONS, extensive; drainage of peritoneal cyst;  Surgeon: Lazaro Arms, MD;  Location: [facility];  Service: Gynecology;  Laterality: N/A;   LUMBAR DISC SURGERY  L4-5   MALONEY DILATION N/A 05/20/2015   Procedure: Elease Hashimoto DILATION;  Surgeon: Corbin Ade, MD;  Location: [facility];  Service: Endoscopy;  Laterality: N/AElease Hashimoto dilator # 54   MALONEY DILATION N/A 12/23/2018   Procedure: Elease Hashimoto DILATION;  Surgeon: Corbin Ade, MD;  [facility];  Service: Endoscopy;  Laterality: N/A;   PARTIAL HYSTERECTOMY     TOTAL KNEE ARTHROPLASTY  07/01/2012   Procedure: TOTAL KNEE ARTHROPLASTY;  Surgeon: Vickki Hearing, MD;  Location: [facility];  Service: Orthopedics;  Laterality: Right;   TOTAL KNEE REVISION Right 01/03/2013   Procedure: Patellaplasty Right Knee;  Surgeon: Vickki Hearing, MD;  Location: [facility];  Service: Orthopedics;  Laterality: Right;   Social History   Occupational History   Occupation: disabled    Associate Professor: UNEMPLOYED  Tobacco Use   Smoking status: Former    Current packs/day: 0.00    Average packs/day: 0.3 packs/day for 5.0 years (1.3 ttl pk-yrs)    Types: Cigarettes    Start date: 06/25/1998    Quit date: 06/26/2003    Years since quitting: 20.1   Smokeless tobacco: Never  Vaping Use   Vaping status: Never Used  Substance and Sexual Activity   Alcohol use: Not Currently    Comment: occasionally; once a month.   Drug use: Not Currently    Comment: has a past history of street drug use   Sexual activity: Yes    Birth control/protection: Surgical    Comment: hyst

## 2023-08-27 ENCOUNTER — Encounter (HOSPITAL_COMMUNITY): Payer: Self-pay | Admitting: Psychiatry

## 2023-08-27 ENCOUNTER — Telehealth: Payer: Self-pay

## 2023-08-27 ENCOUNTER — Ambulatory Visit (INDEPENDENT_AMBULATORY_CARE_PROVIDER_SITE_OTHER): Payer: 59 | Admitting: Psychiatry

## 2023-08-27 DIAGNOSIS — F5105 Insomnia due to other mental disorder: Secondary | ICD-10-CM

## 2023-08-27 DIAGNOSIS — F3130 Bipolar disorder, current episode depressed, mild or moderate severity, unspecified: Secondary | ICD-10-CM | POA: Diagnosis not present

## 2023-08-27 MED ORDER — TRAZODONE HCL 150 MG PO TABS: 150.0000 mg | ORAL_TABLET | Freq: Every day | ORAL | 2 refills | Status: AC

## 2023-08-27 MED ORDER — ALPRAZOLAM 0.5 MG PO TABS
0.5000 mg | ORAL_TABLET | Freq: Three times a day (TID) | ORAL | 2 refills | Status: AC
Start: 2023-08-27 — End: ?

## 2023-08-27 MED ORDER — BUPROPION HCL ER (XL) 150 MG PO TB24: 150.0000 mg | ORAL_TABLET | Freq: Every morning | ORAL | 2 refills | Status: DC

## 2023-08-27 MED ORDER — DIVALPROEX SODIUM ER 500 MG PO TB24: 500.0000 mg | ORAL_TABLET | Freq: Every day | ORAL | 2 refills | Status: AC

## 2023-08-27 NOTE — Progress Notes (Signed)
Virtual Visit via Telephone Note  I connected with Diane [name] on 08/27/23 at  1:40 PM EST by telephone and verified that I am speaking with the correct person using two identifiers.  Location: Patient: home Provider: office   I discussed the limitations, risks, security and privacy concerns of performing an evaluation and management service by telephone and the availability of in person appointments. I also discussed with the patient that there may be a patient responsible charge related to this service. The patient expressed understanding and agreed to proceed.     I discussed the assessment and treatment plan with the patient. The patient was provided an opportunity to ask questions and all were answered. The patient agreed with the plan and demonstrated an understanding of the instructions.   The patient was advised to call back or seek an in-person evaluation if the symptoms worsen or if the condition fails to improve as anticipated.  I provided 20 minutes of non-face-to-face time during this encounter.   Diane Ruder, MD  [facility] MD/PA/NP OP Progress Note  08/27/2023 2:12 PM [name] [name]  MRN:  [id number]  Chief Complaint:  Chief Complaint  Patient presents with   Depression   Anxiety   Follow-up   HPI: This patient is a 72 year old black female who lives with her son in [place].  She is on disability.  The patient returns for follow-up after 3 months regarding her depression anxiety and possible bipolar disorder.  Last time we switched her to Wellbutrin and she is think she is doing much better.  Her mood is not quite as down.  Her anxiety is under pretty good control.  She is not sleeping that well with the trazodone 100 mg and asked to have an increased.  She did have some falls in September but has not had any since.  She is still dealing with chronic back and knee pain. Visit Diagnosis:    ICD-10-CM   1. Bipolar I disorder, most recent episode depressed (HCC)  F31.30      2. 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  At that time she was using drugs and alcohol.  She has been in outpatient care ever since   Past Medical History:  Past Medical History:  Diagnosis Date   Allergic rhinitis    Anxiety    Arthritis    Cataracts, bilateral    Chronic back pain    Sees pain mamagement clinic   Chronic pain syndrome    Constipation    Depression    Essential hypertension    GERD (gastroesophageal reflux disease)    Headaches, cluster    History of seizures    Hypothyroid    Mania (HCC)    Mixed hyperlipidemia    PTSD (post-traumatic stress disorder)    Right lumbar radiculopathy    Sleep apnea    STOP BANG score=4   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[facility])     Past Surgical History:  Procedure Laterality Date   BACK SURGERY     BILATERAL OOPHORECTOMY     CATARACT EXTRACTION W/PHACO Left 01/04/2016   Procedure: CATARACT EXTRACTION PHACO AND INTRAOCULAR LENS PLACEMENT (IOC);  Surgeon: Jethro Bolus, MD;  Location: [facility];  Service: Ophthalmology;  Laterality: Left;  CDE:4.70   CATARACT EXTRACTION W/PHACO Right 01/18/2016   Procedure: CATARACT EXTRACTION  PHACO AND INTRAOCULAR LENS PLACEMENT RIGHT EYE; CDE:  4.66;  Surgeon: Jethro Bolus, MD;  Location: [facility];  Service: Ophthalmology;  Laterality: Right;   CHOLECYSTECTOMY     COLONOSCOPY  10/16/2008   MVH:[id number], otherwise normal rectum; pancolonic diverticula the remainder of the colonic mucosa appeared normal. Next TCS due 10/2013.   COLONOSCOPY  06/28/2006   RMR: Internal hemorrhoids. Diminutive rectal polyps, cold biopsied/ Pancolonic diverticula. Polyps in the right colon removed    COLONOSCOPY WITH PROPOFOL N/A 05/20/2015   RMR: Pancolonic diverticulosis. Redundant colon    COLONOSCOPY WITH PROPOFOL N/A 11/11/2020   Procedure: COLONOSCOPY  WITH PROPOFOL;  Surgeon: Corbin Ade, MD;  [facility]: [facility];  Service: Endoscopy;  Laterality: N/A;  pm ASA 2   ESOPHAGOGASTRODUODENOSCOPY  10/16/2008   XLK:GMWNU hiatal hernia otherwise normal esophagus, stomach  D1, D2, status post passage of a 56-French Maloney dilator   ESOPHAGOGASTRODUODENOSCOPY  06/28/2006   UVO:[id number] esophagus. Small hiatal hernia. Otherwise normal stomach, D1 and D2, status post passage of a 56 French Maloney dilator   ESOPHAGOGASTRODUODENOSCOPY (EGD) WITH ESOPHAGEAL DILATION N/A 07/14/2013   RMR: Abnormal esophagus of uncertain significance-status post esophageal biospy. small hiatal hernia.    ESOPHAGOGASTRODUODENOSCOPY (EGD) WITH PROPOFOL N/A 05/20/2015   RMR: NORMAL EGD status post maloney dilation    ESOPHAGOGASTRODUODENOSCOPY (EGD) WITH PROPOFOL N/A 12/23/2018   Dr. Jena Gauss: normal esophagus, s/p esophageal dilation.    FOOT ARTHRODESIS Right 03/31/2022   Procedure: RIGHT FOOT TARSAL-METATARSAL FUSION;  Surgeon: Nadara Mustard, MD;  Location: [facility] OR;  Service: Orthopedics;  Laterality: Right;   FOOT SURGERY     left foot-   GASTROCNEMIUS RECESSION Right 03/31/2022   Procedure: RIGHT ACHILLES LENGTHENING;  Surgeon: Nadara Mustard, MD;  Location: [facility] OR;  Service: Orthopedics;  Laterality: Right;   INCISION AND DRAINAGE Right 01/03/2013   Procedure: INCISION AND DRAINAGE;  Surgeon: Vickki Hearing, MD;  Location: [facility];  Service: Orthopedics;  Laterality: Right;   KNEE ARTHROSCOPY  01/2012   right   KNEE ARTHROSCOPY WITH LATERAL RELEASE Right 01/03/2013   Procedure: Lateral Release Patella Right Knee;  Surgeon: Vickki Hearing, MD;  Location: [facility];  Service: Orthopedics;  Laterality: Right;   LAPAROSCOPIC LYSIS OF ADHESIONS N/A 11/12/2019   Procedure: LAPAROSCOPIC LYSIS OF ADHESIONS, extensive; drainage of peritoneal cyst;  Surgeon: Lazaro Arms, MD;  Location: [facility];  Service: Gynecology;  Laterality: N/A;   LUMBAR DISC SURGERY  L4-5   MALONEY DILATION  N/A 05/20/2015   Procedure: Elease Hashimoto DILATION;  Surgeon: Corbin Ade, MD;  Location: [facility];  Service: Endoscopy;  Laterality: N/AElease Hashimoto dilator # 54   MALONEY DILATION N/A 12/23/2018   Procedure: Elease Hashimoto DILATION;  Surgeon: Corbin Ade, MD;  [facility];  Service: Endoscopy;  Laterality: N/A;   PARTIAL HYSTERECTOMY     TOTAL KNEE ARTHROPLASTY  07/01/2012   Procedure: TOTAL KNEE ARTHROPLASTY;  Surgeon: Vickki Hearing, MD;  Location: [facility];  Service: Orthopedics;  Laterality: Right;   TOTAL KNEE REVISION Right 01/03/2013   Procedure: Patellaplasty Right Knee;  Surgeon: Vickki Hearing, MD;  Location: [facility];  Service: Orthopedics;  Laterality: Right;    Family Psychiatric History: See below  Family History:  Family History  Problem Relation Age of Onset   Alcohol abuse Father    Anxiety disorder Father    Alcohol abuse Mother    Anxiety disorder Mother    Alcohol abuse Brother    Anxiety  disorder Brother    Drug abuse Brother    Drug abuse Brother    Alcohol abuse Brother    Anxiety disorder Brother    Seizures Brother    Alcohol abuse Brother    Anxiety disorder Brother    Seizures Brother    Alcohol abuse Brother    Anxiety disorder Brother    Alcohol abuse Brother    Anxiety disorder Brother    Breast cancer Sister    Bipolar disorder Sister    Breast cancer Sister    Dementia Paternal Aunt    ADD / ADHD Grandchild    ADD / ADHD Grandchild    Heart disease Other    Diabetes Other    Alcohol abuse Other    Thyroid disease Son    Thyroid disease Daughter    Depression Neg Hx    OCD Neg Hx    Paranoid behavior Neg Hx    Schizophrenia Neg Hx    Sexual abuse Neg Hx    Physical abuse Neg Hx    Colon cancer Neg Hx     Social History:  Social History   Socioeconomic History   Marital status: Legally Separated    Spouse name: Not on file   Number of children: Not on file   Years of education: 12th grade   Highest education level: Not on  file  Occupational History   Occupation: disabled    Employer: UNEMPLOYED  Tobacco Use   Smoking status: Former    Current packs/day: 0.00    Average packs/day: 0.3 packs/day for 5.0 years (1.3 ttl pk-yrs)    Types: Cigarettes    Start date: 06/25/1998    Quit date: 06/26/2003    Years since quitting: 20.1   Smokeless tobacco: Never  Vaping Use   Vaping status: Never Used  Substance and Sexual Activity   Alcohol use: Not Currently    Comment: occasionally; once a month.   Drug use: Not Currently    Comment: has a past history of street drug use   Sexual activity: Yes    Birth control/protection: Surgical    Comment: hyst  Other Topics Concern   Not on file  Social History Narrative   Not on file   Social Drivers of Health   Financial Resource Strain: Low Risk  (12/13/2022)   Overall Financial Resource Strain (CARDIA)    Difficulty of Paying Living Expenses: Not very hard  Food Insecurity: No Food Insecurity (09/15/2022)   Hunger Vital Sign    Worried About Running Out of Food in the Last Year: Never true    Ran Out of Food in the Last Year: Never true  Transportation Needs: No Transportation Needs (12/13/2022)   PRAPARE - Administrator, Civil Service (Medical): No    Lack of Transportation (Non-Medical): No  Physical Activity: Insufficiently Active (09/15/2022)   Exercise Vital Sign    Days of Exercise per Week: 2 days    Minutes of Exercise per Session: 60 min  Stress: No Stress Concern Present (01/24/2022)   [employer] of Occupational Health - Occupational Stress Questionnaire    Feeling of Stress : Only a little  Social Connections: Socially Isolated (01/24/2022)   Social Connection and Isolation Panel [NHANES]    Frequency of Communication with Friends and Family: Once a week    Frequency of Social Gatherings with Friends and Family: Once a week    Attends Religious Services: 1 to 4 times per year  Active Member of Clubs or Organizations: No     Attends Banker Meetings: Never    Marital Status: Separated    Allergies:  Allergies  Allergen Reactions   Bacitra-Neomycin-Polymyxin-Hc     Other Reaction(s): Not available   Neomycin-Bacitracin Zn-Polymyx Other (See Comments)    Reaction: skin starts to become raw and peels off   Penicillins Hives   Sulfonamide Derivatives Hives and Swelling    Metabolic Disorder Labs: Lab Results  Component Value Date   HGBA1C 5.5 02/19/2020   MPG 111.15 02/19/2020   MPG 120 (H) 01/25/2012   No results found for: "PROLACTIN" Lab Results  Component Value Date   CHOL  12/10/2010    69        ATP III CLASSIFICATION:  <200     mg/dL   Desirable  324-401  mg/dL   Borderline High  >=027    mg/dL   High          TRIG 38 12/10/2010   HDL 28 (L) 12/10/2010   CHOLHDL 2.5 12/10/2010   VLDL 8 12/10/2010   LDLCALC  12/10/2010    33        Total Cholesterol/HDL:CHD Risk Coronary Heart Disease Risk Table                     Men   Women  1/2 Average Risk   3.4   3.3  Average Risk       5.0   4.4  2 X Average Risk   9.6   7.1  3 X Average Risk  23.4   11.0        Use the calculated Patient Ratio above and the CHD Risk Table to determine the patient's CHD Risk.        ATP III CLASSIFICATION (LDL):  <100     mg/dL   Optimal  253-664  mg/dL   Near or Above                    Optimal  130-159  mg/dL   Borderline  403-474  mg/dL   High  >259     mg/dL   Very High   LDLCALC 71 07/12/2009   Lab Results  Component Value Date   TSH 7.105 (H) 02/18/2020   TSH 0.52 02/11/2018    Therapeutic Level Labs: No results found for: "LITHIUM" Lab Results  Component Value Date   VALPROATE 38 (L) 08/10/2022   VALPROATE 70.7 09/24/2018   No results found for: "CBMZ"  Current Medications: Current Outpatient Medications  Medication Sig Dispense Refill   traZODone (DESYREL) 150 MG tablet Take 1 tablet (150 mg total) by mouth at bedtime. 30 tablet 2   ALPRAZolam (XANAX) 0.5 MG  tablet Take 1 tablet (0.5 mg total) by mouth 3 (three) times daily. 90 tablet 2   atorvastatin (LIPITOR) 20 MG tablet Take 20 mg by mouth daily.     buPROPion (WELLBUTRIN XL) 150 MG 24 hr tablet Take 1 tablet (150 mg total) by mouth every morning. 30 tablet 2   CONSTULOSE 10 GM/15ML solution TAKE 30 ML BY MOUTH TWICE DAILY 236 mL 0   cyclobenzaprine (FLEXERIL) 10 MG tablet TAKE 1 TABLET BY MOUTH THREE TIMES DAILY AS NEEDED FOR MUSCLE SPASMS 180 tablet 0   divalproex (DEPAKOTE ER) 500 MG 24 hr tablet Take 1 tablet (500 mg total) by mouth at bedtime. 30 tablet 2   gabapentin (NEURONTIN) 100 MG capsule TAKE  ONE CAPSULE BY MOUTH THREE TIMES DAILY 90 capsule 2   hydrocortisone (ANUSOL-HC) 2.5 % rectal cream Place 1 application rectally 2 (two) times daily. 30 g 1   IBU 800 MG tablet TAKE ONE TABLET BY MOUTH EVERY 8 HOURS AS NEEDED 90 tablet 1   levothyroxine (SYNTHROID) 100 MCG tablet Take 100 mcg by mouth daily before breakfast.     lisinopril (PRINIVIL,ZESTRIL) 10 MG tablet Take 10 mg by mouth at bedtime.      lubiprostone (AMITIZA) 24 MCG capsule TAKE ONE CAPSULE BY MOUTH TWICE DAILY WITH A MEAL 60 capsule 11   meloxicam (MOBIC) 7.5 MG tablet TAKE 1 TABLET BY MOUTH ONCE DAILY 180 tablet 1   Naldemedine Tosylate (SYMPROIC) 0.2 MG TABS Take 0.2 mg by mouth daily.     omeprazole (PRILOSEC) 20 MG capsule TAKE ONE CAPSULE BY MOUTH TWICE DAILY BEFORE A MEAL. 180 capsule 1   oxybutynin (DITROPAN-XL) 10 MG 24 hr tablet Take 10 mg by mouth at bedtime.     oxyCODONE-acetaminophen (PERCOCET) 10-325 MG tablet TAKE 1 TABLET BY MOUTH EVERY 4 HOURS AS NEEDED FOR PAIN 30 tablet 0   traZODone (DESYREL) 100 MG tablet Take 1 tablet (100 mg total) by mouth at bedtime. 30 tablet 2   No current facility-administered medications for this visit.     Musculoskeletal: Strength & Muscle Tone: within normal limits Gait & Station: normal Patient leans: N/A  Psychiatric Specialty Exam: Review of Systems   Musculoskeletal:  Positive for arthralgias and back pain.  Psychiatric/Behavioral:  Positive for sleep disturbance.   All other systems reviewed and are negative.   There were no vitals taken for this visit.There is no height or weight on file to calculate BMI.  General Appearance: Casual and Fairly Groomed  Eye Contact:  Good  Speech:  Clear and Coherent  Volume:  Normal  Mood:  Euthymic  Affect:  Congruent  Thought Process:  Goal Directed  Orientation:  Full (Time, Place, and Person)  Thought Content: Rumination   Suicidal Thoughts:  No  Homicidal Thoughts:  No  Memory:  Immediate;   Good Recent;   Good Remote;   Fair  Judgement:  Good  Insight:  Fair  Psychomotor Activity:  Normal  Concentration:  Concentration: Good and Attention Span: Good  Recall:  Good  Fund of Knowledge: Good  Language: Good  Akathisia:  No  Handed:  Right  AIMS (if indicated): not done  Assets:  Communication Skills Desire for Improvement Resilience Social Support  ADL's:  Intact  Cognition: WNL  Sleep:  Poor   Screenings: GAD-7    Loss adjuster, chartered Office Visit from 01/24/2022 in [facility] for [facility]  Total GAD-7 Score 16      PHQ2-9    Flowsheet Row Counselor from 08/14/2023 in [facility] [place] Counselor from 07/31/2023 in [facility] at [place] Counselor from 07/17/2023 in [facility] at [place] Office Visit from 05/21/2023 in [facility] at [place] Counselor from 04/26/2023 in [facility] at [facility] Total Score 0 0 0 5 2  PHQ-9 Total Score -- -- -- 14 13      Flowsheet Row ED from 06/09/2023 in [facility] Department at [facility] Office Visit from 05/21/2023 in [facility] at [place] Counselor from 11/01/2022 in [facility]al Health at [facility] RISK CATEGORY  No Risk No Risk No Risk        Assessment and Plan: This patient is a 72 year old female with a history of depression anxiety chronic pain and a remote history of substance abuse.  She is doing better in terms of mood on the Wellbutrin XL 150 mg every morning so this will be continued.  She will continue Depakote DR 500 mg daily for mood stabilization, Xanax 0.5 mg 3 times daily for anxiety.  Her trazodone will be increased to 150 mg at bedtime for sleep.  She will return to see me in 3 months  Collaboration of Care: Collaboration of Care: Referral or follow-up with counselor/therapist AEB patient will continue therapy with Florencia Reasons in our office  Patient/Guardian was advised Release of Information must be obtained prior to any record release in order to collaborate their care with an outside provider. Patient/Guardian was advised if they have not already done so to contact the registration department to sign all necessary forms in order for [place] to release information regarding their care.   Consent: Patient/Guardian gives verbal consent for treatment and assignment of benefits for services provided during this visit. Patient/Guardian expressed understanding and agreed to proceed.    Diane Ruder, MD 08/27/2023, 2:12 PM

## 2023-08-27 NOTE — Telephone Encounter (Signed)
VOB submitted for Durolane, bilateral knee  

## 2023-08-27 NOTE — Telephone Encounter (Signed)
-----   Message from [facility] Latoria L sent at 08/13/2023  2:07 PM EST ----- Regarding: hyluronic acid Patient is in need of hyluronic acid injections

## 2023-08-28 ENCOUNTER — Other Ambulatory Visit: Payer: Self-pay | Admitting: Orthopedic Surgery

## 2023-08-28 ENCOUNTER — Ambulatory Visit (INDEPENDENT_AMBULATORY_CARE_PROVIDER_SITE_OTHER): Payer: 59 | Admitting: Psychiatry

## 2023-08-28 DIAGNOSIS — F3130 Bipolar disorder, current episode depressed, mild or moderate severity, unspecified: Secondary | ICD-10-CM

## 2023-08-28 DIAGNOSIS — G894 Chronic pain syndrome: Secondary | ICD-10-CM

## 2023-08-28 NOTE — Progress Notes (Signed)
IN- PERSON  THERAPIST PROGRESS NOTE  Session Time: Tuesday 08/28/2023 10:14 AM -  10:49 AM   Participation Level: Active  Behavioral Response: CasualAlert/pleasant   Type of Therapy: Individual Therapy  Treatment Goals addressed: Reduce frequency, intensity, and duration of depression symptoms so that daily functioning is improved AEB reduced episodes of symptoms from daily to 1 x per week Reduce overall depression score by a minimum of 50% on the Patient Health Questionnaire (PHQ-9)    : [name] will practice behavioral activation skills 5 times per week for the next 26 weeks   ProgressTowards Goals:  progressing  Interventions: CBT and Supportive  Summary: Diane [name] is a 72 y.o. female who presents is referred for services by psychiatrist Dr. Tenny Craw due to pt experiencing anxiety and depression. She is a returning pt to this clinician and last was seen many years ago.  She denies any psychiatric hospitalizations.  Patient reports several stressors including financial issues along with grief and loss issues related to the death of her ex-husband as well as the death of her brother in November 09, 2022.  Patient reports excessive worry, feeling overwhelmed, depressed mood, and tearfulness.  Patient last was seen about 2 weeks ago.  Patient continues to deny any symptoms of depression in the past 2 weeks.  She has maintained involvement in activities and reports using daily planning.  She reports recently attending her grandson's basketball game and attending a friend's birthday party.  She reports experiencing increased stress and muscle tension as her friend's father recently died. Pt reports being supportive to friend while also being stressed by his grief. She reports practicing deep breathing to take care of self. She also has been practicing deep breathing regularly and reports this has been helpful.  She still wants to pursue going to the gym but remains nervous about doing this. She continues  to have negative thoughts about possible reaction from others at the gym and fears she will be judged.   Suicidal/Homicidal: Nowithout intent/plan patient agrees to call 911, [phone], or have someone take her to the ED should symptoms worsen.  Therapist Response: Reviewed symptoms, discussed stressors, facilitated expression of thoughts and feelings, validated feelings, praised and reinforced patient's use of breathing, discussed effects , praised and reinforced patient's continued efforts to maintain behavioral activation and her use of daily planning, discussed rationale for and assist patient practice a body scan meditation is a another relaxation technique, developed plan with patient to practice deep breathing or body scan meditation daily, checked out interactive audio activity and provided access code to assist patient in her efforts,  began to assist patient identify her thoughts about attending the gym, began to discuss possible steps in starting to attend the gym, discussed driving to the gym parking lot.   Plan: Return again in 2 weeks.  Diagnosis: Bipolar I disorder, most recent episode depressed (HCC)  Collaboration of Care: Psychiatrist AEB sees psychiatrist Dr. Tenny Craw in this practice  Patient/Guardian was advised Release of Information must be obtained prior to any record release in order to collaborate their care with an outside provider. Patient/Guardian was advised if they have not already done so to contact the registration department to sign all necessary forms in order for [place] to release information regarding their care.   Consent: Patient/Guardian gives verbal consent for treatment and assignment of benefits for services provided during this visit. Patient/Guardian expressed understanding and agreed to proceed.   Adah Salvage, LCSW 08/28/2023

## 2023-08-29 ENCOUNTER — Telehealth: Payer: Self-pay | Admitting: Orthopedic Surgery

## 2023-08-29 NOTE — Telephone Encounter (Signed)
duplicate

## 2023-08-29 NOTE — Telephone Encounter (Signed)
 Dr. Mort Sawyers pt - spoke w/the patient, she is requesting a refill on Oxycodone 10-325, 30 tablets, every 4 hours PRN to be sent to [facility].

## 2023-09-03 ENCOUNTER — Telehealth: Payer: Self-pay

## 2023-09-03 ENCOUNTER — Other Ambulatory Visit: Payer: Self-pay | Admitting: Orthopedic Surgery

## 2023-09-03 ENCOUNTER — Other Ambulatory Visit: Payer: Self-pay

## 2023-09-03 DIAGNOSIS — M1712 Unilateral primary osteoarthritis, left knee: Secondary | ICD-10-CM

## 2023-09-03 DIAGNOSIS — G894 Chronic pain syndrome: Secondary | ICD-10-CM

## 2023-09-03 DIAGNOSIS — Z96651 Presence of right artificial knee joint: Secondary | ICD-10-CM

## 2023-09-03 NOTE — Telephone Encounter (Signed)
Please schedule patient an appointment for gel injection with Dr. Romeo Apple.  All information has been added under the referrals tab.

## 2023-09-07 DIAGNOSIS — I10 Essential (primary) hypertension: Secondary | ICD-10-CM | POA: Diagnosis not present

## 2023-09-07 DIAGNOSIS — H8110 Benign paroxysmal vertigo, unspecified ear: Secondary | ICD-10-CM | POA: Diagnosis not present

## 2023-09-07 DIAGNOSIS — R296 Repeated falls: Secondary | ICD-10-CM | POA: Diagnosis not present

## 2023-09-07 DIAGNOSIS — G8929 Other chronic pain: Secondary | ICD-10-CM | POA: Diagnosis not present

## 2023-09-07 DIAGNOSIS — Z0001 Encounter for general adult medical examination with abnormal findings: Secondary | ICD-10-CM | POA: Diagnosis not present

## 2023-09-07 DIAGNOSIS — H1010 Acute atopic conjunctivitis, unspecified eye: Secondary | ICD-10-CM | POA: Diagnosis not present

## 2023-09-07 DIAGNOSIS — K219 Gastro-esophageal reflux disease without esophagitis: Secondary | ICD-10-CM | POA: Diagnosis not present

## 2023-09-07 DIAGNOSIS — E039 Hypothyroidism, unspecified: Secondary | ICD-10-CM | POA: Diagnosis not present

## 2023-09-10 ENCOUNTER — Other Ambulatory Visit: Payer: Self-pay | Admitting: Orthopedic Surgery

## 2023-09-10 DIAGNOSIS — G894 Chronic pain syndrome: Secondary | ICD-10-CM

## 2023-09-10 NOTE — Telephone Encounter (Signed)
Tried calling patient to discuss 20% OOP that she would be responsible for, but no answer and not able to leave a message due to VM not being set up. We don't know exactly how much it will be until a claim is submitted to her insurance.  I will have to submit for new VOB if she is scheduled after the new year.

## 2023-09-10 NOTE — Telephone Encounter (Signed)
Noted! Thank you

## 2023-09-13 ENCOUNTER — Other Ambulatory Visit (HOSPITAL_COMMUNITY): Payer: Self-pay | Admitting: Psychiatry

## 2023-09-17 ENCOUNTER — Other Ambulatory Visit (HOSPITAL_COMMUNITY): Payer: Self-pay | Admitting: Psychiatry

## 2023-09-17 ENCOUNTER — Other Ambulatory Visit: Payer: Self-pay | Admitting: Orthopedic Surgery

## 2023-09-17 DIAGNOSIS — G894 Chronic pain syndrome: Secondary | ICD-10-CM

## 2023-09-18 NOTE — Telephone Encounter (Signed)
 Spoke w/the patient, she is moving to [place], [place] at the end of the month.  She no longer wishes to pursue injections here.  Once she is there and settled, she will call us  back to see if Dr. Margrette has any recommendations an ortho doctor there.  She requested her records, I transferred her to Tammy to request those.

## 2023-09-19 ENCOUNTER — Telehealth: Payer: Self-pay | Admitting: Orthopedic Surgery

## 2023-09-19 NOTE — Telephone Encounter (Signed)
 Received vm from patient. I tried calling, no, vm not set up. Patient needs to complete auth for records.

## 2023-09-24 DIAGNOSIS — E039 Hypothyroidism, unspecified: Secondary | ICD-10-CM | POA: Diagnosis not present

## 2023-09-24 DIAGNOSIS — Z0001 Encounter for general adult medical examination with abnormal findings: Secondary | ICD-10-CM | POA: Diagnosis not present

## 2023-09-25 ENCOUNTER — Telehealth: Payer: Self-pay | Admitting: Orthopedic Surgery

## 2023-09-25 DIAGNOSIS — G894 Chronic pain syndrome: Secondary | ICD-10-CM

## 2023-09-25 NOTE — Telephone Encounter (Signed)
 Dr. Mort Sawyers pt - spoke w/the pt, she is requesting a refill on Oxycodone 10-325, 30 tablets, every 4 hours PRN to be sent to [facility]

## 2023-09-26 DIAGNOSIS — R42 Dizziness and giddiness: Secondary | ICD-10-CM | POA: Diagnosis not present

## 2023-09-26 DIAGNOSIS — W19XXXA Unspecified fall, initial encounter: Secondary | ICD-10-CM | POA: Diagnosis not present

## 2023-09-26 DIAGNOSIS — R220 Localized swelling, mass and lump, head: Secondary | ICD-10-CM | POA: Diagnosis not present

## 2023-09-26 DIAGNOSIS — M1611 Unilateral primary osteoarthritis, right hip: Secondary | ICD-10-CM | POA: Diagnosis not present

## 2023-09-26 DIAGNOSIS — Z87891 Personal history of nicotine dependence: Secondary | ICD-10-CM | POA: Diagnosis not present

## 2023-09-26 DIAGNOSIS — S01511A Laceration without foreign body of lip, initial encounter: Secondary | ICD-10-CM | POA: Diagnosis not present

## 2023-09-26 DIAGNOSIS — M160 Bilateral primary osteoarthritis of hip: Secondary | ICD-10-CM | POA: Diagnosis not present

## 2023-09-26 DIAGNOSIS — Z882 Allergy status to sulfonamides status: Secondary | ICD-10-CM | POA: Diagnosis not present

## 2023-09-26 DIAGNOSIS — Z880 Allergy status to penicillin: Secondary | ICD-10-CM | POA: Diagnosis not present

## 2023-09-26 DIAGNOSIS — R6889 Other general symptoms and signs: Secondary | ICD-10-CM | POA: Diagnosis not present

## 2023-09-26 DIAGNOSIS — R519 Headache, unspecified: Secondary | ICD-10-CM | POA: Diagnosis not present

## 2023-09-26 DIAGNOSIS — I10 Essential (primary) hypertension: Secondary | ICD-10-CM | POA: Diagnosis not present

## 2023-09-26 DIAGNOSIS — W1839XA Other fall on same level, initial encounter: Secondary | ICD-10-CM | POA: Diagnosis not present

## 2023-09-26 DIAGNOSIS — M25551 Pain in right hip: Secondary | ICD-10-CM | POA: Diagnosis not present

## 2023-09-26 DIAGNOSIS — R7989 Other specified abnormal findings of blood chemistry: Secondary | ICD-10-CM | POA: Diagnosis not present

## 2023-09-26 DIAGNOSIS — Z743 Need for continuous supervision: Secondary | ICD-10-CM | POA: Diagnosis not present

## 2023-09-26 DIAGNOSIS — M25569 Pain in unspecified knee: Secondary | ICD-10-CM | POA: Diagnosis not present

## 2023-09-27 MED ORDER — OXYCODONE-ACETAMINOPHEN 10-325 MG PO TABS: 1.0000 | ORAL_TABLET | ORAL | 0 refills | Status: DC | PRN

## 2023-09-28 DIAGNOSIS — I10 Essential (primary) hypertension: Secondary | ICD-10-CM | POA: Diagnosis not present

## 2023-09-28 DIAGNOSIS — S0990XA Unspecified injury of head, initial encounter: Secondary | ICD-10-CM | POA: Diagnosis not present

## 2023-09-28 DIAGNOSIS — R296 Repeated falls: Secondary | ICD-10-CM | POA: Diagnosis not present

## 2023-09-28 DIAGNOSIS — K219 Gastro-esophageal reflux disease without esophagitis: Secondary | ICD-10-CM | POA: Diagnosis not present

## 2023-09-28 DIAGNOSIS — H8110 Benign paroxysmal vertigo, unspecified ear: Secondary | ICD-10-CM | POA: Diagnosis not present

## 2023-10-02 ENCOUNTER — Ambulatory Visit (INDEPENDENT_AMBULATORY_CARE_PROVIDER_SITE_OTHER): Payer: 59 | Admitting: Psychiatry

## 2023-10-02 DIAGNOSIS — F3130 Bipolar disorder, current episode depressed, mild or moderate severity, unspecified: Secondary | ICD-10-CM | POA: Diagnosis not present

## 2023-10-02 NOTE — Progress Notes (Signed)
Virtual Visit via Telephone Note  I connected with Diane [name] on 10/02/23 at 2:10 PM EST  by telephone and verified that I am speaking with the correct person using two identifiers.  Location: Patient: Home Provider: [facility] [place] office    I discussed the limitations, risks, security and privacy concerns of performing an evaluation and management service by telephone and the availability of in person appointments. I also discussed with the patient that there may be a patient responsible charge related to this service. The patient expressed understanding and agreed to proceed.     Adah Salvage, LCSW IN- PERSON  THERAPIST PROGRESS NOTE  Session Time: Tuesday 10/01/2022 2:10 PM - 2:30 PM   Participation Level: Active  Behavioral Response: CasualAlert/pleasant   Type of Therapy: Individual Therapy  Treatment Goals addressed: Reduce frequency, intensity, and duration of depression symptoms so that daily functioning is improved AEB reduced episodes of symptoms from daily to 1 x per week Reduce overall depression score by a minimum of 50% on the Patient Health Questionnaire (PHQ-9)    : Diane [name] will practice behavioral activation skills 5 times per week for the next 26 weeks   ProgressTowards Goals:  progressing  Interventions: CBT and Supportive  Summary: Diane [name] is a 73 y.o. female who presents is referred for services by psychiatrist Dr. Tenny Craw due to pt experiencing anxiety and depression. She is a returning pt to this clinician and last was seen many years ago.  She denies any psychiatric hospitalizations.  Patient reports several stressors including financial issues along with grief and loss issues related to the death of her ex-husband as well as the death of her brother in 2022/11/17.  Patient reports excessive worry, feeling overwhelmed, depressed mood, and tearfulness.  Patient last was seen about 4  weeks ago.  Patient continues to deny any symptoms of  depression since last session. She reports enjoying Christmas and states visiting family members during that time. She reports she hasn't been out that much recently due to the cold weather. However, she has maintained involvement in activity inside her home. She has been trying to pack as she is planning to move to [place] the beginning of next month. She has contemplated this for years but says she finally made a decision around mid December. She is looking forward to residing with her granddaughter and being near other family members. She also is looking forward to the birth of great grandchild in May. She reports strong support from granddaughter.    Suicidal/Homicidal: Nowithout intent/plan patient agrees to call 911, [phone], or have someone take her to the ED should symptoms worsen.  Therapist Response: Reviewed symptoms, praised and reinforced pt's continued involvement in activity, processed pt's feelings about moving to [place], encouraged pt to contact behavioral health providers in [facility] to maintain continuity of care, did termination. Plan: Return again in 2 weeks.  Diagnosis: Bipolar I disorder, most recent episode depressed (HCC)  Collaboration of Care: Psychiatrist AEB sees psychiatrist Dr. Tenny Craw in this practice  Patient/Guardian was advised Release of Information must be obtained prior to any record release in order to collaborate their care with an outside provider. Patient/Guardian was advised if they have not already done so to contact the registration department to sign all necessary forms in order for [place] to release information regarding their care.   Consent: Patient/Guardian gives verbal consent for treatment and assignment of benefits for services provided during this visit. Patient/Guardian expressed understanding and agreed  to proceed.   Adah Salvage, LCSW 10/02/2023   Outpatient Therapist Discharge Summary  Diane [name]    1951-05-20   Admission Date:  11/01/2022 Discharge Date:  10/02/2023 Reason for Discharge: Pt is moving to another state  Diagnosis:  Axis I:  Bipolar I disorder, most recent episode depressed [facility])   Comments:  Pt is encouraged to contact Behavioral Health Providers to maintain continuity of care.   Shantell Belongia E Anise Harbin LCSW

## 2023-10-03 ENCOUNTER — Other Ambulatory Visit: Payer: Self-pay | Admitting: Orthopedic Surgery

## 2023-10-03 DIAGNOSIS — G894 Chronic pain syndrome: Secondary | ICD-10-CM

## 2023-10-03 DIAGNOSIS — K219 Gastro-esophageal reflux disease without esophagitis: Secondary | ICD-10-CM | POA: Diagnosis not present

## 2023-10-03 DIAGNOSIS — Z556 Problems related to health literacy: Secondary | ICD-10-CM | POA: Diagnosis not present

## 2023-10-03 DIAGNOSIS — Z9181 History of falling: Secondary | ICD-10-CM | POA: Diagnosis not present

## 2023-10-03 DIAGNOSIS — Z87891 Personal history of nicotine dependence: Secondary | ICD-10-CM | POA: Diagnosis not present

## 2023-10-03 DIAGNOSIS — K5903 Drug induced constipation: Secondary | ICD-10-CM | POA: Diagnosis not present

## 2023-10-03 DIAGNOSIS — I10 Essential (primary) hypertension: Secondary | ICD-10-CM | POA: Diagnosis not present

## 2023-10-03 DIAGNOSIS — S0990XD Unspecified injury of head, subsequent encounter: Secondary | ICD-10-CM | POA: Diagnosis not present

## 2023-10-03 DIAGNOSIS — H1010 Acute atopic conjunctivitis, unspecified eye: Secondary | ICD-10-CM | POA: Diagnosis not present

## 2023-10-03 DIAGNOSIS — Z791 Long term (current) use of non-steroidal anti-inflammatories (NSAID): Secondary | ICD-10-CM | POA: Diagnosis not present

## 2023-10-03 DIAGNOSIS — M47812 Spondylosis without myelopathy or radiculopathy, cervical region: Secondary | ICD-10-CM | POA: Diagnosis not present

## 2023-10-03 DIAGNOSIS — G8929 Other chronic pain: Secondary | ICD-10-CM | POA: Diagnosis not present

## 2023-10-03 DIAGNOSIS — E039 Hypothyroidism, unspecified: Secondary | ICD-10-CM | POA: Diagnosis not present

## 2023-10-03 DIAGNOSIS — R296 Repeated falls: Secondary | ICD-10-CM | POA: Diagnosis not present

## 2023-10-03 DIAGNOSIS — H8110 Benign paroxysmal vertigo, unspecified ear: Secondary | ICD-10-CM | POA: Diagnosis not present

## 2023-10-04 DIAGNOSIS — Z791 Long term (current) use of non-steroidal anti-inflammatories (NSAID): Secondary | ICD-10-CM | POA: Diagnosis not present

## 2023-10-04 DIAGNOSIS — H1010 Acute atopic conjunctivitis, unspecified eye: Secondary | ICD-10-CM | POA: Diagnosis not present

## 2023-10-04 DIAGNOSIS — H8110 Benign paroxysmal vertigo, unspecified ear: Secondary | ICD-10-CM | POA: Diagnosis not present

## 2023-10-04 DIAGNOSIS — Z556 Problems related to health literacy: Secondary | ICD-10-CM | POA: Diagnosis not present

## 2023-10-04 DIAGNOSIS — K219 Gastro-esophageal reflux disease without esophagitis: Secondary | ICD-10-CM | POA: Diagnosis not present

## 2023-10-04 DIAGNOSIS — Z9181 History of falling: Secondary | ICD-10-CM | POA: Diagnosis not present

## 2023-10-04 DIAGNOSIS — Z87891 Personal history of nicotine dependence: Secondary | ICD-10-CM | POA: Diagnosis not present

## 2023-10-04 DIAGNOSIS — I10 Essential (primary) hypertension: Secondary | ICD-10-CM | POA: Diagnosis not present

## 2023-10-04 DIAGNOSIS — G8929 Other chronic pain: Secondary | ICD-10-CM | POA: Diagnosis not present

## 2023-10-04 DIAGNOSIS — K5903 Drug induced constipation: Secondary | ICD-10-CM | POA: Diagnosis not present

## 2023-10-04 DIAGNOSIS — M47812 Spondylosis without myelopathy or radiculopathy, cervical region: Secondary | ICD-10-CM | POA: Diagnosis not present

## 2023-10-04 DIAGNOSIS — E039 Hypothyroidism, unspecified: Secondary | ICD-10-CM | POA: Diagnosis not present

## 2023-10-04 DIAGNOSIS — R296 Repeated falls: Secondary | ICD-10-CM | POA: Diagnosis not present

## 2023-10-04 DIAGNOSIS — S0990XD Unspecified injury of head, subsequent encounter: Secondary | ICD-10-CM | POA: Diagnosis not present

## 2023-10-05 ENCOUNTER — Telehealth: Payer: Self-pay | Admitting: Orthopedic Surgery

## 2023-10-05 NOTE — Telephone Encounter (Signed)
ERROR

## 2023-10-09 ENCOUNTER — Other Ambulatory Visit: Payer: Self-pay | Admitting: Orthopedic Surgery

## 2023-10-09 DIAGNOSIS — H8110 Benign paroxysmal vertigo, unspecified ear: Secondary | ICD-10-CM | POA: Diagnosis not present

## 2023-10-09 DIAGNOSIS — Z87891 Personal history of nicotine dependence: Secondary | ICD-10-CM | POA: Diagnosis not present

## 2023-10-09 DIAGNOSIS — E039 Hypothyroidism, unspecified: Secondary | ICD-10-CM | POA: Diagnosis not present

## 2023-10-09 DIAGNOSIS — I10 Essential (primary) hypertension: Secondary | ICD-10-CM | POA: Diagnosis not present

## 2023-10-09 DIAGNOSIS — H1010 Acute atopic conjunctivitis, unspecified eye: Secondary | ICD-10-CM | POA: Diagnosis not present

## 2023-10-09 DIAGNOSIS — M47812 Spondylosis without myelopathy or radiculopathy, cervical region: Secondary | ICD-10-CM | POA: Diagnosis not present

## 2023-10-09 DIAGNOSIS — R296 Repeated falls: Secondary | ICD-10-CM | POA: Diagnosis not present

## 2023-10-09 DIAGNOSIS — Z791 Long term (current) use of non-steroidal anti-inflammatories (NSAID): Secondary | ICD-10-CM | POA: Diagnosis not present

## 2023-10-09 DIAGNOSIS — K219 Gastro-esophageal reflux disease without esophagitis: Secondary | ICD-10-CM | POA: Diagnosis not present

## 2023-10-09 DIAGNOSIS — K5903 Drug induced constipation: Secondary | ICD-10-CM | POA: Diagnosis not present

## 2023-10-09 DIAGNOSIS — Z9181 History of falling: Secondary | ICD-10-CM | POA: Diagnosis not present

## 2023-10-09 DIAGNOSIS — S0990XD Unspecified injury of head, subsequent encounter: Secondary | ICD-10-CM | POA: Diagnosis not present

## 2023-10-09 DIAGNOSIS — G894 Chronic pain syndrome: Secondary | ICD-10-CM

## 2023-10-09 DIAGNOSIS — G8929 Other chronic pain: Secondary | ICD-10-CM | POA: Diagnosis not present

## 2023-10-09 DIAGNOSIS — Z556 Problems related to health literacy: Secondary | ICD-10-CM | POA: Diagnosis not present

## 2023-10-11 ENCOUNTER — Telehealth: Payer: Self-pay | Admitting: Orthopedic Surgery

## 2023-10-11 NOTE — Telephone Encounter (Signed)
Tried returning the pts call, vm not setup and no answer on the other #

## 2023-10-12 ENCOUNTER — Other Ambulatory Visit: Payer: Self-pay | Admitting: Orthopedic Surgery

## 2023-10-12 DIAGNOSIS — K219 Gastro-esophageal reflux disease without esophagitis: Secondary | ICD-10-CM | POA: Diagnosis not present

## 2023-10-12 DIAGNOSIS — I10 Essential (primary) hypertension: Secondary | ICD-10-CM | POA: Diagnosis not present

## 2023-10-12 DIAGNOSIS — M47812 Spondylosis without myelopathy or radiculopathy, cervical region: Secondary | ICD-10-CM | POA: Diagnosis not present

## 2023-10-12 DIAGNOSIS — K5903 Drug induced constipation: Secondary | ICD-10-CM | POA: Diagnosis not present

## 2023-10-12 DIAGNOSIS — Z556 Problems related to health literacy: Secondary | ICD-10-CM | POA: Diagnosis not present

## 2023-10-12 DIAGNOSIS — H8110 Benign paroxysmal vertigo, unspecified ear: Secondary | ICD-10-CM | POA: Diagnosis not present

## 2023-10-12 DIAGNOSIS — S93601A Unspecified sprain of right foot, initial encounter: Secondary | ICD-10-CM

## 2023-10-12 DIAGNOSIS — E039 Hypothyroidism, unspecified: Secondary | ICD-10-CM | POA: Diagnosis not present

## 2023-10-12 DIAGNOSIS — G894 Chronic pain syndrome: Secondary | ICD-10-CM

## 2023-10-12 DIAGNOSIS — Z87891 Personal history of nicotine dependence: Secondary | ICD-10-CM | POA: Diagnosis not present

## 2023-10-12 DIAGNOSIS — Z9181 History of falling: Secondary | ICD-10-CM | POA: Diagnosis not present

## 2023-10-12 DIAGNOSIS — S0990XD Unspecified injury of head, subsequent encounter: Secondary | ICD-10-CM | POA: Diagnosis not present

## 2023-10-12 DIAGNOSIS — R296 Repeated falls: Secondary | ICD-10-CM | POA: Diagnosis not present

## 2023-10-12 DIAGNOSIS — S93491A Sprain of other ligament of right ankle, initial encounter: Secondary | ICD-10-CM

## 2023-10-12 DIAGNOSIS — Z791 Long term (current) use of non-steroidal anti-inflammatories (NSAID): Secondary | ICD-10-CM | POA: Diagnosis not present

## 2023-10-12 DIAGNOSIS — H1010 Acute atopic conjunctivitis, unspecified eye: Secondary | ICD-10-CM | POA: Diagnosis not present

## 2023-10-12 DIAGNOSIS — G8929 Other chronic pain: Secondary | ICD-10-CM | POA: Diagnosis not present

## 2023-10-12 NOTE — Telephone Encounter (Signed)
[facility] [address] [place] [address]

## 2023-10-12 NOTE — Telephone Encounter (Signed)
DR. Romeo Apple   [facility] [place], [place]. She is moving there permanently.   oxyCODONE-acetaminophen (PERCOCET) 10-325 MG tablet   cyclobenzaprine (FLEXERIL) 10 MG tablet   gabapentin (NEURONTIN) 100 MG capsule   IBU 800 MG tablet   meloxicam (MOBIC) 7.5 MG tablet   [facility]   She doesn't know the address she will call back and let us know.  She wants all her medicine transferred there.  I did advise her if they are not due to be filled they won't

## 2023-10-15 NOTE — Telephone Encounter (Signed)
I can not find that  Is that [facility] ?

## 2023-10-16 ENCOUNTER — Other Ambulatory Visit: Payer: Self-pay | Admitting: Orthopedic Surgery

## 2023-10-16 ENCOUNTER — Ambulatory Visit (HOSPITAL_COMMUNITY): Payer: 59 | Admitting: Psychiatry

## 2023-10-16 DIAGNOSIS — G894 Chronic pain syndrome: Secondary | ICD-10-CM

## 2023-10-16 NOTE — Telephone Encounter (Signed)
Dr. Mort Sawyers pt - spoke w/the pt, she is requesting a refill on Oxycodone 10-325, 30 tablets, every 4 hours PRN [facility] [address] [place], [place], [place] [address] - pt's # [phone]

## 2023-10-17 ENCOUNTER — Other Ambulatory Visit: Payer: Self-pay | Admitting: Orthopedic Surgery

## 2023-10-17 DIAGNOSIS — G894 Chronic pain syndrome: Secondary | ICD-10-CM

## 2023-10-17 MED ORDER — OXYCODONE-ACETAMINOPHEN 10-325 MG PO TABS: 1.0000 | ORAL_TABLET | ORAL | 0 refills | Status: DC | PRN

## 2023-10-17 NOTE — Telephone Encounter (Signed)
 Thanks I was waiting for the pharmacy address

## 2023-10-18 MED ORDER — OXYCODONE-ACETAMINOPHEN 10-325 MG PO TABS: 1.0000 | ORAL_TABLET | ORAL | 0 refills | Status: DC | PRN

## 2023-10-29 ENCOUNTER — Other Ambulatory Visit: Payer: Self-pay | Admitting: Orthopedic Surgery

## 2023-10-30 ENCOUNTER — Ambulatory Visit (HOSPITAL_COMMUNITY): Payer: 59 | Admitting: Psychiatry

## 2023-11-01 ENCOUNTER — Telehealth: Payer: Self-pay | Admitting: Orthopedic Surgery

## 2023-11-01 ENCOUNTER — Other Ambulatory Visit: Payer: Self-pay | Admitting: Orthopedic Surgery

## 2023-11-01 ENCOUNTER — Other Ambulatory Visit (HOSPITAL_COMMUNITY): Payer: Self-pay | Admitting: Psychiatry

## 2023-11-01 DIAGNOSIS — G894 Chronic pain syndrome: Secondary | ICD-10-CM

## 2023-11-01 MED ORDER — OXYCODONE-ACETAMINOPHEN 10-325 MG PO TABS: 1.0000 | ORAL_TABLET | ORAL | 0 refills | Status: AC | PRN

## 2023-11-01 NOTE — Telephone Encounter (Signed)
Dr. Mort Sawyers pt - spoke w/the pt, she is requesting a refill on Oxycodone 10-325, 30 tablets, e very 4 hours PRN to [facility]  [facility] [address] [place], [place] - [address] [place], [place] [address] Phone: [phone]  Fax: [phone]

## 2023-11-03 ENCOUNTER — Other Ambulatory Visit: Payer: Self-pay | Admitting: Orthopedic Surgery

## 2023-11-05 MED ORDER — CYCLOBENZAPRINE HCL 10 MG PO TABS: 10.0000 mg | ORAL_TABLET | Freq: Three times a day (TID) | ORAL | 0 refills | Status: DC | PRN

## 2023-11-08 ENCOUNTER — Telehealth: Payer: Self-pay | Admitting: Orthopedic Surgery

## 2023-11-08 NOTE — Telephone Encounter (Signed)
 Dr. Mort Sawyers pt - spoke w/the pt, she is requesting a refill on Oxycodone 10-325, 30 tablets, every 4hrs PRN [facility] [address] [place], [place] - [address] [place], [place] [address] Phone: [phone]  Fax: [phone]

## 2023-11-12 NOTE — Telephone Encounter (Signed)
 Dr. Mort Sawyers pt - Diane [name] [phone], pt's niece lvm on 3/02 at 12:23pm requesting to speak to Dr. Romeo Apple regarding the patient's medication.

## 2023-11-13 ENCOUNTER — Ambulatory Visit (HOSPITAL_COMMUNITY): Payer: 59 | Admitting: Psychiatry

## 2023-11-22 ENCOUNTER — Other Ambulatory Visit: Payer: Self-pay | Admitting: Orthopedic Surgery

## 2023-11-22 DIAGNOSIS — G5791 Unspecified mononeuropathy of right lower limb: Secondary | ICD-10-CM

## 2023-11-26 ENCOUNTER — Ambulatory Visit (HOSPITAL_COMMUNITY): Payer: 59 | Admitting: Psychiatry

## 2024-01-21 ENCOUNTER — Other Ambulatory Visit: Payer: Self-pay | Admitting: Orthopedic Surgery

## 2024-02-08 ENCOUNTER — Other Ambulatory Visit (HOSPITAL_COMMUNITY): Payer: Self-pay | Admitting: Psychiatry

## 2024-03-05 ENCOUNTER — Other Ambulatory Visit: Payer: Self-pay | Admitting: Orthopedic Surgery

## 2024-03-05 DIAGNOSIS — G5791 Unspecified mononeuropathy of right lower limb: Secondary | ICD-10-CM

## 2024-03-06 ENCOUNTER — Telehealth (HOSPITAL_COMMUNITY): Payer: Self-pay | Admitting: *Deleted

## 2024-03-06 NOTE — Telephone Encounter (Signed)
 [facility] called office requesting refills for patient medications. [facility] has also sent in fax requesting refills for patient medications that provider prescribes for patient. Per provider to call patient and schedule a follow up appt due to not see patient since 08/27/2023. [facility] called again requesting refills for patient medication. Staff called patient to schedule follow up with patient and was not able to reach her. Both number on file do not work (invalid). [facility] called again requesting refills for patient medications and staff informed them to reach out to patient and have her call office. [facility] rep agreed and verbalized understanding.

## 2024-03-07 ENCOUNTER — Other Ambulatory Visit: Payer: Self-pay | Admitting: Orthopedic Surgery

## 2024-03-13 ENCOUNTER — Other Ambulatory Visit: Payer: Self-pay | Admitting: Radiology

## 2024-03-13 DIAGNOSIS — G5791 Unspecified mononeuropathy of right lower limb: Secondary | ICD-10-CM

## 2024-03-13 MED ORDER — CYCLOBENZAPRINE HCL 10 MG PO TABS: 10.0000 mg | ORAL_TABLET | Freq: Three times a day (TID) | ORAL | 1 refills | Status: DC

## 2024-03-13 MED ORDER — GABAPENTIN 100 MG PO CAPS: 100.0000 mg | ORAL_CAPSULE | Freq: Three times a day (TID) | ORAL | 2 refills | Status: AC

## 2024-03-13 MED ORDER — MELOXICAM 7.5 MG PO TABS: 7.5000 mg | ORAL_TABLET | Freq: Every day | ORAL | 8 refills | Status: AC

## 2024-03-13 NOTE — Telephone Encounter (Signed)
 Meds prompted, you will need to add sig.  Thanks.

## 2024-03-13 NOTE — Telephone Encounter (Signed)
 Pharmacy rep called, they had faxed a request to us  for the following meds.  Cyclobenzaprine , gabapentin , meloxicam .  Prompted for you to advise, thanks.

## 2024-03-19 ENCOUNTER — Other Ambulatory Visit: Payer: Self-pay | Admitting: Orthopedic Surgery

## 2024-04-10 NOTE — Progress Notes (Signed)
 Subjective:  Patient ID: Diane [name] is a 73 y.o. female.  Reason for visit:  Chief Complaint  Patient presents with  . Right Knee - Follow-up    Patient presents in office today for H&P     History of Present Illness:  HPI:  Associated symptoms include joint swelling. Patient denies excessive bruising.  Patient 73 year old female presenting today for follow-up evaluation right knee and H&P.  Patient has continued to be symptomatic in the right knee does post proceed with revision right total knee arthroplasty as previous discussed with Dr. Jama.  She notes she patient had a right total knee arthroplasty done in   in moved and is now moved to [place] .  She states she notes pain and instability in the right knee.  She notes this pain has gotten progressively worse over the last 2 years.  She has tried rest, activity modifications, oral anti-inflammatories and home physical therapy.  Patient describes pain as an ache with episodes stabbing shooting pains at times.  Patient notes increased activity, prolonged weightbearing, going up and out steps and getting up seated position increases pain.  History reviewed. No pertinent past medical history.   Past Surgical History:  Procedure Laterality Date  . HYSTERECTOMY    . OOPHORECTOMY      Social History   Socioeconomic History  . Marital status: Widowed    Spouse name: Not on file  . Number of children: Not on file  . Years of education: Not on file  . Highest education level: Not on file  Occupational History  . Not on file  Tobacco Use  . Smoking status: Never  . Smokeless tobacco: Never  Substance and Sexual Activity  . Alcohol  use: Never  . Drug use: Never  . Sexual activity: Not on file  Other Topics Concern  . Not on file  Social History Narrative  . Not on file   Social Drivers of Health   Financial Resource Strain: Low Risk  (12/13/2022)   Received from [facility]   Overall Financial Resource  Strain (CARDIA)   . Difficulty of Paying Living Expenses: Not very hard  Food Insecurity: No Food Insecurity (09/15/2022)   Received from [facility]   Hunger Vital Sign   . Worried About Programme researcher, broadcasting/film/video in the Last Year: Never true   . Ran Out of Food in the Last Year: Never true  Transportation Needs: No Transportation Needs (12/13/2022)   Received from [facility] - Transportation   . Lack of Transportation (Medical): No   . Lack of Transportation (Non-Medical): No  Physical Activity: Insufficiently Active (09/15/2022)   Received from [facility]   Exercise Vital Sign   . Days of Exercise per Week: 2 days   . Minutes of Exercise per Session: 60 min  Stress: No Stress Concern Present (01/24/2022)   Received from [facility] - Occupational Stress Questionnaire   . Feeling of Stress : Only a little  Social Connections: Socially Isolated (01/24/2022)   Received from [facility]   Social Connection and Isolation Panel [NHANES]   . Frequency of Communication with Friends and Family: Once a week   . Frequency of Social Gatherings with Friends and Family: Once a week   . Attends Religious Services: 1 to 4 times per year   . Active Member of Clubs or Organizations: No   . Attends Banker Meetings: Never   .  Marital Status: Separated  Intimate Partner Violence: Not At Risk (08/10/2022)   Received from [facility]   Humiliation, Afraid, Rape, and Kick questionnaire   . Fear of Current or Ex-Partner: No   . Emotionally Abused: No   . Physically Abused: No   . Sexually Abused: No  Housing Stability: Not on file    History reviewed. No pertinent family history.   Home Medications   Medication Sig Discontinued?  ALPRAZolam  (XANAX ) 0.5 mg tablet Take 1 tablet (0.5 mg total) by mouth 3 (three) times daily.   atorvastatin  (LIPITOR ) 40 mg tablet Take 1 tablet (40 mg total) by mouth daily.   azithromycin (ZITHROMAX) 250 mg  oral tablet Take 2 tabs by mouth (500 mg total) on Day 1, then 1 tab (250 mg) daily for Days 2-5   buPROPion  (WELLBUTRIN  XL) 150 mg 24 hr ER tablet Take 1 tablet (150 mg total) by mouth every morning.   cyclobenzaprine  (FLEXERIL ) 10 mg tablet Take 1 tablet (10 mg total) by mouth 3 (three) times daily as needed.   divalproex  (DEPAKOTE ) 500 mg DR tablet Take 1 tablet (500 mg total) by mouth daily.   gabapentin  (NEURONTIN ) 100 mg capsule Take 1 capsule (100 mg total) by mouth 3 (three) times daily.   levothyroxine  (SYNTHROID ) 125 mcg tablet Take 1 tablet (125 mcg total) by mouth every morning.   lisinopril  (ZESTRIL ) 10 mg tablet Take 1 tablet (10 mg total) by mouth nightly.   meloxicam  (MOBIC ) 7.5 mg tablet Take 1 tablet (7.5 mg total) by mouth daily.   methylPREDNISolone  (MEDROL ) 4 mg tablet dosepak Take as directed.   oxyBUTYnin  (DITROPAN  XL) 15 mg ER 24 hr tablet Take 2 tablets (30 mg total) by mouth daily.   oxyCODONE -acetaminophen  (PERCOCET) 10-325 mg tablet Take 1 tablet by mouth every 4 (four) hours as needed.     Allergies  Allergen Reactions  . Neomycin-Bacitracin Zn-Polymyx Other (See Comments) and Rash    Reaction: skin starts to become raw and peels off  . Penicillins Hives  . Sulfa Antibiotics Hives and Swelling    Height 5' 3.5 (1.613 m), weight 95.7 kg (211 lb).   Review of Systems:   Constitutional:  Negative for fever, chills and sweats.  HENT:  Negative for facial swelling and nosebleeds.   Eyes:  Negative for visual disturbance.  Respiratory:  Negative for shortness of breath and chest tightness.   Cardiovascular:  Negative for chest pain and leg swelling.  Musculoskeletal:  Positive for joint pain, difficulty walking, joint swelling and muscle pain.  Neurological:  Negative for dizziness, dizziness and headache.  Hematologic:  Negative for easy bleeding and bruising.  Psychiatric/Behavioral:  Negative for confusion, nervous/anxious and self-inflicted injury.    Skin:  Negative for change in color, rash/lesions and open wound.   following portions of the patient's history were reviewed during the patient's office visit today and updated as appropriate: allergies, current medications, past medical history, past surgical history, past familyhistory, past social history, problem list     Objective: Constitutional: No acute distress. Well nourished. Well developed. Eyes: Extra-ocular movements are grossly intact and scleraare anicteric. Respiratory: No labored breathing.  Clear to auscultation, no wheezes, rales, or rhonchi. Cardiovascular: No edema present.  Regular rate and rhythm, no murmur murmurs, rubs, or gallops. Skin: No impressive skin lesions present.  Neurological: No evidence of sensory loss in the affected area. Psychiatric:No unusual anxiety or evidence of depression.  General    Gait: antalgic Right Knee Exam  Range of Motion  Extension:  0  Flexion:  100   Tests  Varus: positive Valgus: positive  Other  Erythema: absent Scars: present Sensation: normal Pulse: present Swelling: mild   Left Knee Exam   Tenderness  The patient is experiencing tenderness in the lateral joint line and medial joint line.  Range of Motion  Extension:  -5  Flexion:  110   Tests  McMurray:  Medial - positive Lateral - positive Varus: negative Valgus: negative Lachman:  Anterior - negative    Posterior - negative Drawer:  Anterior - negative     Posterior - negative Pivot shift: negative Patellar apprehension: negative  Other  Erythema: absent Scars: absent Sensation: normal Pulse: present Swelling: mild Effusion: effusion present      Radiographs:  No results found.      Procedures          Assessment/Plan  Diagnoses and all orders for this visit:  1. Failed total knee, right, initial encounter -     CBC and Differential; Future -     Comprehensive Metabolic Panel; Future  2. 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 Pre-op testing -     CBC and Differential; Future -     Comprehensive Metabolic Panel; Future       Discussed pathoanatomy and physical exam with patient today.  Discussed with patient the risk benefits of a right total knee arthroplasty.  Patient notes they understands the risk benefits does wish proceed with surgery with Dr. Jama.  The risks and benefits of surgery were discussed at length with the patient. These included but were not limited to the following: Bleeding, infection, scarring, pain, fracture, implant failure/loosening, avascular necrosis, stiffness, lack of healing, need for further surgical intervention, damage to blood vessels and/or nerves which may result in temporary or permanent sensory or motor impairment, complex regional pain syndrome, lack of oxygen to organs, blood clots, thrombosis, pulmonary embolism, compartment syndrome, arthritis, blood transfusion complications, surgical and/or anesthetic death and lymphedema. Alternatives, surgical and nonsurgical have been reviewed with the patient as well. The consequences of not having surgery have been reviewed with the patient. They understand that ultimately, they may not return to their premorbid level. Understanding of all of the above, and not having any further questions, the patient agrees to go ahead with the procedure.  Patient still needs obtain clearance from PCP and repeat labs.  Patient follow-up with Dr. Jama postoperatively.  All patients questions were answered to there satisfaction today.  In evaluatingthis patient, I have accessed SCRIPTS ([place] Reporting & Identification Prescription Tracking System) and reviewed this patient's prior utilization of controlled substances in an effort to formulate an appropriateplan of  care for this patient.   Schimmoeller, Rockey BIRCH., PA   Disclaimers:This document may have been created using Dragon, templates, SmartPhrases and other technological help from the EHR. Mechele attempted to correct any erroneous information, but I am a physician assistant, not a document editor, so please pardon the possible bad grammar, spelling or editing errors or nonsense if you find any.

## 2024-04-26 ENCOUNTER — Other Ambulatory Visit: Payer: Self-pay | Admitting: Orthopedic Surgery

## 2024-05-07 ENCOUNTER — Other Ambulatory Visit (HOSPITAL_COMMUNITY): Payer: Self-pay | Admitting: Psychiatry

## 2024-06-18 ENCOUNTER — Other Ambulatory Visit: Payer: Self-pay | Admitting: Orthopedic Surgery

## 2024-07-14 ENCOUNTER — Encounter: Payer: Self-pay | Admitting: Radiology

## 2024-08-17 ENCOUNTER — Other Ambulatory Visit (HOSPITAL_COMMUNITY): Payer: Self-pay | Admitting: Psychiatry
